# Patient Record
Sex: Female | Born: 1970 | Race: White | Hispanic: No | Marital: Married | Smoking: Current some day smoker
Health system: Southern US, Community
[De-identification: ages and names within clinical notes are randomized; demographics above are authoritative.]

## PROBLEM LIST (undated history)

## (undated) DIAGNOSIS — R1012 Left upper quadrant pain: Secondary | ICD-10-CM

## (undated) DIAGNOSIS — G43909 Migraine, unspecified, not intractable, without status migrainosus: Secondary | ICD-10-CM

## (undated) DIAGNOSIS — K921 Melena: Secondary | ICD-10-CM

## (undated) DIAGNOSIS — K602 Anal fissure, unspecified: Secondary | ICD-10-CM

## (undated) DIAGNOSIS — E119 Type 2 diabetes mellitus without complications: Secondary | ICD-10-CM

## (undated) DIAGNOSIS — K859 Acute pancreatitis without necrosis or infection, unspecified: Secondary | ICD-10-CM

## (undated) DIAGNOSIS — R002 Palpitations: Secondary | ICD-10-CM

## (undated) DIAGNOSIS — N83209 Unspecified ovarian cyst, unspecified side: Secondary | ICD-10-CM

## (undated) DIAGNOSIS — I499 Cardiac arrhythmia, unspecified: Secondary | ICD-10-CM

## (undated) DIAGNOSIS — I10 Essential (primary) hypertension: Secondary | ICD-10-CM

## (undated) DIAGNOSIS — R51 Headache: Secondary | ICD-10-CM

## (undated) DIAGNOSIS — J45909 Unspecified asthma, uncomplicated: Secondary | ICD-10-CM

## (undated) DIAGNOSIS — K589 Irritable bowel syndrome without diarrhea: Secondary | ICD-10-CM

## (undated) DIAGNOSIS — F32A Depression, unspecified: Secondary | ICD-10-CM

## (undated) DIAGNOSIS — R0600 Dyspnea, unspecified: Secondary | ICD-10-CM

## (undated) DIAGNOSIS — R109 Unspecified abdominal pain: Secondary | ICD-10-CM

## (undated) DIAGNOSIS — I209 Angina pectoris, unspecified: Secondary | ICD-10-CM

## (undated) DIAGNOSIS — E785 Hyperlipidemia, unspecified: Secondary | ICD-10-CM

## (undated) HISTORY — DX: Type 2 diabetes mellitus without complications: E11.9

## (undated) HISTORY — DX: Essential (primary) hypertension: I10

## (undated) HISTORY — DX: Left upper quadrant pain: R10.12

## (undated) HISTORY — PX: ESOPHAGOGASTRODUODENOSCOPY: SHX5428

## (undated) HISTORY — DX: Irritable bowel syndrome, unspecified: K58.9

## (undated) HISTORY — DX: Palpitations: R00.2

## (undated) HISTORY — PX: BREAST REDUCTION SURGERY: SHX8

## (undated) HISTORY — DX: Melena: K92.1

## (undated) HISTORY — PX: COLONOSCOPY: SHX174

## (undated) HISTORY — DX: Unspecified asthma, uncomplicated: J45.909

## (undated) HISTORY — DX: Unspecified ovarian cyst, unspecified side: N83.209

## (undated) HISTORY — PX: CHOLECYSTECTOMY: SHX55

## (undated) HISTORY — PX: VAGINAL HYSTERECTOMY: SUR661

## (undated) HISTORY — DX: Migraine, unspecified, not intractable, without status migrainosus: G43.909

## (undated) HISTORY — DX: Anal fissure, unspecified: K60.2

## (undated) HISTORY — DX: Unspecified abdominal pain: R10.9

## (undated) HISTORY — DX: Headache: R51

---
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
## 1995-02-03 HISTORY — PX: REDUCTION MAMMAPLASTY: SUR839

## 1995-02-03 HISTORY — PX: BREAST EXCISIONAL BIOPSY: SUR124

## 2001-09-05 ENCOUNTER — Inpatient Hospital Stay (HOSPITAL_COMMUNITY): Admission: AD | Admit: 2001-09-05 | Discharge: 2001-09-07 | Payer: Self-pay | Admitting: *Deleted

## 2002-02-15 ENCOUNTER — Other Ambulatory Visit: Admission: RE | Admit: 2002-02-15 | Discharge: 2002-02-15 | Payer: Self-pay | Admitting: Obstetrics and Gynecology

## 2002-03-22 ENCOUNTER — Encounter (INDEPENDENT_AMBULATORY_CARE_PROVIDER_SITE_OTHER): Payer: Self-pay

## 2002-03-22 ENCOUNTER — Inpatient Hospital Stay (HOSPITAL_COMMUNITY): Admission: RE | Admit: 2002-03-22 | Discharge: 2002-03-25 | Payer: Self-pay | Admitting: Obstetrics and Gynecology

## 2007-04-04 ENCOUNTER — Emergency Department: Payer: Self-pay | Admitting: Emergency Medicine

## 2008-11-06 ENCOUNTER — Ambulatory Visit: Payer: Self-pay | Admitting: Family Medicine

## 2008-11-06 DIAGNOSIS — F17200 Nicotine dependence, unspecified, uncomplicated: Secondary | ICD-10-CM

## 2008-11-06 DIAGNOSIS — G43909 Migraine, unspecified, not intractable, without status migrainosus: Secondary | ICD-10-CM

## 2008-11-06 HISTORY — DX: Migraine, unspecified, not intractable, without status migrainosus: G43.909

## 2008-11-16 ENCOUNTER — Telehealth: Payer: Self-pay | Admitting: Family Medicine

## 2008-12-04 ENCOUNTER — Telehealth: Payer: Self-pay | Admitting: Family Medicine

## 2008-12-13 ENCOUNTER — Encounter: Payer: Self-pay | Admitting: Family Medicine

## 2008-12-13 ENCOUNTER — Ambulatory Visit: Payer: Self-pay | Admitting: Family Medicine

## 2008-12-13 ENCOUNTER — Other Ambulatory Visit: Admission: RE | Admit: 2008-12-13 | Discharge: 2008-12-13 | Payer: Self-pay | Admitting: Family Medicine

## 2008-12-13 DIAGNOSIS — J309 Allergic rhinitis, unspecified: Secondary | ICD-10-CM | POA: Insufficient documentation

## 2008-12-14 LAB — CONVERTED CEMR LAB
Bilirubin, Direct: 0 mg/dL (ref 0.0–0.3)
Calcium: 9.3 mg/dL (ref 8.4–10.5)
Chloride: 105 meq/L (ref 96–112)
Cholesterol: 223 mg/dL — ABNORMAL HIGH (ref 0–200)
Creatinine, Ser: 0.8 mg/dL (ref 0.4–1.2)
Direct LDL: 119.1 mg/dL
Total Bilirubin: 0.7 mg/dL (ref 0.3–1.2)
Total CHOL/HDL Ratio: 7
Triglycerides: 382 mg/dL — ABNORMAL HIGH (ref 0.0–149.0)
VLDL: 76.4 mg/dL — ABNORMAL HIGH (ref 0.0–40.0)

## 2008-12-18 ENCOUNTER — Encounter (INDEPENDENT_AMBULATORY_CARE_PROVIDER_SITE_OTHER): Payer: Self-pay | Admitting: *Deleted

## 2009-01-09 ENCOUNTER — Ambulatory Visit: Payer: Self-pay | Admitting: Family Medicine

## 2009-01-09 DIAGNOSIS — J019 Acute sinusitis, unspecified: Secondary | ICD-10-CM

## 2009-01-11 ENCOUNTER — Telehealth: Payer: Self-pay | Admitting: Family Medicine

## 2009-02-19 ENCOUNTER — Telehealth: Payer: Self-pay | Admitting: Family Medicine

## 2009-02-21 ENCOUNTER — Encounter: Payer: Self-pay | Admitting: Family Medicine

## 2009-02-21 ENCOUNTER — Ambulatory Visit: Payer: Self-pay | Admitting: Family Medicine

## 2009-02-21 ENCOUNTER — Ambulatory Visit: Payer: Self-pay | Admitting: Cardiology

## 2009-02-21 DIAGNOSIS — R109 Unspecified abdominal pain: Secondary | ICD-10-CM

## 2009-02-21 DIAGNOSIS — N83209 Unspecified ovarian cyst, unspecified side: Secondary | ICD-10-CM

## 2009-02-21 HISTORY — DX: Unspecified abdominal pain: R10.9

## 2009-02-21 HISTORY — DX: Unspecified ovarian cyst, unspecified side: N83.209

## 2009-02-25 ENCOUNTER — Encounter (INDEPENDENT_AMBULATORY_CARE_PROVIDER_SITE_OTHER): Payer: Self-pay | Admitting: *Deleted

## 2009-02-25 ENCOUNTER — Telehealth: Payer: Self-pay | Admitting: Family Medicine

## 2009-04-17 ENCOUNTER — Ambulatory Visit: Payer: Self-pay | Admitting: Gastroenterology

## 2009-04-17 DIAGNOSIS — R1012 Left upper quadrant pain: Secondary | ICD-10-CM

## 2009-04-17 DIAGNOSIS — K219 Gastro-esophageal reflux disease without esophagitis: Secondary | ICD-10-CM | POA: Insufficient documentation

## 2009-04-17 DIAGNOSIS — K921 Melena: Secondary | ICD-10-CM

## 2009-04-17 DIAGNOSIS — K5900 Constipation, unspecified: Secondary | ICD-10-CM | POA: Insufficient documentation

## 2009-04-17 HISTORY — DX: Melena: K92.1

## 2009-04-17 HISTORY — DX: Left upper quadrant pain: R10.12

## 2009-05-20 ENCOUNTER — Telehealth: Payer: Self-pay | Admitting: Gastroenterology

## 2009-06-27 ENCOUNTER — Ambulatory Visit: Payer: Self-pay | Admitting: Family Medicine

## 2009-06-27 LAB — CONVERTED CEMR LAB
CO2: 26 meq/L (ref 19–32)
Chloride: 103 meq/L (ref 96–112)
Sodium: 140 meq/L (ref 135–145)

## 2009-07-04 ENCOUNTER — Telehealth: Payer: Self-pay | Admitting: Family Medicine

## 2009-07-04 ENCOUNTER — Ambulatory Visit: Payer: Self-pay | Admitting: Internal Medicine

## 2009-07-10 ENCOUNTER — Telehealth: Payer: Self-pay | Admitting: Family Medicine

## 2010-01-15 ENCOUNTER — Telehealth: Payer: Self-pay | Admitting: Family Medicine

## 2010-01-16 ENCOUNTER — Ambulatory Visit: Payer: Self-pay | Admitting: Family Medicine

## 2010-01-21 ENCOUNTER — Encounter: Payer: Self-pay | Admitting: Internal Medicine

## 2010-01-22 ENCOUNTER — Telehealth: Payer: Self-pay | Admitting: Family Medicine

## 2010-01-28 ENCOUNTER — Telehealth: Payer: Self-pay | Admitting: Gastroenterology

## 2010-01-31 ENCOUNTER — Telehealth: Payer: Self-pay | Admitting: Family Medicine

## 2010-02-07 ENCOUNTER — Telehealth: Payer: Self-pay | Admitting: Family Medicine

## 2010-02-13 ENCOUNTER — Telehealth: Payer: Self-pay | Admitting: Family Medicine

## 2010-02-18 ENCOUNTER — Encounter (INDEPENDENT_AMBULATORY_CARE_PROVIDER_SITE_OTHER): Payer: Self-pay | Admitting: *Deleted

## 2010-02-21 ENCOUNTER — Ambulatory Visit
Admission: RE | Admit: 2010-02-21 | Discharge: 2010-02-21 | Payer: Self-pay | Source: Home / Self Care | Attending: Gastroenterology | Admitting: Gastroenterology

## 2010-02-21 ENCOUNTER — Telehealth: Payer: Self-pay | Admitting: Family Medicine

## 2010-02-23 ENCOUNTER — Encounter: Payer: Self-pay | Admitting: Family Medicine

## 2010-02-28 ENCOUNTER — Other Ambulatory Visit: Payer: Self-pay | Admitting: Gastroenterology

## 2010-02-28 ENCOUNTER — Ambulatory Visit
Admission: RE | Admit: 2010-02-28 | Discharge: 2010-02-28 | Payer: Self-pay | Source: Home / Self Care | Attending: Gastroenterology | Admitting: Gastroenterology

## 2010-02-28 ENCOUNTER — Telehealth: Payer: Self-pay | Admitting: Gastroenterology

## 2010-02-28 DIAGNOSIS — K625 Hemorrhage of anus and rectum: Secondary | ICD-10-CM

## 2010-02-28 DIAGNOSIS — R109 Unspecified abdominal pain: Secondary | ICD-10-CM

## 2010-03-06 ENCOUNTER — Encounter: Payer: Self-pay | Admitting: Gastroenterology

## 2010-03-06 NOTE — Miscellaneous (Signed)
Summary: gi medication  Clinical Lists Changes  Medications: Added new medication of GLYCOPYRROLATE 1 MG  TABS (GLYCOPYRROLATE) by mouth two times a day as needed abd pain/bloating - Signed Added new medication of OMEPRAZOLE 40 MG  CPDR (OMEPRAZOLE) 1 each day 30 minutes before meal - Signed Rx of GLYCOPYRROLATE 1 MG  TABS (GLYCOPYRROLATE) by mouth two times a day as needed abd pain/bloating;  #68 x 11;  Signed;  Entered by: Eual Fines RN;  Authorized by: Meryl Dare MD [facility];  Method used: [id number] [address], [place], [place]  [address], Ph: [phone] or [phone], Fax: [phone] Rx of OMEPRAZOLE 40 MG  CPDR (OMEPRAZOLE) 1 each day 30 minutes before meal;  #34 x 11;  Signed;  Entered by: Eual Fines RN;  Authorized by: Meryl Dare MD [facility];  Method used: [id number] [address], [place], [place]  [address], Ph: [phone] or [phone], Fax: [phone] Observations: Added new observation of ALLERGY REV: Done (02/28/2010 14:42)    Prescriptions: OMEPRAZOLE 40 MG  CPDR (OMEPRAZOLE) 1 each day 30 minutes before meal  #34 x 11   Entered by:   Eual Fines RN   Authorized by:   Meryl Dare MD [facility]   Signed by:   Eual Fines RN on 02/28/2010   Method used:   [facility]        [id number] ([facility]ail[facility]       [address]       [place], [place]  [address]       Ph: [phone] or [phone]       Fax: [phone]   RxID:   [id number] GLYCOPYRROLATE 1 MG  TABS (GLYCOPYRROLATE) by mouth two times a day as needed abd pain/bloating  #68 x 11   Entered by:   Eual Fines RN   Authorized by:   Meryl Dare MD [facility]   Signed by:   Eual Fines RN on 02/28/2010   Method used:   [facility]        [id number] ([facility]ail[facility]       [address]       [place],  [place]  [address]       Ph: [phone] or [phone]       Fax: [phone]   RxID:   [id number]

## 2010-03-06 NOTE — Progress Notes (Signed)
Summary: vicodin   Phone Note Refill Request Message from:  Patient on February 07, 2010 3:42 PM  Refills Requested: Medication #1:  VICODIN 5-500 MG TABS 1 tab by mouth every 6 hours as needed for pain. Patient only has one pill left and says that she can't wait until monday when Dr. Dayton Martes returns on Monday. [facility] [address]   Initial call taken by: Melody Comas,  February 07, 2010 3:43 PM  Follow-up for Phone Call        Rx called to pharmacy.  Patient notified via telephone. Follow-up by: Linde Gillis CMA Duncan Dull),  February 07, 2010 4:43 PM    Prescriptions: VICODIN 5-500 MG TABS (HYDROCODONE-ACETAMINOPHEN) 1 tab by mouth every 6 hours as needed for pain  #30 x 0   Entered and Authorized by:   Ruthe Mannan MD   Signed by:   Ruthe Mannan MD on 02/07/2010   Method used:   Telephoned to ...       [id number] ([facility]ail[facility]       [address]       [place], [place]  [address]       Ph: [phone] or [phone]       Fax: [phone]   RxID:   [id number]

## 2010-03-06 NOTE — Letter (Signed)
Summary: [facility] Instructions  [facility]  [address] [place], [place] [address]   Phone: [phone]  Fax: [phone]       Kelly Rollins    14-Dec-1970    MRN: [id number]        Procedure Day /Date:  Friday 02/28/2010      Arrival Time:  1:00 pm     Procedure Time: 2:00 pm     Location of Procedure:                    _x _  [facility] (4th Floor)                        PREPARATION FOR COLONOSCOPY WITH MOVIPREP   Starting 5 days prior to your procedure Sunday 1/22 do not eat nuts, seeds, popcorn, corn, beans, peas,  salads, or any raw vegetables.  Do not take any fiber supplements (e.g. Metamucil, Citrucel, and Benefiber).  THE DAY BEFORE YOUR PROCEDURE         DATE: Thursday 1/26  1.  Drink clear liquids the entire day-NO SOLID FOOD  2.  Do not drink anything colored red or purple.  Avoid juices with pulp.  No orange juice.  3.  Drink at least 64 oz. (8 glasses) of fluid/clear liquids during the day to prevent dehydration and help the prep work efficiently.  CLEAR LIQUIDS INCLUDE: Water Jello Ice Popsicles Tea (sugar ok, no milk/cream) Powdered fruit flavored drinks Coffee (sugar ok, no milk/cream) Gatorade Juice: apple, white grape, white cranberry  Lemonade Clear bullion, consomm, broth Carbonated beverages (any kind) Strained chicken noodle soup Hard Candy                             4.  In the morning, mix first dose of MoviPrep solution:    Empty 1 Pouch A and 1 Pouch B into the disposable container    Add lukewarm drinking water to the top line of the container. Mix to dissolve    Refrigerate (mixed solution should be used within 24 hrs)  5.  Begin drinking the prep at 5:00 p.m. The MoviPrep container is divided by 4 marks.   Every 15 minutes drink the solution down to the next mark (approximately 8 oz) until the full liter is complete.   6.  Follow completed prep with 16 oz of clear liquid of your choice  (Nothing red or purple).  Continue to drink clear liquids until bedtime.  7.  Before going to bed, mix second dose of MoviPrep solution:    Empty 1 Pouch A and 1 Pouch B into the disposable container    Add lukewarm drinking water to the top line of the container. Mix to dissolve    Refrigerate  THE DAY OF YOUR PROCEDURE      DATE: Friday 1/27  Beginning at 9:00 a.m. (5 hours before procedure):         1. Every 15 minutes, drink the solution down to the next mark (approx 8 oz) until the full liter is complete.  2. Follow completed prep with 16 oz. of clear liquid of your choice.    3. You may drink clear liquids until 12:00 pm (2 HOURS BEFORE PROCEDURE).   MEDICATION INSTRUCTIONS  Unless otherwise instructed, you should take regular prescription medications with a small sip of water   as early as possible the morning  of your procedure.           OTHER INSTRUCTIONS  You will need a responsible adult at least 39 years of age to accompany you and drive you home.   This person must remain in the waiting room during your procedure.  Wear loose fitting clothing that is easily removed.  Leave jewelry and other valuables at home.  However, you may wish to bring a book to read or  an iPod/MP3 player to listen to music as you wait for your procedure to start.  Remove all body piercing jewelry and leave at home.  Total time from sign-in until discharge is approximately 2-3 hours.  You should go home directly after your procedure and rest.  You can resume normal activities the  day after your procedure.  The day of your procedure you should not:   Drive   Make legal decisions   Operate machinery   Drink alcohol   Return to work  You will receive specific instructions about eating, activities and medications before you leave.    The above instructions have been reviewed and explained to me by   Ezra Sites RN  February 21, 2010 8:26 AM     I fully understand  and can verbalize these instructions _____________________________ Date _________

## 2010-03-06 NOTE — Letter (Signed)
Summary: New Patient letter  [facility]  [address] [place], [place] [address]   Phone: [phone]  Fax: [phone]       02/25/2009 MRN: [id number]  [facility] [address] [place], [place]  [address]  Dear Ms. Landress,  Welcome to the Gastroenterology Division at [facility].    You are scheduled to see Dr.  Russella Dar on 03-21-09 at 9:30AM on the 3rd floor at [facility], [address]  We ask that you try to arrive at our office 15 minutes prior to your appointment time to allow for check-in.  We would like you to complete the enclosed self-administered evaluation form prior to your visit and bring it with you on the day of your appointment.  We will review it with you.  Also, please bring a complete list of all your medications or, if you prefer, bring the medication bottles and we will list them.  Please bring your insurance card so that we may make a copy of it.  If your insurance requires a referral to see a specialist, please bring your referral form from your primary care physician.  Co-payments are due at the time of your visit and may be paid by cash, check or credit card.     Your office visit will consist of a consult with your physician (includes a physical exam), any laboratory testing he/she may order, scheduling of any necessary diagnostic testing (e.g. x-ray, ultrasound, CT-scan), and scheduling of a procedure (e.g. Endoscopy, Colonoscopy) if required.  Please allow enough time on your schedule to allow for any/all of these possibilities.    If you cannot keep your appointment, please call [phone] to cancel or reschedule prior to your appointment date.  This allows [place] the opportunity to schedule an appointment for another patient in need of care.  If you do not cancel or reschedule by 5 p.m. the business day prior to your appointment date, you will be charged a $50.00 late cancellation/no-show fee.    Thank you for  choosing [facility] for your medical needs.  We appreciate the opportunity to care for you.  Please visit [place] at our website  to learn more about our practice.                     Sincerely,                                                             The Gastroenterology Division

## 2010-03-06 NOTE — Assessment & Plan Note (Signed)
Summary: ABD PAIN/CLE   Vital Signs:  Patient profile:   40 year old female Height:      64 inches Weight:      167.13 pounds BMI:     28.79 Temp:     97.7 degrees F oral Pulse rate:   92 / minute Pulse rhythm:   regular BP sitting:   128 / 92  (left arm) Cuff size:   regular  Vitals Entered By: Delilah Shan CMA (AAMA) (February 21, 2009 8:16 AM) CC: Abdominal pain x 3-4 weeks.   History of Present Illness: 40 yo with 2 weeks of severe constipation and abdominal pain. Has been taking MOM, Miralax, and stool softeners, finally had very large BM two days ago but still having severe 8/10 abdominal pain. Pain is mainly LUQ that radiates to LLQ but sometimes radiates to RLQ. She is s/p cholecystectomy. Also has h/o endometriosis with probable surgical abdominal adhesions. Has had some associated nausea, no vomiting. No fevers, chills, blood or mucous in stool. Stools are now loose.  Never had anything like tihs before.  Standing makes her feel better, while sitting in chair makes her feel worse.  Current Medications (verified): 1)  Excedrin Extra Strength 250-250-65 Mg Tabs (Aspirin-Acetaminophen-Caffeine) .... As Needed  - Usually Daily 2)  Multivitamins   Tabs (Multiple Vitamin) .... Take 1 Tablet By Mouth Once A Day 3)  Vitamin D 400 Unit  Tabs (Cholecalciferol) .... Take 1 Tablet By Mouth Once A Day 4)  Vitamin B Complex-C   Caps (B Complex-C) .... Take 1 Tablet By Mouth Once A Day 5)  Veramyst 27.5 Mcg/spray Susp (Fluticasone Furoate) .... 2 Spray Each Nostril Daily. 6)  Claritin 10 Mg Tabs (Loratadine) .Marland Kitchen.. 1 Tab Daily. 7)  Ventolin Hfa 108 (90 Base) Mcg/act Aers (Albuterol Sulfate) .... 2  Puff Every 4-6 Hours As Needed  For Wheezing  Allergies: 1)  ! Penicillin 2)  ! Morphine  Review of Systems      See HPI General:  Denies chills and fever. GI:  Complains of abdominal pain, constipation, and nausea; denies bloody stools, dark tarry stools, and vomiting.  Physical  Exam  General:  Well-developed,well-nourished,in no acute distress; alert,appropriate and cooperative throughout examination. Mouth:  Oral mucosa and oropharynx without lesions or exudates.  Teeth in good repair. Abdomen:  Bowel sounds positive,abdomen soft. TTP over LUQ, otherwise non tender. Murphy's sign neg Psych:  talkative, normally interactive, good eye contact, not anxious appearing, and not depressed appearing.     Impression & Recommendations:  Problem # 1:  ABDOMINAL PAIN OTHER SPECIFIED SITE (ICD-789.09) Assessment New Differential wide, including partial bowel obstruction, diverticulosis/diverticulitis, referred pain (i.e appendix), IBS.  Marland Kitchen  Do not feel Ultrasound will be beneficial given location and wide area of pain. Will send for CT with contrast of Abd/Pelvis. Her updated medication list for this problem includes:    Excedrin Extra Strength 250-250-65 Mg Tabs (Aspirin-acetaminophen-caffeine) .Marland Kitchen... As needed  - usually daily  Orders: Radiology Referral (Radiology)  Complete Medication List: 1)  Excedrin Extra Strength 250-250-65 Mg Tabs (Aspirin-acetaminophen-caffeine) .... As needed  - usually daily 2)  Multivitamins Tabs (Multiple vitamin) .... Take 1 tablet by mouth once a day 3)  Vitamin D 400 Unit Tabs (Cholecalciferol) .... Take 1 tablet by mouth once a day 4)  Vitamin B Complex-c Caps (B complex-c) .... Take 1 tablet by mouth once a day 5)  Veramyst 27.5 Mcg/spray Susp (Fluticasone furoate) .... 2 spray each nostril daily. 6)  Claritin 10  Mg Tabs (Loratadine) .Marland Kitchen.. 1 tab daily. 7)  Ventolin Hfa 108 (90 Base) Mcg/act Aers (Albuterol sulfate) .... 2  puff every 4-6 hours as needed  for wheezing  Patient Instructions: 1)  Great to see you, Victorino Dike. 2)  Please stop by to see Shirlee Limerick on your way out to set up your CT scan.  Current Allergies (reviewed today): ! PENICILLIN ! MORPHINE

## 2010-03-06 NOTE — Progress Notes (Signed)
Summary: wants order for ultrasound  Phone Note Call from Patient Call back at Home Phone [phone]   Caller: Patient Call For: Ruthe Mannan MD Summary of Call: Patient says that she has still been having incredible abdominal pain, today it is really bad. She says that about a year ago she was supposed to go and have an ultrasound done, but wasnt' able to do so because they wanted money upfront and she didn't have it at the time. She has now found a place that is not going to charge her up front. She is asking if you would be willing to send an order to [facility] for her to have this ultrasound done because she is in a great deal of pain this morning and they told her if they had the order faxed to them that they would see her today. Their fax is [phone]. She is also asking if she could possibly get something for the [facility]. Please advise.  Initial call taken by: Melody Comas,  January 15, 2010 8:49 AM  Follow-up for Phone Call        I will place order for pelvic ultrasound as we discussed in May but I cannot call in anything for pain without seeing her.  For abdominal pain, you cannot just treat with pain medication without knowing the cause as you can actually make things worse. Ruthe Mannan MD  January 15, 2010 8:53 AM  Order faxed to [facility]. Patient has an appt to see Dr. Dayton Martes tomorrow morning. Follow-up by: Melody Comas,  January 15, 2010 8:59 AM

## 2010-03-06 NOTE — Progress Notes (Signed)
Summary: Reschedule colonoscopy  Phone Note Call from Patient   Caller: Patient Call For: Dr. Russella Dar Reason for Call: Talk to Nurse Summary of Call: Pt r/s her colonoscopy from 05-21-09 til 07-03-09 b/c she was thinking her appt. was next week. Would you like this pt. charged the cancelation fee? Initial call taken by: Karna Christmas,  May 20, 2009 8:27 AM  Follow-up for Phone Call        05-22-09 We will waive the fee this time, but she will need to cancel with the proper amount of time or she will be charged next time. Follow-up by: Zackery Barefoot,  May 22, 2009 10:34 AM  Additional Follow-up for Phone Call Additional follow up Details #1::        Patient NOT BILLED. Additional Follow-up by: Leanor Kail [facility],  Jun 05, 2009 9:22 AM

## 2010-03-06 NOTE — Assessment & Plan Note (Signed)
Summary: ABD PAIN/YF   History of Present Illness Visit Type: consult Primary GI MD: Elie Goody MD [facility] Primary Provider: Ruthe Mannan, MD Requesting Provider: Ruthe Mannan, MD Chief Complaint: abdominal pain with alternating diarrhea and constipation History of Present Illness:   This is a 40 year old female who relates a several month history of left upper quadrant abdominal pain that occasionally radiates to her left lower quadrant and left flank. Her symptoms are associated with bloating and constipation. She will have phases of constipation and phases of diarrhea. She notes small amounts of rectal bleeding on occasion. She also relates intermittent problems with nausea and vomiting. She has frequent acid reflux problems.   She had an abd and pelvic CT performed that was unremarkable except for a right adnexal cyst. Recent blood work was unremarkable except for a minimally elevated ALT at 36.   GI Review of Systems    Reports abdominal pain, acid reflux, bloating, nausea, and  vomiting.     Location of  Abdominal pain: LUQ.    Denies belching, chest pain, dysphagia with liquids, dysphagia with solids, heartburn, loss of appetite, vomiting blood, weight loss, and  weight gain.      Reports constipation, diarrhea, irritable bowel syndrome, and  rectal bleeding.     Denies anal fissure, black tarry stools, change in bowel habit, diverticulosis, fecal incontinence, heme positive stool, hemorrhoids, jaundice, light color stool, liver problems, and  rectal pain.   Current Medications (verified): 1)  Excedrin Extra Strength 250-250-65 Mg Tabs (Aspirin-Acetaminophen-Caffeine) .... As Needed  - Usually Daily 2)  Multivitamins   Tabs (Multiple Vitamin) .... Take 1 Tablet By Mouth Once A Day 3)  Vitamin D 400 Unit  Tabs (Cholecalciferol) .... Take 1 Tablet By Mouth Once A Day 4)  Vitamin B Complex-C   Caps (B Complex-C) .... Take 1 Tablet By Mouth Once A Day 5)  Claritin 10 Mg Tabs  (Loratadine) .Marland Kitchen.. 1 Tab Daily. 6)  Stool Softener 100 Mg Caps (Docusate Sodium) .... As Needed 7)  Milk of Magnesia 400 Mg/53ml Susp (Magnesium Hydroxide) .... As Needed  Allergies (verified): 1)  ! Penicillin 2)  ! Morphine  Past History:  Past Medical History: Headache G3P3 Anal Fissure Hypertension Irritable Bowel Syndrome Right adenexal cyst  Past Surgical History: Caesarean section Hysterectomy 2004 Cholecystectomy 2001 Bilateral breast reduction 1997  Family History: Mom -alive,  had throat CA in 40s, HLD, HTN as well Dad- alive,  CAD s/p 2 stents, MI at 64, HLD, HTN  Social History: LIves with husband, 3 daughters (46, 42, and 7) and 3 step sons.   Current Smoker Alcohol use-yes Regular exercise-no Self employed as a Designer, industrial/product. Daily Caffeine Use 1-2   Review of Systems       The patient complains of allergy/sinus and urine leakage.         The pertinent positives and negatives are noted as above and in the HPI. All other ROS were reviewed and were negative.   Vital Signs:  Patient profile:   40 year old female Height:      64 inches Weight:      168 pounds BMI:     28.94 Pulse rate:   64 / minute Pulse rhythm:   regular BP sitting:   126 / 82  (left arm) Cuff size:   regular  Vitals Entered By: Francee Piccolo CMA Duncan Dull) (April 17, 2009 3:50 PM)  Physical Exam  General:  Well developed, well nourished, no acute distress.  Head:  Normocephalic and atraumatic. Eyes:  PERRLA, no icterus. Ears:  Normal auditory acuity. Mouth:  No deformity or lesions, dentition normal. Neck:  Supple; no masses or thyromegaly. Lungs:  Clear throughout to auscultation. Heart:  Regular rate and rhythm; no murmurs, rubs,  or bruits. Abdomen:  Soft, nontender and nondistended. No masses, hepatosplenomegaly or hernias noted. Normal bowel sounds. Rectal:  deferred until time of colonoscopy.   Msk:  Symmetrical with no gross deformities. Normal  posture. Pulses:  Normal pulses noted. Extremities:  No clubbing, cyanosis, edema or deformities noted. Neurologic:  Alert and  oriented x4;  grossly normal neurologically. Cervical Nodes:  No significant cervical adenopathy. Inguinal Nodes:  No significant inguinal adenopathy. Psych:  Alert and cooperative. Normal mood and affect.  Impression & Recommendations:  Problem # 1:  ABDOMINAL PAIN-LUQ (ICD-789.02) Symptom conmplex is consistent with irritable bowel syndrome. Rule out underlying inflammatory bowel disease is less likely colorectal neoplasms. Begin Levsin to sublingually q.i.d. as needed.  Problem # 2:  HEMATOCHEZIA (ICD-578.1) Small-volume hematochezia. Rule out hemorrhoids fissure, IBD and colorectal neoplasms. The risks, benefits and alternatives to colonoscopy with possible biopsy and possible polypectomy were discussed with the patient and they consent to proceed. The procedure will be scheduled electively.  Problem # 3:  GERD (ICD-530.81) Probable GERD. Prilosec 20 mg OTC she a.m. and standard antireflux measures. The risks, benefits and alternatives to endoscopy with possible biopsy and possible dilation were discussed with the patient and they consent to proceed. The procedure will be scheduled electively.  Problem # 4:  CONSTIPATION (ICD-564.00) Begin a high-fiber diet with liberal fluid intake. MiraLax once or twice daily, titrated for adequate bowel movements  Other Orders: Colon/Endo (Colon/Endo)  Patient Instructions: 1)  You have been scheduled for your endoscopy and colonoscopy on 05/21/09 @ 3:30 pm. 2)  Please follow a high fiber diet. A brochure has been given to you. 3)  Avoid foods high in acid content ( tomatoes, citrus juices, spicy foods) . Avoid eating within 3 to 4 hours of lying down or before exercising. Do not over eat; try smaller more frequent meals. Elevate head of bed four inches when sleeping.  4)  Take Prilosec OTC which can be gotten without a  prescription. 5)  Pick up Levsin SL at the pharmacy which has been sent as a prescription in addition to your other prescription. 6)  Take Miralax 1 capful dissolved in at least 8 ounces of water or juice and drink once to twice daily (titrate as needed). This can be gotten over the counter. 7)  Copy sent to : Enos Fling, MD 8)  The medication list was reviewed and reconciled.  All changed / newly prescribed medications were explained.  A complete medication list was provided to the patient / caregiver.  Prescriptions: LEVSIN/SL 0.125 MG SUBL (HYOSCYAMINE SULFATE) Dissolve 1 tablet under the tongue as four time daily as needed for abdominal pain  #60 x 5   Entered by:   Christie Nottingham CMA (AAMA)   Authorized by:   Meryl Dare MD [facility]   Signed by:   Christie Nottingham CMA (AAMA) on 04/17/2009   Method used:   [id number]       [address]       [place], [place]  [address]       Ph: [phone] or [phone]  Fax: [phone]   RxID:   [id number] MOVIPREP 100 GM  SOLR (PEG-KCL-NACL-NASULF-NA ASC-C) As per prep instructions.  #1 x 0   Entered by:   Christie Nottingham CMA (AAMA)   Authorized by:   Meryl Dare MD [facility]   Signed by:   Christie Nottingham CMA (AAMA) on 04/17/2009   Method used:   [id number]       [address]       [place], [place]  [address]       Ph: [phone] or [phone]       Fax: [phone]   RxID:   [id number]

## 2010-03-06 NOTE — Miscellaneous (Signed)
Summary: Orders Update  Clinical Lists Changes  Problems: Added new problem of OVARIAN CYST, RIGHT (ICD-620.2) Orders: Added new Referral order of Radiology Referral (Radiology) - Signed

## 2010-03-06 NOTE — Letter (Signed)
Summary: [facility] Instructions  [facility]  [address] [place], [place] [address]   Phone: [phone]  Fax: [phone]       Kelly Rollins    [birth year]    MRN: [id number]        Procedure Day /Date: 05/21/09 (Tuesday)     Arrival Time: 2:30 pm     Procedure Time: 3:30 pm     Location of Procedure:                    _ x_  [facility] (4th Floor)         PREPARATION FOR COLONOSCOPY WITH MOVIPREP   Starting 5 days prior to your procedure 05/14/09 do not eat nuts, seeds, popcorn, corn, beans, peas,  salads, or any raw vegetables.  Do not take any fiber supplements (e.g. Metamucil, Citrucel, and Benefiber).  THE DAY BEFORE YOUR PROCEDURE         DATE: 05/20/09  DAY: Sunday  1.  Drink clear liquids the entire day-NO SOLID FOOD  2.  Do not drink anything colored red or purple.  Avoid juices with pulp.  No orange juice.  3.  Drink at least 64 oz. (8 glasses) of fluid/clear liquids during the day to prevent dehydration and help the prep work efficiently.  CLEAR LIQUIDS INCLUDE: Water Jello Ice Popsicles Tea (sugar ok, no milk/cream) Powdered fruit flavored drinks Coffee (sugar ok, no milk/cream) Gatorade Juice: apple, white grape, white cranberry  Lemonade Clear bullion, consomm, broth Carbonated beverages (any kind) Strained chicken noodle soup Hard Candy                             4.  In the morning, mix first dose of MoviPrep solution:    Empty 1 Pouch A and 1 Pouch B into the disposable container    Add lukewarm drinking water to the top line of the container. Mix to dissolve    Refrigerate (mixed solution should be used within 24 hrs)  5.  Begin drinking the prep at 5:00 p.m. The MoviPrep container is divided by 4 marks.   Every 15 minutes drink the solution down to the next mark (approximately 8 oz) until the full liter is complete.   6.  Follow completed prep with 16 oz of clear liquid of your choice (Nothing red or  purple).  Continue to drink clear liquids until bedtime.  7.  Before going to bed, mix second dose of MoviPrep solution:    Empty 1 Pouch A and 1 Pouch B into the disposable container    Add lukewarm drinking water to the top line of the container. Mix to dissolve    Refrigerate  THE DAY OF YOUR PROCEDURE      DATE: 05/21/09 DAY: Monday  Beginning at  10:30 a.m. (5 hours before procedure):         1. Every 15 minutes, drink the solution down to the next mark (approx 8 oz) until the full liter is complete.  2. Follow completed prep with 16 oz. of clear liquid of your choice.    3. You may drink clear liquids until 1:30 am (2 HOURS BEFORE PROCEDURE).   MEDICATION INSTRUCTIONS  Unless otherwise instructed, you should take regular prescription medications with a small sip of water   as early as possible the morning of your procedure.        OTHER INSTRUCTIONS  You will  need a responsible adult at least 39 years of age to accompany you and drive you home.   This person must remain in the waiting room during your procedure.  Wear loose fitting clothing that is easily removed.  Leave jewelry and other valuables at home.  However, you may wish to bring a book to read or  an iPod/MP3 player to listen to music as you wait for your procedure to start.  Remove all body piercing jewelry and leave at home.  Total time from sign-in until discharge is approximately 2-3 hours.  You should go home directly after your procedure and rest.  You can resume normal activities the  day after your procedure.  The day of your procedure you should not:   Drive   Make legal decisions   Operate machinery   Drink alcohol   Return to work  You will receive specific instructions about eating, activities and medications before you leave.    The above instructions have been reviewed and explained to me by   Christie Nottingham CMA Duncan Dull)  April 17, 2009 4:33 PM     I fully understand and  can verbalize these instructions _____________________________ Date 3/16/11_

## 2010-03-06 NOTE — Miscellaneous (Signed)
Summary: [facility]  Clinical Lists Changes  Medications: Added new medication of MOVIPREP 100 GM  SOLR (PEG-KCL-NACL-NASULF-NA ASC-C) As per prep instructions. - Signed Rx of MOVIPREP 100 GM  SOLR (PEG-KCL-NACL-NASULF-NA ASC-C) As per prep instructions.;  #1 x 0;  Signed;  Entered by: Ezra Sites RN;  Authorized by: Meryl Dare MD [facility];  Method used: [id number] [address] [place], [place]  [address], Ph: [phone] or [phone], Fax: [phone] Observations: Added new observation of ALLERGY REV: Done (02/21/2010 7:57)    Prescriptions: MOVIPREP 100 GM  SOLR (PEG-KCL-NACL-NASULF-NA ASC-C) As per prep instructions.  #1 x 0   Entered by:   Ezra Sites RN   Authorized by:   Meryl Dare MD [facility]   Signed by:   Ezra Sites RN on 02/21/2010   Method used:   [facility]        [id number] ([facility]ail[facility]       [address]       [place], [place]  [address]       Ph: [phone] or [phone]       Fax: [phone]   RxID:   [id number]

## 2010-03-06 NOTE — Progress Notes (Signed)
Summary: refill request for vicodin  Phone Note Refill Request Message from:  Fax from Pharmacy  Refills Requested: Medication #1:  VICODIN 5-500 MG TABS 1 tab by mouth every 6 hours as needed for pain.   Last Refilled: 06/27/2009 [facility] [address], [phone].  Initial call taken by: Lowella Petties CMA,  July 10, 2009 4:17 PM    Prescriptions: VICODIN 5-500 MG TABS (HYDROCODONE-ACETAMINOPHEN) 1 tab by mouth every 6 hours as needed for pain  #30 x 0   Entered and Authorized by:   Ruthe Mannan MD   Signed by:   Ruthe Mannan MD on 07/10/2009   Method used:   Telephoned to ...       [id number] ([facility]ail[facility]       [address]       [place], [place]  [address]       Ph: [phone] or [phone]       Fax: [phone]   RxID:   [id number]   Appended Document: refill request for vicodin Rx called to pharmacy.

## 2010-03-06 NOTE — Progress Notes (Signed)
Summary: wants refill on vicodin  Phone Note Call from Patient Call back at Home Phone [phone]   Caller: Patient Summary of Call: Advised pt of ultrasound results.  She is going to run out of vicodin by the end of the week and is asking if a refill can be sent to [facility]. Initial call taken by: Lowella Petties CMA, AAMA,  January 22, 2010 3:35 PM  Follow-up for Phone Call        Rx Called In Follow-up by: Linde Gillis CMA Duncan Dull),  January 23, 2010 8:35 AM    Prescriptions: VICODIN 5-500 MG TABS (HYDROCODONE-ACETAMINOPHEN) 1 tab by mouth every 6 hours as needed for pain  #30 x 0   Entered and Authorized by:   Ruthe Mannan MD   Signed by:   Ruthe Mannan MD on 01/23/2010   Method used:   Telephoned to ...       [id number]       [address]       [place], [place]  [address]       Ph: [phone] or [phone]       Fax: [phone]   RxID:   [id number]

## 2010-03-06 NOTE — Assessment & Plan Note (Signed)
Summary: STOMACH/CLE   Vital Signs:  Patient profile:   40 year old female Height:      64 inches Weight:      172.75 pounds BMI:     29.76 Temp:     97.6 degrees F oral Pulse rate:   72 / minute Pulse rhythm:   regular BP sitting:   130 / 90  (left arm) Cuff size:   regular  Vitals Entered By: Linde Gillis CMA Duncan Dull) (January 16, 2010 8:07 AM) CC: stomach issues   History of Present Illness: 40 yo here to discuss abdominal pain.  Was seen in January for severe constipation and abdominal pain. Pain was mainly LUQ that radiates to LLQ but sometimes radiates to RLQ. She is s/p cholecystectomy. Also has h/o endometriosis with probable surgical abdominal adhesions. Has had some associated nausea, no vomiting. No fevers, chills, blood or mucous in stool. Referred to GI, felt symptoms consistent with IBS.  Given Bentyl to use as needed. Colonscopy scheduled for June 3rd but she cancelled it because was no longer having changes in her stool.    CT of adomen showed right ovarian cystic structure, was supposed to have pelvic ultrasound but had to cancel  it due to cost.  Also showed left subpleural nodularity, felt to be lymph node.  Was doing ok, had intermittent postprandial epigastric discomfort for last several months but this week, pain became acute again, exactly they way it was this summer.  Mainly LUQ and LLG. Feels at times she can palpate a hard mass in her LUQ.  No longer having issues with constipation.  Pain is worse sitting.  Pelvic and abdominal ultrasound scheduled for this Tuesday.  Current Medications (verified): 1)  Multivitamins   Tabs (Multiple Vitamin) .... Take 1 Tablet By Mouth Once A Day 2)  Vicodin 5-500 Mg Tabs (Hydrocodone-Acetaminophen) .Marland Kitchen.. 1 Tab By Mouth Every 6 Hours As Needed For Pain  Allergies: 1)  ! Penicillin 2)  ! Morphine  Past History:  Past Medical History: Last updated: 04/17/2009 Headache G3P3 Anal Fissure Hypertension Irritable  Bowel Syndrome Right adenexal cyst  Past Surgical History: Last updated: 04/17/2009 Caesarean section Hysterectomy 2004 Cholecystectomy 2001 Bilateral breast reduction 1997  Family History: Last updated: 04/17/2009 Mom -alive,  had throat CA in 40s, HLD, HTN as well Dad- alive,  CAD s/p 2 stents, MI at 40, HLD, HTN  Social History: Last updated: 04/17/2009 LIves with husband, 3 daughters (67, 71, and 7) and 3 step sons.   Current Smoker Alcohol use-yes Regular exercise-no Self employed as a Designer, industrial/product. Daily Caffeine Use 1-2   Risk Factors: Exercise: no (11/06/2008)  Risk Factors: Smoking Status: quit (12/13/2008)  Review of Systems      See HPI General:  Denies fever. GI:  Complains of abdominal pain, indigestion, loss of appetite, and nausea; denies bloody stools and vomiting.  Physical Exam  General:  Well-developed,well-nourished,in no acute distress; alert,appropriate and cooperative throughout examination. Abdomen:  Bowel sounds positive,abdomen soft. TTP over LUQ, otherwise non tender. Murphy's sign neg Extremities:  No clubbing, cyanosis, edema, or deformity noted with normal full range of motion of all joints.   Psych:  talkative, normally interactive, good eye contact, not anxious appearing, and not depressed appearing.     Impression & Recommendations:  Problem # 1:  ABDOMINAL PAIN-LUQ (ICD-789.02) Assessment Deteriorated Etiology unknown- differential is very wide.  Had a abdominal CT in January unremarkable except for left ovarian cystic structure.  Agree she needs to have  her pelvic and abdominal ultrasound but I am not confident this is what is causing the pain.  If abdominal ultrasound does not help [place] with possible cause, will repeat CT scan and send back to GI for further evaluation.  Pt in agreement with plan.  Complete Medication List: 1)  Multivitamins Tabs (Multiple vitamin) .... Take 1 tablet by mouth once a day 2)  Vicodin 5-500  Mg Tabs (Hydrocodone-acetaminophen) .Marland Kitchen.. 1 tab by mouth every 6 hours as needed for pain Prescriptions: VICODIN 5-500 MG TABS (HYDROCODONE-ACETAMINOPHEN) 1 tab by mouth every 6 hours as needed for pain  #30 x 0   Entered and Authorized by:   Ruthe Mannan MD   Signed by:   Ruthe Mannan MD on 01/16/2010   Method used:   Print then Give to Patient   RxID:   [id number]    Orders Added: 1)  Est. Patient Level IV [86578]    Current Allergies (reviewed today): ! PENICILLIN ! MORPHINE

## 2010-03-06 NOTE — Procedures (Addendum)
Summary: Upper Endoscopy  Patient: Kelly Rollins Note: All result statuses are Final unless otherwise noted.  Tests: (1) Upper Endoscopy (EGD)   EGD Upper Endoscopy       DONE     [facility]     [address]     [place], [place]  [address]           ENDOSCOPY PROCEDURE REPORT           PATIENT:  Kelly, Rollins  MR#:  [id number]     BIRTHDATE:  Feb 13, 1970, 39 yrs. old  GENDER:  female     ENDOSCOPIST:  Judie Petit T. Russella Dar, MD, [facility]           PROCEDURE DATE:  02/28/2010     PROCEDURE:  EGD, diagnostic 06301     ASA CLASS:  Class II     INDICATIONS:  abdominal pain, left upper quadr, GERD     MEDICATIONS:  There was residual sedation effect present from     prior procedure.     TOPICAL ANESTHETIC:  none     DESCRIPTION OF PROCEDURE:   After the risks benefits and     alternatives of the procedure were thoroughly explained, informed     consent was obtained.  The [facility] GIF-H180 [id number] endoscope was     introduced through the mouth and advanced to the second portion of     the duodenum, without limitations.  The instrument was slowly     withdrawn as the mucosa was fully examined.     <<PROCEDUREIMAGES>>     Esophagitis was found in the distal esophagus. It was erosive and     linear arrayed. LA Classification Grade A erosive esophagitis.     Otherwise normal esophagus.  The stomach was entered and closely     examined. The pylorus, antrum, angularis, and lesser curvature     were well visualized, including a retroflexed view of the cardia     and fundus. The stomach wall was normally distensable. The scope     passed easily through the pylorus into the duodenum. The duodenal     bulb was normal in appearance, as was the postbulbar duodenum.     Retroflexed views revealed no abnormalities.  The scope was then     withdrawn from the patient and the procedure completed.           COMPLICATIONS:  None           ENDOSCOPIC IMPRESSION:     1) Erosive esophagitis             RECOMMENDATIONS:     1) Anti-reflux regimen long term     2) PPI qam long term: omeprazole 40mg  po qam, #34, 11 refills     3) follow-up: 2 months           Jesse Nosbisch T. Russella Dar, MD, [facility]           CC:  Ruthe Mannan MD           n.     Kelly Rollins at 02/28/2010 02:45 PM           Tanda Rockers, [phone]  Note: An exclamation mark (!) indicates a result that was not dispersed into the flowsheet. Document Creation Date: 02/28/2010 2:45 PM _______________________________________________________________________  (1) Order result status: Final Collection or observation date-time: 02/28/2010 14:29 Requested date-time:  Receipt date-time:  Reported date-time:  Referring Physician:   Ordering Physician: Judie Petit  Russella Dar [id number]) Specimen Source:  Source: Launa Grill Order Number: [id number] Lab site:

## 2010-03-06 NOTE — Progress Notes (Signed)
Summary: Triage  Phone Note Call from Patient Call back at Home Phone [phone]   Caller: Patient Call For: Dr. Russella Dar Reason for Call: Talk to Nurse Summary of Call: Pt wants to speak with nurse because she is still having trouble swallowing and pain under left breast, has a knot there; she was scheduled for double procedure and canceled but now thinks that is what she needs Initial call taken by: [place] Johnson,  January 28, 2010 8:50 AM  Follow-up for Phone Call         Patient was seen in March for abdominal pain.  She was scheduled for a colon/EGD in April that she canceled, she rescheduled for June and also canceled this.  She is still having the same issues, but now has frequent bloating and early satiety.  Is it ok to reschdule her for a ECL directly or do you want to see her in the office?  Patient is aware that Dr Russella Dar is out of the office until 02/03/10. Follow-up by: Darcey Nora RN, CGRN,  January 28, 2010 10:08 AM  Additional Follow-up for Phone Call Additional follow up Details #1::        OK to reschedule COLON/EGD. Additional Follow-up by: Meryl Dare MD [facility],  January 29, 2010 1:02 PM    Additional Follow-up for Phone Call Additional follow up Details #2::    ECL scheduled for 02/28/10 2:00, pre-visit scheduled for 02/21/10 8:00 Follow-up by: Darcey Nora RN, CGRN,  January 29, 2010 2:15 PM

## 2010-03-06 NOTE — Progress Notes (Signed)
Summary: Abdominal pain and constipation  Phone Note Call from Patient Call back at [phone] cell   Caller: Patient Call For: Dr. Ermalene Searing Summary of Call: Pt called earlier and made first available appt with Dr. Dayton Martes on [facility] 02/21/09. Pt having major abdominal pain which goes from sharp to dull pain and is from left rib cage down to left hip bone. Pt said one spot that is just left of the belly button is extremely painful. For the past two days has primarily eaten granola bars and bananas. Pt ate vegetable soup on Sun and she said she was so swollen in her stomach she looked 8 months pregnant. Pt has been drinking liquids. Pt has had problems with constipation in the past but this time is much worse. Pt took some MOM on 02/18/09 with no relief from pain and had very small constipated BM.  Pt took two OTC Advil 200mg  with no relief from pain. Pt wonders if anymed or any thing could be done for pain until see Dr. Dayton Martes on 02/21/09. Pt would like call back at [phone] and if needed uses [phone] as pharmacy. Please advise.  Initial call taken by: Lewanda Rife LPN,  February 19, 2009 3:51 PM  Follow-up for Phone Call        TAke miralax to stimulate bowel movement.Marland Kitchenif pain from constipation this should help pain.Marland Kitchen if no BM, may add enema.  If not imrpoivng call abck to let Dr. Dayton Martes know to see if she can move appt up.  If severe abdominal pain or unable to tolerate fluids, uncontrollable vomiting...go to ER.  Follow-up by: Kerby Nora MD,  February 19, 2009 4:04 PM  Additional Follow-up for Phone Call Additional follow up Details #1::        Patient advised.Consuello Masse CMA  Additional Follow-up by: Benny Lennert CMA Duncan Dull),  February 19, 2009 4:11 PM

## 2010-03-06 NOTE — Progress Notes (Signed)
Summary: triage  Phone Note Call from Patient Call back at Home Phone [phone]   Caller: Elias Else Call For: Dr Russella Dar Reason for Call: Talk to Nurse Summary of Call: Patient wants to speak to nurse scheduled for proc today but is vomitting her prep. Initial call taken by: Tawni Levy,  February 28, 2010 10:40 AM  Follow-up for Phone Call        Returned phone call at 1042 am, no answer on cell phone number left. Message left for patient to return my call at [phone] Follow-up by: Haynes Bast, RN  Additional Follow-up for Phone Call Additional follow up Details #1::        Called pt back again as she did not call here again. Pt wanted me to speak to husband who relates that pt was able to get down all of last evenings prep without difficulty and some of this mornings. She was down to the last 2 cups and she vomited. Husband states that pts last stool was gatorade looking in color and liquid. Encouraged pt to attempt to get last bit of prep down, going slowly and if she felt sick to stop. Told them to keep appointment this afternoon. Additional Follow-up by: Grier Rocher, RN,  February 28, 2010 11:27 AM

## 2010-03-06 NOTE — Progress Notes (Signed)
Summary: refill request for vicodin  Phone Note Refill Request Call back at Home Phone [phone] Message from:  Patient  Refills Requested: Medication #1:  VICODIN 5-500 MG TABS 1 tab by mouth every 6 hours as needed for pain Pt is asking for refills until her procedures next friday.  She has one pill left.  [facility]  She is worried that she will run out and have massive pain.  Initial call taken by: Lowella Petties CMA, AAMA,  February 21, 2010 12:36 PM  Follow-up for Phone Call        Rx called to pharmacy, patient notified via telephone. Follow-up by: Linde Gillis CMA Duncan Dull),  February 21, 2010 12:42 PM    Prescriptions: VICODIN 5-500 MG TABS (HYDROCODONE-ACETAMINOPHEN) 1 tab by mouth every 6 hours as needed for pain  #30 x 0   Entered and Authorized by:   Ruthe Mannan MD   Signed by:   Ruthe Mannan MD on 02/21/2010   Method used:   Telephoned to ...       [id number]       [address]       [place], [place]  [address]       Ph: [phone] or [phone]       Fax: [phone]   RxID:   [id number]

## 2010-03-06 NOTE — Progress Notes (Signed)
Summary: CT results  Phone Note Call from Patient Call back at Home Phone [phone]   Caller: Patient Call For: Dr. Dayton Martes Summary of Call: Patient is anxious for her CT results.  Please advise. Initial call taken by: Delilah Shan CMA Duncan Dull),  July 04, 2009 4:01 PM  Follow-up for Phone Call        Discussed results with pt.   Ruthe Mannan MD  July 04, 2009 6:35 PM

## 2010-03-06 NOTE — Progress Notes (Signed)
  Phone Note Call from Patient   Caller: Patient Summary of Call: Called the patient to schedule her th Pelvic Ultrasound for 6-12 weeks out. Patient asked me to request Dr Dayton Martes refer her to one of our GI Drs with [facility]. She continues to have abdominal pain after eating and would like to discuss this with a GI specialist. Please refer to [place]. Initial call taken by: Carlton Adam,  February 25, 2009 10:51 AM

## 2010-03-06 NOTE — Progress Notes (Signed)
Summary: Update/pain medication  Phone Note Call from Patient Call back at Work Phone [phone]   Caller: Patient Call For: Ruthe Mannan MD Summary of Call: Patient called to let Dr. Dayton Martes know that she has a colonoscopy and endoscopy scheduled for 02/28/2010 with Dr. Russella Dar.  She wants to know if she can get a refill of her pain medications since that appt is still a ways out.  Please advise.  [facility] Initial call taken by: Linde Gillis CMA Duncan Dull),  January 31, 2010 2:37 PM  Follow-up for Phone Call        Rx called to pharmacy, patient notified via message left on cell phone voicemail. Follow-up by: Linde Gillis CMA Duncan Dull),  January 31, 2010 2:46 PM    Prescriptions: VICODIN 5-500 MG TABS (HYDROCODONE-ACETAMINOPHEN) 1 tab by mouth every 6 hours as needed for pain  #30 x 0   Entered and Authorized by:   Ruthe Mannan MD   Signed by:   Ruthe Mannan MD on 01/31/2010   Method used:   Telephoned to ...       [id number]       [address]       [place], [place]  [address]       Ph: [phone] or [phone]       Fax: [phone]   RxID:   [id number]

## 2010-03-06 NOTE — Assessment & Plan Note (Signed)
Summary: abdominal pain/RBH   Vital Signs:  Patient profile:   40 year old female Height:      64 inches Weight:      163.13 pounds BMI:     28.10 Temp:     98.2 degrees F oral Pulse rate:   72 / minute Pulse rhythm:   regular BP sitting:   122 / 82  (right arm) Cuff size:   regular  Vitals Entered By: Linde Gillis CMA Duncan Dull) (Jun 27, 2009 9:24 AM) CC: wants to talk to doctor   History of Present Illness: 40 yo here to discuss abdominal pain.  Was seen in January for severe constipation and abdominal pain. Pain was mainly LUQ that radiates to LLQ but sometimes radiates to RLQ. She is s/p cholecystectomy. Also has h/o endometriosis with probable surgical abdominal adhesions. Has had some associated nausea, no vomiting. No fevers, chills, blood or mucous in stool. Referred to GI, felt symptoms consistent with IBS.  Given Bentyl to use as needed. Colonscopy scheduled for June 3rd.  CT of adomen showed right ovarian cystic structure, was supposed to have pelvic ultrasound but had to reschedule it due to cost.  Also showed left subpleural nodularity, felt to be lymph node.  Pain has changed significantly since she was last here.  Now mainly LUQ, tender to touch on her left chest wall, she can palpate a hard mass.  No longer having issues with constipation.  Pain is worse with expiration, sitting.  Pt is a smoker.   Current Medications (verified): 1)  Multivitamins   Tabs (Multiple Vitamin) .... Take 1 Tablet By Mouth Once A Day 2)  Vicodin 5-500 Mg Tabs (Hydrocodone-Acetaminophen) .Marland Kitchen.. 1 Tab By Mouth Every 6 Hours As Needed For Pain  Allergies: 1)  ! Penicillin 2)  ! Morphine  Review of Systems      See HPI General:  Denies fatigue and fever. Resp:  Denies cough. GI:  Complains of loss of appetite; denies bloody stools, change in bowel habits, indigestion, nausea, and vomiting.  Physical Exam  General:  Well-developed,well-nourished,in no acute distress;  alert,appropriate and cooperative throughout examination. Lungs:  Left lateral chest wall- 3 cm mass tender to palpation. normal respiratory effort, no intercostal retractions, no accessory muscle use, and normal breath sounds.   Heart:  Normal rate and regular rhythm. S1 and S2 normal without gallop, murmur, click, rub or other extra sounds. Psych:  talkative, normally interactive, good eye contact, not anxious appearing, and not depressed appearing.     Impression & Recommendations:  Problem # 1:  ABDOMINAL PAIN-LUQ (ICD-789.02) Assessment Deteriorated Time spent with patient 39 minutes, more than 50% of this time was spent discussing her symptoms and past work up. Cost is an issue and I agree, we can hold off on the colonoscopy at this point since it is so expensive and she is no longer having GI symptoms.  Will get a CT of her chest first to further evaluate this left sided mass.  Also she needs to reschedule her pelvic ultrasound.  if both studies normal, she does need to keep her appointment for colonoscopy on June 3rd.  Also needs to discuss with Dr. Russella Dar before cancelling to get his opinion.  Complete Medication List: 1)  Multivitamins Tabs (Multiple vitamin) .... Take 1 tablet by mouth once a day 2)  Vicodin 5-500 Mg Tabs (Hydrocodone-acetaminophen) .Marland Kitchen.. 1 tab by mouth every 6 hours as needed for pain  Other Orders: Radiology Referral (Radiology) Venipuncture [phone]) TLB-BMP (Basic  Metabolic Panel-BMET) (80048-METABOL)  Patient Instructions: 1)  Please stop by to see Shirlee Limerick on your way out. Prescriptions: VICODIN 5-500 MG TABS (HYDROCODONE-ACETAMINOPHEN) 1 tab by mouth every 6 hours as needed for pain  #30 x 0   Entered and Authorized by:   Ruthe Mannan MD   Signed by:   Ruthe Mannan MD on 06/27/2009   Method used:   Print then Give to Patient   RxID:   [id number]   Current Allergies (reviewed today): ! PENICILLIN ! MORPHINE

## 2010-03-06 NOTE — Progress Notes (Signed)
Summary: vicodin  Phone Note Refill Request Message from:  Fax from Pharmacy on February 13, 2010 2:22 PM  Refills Requested: Medication #1:  VICODIN 5-500 MG TABS 1 tab by mouth every 6 hours as needed for pain.   Last Refilled: 02/07/2010 [phone]  Initial call taken by: Melody Comas,  February 13, 2010 2:22 PM  Follow-up for Phone Call        [facility], can you please call pt to see why she still needs this? Ruthe Mannan MD  February 13, 2010 2:29 PM  Left message on cell phone voicemail for patient to return call.  Linde Gillis CMA Duncan Dull)  February 13, 2010 2:40 PM   Patient states that she doesn't see Dr. Russella Dar until 02/28/2010.  She says that this pain medication is the only thing that works and allows her to work, be a mom, and function daily.  She says that she thought we had the understanding that we would refill this medication until her appt.  She also says that she runs out over the weekend.  Linde Gillis CMA Duncan Dull)  February 13, 2010 3:01 PM     Additional Follow-up for Phone Call Additional follow up Details #1::        ok to refill until appointment, no further refills. Ruthe Mannan MD  February 13, 2010 3:04 PM  Rx called to pharmacy, patient notified as instructed via telephone.  Additional Follow-up by: Linde Gillis CMA Duncan Dull),  February 13, 2010 3:17 PM    Prescriptions: VICODIN 5-500 MG TABS (HYDROCODONE-ACETAMINOPHEN) 1 tab by mouth every 6 hours as needed for pain  #30 x 0   Entered and Authorized by:   Ruthe Mannan MD   Signed by:   Ruthe Mannan MD on 02/13/2010   Method used:   Telephoned to ...       [id number]       [address]       [place], [place]  [address]       Ph: [phone] or [phone]       Fax: [phone]   RxID:   [id number]

## 2010-03-06 NOTE — Procedures (Addendum)
Summary: Colonoscopy  Patient: Kelly Rollins Note: All result statuses are Final unless otherwise noted.  Tests: (1) Colonoscopy (COL)   COL Colonoscopy           DONE     [facility]     [address]     [place], [place]  [address]           COLONOSCOPY PROCEDURE REPORT           PATIENT:  Kelly, Rollins  MR#:  [id number]     BIRTHDATE:  [birth year], 39 yrs. old  GENDER:  female     ENDOSCOPIST:  Judie Petit T. Russella Dar, MD, [facility]           PROCEDURE DATE:  02/28/2010     PROCEDURE:  Colonoscopy with biopsy and snare polypectomy     ASA CLASS:  Class II     INDICATIONS:  1) hematochezia  2) abdominal pain     MEDICATIONS:   Fentanyl 75 mcg IV, Versed 8 mg IV     DESCRIPTION OF PROCEDURE:   After the risks benefits and     alternatives of the procedure were thoroughly explained, informed     consent was obtained.  Digital rectal exam was performed and     revealed external hemorrhoids., small. The LB [id number]-[id number]     endoscope was introduced through the anus and advanced to the     terminal ileum which was intubated for a short distance, without     limitations.  The quality of the prep was excellent, using     MoviPrep.  The instrument was then slowly withdrawn as the colon     was fully examined.     <<PROCEDUREIMAGES>>     FINDINGS:  A sessile polyp was found in the ascending colon. It     was 7 mm in size. Polyp was snared, then cauterized with monopolar     cautery. Retrieval was successful. A sessile polyp was found in     the rectum. It was 3 mm in size. The polyp was removed using cold     biopsy forceps.  The terminal ileum appeared normal.  A normal     appearing cecum, ileocecal valve, and appendiceal orifice were     identified. The hepatic flexure, transverse, splenic flexure,     descending, sigmoid colon appeared unremarkable. Retroflexed views     in the rectum revealed no abnormalities. The time to cecum =  2.25     minutes. The scope was then  withdrawn (time =  10.5  min) from the     patient and the procedure completed.           COMPLICATIONS:  None           ENDOSCOPIC IMPRESSION:     1) 7 mm sessile polyp in the ascending colon     2) 3 mm sessile polyp in the rectum           RECOMMENDATIONS:     1) Hold aspirin, aspirin products, and anti-inflamatory     medication for 2 weeks.     2) Await pathology results     3) If the polyps are adenomatous (pre-cancerous), colonoscopy in     5 years. Otherwise follow colorectal cancer screening guidelines     for "routine risk" patients with colonoscopy ata ge 50.     4) Suspected IBS: glycopyrrolate 1mg  po bid prn abd pain/bloating,     #68,  11 refills           Daylynn Stumpp T. Russella Dar, MD, [facility]           CC:  Ruthe Mannan MD           n.     Rosalie DoctorVenita Lick. Jowanna Loeffler at 02/28/2010 02:38 PM           Tanda Rockers, [phone]  Note: An exclamation mark (!) indicates a result that was not dispersed into the flowsheet. Document Creation Date: 02/28/2010 2:38 PM _______________________________________________________________________  (1) Order result status: Final Collection or observation date-time: 02/28/2010 14:19 Requested date-time:  Receipt date-time:  Reported date-time:  Referring Physician:   Ordering Physician: Claudette Head [phone]) Specimen Source:  Source: Launa Grill Order Number: [id number] Lab site:   Appended Document: Colonoscopy     Procedures Next Due Date:    Colonoscopy: 11/2021

## 2010-03-07 ENCOUNTER — Other Ambulatory Visit: Payer: Self-pay | Admitting: Family Medicine

## 2010-03-07 ENCOUNTER — Ambulatory Visit (INDEPENDENT_AMBULATORY_CARE_PROVIDER_SITE_OTHER): Payer: 59 | Admitting: Family Medicine

## 2010-03-07 ENCOUNTER — Encounter: Payer: Self-pay | Admitting: Family Medicine

## 2010-03-07 DIAGNOSIS — R1012 Left upper quadrant pain: Secondary | ICD-10-CM

## 2010-03-10 ENCOUNTER — Ambulatory Visit: Payer: Self-pay | Admitting: Family Medicine

## 2010-03-12 NOTE — Letter (Addendum)
Summary: Patient Notice-Hyperplastic Polyps  [facility]  [address] [place], [place] [address]   Phone: [phone]  Fax: [phone]        March 06, 2010 MRN: [id number]    [facility] [address] [place], [place]  [address]    Dear Ms. Morado,  I am pleased to inform you that the colon polyp(s) removed during your recent colonoscopy was (were) found to be hyperplastic. These types of polyps are NOT pre-cancerous.  It is my recommendation that you have a repeat colonoscopy examination at age 35 for routine colorectal cancer screening.  Should you develop new or worsening symptoms of abdominal pain, bowel habit changes or bleeding from the rectum or bowels, please schedule an evaluation with either your primary care physician or with me.  Continue treatment plan as outlined the day of your exam.  Please call us if you are having persistent problems or have questions about your condition that have not been fully answered at this time.  Sincerely,  Meryl Dare MD [facility]  This letter has been electronically signed by your physician.  Appended Document: Patient Notice-Hyperplastic Polyps LETTER MAILED

## 2010-03-12 NOTE — Assessment & Plan Note (Signed)
Summary: Discuss colonoscopy   Vital Signs:  Patient profile:   40 year old female Height:      64 inches Weight:      171.75 pounds BMI:     29.59 Temp:     98.1 degrees F oral Pulse rate:   76 / minute Pulse rhythm:   regular BP sitting:   130 / 90  (left arm) Cuff size:   regular  Vitals Entered By: Linde Gillis CMA Duncan Dull) (March 07, 2010 11:29 AM) CC: discuss colonoscopy   History of Present Illness: 40 yo here to discuss follow up abdominal pain.  Was seen in January for severe constipation and abdominal pain. Pain was mainly LUQ that radiates to LLQ but sometimes radiates to RLQ. She is s/p cholecystectomy. Also has h/o endometriosis with probable surgical abdominal adhesions. Has had some associated nausea, no vomiting. No fevers, chills, blood or mucous in stool. Referred to GI, felt symptoms consistent with IBS.  Given Bentyl to use as needed. Colonscopy scheduled for June 3rd but she cancelled it because was no longer having changes in her stool.    CT of adomen showed right ovarian cystic structure,follow up ultrasound was negative. Also showed left subpleural nodularity, felt to be lymph node.  Referred to GI- EGD showed erosive esphagitis.  Placed on Omprazole 40 mg daily and Glycopyrrolate 1 mg two times a day for abdomina pain/bloating.  Feels symptoms not improved. Colonscopy showed polyps.  She is very tearful today because she is still having daily, sharp pain in LUQ.  Unrelated to food.  Worsened by sitting.  Wakes her up in the middle of the night.  Feels palpable knots in this area.  Current Medications (verified): 1)  Glycopyrrolate 1 Mg  Tabs (Glycopyrrolate) .... By Mouth Two Times A Day As Needed Abd Pain/bloating 2)  Omeprazole 40 Mg  Cpdr (Omeprazole) .Marland Kitchen.. 1 Each Day 30 Minutes Before Meal 3)  Vicodin 5-500 Mg Tabs (Hydrocodone-Acetaminophen) .Marland Kitchen.. 1 Tab By Mouth Every 6 Hours As Needed For Pain.  Allergies: 1)  ! Penicillin 2)  !  Morphine  Past History:  Past Medical History: Last updated: 04/17/2009 Headache G3P3 Anal Fissure Hypertension Irritable Bowel Syndrome Right adenexal cyst  Past Surgical History: Last updated: 04/17/2009 Caesarean section Hysterectomy 2004 Cholecystectomy 2001 Bilateral breast reduction 1997  Family History: Last updated: 04/17/2009 Mom -alive,  had throat CA in 40s, HLD, HTN as well Dad- alive,  CAD s/p 2 stents, MI at 83, HLD, HTN  Social History: Last updated: 04/17/2009 LIves with husband, 3 daughters (49, 23, and 7) and 3 step sons.   Current Smoker Alcohol use-yes Regular exercise-no Self employed as a Designer, industrial/product. Daily Caffeine Use 1-2   Risk Factors: Exercise: no (11/06/2008)  Risk Factors: Smoking Status: quit (12/13/2008)  Review of Systems      See HPI General:  Denies fever. GI:  Denies diarrhea, nausea, and vomiting.  Physical Exam  General:  Well-developed,well-nourished,in no acute distress; alert,appropriate and cooperative throughout examination. Mouth:  Oral mucosa and oropharynx without lesions or exudates.  Teeth in good repair. Abdomen:  Bowel sounds positive,abdomen soft. TTP over LUQ, otherwise non tender. I cannot feel any masses Psych:  talkative, normally interactive, good eye contact, not anxious appearing, and not depressed appearing.     Impression & Recommendations:  Problem # 1:  ABDOMINAL PAIN-LUQ (ICD-789.02) Assessment Deteriorated At this point, I am at a loss for what is causing her pain. I do not feel she  is malignering.  Up to this point, imaging has not truly pointed to a cause.   ?possible pain from the small subpleural node? At this point, will get an MRI to hopefully rule out nerve, musclar, bone abnormalities since she can actually palpate masses at times. Orders: Radiology Referral (Radiology)  Complete Medication List: 1)  Glycopyrrolate 1 Mg Tabs (Glycopyrrolate) .... By mouth two times a  day as needed abd pain/bloating 2)  Omeprazole 40 Mg Cpdr (Omeprazole) .Marland Kitchen.. 1 each day 30 minutes before meal 3)  Vicodin 5-500 Mg Tabs (Hydrocodone-acetaminophen) .Marland Kitchen.. 1 tab by mouth every 6 hours as needed for pain.  Patient Instructions: 1)  Please stop by to see Shirlee Limerick on your way out. Prescriptions: VICODIN 5-500 MG TABS (HYDROCODONE-ACETAMINOPHEN) 1 tab by mouth every 6 hours as needed for pain.  #60 x 0   Entered and Authorized by:   Ruthe Mannan MD   Signed by:   Ruthe Mannan MD on 03/07/2010   Method used:   Print then Give to Patient   RxID:   [id number]    Orders Added: 1)  Radiology Referral [Radiology] 2)  Est. Patient Level IV [[address]]    Current Allergies (reviewed today): ! PENICILLIN ! MORPHINE

## 2010-03-13 ENCOUNTER — Ambulatory Visit
Admission: RE | Admit: 2010-03-13 | Discharge: 2010-03-13 | Disposition: A | Discharge status: Home / Self Care | Payer: 59 | Source: Ambulatory Visit | Attending: Family Medicine | Admitting: Family Medicine

## 2010-03-13 DIAGNOSIS — R1012 Left upper quadrant pain: Secondary | ICD-10-CM

## 2010-03-19 ENCOUNTER — Encounter (INDEPENDENT_AMBULATORY_CARE_PROVIDER_SITE_OTHER): Payer: Self-pay | Admitting: *Deleted

## 2010-03-19 ENCOUNTER — Other Ambulatory Visit: Payer: Self-pay | Admitting: Family Medicine

## 2010-03-19 ENCOUNTER — Other Ambulatory Visit (INDEPENDENT_AMBULATORY_CARE_PROVIDER_SITE_OTHER): Payer: 59

## 2010-03-19 ENCOUNTER — Other Ambulatory Visit: Payer: 59

## 2010-03-19 DIAGNOSIS — E785 Hyperlipidemia, unspecified: Secondary | ICD-10-CM

## 2010-03-19 DIAGNOSIS — E7800 Pure hypercholesterolemia, unspecified: Secondary | ICD-10-CM | POA: Insufficient documentation

## 2010-03-19 LAB — HEPATIC FUNCTION PANEL
ALT: 35 U/L (ref 0–35)
Alkaline Phosphatase: 52 U/L (ref 39–117)
Bilirubin, Direct: 0.1 mg/dL (ref 0.0–0.3)
Total Bilirubin: 0.4 mg/dL (ref 0.3–1.2)
Total Protein: 6.7 g/dL (ref 6.0–8.3)

## 2010-03-19 LAB — BASIC METABOLIC PANEL
BUN: 12 mg/dL (ref 6–23)
Chloride: 103 mEq/L (ref 96–112)
Creatinine, Ser: 0.8 mg/dL (ref 0.4–1.2)
Glucose, Bld: 98 mg/dL (ref 70–99)
Potassium: 4.8 mEq/L (ref 3.5–5.1)

## 2010-03-19 LAB — LDL CHOLESTEROL, DIRECT: Direct LDL: 56.1 mg/dL

## 2010-03-19 LAB — LIPID PANEL
HDL: 25 mg/dL — ABNORMAL LOW (ref >39.00)
VLDL: 284.8 mg/dL — ABNORMAL HIGH (ref 0.0–40.0)

## 2010-03-20 ENCOUNTER — Telehealth: Payer: Self-pay | Admitting: Family Medicine

## 2010-03-26 ENCOUNTER — Telehealth: Payer: Self-pay | Admitting: Family Medicine

## 2010-03-26 NOTE — Progress Notes (Signed)
Summary: cant get meds prescribed this morning  Phone Note Call from Patient Call back at Home Phone [phone]   Caller: Patient Call For: Ruthe Mannan MD Summary of Call: Pt was given niaspan and tricor this morning.  The niaspan is too cosly and the tricor is not covered by her insurance.  She is asking for a generic alternative to tricor.  I called her pharmacy and her insurance prefers fenofibrate. They prefer a statin over niaspan.   [facility]. Initial call taken by: Lowella Petties CMA, AAMA,  March 20, 2010 11:23 AM  Follow-up for Phone Call        statins do not reduce triglycerides the way that niacin dose.  We can try a statin but I am afraid we will not achieve the same lowering goal.  Niacin is also over the counter if she is willing to go that route.   I will send in an rx for fenofibrate and crestor. Pt needs to be aware of increased risk of myopathy or muscle pain/break down when you combine fenofibrate with a statin. Ruthe Mannan MD  March 20, 2010 11:31 AM  Left message on machine for patient to return call.  Linde Gillis CMA Duncan Dull)  March 20, 2010 2:32 PM   Patient advised as instructed via telephone.  She wants to keep the Crestor but get Niacin OTC.  What dose should she take of the Niacin daily?  Please advise.  Linde Gillis CMA Duncan Dull)  March 20, 2010 4:46 PM    Additional Follow-up for Phone Call Additional follow up Details #1::        There are sustained released forms and immediate release over the counter so dosage depends on what she picks up from pharmacy.  She can either ask the pharmacist or call us back once she sees which formulation they carry OTC. Ruthe Mannan MD  March 21, 2010 8:34 AM  Patient advised as instructed via telephone.  She will check at [facility] pharmacy and see what they have and call us back on Monday.  Additional Follow-up by: Linde Gillis CMA Duncan Dull),  March 21, 2010 9:23 AM    New/Updated  Medications: FENOFIBRATE 160 MG TABS (FENOFIBRATE) 1 tab by mouth daily. CRESTOR 10 MG TABS (ROSUVASTATIN CALCIUM) 1 tablet by mouth daily Prescriptions: CRESTOR 10 MG TABS (ROSUVASTATIN CALCIUM) 1 tablet by mouth daily  #90 x 3   Entered and Authorized by:   Ruthe Mannan MD   Signed by:   Ruthe Mannan MD on 03/20/2010   Method used:   [id number]       [address]       [place], [place]  [address]       Ph: [phone] or [phone]       Fax: [phone]   RxID:   [id number] FENOFIBRATE 160 MG TABS (FENOFIBRATE) 1 tab by mouth daily.  #30 x 3   Entered and Authorized by:   Ruthe Mannan MD   Signed by:   Ruthe Mannan MD on 03/20/2010   Method used:   [facility]        [id number]       [address] [facility], [place]  [address]  Ph: [phone] or [phone]       Fax: [phone]   RxID:   [id number]

## 2010-03-27 ENCOUNTER — Encounter: Payer: Self-pay | Admitting: Family Medicine

## 2010-03-28 ENCOUNTER — Telehealth: Payer: Self-pay | Admitting: Family Medicine

## 2010-04-01 ENCOUNTER — Telehealth: Payer: Self-pay | Admitting: Family Medicine

## 2010-04-01 NOTE — Progress Notes (Addendum)
Summary: prior Berkley Harvey is needed for tricor  Phone Note From Pharmacy   [facility] [phone] */ [facility] Summary of Call: Prior Berkley Harvey is needed for tricor, forms are on your desk.              Lowella Petties CMA, AAMA  March 26, 2010 3:28 PM   Follow-up for Phone Call        In my box. Ruthe Mannan MD  March 27, 2010 7:19 AM  Form faxed to [facility].  Follow-up by: Linde Gillis CMA Duncan Dull),  March 27, 2010 10:46 AM     Appended Document: prior Berkley Harvey is needed for tricor Prior auth given for tricor, advised pharmacy.  Co-pay is still $200.00 so pt will probably want to change.  Approval letter placed on doctor's desk for signature and scanning.

## 2010-04-02 ENCOUNTER — Ambulatory Visit (INDEPENDENT_AMBULATORY_CARE_PROVIDER_SITE_OTHER): Payer: 59 | Admitting: Family Medicine

## 2010-04-02 ENCOUNTER — Encounter: Payer: Self-pay | Admitting: Family Medicine

## 2010-04-02 DIAGNOSIS — K7689 Other specified diseases of liver: Secondary | ICD-10-CM | POA: Insufficient documentation

## 2010-04-02 DIAGNOSIS — R1012 Left upper quadrant pain: Secondary | ICD-10-CM

## 2010-04-02 DIAGNOSIS — E7800 Pure hypercholesterolemia, unspecified: Secondary | ICD-10-CM

## 2010-04-03 ENCOUNTER — Encounter (INDEPENDENT_AMBULATORY_CARE_PROVIDER_SITE_OTHER): Payer: Self-pay | Admitting: *Deleted

## 2010-04-03 ENCOUNTER — Other Ambulatory Visit: Payer: 59

## 2010-04-03 ENCOUNTER — Other Ambulatory Visit (INDEPENDENT_AMBULATORY_CARE_PROVIDER_SITE_OTHER): Payer: 59

## 2010-04-03 ENCOUNTER — Other Ambulatory Visit: Payer: Self-pay | Admitting: Family Medicine

## 2010-04-03 DIAGNOSIS — E785 Hyperlipidemia, unspecified: Secondary | ICD-10-CM

## 2010-04-03 LAB — HEPATIC FUNCTION PANEL
Alkaline Phosphatase: 52 U/L (ref 39–117)
Bilirubin, Direct: 0.1 mg/dL (ref 0.0–0.3)
Total Bilirubin: 0.3 mg/dL (ref 0.3–1.2)

## 2010-04-03 LAB — LIPID PANEL
HDL: 29.5 mg/dL — ABNORMAL LOW (ref >39.00)
Total CHOL/HDL Ratio: 6
VLDL: 147.6 mg/dL — ABNORMAL HIGH (ref 0.0–40.0)

## 2010-04-03 LAB — LIPASE: Lipase: 25 U/L (ref 11.0–59.0)

## 2010-04-03 LAB — LDL CHOLESTEROL, DIRECT: Direct LDL: 72.6 mg/dL

## 2010-04-07 ENCOUNTER — Telehealth: Payer: Self-pay | Admitting: Family Medicine

## 2010-04-07 ENCOUNTER — Ambulatory Visit: Payer: 59 | Admitting: Family Medicine

## 2010-04-09 ENCOUNTER — Ambulatory Visit (INDEPENDENT_AMBULATORY_CARE_PROVIDER_SITE_OTHER): Payer: 59 | Admitting: Family Medicine

## 2010-04-09 ENCOUNTER — Encounter: Payer: Self-pay | Admitting: Family Medicine

## 2010-04-09 DIAGNOSIS — E7800 Pure hypercholesterolemia, unspecified: Secondary | ICD-10-CM

## 2010-04-10 NOTE — Progress Notes (Signed)
Summary: vicodin  Phone Note Refill Request Message from:  Fax from Pharmacy on April 01, 2010 10:20 AM  Refills Requested: Medication #1:  VICODIN 5-500 MG TABS 1 tab by mouth every 6 hours as needed for pain.   Last Refilled: 03/20/2010 [facility] [address] [phone]  Initial call taken by: Melody Comas,  April 01, 2010 10:20 AM  Follow-up for Phone Call        denied.  Just received 60 tablets on 03/20/2010.  She should not be taking this many Vicodin at this point. Ruthe Mannan MD  April 01, 2010 10:28 AM  Patient says that she has been in so much pain, she has a really hard time over the weekend and is also waking up during the night to take. She says that there have been times that she has had to take another one within an hour after taking one because the pain is so bad.  Follow-up by: Melody Comas,  April 01, 2010 10:49 AM  Additional Follow-up for Phone Call Additional follow up Details #1::        This is way too much vicodin and i cannot refill her medication.  I'm very sorry.  If she would like for me to refer her back to GI to try to figure out why she is in so much pain, we can do that.  Elevated triglyerides and fatty liver cannot account for this much pain. Ruthe Mannan MD  April 01, 2010 4:39 PM  Patient notified.   Additional Follow-up by: Melody Comas,  April 01, 2010 4:41 PM     Appended Document: vicodin I am concerned that her pain is from pancreatitis from her increased triglycerides.  Please make sure she keeps her appt to come see me on Weds so we can recheck labs.  Use her vicodin very sparingly because it is metabolized in the liver.  Does she have any tablets left?  Appended Document: vicodin actually, since her pain is so severe,  please have her come in tomorrow morning for fasting labs- fasting lipid panel, lipase, hepatic panel (272.2).  Appended Document: vicodin just called pt and left voicemail to return my  call.  please still try to call in again in morning. I also immediately want to add Omega 3 fatty acid fish oil (with EPA)- 4 grams daily.  OTC is much cheaper than Lovaza but I would be happy to send in Lovaza Rx because it is a great fish oil medication. (sorry for all of the appends)  Appended Document: vicodin Patient is coming in this morning to see Dr. Dayton Martes.  She states that her pain is still very bad and she thinks she may be going through withdrawals.  Appended Document: vicodin ok please make sure she is on the schedule.  Appended Document: vicodin Put her on the schedule for 12:30 but advised her to come in right away.

## 2010-04-10 NOTE — Medication Information (Signed)
Summary: PA & Approval for Tricor  PA & Approval for Tricor   Imported By: Maryln Gottron 04/02/2010 11:00:28  _____________________________________________________________________  External Attachment:    Type:   Image     Comment:   External Document

## 2010-04-10 NOTE — Progress Notes (Signed)
Summary: Update on Niacin  Phone Note Call from Patient Call back at Home Phone [phone]   Caller: Patient Call For: Ruthe Mannan MD Summary of Call: Spoke with patient and she stated that her pharmacy told her that there is a Rx at the pharmacy for Tricor.  Patient is not on Tricor but Crestor.  I will call the pharmacy to let them know that she is no longer on Crestor.  She also stated that the Niacin is not extended release.  She states she is not experiencing any problem on the Niacin.  She will see [place] on 04/03/2010 at her f/u appt. Initial call taken by: Linde Gillis CMA Duncan Dull),  March 28, 2010 4:31 PM  Follow-up for Phone Call        so is she taking the Crestor and the Niacin? Ruthe Mannan MD  March 31, 2010 7:41 AM   Yes.  Linde Gillis CMA Duncan Dull)  March 31, 2010 7:59 AM

## 2010-04-10 NOTE — Assessment & Plan Note (Signed)
Summary: still in pain/nt   Vital Signs:  Patient profile:   40 year old female Height:      64 inches Weight:      171.50 pounds BMI:     29.54 Temp:     97.9 degrees F oral Pulse rate:   76 / minute Pulse rhythm:   regular BP sitting:   140 / 100  (left arm) Cuff size:   regular  Vitals Entered By: Linde Gillis CMA Duncan Dull) (April 02, 2010 8:28 AM) CC: still in pain   History of Present Illness: 40 yo here to discuss abdominal pain.  Was seen in January for severe constipation and abdominal pain. Pain was mainly LUQ that radiates to LLQ but sometimes radiates to RLQ. She is s/p cholecystectomy. Also has h/o endometriosis with probable surgical abdominal adhesions. Has had some associated nausea, no vomiting. No fevers, chills, blood or mucous in stool. Referred to GI, felt symptoms consistent with IBS.  Given Bentyl to use as needed.   EGD showed erosive esphagitis.  Placed on Omprazole 40 mg daily and Glycopyrrolate 1 mg two times a day for abdomina pain/bloating.  Feels symptoms not improved. Colonscopy showed polyps.  CT of adomen showed right ovarian cystic structure,follow up ultrasound was negative. Also showed left subpleural nodularity, felt to be lymph node.  MRI showed fatty liver, otherwise unremarkable.  She has been taking several Vicodin per day to alleviate the pain.    TG increased to 1424 this month from 382 in 11/10.  We started Tricor but she could not afford it and Niacin.  Since she could not afford Tricor, we added Crestor as welll.     Current Medications (verified): 1)  Glycopyrrolate 1 Mg  Tabs (Glycopyrrolate) .... By Mouth Two Times A Day As Needed Abd Pain/bloating 2)  Omeprazole 40 Mg  Cpdr (Omeprazole) .Marland Kitchen.. 1 Each Day 30 Minutes Before Meal 3)  Vicodin 5-500 Mg Tabs (Hydrocodone-Acetaminophen) .Marland Kitchen.. 1 Tab By Mouth Every 6 Hours As Needed For Pain. 4)  Niaspan 500 Mg Tbcr (Niacin (Antihyperlipidemic)) .Marland Kitchen.. 1 Tab By Mouth At Bedtime X 4  Weeks, Then 2 Tabs By Mouth At Bedtime 5)  Fenofibrate 160 Mg Tabs (Fenofibrate) .Marland Kitchen.. 1 Tab By Mouth Daily. 6)  Crestor 10 Mg Tabs (Rosuvastatin Calcium) .Marland Kitchen.. 1 Tablet By Mouth Daily 7)  Trilipix 135 Mg Cpdr (Choline Fenofibrate) .Marland Kitchen.. 1 Tab By Mouth Daily.  Allergies: 1)  ! Penicillin 2)  ! Morphine  Past History:  Past Medical History: Last updated: 04/17/2009 Headache G3P3 Anal Fissure Hypertension Irritable Bowel Syndrome Right adenexal cyst  Past Surgical History: Last updated: 04/17/2009 Caesarean section Hysterectomy 2004 Cholecystectomy 2001 Bilateral breast reduction 1997  Family History: Last updated: 04/17/2009 Mom -alive,  had throat CA in 40s, HLD, HTN as well Dad- alive,  CAD s/p 2 stents, MI at 89, HLD, HTN  Social History: Last updated: 04/17/2009 [place] with husband, 3 daughters (99, 54, and 7) and 3 step sons.   Current Smoker Alcohol use-yes Regular exercise-no Self employed as a Designer, industrial/product. Daily Caffeine Use 1-2   Risk Factors: Exercise: no (11/06/2008)  Risk Factors: Smoking Status: quit (12/13/2008)  Review of Systems      See HPI General:  Denies fever. GI:  Complains of abdominal pain and nausea; denies change in bowel habits and vomiting.  Physical Exam  General:  Well-developed,well-nourished,in no acute distress; alert,appropriate and cooperative throughout examination. Abdomen:  Bowel sounds positive,abdomen soft. reamins tender over TTP over LUQ, otherwise  non tender. I cannot feel any masses, no hepatomegaly.   Psych:  talkative, normally interactive, good eye contact, not anxious appearing, and not depressed appearing.     Impression & Recommendations:  Problem # 1:  HYPERCHOLESTEROLEMIA (ICD-272.0) Assessment Deteriorated greatly deterioriated. Time spent with patient 35 minutes, more than 50% of this time was spent counseling patient on the extreme importance of lowering her TG and her abdominal pain.  WE  have samples of Trilipix just brought to our office which I have given to her.  We will add that as well as Fish oil (see pt instructions) to Niaspan and Crestor (I counselled on increased risk of myalgias with this combintation). Recheck FLP, hepatic panel tomorrow morning(fasting).  Her updated medication list for this problem includes:    Niaspan 500 Mg Tbcr (Niacin (antihyperlipidemic)) .Marland Kitchen... 1 tab by mouth at bedtime x 4 weeks, then 2 tabs by mouth at bedtime    Fenofibrate 160 Mg Tabs (Fenofibrate) .Marland Kitchen... 1 tab by mouth daily.    Crestor 10 Mg Tabs (Rosuvastatin calcium) .Marland Kitchen... 1 tablet by mouth daily    Trilipix 135 Mg Cpdr (Choline fenofibrate) .Marland Kitchen... 1 tab by mouth daily.  Problem # 2:  ABDOMINAL PAIN-LUQ (ICD-789.02) Assessment: Unchanged  This is a difficult issue.  I do not think she is drug seeking but she is taking too much vicodin which is metabolized in the liver and she does have fatty liver.  However, all narcotics (especially with Tylenol) have the same issue.  I would rather she take this the too much Tylenol as she was doing in the past.  REcheck liver function. I have told her this is last rx I am giving her for vicodin.  Pain should not be so severe that she is taking more than 1 or 2 per week. 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   By bringing down her TG, I hope to improve her pain.  CHeck lipase as well to rule out pancreatitis given her high TG.  Orders: Venipuncture (78295) TLB-Lipid Panel (80061-LIPID) TLB-Hepatic/Liver Function Pnl (80076-HEPATIC) TLB-Lipase (83690-LIPASE) TLB-Amylase (82150-AMYL)  Complete Medication List: 1)  Glycopyrrolate 1 Mg Tabs (Glycopyrrolate) .... By mouth two times a day as needed abd pain/bloating 2)  Omeprazole 40 Mg Cpdr (Omeprazole) .Marland Kitchen.. 1 each day 30 minutes before meal 3)  Vicodin 5-500 Mg Tabs (Hydrocodone-acetaminophen) .Marland Kitchen.. 1  tab by mouth every 6 hours as needed for pain. 4)  Niaspan 500 Mg Tbcr (Niacin (antihyperlipidemic)) .Marland Kitchen.. 1 tab by mouth at bedtime x 4 weeks, then 2 tabs by mouth at bedtime 5)  Fenofibrate 160 Mg Tabs (Fenofibrate) .Marland Kitchen.. 1 tab by mouth daily. 6)  Crestor 10 Mg Tabs (Rosuvastatin calcium) .Marland Kitchen.. 1 tablet by mouth daily 7)  Trilipix 135 Mg Cpdr (Choline fenofibrate) .Marland Kitchen.. 1 tab by mouth daily.  Patient Instructions: 1)  I  immediately want to add Omega 3 fatty acid fish oil (with EPA)- 4 grams daily. 2)  Please start takin gthe Trilipix as well with your Niacin and Crestor. 3)  I will call you with your lab results today. Prescriptions: VICODIN 5-500 MG TABS (HYDROCODONE-ACETAMINOPHEN) 1 tab by mouth every 6 hours as needed for pain.  #60 x 0   Entered and Authorized by:   Ruthe Mannan MD   Signed by:   Ruthe Mannan MD on 04/02/2010   Method used:   Print then Give to Patient   RxID:  [id number]    Orders Added: 1)  Venipuncture [36415] 2)  TLB-Lipid Panel [80061-LIPID] 3)  TLB-Hepatic/Liver Function Pnl [80076-HEPATIC] 4)  TLB-Lipase [83690-LIPASE] 5)  TLB-Amylase [82150-AMYL] 6)  Est. Patient Level V [30160]    Current Allergies (reviewed today): ! PENICILLIN ! MORPHINE

## 2010-04-11 ENCOUNTER — Telehealth: Payer: Self-pay | Admitting: Family Medicine

## 2010-04-15 NOTE — Progress Notes (Signed)
Summary: vicodin  Phone Note Refill Request Message from:  Fax from Pharmacy on April 11, 2010 9:55 AM  Refills Requested: Medication #1:  VICODIN 5-500 MG TABS 1 tab by mouth every 6 hours as needed for pain.   Last Refilled: 03/20/2010 Refill request from [facility] [address] [phone]  Initial call taken by: Melody Comas,  April 11, 2010 9:56 AM  Follow-up for Phone Call        denied. Ruthe Mannan MD  April 11, 2010 9:58 AM  Patient notified.  Follow-up by: Melody Comas,  April 11, 2010 10:01 AM

## 2010-04-15 NOTE — Progress Notes (Signed)
Summary: vicodin  Phone Note Refill Request Call back at [phone] Message from:  Fax from Pharmacy on April 07, 2010 10:50 AM  Refills Requested: Medication #1:  VICODIN 5-500 MG TABS 1 tab by mouth every 6 hours as needed for pain.   Last Refilled: 03/20/2010 [facility]ill [facility] from [facility] [address]   Initial call taken by: Melody Comas,  April 07, 2010 10:50 AM  Follow-up for Phone Call        declined.  at last office visit, was discussed with pt that we would not refill her vicodin. Ruthe Mannan MD  April 07, 2010 11:15 AM   Spoke with patient and she says that she never requested it from the pharmacy, and that she doesn't need the refill.  Follow-up by: Melody Comas,  April 07, 2010 11:21 AM

## 2010-04-15 NOTE — Assessment & Plan Note (Signed)
Summary: F/U LABWORK/CLE   [employer]   Vital Signs:  Patient profile:   40 year old female Height:      64 inches Weight:      171 pounds BMI:     29.46 Temp:     98.4 degrees F oral Pulse rate:   103 / minute Pulse rhythm:   regular BP sitting:   120 / 90  (left arm) Cuff size:   regular  Vitals Entered By: Linde Gillis CMA Duncan Dull) (April 09, 2010 12:10 PM) CC: follow up after labs   History of Present Illness: 40 yo here to follow up TG and abdominal pain.    Abdominal pain has improved, only took 2 vicodin since she last saw me.    TG increased to 1424 in February from 382 in 11/10.  We started Tricor but she could not afford it and Niacin.  Gave her samples of Trilipix, added fish oil, continued Niacin 1 gram at bedtime and added Crestor 10 mg daily.  On 3/1, TG improved to 738.  Cutting sugar and fat out of her diet as well.    Current Medications (verified): 1)  Glycopyrrolate 1 Mg  Tabs (Glycopyrrolate) .... By Mouth Two Times A Day As Needed Abd Pain/bloating 2)  Omeprazole 40 Mg  Cpdr (Omeprazole) .Marland Kitchen.. 1 Each Day 30 Minutes Before Meal 3)  Vicodin 5-500 Mg Tabs (Hydrocodone-Acetaminophen) .Marland Kitchen.. 1 Tab By Mouth Every 6 Hours As Needed For Pain. 4)  Niaspan 500 Mg Tbcr (Niacin (Antihyperlipidemic)) .Marland Kitchen.. 1 Tab By Mouth At Bedtime X 4 Weeks, Then 2 Tabs By Mouth At Bedtime 5)  Fenofibrate 160 Mg Tabs (Fenofibrate) .Marland Kitchen.. 1 Tab By Mouth Daily. 6)  Crestor 10 Mg Tabs (Rosuvastatin Calcium) .Marland Kitchen.. 1 Tablet By Mouth Daily 7)  Trilipix 135 Mg Cpdr (Choline Fenofibrate) .Marland Kitchen.. 1 Tab By Mouth Daily.  Allergies: 1)  ! Penicillin 2)  ! Morphine  Past History:  Past Medical History: Last updated: 04/17/2009 Headache G3P3 Anal Fissure Hypertension Irritable Bowel Syndrome Right adenexal cyst  Past Surgical History: Last updated: 04/17/2009 Caesarean section Hysterectomy 2004 Cholecystectomy 2001 Bilateral breast reduction 1997  Family History: Last updated: 04/17/2009 Mom  -alive,  had throat CA in 40s, HLD, HTN as well Dad- alive,  CAD s/p 2 stents, MI at 26, HLD, HTN  Social History: Last updated: 04/17/2009 [place] with husband, 3 daughters (35, 22, and 7) and 3 step sons.   Current Smoker Alcohol use-yes Regular exercise-no Self employed as a Designer, industrial/product. Daily Caffeine Use 1-2   Risk Factors: Exercise: no (11/06/2008)  Risk Factors: Smoking Status: quit (12/13/2008)  Review of Systems      See HPI GI:  Denies bloody stools, change in bowel habits, constipation, nausea, and vomiting.  Physical Exam  General:  Well-developed,well-nourished,in no acute distress; alert,appropriate and cooperative throughout examination. Psych:  talkative, normally interactive, good eye contact, not anxious appearing, and not depressed appearing.     Impression & Recommendations:  Problem # 1:  HYPERCHOLESTEROLEMIA (ICD-272.0) Assessment Improved Time spent with patient 15 minutes, more than 50% of this time was spent counseling patient on elevated TG.  Much improved but still remains very elevated.  Continue current medications and we will recheck lipids and hepatic panel in 2 weeks.  Her updated medication list for this problem includes:    Niaspan 500 Mg Tbcr (Niacin (antihyperlipidemic)) .Marland Kitchen... 1 tab by mouth at bedtime x 4 weeks, then 2 tabs by mouth at bedtime    Fenofibrate  160 Mg Tabs (Fenofibrate) .Marland Kitchen... 1 tab by mouth daily.    Crestor 10 Mg Tabs (Rosuvastatin calcium) .Marland Kitchen... 1 tablet by mouth daily    Trilipix 135 Mg Cpdr (Choline fenofibrate) .Marland Kitchen... 1 tab by mouth daily.  Complete Medication List: 1)  Glycopyrrolate 1 Mg Tabs (Glycopyrrolate) .... By mouth two times a day as needed abd pain/bloating 2)  Omeprazole 40 Mg Cpdr (Omeprazole) .Marland Kitchen.. 1 each day 30 minutes before meal 3)  Vicodin 5-500 Mg Tabs (Hydrocodone-acetaminophen) .Marland Kitchen.. 1 tab by mouth every 6 hours as needed for pain. 4)  Niaspan 500 Mg Tbcr (Niacin (antihyperlipidemic)) .Marland Kitchen.. 1  tab by mouth at bedtime x 4 weeks, then 2 tabs by mouth at bedtime 5)  Fenofibrate 160 Mg Tabs (Fenofibrate) .Marland Kitchen.. 1 tab by mouth daily. 6)  Crestor 10 Mg Tabs (Rosuvastatin calcium) .Marland Kitchen.. 1 tablet by mouth daily 7)  Trilipix 135 Mg Cpdr (Choline fenofibrate) .Marland Kitchen.. 1 tab by mouth daily.  Patient Instructions: 1)  please schedule fasting lab appointment in 2 weeks - fasting lipid panel, hepatic panel (272.4).   Orders Added: 1)  Est. Patient Level III [16109]    Current Allergies (reviewed today): ! PENICILLIN ! MORPHINE

## 2010-04-17 ENCOUNTER — Encounter: Payer: Self-pay | Admitting: Family Medicine

## 2010-04-17 ENCOUNTER — Ambulatory Visit (INDEPENDENT_AMBULATORY_CARE_PROVIDER_SITE_OTHER): Payer: 59 | Admitting: Family Medicine

## 2010-04-17 DIAGNOSIS — R002 Palpitations: Secondary | ICD-10-CM

## 2010-04-17 DIAGNOSIS — E785 Hyperlipidemia, unspecified: Secondary | ICD-10-CM

## 2010-04-17 HISTORY — DX: Palpitations: R00.2

## 2010-04-18 ENCOUNTER — Other Ambulatory Visit: Payer: 59

## 2010-04-22 ENCOUNTER — Other Ambulatory Visit (INDEPENDENT_AMBULATORY_CARE_PROVIDER_SITE_OTHER): Payer: 59 | Admitting: Family Medicine

## 2010-04-22 DIAGNOSIS — E785 Hyperlipidemia, unspecified: Secondary | ICD-10-CM

## 2010-04-22 LAB — HEPATIC FUNCTION PANEL
ALT: 36 U/L — ABNORMAL HIGH (ref 0–35)
AST: 28 U/L (ref 0–37)
Albumin: 4.4 g/dL (ref 3.5–5.2)
Alkaline Phosphatase: 42 U/L (ref 39–117)
Bilirubin, Direct: 0.1 mg/dL (ref 0.0–0.3)
Total Protein: 7 g/dL (ref 6.0–8.3)

## 2010-04-22 LAB — LIPID PANEL: Cholesterol: 225 mg/dL — ABNORMAL HIGH (ref 0–200)

## 2010-04-22 NOTE — Assessment & Plan Note (Signed)
Summary: DISCUSS MEDS   Vital Signs:  Patient profile:   40 year old female Height:      64 inches Weight:      168 pounds BMI:     28.94 Temp:     98.7 degrees F oral Pulse rate:   76 / minute Pulse rhythm:   regular BP sitting:   122 / 84  (left arm) Cuff size:   regular  Vitals Entered By: Linde Gillis CMA Duncan Dull) (April 17, 2010 2:54 PM) CC: ? anxiety, panic attacks   History of Present Illness: 40 yo here for ? panic attacks.  For past several months, she has been on edge about abdominal pain although she never had a panic attack.  Abdominal pain has improved,.  TG increased to 1424 in February from 382 in 11/10.  We started Tricor but she could not afford it and Niacin.  Gave her samples of Trilipix, added fish oil, continued Niacin 1 gram at bedtime and added Crestor 10 mg daily.  On 3/1, TG improved to 738.  Cutting sugar and fat out of her diet as well.    Burgess Estelle was a work and having a stressful day and had acute onset of flushing and chest tightness. Had to sit down and catch her breath.  Never had anything like this before.  She could feel herself getting anxious but not sure if it was a panic attacks.  Her mother gets panic attacks so she assumes that is what it was. No diaphoresis or radiation of pain but she has been a little nauseated since we added Trilipix to her medicatons.    Pt does admit that perhaps she has had increased anxiety since adding Trilipix. Also noticed some muscle aches in arms and legs.  Current Medications (verified): 1)  Glycopyrrolate 1 Mg  Tabs (Glycopyrrolate) .... By Mouth Two Times A Day As Needed Abd Pain/bloating 2)  Omeprazole 40 Mg  Cpdr (Omeprazole) .Marland Kitchen.. 1 Each Day 30 Minutes Before Meal 3)  Vicodin 5-500 Mg Tabs (Hydrocodone-Acetaminophen) .Marland Kitchen.. 1 Tab By Mouth Every 6 Hours As Needed For Pain. 4)  Niaspan 500 Mg Tbcr (Niacin (Antihyperlipidemic)) .Marland Kitchen.. 1 Tab By Mouth At Bedtime X 4 Weeks, Then 2 Tabs By Mouth At Bedtime 5)   Fenofibrate 160 Mg Tabs (Fenofibrate) .Marland Kitchen.. 1 Tab By Mouth Daily. 6)  Crestor 10 Mg Tabs (Rosuvastatin Calcium) .Marland Kitchen.. 1 Tablet By Mouth Daily 7)  Trilipix 135 Mg Cpdr (Choline Fenofibrate) .Marland Kitchen.. 1 Tab By Mouth Daily. 8)  Alprazolam 0.25 Mg Tabs (Alprazolam) .Marland Kitchen.. 1 Tab By Mouth Three Times A Day As Needed Anxiety  Allergies: 1)  ! Penicillin 2)  ! Morphine  Past History:  Past Medical History: Last updated: 04/17/2009 Headache G3P3 Anal Fissure Hypertension Irritable Bowel Syndrome Right adenexal cyst  Past Surgical History: Last updated: 04/17/2009 Caesarean section Hysterectomy 2004 Cholecystectomy 2001 Bilateral breast reduction 1997  Family History: Last updated: 04/17/2009 Mom -alive,  had throat CA in 40s, HLD, HTN as well Dad- alive,  CAD s/p 2 stents, MI at 62, HLD, HTN  Social History: Last updated: 04/17/2009 [place] with husband, 3 daughters (57, 30, and 7) and 3 step sons.   Current Smoker Alcohol use-yes Regular exercise-no Self employed as a Designer, industrial/product. Daily Caffeine Use 1-2   Risk Factors: Exercise: no (11/06/2008)  Risk Factors: Smoking Status: quit (12/13/2008)  Review of Systems      See HPI General:  Denies fever. GI:  Complains of nausea; denies vomiting.  Physical Exam  General:  Well-developed,well-nourished,in no acute distress; alert,appropriate and cooperative throughout examination. Mouth:  Oral mucosa and oropharynx without lesions or exudates.  Teeth in good repair. Lungs:  Normal respiratory effort, chest expands symmetrically. Lungs are clear to auscultation, no crackles or wheezes. Heart:  Normal rate and regular rhythm. S1 and S2 normal without gallop, murmur, click, rub or other extra sounds. Abdomen:  Bowel sounds positive,abdomen soft. reamins tender over TTP over LUQ, otherwise non tender. I cannot feel any masses, no hepatomegaly.   Psych:  talkative, normally interactive, good eye contact, not anxious appearing,  and not depressed appearing.     Impression & Recommendations:  Problem # 1:  PALPITATIONS, OCCASIONAL (ICD-785.1) Assessment New EKG within normal limits.  I think this is likely multifactorial- combination of vidcodin withdrawal (I will no longer refill) along with anxiety. Will give xanax for as needed panic attacks.  I also want to stop Crestor given her myalgias. Advised to go to ER immediately if symptoms worsen. Orders: EKG w/ Interpretation (93000)  Problem # 2:  HYPERCHOLESTEROLEMIA (ICD-272.0) Assessment: Unchanged Given her elevated TG, I would lke to recheck a liipid panel, hepatic panel and lipase to make sure we are still not developping pancreatis.  Pt will return tomorrow morning for fasting labs. Her updated medication list for this problem includes:    Niaspan 500 Mg Tbcr (Niacin (antihyperlipidemic)) .Marland Kitchen... 1 tab by mouth at bedtime x 4 weeks, then 2 tabs by mouth at bedtime    Fenofibrate 160 Mg Tabs (Fenofibrate) .Marland Kitchen... 1 tab by mouth daily.    Crestor 10 Mg Tabs (Rosuvastatin calcium) .Marland Kitchen... 1 tablet by mouth daily    Trilipix 135 Mg Cpdr (Choline fenofibrate) .Marland Kitchen... 1 tab by mouth daily.  Complete Medication List: 1)  Glycopyrrolate 1 Mg Tabs (Glycopyrrolate) .... By mouth two times a day as needed abd pain/bloating 2)  Omeprazole 40 Mg Cpdr (Omeprazole) .Marland Kitchen.. 1 each day 30 minutes before meal 3)  Vicodin 5-500 Mg Tabs (Hydrocodone-acetaminophen) .Marland Kitchen.. 1 tab by mouth every 6 hours as needed for pain. 4)  Niaspan 500 Mg Tbcr (Niacin (antihyperlipidemic)) .Marland Kitchen.. 1 tab by mouth at bedtime x 4 weeks, then 2 tabs by mouth at bedtime 5)  Fenofibrate 160 Mg Tabs (Fenofibrate) .Marland Kitchen.. 1 tab by mouth daily. 6)  Crestor 10 Mg Tabs (Rosuvastatin calcium) .Marland Kitchen.. 1 tablet by mouth daily 7)  Trilipix 135 Mg Cpdr (Choline fenofibrate) .Marland Kitchen.. 1 tab by mouth daily. 8)  Alprazolam 0.25 Mg Tabs (Alprazolam) .Marland Kitchen.. 1 tab by mouth three times a day as needed anxiety  Patient Instructions: 1)   please stop taking crestor. 2)  come in for fasting labs tomorrow morning- fasting lipid panel, hepatic panel, lipase (272.0, 571.1). Prescriptions: ALPRAZOLAM 0.25 MG TABS (ALPRAZOLAM) 1 tab by mouth three times a day as needed anxiety  #90 x 0   Entered and Authorized by:   Ruthe Mannan MD   Signed by:   Ruthe Mannan MD on 04/17/2010   Method used:   Print then Give to Patient   RxID:   [id number]    Orders Added: 1)  EKG w/ Interpretation [93000] 2)  Est. Patient Level IV [43329]    Current Allergies (reviewed today): ! PENICILLIN ! MORPHINE

## 2010-04-28 ENCOUNTER — Other Ambulatory Visit: Payer: 59

## 2010-05-14 ENCOUNTER — Telehealth: Payer: Self-pay | Admitting: *Deleted

## 2010-05-14 NOTE — Telephone Encounter (Signed)
Patient called and stated that she hasn't had lab test done in a few weeks and wanted to come in for labs and to see Dr. Dayton Martes.  She stated that she is still in pain and having the same symptoms.  She also mentioned that Dr. Dayton Martes mentioned a diet plan at her last office visit and she is interested in starting that plan.  Please advise.

## 2010-05-14 NOTE — Telephone Encounter (Signed)
Can add her tomorrow at 3:15

## 2010-05-14 NOTE — Telephone Encounter (Signed)
Scheduled appt for patient tomorrow at 3:15 with Dr. Dayton Martes, left message with daughter regarding appt.  Will call patient back to verify she received the message.

## 2010-05-15 ENCOUNTER — Encounter: Payer: Self-pay | Admitting: Family Medicine

## 2010-05-15 ENCOUNTER — Ambulatory Visit (INDEPENDENT_AMBULATORY_CARE_PROVIDER_SITE_OTHER): Payer: 59 | Admitting: Family Medicine

## 2010-05-15 ENCOUNTER — Other Ambulatory Visit: Payer: Self-pay | Admitting: Family Medicine

## 2010-05-15 DIAGNOSIS — E669 Obesity, unspecified: Secondary | ICD-10-CM

## 2010-05-15 DIAGNOSIS — E785 Hyperlipidemia, unspecified: Secondary | ICD-10-CM

## 2010-05-15 DIAGNOSIS — R109 Unspecified abdominal pain: Secondary | ICD-10-CM

## 2010-05-15 DIAGNOSIS — Z79899 Other long term (current) drug therapy: Secondary | ICD-10-CM

## 2010-05-15 DIAGNOSIS — F419 Anxiety disorder, unspecified: Secondary | ICD-10-CM | POA: Insufficient documentation

## 2010-05-15 LAB — HM PAP SMEAR

## 2010-05-15 LAB — HM COLONOSCOPY

## 2010-05-15 MED ORDER — PHENTERMINE HCL 15 MG PO CAPS: 15.0000 mg | ORAL_CAPSULE | ORAL | Status: DC

## 2010-05-15 NOTE — Patient Instructions (Signed)
Please come in for labs tomorrow. Make an appointment to come see me in one month.    What is this medicine? PHENTERMINE (FEN ter meen) decreases your appetite. It is used with a reduced calorie diet and exercise to help you lose weight. This medicine may be used for other purposes; ask your health care provider or pharmacist if you have questions.   What should I tell my health care provider before I take this medicine? They need to know if you have any of these conditions: -agitation -glaucoma -heart disease -high blood pressure -history of substance abuse -lung disease called Primary Pulmonary Hypertension (PPH) -thyroid disease -an unusual or allergic reaction to phentermine, other medicines, foods, dyes, or preservatives -pregnant or trying to get pregnant -breast-feeding   How should I use this medicine? Take this medicine by mouth with a glass of water. Follow the directions on the prescription label. This medicine is usually taken 30 minutes before or 1 to 2 hours after breakfast. Avoid taking this medicine in the evening. It may interfere with sleep. Take your doses at regular intervals. Do not take your medicine more often than directed.   Talk to your pediatrician regarding the use of this medicine in children. Special care may be needed.   Overdosage: If you think you have taken too much of this medicine contact a poison control center or emergency room at once. NOTE: This medicine is only for you. Do not share this medicine with others.   What if I miss a dose? If you miss a dose, take it as soon as you can. If it is almost time for your next dose, take only that dose. Do not take double or extra doses.   What may interact with this medicine? Do not take this medicine with any of the following medications: -duloxetine -MAOIs like Carbex, Eldepryl, Marplan, Nardil, and Parnate -medicines for colds or breathing difficulties like pseudoephedrine or  phenylephrine -procarbazine -sibutramine -SSRIs like citalopram, escitalopram, fluoxetine, fluvoxamine, paroxetine, and sertraline -stimulants like dexmethylphenidate, methylphenidate or modafinil -venlafaxine   This medicine may also interact with the following medications: -medicines for diabetes   This list may not describe all possible interactions. Give your health care provider a list of all the medicines, herbs, non-prescription drugs, or dietary supplements you use. Also tell them if you smoke, drink alcohol, or use illegal drugs. Some items may interact with your medicine.   What should I watch for while using this medicine? Notify your physician immediately if you become short of breath while doing your normal activities.   This medicine was intended to be used in addition to a healthy diet and exercise. The best results are achieved this way. This medicine is only indicated for short-term use. Eventually your weight loss may level out. At that point, the drug will only help you maintain your new weight. Do not increase or in any way change your dose without consulting your doctor.   You may get drowsy or dizzy. Do not drive, use machinery, or do anything that needs mental alertness until you know how this medicine affects you. Do not stand or sit up quickly, especially if you are an older patient. This reduces the risk of dizzy or fainting spells. Alcohol may increase dizziness and drowsiness. Avoid alcoholic drinks.   What side effects may I notice from receiving this medicine? Side effects that you should report to your doctor or health care professional as soon as possible: -chest pain, palpitations -depression or severe  changes in mood -increased blood pressure -irritability -nervousness or restlessness -severe dizziness -shortness of breath -problems urinating -unusual swelling of the legs -vomiting   Side effects that usually do not require medical attention (report  to your doctor or health care professional if they continue or are bothersome): -blurred vision or other eye problems -changes in sexual ability or desire -constipation or diarrhea -difficulty sleeping -dry mouth or unpleasant taste -headache -nausea   This list may not describe all possible side effects. Call your doctor for medical advice about side effects. You may report side effects to FDA at 1-800-FDA-1088.   Where should I keep my medicine? Keep out of the reach of children. This medicine can be abused. Keep your medicine in a safe place to protect it from theft. Do not share this medicine with anyone. Selling or giving away this medicine is dangerous and against the law.   Store at room temperature between 20 and 25 degrees C (68 and 77 degrees F). Keep container tightly closed. Throw away any unused medicine after the expiration date.   NOTE:This sheet is a summary. It may not cover all possible information. If you have questions about this medicine, talk to your doctor, pharmacist, or health care provider.      2011, [employer]vier/[employer] Standard.

## 2010-05-15 NOTE — Assessment & Plan Note (Signed)
Unchanged. >25 min spent with patient, at least half of which was spent on counseling on weight loss. Discussed risks associated with phenteramine, including HTN, stroke, pulmonary HTN(see pt instructions for details). Pt understands and accepts risks. Start with 15 mg daily.  Follow up in one month.

## 2010-05-15 NOTE — Progress Notes (Signed)
40 yo here for to discuss weight loss.  For past several months, she has been on edge about abdominal pain although she never had a panic attack. Had severe hypertriglyceridemia and fatty liver. Trying to loose weight. Wt Readings from Last 3 Encounters:  05/15/10 170 lb 12.8 oz (77.474 kg)  04/17/10 168 lb (76.204 kg)  04/09/10 171 lb (77.565 kg)    Walking more. Cutting sugar and fat out of her diet as well.   TG increased to 1424 in February from 382 in 11/10.  We started Tricor but she could not afford it and Niacin.  Gave her samples of Trilipix, added fish oil, continued Niacin 1 gram at bedtime and added Crestor 10 mg daily.  On 3/1, TG improved to 738 and further improved to 309 in March 2012.  We stopped Crestor last month due to myalgias.    Given xanax for panic attacks last month, which have improved.  I will no longer refill vicodin for her abdominal pain as she has had an extensive work up.   Abdominal pain is improving but she feels it would be even better if she could loose weight.    Referred to GI in January, felt symptoms consistent with IBS.  Given Bentyl to use as needed.   EGD showed erosive esphagitis.  Placed on Omprazole 40 mg daily and Glycopyrrolate 1 mg two times a day for abdomina pain/bloating.  Colonscopy showed polyps.  CT of adomen showed right ovarian cystic structure,follow up ultrasound was negative. Also showed left subpleural nodularity, felt to be lymph node.  MRI showed fatty liver, otherwise unremarkable.  The PMH, PSH, Social History, Family History, Medications, and allergies have been reviewed in [facility], and have been updated if relevant.   Review of Systems       See HPI General:  Denies fever. GI:  Complains of nausea; denies vomiting.  Physical Exam BP 118/84  Pulse 76  Temp(Src) 97.6 F (36.4 C) (Oral)  Ht 5\' 4"  (1.626 m)  Wt 170 lb 12.8 oz (77.474 kg)  BMI 29.32 kg/m2  General:  Well-developed,well-nourished,in no acute  distress; alert,appropriate and cooperative throughout examination. Mouth:  Oral mucosa and oropharynx without lesions or exudates.  Teeth in good repair. Lungs:  Normal respiratory effort, chest expands symmetrically. Lungs are clear to auscultation, no crackles or wheezes. Heart:  Normal rate and regular rhythm. S1 and S2 normal without gallop, murmur, click, rub or other extra sounds. Abdomen:  Bowel sounds positive,abdomen soft. reamins tender over TTP over LUQ, otherwise non tender. I cannot feel any masses, no hepatomegaly.   Psych:  talkative, normally interactive, good eye contact, not anxious appearing, and not depressed appearing.

## 2010-05-15 NOTE — Telephone Encounter (Signed)
Patient confirmed her appt today with Dr. Dayton Martes at 3:15.

## 2010-05-16 ENCOUNTER — Other Ambulatory Visit (INDEPENDENT_AMBULATORY_CARE_PROVIDER_SITE_OTHER): Payer: 59 | Admitting: Family Medicine

## 2010-05-16 DIAGNOSIS — Z79899 Other long term (current) drug therapy: Secondary | ICD-10-CM

## 2010-05-16 DIAGNOSIS — F1910 Other psychoactive substance abuse, uncomplicated: Secondary | ICD-10-CM

## 2010-05-16 DIAGNOSIS — E785 Hyperlipidemia, unspecified: Secondary | ICD-10-CM

## 2010-05-16 LAB — LIPID PANEL
Cholesterol: 242 mg/dL — ABNORMAL HIGH (ref 0–200)
HDL: 33 mg/dL — ABNORMAL LOW (ref >39.00)
VLDL: 86 mg/dL — ABNORMAL HIGH (ref 0.0–40.0)

## 2010-05-16 LAB — HEPATIC FUNCTION PANEL
Alkaline Phosphatase: 46 U/L (ref 39–117)
Bilirubin, Direct: 0.1 mg/dL (ref 0.0–0.3)

## 2010-05-19 ENCOUNTER — Other Ambulatory Visit: Payer: Self-pay | Admitting: Family Medicine

## 2010-05-19 ENCOUNTER — Other Ambulatory Visit: Payer: Self-pay | Admitting: *Deleted

## 2010-05-19 MED ORDER — SIMVASTATIN 10 MG PO TABS: 10.0000 mg | ORAL_TABLET | Freq: Every day | ORAL | Status: DC

## 2010-05-19 MED ORDER — ALPRAZOLAM 0.25 MG PO TABS: 0.2500 mg | ORAL_TABLET | Freq: Three times a day (TID) | ORAL | Status: DC | PRN

## 2010-05-19 NOTE — Telephone Encounter (Signed)
Rx called to [facility] [facility].

## 2010-05-21 ENCOUNTER — Ambulatory Visit: Payer: 59 | Admitting: Family Medicine

## 2010-06-11 ENCOUNTER — Encounter: Payer: Self-pay | Admitting: Family Medicine

## 2010-06-12 ENCOUNTER — Ambulatory Visit (INDEPENDENT_AMBULATORY_CARE_PROVIDER_SITE_OTHER): Payer: 59 | Admitting: Family Medicine

## 2010-06-12 ENCOUNTER — Encounter: Payer: Self-pay | Admitting: Family Medicine

## 2010-06-12 DIAGNOSIS — Z79899 Other long term (current) drug therapy: Secondary | ICD-10-CM

## 2010-06-12 DIAGNOSIS — E785 Hyperlipidemia, unspecified: Secondary | ICD-10-CM

## 2010-06-12 DIAGNOSIS — E669 Obesity, unspecified: Secondary | ICD-10-CM

## 2010-06-12 MED ORDER — PHENTERMINE HCL 15 MG PO CAPS: 15.0000 mg | ORAL_CAPSULE | Freq: Two times a day (BID) | ORAL | Status: AC

## 2010-06-12 NOTE — Progress Notes (Signed)
40 yo here for to follow up weight loss.  Really trying to loose weight Had severe hypertriglyceridemia and fatty liver. Started phenteramine 15 mg daily last month.  Felt it has worked to suppress her appetite somewhat in the morning but by mid day she is extremely hungry and making poor food choices.   Wt Readings from Last 3 Encounters:  06/12/10 167 lb 6.4 oz (75.932 kg)  05/15/10 170 lb 12.8 oz (77.474 kg)  04/17/10 168 lb (76.204 kg)   Just started hypnotherapy to help with smoking, has already cut back to a few cigarettes per day!  Walking more. Cutting sugar and fat out of her diet as well.   TG increased to 1424 in February from 382 in 11/10.  We started Tricor but she could not afford it and Niacin.  Gave her samples of Trilipix, added fish oil, continued Niacin 1 gram at bedtime and added Crestor 10 mg daily.  On 3/1, TG improved to 738 and further improved to 309 in March 2012.  We stopped Crestor last month due to myalgias.    Referred to GI in January, felt symptoms consistent with IBS.  Given Bentyl to use as needed.   EGD showed erosive esphagitis.  Placed on Omprazole 40 mg daily and Glycopyrrolate 1 mg two times a day for abdomina pain/bloating.  Colonscopy showed polyps.  CT of adomen showed right ovarian cystic structure,follow up ultrasound was negative. Also showed left subpleural nodularity, felt to be lymph node.  MRI showed fatty liver, otherwise unremarkable.  The PMH, PSH, Social History, Family History, Medications, and allergies have been reviewed in [facility], and have been updated if relevant.   Review of Systems       See HPI General:  Denies fever. GI:  Complains of nausea; denies vomiting.  Physical Exam BP 120/84  Pulse 72  Temp(Src) 98 F (36.7 C) (Oral)  Ht 5\' 4"  (1.626 m)  Wt 167 lb 6.4 oz (75.932 kg)  BMI 28.73 kg/m2  General:  Well-developed,well-nourished,in no acute distress; alert,appropriate and cooperative throughout  examination. Mouth:  Oral mucosa and oropharynx without lesions or exudates.  Teeth in good repair. Lungs:  Normal respiratory effort, chest expands symmetrically. Lungs are clear to auscultation, no crackles or wheezes. Heart:  Normal rate and regular rhythm. S1 and S2 normal without gallop, murmur, click, rub or other extra sounds. Abdomen:  Bowel sounds positive,abdomen soft. reamins tender over TTP over LUQ, otherwise non tender. I cannot feel any masses, no hepatomegaly.   Psych:  talkative, normally interactive, good eye contact, not anxious appearing, and not depressed appearing.

## 2010-06-12 NOTE — Patient Instructions (Signed)
Please make appt for one month follow up (fasting)

## 2010-06-12 NOTE — Assessment & Plan Note (Signed)
Unchanged. Pt not fasting today, will return next month for fasting labs.

## 2010-06-12 NOTE — Assessment & Plan Note (Signed)
Improved. BP and pulses tolerating phenteramine well.  No side effects. Will increase to 15 mg twice daily. Follow up in one month.

## 2010-06-13 ENCOUNTER — Telehealth: Payer: Self-pay | Admitting: *Deleted

## 2010-06-13 MED ORDER — ALPRAZOLAM 0.25 MG PO TABS: 0.2500 mg | ORAL_TABLET | Freq: Three times a day (TID) | ORAL | Status: DC | PRN

## 2010-06-13 NOTE — Telephone Encounter (Signed)
Patient called and wants a refill on Xanax.  Her grandmother passed away this morning. [facility]ses [facility] [facility]

## 2010-06-13 NOTE — Telephone Encounter (Signed)
[facility], patient notified as instructed via telephone. 

## 2010-06-13 NOTE — Telephone Encounter (Signed)
Yes ok to refill as entered below.

## 2010-06-20 NOTE — Op Note (Signed)
Kelly Rollins, Kelly Rollins                      ACCOUNT NO.:  [id number]   MEDICAL RECORD NO.:  [id number]                   PATIENT TYPE:  INP   LOCATION:  9399                                 FACILITY:  [facility]   PHYSICIAN:  Malva Limes, M.D.                 DATE OF BIRTH:  06/04/1970   DATE OF PROCEDURE:  03/22/2002  DATE OF DISCHARGE:                                 OPERATIVE REPORT   PREOPERATIVE DIAGNOSES:  1. Chronic pelvic pain.  2. Dyspareunia.  3. History of endometriosis.  4. Symptomatic rectocele.   POSTOPERATIVE DIAGNOSES:  1. Chronic pelvic pain.  2. Dyspareunia.  3. History of endometriosis.  4. Symptomatic rectocele.   PROCEDURES:  1. Diagnostic laparoscopy.  2. Total abdominal hysterectomy.  3. Posterior colporrhaphy.   SURGEON:  Malva Limes, M.D.   ASSISTANT:  Randye Lobo, M.D.   ANESTHESIA:  General endotracheal.   ANTIBIOTICS:  Ancef 1 g.   DRAINS:  Foley to bedside drainage.   ESTIMATED BLOOD LOSS:  200 mL.   COMPLICATIONS:  None.   SPECIMENS:  Uterus, cervix sent to pathology.   FINDINGS:  On placement of the laparoscopy the patient was noted to have  extensive adhesions between the uterine fundus and the anterior abdominal  wall involving the bladder.  There were also adhesions between the sigmoid  colon and the posterior aspect of the uterus.  The ovaries appeared to be  normal bilaterally.  The patient had a few omental adhesions to the anterior  abdominal wall.  The liver appeared to be normal.  The appendix was not  visualized.  There was no evidence of endometriosis.   DESCRIPTION OF PROCEDURE:  The patient was taken to the operating room,  where she was placed in a dorsal supine position and a general anesthetic  was administered without complications.  She was then placed in the dorsal  lithotomy position and prepped with Hibiclens.  Her bladder was drained with  a Foley catheter.  She was draped in the usual fashion for this  procedure.  Her umbilicus was injected with 1% lidocaine.  A vertical skin incision was  made, the Veress needle was placed in the peritoneal cavity, and 3 L of  carbon dioxide was then placed.  The 10 mm trocar was then placed into the  abdominal cavity, the scope was then placed, and the patient was placed in  Trendelenburg.  Examination of the abdominal and pelvic contents was  undertaken with the findings as noted above.  It was felt that the  extensive, dense adhesions between the uterus and the anterior abdominal  wall would take a significant amount of time, and therefore it was decided  to proceed on with total abdominal hysterectomy.  At this point the patient  was placed in a dorsal supine position.  The patient had a Pfannenstiel  incision made through the previous scar.  This was carried down  to the  fascia.  The fascia was entered in the midline and extended laterally with  the Mayo scissors.  The rectus muscles were divided from the fascia with the  Bovie.  The rectus muscles were divided in the midline and taken superiorly  and inferiorly.  The parietal peritoneum was entered sharply and taken  superiorly and inferiorly.  The omental adhesions were then taken down  sharply.  The bowel was freed from the posterior aspect of the uterus.  The  O'Connor-O'Sullivan retractor was then placed and the bowel packed away.  At  this point the dense adhesions between the uterus and the anterior abdominal  wall and bladder were transected sharply, and the uterus was freed.  Following this the round ligament on the left was ligated with 0 Monocryl  suture and transected with the Bovie.  The anterior and posterior leaf of  the broad ligament were then opened.  The infundibulopelvic ligament and  ureter were identified.  The ovarian ligament and fallopian tube were then  clamped, cut, and ligated x2 with 0 Monocryl suture.  The uterine vessels  were then skeletonized, clamped, cut, and  ligated with 0 Monocryl suture.  A  similar procedure was performed on the opposite side.  The bladder was then  sharply dissected from the lower uterine segment.  The cardinal ligaments  were then serially clamped, cut, and ligated with 0 Monocryl suture.  Once  the level of the cervical os was reached, the vagina was clamped and entered  and the uterus and cervix removed from the vagina with the scissors.  The  vaginal angles were closed with 0 Monocryl suture in Heaney fashion.  The  remaining vaginal cuff was closed using interrupted 0 Monocryl suture in  figure-of-eight fashion.  The pelvis was then copiously irrigated and felt  to be hemostatic.  The ovaries were then suspended to the round ligaments  with 2-0 Vicryl suture.  At this point the procedure was concluded, the laps  and retractors removed, and the parietal peritoneum and rectus muscles were  then closed using 2-0 Monocryl in a running fashion.  The fascia was closed  using 0 Monocryl suture in a running fashion.  The subcuticular tissue was  made hemostatic with the Bovie.  The subcuticular tissue was then closed  using 2-0 plain gut suture in interrupted fashion.  The skin was closed with  stainless steel clips.  At this point the patient was then placed in the  dorsal lithotomy position and the rectocele identified.  The posterior  vagina was injected with 1% lidocaine with epinephrine.  A wedge of skin was  removed from the posterior perineum.  The posterior vagina was then  undermined with the Metzenbaum scissors and the rectocele identified.  The  rectocele was then repaired using 2-0 Vicryl suture in a running fashion.  Once this was completed, the excess vaginal mucosa was excised and closed  using 2-0 Vicryl suture in a running locking fashion.  The posterior  perineum was built up using interrupted 0 Monocryl suture.  The remaining  posterior perineum was closed in a manner similar to the episiotomy repair. This  concluded the procedure.  Two-inch iodoform gauze was placed in the  vagina.  The patient was then extubated and taken to the recovery room in  stable condition.  Instrument and lap counts were correct x2.  Malva Limes, M.D.    MA/MEDQ  D:  03/22/2002  T:  03/22/2002  Job:  [id number]

## 2010-06-20 NOTE — H&P (Signed)
NAME:  Kelly Rollins, BURKLEY                      ACCOUNT NO.:  [id number]   MEDICAL RECORD NO.:  [id number]                   PATIENT TYPE:  INP   LOCATION:  NA                                   FACILITY:  [facility]   PHYSICIAN:  Malva Limes, M.D.                 DATE OF BIRTH:  11/15/1970   DATE OF ADMISSION:  DATE OF DISCHARGE:                                HISTORY & PHYSICAL   HISTORY OF PRESENT ILLNESS:  The patient is a 40 year old white female, G3,  P3, who presents for laparoscopic-assisted vaginal hysterectomy secondary to  worsening dyspareunia and chronic pelvic pain.  The patient states that she  has a three-year history of worsening pelvic pain and dyspareunia.  The  patient underwent a diagnostic laparoscopy in 2002 which revealed adhesions  and endometriosis.  The patient was offered Lupron and declined.  She also  was offered oral contraceptive pills but states she does not want to take  this because of her history of hypertension.  The patient states the pain is  daily, is causing problems with her relationship with her husband.  She  finds no relief with nonsteroidal anti-inflammatory agents.  The patient has  normal bowel function. She states that she is done with childbirth.  Her  husband has a vasectomy.   ALLERGIES:  CODEINE causes nausea.   CURRENT MEDICATIONS:  None.   PAST SURGICAL HISTORY:  1. Cesarean section followed by two vaginal births after cesarean section.  2. Breast reduction in 1997.  3. Cholecystectomy in 2001.  4. Laparoscopy as mentioned above in 2002.   SOCIAL HISTORY:  The patient smokes one-half pack per day.   FAMILY HISTORY:  Cardiovascular disease, diabetes, and hypertension.   PHYSICAL EXAMINATION:  VITAL SIGNS:  The patient weighs 171 pounds.  Vital  signs are stable; she is afebrile.  GENERAL:  She is in no apparent distress.  HEENT:  Within normal limits.  LUNGS: Clear to auscultation.  BREASTS:  Reveal reduction.  There are no  masses or lymphadenopathy.  There  is no tenderness.  ABDOMEN:  Soft, nontender, nondistended.  There is no organomegaly.  EXTREMITIES:  Within normal limits.  PELVIC:  External genitalia within normal limits.  The vagina is tender in  the posterior cul-de-sac.  She has no masses.  She has no cervical motion  tenderness.  The uterus is normal size and shape.  There are no adnexal  masses.   IMPRESSION:  1. Worsening dyspareunia.  2. Chronic pelvic pain.  3. History of endometriosis.    PLAN:  Proceed with laparoscopic-assisted vaginal hysterectomy.  The patient  understands the possibility of a total abdominal hysterectomy.  Malva Limes, M.D.    MA/MEDQ  D:  03/21/2002  T:  03/21/2002  Job:  [id number]

## 2010-06-20 NOTE — Discharge Summary (Signed)
   Kelly Rollins, Kelly Rollins                      ACCOUNT NO.:  [id number]   MEDICAL RECORD NO.:  [id number]                   PATIENT TYPE:  INP   LOCATION:  9327                                 FACILITY:  [facility]   PHYSICIAN:  Malva Limes, M.D.                 DATE OF BIRTH:  04-04-1970   DATE OF ADMISSION:  03/22/2002  DATE OF DISCHARGE:  03/25/2002                                 DISCHARGE SUMMARY   PRINCIPAL DISCHARGE DIAGNOSES:  1. Chronic pelvic pain.  2. History of endometriosis.  3. Dyspareunia.  4. Symptomatic rectocele.   PRINCIPAL PROCEDURES:  1. Diagnostic laparoscopy.  2. Total abdominal hysterectomy.  3. Posterior colporrhaphy.   HISTORY OF PRESENT ILLNESS:  The patient is a 40 year old white female G3 P3  who presented to [facility] on March 22, 2002 for laparoscopy-  assisted vaginal hysterectomy with a possible total abdominal hysterectomy.  A complete description of the events which led up to this admission can be  found in the dictated History and Physical.   HOSPITAL COURSE:  The patient underwent diagnostic laparoscopy on March 22, 2002.  She was found to have extensive adhesions between the uterine  fundus and the anterior abdominal wall involving the bladder.  There were  also adhesions between the sigmoid colon and the posterior aspect of the  uterus.  There were omental adhesions to the anterior abdominal wall.  There  was no evidence of endometriosis at that time.  Because of the extensive  adhesions, the patient underwent a total abdominal hysterectomy and the  laparoscopy was discontinued.  A complete description of this procedure can  be found in the dictated History and Physical.  The patient's postoperative  course was benign.  She did have some difficulty finding a pain medicine  which did not cause side effects; Demerol finally provided adequate pain  relief without side effects.  The patient remained afebrile through the  postoperative course.  The patient was discharged to home on postoperative  day #3.  At that time she was tolerating a regular diet.  She was ambulating  without difficulty.  The incision appeared to be healing well.  The  patient's pathology report was all benign.   DISPOSITION:  The patient was discharged to home with Tylox.  She was  instructed to follow up in the office in four weeks.                                               Malva Limes, M.D.    MA/MEDQ  D:  04/17/2002  T:  04/17/2002  Job:  [id number]

## 2010-06-20 NOTE — Discharge Summary (Signed)
NAMEDAYRA, RAPLEY                      ACCOUNT NO.:  [id number]   MEDICAL RECORD NO.:  [id number]                   PATIENT TYPE:  INP   LOCATION:  9162                                 FACILITY:  [facility]   PHYSICIAN:  Harlene Salts, M.D.                  DATE OF BIRTH:  08/08/1970   DATE OF ADMISSION:  09/05/2001  DATE OF DISCHARGE:  09/07/2001                                 DISCHARGE SUMMARY   ADMISSION DIAGNOSES:  A 40 year old G3, P3-0-0-3 postpartum day 9 with neck  and shoulder pain for one day.   DISCHARGE DIAGNOSES:  A 40 year old G3, P3-0-0-3 postpartum day 11 with neck  and shoulder pain probably musculoskeletal.   HOSPITAL COURSE:  The patient came into the MAU at [facility] of  [facility]: [facility] with complaints of pain in the back of her neck and shoulders  that began the day prior to admission where she was seen at the ER in  [place] and they sent her home after giving Toradol and she came to the  [facility] still with the same complaint of neck and shoulder pain.  The patient is status post vaginal delivery on August 27, 2001 and reported  that she four days later had a severe headache and was in the ICU at  [facility] for a couple of days and husband reported the history of patient  shaking and thought it was a seizure, but has not had any since.  The  patient had no complaints of headache, blurred vision, nausea, vomiting,  fever, or abdominal pain on admission.  She was admitted secondary to high  blood pressure which was 158-181/92-103 when seen in the MAU and also with  this vague history of severe headache and being postpartum, we admitted her  and started her on magnesium sulfate and she was given Toradol for pain.  The patient did find throughout the first day at night developed a frontal  headache after drinking some milkshake.  Was given ibuprofen and later that  same night said her headache was still bad so she was given Percocet which  improved the  headache.  On day 2 of admission patient's range of motion was  better in her neck and the pain was improving and blood pressures were  stable at 132-150/95.  The patient then started on Inderal and Valium for  the blood pressure and muscle spasm respectively.  On day of discharge  patient was discontinued from the magnesium sulfate.  Urine output was good.  Blood pressures were stable at 120s-130s/80s-90s with occasional systolic  140-150.  The patient discharged home with labetalol 100 mg p.o. b.i.d. 28  and Ativan 0.5 mg t.i.d. p.r.n. muscle spasm 20 and Percocet 5/325 one p.o.  q.4-6h. as needed pain 10.   The patient is to follow up at [facility] in one  week and patient to make that appointment and also bring a  diary of her  blood pressures over this week to assess for chronic hypertension.                                               Harlene Salts, M.D.    AS/MEDQ  D:  09/07/2001  T:  09/10/2001  Job:  [id number]

## 2010-07-14 ENCOUNTER — Other Ambulatory Visit: Payer: 59

## 2010-07-14 ENCOUNTER — Ambulatory Visit: Payer: 59 | Admitting: Family Medicine

## 2010-12-17 ENCOUNTER — Ambulatory Visit: Payer: Self-pay | Admitting: Endocrinology

## 2011-01-03 ENCOUNTER — Ambulatory Visit: Payer: Self-pay | Admitting: Endocrinology

## 2012-10-07 ENCOUNTER — Ambulatory Visit: Payer: Self-pay | Admitting: Unknown Physician Specialty

## 2013-01-05 ENCOUNTER — Ambulatory Visit: Payer: Self-pay | Admitting: Orthopedic Surgery

## 2013-03-18 ENCOUNTER — Emergency Department: Payer: Self-pay | Admitting: Emergency Medicine

## 2013-03-18 LAB — URINALYSIS, COMPLETE
BACTERIA: NONE SEEN
BILIRUBIN, UR: NEGATIVE
GLUCOSE, UR: NEGATIVE mg/dL (ref 0–75)
KETONE: NEGATIVE
Leukocyte Esterase: NEGATIVE
NITRITE: NEGATIVE
Ph: 7 (ref 4.5–8.0)
Protein: NEGATIVE
RBC,UR: 3 /HPF (ref 0–5)
Specific Gravity: 1.008 (ref 1.003–1.030)
Squamous Epithelial: <1

## 2013-03-18 LAB — CBC WITH DIFFERENTIAL/PLATELET
Basophil #: 0.2 10*3/uL — ABNORMAL HIGH (ref 0.0–0.1)
Basophil %: 1.2 %
Eosinophil #: 0.1 10*3/uL (ref 0.0–0.7)
Eosinophil %: 0.6 %
HCT: 41.1 % (ref 35.0–47.0)
HGB: 14.2 g/dL (ref 12.0–16.0)
LYMPHS ABS: 2.1 10*3/uL (ref 1.0–3.6)
Lymphocyte %: 16.4 %
MCH: 30.6 pg (ref 26.0–34.0)
MCHC: 34.4 g/dL (ref 32.0–36.0)
MCV: 89 fL (ref 80–100)
MONOS PCT: 4.8 %
Monocyte #: 0.6 x10 3/mm (ref 0.2–0.9)
NEUTROS ABS: 9.9 10*3/uL — AB (ref 1.4–6.5)
Neutrophil %: 77 %
PLATELETS: 260 10*3/uL (ref 150–440)
RBC: 4.63 10*6/uL (ref 3.80–5.20)
RDW: 12.9 % (ref 11.5–14.5)
WBC: 12.8 10*3/uL — AB (ref 3.6–11.0)

## 2013-03-18 LAB — DRUG SCREEN, URINE
AMPHETAMINES, UR SCREEN: NEGATIVE (ref ?–1000)
BENZODIAZEPINE, UR SCRN: NEGATIVE (ref ?–200)
Barbiturates, Ur Screen: NEGATIVE (ref ?–200)
CANNABINOID 50 NG, UR ~~LOC~~: NEGATIVE (ref ?–50)
COCAINE METABOLITE, UR ~~LOC~~: NEGATIVE (ref ?–300)
MDMA (Ecstasy)Ur Screen: NEGATIVE (ref ?–500)
METHADONE, UR SCREEN: NEGATIVE (ref ?–300)
OPIATE, UR SCREEN: NEGATIVE (ref ?–300)
Phencyclidine (PCP) Ur S: NEGATIVE (ref ?–25)
TRICYCLIC, UR SCREEN: NEGATIVE (ref ?–1000)

## 2013-03-18 LAB — CK TOTAL AND CKMB (NOT AT ARMC)
CK, TOTAL: 109 U/L
CK-MB: 1.7 ng/mL (ref 0.5–3.6)

## 2013-03-18 LAB — COMPREHENSIVE METABOLIC PANEL
ALBUMIN: 3.9 g/dL (ref 3.4–5.0)
Alkaline Phosphatase: 87 U/L
Anion Gap: 7 (ref 7–16)
BUN: 12 mg/dL (ref 7–18)
Bilirubin,Total: 0.2 mg/dL (ref 0.2–1.0)
CALCIUM: 9.4 mg/dL (ref 8.5–10.1)
CHLORIDE: 106 mmol/L (ref 98–107)
CO2: 24 mmol/L (ref 21–32)
CREATININE: 0.96 mg/dL (ref 0.60–1.30)
Glucose: 141 mg/dL — ABNORMAL HIGH (ref 65–99)
OSMOLALITY: 276 (ref 275–301)
Potassium: 3.9 mmol/L (ref 3.5–5.1)
SGOT(AST): 28 U/L (ref 15–37)
SGPT (ALT): 37 U/L (ref 12–78)
Sodium: 137 mmol/L (ref 136–145)
TOTAL PROTEIN: 7.8 g/dL (ref 6.4–8.2)

## 2013-03-18 LAB — TROPONIN I: Troponin-I: <0.02 ng/mL

## 2013-04-27 ENCOUNTER — Ambulatory Visit: Payer: Self-pay | Admitting: Pain Medicine

## 2013-04-27 LAB — MAGNESIUM: Magnesium: 1.8 mg/dL

## 2013-04-27 LAB — SEDIMENTATION RATE: ERYTHROCYTE SED RATE: 23 mm/h — AB (ref 0–20)

## 2013-06-05 ENCOUNTER — Ambulatory Visit: Payer: Self-pay | Admitting: Pain Medicine

## 2013-06-13 ENCOUNTER — Ambulatory Visit: Payer: Self-pay | Admitting: Pain Medicine

## 2013-06-22 ENCOUNTER — Ambulatory Visit: Payer: Self-pay | Admitting: Pain Medicine

## 2013-08-07 ENCOUNTER — Ambulatory Visit: Payer: Self-pay | Admitting: Pain Medicine

## 2013-09-05 ENCOUNTER — Ambulatory Visit: Payer: Self-pay | Admitting: Pain Medicine

## 2013-12-01 ENCOUNTER — Ambulatory Visit: Payer: Self-pay | Admitting: Pain Medicine

## 2014-04-03 ENCOUNTER — Ambulatory Visit: Payer: Self-pay | Admitting: Pain Medicine

## 2014-04-26 DIAGNOSIS — Z794 Long term (current) use of insulin: Secondary | ICD-10-CM | POA: Insufficient documentation

## 2014-04-26 DIAGNOSIS — E1165 Type 2 diabetes mellitus with hyperglycemia: Secondary | ICD-10-CM | POA: Insufficient documentation

## 2014-04-26 DIAGNOSIS — E781 Pure hyperglyceridemia: Secondary | ICD-10-CM | POA: Insufficient documentation

## 2014-05-25 NOTE — Op Note (Signed)
PATIENT NAME:  Kelly Rollins, Kelly Rollins MR#:  [id number] DATE OF BIRTH:  1970/08/14  DATE OF PROCEDURE:  01/05/2013  PREOPERATIVE DIAGNOSIS: Right carpal tunnel syndrome.   POSTOPERATIVE DIAGNOSIS: Right carpal tunnel syndrome.   PROCEDURE: Right carpal tunnel release.   ANESTHESIA: General.   SURGEON: Laurene Footman, M.D.   DESCRIPTION OF PROCEDURE: The patient was brought to the operating room and after adequate general anesthesia was obtained, the right arm was prepped and draped in the usual sterile fashion with a tourniquet applied to the upper arm. After appropriate patient identification and timeout procedures were completed, the tourniquet was raised to 250 mmHg. An approximately 2.5 cm incision was made in line with the ring metacarpal. Skin and subcutaneous tissue were spread and the transverse carpal ligament identified and incised longitudinally. A hemostat was placed underneath it to protect the underlying structures. Release was carried out first distally and then proximally. In the proximal portion of the canal, there was significant constriction at the level of the proximal wrist flexion crease as well as at some hourglass constriction in the mid carpal tunnel. After release proximally, there was good vascular blush to the nerve. There was adequate release. There was mild to moderate flexor tenosynovitis. No masses within the carpal tunnel. The wound was irrigated and then infiltrated with 10 mL of 0.5% Sensorcaine without epinephrine. The wound was closed with simple interrupted 5-0 nylon followed by Xeroform, 4 x 4's, Webril and Ace. The tourniquet was let down. The patient was sent to the recovery room in stable condition.   ESTIMATED BLOOD LOSS: Minimal.   COMPLICATIONS: None.   SPECIMEN: None.   TOURNIQUET TIME: 12 minutes at 250 mmHg.   ____________________________ Laurene Footman, MD mjm:gb D: 01/05/2013 20:45:11 ET T: 01/05/2013 22:20:01 ET JOB#: [id number]  cc: Laurene Footman, MD, <Dictator> Laurene Footman MD ELECTRONICALLY SIGNED 01/06/2013 1:36

## 2014-08-30 ENCOUNTER — Encounter: Payer: Self-pay | Admitting: Gastroenterology

## 2015-03-11 ENCOUNTER — Encounter: Payer: Self-pay | Admitting: Emergency Medicine

## 2015-03-11 ENCOUNTER — Emergency Department
Admission: EM | Admit: 2015-03-11 | Discharge: 2015-03-11 | Disposition: A | Discharge status: Home / Self Care | Payer: BLUE CROSS/BLUE SHIELD | Attending: Emergency Medicine | Admitting: Emergency Medicine

## 2015-03-11 ENCOUNTER — Emergency Department: Payer: BLUE CROSS/BLUE SHIELD

## 2015-03-11 DIAGNOSIS — S3992XA Unspecified injury of lower back, initial encounter: Secondary | ICD-10-CM | POA: Diagnosis present

## 2015-03-11 DIAGNOSIS — Y9289 Other specified places as the place of occurrence of the external cause: Secondary | ICD-10-CM | POA: Insufficient documentation

## 2015-03-11 DIAGNOSIS — W108XXA Fall (on) (from) other stairs and steps, initial encounter: Secondary | ICD-10-CM | POA: Diagnosis not present

## 2015-03-11 DIAGNOSIS — F17200 Nicotine dependence, unspecified, uncomplicated: Secondary | ICD-10-CM | POA: Insufficient documentation

## 2015-03-11 DIAGNOSIS — S32018A Other fracture of first lumbar vertebra, initial encounter for closed fracture: Secondary | ICD-10-CM | POA: Insufficient documentation

## 2015-03-11 DIAGNOSIS — Z880 Allergy status to penicillin: Secondary | ICD-10-CM | POA: Diagnosis not present

## 2015-03-11 DIAGNOSIS — Z79899 Other long term (current) drug therapy: Secondary | ICD-10-CM | POA: Insufficient documentation

## 2015-03-11 DIAGNOSIS — I10 Essential (primary) hypertension: Secondary | ICD-10-CM | POA: Insufficient documentation

## 2015-03-11 DIAGNOSIS — Y9301 Activity, walking, marching and hiking: Secondary | ICD-10-CM | POA: Diagnosis not present

## 2015-03-11 DIAGNOSIS — S199XXA Unspecified injury of neck, initial encounter: Secondary | ICD-10-CM | POA: Insufficient documentation

## 2015-03-11 DIAGNOSIS — Y998 Other external cause status: Secondary | ICD-10-CM | POA: Insufficient documentation

## 2015-03-11 DIAGNOSIS — S32009A Unspecified fracture of unspecified lumbar vertebra, initial encounter for closed fracture: Secondary | ICD-10-CM

## 2015-03-11 MED ORDER — ONDANSETRON HCL 4 MG/2ML IJ SOLN
INTRAMUSCULAR | Status: AC
  Administered 2015-03-11: 4 mg via INTRAVENOUS
  Filled 2015-03-11: qty 2

## 2015-03-11 MED ORDER — ONDANSETRON HCL 4 MG/2ML IJ SOLN
4.0000 mg | Freq: Once | INTRAMUSCULAR | Status: AC
  Administered 2015-03-11: 4 mg via INTRAVENOUS

## 2015-03-11 MED ORDER — FENTANYL CITRATE (PF) 100 MCG/2ML IJ SOLN
INTRAMUSCULAR | Status: AC
  Filled 2015-03-11: qty 2

## 2015-03-11 MED ORDER — ONDANSETRON 4 MG PO TBDP
4.0000 mg | ORAL_TABLET | Freq: Three times a day (TID) | ORAL | Status: DC | PRN
Start: 2015-03-11 — End: 2015-05-28

## 2015-03-11 MED ORDER — FENTANYL CITRATE (PF) 100 MCG/2ML IJ SOLN
100.0000 ug | Freq: Once | INTRAMUSCULAR | Status: AC
  Administered 2015-03-11: 100 ug via INTRAVENOUS

## 2015-03-11 MED ORDER — FENTANYL CITRATE (PF) 100 MCG/2ML IJ SOLN
INTRAMUSCULAR | Status: AC
  Administered 2015-03-11: 100 ug via INTRAVENOUS
  Filled 2015-03-11: qty 2

## 2015-03-11 MED ORDER — OXYCODONE-ACETAMINOPHEN 5-325 MG PO TABS
2.0000 | ORAL_TABLET | Freq: Once | ORAL | Status: AC
  Administered 2015-03-11: 2 via ORAL
  Filled 2015-03-11: qty 2

## 2015-03-11 MED ORDER — OXYCODONE-ACETAMINOPHEN 5-325 MG PO TABS: 1.0000 | ORAL_TABLET | Freq: Four times a day (QID) | ORAL | Status: DC | PRN

## 2015-03-11 NOTE — ED Notes (Signed)
Back from ct/x-ray  Family at bedside

## 2015-03-11 NOTE — ED Provider Notes (Signed)
[facility] Emergency Department Provider Note  Time seen: 8:07 AM  I have reviewed the triage vital signs and the nursing notes.   HISTORY  Chief Complaint Fall    HPI Kelly Rollins is a 45 y.o. female with a past medical history of hypertension presents the emergency department after a fall. According to the patient she was walking down her wooden stairs when her foot slipped out from under her, she fell onto her back and slid down all the steps. States she was probably 6 or 7 steps up. Patient denies head injury or loss of consciousness. She does state considerable back pain especially in the lower back. Patient was unable to get up by herself due to the back pain and EMS was called to bring the patient to the emergency department. Patient describes her back pain as severe. Denies any weakness or numbness.    Past Medical History  Diagnosis Date  . Headache(784.0)   . Anal fissure   . Hypertension   . IBS (irritable bowel syndrome)     Patient Active Problem List   Diagnosis Date Noted  . Anxiety 05/15/2010  . Obesity 05/15/2010  . PALPITATIONS, OCCASIONAL 04/17/2010  . HYPERLIPIDEMIA 04/03/2010  . FATTY LIVER DISEASE 04/02/2010  . HYPERCHOLESTEROLEMIA 03/19/2010  . GERD 04/17/2009  . CONSTIPATION 04/17/2009  . HEMATOCHEZIA 04/17/2009  . ABDOMINAL PAIN-LUQ 04/17/2009  . OVARIAN CYST, RIGHT 02/21/2009  . ABDOMINAL PAIN OTHER SPECIFIED SITE 02/21/2009  . ACUTE SINUSITIS, UNSPECIFIED 01/09/2009  . ALLERGIC RHINITIS 12/13/2008  . TOBACCO ABUSE 11/06/2008  . MIGRAINE HEADACHE 11/06/2008  . HEADACHE 11/06/2008    Past Surgical History  Procedure Laterality Date  . Cesarean section    . Vaginal hysterectomy    . Cholecystectomy    . Breast reduction surgery      bilateral    Current Outpatient Rx  Name  Route  Sig  Dispense  Refill  . ALPRAZolam (XANAX) 0.25 MG tablet   Oral   Take 1 tablet (0.25 mg total) by mouth 3 (three) times  daily as needed.   30 tablet   0   . Choline Fenofibrate (TRILIPIX) 135 MG capsule   Oral   Take 135 mg by mouth daily.           . fenofibrate 160 MG tablet   Oral   Take 160 mg by mouth daily.           Marland Kitchen glycopyrrolate (ROBINUL) 1 MG tablet   Oral   Take 1 mg by mouth 2 (two) times daily.           . niacin (NIASPAN) 500 MG CR tablet   Oral   Take 500 mg by mouth 2 (two) times daily.           Marland Kitchen omeprazole (PRILOSEC) 40 MG capsule   Oral   Take 40 mg by mouth daily.           Marland Kitchen EXPIRED: simvastatin (ZOCOR) 10 MG tablet   Oral   Take 1 tablet (10 mg total) by mouth at bedtime.   30 tablet   11     Allergies Morphine and Penicillins  Family History  Problem Relation Age of Onset  . Cancer Mother     throat in the 70's  . Hyperlipidemia Mother   . Hypertension Mother   . Hyperlipidemia Father   . Hypertension Father   . Heart disease Father     S/P two stents  Social History Social History  Substance Use Topics  . Smoking status: Current Every Day Smoker  . Smokeless tobacco: None  . Alcohol Use: Yes    Review of Systems Constitutional: Negative for fever. Cardiovascular: Negative for chest pain. Respiratory: Negative for shortness of breath. Gastrointestinal: Negative for abdominal pain Neurological: Negative for headache 10-point ROS otherwise negative.  ____________________________________________   PHYSICAL EXAM:  VITAL SIGNS: ED Triage Vitals  Enc Vitals Group     BP 03/11/15 0757 117/73 mmHg     Pulse Rate 03/11/15 0757 80     Resp 03/11/15 0757 16     Temp 03/11/15 0757 97.8 F (36.6 C)     Temp Source 03/11/15 0757 Oral     SpO2 03/11/15 0757 96 %     Weight 03/11/15 0757 183 lb (83.008 kg)     Height 03/11/15 0757 5\' 4"  (1.626 m)     Head Cir --      Peak Flow --      Pain Score 03/11/15 0757 3     Pain Loc --      Pain Edu? --      Excl. in [place]? --     Constitutional: Alert and oriented. Well appearing and in  no distress. Eyes: Normal exam ENT   Head: Normocephalic and atraumatic. Moderate C-spine tenderness palpation, cervical collar in place.   Mouth/Throat: Mucous membranes are moist. Cardiovascular: Normal rate, regular rhythm. No murmur Respiratory: Normal respiratory effort without tachypnea nor retractions. Breath sounds are clear . Chest is nontender to palpation. Gastrointestinal: Soft and nontender. No distention. Musculoskeletal:  Moderate cervical and lumbar tenderness to palpation. No deformity noted. Neurologic:  Normal speech and language. No gross focal neurologic deficits. 5/5 motor in bilateral lower extremities, 5/5 in upper extremities, sensation intact and equal. Skin:  Skin is warm, dry and intact.  Psychiatric: Mood and affect are normal. Speech and behavior are normal.   ____________________________________________   RADIOLOGY  CT scan show a nondisplaced L1 transverse process fracture.  ____________________________________________    INITIAL IMPRESSION / ASSESSMENT AND PLAN / ED COURSE  Pertinent labs & imaging results that were available during my care of the patient were reviewed by me and considered in my medical decision making (see chart for details).  Patient presents after a fall down her stairs. Denies loss of consciousness. Patient has significant back and neck pain. Neurovascularly intact. We'll check CT scans of the patient's neck and back to rule out fracture, and treat her pain, and monitor closely in the emergency department.  CT scan show a nondisplaced transverse process fracture of L1. Otherwise negative. We will treat the patient's pain with Percocet, nausea was Zofran if needed, have her follow-up with orthopedics if pain continues. Patient agreeable to plan.  ____________________________________________   FINAL CLINICAL IMPRESSION(S) / ED DIAGNOSES  Fall Transverse process fracture of L1   Harvest Dark, MD 03/11/15 [id number]

## 2015-03-11 NOTE — Discharge Instructions (Signed)
Transverse Process Fracture Each bone of the spine (vertebra) has portions of bone that extend off to either side of the spine. These portions of bone are called transverse processes. A transverse process fracture, which is also called a rotation spine fracture, is a break in a transverse process. CAUSES This condition may be caused by:  A fall from a height.  A car accident.  A sports injury.  A gunshot wound.  A hard, direct hit to the back. This kind of fracture often results from a sudden and severe bending of the spine to one side. RISK FACTORS This condition is more likely to develop in:  People who have thinning and loss of density in the bones (osteoporosis).  People who play a contact sport. SYMPTOMS The main symptom of this condition is back pain. The pain may be felt on the side of the spine (flank) where the fracture is. It may get worse when you move or take deep a deep breath. DIAGNOSIS This condition may be diagnosed based on symptoms, a medical history, and a physical exam. During the physical exam, your health care provider may tap along the length of your spine to see where you feel pain. Imaging tests may be done to confirm the diagnosis. They may include:  X-rays.  A CT scan.  MRI. TREATMENT Most transverse process fractures heal on their own with time and with rest. Treatment may involve supportive care, such as:  A back brace.  Activity limits.  Pain medicine.  Muscle-relaxing medicine.  Physical therapy. HOME CARE INSTRUCTIONS General Instructions  Take medicines only as directed by your health care provider.  Do not drive or operate heavy machinery while taking pain medicine.  Wear your neck or back brace as directed by your health care provider.  Keep all follow-up visits as directed by your health care provider. This is important. It can help to prevent permanent injury, disability, and long-lasting (chronic) pain. Activity  Stay in bed  (on bed rest) only as directed by your health care provider. Being on bed rest for too long can make your condition worse.  Return to your normal activities when your health care provider says it is okay. Ask if there are any activities that you should not do.  Do your physical therapy as recommended by your health care provider. SEEK MEDICAL CARE IF:  You have a fever.  You develop a cough that makes your pain worse.  Your pain medicine is not helping.  Your pain does not get better over time.  You cannot return to your normal activities as planned or expected. SEEK IMMEDIATE MEDICAL CARE IF:  Your pain is very bad and it suddenly gets worse.  You are unable to move any body part (paralysis) that is below the level of your injury.  You have numbness, tingling, or weakness in any body part that is below the level of your injury.  You cannot control your bladder or bowels.   This information is not intended to replace advice given to you by your health care provider. Make sure you discuss any questions you have with your health care provider.   Document Released: 05/06/2006 Document Revised: 06/05/2014 Document Reviewed: 01/23/2014 Elsevier Interactive Patient Education [employer].

## 2015-03-11 NOTE — ED Notes (Signed)
Brought in via ems from home s/p fall   Having pain to left flank and back of head  Denies any LOC  c-collar in place per EMS

## 2015-04-29 ENCOUNTER — Ambulatory Visit: Payer: BLUE CROSS/BLUE SHIELD | Admitting: Pain Medicine

## 2015-05-01 ENCOUNTER — Encounter: Payer: Self-pay | Admitting: Pain Medicine

## 2015-05-01 ENCOUNTER — Ambulatory Visit: Payer: BLUE CROSS/BLUE SHIELD | Attending: Pain Medicine | Admitting: Pain Medicine

## 2015-05-01 VITALS — BP 143/81 | HR 95 | Temp 98.4°F | Resp 16 | Ht 64.0 in | Wt 188.0 lb

## 2015-05-01 DIAGNOSIS — Z0189 Encounter for other specified special examinations: Secondary | ICD-10-CM

## 2015-05-01 DIAGNOSIS — M25552 Pain in left hip: Secondary | ICD-10-CM | POA: Diagnosis not present

## 2015-05-01 DIAGNOSIS — F17200 Nicotine dependence, unspecified, uncomplicated: Secondary | ICD-10-CM | POA: Insufficient documentation

## 2015-05-01 DIAGNOSIS — M549 Dorsalgia, unspecified: Secondary | ICD-10-CM | POA: Diagnosis present

## 2015-05-01 DIAGNOSIS — Z79899 Other long term (current) drug therapy: Secondary | ICD-10-CM

## 2015-05-01 DIAGNOSIS — Z5181 Encounter for therapeutic drug level monitoring: Secondary | ICD-10-CM

## 2015-05-01 DIAGNOSIS — E785 Hyperlipidemia, unspecified: Secondary | ICD-10-CM | POA: Insufficient documentation

## 2015-05-01 DIAGNOSIS — G8929 Other chronic pain: Secondary | ICD-10-CM

## 2015-05-01 DIAGNOSIS — Z9889 Other specified postprocedural states: Secondary | ICD-10-CM | POA: Insufficient documentation

## 2015-05-01 DIAGNOSIS — M545 Low back pain, unspecified: Secondary | ICD-10-CM | POA: Insufficient documentation

## 2015-05-01 DIAGNOSIS — M25562 Pain in left knee: Secondary | ICD-10-CM | POA: Diagnosis not present

## 2015-05-01 DIAGNOSIS — M791 Myalgia: Secondary | ICD-10-CM | POA: Insufficient documentation

## 2015-05-01 DIAGNOSIS — K219 Gastro-esophageal reflux disease without esophagitis: Secondary | ICD-10-CM

## 2015-05-01 DIAGNOSIS — I10 Essential (primary) hypertension: Secondary | ICD-10-CM | POA: Diagnosis not present

## 2015-05-01 DIAGNOSIS — E781 Pure hyperglyceridemia: Secondary | ICD-10-CM | POA: Insufficient documentation

## 2015-05-01 DIAGNOSIS — E7800 Pure hypercholesterolemia, unspecified: Secondary | ICD-10-CM | POA: Diagnosis not present

## 2015-05-01 DIAGNOSIS — M792 Neuralgia and neuritis, unspecified: Secondary | ICD-10-CM

## 2015-05-01 DIAGNOSIS — M25551 Pain in right hip: Secondary | ICD-10-CM | POA: Insufficient documentation

## 2015-05-01 DIAGNOSIS — Z79891 Long term (current) use of opiate analgesic: Secondary | ICD-10-CM

## 2015-05-01 DIAGNOSIS — F1190 Opioid use, unspecified, uncomplicated: Secondary | ICD-10-CM

## 2015-05-01 DIAGNOSIS — K760 Fatty (change of) liver, not elsewhere classified: Secondary | ICD-10-CM | POA: Insufficient documentation

## 2015-05-01 DIAGNOSIS — M47816 Spondylosis without myelopathy or radiculopathy, lumbar region: Secondary | ICD-10-CM | POA: Diagnosis not present

## 2015-05-01 DIAGNOSIS — M7918 Myalgia, other site: Secondary | ICD-10-CM

## 2015-05-01 DIAGNOSIS — E669 Obesity, unspecified: Secondary | ICD-10-CM | POA: Insufficient documentation

## 2015-05-01 DIAGNOSIS — K589 Irritable bowel syndrome without diarrhea: Secondary | ICD-10-CM | POA: Diagnosis not present

## 2015-05-01 DIAGNOSIS — M25561 Pain in right knee: Secondary | ICD-10-CM | POA: Insufficient documentation

## 2015-05-01 DIAGNOSIS — K5900 Constipation, unspecified: Secondary | ICD-10-CM

## 2015-05-01 DIAGNOSIS — E119 Type 2 diabetes mellitus without complications: Secondary | ICD-10-CM | POA: Insufficient documentation

## 2015-05-01 DIAGNOSIS — M25559 Pain in unspecified hip: Secondary | ICD-10-CM | POA: Diagnosis present

## 2015-05-01 DIAGNOSIS — M25569 Pain in unspecified knee: Secondary | ICD-10-CM | POA: Diagnosis present

## 2015-05-01 MED ORDER — METHOCARBAMOL 500 MG PO TABS: 500.0000 mg | ORAL_TABLET | Freq: Three times a day (TID) | ORAL | Status: DC | PRN

## 2015-05-01 MED ORDER — HYDROCODONE-ACETAMINOPHEN 5-325 MG PO TABS: 1.0000 | ORAL_TABLET | Freq: Every day | ORAL | Status: DC | PRN

## 2015-05-01 MED ORDER — GABAPENTIN 100 MG PO CAPS: 100.0000 mg | ORAL_CAPSULE | Freq: Every day | ORAL | Status: DC

## 2015-05-01 NOTE — Progress Notes (Signed)
Safety precautions to be maintained throughout the outpatient stay will include: orient to surroundings, keep bed in low position, maintain call bell within reach at all times, provide assistance with transfer out of bed and ambulation.  

## 2015-05-01 NOTE — Progress Notes (Deleted)
Patient's Name: Kelly Rollins MRN: [id number] DOB: May 16, 1970 DOS: 05/01/2015  Primary Reason(s) for Visit: Evaluation of a New Problem CC: Hip Pain; Back Pain; and Knee Pain   HPI  Kelly Rollins is a 45 y.o. year old, female patient, who returns today as an established patient. She has TOBACCO ABUSE; GERD; Constipation; Pure hypercholesterolemia; Hyperlipidemia; Fatty liver disease; Anxiety; Obesity; Essential hypertriglyceridemia; Chronic pain; Long term current use of opiate analgesic; Long term prescription opiate use; Opiate use; Encounter for therapeutic drug level monitoring; Encounter for pain management planning; Chronic low back pain (Location of Primary Source of Pain) (Bilateral) (L>R); Lumbar facet syndrome (Location of Primary Source of Pain) (Bilateral) (L>R); Chronic hip pain (Location of Secondary source of pain) (Bilateral) (L>R); Chronic knee pain (Location of Tertiary source of pain) (Bilateral) (L>R); Lumbar spondylosis; Neuropathic pain; Neurogenic pain; and Myofascial pain on her problem list.. Her primarily concern today is the Hip Pain; Back Pain; and Knee Pain   The patient returns to the clinics today after last having been seen on 12/01/2012.  Pain Assessment: Self-Reported Pain Score: 4  Reported level is compatible with observation Pain Type: Chronic pain Pain Location: Back Pain Orientation: Lower Pain Descriptors / Indicators:  (electric, intense) Pain Frequency: Intermittent  Date of Last Visit: 12/01/12 Service Provided on Last Visit: Evaluation  Controlled Substance Pharmacotherapy Assessment  Analgesic: *** Pill Count: *** MME/day: *** Pharmacokinetics: Onset of action (Liberation/Absorption): Within expected pharmacological parameters Time to Peak effect (Distribution): Timing and results are as within normal expected parameters Duration of action (Metabolism/Excretion): Within normal limits for medication Pharmacodynamics: Analgesic Effect:  More than 50% Activity Facilitation: Medication(s) allow patient to sit, stand, walk, and do the basic ADLs Perceived Effectiveness: Described as relatively effective, allowing for increase in activities of daily living (ADL) Side-effects or Adverse reactions: None reported Monitoring: [place] PMP: Online review of the past 59-month period conducted. Compliant with practice rules and regulations UDS Results/interpretation: *** Medication Assessment Form: Reviewed. Patient indicates being compliant with therapy Treatment compliance: Compliant Risk Assessment: Aberrant Behavior: None observed today Substance Use Disorder (SUD) Risk Level: Low Opioid Risk Tool (ORT) Score: Total Score: 1 Low Risk for SUD (Score <3) Depression Scale Score: PHQ-2: PHQ-2 Total Score: 0 No depression (0) PHQ-9: PHQ-9 Total Score: 0 No depression (0-4)  Pharmacologic Plan: No change in therapy, at this time   Laboratory Workup  Last ED UDS: Lab Results  Component Value Date   THCU NEGATIVE 03/18/2013   PCPSCRNUR NEGATIVE 03/18/2013   MDMA NEGATIVE 03/18/2013   AMPHETMU NEGATIVE 03/18/2013   METHADONE NEGATIVE 03/18/2013    Inflammation Markers Lab Results  Component Value Date   ESRSEDRATE 23* 04/27/2013    Renal Function Lab Results  Component Value Date   BUN 12 03/18/2013   CREATININE 0.96 03/18/2013   GFRAA >60 03/18/2013   GFRNONAA >60 03/18/2013    Hepatic Function Lab Results  Component Value Date   AST 28 03/18/2013   ALT 37 03/18/2013   ALBUMIN 3.9 03/18/2013    Electrolytes Lab Results  Component Value Date   NA 137 03/18/2013   K 3.9 03/18/2013   CL 106 03/18/2013   CALCIUM 9.4 03/18/2013   MG 1.8 04/27/2013    Implant Therapy Assessment  Intrathecal Pump Therapy: Side-effects or Adverse reactions: None reported Effectiveness: Described as relatively effective, allowing for increase in activities of daily living (ADL) Plan: No changes in programming  Post-Procedure  Assessment  Procedure done on last visit: *** Side-effects or Adverse reactions:  None reported Sedation: Please see nurses note  Results:    Analgesia during this period is likely to be Local Anesthetic and/or IV Sedative (Analgesic/Anxiolitic) related   Complete relief confirms area to be the source of pain   Long-term benefit would suggest an inflammatory etiology to the pain   Current Relief (Now):         Persistent relief would suggest effective anti-inflammatory effects from steroids Interpretation of Results: ***  Allergies  Kelly Rollins is allergic to morphine; penicillins; and tramadol hcl.  Meds  The patient has a current medication list which includes the following prescription(s): vitamin c, b complex vitamins, hydrocodone-acetaminophen, meloxicam, multivitamin, ondansetron, gabapentin, and methocarbamol.  Current Outpatient Prescriptions on File Prior to Visit  Medication Sig  . ondansetron (ZOFRAN ODT) 4 MG disintegrating tablet Take 1 tablet (4 mg total) by mouth every 8 (eight) hours as needed for nausea or vomiting.   No current facility-administered medications on file prior to visit.    ROS  Constitutional: Afebrile, no chills, well hydrated and well nourished Gastrointestinal: negative Musculoskeletal:negative Neurological: negative Behavioral/Psych: negative  [place]  Medical:  Kelly Rollins  has a past medical history of Headache(784.0); Anal fissure; Hypertension; IBS (irritable bowel syndrome); ABDOMINAL PAIN-LUQ (04/17/2009); ABDOMINAL PAIN OTHER SPECIFIED SITE (02/21/2009); Migraine headache (11/06/2008); HEMATOCHEZIA (04/17/2009); PALPITATIONS, OCCASIONAL (04/17/2010); and OVARIAN CYST, RIGHT (02/21/2009). Family: family history includes Cancer in her mother; Heart disease in her father; Hyperlipidemia in her father and mother; Hypertension in her father and mother. Surgical:  has past surgical history that includes Cesarean section; Vaginal hysterectomy;  Cholecystectomy; and Breast reduction surgery. Tobacco:  reports that she has been smoking.  She does not have any smokeless tobacco history on file. Alcohol:  reports that she drinks alcohol. Drug:  has no drug history on file.  Physical Exam  Vitals:  Today's Vitals   05/01/15 0912 05/01/15 0915  BP: 143/81   Pulse: 95   Temp: 98.4 F (36.9 C)   TempSrc: Oral   Resp: 16   Height: 5\' 4"  (1.626 m)   Weight: 188 lb (85.276 kg)   SpO2: 99%   PainSc:  4     Calculated BMI: Body mass index is 32.25 kg/(m^2).     General appearance: {general exam:16600} Eyes: PERLA Respiratory: No evidence respiratory distress, no audible rales or ronchi and no use of accessory muscles of respiration  Cervical Spine Inspection: Normal anatomy Alignment: Symetrical ROM: Adequate  Upper Extremities Inspection: No gross anomalies detected ROM: Adequate Sensory: Normal Motor: Unremarkable  Thoracic Spine Inspection: No gross anomalies detected Alignment: Symetrical ROM: Adequate  Lumbar Spine Inspection: No gross anomalies detected Alignment: Symetrical ROM: Adequate  Gait: WNL  Lower Extremities Inspection: No gross anomalies detected ROM: Adequate Sensory:  Normal Motor: Unremarkable  Assessment & Plan  Primary Diagnosis & Pertinent Problem List: The primary encounter diagnosis was Chronic pain. Diagnoses of Long term current use of opiate analgesic, Long term prescription opiate use, Opiate use, Encounter for therapeutic drug level monitoring, Encounter for pain management planning, Chronic low back pain (Location of Primary Source of Pain) (Bilateral) (L>R), Lumbar facet syndrome (Location of Primary Source of Pain) (Bilateral) (L>R), Chronic hip pain, unspecified laterality, Chronic knee pain (Location of Tertiary source of pain) (Bilateral) (L>R), Lumbar spondylosis, unspecified spinal osteoarthritis, Neuropathic pain, Neurogenic pain, Myofascial pain, Constipation, unspecified  constipation type, Gastroesophageal reflux disease without esophagitis, and HYPERCHOLESTEROLEMIA were also pertinent to this visit.  Visit Diagnosis: 1. Chronic pain   2. Long term current use  of opiate analgesic   3. Long term prescription opiate use   4. Opiate use   5. Encounter for therapeutic drug level monitoring   6. Encounter for pain management planning   7. Chronic low back pain (Location of Primary Source of Pain) (Bilateral) (L>R)   8. Lumbar facet syndrome (Location of Primary Source of Pain) (Bilateral) (L>R)   9. Chronic hip pain, unspecified laterality   10. Chronic knee pain (Location of Tertiary source of pain) (Bilateral) (L>R)   11. Lumbar spondylosis, unspecified spinal osteoarthritis   12. Neuropathic pain   13. Neurogenic pain   14. Myofascial pain   15. Constipation, unspecified constipation type   16. Gastroesophageal reflux disease without esophagitis   17. HYPERCHOLESTEROLEMIA     Problem-specific Plan(s): No problem-specific assessment & plan notes found for this encounter.   Plan of Care  Pharmacotherapy (Medications Ordered): Meds ordered this encounter  Medications  . HYDROcodone-acetaminophen (NORCO/VICODIN) 5-325 MG tablet    Sig: Take 1 tablet by mouth 5 (five) times daily as needed for moderate pain or severe pain.    Dispense:  150 tablet    Refill:  0    Do not add this medication to the electronic "Automatic Refill" notification system. Patient may have prescription filled one day early if pharmacy is closed on scheduled refill date. Do not fill until: 05/01/15 To last until: 05/31/15  . methocarbamol (ROBAXIN) 500 MG tablet    Sig: Take 1 tablet (500 mg total) by mouth every 8 (eight) hours as needed for muscle spasms.    Dispense:  90 tablet    Refill:  0    Do not place this medication, or any other prescription from our practice, on "Automatic Refill". Patient may have prescription filled one day early if pharmacy is closed on  scheduled refill date.  . gabapentin (NEURONTIN) 100 MG capsule    Sig: Take 1-3 capsules (100-300 mg total) by mouth at bedtime.    Dispense:  90 capsule    Refill:  0    Do not place this medication, or any other prescription from our practice, on "Automatic Refill". Patient may have prescription filled one day early if pharmacy is closed on scheduled refill date.    Lab-work & Procedure Ordered: Orders Placed This Encounter  Procedures  . LUMBAR FACET(MEDIAL BRANCH NERVE BLOCK) MBNB    Standing Status: Future     Number of Occurrences:      Standing Expiration Date: 04/30/2016    Scheduling Instructions:     Side: Bilateral     Level: L2, L3, L4, L5, & S1 Medial Branch Nerve     Sedation: With Sedation.     Timeframe: ASAA    Order Specific Question:  Where will this procedure be performed?    Answer:  [facility]  . DG Knee 1-2 Views Left    Standing Status: Future     Number of Occurrences:      Standing Expiration Date: 04/30/2016    Order Specific Question:  Reason for Exam (SYMPTOM  OR DIAGNOSIS REQUIRED)    Answer:  Left knee pain/arthralgia    Order Specific Question:  Is the patient pregnant?    Answer:  No    Order Specific Question:  Preferred imaging location?    Answer:  [facility]    Order Specific Question:  Call Results- Best Contact Number?    Answer:  [phone] (Pain Clinic facility) (Dr. Dossie Arbour)  . DG Knee 1-2 Views  Right    Standing Status: Future     Number of Occurrences:      Standing Expiration Date: 04/30/2016    Order Specific Question:  Reason for Exam (SYMPTOM  OR DIAGNOSIS REQUIRED)    Answer:  Left knee pain/arthralgia    Order Specific Question:  Is the patient pregnant?    Answer:  No    Order Specific Question:  Preferred imaging location?    Answer:  [facility]    Order Specific Question:  Call Results- Best Contact Number?    Answer:  [phone] (Pain Clinic facility) (Dr. Dossie Arbour)  . ToxASSURE Select 13  (MW), Urine    Volume: 30 ml(s). Minimum 3 ml of urine is needed. Document temperature of fresh sample. Indications: Long term (current) use of opiate analgesic (Z79.891)  . Comprehensive metabolic panel    Standing Status: Future     Number of Occurrences:      Standing Expiration Date: 05/31/2015    Order Specific Question:  Has the patient fasted?    Answer:  No  . C-reactive protein    Standing Status: Future     Number of Occurrences:      Standing Expiration Date: 05/31/2015  . Magnesium    Standing Status: Future     Number of Occurrences:      Standing Expiration Date: 05/31/2015  . Sedimentation rate    Standing Status: Future     Number of Occurrences:      Standing Expiration Date: 05/31/2015  . Vitamin B12    Standing Status: Future     Number of Occurrences:      Standing Expiration Date: 05/31/2015  . Vitamin D 1,25 dihydroxy    Standing Status: Future     Number of Occurrences:      Standing Expiration Date: 05/31/2015    Imaging Ordered: DG KNEE 1-2 VIEWS LEFT DG KNEE 1-2 VIEWS RIGHT  Interventional Therapies: Scheduled: *** PRN Procedures: ***   Referral(s) or Consult(s): ***  Medications administered during this visit: Ms. Polhill had no medications administered during this visit.  Future Appointments Date Time Provider [place]  05/28/2015 1:40 PM Milinda Pointer, MD [facility] None    Primary Care Physician: Leonel Ramsay, MD Location: [facility] Note by: Kathlen Brunswick Dossie Arbour, M.D, DABA, DABAPM, DABPM, DABIPP, FIPP  Pain Score Disclaimer: We use the NRS-11 scale. This is a self-reported, subjective measurement of pain severity with only modest accuracy. It is used primarily to identify changes within a particular patient. It must be understood that outpatient pain scales are significantly less accurate that those used for research, where they can be applied under ideal controlled circumstances with  minimal exposure to variables. In reality, the score is likely to be a combination of pain intensity and pain affect, where pain affect describes the degree of emotional arousal or changes in action readiness caused by the sensory experience of pain. Factors such as social and work situation, setting, emotional state, anxiety levels, expectation, and prior pain experience may influence pain perception and show large inter-individual differences that may also be affected by time variables.

## 2015-05-01 NOTE — Patient Instructions (Signed)
GENERAL RISKS AND COMPLICATIONS  What are the risk, side effects and possible complications? Generally speaking, most procedures are safe.  However, with any procedure there are risks, side effects, and the possibility of complications.  The risks and complications are dependent upon the sites that are lesioned, or the type of nerve block to be performed.  The closer the procedure is to the spine, the more serious the risks are.  Great care is taken when placing the radio frequency needles, block needles or lesioning probes, but sometimes complications can occur. 1. Infection: Any time there is an injection through the skin, there is a risk of infection.  This is why sterile conditions are used for these blocks.  There are four possible types of infection. 1. Localized skin infection. 2. Central Nervous System Infection-This can be in the form of Meningitis, which can be deadly. 3. Epidural Infections-This can be in the form of an epidural abscess, which can cause pressure inside of the spine, causing compression of the spinal cord with subsequent paralysis. This would require an emergency surgery to decompress, and there are no guarantees that the patient would recover from the paralysis. 4. Discitis-This is an infection of the intervertebral discs.  It occurs in about 1% of discography procedures.  It is difficult to treat and it may lead to surgery.        2. Pain: the needles have to go through skin and soft tissues, will cause soreness.       3. Damage to internal structures:  The nerves to be lesioned may be near blood vessels or    other nerves which can be potentially damaged.       4. Bleeding: Bleeding is more common if the patient is taking blood thinners such as  aspirin, Coumadin, Ticiid, Plavix, etc., or if he/she have some genetic predisposition  such as hemophilia. Bleeding into the spinal canal can cause compression of the spinal  cord with subsequent paralysis.  This would require an  emergency surgery to  decompress and there are no guarantees that the patient would recover from the  paralysis.       5. Pneumothorax:  Puncturing of a lung is a possibility, every time a needle is introduced in  the area of the chest or upper back.  Pneumothorax refers to free air around the  collapsed lung(s), inside of the thoracic cavity (chest cavity).  Another two possible  complications related to a similar event would include: Hemothorax and Chylothorax.   These are variations of the Pneumothorax, where instead of air around the collapsed  lung(s), you may have blood or chyle, respectively.       6. Spinal headaches: They may occur with any procedures in the area of the spine.       7. Persistent CSF (Cerebro-Spinal Fluid) leakage: This is a rare problem, but may occur  with prolonged intrathecal or epidural catheters either due to the formation of a fistulous  track or a dural tear.       8. Nerve damage: By working so close to the spinal cord, there is always a possibility of  nerve damage, which could be as serious as a permanent spinal cord injury with  paralysis.       9. Death:  Although rare, severe deadly allergic reactions known as "Anaphylactic  reaction" can occur to any of the medications used.      10. Worsening of the symptoms:  We can always make thing worse.    What are the chances of something like this happening? Chances of any of this occuring are extremely low.  By statistics, you have more of a chance of getting killed in a motor vehicle accident: while driving to the hospital than any of the above occurring .  Nevertheless, you should be aware that they are possibilities.  In general, it is similar to taking a shower.  Everybody knows that you can slip, hit your head and get killed.  Does that mean that you should not shower again?  Nevertheless always keep in mind that statistics do not mean anything if you happen to be on the wrong side of them.  Even if a procedure has a 1  (one) in a 1,000,000 (million) chance of going wrong, it you happen to be that one..Also, keep in mind that by statistics, you have more of a chance of having something go wrong when taking medications.  Who should not have this procedure? If you are on a blood thinning medication (e.g. Coumadin, Plavix, see list of "Blood Thinners"), or if you have an active infection going on, you should not have the procedure.  If you are taking any blood thinners, please inform your physician.  How should I prepare for this procedure?  Do not eat or drink anything at least six hours prior to the procedure.  Bring a driver with you .  It cannot be a taxi.  Come accompanied by an adult that can drive you back, and that is strong enough to help you if your legs get weak or numb from the local anesthetic.  Take all of your medicines the morning of the procedure with just enough water to swallow them.  If you have diabetes, make sure that you are scheduled to have your procedure done first thing in the morning, whenever possible.  If you have diabetes, take only half of your insulin dose and notify our nurse that you have done so as soon as you arrive at the clinic.  If you are diabetic, but only take blood sugar pills (oral hypoglycemic), then do not take them on the morning of your procedure.  You may take them after you have had the procedure.  Do not take aspirin or any aspirin-containing medications, at least eleven (11) days prior to the procedure.  They may prolong bleeding.  Wear loose fitting clothing that may be easy to take off and that you would not mind if it got stained with Betadine or blood.  Do not wear any jewelry or perfume  Remove any nail coloring.  It will interfere with some of our monitoring equipment.  NOTE: Remember that this is not meant to be interpreted as a complete list of all possible complications.  Unforeseen problems may occur.  BLOOD THINNERS The following drugs  contain aspirin or other products, which can cause increased bleeding during surgery and should not be taken for 2 weeks prior to and 1 week after surgery.  If you should need take something for relief of minor pain, you may take acetaminophen which is found in Tylenol,m Datril, Anacin-3 and Panadol. It is not blood thinner. The products listed below are.  Do not take any of the products listed below in addition to any listed on your instruction sheet.  A.P.C or A.P.C with Codeine Codeine Phosphate Capsules #3 Ibuprofen Ridaura  ABC compound Congesprin Imuran rimadil  Advil Cope Indocin Robaxisal  Alka-Seltzer Effervescent Pain Reliever and Antacid Coricidin or Coricidin-D  Indomethacin Rufen    Alka-Seltzer plus Cold Medicine Cosprin Ketoprofen S-A-C Tablets  Anacin Analgesic Tablets or Capsules Coumadin Korlgesic Salflex  Anacin Extra Strength Analgesic tablets or capsules CP-2 Tablets Lanoril Salicylate  Anaprox Cuprimine Capsules Levenox Salocol  Anexsia-D Dalteparin Magan Salsalate  Anodynos Darvon compound Magnesium Salicylate Sine-off  Ansaid Dasin Capsules Magsal Sodium Salicylate  Anturane Depen Capsules Marnal Soma  APF Arthritis pain formula Dewitt's Pills Measurin Stanback  Argesic Dia-Gesic Meclofenamic Sulfinpyrazone  Arthritis Bayer Timed Release Aspirin Diclofenac Meclomen Sulindac  Arthritis pain formula Anacin Dicumarol Medipren Supac  Analgesic (Safety coated) Arthralgen Diffunasal Mefanamic Suprofen  Arthritis Strength Bufferin Dihydrocodeine Mepro Compound Suprol  Arthropan liquid Dopirydamole Methcarbomol with Aspirin Synalgos  ASA tablets/Enseals Disalcid Micrainin Tagament  Ascriptin Doan's Midol Talwin  Ascriptin A/D Dolene Mobidin Tanderil  Ascriptin Extra Strength Dolobid Moblgesic Ticlid  Ascriptin with Codeine Doloprin or Doloprin with Codeine Momentum Tolectin  Asperbuf Duoprin Mono-gesic Trendar  Aspergum Duradyne Motrin or Motrin IB Triminicin  Aspirin  plain, buffered or enteric coated Durasal Myochrisine Trigesic  Aspirin Suppositories Easprin Nalfon Trillsate  Aspirin with Codeine Ecotrin Regular or Extra Strength Naprosyn Uracel  Atromid-S Efficin Naproxen Ursinus  Auranofin Capsules Elmiron Neocylate Vanquish  Axotal Emagrin Norgesic Verin  Azathioprine Empirin or Empirin with Codeine Normiflo Vitamin E  Azolid Emprazil Nuprin Voltaren  Bayer Aspirin plain, buffered or children's or timed BC Tablets or powders Encaprin Orgaran Warfarin Sodium  Buff-a-Comp Enoxaparin Orudis Zorpin  Buff-a-Comp with Codeine Equegesic Os-Cal-Gesic   Buffaprin Excedrin plain, buffered or Extra Strength Oxalid   Bufferin Arthritis Strength Feldene Oxphenbutazone   Bufferin plain or Extra Strength Feldene Capsules Oxycodone with Aspirin   Bufferin with Codeine Fenoprofen Fenoprofen Pabalate or Pabalate-SF   Buffets II Flogesic Panagesic   Buffinol plain or Extra Strength Florinal or Florinal with Codeine Panwarfarin   Buf-Tabs Flurbiprofen Penicillamine   Butalbital Compound Four-way cold tablets Penicillin   Butazolidin Fragmin Pepto-Bismol   Carbenicillin Geminisyn Percodan   Carna Arthritis Reliever Geopen Persantine   Carprofen Gold's salt Persistin   Chloramphenicol Goody's Phenylbutazone   Chloromycetin Haltrain Piroxlcam   Clmetidine heparin Plaquenil   Cllnoril Hyco-pap Ponstel   Clofibrate Hydroxy chloroquine Propoxyphen         Before stopping any of these medications, be sure to consult the physician who ordered them.  Some, such as Coumadin (Warfarin) are ordered to prevent or treat serious conditions such as "deep thrombosis", "pumonary embolisms", and other heart problems.  The amount of time that you may need off of the medication may also vary with the medication and the reason for which you were taking it.  If you are taking any of these medications, please make sure you notify your pain physician before you undergo any  procedures.         Facet Blocks Patient Information  Description: The facets are joints in the spine between the vertebrae.  Like any joints in the body, facets can become irritated and painful.  Arthritis can also effect the facets.  By injecting steroids and local anesthetic in and around these joints, we can temporarily block the nerve supply to them.  Steroids act directly on irritated nerves and tissues to reduce selling and inflammation which often leads to decreased pain.  Facet blocks may be done anywhere along the spine from the neck to the low back depending upon the location of your pain.   After numbing the skin with local anesthetic (like Novocaine), a small needle is passed onto the facet joints under x-ray guidance.    You may experience a sensation of pressure while this is being done.  The entire block usually lasts about 15-25 minutes.   Conditions which may be treated by facet blocks:   Low back/buttock pain  Neck/shoulder pain  Certain types of headaches  Preparation for the injection:  1. Do not eat any solid food or dairy products within 8 hours of your appointment. 2. You may drink clear liquid up to 3 hours before appointment.  Clear liquids include water, black coffee, juice or soda.  No milk or cream please. 3. You may take your regular medication, including pain medications, with a sip of water before your appointment.  Diabetics should hold regular insulin (if taken separately) and take 1/2 normal NPH dose the morning of the procedure.  Carry some sugar containing items with you to your appointment. 4. A driver must accompany you and be prepared to drive you home after your procedure. 5. Bring all your current medications with you. 6. An IV may be inserted and sedation may be given at the discretion of the physician. 7. A blood pressure cuff, EKG and other monitors will often be applied during the procedure.  Some patients may need to have extra oxygen  administered for a short period. 8. You will be asked to provide medical information, including your allergies and medications, prior to the procedure.  We must know immediately if you are taking blood thinners (like Coumadin/Warfarin) or if you are allergic to IV iodine contrast (dye).  We must know if you could possible be pregnant.  Possible side-effects:   Bleeding from needle site  Infection (rare, may require surgery)  Nerve injury (rare)  Numbness & tingling (temporary)  Difficulty urinating (rare, temporary)  Spinal headache (a headache worse with upright posture)  Light-headedness (temporary)  Pain at injection site (serveral days)  Decreased blood pressure (rare, temporary)  Weakness in arm/leg (temporary)  Pressure sensation in back/neck (temporary)   Call if you experience:   Fever/chills associated with headache or increased back/neck pain  Headache worsened by an upright position  New onset, weakness or numbness of an extremity below the injection site  Hives or difficulty breathing (go to the emergency room)  Inflammation or drainage at the injection site(s)  Severe back/neck pain greater than usual  New symptoms which are concerning to you  Please note:  Although the local anesthetic injected can often make your back or neck feel good for several hours after the injection, the pain will likely return. It takes 3-7 days for steroids to work.  You may not notice any pain relief for at least one week.  If effective, we will often do a series of 2-3 injections spaced 3-6 weeks apart to maximally decrease your pain.  After the initial series, you may be a candidate for a more permanent nerve block of the facets.  If you have any questions, please call [phone] [facility] 

## 2015-05-02 ENCOUNTER — Encounter: Payer: Self-pay | Admitting: Pain Medicine

## 2015-05-02 NOTE — Progress Notes (Signed)
Patient's Name: Kelly Rollins MRN: [id number] DOB: 04/27/1970 DOS: 05/01/2015  Primary Reason(s) for Visit: Initial Patient Evaluation CC: Hip Pain; Back Pain; and Knee Pain   HPI  Kelly Rollins is a 45 y.o. year old, female patient, who comes today for an initial evaluation. She has TOBACCO ABUSE; GERD; Constipation; Pure hypercholesterolemia; Hyperlipidemia; Fatty liver disease; Anxiety; Obesity; Essential hypertriglyceridemia; Chronic pain; Long term current use of opiate analgesic; Long term prescription opiate use; Opiate use; Encounter for therapeutic drug level monitoring; Encounter for pain management planning; Chronic low back pain (Location of Primary Source of Pain) (Bilateral) (L>R); Lumbar facet syndrome (Location of Primary Source of Pain) (Bilateral) (L>R); Chronic hip pain (Location of Secondary source of pain) (Bilateral) (L>R); Chronic knee pain (Location of Tertiary source of pain) (Bilateral) (L>R); Lumbar spondylosis; Neuropathic pain; Neurogenic pain; and Myofascial pain on her problem list.. Her primarily concern today is the Hip Pain; Back Pain; and Knee Pain   The patient comes into the clinics today for evaluation and management of her chronic pain. This patient used to be a patient of mine in the past and the last time I saw her was on 12/01/2012 for an evaluation. Today the patient comes in today clinics today indicating that her primary pain is that of the lower back with the left being worse than the right. Following this is her hip pain with both hips hurting, but the left being worst on the right. The patient also has a history significant for an L1 vertebral body fracture with pain in that area which is also bilateral but with the right being worst on the left. This pain is intermittent and electrical-like in nature. She also has another type of pain in the area which is constant and all. Last time that I saw her we did some lumbar facet blocks with good results and this  was followed by physical therapy which did help her until 03/11/2015 when she fell and aggravated her back. In addition to this pain, she also complains of bilateral knee pain with the left being worst on the right. In both cases the pain in the knee is in the anterior aspect of the knee.  Reported Pain Score: 4  Reported level is inconsistent with clinical obrservations. Pain Type: Chronic pain Pain Location: Back Pain Orientation: Lower Pain Descriptors / Indicators:  (electric, intense) Pain Frequency: Intermittent  Onset and Duration: Gradual, Date of onset: 1-1/2 years and Present longer than 3 months Cause of pain: Failed down her stairs on 03/11/2015 Severity: Getting better, NAS-11 at its worse: 7/10, NAS-11 at its best: 3/10, NAS-11 now: 4/10 and NAS-11 on the average: 5/10 Timing: Not influenced by the time of the day and During activity or exercise Aggravating Factors: Bending, Lifiting, Prolonged sitting, Prolonged standing and Twisting Alleviating Factors: Stretching, Lying down, Medications, Resting and Using a brace Associated Problems: Pain that wakes patient up Quality of Pain: Aching, Deep, Exhausting, Pulsating, Sharp and Stabbing Previous Examinations or Tests: CT scan Previous Treatments: Facet blocks, Narcotic medications, Physical Therapy and Stretching exercises  Historic Controlled Substance Pharmacotherapy Review  Previously Prescribed Opioids:  Analgesic: Oxycodone/APAP 5/325 one to 2 tablets every 6 hours (40 mg/day) MME/day: 60 mg/day Pharmacokinetics: Onset of action (Liberation/Absorption): Within expected pharmacological parameters Time to Peak effect (Distribution): Timing and results are as within normal expected parameters Duration of action (Metabolism/Excretion): Within normal limits for medication Pharmacodynamics: Analgesic Effect: More than 50% Activity Facilitation: Medication(s) allow patient to sit, stand, walk, and do the  basic  ADLs Perceived Effectiveness: Described as relatively effective, allowing for increase in activities of daily living (ADL) Side-effects or Adverse reactions: None reported Historical Background Evaluation: [place] PDMP: Five (5) year initial data search conducted. No abnormal patterns identified [place] [employer] Offender Public Information: Non-contributory Historical Hospital-associated UDS Results:   Lab Results  Component Value Date   THCU NEGATIVE 03/18/2013   PCPSCRNUR NEGATIVE 03/18/2013   MDMA NEGATIVE 03/18/2013   AMPHETMU NEGATIVE 03/18/2013   METHADONE NEGATIVE 03/18/2013   UDS Results: No UDS available, at this time UDS Interpretation: No UDS available, at this time Medication Assessment Form: Not applicable. Initial evaluation. The patient has not received any medications from our practice Treatment compliance: Not applicable. Initial evaluation Risk Assessment: Aberrant Behavior: None observed today Opioid Fatal Overdose Risk Factors: None detected today Substance Use Disorder (SUD) Risk Level: Pending results of Medical Psychology Evaluation for SUD Opioid Risk Tool (ORT) Score: Total Score: 1 Low Risk for SUD (Score <3) Depression Scale Score: PHQ-2: PHQ-2 Total Score: 0 No depression (0) PHQ-9: PHQ-9 Total Score: 0 No depression (0-4)  Pharmacologic Plan: Pending ordered tests and/or consults  Neuromodulation Therapy Review  Type: No neuromodulatory devices implanted Side-effects or Adverse reactions: No device reported Effectiveness: No device reported  Allergies  Kelly Rollins is allergic to morphine; penicillins; and tramadol hcl.  Meds  The patient has a current medication list which includes the following prescription(s): vitamin c, b complex vitamins, hydrocodone-acetaminophen, meloxicam, multivitamin, ondansetron, gabapentin, and methocarbamol. Requested Prescriptions   Signed Prescriptions Disp Refills  . HYDROcodone-acetaminophen  (NORCO/VICODIN) 5-325 MG tablet 150 tablet 0    Sig: Take 1 tablet by mouth 5 (five) times daily as needed for moderate pain or severe pain.  . methocarbamol (ROBAXIN) 500 MG tablet 90 tablet 0    Sig: Take 1 tablet (500 mg total) by mouth every 8 (eight) hours as needed for muscle spasms.  Marland Kitchen gabapentin (NEURONTIN) 100 MG capsule 90 capsule 0    Sig: Take 1-3 capsules (100-300 mg total) by mouth at bedtime.    ROS  Cardiovascular History: Negative for hypertension, coronary artery diseas, myocardial infraction, anticoagulant therapy or heart failure Pulmonary or Respiratory History: Smoker and Snoring  Neurological History: Incontinence:  Urinary Psychological-Psychiatric History: Negative for anxiety, depression, schizophrenia, bipolar disorders or suicidal ideations or attempts Gastrointestinal History: Irritable Bowel Syndrome (IBS) and Constipation Genitourinary History: Negative for nephrolithiasis, hematuria, renal failure or chronic kidney disease Hematological History: Negative for anticoagulant therapy, anemia, bruising or bleeding easily, hemophilia, sickle cell disease or trait, thrombocytopenia or coagulupathies Endocrine History: Non-insulin-dependent diabetes mellitus Rheumatologic History: Negative for lupus, osteoarthritis, rheumatoid arthritis, myositis, polymyositis or fibromyagia Musculoskeletal History: Negative for myasthenia gravis, muscular dystrophy, multiple sclerosis or malignant hyperthermia Work History: Out on work excuse  [place]  Medical:  Kelly Rollins  has a past medical history of Headache(784.0); Anal fissure; Hypertension; IBS (irritable bowel syndrome); ABDOMINAL PAIN-LUQ (04/17/2009); ABDOMINAL PAIN OTHER SPECIFIED SITE (02/21/2009); Migraine headache (11/06/2008); HEMATOCHEZIA (04/17/2009); PALPITATIONS, OCCASIONAL (04/17/2010); and OVARIAN CYST, RIGHT (02/21/2009). Family: family history includes Cancer in her mother; Heart disease in her father; Hyperlipidemia in  her father and mother; Hypertension in her father and mother. Surgical:  has past surgical history that includes Cesarean section; Vaginal hysterectomy; Cholecystectomy; and Breast reduction surgery. Tobacco:  reports that she has been smoking.  She does not have any smokeless tobacco history on file. Alcohol:  reports that she drinks alcohol. Drug:  has no drug history on file.  Physical Exam  Vitals:  Today's Vitals   05/01/15 0912 05/01/15 0915  BP: 143/81   Pulse: 95   Temp: 98.4 F (36.9 C)   TempSrc: Oral   Resp: 16   Height: 5\' 4"  (1.626 m)   Weight: 188 lb (85.276 kg)   SpO2: 99%   PainSc:  4     Calculated BMI: Body mass index is 32.25 kg/(m^2). Obese (Class I) (30-34.9 kg/m2) - 68% higher incidence of chronic pain  General appearance: alert, cooperative, appears stated age and no distress Eyes: PERLA Respiratory: No evidence respiratory distress, no audible rales or ronchi and no use of accessory muscles of respiration  Lumbar Spine Inspection: No gross anomalies detected Alignment: Symetrical ROM: Decreased  Gait: WNL  Lower Extremities Inspection: No gross anomalies detected ROM: Adequate Sensory: Normal Motor: Unremarkable  Assessment  Primary Diagnosis & Pertinent Problem List: The primary encounter diagnosis was Chronic pain. Diagnoses of Long term current use of opiate analgesic, Long term prescription opiate use, Opiate use, Encounter for therapeutic drug level monitoring, Encounter for pain management planning, Chronic low back pain (Location of Primary Source of Pain) (Bilateral) (L>R), Lumbar facet syndrome (Location of Primary Source of Pain) (Bilateral) (L>R), Chronic hip pain, unspecified laterality, Chronic knee pain (Location of Tertiary source of pain) (Bilateral) (L>R), Lumbar spondylosis, unspecified spinal osteoarthritis, Neuropathic pain, Neurogenic pain, Myofascial pain, Constipation, unspecified constipation type, Gastroesophageal reflux  disease without esophagitis, and HYPERCHOLESTEROLEMIA were also pertinent to this visit.  Visit Diagnosis: 1. Chronic pain   2. Long term current use of opiate analgesic   3. Long term prescription opiate use   4. Opiate use   5. Encounter for therapeutic drug level monitoring   6. Encounter for pain management planning   7. Chronic low back pain (Location of Primary Source of Pain) (Bilateral) (L>R)   8. Lumbar facet syndrome (Location of Primary Source of Pain) (Bilateral) (L>R)   9. Chronic hip pain, unspecified laterality   10. Chronic knee pain (Location of Tertiary source of pain) (Bilateral) (L>R)   11. Lumbar spondylosis, unspecified spinal osteoarthritis   12. Neuropathic pain   13. Neurogenic pain   14. Myofascial pain   15. Constipation, unspecified constipation type   16. Gastroesophageal reflux disease without esophagitis   17. HYPERCHOLESTEROLEMIA     Assessment: No problem-specific assessment & plan notes found for this encounter.   Plan of Care  Note: As per protocol, today's visit has been an evaluation only. We have not taken over the patient's controlled substance management.  Pharmacotherapy (Medications Ordered): Meds ordered this encounter  Medications  . HYDROcodone-acetaminophen (NORCO/VICODIN) 5-325 MG tablet    Sig: Take 1 tablet by mouth 5 (five) times daily as needed for moderate pain or severe pain.    Dispense:  150 tablet    Refill:  0    Do not add this medication to the electronic "Automatic Refill" notification system. Patient may have prescription filled one day early if pharmacy is closed on scheduled refill date. Do not fill until: 05/01/15 To last until: 05/31/15  . methocarbamol (ROBAXIN) 500 MG tablet    Sig: Take 1 tablet (500 mg total) by mouth every 8 (eight) hours as needed for muscle spasms.    Dispense:  90 tablet    Refill:  0    Do not place this medication, or any other prescription from our practice, on "Automatic Refill".  Patient may have prescription filled one day early if pharmacy is closed on scheduled refill date.  Marland Kitchen  gabapentin (NEURONTIN) 100 MG capsule    Sig: Take 1-3 capsules (100-300 mg total) by mouth at bedtime.    Dispense:  90 capsule    Refill:  0    Do not place this medication, or any other prescription from our practice, on "Automatic Refill". Patient may have prescription filled one day early if pharmacy is closed on scheduled refill date.    Lab-work & Procedure Ordered: Orders Placed This Encounter  Procedures  . LUMBAR FACET(MEDIAL BRANCH NERVE BLOCK) MBNB    Standing Status: Future     Number of Occurrences:      Standing Expiration Date: 04/30/2016    Scheduling Instructions:     Side: Bilateral     Level: L2, L3, L4, L5, & S1 Medial Branch Nerve     Sedation: With Sedation.     Timeframe: ASAA    Order Specific Question:  Where will this procedure be performed?    Answer:  [facility]  . DG Knee 1-2 Views Left    Standing Status: Future     Number of Occurrences:      Standing Expiration Date: 04/30/2016    Order Specific Question:  Reason for Exam (SYMPTOM  OR DIAGNOSIS REQUIRED)    Answer:  Left knee pain/arthralgia    Order Specific Question:  Is the patient pregnant?    Answer:  No    Order Specific Question:  Preferred imaging location?    Answer:  [facility]    Order Specific Question:  Call Results- Best Contact Number?    Answer:  [id number] (Pain Clinic facility) (Dr. Dossie Arbour)  . DG Knee 1-2 Views Right    Standing Status: Future     Number of Occurrences:      Standing Expiration Date: 04/30/2016    Order Specific Question:  Reason for Exam (SYMPTOM  OR DIAGNOSIS REQUIRED)    Answer:  Left knee pain/arthralgia    Order Specific Question:  Is the patient pregnant?    Answer:  No    Order Specific Question:  Preferred imaging location?    Answer:  [facility]    Order Specific Question:  Call Results- Best Contact Number?    Answer:   [id number] (Pain Clinic facility) (Dr. Dossie Arbour)  . ToxASSURE Select 13 (MW), Urine    Volume: 30 ml(s). Minimum 3 ml of urine is needed. Document temperature of fresh sample. Indications: Long term (current) use of opiate analgesic (Z79.891)  . Comprehensive metabolic panel    Standing Status: Future     Number of Occurrences:      Standing Expiration Date: 05/31/2015    Order Specific Question:  Has the patient fasted?    Answer:  No  . C-reactive protein    Standing Status: Future     Number of Occurrences:      Standing Expiration Date: 05/31/2015  . Magnesium    Standing Status: Future     Number of Occurrences:      Standing Expiration Date: 05/31/2015  . Sedimentation rate    Standing Status: Future     Number of Occurrences:      Standing Expiration Date: 05/31/2015  . Vitamin B12    Standing Status: Future     Number of Occurrences:      Standing Expiration Date: 05/31/2015  . Vitamin D 1,25 dihydroxy    Standing Status: Future     Number of Occurrences:      Standing Expiration Date: 05/31/2015  Imaging Ordered: DG KNEE 1-2 VIEWS LEFT DG KNEE 1-2 VIEWS RIGHT  Interventional Therapies: Scheduled: None at this time. PRN Procedures: None at this time.    Referral(s) or Consult(s): None at this time.  Medications administered during this visit: Kelly Rollins had no medications administered during this visit.  Prescriptions ordered during this visit: New Prescriptions   GABAPENTIN (NEURONTIN) 100 MG CAPSULE    Take 1-3 capsules (100-300 mg total) by mouth at bedtime.   METHOCARBAMOL (ROBAXIN) 500 MG TABLET    Take 1 tablet (500 mg total) by mouth every 8 (eight) hours as needed for muscle spasms.    Future Appointments Date Time Provider [place]  05/28/2015 1:40 PM Milinda Pointer, MD [facility] None    Primary Care Physician: Leonel Ramsay, MD Location: [facility] Note by: Kathlen Brunswick Dossie Arbour, M.D, DABA,  DABAPM, DABPM, DABIPP, FIPP

## 2015-05-05 LAB — TOXASSURE SELECT 13 (MW), URINE: PDF: 0

## 2015-05-28 ENCOUNTER — Ambulatory Visit: Payer: BLUE CROSS/BLUE SHIELD | Attending: Pain Medicine | Admitting: Pain Medicine

## 2015-05-28 VITALS — BP 159/94 | HR 120 | Temp 98.1°F | Resp 16 | Ht 64.0 in | Wt 188.0 lb

## 2015-05-28 DIAGNOSIS — K589 Irritable bowel syndrome without diarrhea: Secondary | ICD-10-CM | POA: Diagnosis not present

## 2015-05-28 DIAGNOSIS — Z5181 Encounter for therapeutic drug level monitoring: Secondary | ICD-10-CM | POA: Diagnosis not present

## 2015-05-28 DIAGNOSIS — M791 Myalgia: Secondary | ICD-10-CM | POA: Diagnosis not present

## 2015-05-28 DIAGNOSIS — Z9049 Acquired absence of other specified parts of digestive tract: Secondary | ICD-10-CM | POA: Insufficient documentation

## 2015-05-28 DIAGNOSIS — Z90710 Acquired absence of both cervix and uterus: Secondary | ICD-10-CM | POA: Diagnosis not present

## 2015-05-28 DIAGNOSIS — M545 Low back pain, unspecified: Secondary | ICD-10-CM

## 2015-05-28 DIAGNOSIS — K5900 Constipation, unspecified: Secondary | ICD-10-CM | POA: Insufficient documentation

## 2015-05-28 DIAGNOSIS — Z79891 Long term (current) use of opiate analgesic: Secondary | ICD-10-CM

## 2015-05-28 DIAGNOSIS — K219 Gastro-esophageal reflux disease without esophagitis: Secondary | ICD-10-CM | POA: Insufficient documentation

## 2015-05-28 DIAGNOSIS — E7800 Pure hypercholesterolemia, unspecified: Secondary | ICD-10-CM | POA: Insufficient documentation

## 2015-05-28 DIAGNOSIS — M7918 Myalgia, other site: Secondary | ICD-10-CM

## 2015-05-28 DIAGNOSIS — M25559 Pain in unspecified hip: Secondary | ICD-10-CM | POA: Diagnosis present

## 2015-05-28 DIAGNOSIS — M792 Neuralgia and neuritis, unspecified: Secondary | ICD-10-CM

## 2015-05-28 DIAGNOSIS — F1190 Opioid use, unspecified, uncomplicated: Secondary | ICD-10-CM

## 2015-05-28 DIAGNOSIS — M549 Dorsalgia, unspecified: Secondary | ICD-10-CM | POA: Diagnosis present

## 2015-05-28 DIAGNOSIS — E781 Pure hyperglyceridemia: Secondary | ICD-10-CM | POA: Diagnosis not present

## 2015-05-28 DIAGNOSIS — I10 Essential (primary) hypertension: Secondary | ICD-10-CM | POA: Diagnosis not present

## 2015-05-28 DIAGNOSIS — R51 Headache: Secondary | ICD-10-CM | POA: Insufficient documentation

## 2015-05-28 DIAGNOSIS — S32009S Unspecified fracture of unspecified lumbar vertebra, sequela: Secondary | ICD-10-CM

## 2015-05-28 DIAGNOSIS — E785 Hyperlipidemia, unspecified: Secondary | ICD-10-CM | POA: Insufficient documentation

## 2015-05-28 DIAGNOSIS — K760 Fatty (change of) liver, not elsewhere classified: Secondary | ICD-10-CM | POA: Insufficient documentation

## 2015-05-28 DIAGNOSIS — E669 Obesity, unspecified: Secondary | ICD-10-CM | POA: Insufficient documentation

## 2015-05-28 DIAGNOSIS — G8929 Other chronic pain: Secondary | ICD-10-CM

## 2015-05-28 DIAGNOSIS — F17210 Nicotine dependence, cigarettes, uncomplicated: Secondary | ICD-10-CM | POA: Insufficient documentation

## 2015-05-28 DIAGNOSIS — M47816 Spondylosis without myelopathy or radiculopathy, lumbar region: Secondary | ICD-10-CM

## 2015-05-28 DIAGNOSIS — F419 Anxiety disorder, unspecified: Secondary | ICD-10-CM | POA: Insufficient documentation

## 2015-05-28 DIAGNOSIS — S32008S Other fracture of unspecified lumbar vertebra, sequela: Secondary | ICD-10-CM

## 2015-05-28 MED ORDER — CYCLOBENZAPRINE HCL 5 MG PO TABS: 5.0000 mg | ORAL_TABLET | Freq: Three times a day (TID) | ORAL | Status: DC | PRN

## 2015-05-28 MED ORDER — OXYCODONE HCL 5 MG PO TABS: 5.0000 mg | ORAL_TABLET | Freq: Every day | ORAL | Status: DC | PRN

## 2015-05-28 MED ORDER — MELOXICAM 15 MG PO TABS: 15.0000 mg | ORAL_TABLET | Freq: Every day | ORAL | Status: DC

## 2015-05-28 MED ORDER — GABAPENTIN 100 MG PO CAPS: 100.0000 mg | ORAL_CAPSULE | Freq: Every day | ORAL | Status: DC

## 2015-05-28 MED ORDER — METHYLPREDNISOLONE 4 MG PO TBPK: ORAL_TABLET | ORAL | Status: DC

## 2015-05-28 NOTE — Progress Notes (Signed)
Patient's Name: Kelly Rollins  Patient type: Established  MRN: [id number]  Service setting: Ambulatory outpatient  DOB: November 04, 1970  Location: [facility]  DOS: 05/28/2015  Primary Care Physician: FITZGERALD, DAVID Mamie Nick, MD  Note by: Kathlen Brunswick Dossie Arbour, M.D, DABA, DABAPM, DABPM, DABIPP, FIPP  Referring Physician: Leonel Ramsay, MD  Specialty: Board-Certified Interventional Pain Management     Primary Reason(s) for Visit: Encounter for prescription drug management (Level of risk: moderate) CC: Back Pain and Hip Pain   HPI  Kelly Rollins is a 45 y.o. year old, female patient, who returns today as an established patient. She has TOBACCO ABUSE; GERD; Constipation; Pure hypercholesterolemia; Hyperlipidemia; Fatty liver disease; Anxiety; Obesity; Essential hypertriglyceridemia; Chronic pain; Long term current use of opiate analgesic; Long term prescription opiate use; Opiate use; Encounter for therapeutic drug level monitoring; Encounter for pain management planning; Chronic low back pain (Location of Primary Source of Pain) (Bilateral) (L>R); Lumbar facet syndrome (Location of Primary Source of Pain) (Bilateral) (L>R); Chronic hip pain (Location of Secondary source of pain) (Bilateral) (L>R); Chronic knee pain (Location of Tertiary source of pain) (Bilateral) (L>R); Lumbar spondylosis; Neuropathic pain; Neurogenic pain; Myofascial pain; Acute low back pain; and Lumbar transverse process fracture (HCC) (Left L1) (03/11/15) on her problem list.. Her primarily concern today is the Back Pain and Hip Pain   Pain Assessment: Self-Reported Pain Score: 3  Reported level is compatible with observation Pain Type: Chronic pain Pain Location: Back Pain Orientation: Lower Pain Descriptors / Indicators: Aching, Constant, Radiating ("pain where I fell is like a knife twisting, I have to sit down") Pain Frequency: Constant  The patient comes into the clinics today for  pharmacological management of her chronic pain.On 03/11/2015 the patient suffered a fall and subsequently was found to have a fracture of the left L1 transverse process. Since then, her pain has been out of control. Previously we had done some lumbar facet blocks with excellent results, but all of this has gotten worse since the fracture. The patient indicates that the Norco has not been helping and she has had to take 6-7 per day. She also indicated being unable to take the Robaxin during the day because it makes her sleepy and when she does take it at night she gets the headache. Since she does not seem to get headaches with the Flexeril we will put her back on Flexeril at 5 mg with trial that see if that helps her at night. I have also recommended that she try it during the day, during the weekend to see if it makes her sleepy. She has been increasing the Neurontin and she is up to 200 mg at bedtime. We will continue with this. To try to help her with the acute on chronic pain I will give her a Medrol Dosepak and if this doesn't work, we will have her come back for a diagnostic bilateral lumbar facet block under fluoroscopic guidance, but at that time we will also inject the fracture at the transverse process to see if we can decrease some of the pain in that area. She indicates that because of the pain she has not been as active and she has gained 20 pounds. This has been very counterproductive and she knows this.       Controlled Substance Pharmacotherapy Assessment & REMS (Risk Evaluation and Mitigation Strategy)  Analgesic: Hydrocodone/APAP 5/325 one tablet 5 times a day (25 mg/day) Pill Count: Pill count is 13/150 and patient states " I  have a drawer at work that has more pills in it at least approx 15-20 more tablets at work" hydrocodone/acetaminophen 5/325mg  ,filled on 05/10/2015 MME/day: 25 mg/day   Pharmacokinetics: Onset of action (Liberation/Absorption): Within expected pharmacological  parameters Time to Peak effect (Distribution): Timing and results are as within normal expected parameters Duration of action (Metabolism/Excretion): Within normal limits for medication Pharmacodynamics: Analgesic Effect: Less than 50% Activity Facilitation: Medication(s) allow patient to sit, stand, walk, and do the basic ADLs Perceived Effectiveness: Described as ineffective and would like to make some changes Side-effects or Adverse reactions: None reported Monitoring: [place] PMP: Online review of the past 53-month period conducted. Compliant with practice rules and regulations UDS Results/interpretation: The patient's last UDS was done on 05/01/2015 and it came back within normal limits with no unexpected findings. Medication Assessment Form: Reviewed. Patient indicates being compliant with therapy Treatment compliance: Compliant Risk Assessment: Aberrant Behavior: None observed today Substance Use Disorder (SUD) Risk Level: No change since last visit Risk of opioid abuse or dependence: 0.7-3.0% with doses ? 36 MME/day and 6.1-26% with doses ? 120 MME/day. Opioid Risk Tool (ORT) Score:  1 Low Risk for SUD (Score <3) Depression Scale Score: PHQ-2: PHQ-2 Total Score: 0 No depression (0) PHQ-9: PHQ-9 Total Score: 0 No depression (0-4)  Pharmacologic Plan: Today we will discontinue the hydrocodone and start the patient on oxycodone 5 mg. They UDS to eliminate the acetaminophen and to also increase his strength of the medication to help her with her current acute on chronic pain.  Laboratory Chemistry  Inflammation Markers Lab Results  Component Value Date   ESRSEDRATE 23* 04/27/2013    Renal Function Lab Results  Component Value Date   BUN 12 03/18/2013   CREATININE 0.96 03/18/2013   GFRAA >60 03/18/2013   GFRNONAA >60 03/18/2013    Hepatic Function Lab Results  Component Value Date   AST 28 03/18/2013   ALT 37 03/18/2013   ALBUMIN 3.9 03/18/2013    Electrolytes Lab  Results  Component Value Date   NA 137 03/18/2013   K 3.9 03/18/2013   CL 106 03/18/2013   CALCIUM 9.4 03/18/2013   MG 1.8 04/27/2013    Pain Modulating Vitamins No results found for: [place], [id number], [id number], [id number], VITAMINB12  Coagulation Parameters No results found for: INR, LABPROT  Note: I personally reviewed the above data. Results shared with patient.  Meds  The patient has a current medication list which includes the following prescription(s): vitamin c, b complex vitamins, gabapentin, multivitamin, cyclobenzaprine, meloxicam, methylprednisolone, and oxycodone.  Current Outpatient Prescriptions on File Prior to Visit  Medication Sig  . Ascorbic Acid (VITAMIN C) 100 MG tablet Take 100 mg by mouth daily.  Marland Kitchen b complex vitamins tablet Take 1 tablet by mouth daily.  . Multiple Vitamin (MULTIVITAMIN) tablet Take 1 tablet by mouth daily.   No current facility-administered medications on file prior to visit.    ROS  Constitutional: Afebrile, no chills, well hydrated and well nourished Gastrointestinal: No upper or lower GI bleeding, no nausea, no vomiting and no acute GI distress Musculoskeletal: No acute joint swelling or redness, no acute loss of range of motion and no acute onset weakness Neurological: Denies any acute onset apraxia, no episodes of paralysis, no acute loss of coordination, no acute loss of consciousness and no acute onset aphasia, dysarthria, agnosia, or amnesia  Allergies  Ms. Zuehlke is allergic to morphine; penicillins; and tramadol hcl.  [place]  Medical:  Ms. Logemann  has a past medical  history of Headache(784.0); Anal fissure; Hypertension; IBS (irritable bowel syndrome); ABDOMINAL PAIN-LUQ (04/17/2009); ABDOMINAL PAIN OTHER SPECIFIED SITE (02/21/2009); Migraine headache (11/06/2008); HEMATOCHEZIA (04/17/2009); PALPITATIONS, OCCASIONAL (04/17/2010); and OVARIAN CYST, RIGHT (02/21/2009). Family: family history includes Cancer in her mother; Heart  disease in her father; Hyperlipidemia in her father and mother; Hypertension in her father and mother. Surgical:  has past surgical history that includes Cesarean section; Vaginal hysterectomy; Cholecystectomy; and Breast reduction surgery. Tobacco:  reports that she has been smoking.  She does not have any smokeless tobacco history on file. Alcohol:  reports that she drinks alcohol. Drug:  has no drug history on file.  Physical Examination  Constitutional Vitals: Blood pressure 159/94, pulse 120, temperature 98.1 F (36.7 C), temperature source Oral, resp. rate 16, height 5\' 4"  (1.626 m), weight 188 lb (85.276 kg), SpO2 99 %. Calculated BMI: Body mass index is 32.25 kg/(m^2). (30-34.9 kg/m2) Obese (Class I) - 68% higher incidence of chronic pain General appearance: Alert, cooperative, oriented x 3, in no acute distress, well nourished, well developed, well hydrated Eyes: PERLA Respiratory: No evidence respiratory distress, no audible rales or ronchi and no use of accessory muscles of respiration Psych: Alert, oriented to person, oriented to place and oriented to time  Cervical Spine Exam  Inspection: Normal anatomy, no anomalies observed Cervical Lordosis: Normal Alignment: Symetrical Functional ROM: Within functional limits (WFL) AROM: WFL Sensory: No sensory anomalies reported or detected  Upper Extremity Exam    Right  Left  Inspection: No gross anomalies detected  Inspection: No gross anomalies detected  Functional ROM: Adequate  Functional ROM: Adequate  AROM: Adequate  AROM: Adequate  Sensory: No sensory anomalies reported or detected  Sensory: No sensory anomalies reported or detected  Motor: Unremarkable  Motor: Unremarkable  Vascular: Normal skin color, temperature, and hair growth. No peripheral edema or cyanosis  Vascular: Normal skin color, temperature, and hair growth. No peripheral edema or cyanosis   Thoracic Spine  Inspection: No gross anomalies detected Alignment:  Symetrical Functional ROM: Within functional limits [facility]) AROM: Adequate Palpation: WNL  Lumbar Spine  Inspection: No gross anomalies detected Alignment: Symetrical Functional ROM: Within functional limits [facility]) AROM: Decreased Sensory: No sensory anomalies reported or detected Palpation: Tender Provocative Tests: Lumbar Hyperextension and rotation test: Positive bilaterally for facet joint pain. Patrick's Maneuver: deferred  Gait Assessment  Gait: WNL  Lower Extremities    Right  Left  Inspection: No gross anomalies detected  Inspection: No gross anomalies detected  Functional ROM: Within functional limits [facility])  Functional ROM: Within functional limits (WFL)  AROM: Adequate  AROM: Adequate  Sensory: No sensory anomalies reported or detected  Sensory: No sensory anomalies reported or detected  Motor: Unremarkable  Motor: Unremarkable  Toe walk (S1): WNL  Toe walk (S1): WNL  Heal walk (L5): WNL  Heal walk (L5): WNL   Assessment & Plan  Primary Diagnosis & Pertinent Problem List: The primary encounter diagnosis was Chronic low back pain (Location of Primary Source of Pain) (Bilateral) (L>R). Diagnoses of Neurogenic pain, Chronic pain, Encounter for therapeutic drug level monitoring, Long term current use of opiate analgesic, Lumbar facet syndrome (Location of Primary Source of Pain) (Bilateral) (L>R), Lumbar spondylosis, unspecified spinal osteoarthritis, Myofascial pain, Acute low back pain, and Lumbar transverse process fracture, sequela were also pertinent to this visit.  Visit Diagnosis: 1. Chronic low back pain (Location of Primary Source of Pain) (Bilateral) (L>R)   2. Neurogenic pain   3. Chronic pain   4. Encounter for therapeutic  drug level monitoring   5. Long term current use of opiate analgesic   6. Lumbar facet syndrome (Location of Primary Source of Pain) (Bilateral) (L>R)   7. Lumbar spondylosis, unspecified spinal osteoarthritis   8. Myofascial pain   9. Acute  low back pain   10. Lumbar transverse process fracture, sequela     Problems updated and reviewed during this visit: Problem  Acute Low Back Pain  Lumbar transverse process fracture (HCC) (Left L1) (03/11/15)    Problem-specific Plan(s): No problem-specific assessment & plan notes found for this encounter.  No new assessment & plan notes have been filed under this hospital service since the last note was generated. Service: Pain Management   Plan of Care   Problem List Items Addressed This Visit      High   Acute low back pain   Relevant Medications   oxyCODONE (OXY IR/ROXICODONE) 5 MG immediate release tablet   cyclobenzaprine (FLEXERIL) 5 MG tablet   meloxicam (MOBIC) 15 MG tablet   methylPREDNISolone (MEDROL) 4 MG TBPK tablet   Chronic low back pain (Location of Primary Source of Pain) (Bilateral) (L>R) - Primary (Chronic)   Relevant Medications   oxyCODONE (OXY IR/ROXICODONE) 5 MG immediate release tablet   cyclobenzaprine (FLEXERIL) 5 MG tablet   meloxicam (MOBIC) 15 MG tablet   methylPREDNISolone (MEDROL) 4 MG TBPK tablet   Chronic pain (Chronic)   Relevant Medications   gabapentin (NEURONTIN) 100 MG capsule   oxyCODONE (OXY IR/ROXICODONE) 5 MG immediate release tablet   cyclobenzaprine (FLEXERIL) 5 MG tablet   meloxicam (MOBIC) 15 MG tablet   methylPREDNISolone (MEDROL) 4 MG TBPK tablet   Lumbar facet syndrome (Location of Primary Source of Pain) (Bilateral) (L>R) (Chronic)   Relevant Medications   oxyCODONE (OXY IR/ROXICODONE) 5 MG immediate release tablet   cyclobenzaprine (FLEXERIL) 5 MG tablet   meloxicam (MOBIC) 15 MG tablet   methylPREDNISolone (MEDROL) 4 MG TBPK tablet   Other Relevant Orders   LUMBAR FACET(MEDIAL BRANCH NERVE BLOCK) MBNB   Lumbar spondylosis (Chronic)   Relevant Medications   oxyCODONE (OXY IR/ROXICODONE) 5 MG immediate release tablet   cyclobenzaprine (FLEXERIL) 5 MG tablet   meloxicam (MOBIC) 15 MG tablet   methylPREDNISolone  (MEDROL) 4 MG TBPK tablet   Lumbar transverse process fracture (HCC) (Left L1) (03/11/15) (Chronic)   Relevant Orders   TRIGGER POINT INJECTION   Myofascial pain (Chronic)   Relevant Medications   cyclobenzaprine (FLEXERIL) 5 MG tablet   Neurogenic pain (Chronic)   Relevant Medications   gabapentin (NEURONTIN) 100 MG capsule     Medium   Encounter for therapeutic drug level monitoring   Long term current use of opiate analgesic (Chronic)   Relevant Orders   ToxASSURE Select 13 (MW), Urine       Pharmacotherapy (Medications Ordered): Meds ordered this encounter  Medications  . gabapentin (NEURONTIN) 100 MG capsule    Sig: Take 1-3 capsules (100-300 mg total) by mouth at bedtime.    Dispense:  90 capsule    Refill:  0    Do not place this medication, or any other prescription from our practice, on "Automatic Refill". Patient may have prescription filled one day early if pharmacy is closed on scheduled refill date.  Marland Kitchen oxyCODONE (OXY IR/ROXICODONE) 5 MG immediate release tablet    Sig: Take 1 tablet (5 mg total) by mouth 5 (five) times daily as needed for severe pain.    Dispense:  150 tablet    Refill:  0  Do not place this medication, or any other prescription from our practice, on "Automatic Refill". Patient may have prescription filled one day early if pharmacy is closed on scheduled refill date. Do not fill until: 05/28/15 To last until: 06/27/15  . cyclobenzaprine (FLEXERIL) 5 MG tablet    Sig: Take 1 tablet (5 mg total) by mouth 3 (three) times daily as needed for muscle spasms.    Dispense:  90 tablet    Refill:  0    Do not place this medication, or any other prescription from our practice, on "Automatic Refill". Patient may have prescription filled one day early if pharmacy is closed on scheduled refill date.  . meloxicam (MOBIC) 15 MG tablet    Sig: Take 1 tablet (15 mg total) by mouth daily.    Dispense:  30 tablet    Refill:  0    Do not place this medication, or  any other prescription from our practice, on "Automatic Refill". Patient may have prescription filled one day early if pharmacy is closed on scheduled refill date.  . methylPREDNISolone (MEDROL) 4 MG TBPK tablet    Sig: Follow package instructions.    Dispense:  21 tablet    Refill:  0    Do not place this medication, or any other prescription from our practice, on "Automatic Refill". Patient may have prescription filled one day early if pharmacy is closed on scheduled refill date.    Lab-work & Procedure Ordered: Orders Placed This Encounter  Procedures  . LUMBAR FACET(MEDIAL BRANCH NERVE BLOCK) MBNB  . TRIGGER POINT INJECTION  . ToxASSURE Select 13 (MW), Urine    Imaging Ordered: None  Interventional Therapies: Scheduled:  None at this point.    Considering:  Bilateral lumbar facet radiofrequency ablation.    PRN Procedures:  Bilateral lumbar facet block under fluoroscopic guidance and IV sedation plus left L1 transverse process injection    Referral(s) or Consult(s): None at this time.  New Prescriptions   CYCLOBENZAPRINE (FLEXERIL) 5 MG TABLET    Take 1 tablet (5 mg total) by mouth 3 (three) times daily as needed for muscle spasms.   MELOXICAM (MOBIC) 15 MG TABLET    Take 1 tablet (15 mg total) by mouth daily.   METHYLPREDNISOLONE (MEDROL) 4 MG TBPK TABLET    Follow package instructions.   OXYCODONE (OXY IR/ROXICODONE) 5 MG IMMEDIATE RELEASE TABLET    Take 1 tablet (5 mg total) by mouth 5 (five) times daily as needed for severe pain.    Medications administered during this visit: Ms. Escamilla had no medications administered during this visit.  Future Appointments Date Time Provider [place]  06/24/2015 1:40 PM Milinda Pointer, MD [facility] None    Primary Care Physician: Leonel Ramsay, MD Location: [facility] Note by: Kathlen Brunswick Dossie Arbour, M.D, DABA, DABAPM, DABPM, DABIPP, FIPP  Pain Score Disclaimer: We use the NRS-11  scale. This is a self-reported, subjective measurement of pain severity with only modest accuracy. It is used primarily to identify changes within a particular patient. It must be understood that outpatient pain scales are significantly less accurate that those used for research, where they can be applied under ideal controlled circumstances with minimal exposure to variables. In reality, the score is likely to be a combination of pain intensity and pain affect, where pain affect describes the degree of emotional arousal or changes in action readiness caused by the sensory experience of pain. Factors such as social and work situation, setting, emotional  state, anxiety levels, expectation, and prior pain experience may influence pain perception and show large inter-individual differences that may also be affected by time variables.

## 2015-05-28 NOTE — Progress Notes (Signed)
Safety precautions to be maintained throughout the outpatient stay will include: orient to surroundings, keep bed in low position, maintain call bell within reach at all times, provide assistance with transfer out of bed and ambulation. Pill count is 13/150 and patient states " I have a drawer at work that has more pills in it at least approx 15-20 more tablets at work" hydrocodone/acetaminophen  5/325mg  ,filled on 05/10/2015

## 2015-05-28 NOTE — Patient Instructions (Signed)

## 2015-06-04 LAB — PLEASE NOTE

## 2015-06-04 LAB — TOXASSURE SELECT 13 (MW), URINE: PDF: 0

## 2015-06-20 ENCOUNTER — Encounter: Payer: Self-pay | Admitting: Pain Medicine

## 2015-06-20 ENCOUNTER — Ambulatory Visit: Payer: BLUE CROSS/BLUE SHIELD | Attending: Pain Medicine | Admitting: Pain Medicine

## 2015-06-20 VITALS — BP 168/76 | HR 91 | Temp 98.3°F | Resp 16 | Ht 64.0 in | Wt 190.0 lb

## 2015-06-20 DIAGNOSIS — E785 Hyperlipidemia, unspecified: Secondary | ICD-10-CM | POA: Diagnosis not present

## 2015-06-20 DIAGNOSIS — M25561 Pain in right knee: Secondary | ICD-10-CM | POA: Diagnosis not present

## 2015-06-20 DIAGNOSIS — M25562 Pain in left knee: Secondary | ICD-10-CM | POA: Insufficient documentation

## 2015-06-20 DIAGNOSIS — M791 Myalgia: Secondary | ICD-10-CM | POA: Insufficient documentation

## 2015-06-20 DIAGNOSIS — K760 Fatty (change of) liver, not elsewhere classified: Secondary | ICD-10-CM | POA: Insufficient documentation

## 2015-06-20 DIAGNOSIS — M25552 Pain in left hip: Secondary | ICD-10-CM | POA: Diagnosis not present

## 2015-06-20 DIAGNOSIS — E669 Obesity, unspecified: Secondary | ICD-10-CM | POA: Insufficient documentation

## 2015-06-20 DIAGNOSIS — Z79891 Long term (current) use of opiate analgesic: Secondary | ICD-10-CM | POA: Diagnosis not present

## 2015-06-20 DIAGNOSIS — F17210 Nicotine dependence, cigarettes, uncomplicated: Secondary | ICD-10-CM | POA: Insufficient documentation

## 2015-06-20 DIAGNOSIS — M25551 Pain in right hip: Secondary | ICD-10-CM | POA: Insufficient documentation

## 2015-06-20 DIAGNOSIS — S32009S Unspecified fracture of unspecified lumbar vertebra, sequela: Secondary | ICD-10-CM

## 2015-06-20 DIAGNOSIS — M7918 Myalgia, other site: Secondary | ICD-10-CM

## 2015-06-20 DIAGNOSIS — E7800 Pure hypercholesterolemia, unspecified: Secondary | ICD-10-CM | POA: Insufficient documentation

## 2015-06-20 DIAGNOSIS — G8929 Other chronic pain: Secondary | ICD-10-CM

## 2015-06-20 DIAGNOSIS — K5900 Constipation, unspecified: Secondary | ICD-10-CM | POA: Insufficient documentation

## 2015-06-20 DIAGNOSIS — M47816 Spondylosis without myelopathy or radiculopathy, lumbar region: Secondary | ICD-10-CM

## 2015-06-20 DIAGNOSIS — F419 Anxiety disorder, unspecified: Secondary | ICD-10-CM | POA: Insufficient documentation

## 2015-06-20 DIAGNOSIS — M549 Dorsalgia, unspecified: Secondary | ICD-10-CM | POA: Diagnosis present

## 2015-06-20 DIAGNOSIS — K219 Gastro-esophageal reflux disease without esophagitis: Secondary | ICD-10-CM | POA: Insufficient documentation

## 2015-06-20 DIAGNOSIS — E781 Pure hyperglyceridemia: Secondary | ICD-10-CM | POA: Insufficient documentation

## 2015-06-20 DIAGNOSIS — S32008S Other fracture of unspecified lumbar vertebra, sequela: Secondary | ICD-10-CM

## 2015-06-20 DIAGNOSIS — M545 Low back pain, unspecified: Secondary | ICD-10-CM

## 2015-06-20 DIAGNOSIS — M792 Neuralgia and neuritis, unspecified: Secondary | ICD-10-CM

## 2015-06-20 MED ORDER — ROPIVACAINE HCL 2 MG/ML IJ SOLN: 9.0000 mL | Freq: Once | INTRAMUSCULAR | Status: DC

## 2015-06-20 MED ORDER — TRIAMCINOLONE ACETONIDE 40 MG/ML IJ SUSP
INTRAMUSCULAR | Status: AC
  Administered 2015-06-20: 10:00:00
  Filled 2015-06-20: qty 2

## 2015-06-20 MED ORDER — FENTANYL CITRATE (PF) 100 MCG/2ML IJ SOLN
25.0000 ug | INTRAMUSCULAR | Status: DC | PRN
Start: 2015-06-20 — End: 2015-09-10

## 2015-06-20 MED ORDER — MIDAZOLAM HCL 5 MG/5ML IJ SOLN
INTRAMUSCULAR | Status: AC
  Administered 2015-06-20: 3 mg via INTRAVENOUS
  Filled 2015-06-20: qty 5

## 2015-06-20 MED ORDER — LACTATED RINGERS IV SOLN: 1000.0000 mL | Freq: Once | INTRAVENOUS | Status: DC

## 2015-06-20 MED ORDER — GABAPENTIN 100 MG PO CAPS: 100.0000 mg | ORAL_CAPSULE | Freq: Every day | ORAL | Status: DC

## 2015-06-20 MED ORDER — CYCLOBENZAPRINE HCL 5 MG PO TABS: 5.0000 mg | ORAL_TABLET | Freq: Three times a day (TID) | ORAL | Status: DC | PRN

## 2015-06-20 MED ORDER — OXYCODONE HCL 5 MG PO TABS: 5.0000 mg | ORAL_TABLET | Freq: Every day | ORAL | Status: DC | PRN

## 2015-06-20 MED ORDER — LIDOCAINE HCL (PF) 1 % IJ SOLN: 10.0000 mL | Freq: Once | INTRAMUSCULAR | Status: DC

## 2015-06-20 MED ORDER — MELOXICAM 15 MG PO TABS: 15.0000 mg | ORAL_TABLET | Freq: Every day | ORAL | Status: DC

## 2015-06-20 MED ORDER — ROPIVACAINE HCL 2 MG/ML IJ SOLN
INTRAMUSCULAR | Status: AC
  Administered 2015-06-20: 10:00:00
  Filled 2015-06-20: qty 20

## 2015-06-20 MED ORDER — FENTANYL CITRATE (PF) 100 MCG/2ML IJ SOLN
INTRAMUSCULAR | Status: AC
  Administered 2015-06-20: 100 ug via INTRAVENOUS
  Filled 2015-06-20: qty 2

## 2015-06-20 MED ORDER — TRIAMCINOLONE ACETONIDE 40 MG/ML IJ SUSP: 40.0000 mg | Freq: Once | INTRAMUSCULAR | Status: DC

## 2015-06-20 MED ORDER — MIDAZOLAM HCL 5 MG/5ML IJ SOLN: 1.0000 mg | INTRAMUSCULAR | Status: DC | PRN

## 2015-06-20 NOTE — Progress Notes (Signed)
Patient's Name: Kelly Rollins  Patient type: Established  MRN: [id number]  Service setting: Ambulatory outpatient  DOB: [birth year]  Location: [facility]  DOS: 06/20/2015  Primary Care Physician: FITZGERALD, DAVID Mamie Nick, MD  Note by: Kathlen Brunswick Dossie Arbour, M.D, DABA, DABAPM, DABPM, Milagros Evener, FIPP  Referring Physician: Leonel Ramsay, MD  Specialty: Board-Certified Interventional Pain Management  Last Visit to Pain Management: 05/28/2015   Primary Reason(s) for Visit: Interventional Pain Management Treatment. CC: Back Pain  Primary Diagnosis: Facet syndrome, lumbar [M54.5]   Procedure:  Anesthesia, Analgesia, Anxiolysis:  Type: Diagnostic Medial Branch Facet Block #2 Region: Lumbar Level: L2, L3, L4, L5, & S1 Medial Branch Level(s) Laterality: Bilateral  Indications: 1. Lumbar facet syndrome (Location of Primary Source of Pain) (Bilateral) (L>R)   2. Chronic low back pain (Location of Primary Source of Pain) (Bilateral) (L>R)   3. Lumbar spondylosis, unspecified spinal osteoarthritis   4. Lumbar transverse process fracture, sequela   5. Neurogenic pain   6. Myofascial pain   7. Chronic pain     Pre-procedure Pain Score: 3/10 Reported level of pain is compatible with clinical observations Post-procedure Pain Score: 2   Type: Moderate (Conscious) Sedation & Local Anesthesia Local Anesthetic: Lidocaine 1% Route: Intravenous (IV) IV Access: Secured Sedation: Meaningful verbal contact was maintained at all times during the procedure  Indication(s): Analgesia & Anxiolysis   Pre-Procedure Assessment:  Kelly Rollins is a 45 y.o. year old, female patient, seen today for interventional treatment. She has TOBACCO ABUSE; GERD; Constipation; Pure hypercholesterolemia; Hyperlipidemia; Fatty liver disease; Anxiety; Obesity; Essential hypertriglyceridemia; Chronic pain; Long term current use of opiate analgesic; Long term prescription opiate use; Opiate use (25 MME/Day);  Encounter for therapeutic drug level monitoring; Encounter for pain management planning; Chronic low back pain (Location of Primary Source of Pain) (Bilateral) (L>R); Lumbar facet syndrome (Location of Primary Source of Pain) (Bilateral) (L>R); Chronic hip pain (Location of Secondary source of pain) (Bilateral) (L>R); Chronic knee pain (Location of Tertiary source of pain) (Bilateral) (L>R); Lumbar spondylosis; Neuropathic pain; Neurogenic pain; Myofascial pain; Acute low back pain; and Lumbar transverse process fracture (HCC) (Left L1) (03/11/15) on her problem list.. Her primarily concern today is the Back Pain   Pain Type: Chronic pain Pain Location: Back Pain Orientation: Lower Pain Descriptors / Indicators: Aching, Constant, Radiating Pain Frequency: Constant  Date of Last Visit: 05/28/15 Service Provided on Last Visit: Med Refill  Verification of the correct person, correct site (including marking of site), and correct procedure were performed and confirmed by the patient.  Consent: Secured. Under the influence of no sedatives a written informed consent was obtained, after having provided information on the risks and possible complications. To fulfill our ethical and legal obligations, as recommended by the American Medical Association's Code of Ethics, we have provided information to the patient about our clinical impression; the nature and purpose of the treatment or procedure; the risks, benefits, and possible complications of the intervention; alternatives; the risk(s) and benefit(s) of the alternative treatment(s) or procedure(s); and the risk(s) and benefit(s) of doing nothing. The patient was provided information about the risks and possible complications associated with the procedure. These include, but are not limited to, failure to achieve desired goals, infection, bleeding, organ or nerve damage, allergic reactions, paralysis, and death. In the case of spinal procedures these may  include, but are not limited to, failure to achieve desired goals, infection, bleeding, organ or nerve damage, allergic reactions, paralysis, and death. In addition, the patient  was informed that Medicine is not an exact science; therefore, there is also the possibility of unforeseen risks and possible complications that may result in a catastrophic outcome. The patient indicated having understood very clearly. We have given the patient no guarantees and we have made no promises. Enough time was given to the patient to ask questions, all of which were answered to the patient's satisfaction.  Consent Attestation: I, the ordering provider, attest that I have discussed with the patient the benefits, risks, side-effects, alternatives, likelihood of achieving goals, and potential problems during recovery for the procedure that I have provided informed consent.  Pre-Procedure Preparation: Safety Precautions: Allergies reviewed. Appropriate site, procedure, and patient were confirmed by following the Joint Commission's Universal Protocol (UP.01.01.01), in the form of a "Time Out". The patient was asked to confirm marked site and procedure, before commencing. The patient was asked about blood thinners, or active infections, both of which were denied. Patient was assessed for positional comfort and all pressure points were checked before starting procedure. Allergies: She is allergic to morphine; penicillins; and tramadol hcl.. Infection Control Precautions: Sterile technique used. Standard Universal Precautions were taken as recommended by the Department of [facility] for Disease Control and Prevention (CDC). Standard pre-surgical skin prep was conducted. Respiratory hygiene and cough etiquette was practiced. Hand hygiene observed. Safe injection practices and needle disposal techniques followed. SDV (single dose vial) medications used. Medications properly checked for expiration dates and contaminants.  Personal protective equipment (PPE) used: Sterile Radiation-resistant gloves. Monitoring:  As per clinic protocol. Filed Vitals:   06/20/15 1035 06/20/15 1045 06/20/15 1055 06/20/15 1105  BP: 141/83 171/88 170/78 168/76  Pulse:      Temp:      Resp: _0 Height:      Weight:      SpO2: 99% 98% 98% 99%  Calculated BMI: Body mass index is 32.6 kg/(m^2).  Description of Procedure Process:   Time-out: "Time-out" completed before starting procedure, as per protocol. Position: Prone Target Area: For Lumbar Facet blocks, the target is the groove formed by the junction of the transverse process and superior articular process. For the L5 dorsal ramus, the target is the notch between superior articular process and sacral ala. For the S1 dorsal ramus, the target is the superior and lateral edge of the posterior S1 Sacral foramen. Approach: Paramedial approach. Area Prepped: Entire Posterior Lumbosacral Region Prepping solution: ChloraPrep (2% chlorhexidine gluconate and 70% isopropyl alcohol) Safety Precautions: Aspiration looking for blood return was conducted prior to all injections. At no point did we inject any substances, as a needle was being advanced. No attempts were made at seeking any paresthesias. Safe injection practices and needle disposal techniques used. Medications properly checked for expiration dates. SDV (single dose vial) medications used.   Description of the Procedure: Protocol guidelines were followed. The patient was placed in position over the fluoroscopy table. The target area was identified and the area prepped in the usual manner. Skin desensitized using vapocoolant spray. Skin & deeper tissues infiltrated with local anesthetic. Appropriate amount of time allowed to pass for local anesthetics to take effect. The procedure needle was introduced through the skin, ipsilateral to the reported pain, and advanced to the target area. Employing the "Medial Branch Technique", the  needles were advanced to the angle made by the superior and medial portion of the transverse process, and the lateral and inferior portion of the superior articulating process of the targeted vertebral bodies. This area is  known as "Burton's Eye" or the "Eye of the [place] Dog". A procedure needle was introduced through the skin, and this time advanced to the angle made by the superior and medial border of the sacral ala, and the lateral border of the S1 vertebral body. This last needle was later repositioned at the superior and lateral border of the posterior S1 foramen. Negative aspiration confirmed. Solution injected in intermittent fashion, asking for systemic symptoms every 0.5cc of injectate. The needles were then removed and the area cleansed, making sure to leave some of the prepping solution back to take advantage of its long term bactericidal properties. EBL: None Materials & Medications Used:  Needle(s) Used: 22g - 3.5" Spinal Needle(s)  Imaging Guidance:   Type of Imaging Technique: Fluoroscopy Guidance (Spinal) Indication(s): Assistance in needle guidance and placement for procedures requiring needle placement in or near specific anatomical locations not easily accessible without such assistance. Exposure Time: Please see nurses notes. Contrast: None required. Fluoroscopic Guidance: I was personally present in the fluoroscopy suite, where the patient was placed in position for the procedure, over the fluoroscopy-compatible table. Fluoroscopy was manipulated, using "Tunnel Vision Technique", to obtain the best possible view of the target area, on the affected side. Parallax error was corrected before commencing the procedure. A "direction-depth-direction" technique was used to introduce the needle under continuous pulsed fluoroscopic guidance. Once the target was reached, antero-posterior, oblique, and lateral fluoroscopic projection views were taken to confirm needle placement in all planes.  Permanently recorded images stored by scanning into EMR. Interpretation: Intraoperative imaging interpretation by performing Physician. Adequate needle placement confirmed. Adequate needle placement confirmed in AP, lateral, & Oblique Views. No contrast injected.  Antibiotic Prophylaxis:  Indication(s): No indications identified. Type:  Antibiotics Given (last 72 hours)    None       Post-operative Assessment:   Complications: No immediate post-treatment complications were observed. Disposition: Return to clinic for follow-up evaluation. The patient tolerated the entire procedure well. A repeat set of vitals were taken after the procedure and the patient was kept under observation following institutional policy, for this procedure. Post-procedural neurological assessment was performed, showing return to baseline, prior to discharge. The patient was discharged home, once institutional criteria were met. The patient was provided with post-procedure discharge instructions, including a section on how to identify potential problems. Should any problems arise concerning this procedure, the patient was given instructions to immediately contact us, at any time, without hesitation. In any case, we plan to contact the patient by telephone for a follow-up status report regarding this interventional procedure. Comments:  No additional relevant information.  Medications administered during this visit: We administered ropivacaine (PF) 2 mg/ml (0.2%), fentaNYL, triamcinolone acetonide, and midazolam.  Prescriptions ordered during this visit: New Prescriptions   No medications on file   Meds ordered this encounter  Medications  . fentaNYL (SUBLIMAZE) injection 25-50 mcg    Make sure Narcan is available in the pyxis when using this medication. In the event of respiratory depression (RR< 8/min): Titrate NARCAN (naloxone) in increments of 0.1 to 0.2 mg IV at 2-3 minute intervals, until desired degree of  reversal.  . lactated ringers infusion 1,000 mL  . midazolam (VERSED) 5 MG/5ML injection 1-2 mg    Make sure Flumazenil is available in the pyxis when using this medication. If oversedation occurs, administer 0.2 mg IV over 15 sec. If after 45 sec no response, administer 0.2 mg again over 1 min; may repeat at 1 min intervals; not to exceed  4 doses (1 mg)  . triamcinolone acetonide (KENALOG-40) injection 40 mg  . lidocaine (PF) (XYLOCAINE) 1 % injection 10 mL  . ropivacaine (PF) 2 mg/ml (0.2%) (NAROPIN) epidural 9 mL  . gabapentin (NEURONTIN) 100 MG capsule    Sig: Take 1-3 capsules (100-300 mg total) by mouth at bedtime.    Dispense:  90 capsule    Refill:  2    Do not place this medication, or any other prescription from our practice, on "Automatic Refill". Patient may have prescription filled one day early if pharmacy is closed on scheduled refill date.  . cyclobenzaprine (FLEXERIL) 5 MG tablet    Sig: Take 1 tablet (5 mg total) by mouth 3 (three) times daily as needed for muscle spasms.    Dispense:  90 tablet    Refill:  2    Do not place this medication, or any other prescription from our practice, on "Automatic Refill". Patient may have prescription filled one day early if pharmacy is closed on scheduled refill date.  . meloxicam (MOBIC) 15 MG tablet    Sig: Take 1 tablet (15 mg total) by mouth daily.    Dispense:  30 tablet    Refill:  2    Do not place this medication, or any other prescription from our practice, on "Automatic Refill". Patient may have prescription filled one day early if pharmacy is closed on scheduled refill date.  Marland Kitchen oxyCODONE (OXY IR/ROXICODONE) 5 MG immediate release tablet    Sig: Take 1 tablet (5 mg total) by mouth 5 (five) times daily as needed for severe pain.    Dispense:  150 tablet    Refill:  0    Do not place this medication, or any other prescription from our practice, on "Automatic Refill". Patient may have prescription filled one day early if  pharmacy is closed on scheduled refill date. Do not fill until: 06/27/15 To last until: 07/27/15  . oxyCODONE (OXY IR/ROXICODONE) 5 MG immediate release tablet    Sig: Take 1 tablet (5 mg total) by mouth 5 (five) times daily as needed for severe pain.    Dispense:  150 tablet    Refill:  0    Do not place this medication, or any other prescription from our practice, on "Automatic Refill". Patient may have prescription filled one day early if pharmacy is closed on scheduled refill date. Do not fill until: 07/27/15 To last until: 08/26/15  . oxyCODONE (OXY IR/ROXICODONE) 5 MG immediate release tablet    Sig: Take 1 tablet (5 mg total) by mouth 5 (five) times daily as needed for severe pain.    Dispense:  150 tablet    Refill:  0    Do not place this medication, or any other prescription from our practice, on "Automatic Refill". Patient may have prescription filled one day early if pharmacy is closed on scheduled refill date. Do not fill until: 08/26/15 To last until: 09/25/15     Requested PM Follow-up: Return for Post-Procedure Eval (2 weeks), Medication Management, (3-Mo).  Future Appointments Date Time Provider [place]  07/09/2015 9:20 AM Milinda Pointer, MD [facility] None  09/09/2015 10:40 AM Milinda Pointer, MD [facility] None    Primary Care Physician: Leonel Ramsay, MD Location: [facility] Note by: Kathlen Brunswick Dossie Arbour, M.D, DABA, DABAPM, DABPM, DABIPP, FIPP   Illustration of the posterior view of the lumbar spine and the posterior neural structures. Laminae of L2 through S1 are labeled. DPRL5, dorsal primary  ramus of L5; DPRS1, dorsal primary ramus of S1; DPR3, dorsal primary ramus of L3; FJ, facet (zygapophyseal) joint L3-L4; I, inferior articular process of L4; LB1, lateral branch of dorsal primary ramus of L1; IAB, inferior articular branches from L3 medial branch (supplies L4-L5 facet joint); IBP, intermediate branch plexus; MB3,  medial branch of dorsal primary ramus of L3; NR3, third lumbar nerve root; S, superior articular process of L5; SAB, superior articular branches from L4 (supplies L4-5 facet joint also); TP3, transverse process of L3.  Disclaimer:  Medicine is not an Chief Strategy Officer. The only guarantee in medicine is that nothing is guaranteed. It is important to note that the decision to proceed with this intervention was based on the information collected from the patient. The Data and conclusions were drawn from the patient's questionnaire, the interview, and the physical examination. Because the information was provided in large part by the patient, it cannot be guaranteed that it has not been purposely or unconsciously manipulated. Every effort has been made to obtain as much relevant data as possible for this evaluation. It is important to note that the conclusions that lead to this procedure are derived in large part from the available data. Always take into account that the treatment will also be dependent on availability of resources and existing treatment guidelines, considered by other Pain Management Practitioners as being common knowledge and practice, at the time of the intervention. For Medico-Legal purposes, it is also important to point out that variation in procedural techniques and pharmacological choices are the acceptable norm. The indications, contraindications, technique, and results of the above procedure should only be interpreted and judged by a Board-Certified Interventional Pain Specialist with extensive familiarity and expertise in the same exact procedure and technique. Attempts at providing opinions without similar or greater experience and expertise than that of the treating physician will be considered as inappropriate and unethical, and shall result in a formal complaint to the state medical board and applicable specialty societies.

## 2015-06-20 NOTE — Patient Instructions (Addendum)
Smoking Cessation, Tips for Success If you are ready to quit smoking, congratulations! You have chosen to help yourself be healthier. Cigarettes bring nicotine, tar, carbon monoxide, and other irritants into your body. Your lungs, heart, and blood vessels will be able to work better without these poisons. There are many different ways to quit smoking. Nicotine gum, nicotine patches, a nicotine inhaler, or nicotine nasal spray can help with physical craving. Hypnosis, support groups, and medicines help break the habit of smoking. WHAT THINGS CAN I DO TO MAKE QUITTING EASIER?  Here are some tips to help you quit for good:  Pick a date when you will quit smoking completely. Tell all of your friends and family about your plan to quit on that date.  Do not try to slowly cut down on the number of cigarettes you are smoking. Pick a quit date and quit smoking completely starting on that day.  Throw away all cigarettes.   Clean and remove all ashtrays from your home, work, and car.  On a card, write down your reasons for quitting. Carry the card with you and read it when you get the urge to smoke.  Cleanse your body of nicotine. Drink enough water and fluids to keep your urine clear or pale yellow. Do this after quitting to flush the nicotine from your body.  Learn to predict your moods. Do not let a bad situation be your excuse to have a cigarette. Some situations in your life might tempt you into wanting a cigarette.  Never have "just one" cigarette. It leads to wanting another and another. Remind yourself of your decision to quit.  Change habits associated with smoking. If you smoked while driving or when feeling stressed, try other activities to replace smoking. Stand up when drinking your coffee. Brush your teeth after eating. Sit in a different chair when you read the paper. Avoid alcohol while trying to quit, and try to drink fewer caffeinated beverages. Alcohol and caffeine may urge you to  smoke.  Avoid foods and drinks that can trigger a desire to smoke, such as sugary or spicy foods and alcohol.  Ask people who smoke not to smoke around you.  Have something planned to do right after eating or having a cup of coffee. For example, plan to take a walk or exercise.  Try a relaxation exercise to calm you down and decrease your stress. Remember, you may be tense and nervous for the first 2 weeks after you quit, but this will pass.  Find new activities to keep your hands busy. Play with a pen, coin, or rubber band. Doodle or draw things on paper.  Brush your teeth right after eating. This will help cut down on the craving for the taste of tobacco after meals. You can also try mouthwash.   Use oral substitutes in place of cigarettes. Try using lemon drops, carrots, cinnamon sticks, or chewing gum. Keep them handy so they are available when you have the urge to smoke.  When you have the urge to smoke, try deep breathing.  Designate your home as a nonsmoking area.  If you are a heavy smoker, ask your health care provider about a prescription for nicotine chewing gum. It can ease your withdrawal from nicotine.  Reward yourself. Set aside the cigarette money you save and buy yourself something nice.  Look for support from others. Join a support group or smoking cessation program. Ask someone at home or at work to help you with your plan   to quit smoking.  Always ask yourself, "Do I need this cigarette or is this just a reflex?" Tell yourself, "Today, I choose not to smoke," or "I do not want to smoke." You are reminding yourself of your decision to quit.  Do not replace cigarette smoking with electronic cigarettes (commonly called e-cigarettes). The safety of e-cigarettes is unknown, and some may contain harmful chemicals.  If you relapse, do not give up! Plan ahead and think about what you will do the next time you get the urge to smoke. HOW WILL I FEEL WHEN I QUIT SMOKING? You  may have symptoms of withdrawal because your body is used to nicotine (the addictive substance in cigarettes). You may crave cigarettes, be irritable, feel very hungry, cough often, get headaches, or have difficulty concentrating. The withdrawal symptoms are only temporary. They are strongest when you first quit but will go away within 10-14 days. When withdrawal symptoms occur, stay in control. Think about your reasons for quitting. Remind yourself that these are signs that your body is healing and getting used to being without cigarettes. Remember that withdrawal symptoms are easier to treat than the major diseases that smoking can cause.  Even after the withdrawal is over, expect periodic urges to smoke. However, these cravings are generally short lived and will go away whether you smoke or not. Do not smoke! WHAT RESOURCES ARE AVAILABLE TO HELP ME QUIT SMOKING? Your health care provider can direct you to community resources or hospitals for support, which may include:  Group support.  Education.  Hypnosis.  Therapy.   This information is not intended to replace advice given to you by your health care provider. Make sure you discuss any questions you have with your health care provider.   Document Released: 10/18/2003 Document Revised: 02/09/2014 Document Reviewed: 07/07/2012 Elsevier Interactive Patient Education 2016 [employer] Pain Management Discharge Instructions  General Discharge Instructions :  If you need to reach your doctor call: Monday-Friday 8:00 am - 4:00 pm at [phone] or toll free [phone].  After clinic hours [phone] to have operator reach doctor.  Bring all of your medication bottles to all your appointments in the pain clinic.  To cancel or reschedule your appointment with Pain Management please remember to call 24 hours in advance to avoid a fee.  Refer to the educational materials which you have been given on: General Risks, I had my Procedure.  Discharge Instructions, Post Sedation.  Post Procedure Instructions:  The drugs you were given will stay in your system until tomorrow, so for the next 24 hours you should not drive, make any legal decisions or drink any alcoholic beverages.  You may eat anything you prefer, but it is better to start with liquids then soups and crackers, and gradually work up to solid foods.  Please notify your doctor immediately if you have any unusual bleeding, trouble breathing or pain that is not related to your normal pain.  Depending on the type of procedure that was done, some parts of your body may feel week and/or numb.  This usually clears up by tonight or the next day.  Walk with the use of an assistive device or accompanied by an adult for the 24 hours.  You may use ice on the affected area for the first 24 hours.  Put ice in a Ziploc bag and cover with a towel and place against area 15 minutes on 15 minutes off.  You may switch to heat after 24 hours.Facet Joint Block  The facet joints connect the bones of the spine (vertebrae). They make it possible for you to bend, twist, and make other movements with your spine. They also prevent you from overbending, overtwisting, and making other excessive movements.  A facet joint block is a procedure where a numbing medicine (anesthetic) is injected into a facet joint. Often, a type of anti-inflammatory medicine called a steroid is also injected. A facet joint block may be done for two reasons:   Diagnosis. A facet joint block may be done as a test to see whether neck or back pain is caused by a worn-down or infected facet joint. If the pain gets better after a facet joint block, it means the pain is probably coming from the facet joint. If the pain does not get better, it means the pain is probably not coming from the facet joint.   Therapy. A facet joint block may be done to relieve neck or back pain caused by a facet joint. A facet joint block is only done  as a therapy if the pain does not improve with medicine, exercise programs, physical therapy, and other forms of pain management. LET [facility] CARE PROVIDER KNOW ABOUT:   Any allergies you have.   All medicines you are taking, including vitamins, herbs, eyedrops, and over-the-counter medicines and creams.   Previous problems you or members of your family have had with the use of anesthetics.   Any blood disorders you have had.   Other health problems you have. RISKS AND COMPLICATIONS Generally, having a facet joint block is safe. However, as with any procedure, complications can occur. Possible complications associated with having a facet joint block include:   Bleeding.   Injury to a nerve near the injection site.   Pain at the injection site.   Weakness or numbness in areas controlled by nerves near the injection site.   Infection.   Temporary fluid retention.   Allergic reaction to anesthetics or medicines used during the procedure. BEFORE THE PROCEDURE   Follow your health care provider's instructions if you are taking dietary supplements or medicines. You may need to stop taking them or reduce your dosage.   Do not take any new dietary supplements or medicines without asking your health care provider first.   Follow your health care provider's instructions about eating and drinking before the procedure. You may need to stop eating and drinking several hours before the procedure.   Arrange to have an adult drive you home after the procedure. PROCEDURE  You may need to remove your clothing and dress in an open-back gown so that your health care provider can access your spine.   The procedure will be done while you are lying on an X-ray table. Most of the time you will be asked to lie on your stomach, but you may be asked to lie in a different position if an injection will be made in your neck.   Special machines will be used to monitor your oxygen  levels, heart rate, and blood pressure.   If an injection will be made in your neck, an intravenous (IV) tube will be inserted into one of your veins. Fluids and medicine will flow directly into your body through the IV tube.   The area over the facet joint where the injection will be made will be cleaned with an antiseptic soap. The surrounding skin will be covered with sterile drapes.   An anesthetic will be applied to your skin to make  the injection area numb. You may feel a temporary stinging or burning sensation.   A video X-ray machine will be used to locate the joint. A contrast dye may be injected into the facet joint area to help with locating the joint.   When the joint is located, an anesthetic medicine will be injected into the joint through the needle.   Your health care provider will ask you whether you feel pain relief. If you do feel relief, a steroid may be injected to provide pain relief for a longer period of time. If you do not feel relief or feel only partial relief, additional injections of an anesthetic may be made in other facet joints.   The needle will be removed, the skin will be cleansed, and bandages will be applied.  AFTER THE PROCEDURE   You will be observed for 15-30 minutes before being allowed to go home. Do not drive. Have an adult drive you or take a taxi or public transportation instead.   If you feel pain relief, the pain will return in several hours or days when the anesthetic wears off.   You may feel pain relief 2-14 days after the procedure. The amount of time this relief lasts varies from person to person.   It is normal to feel some tenderness over the injected area(s) for 2 days following the procedure.   If you have diabetes, you may have a temporary increase in blood sugar.   This information is not intended to replace advice given to you by your health care provider. Make sure you discuss any questions you have with your health  care provider.   Document Released: 06/10/2006 Document Revised: 02/09/2014 Document Reviewed: 11/09/2011 Elsevier Interactive Patient Education [employer].

## 2015-06-21 ENCOUNTER — Telehealth: Payer: Self-pay | Admitting: *Deleted

## 2015-06-21 NOTE — Telephone Encounter (Signed)
Left message

## 2015-06-24 ENCOUNTER — Encounter: Payer: BLUE CROSS/BLUE SHIELD | Admitting: Pain Medicine

## 2015-07-09 ENCOUNTER — Ambulatory Visit: Payer: BLUE CROSS/BLUE SHIELD | Admitting: Pain Medicine

## 2015-09-09 ENCOUNTER — Encounter: Payer: BLUE CROSS/BLUE SHIELD | Admitting: Pain Medicine

## 2015-09-10 NOTE — Progress Notes (Signed)
Patient's Name: Kelly Rollins  Patient type: Established  MRN: [id number]  Service setting: Ambulatory outpatient  DOB: [birth year]  Location: [facility]  DOS: 09/11/2015  Primary Care Physician: FITZGERALD, DAVID Mamie Nick, MD  Note by: Kathlen Brunswick Dossie Arbour, M.D, DABA, Sarita Haver, DABPM, Milagros Evener, FIPP  Referring Physician: Leonel Ramsay, MD  Specialty: Board-Certified Interventional Pain Management  Last Visit to Pain Management: 06/21/2015   Primary Reason(s) for Visit: Encounter for prescription drug management & post-procedure evaluation of chronic illness with mild to moderate exacerbation(Level of risk: moderate) CC: Shoulder Pain (right) and Back Pain   HPI  Ms. Council is a 45 y.o. year old, female patient, who returns today as an established patient. She has TOBACCO ABUSE; GERD; Constipation; Pure hypercholesterolemia; Hyperlipidemia; Fatty liver disease; Anxiety; Obesity; Essential hypertriglyceridemia; Chronic pain; Long term current use of opiate analgesic; Long term prescription opiate use; Opiate use (25 MME/Day); Encounter for therapeutic drug level monitoring; Encounter for pain management planning; Chronic low back pain (Location of Primary Source of Pain) (Bilateral) (L>R); Lumbar facet syndrome (Location of Primary Source of Pain) (Bilateral) (L>R); Chronic hip pain (Location of Secondary source of pain) (Bilateral) (L>R); Chronic knee pain (Location of Tertiary source of pain) (Bilateral) (L>R); Lumbar spondylosis; Neuropathic pain; Neurogenic pain; Myofascial pain; Acute low back pain; and Lumbar transverse process fracture (HCC) (Left L1) (03/11/15) on her problem list.. Her primarily concern today is the Shoulder Pain (right) and Back Pain   Pain Assessment: Self-Reported Pain Score: 2              Reported level is compatible with observation       Pain Type: Chronic pain Pain Location: Back Pain Orientation: Right Pain Descriptors / Indicators:  Aching, Sharp Pain Frequency: Constant  The patient comes into the clinics today for post-procedure evaluation on the interventional treatment done on 06/20/2015. In addition, she comes in today for pharmacological management of her chronic pain.  The patient  reports that she does not use drugs.  Date of Last Visit: 06/20/15 Service Provided on Last Visit: Procedure (blf)  Controlled Substance Pharmacotherapy Assessment & REMS (Risk Evaluation and Mitigation Strategy)  Analgesic: Hydrocodone/APAP 5/325 one tablet 5 times a day (25 mg/day) MME/day: 25 mg/day Pill Count: Did not bring pain meds for pill count.  Reminded to bring to all appointments. States oxycodone makes her nauseated, gives her a headache. Final warning given. Pharmacokinetics: Onset of action (Liberation/Absorption): Within expected pharmacological parameters Time to Peak effect (Distribution): Timing and results are as within normal expected parameters Duration of action (Metabolism/Excretion): Within normal limits for medication Pharmacodynamics: Analgesic Effect: More than 50% Activity Facilitation: Medication(s) allow patient to sit, stand, walk, and do the basic ADLs Perceived Effectiveness: Described as relatively effective, allowing for increase in activities of daily living (ADL) Side-effects or Adverse reactions: None reported Monitoring: [place] PMP: Online review of the past 40-month period conducted. Compliant with practice rules and regulations Last UDS on record: ToxAssure Select 13  Date Value Ref Range Status  05/29/2015 FINAL  Final    Comment:    ==================================================================== TOXASSURE SELECT 13 (MW) ==================================================================== Test                             Result       Flag       Units Drug Present and Declared for Prescription Verification   Hydrocodone  3507         EXPECTED   ng/mg creat    Hydromorphone                  1175         EXPECTED   ng/mg creat   Dihydrocodeine                 438          EXPECTED   ng/mg creat   Norhydrocodone                 2664         EXPECTED   ng/mg creat    Sources of hydrocodone include scheduled prescription    medications. Hydromorphone, dihydrocodeine and norhydrocodone are    expected metabolites of hydrocodone. Hydromorphone and    dihydrocodeine are also available as scheduled prescription    medications. ==================================================================== Test                      Result    Flag   Units      Ref Range   Creatinine              55               mg/dL      >=20 ==================================================================== Declared Medications:  The flagging and interpretation on this report are based on the  following declared medications.  Unexpected results may arise from  inaccuracies in the declared medications.  **Note: The testing scope of this panel includes these medications:  Hydrocodone (Hydrocodone-Acetaminophen)  **Note: The testing scope of this panel does not include following  reported medications:  Acetaminophen (Hydrocodone-Acetaminophen)  Gabapentin (Neurontin)  Meloxicam (Mobic)  Methocarbamol (Robaxin)  Multivitamin (MVI)  Vitamin C ==================================================================== For clinical consultation, please call [phone]. ====================================================================    UDS interpretation: Compliant          Medication Assessment Form: Reviewed. Patient indicates being compliant with therapy Treatment compliance: Compliant Risk Assessment: Aberrant Behavior: None observed today Substance Use Disorder (SUD) Risk Level: Low Risk of opioid abuse or dependence: 0.7-3.0% with doses ? 36 MME/day and 6.1-26% with doses ? 120 MME/day. Opioid Risk Tool (ORT) Score: Total Score: 0 Low Risk for SUD (Score  <3) Depression Scale Score: PHQ-2: PHQ-2 Total Score: 0 No depression (0) PHQ-9: PHQ-9 Total Score: 0 No depression (0-4)  Pharmacologic Plan: No change in therapy, at this time  Post-Procedure Assessment  Procedure done on last visit: Diagnostic bilateral Lumbar Facet Block #2 under fluoroscopic guidance and IV sedation + left L1 transverse process fracture injection. Side-effects or Adverse reactions: None reported Sedation: Sedation given  Results: Ultra-Short Term Relief (First 1 hour after procedure): 100 %  Analgesia during this period is likely to be Local Anesthetic and/or IV Sedative (Analgesic/Anxiolitic) related Short Term Relief (Initial 4-6 hrs after procedure): 100 % Complete relief confirms area to be the source of pain Long Term Relief : 95 % (ongoing) Long-term benefit would suggest an inflammatory etiology to the pain   Current Relief (Now): 95%  Persistent relief would suggest effective anti-inflammatory effects from steroids Interpretation of Results: Clearly this mode of therapy is very effective in controlling her pain. We will repeat when necessary as needed. In addition, because the patient has obtained 100% relief of the pain for the duration of local anesthetic in both of the diagnostic injections, she would definitely be a good candidate for radiofrequency ablation of  the lumbar facet medial branches.  Laboratory Chemistry  Inflammation Markers Lab Results  Component Value Date   ESRSEDRATE 23 (H) 04/27/2013    Renal Function Lab Results  Component Value Date   BUN 12 03/18/2013   CREATININE 0.96 03/18/2013   GFRAA >60 03/18/2013   GFRNONAA >60 03/18/2013    Hepatic Function Lab Results  Component Value Date   AST 28 03/18/2013   ALT 37 03/18/2013   ALBUMIN 3.9 03/18/2013    Electrolytes Lab Results  Component Value Date   NA 137 03/18/2013   K 3.9 03/18/2013   CL 106 03/18/2013   CALCIUM 9.4 03/18/2013   MG 1.8 04/27/2013    Pain  Modulating Vitamins No results found for: Maralyn Sago [id number], [id number], [id number], 25OHVITD1, 25OHVITD2, 25OHVITD3, VITAMINB12  Coagulation Parameters Lab Results  Component Value Date   PLT 260 03/18/2013    Cardiovascular Lab Results  Component Value Date   HGB 14.2 03/18/2013   HCT 41.1 03/18/2013    Note: Lab results reviewed.  Recent Diagnostic Imaging  Ct Cervical Spine Wo Contrast  Result Date: 03/11/2015 CLINICAL DATA:  Pain following fall EXAM: CT CERVICAL, THORACIC, AND LUMBAR SPINE WITHOUT CONTRAST TECHNIQUE: Multidetector CT imaging of the cervical, thoracic and lumbar spine was performed without intravenous contrast. Multiplanar CT image reconstructions were also generated. COMPARISON:  None. FINDINGS: CT CERVICAL SPINE FINDINGS There is no fracture or spondylolisthesis. Prevertebral soft tissues and predental space regions are normal. The disc spaces appear normal. No nerve root edema or effacement. No disc extrusion or stenosis. The visualized brain parenchyma appears normal. The visualized mastoid air cells are clear. Visualized lung apices are clear. CT THORACIC SPINE FINDINGS There is no demonstrable fracture or spondylolisthesis. There is slight mid thoracic dextroscoliosis. There are multiple anterior osteophytes. There is slight disc space narrowing at several levels in the lower thoracic region. There is mild facet hypertrophy at several levels without nerve root edema or effacement. No disc extrusion or stenosis is evident. The visualized lung regions appear clear. No paraspinous lesions are appreciable. CT LUMBAR SPINE FINDINGS There is a nondisplaced fracture of the lateral aspect of the left L1 transverse process. No other fracture is evident. There is no spondylolisthesis. The disc spaces appear normal. No paraspinous lesions are identified. No nerve root edema or effacement. No disc extrusions or stenosis. IMPRESSION: CT cervical spine: No fracture or  spondylolisthesis. No appreciable arthropathy. CT thoracic spine: No fracture or spondylolisthesis. Mild osteoarthritic change at multiple levels. No disc extrusion or stenosis. No nerve root edema or effacement. CT lumbar spine: Nondisplaced fracture L1 transverse process on the left. No other evidence of fracture. No spondylolisthesis. No appreciable disc space narrowing. No disc extrusion or stenosis. Electronically Signed   By: Lowella Grip III M.D.   On: 03/11/2015 09:26   Ct Thoracic Spine Wo Contrast  Result Date: 03/11/2015 CLINICAL DATA:  Pain following fall EXAM: CT CERVICAL, THORACIC, AND LUMBAR SPINE WITHOUT CONTRAST TECHNIQUE: Multidetector CT imaging of the cervical, thoracic and lumbar spine was performed without intravenous contrast. Multiplanar CT image reconstructions were also generated. COMPARISON:  None. FINDINGS: CT CERVICAL SPINE FINDINGS There is no fracture or spondylolisthesis. Prevertebral soft tissues and predental space regions are normal. The disc spaces appear normal. No nerve root edema or effacement. No disc extrusion or stenosis. The visualized brain parenchyma appears normal. The visualized mastoid air cells are clear. Visualized lung apices are clear. CT THORACIC SPINE FINDINGS There is no demonstrable fracture or spondylolisthesis. There is slight  mid thoracic dextroscoliosis. There are multiple anterior osteophytes. There is slight disc space narrowing at several levels in the lower thoracic region. There is mild facet hypertrophy at several levels without nerve root edema or effacement. No disc extrusion or stenosis is evident. The visualized lung regions appear clear. No paraspinous lesions are appreciable. CT LUMBAR SPINE FINDINGS There is a nondisplaced fracture of the lateral aspect of the left L1 transverse process. No other fracture is evident. There is no spondylolisthesis. The disc spaces appear normal. No paraspinous lesions are identified. No nerve root edema  or effacement. No disc extrusions or stenosis. IMPRESSION: CT cervical spine: No fracture or spondylolisthesis. No appreciable arthropathy. CT thoracic spine: No fracture or spondylolisthesis. Mild osteoarthritic change at multiple levels. No disc extrusion or stenosis. No nerve root edema or effacement. CT lumbar spine: Nondisplaced fracture L1 transverse process on the left. No other evidence of fracture. No spondylolisthesis. No appreciable disc space narrowing. No disc extrusion or stenosis. Electronically Signed   By: Lowella Grip III M.D.   On: 03/11/2015 09:26   Ct Lumbar Spine Wo Contrast  Result Date: 03/11/2015 CLINICAL DATA:  Pain following fall EXAM: CT CERVICAL, THORACIC, AND LUMBAR SPINE WITHOUT CONTRAST TECHNIQUE: Multidetector CT imaging of the cervical, thoracic and lumbar spine was performed without intravenous contrast. Multiplanar CT image reconstructions were also generated. COMPARISON:  None. FINDINGS: CT CERVICAL SPINE FINDINGS There is no fracture or spondylolisthesis. Prevertebral soft tissues and predental space regions are normal. The disc spaces appear normal. No nerve root edema or effacement. No disc extrusion or stenosis. The visualized brain parenchyma appears normal. The visualized mastoid air cells are clear. Visualized lung apices are clear. CT THORACIC SPINE FINDINGS There is no demonstrable fracture or spondylolisthesis. There is slight mid thoracic dextroscoliosis. There are multiple anterior osteophytes. There is slight disc space narrowing at several levels in the lower thoracic region. There is mild facet hypertrophy at several levels without nerve root edema or effacement. No disc extrusion or stenosis is evident. The visualized lung regions appear clear. No paraspinous lesions are appreciable. CT LUMBAR SPINE FINDINGS There is a nondisplaced fracture of the lateral aspect of the left L1 transverse process. No other fracture is evident. There is no spondylolisthesis.  The disc spaces appear normal. No paraspinous lesions are identified. No nerve root edema or effacement. No disc extrusions or stenosis. IMPRESSION: CT cervical spine: No fracture or spondylolisthesis. No appreciable arthropathy. CT thoracic spine: No fracture or spondylolisthesis. Mild osteoarthritic change at multiple levels. No disc extrusion or stenosis. No nerve root edema or effacement. CT lumbar spine: Nondisplaced fracture L1 transverse process on the left. No other evidence of fracture. No spondylolisthesis. No appreciable disc space narrowing. No disc extrusion or stenosis. Electronically Signed   By: Lowella Grip III M.D.   On: 03/11/2015 09:26    Meds  The patient has a current medication list which includes the following prescription(s): albuterol, vitamin c, cyclobenzaprine, gabapentin, multivitamin, oxycodone, oxycodone, oxycodone, hydrochlorothiazide, and meloxicam.  Current Outpatient Prescriptions on File Prior to Visit  Medication Sig  . albuterol (PROAIR HFA) 108 (90 Base) MCG/ACT inhaler Inhale into the lungs.  . Ascorbic Acid (VITAMIN C) 100 MG tablet Take 100 mg by mouth daily.  . Multiple Vitamin (MULTIVITAMIN) tablet Take 1 tablet by mouth daily.   No current facility-administered medications on file prior to visit.     ROS  Constitutional: Denies any fever or chills Gastrointestinal: No reported hemesis, hematochezia, vomiting, or acute GI distress Musculoskeletal:  Denies any acute onset joint swelling, redness, loss of ROM, or weakness Neurological: No reported episodes of acute onset apraxia, aphasia, dysarthria, agnosia, amnesia, paralysis, loss of coordination, or loss of consciousness  Allergies  Ms. Adolphe is allergic to morphine; penicillins; and tramadol hcl.  [place]  Medical:  Ms. Mann  has a past medical history of ABDOMINAL PAIN [place] (02/21/2009); ABDOMINAL PAIN-LUQ (04/17/2009); Anal fissure; Headache(784.0); HEMATOCHEZIA  (04/17/2009); Hypertension; IBS (irritable bowel syndrome); Migraine headache (11/06/2008); OVARIAN CYST, RIGHT (02/21/2009); and PALPITATIONS, OCCASIONAL (04/17/2010). Family: family history includes Cancer in her mother; Heart disease in her father; Hyperlipidemia in her father and mother; Hypertension in her father and mother. Surgical:  has a past surgical history that includes Cesarean section; Vaginal hysterectomy; Cholecystectomy; and Breast reduction surgery. Tobacco:  reports that she has been smoking.  She has never used smokeless tobacco. Alcohol:  reports that she drinks alcohol. Drug:  reports that she does not use drugs.  Constitutional Exam  Vitals: Blood pressure (!) 164/82, pulse (!) 102, temperature 98.1 F (36.7 C), resp. rate 18, height 5\' 4"  (1.626 m), weight 200 lb (90.7 kg), SpO2 98 %. General appearance: Well nourished, well developed, and well hydrated. In no acute distress Calculated BMI/Body habitus: Body mass index is 34.33 kg/m. (30-34.9 kg/m2) Obese (Class I) - 68% higher incidence of chronic pain Psych/Mental status: Alert and oriented x 3 (person, place, & time) Eyes: PERLA Respiratory: No evidence of acute respiratory distress  Cervical Spine Exam  Inspection: No masses, redness, or swelling Alignment: Symmetrical Functional ROM: ROM appears unrestricted Stability: No instability detected Muscle strength & Tone: Functionally intact Sensory: Unimpaired Palpation: Non-contributory  Upper Extremity (UE) Exam    Side: Right upper extremity  Side: Left upper extremity  Inspection: No masses, redness, swelling, or asymmetry  Inspection: No masses, redness, swelling, or asymmetry  Functional ROM: ROM appears unrestricted  Functional ROM: ROM appears unrestricted  Muscle strength & Tone: Functionally intact  Muscle strength & Tone: Functionally intact  Sensory: Unimpaired  Sensory: Unimpaired  Palpation: Non-contributory  Palpation: Non-contributory   Thoracic  Spine Exam  Inspection: No masses, redness, or swelling Alignment: Symmetrical Functional ROM: ROM appears unrestricted Stability: No instability detected Sensory: Unimpaired Muscle strength & Tone: Functionally intact Palpation: Non-contributory  Lumbar Spine Exam  Inspection: No masses, redness, or swelling Alignment: Symmetrical Functional ROM: ROM appears unrestricted Stability: No instability detected Muscle strength & Tone: Functionally intact Sensory: Unimpaired Palpation: Non-contributory Provocative Tests: Lumbar Hyperextension and rotation test: Positive bilaterally for facet joint pain. Patrick's Maneuver: evaluation deferred today              Gait & Posture Assessment  Ambulation: Unassisted Gait: Relatively normal for age and body habitus Posture: WNL   Lower Extremity Exam    Side: Right lower extremity  Side: Left lower extremity  Inspection: No masses, redness, swelling, or asymmetry  Inspection: No masses, redness, swelling, or asymmetry  Functional ROM: ROM appears unrestricted  Functional ROM: ROM appears unrestricted  Muscle strength & Tone: Functionally intact  Muscle strength & Tone: Functionally intact  Sensory: Unimpaired  Sensory: Unimpaired  Palpation: Non-contributory  Palpation: Non-contributory    Assessment & Plan  Primary Diagnosis & Pertinent Problem List: The primary encounter diagnosis was Chronic pain. Diagnoses of Long term current use of opiate analgesic, Opiate use (25 MME/Day), Chronic low back pain (Location of Primary Source of Pain) (Bilateral) (L>R), Chronic hip pain, unspecified laterality, Chronic knee pain (Location of Tertiary source of pain) (Bilateral) (L>R),  Lumbar facet syndrome (Location of Primary Source of Pain) (Bilateral) (L>R), Lumbar spondylosis, unspecified spinal osteoarthritis, Myofascial pain, and Neurogenic pain were also pertinent to this visit.  Visit Diagnosis: 1. Chronic pain   2. Long term current use of  opiate analgesic   3. Opiate use (25 MME/Day)   4. Chronic low back pain (Location of Primary Source of Pain) (Bilateral) (L>R)   5. Chronic hip pain, unspecified laterality   6. Chronic knee pain (Location of Tertiary source of pain) (Bilateral) (L>R)   7. Lumbar facet syndrome (Location of Primary Source of Pain) (Bilateral) (L>R)   8. Lumbar spondylosis, unspecified spinal osteoarthritis   9. Myofascial pain   10. Neurogenic pain     Problems updated and reviewed during this visit: No problems updated.  Problem-specific Plan(s): No problem-specific Assessment & Plan notes found for this encounter.  No new Assessment & Plan notes have been filed under this hospital service since the last note was generated. Service: Pain Management   Plan of Care   Problem List Items Addressed This Visit      High   Chronic hip pain (Location of Secondary source of pain) (Bilateral) (L>R) (Chronic)   Relevant Medications   oxyCODONE (OXY IR/ROXICODONE) 5 MG immediate release tablet   oxyCODONE (OXY IR/ROXICODONE) 5 MG immediate release tablet   oxyCODONE (OXY IR/ROXICODONE) 5 MG immediate release tablet   cyclobenzaprine (FLEXERIL) 5 MG tablet   gabapentin (NEURONTIN) 100 MG capsule   meloxicam (MOBIC) 15 MG tablet   Other Relevant Orders   HIP INJECTION   Chronic knee pain (Location of Tertiary source of pain) (Bilateral) (L>R) (Chronic)   Relevant Medications   oxyCODONE (OXY IR/ROXICODONE) 5 MG immediate release tablet   oxyCODONE (OXY IR/ROXICODONE) 5 MG immediate release tablet   oxyCODONE (OXY IR/ROXICODONE) 5 MG immediate release tablet   cyclobenzaprine (FLEXERIL) 5 MG tablet   gabapentin (NEURONTIN) 100 MG capsule   meloxicam (MOBIC) 15 MG tablet   Other Relevant Orders   KNEE INJECTION   Chronic low back pain (Location of Primary Source of Pain) (Bilateral) (L>R) (Chronic)   Relevant Medications   oxyCODONE (OXY IR/ROXICODONE) 5 MG immediate release tablet   oxyCODONE (OXY  IR/ROXICODONE) 5 MG immediate release tablet   oxyCODONE (OXY IR/ROXICODONE) 5 MG immediate release tablet   cyclobenzaprine (FLEXERIL) 5 MG tablet   meloxicam (MOBIC) 15 MG tablet   Other Relevant Orders   LUMBAR FACET(MEDIAL BRANCH NERVE BLOCK) MBNB   Chronic pain - Primary (Chronic)   Relevant Medications   oxyCODONE (OXY IR/ROXICODONE) 5 MG immediate release tablet   oxyCODONE (OXY IR/ROXICODONE) 5 MG immediate release tablet   oxyCODONE (OXY IR/ROXICODONE) 5 MG immediate release tablet   cyclobenzaprine (FLEXERIL) 5 MG tablet   gabapentin (NEURONTIN) 100 MG capsule   meloxicam (MOBIC) 15 MG tablet   Other Relevant Orders   Comprehensive metabolic panel   C-reactive protein   Magnesium   Sedimentation rate   Vitamin B12   25-Hydroxyvitamin D Lcms D2+D3   Lumbar facet syndrome (Location of Primary Source of Pain) (Bilateral) (L>R) (Chronic)   Relevant Medications   oxyCODONE (OXY IR/ROXICODONE) 5 MG immediate release tablet   oxyCODONE (OXY IR/ROXICODONE) 5 MG immediate release tablet   oxyCODONE (OXY IR/ROXICODONE) 5 MG immediate release tablet   cyclobenzaprine (FLEXERIL) 5 MG tablet   meloxicam (MOBIC) 15 MG tablet   Other Relevant Orders   LUMBAR FACET(MEDIAL BRANCH NERVE BLOCK) MBNB   Lumbar spondylosis (Chronic)   Relevant Medications  oxyCODONE (OXY IR/ROXICODONE) 5 MG immediate release tablet   oxyCODONE (OXY IR/ROXICODONE) 5 MG immediate release tablet   oxyCODONE (OXY IR/ROXICODONE) 5 MG immediate release tablet   cyclobenzaprine (FLEXERIL) 5 MG tablet   meloxicam (MOBIC) 15 MG tablet   Myofascial pain (Chronic)   Relevant Medications   cyclobenzaprine (FLEXERIL) 5 MG tablet   Neurogenic pain (Chronic)   Relevant Medications   gabapentin (NEURONTIN) 100 MG capsule     Medium   Long term current use of opiate analgesic (Chronic)   Opiate use (25 MME/Day) (Chronic)    Other Visit Diagnoses   None.      Pharmacotherapy (Medications Ordered): Meds  ordered this encounter  Medications  . oxyCODONE (OXY IR/ROXICODONE) 5 MG immediate release tablet    Sig: Take 1 tablet (5 mg total) by mouth 5 (five) times daily as needed for severe pain.    Dispense:  150 tablet    Refill:  0    Do not place this medication, or any other prescription from our practice, on "Automatic Refill". Patient may have prescription filled one day early if pharmacy is closed on scheduled refill date. Do not fill until: 09/25/15 To last until: 10/25/15  . oxyCODONE (OXY IR/ROXICODONE) 5 MG immediate release tablet    Sig: Take 1 tablet (5 mg total) by mouth 5 (five) times daily as needed for severe pain.    Dispense:  150 tablet    Refill:  0    Do not place this medication, or any other prescription from our practice, on "Automatic Refill". Patient may have prescription filled one day early if pharmacy is closed on scheduled refill date. Do not fill until: 10/25/15 To last until: 11/24/15  . oxyCODONE (OXY IR/ROXICODONE) 5 MG immediate release tablet    Sig: Take 1 tablet (5 mg total) by mouth 5 (five) times daily as needed for severe pain.    Dispense:  150 tablet    Refill:  0    Do not place this medication, or any other prescription from our practice, on "Automatic Refill". Patient may have prescription filled one day early if pharmacy is closed on scheduled refill date. Do not fill until: 11/24/15 To last until: 12/24/15  . DISCONTD: meloxicam (MOBIC) 15 MG tablet    Sig: Take 1 tablet (15 mg total) by mouth daily.    Dispense:  30 tablet    Refill:  2    Do not place this medication, or any other prescription from our practice, on "Automatic Refill". Patient may have prescription filled one day early if pharmacy is closed on scheduled refill date.  . cyclobenzaprine (FLEXERIL) 5 MG tablet    Sig: Take 1 tablet (5 mg total) by mouth 3 (three) times daily as needed for muscle spasms.    Dispense:  90 tablet    Refill:  2    Do not place this medication,  or any other prescription from our practice, on "Automatic Refill". Patient may have prescription filled one day early if pharmacy is closed on scheduled refill date.  . gabapentin (NEURONTIN) 100 MG capsule    Sig: Take 1-3 capsules (100-300 mg total) by mouth at bedtime.    Dispense:  90 capsule    Refill:  2    Do not place this medication, or any other prescription from our practice, on "Automatic Refill". Patient may have prescription filled one day early if pharmacy is closed on scheduled refill date.    Lab-work & Procedure Ordered:  Orders Placed This Encounter  Procedures  . LUMBAR FACET(MEDIAL BRANCH NERVE BLOCK) MBNB  . HIP INJECTION  . KNEE INJECTION  . Comprehensive metabolic panel  . C-reactive protein  . Magnesium  . Sedimentation rate  . Vitamin B12  . 25-Hydroxyvitamin D Lcms D2+D3    Imaging Ordered: None  Interventional Therapies: Scheduled:  None at this point.    Considering:   Diagnostic bilateral hip joint injection under fluoro, with or without sedation. Palliative bilateral lumbar facet block under fluoro and sedation. Bilateral lumbar facet radiofrequency ablation.    PRN Procedures:  Diagnostic bilateral hip joint injection under fluoro, with or without sedation. Bilateral lumbar facet block under fluoroscopic guidance and IV sedation plus left L1 transverse process injection    Referral(s) or Consult(s): None at this time.  New Prescriptions   No medications on file    Medications administered during this visit: Ms. Junior had no medications administered during this visit.  Requested PM Follow-up: Return in 3 months (on 12/02/2015) for Med-Mgmt, In addition, (PRN) Procedure.  Future Appointments Date Time Provider [place]  11/27/2015 1:20 PM Milinda Pointer, MD [facility] None    Primary Care Physician: Leonel Ramsay, MD Location: [facility] Note by: Kathlen Brunswick Dossie Arbour, M.D, DABA,  DABAPM, DABPM, DABIPP, FIPP  Pain Score Disclaimer: We use the NRS-11 scale. This is a self-reported, subjective measurement of pain severity with only modest accuracy. It is used primarily to identify changes within a particular patient. It must be understood that outpatient pain scales are significantly less accurate that those used for research, where they can be applied under ideal controlled circumstances with minimal exposure to variables. In reality, the score is likely to be a combination of pain intensity and pain affect, where pain affect describes the degree of emotional arousal or changes in action readiness caused by the sensory experience of pain. Factors such as social and work situation, setting, emotional state, anxiety levels, expectation, and prior pain experience may influence pain perception and show large inter-individual differences that may also be affected by time variables.  Patient instructions provided at this appointment:: Patient Instructions  Smoking Cessation, Tips for Success If you are ready to quit smoking, congratulations! You have chosen to help yourself be healthier. Cigarettes bring nicotine, tar, carbon monoxide, and other irritants into your body. Your lungs, heart, and blood vessels will be able to work better without these poisons. There are many different ways to quit smoking. Nicotine gum, nicotine patches, a nicotine inhaler, or nicotine nasal spray can help with physical craving. Hypnosis, support groups, and medicines help break the habit of smoking. WHAT THINGS CAN I DO TO MAKE QUITTING EASIER?  Here are some tips to help you quit for good:  Pick a date when you will quit smoking completely. Tell all of your friends and family about your plan to quit on that date.  Do not try to slowly cut down on the number of cigarettes you are smoking. Pick a quit date and quit smoking completely starting on that day.  Throw away all cigarettes.   Clean and  remove all ashtrays from your home, work, and car.  On a card, write down your reasons for quitting. Carry the card with you and read it when you get the urge to smoke.  Cleanse your body of nicotine. Drink enough water and fluids to keep your urine clear or pale yellow. Do this after quitting to flush the nicotine from your body.  Learn to predict your moods. Do not let a bad situation be your excuse to have a cigarette. Some situations in your life might tempt you into wanting a cigarette.  Never have "just one" cigarette. It leads to wanting another and another. Remind yourself of your decision to quit.  Change habits associated with smoking. If you smoked while driving or when feeling stressed, try other activities to replace smoking. Stand up when drinking your coffee. Brush your teeth after eating. Sit in a different chair when you read the paper. Avoid alcohol while trying to quit, and try to drink fewer caffeinated beverages. Alcohol and caffeine may urge you to smoke.  Avoid foods and drinks that can trigger a desire to smoke, such as sugary or spicy foods and alcohol.  Ask people who smoke not to smoke around you.  Have something planned to do right after eating or having a cup of coffee. For example, plan to take a walk or exercise.  Try a relaxation exercise to calm you down and decrease your stress. Remember, you may be tense and nervous for the first 2 weeks after you quit, but this will pass.  Find new activities to keep your hands busy. Play with a pen, coin, or rubber band. Doodle or draw things on paper.  Brush your teeth right after eating. This will help cut down on the craving for the taste of tobacco after meals. You can also try mouthwash.   Use oral substitutes in place of cigarettes. Try using lemon drops, carrots, cinnamon sticks, or chewing gum. Keep them handy so they are available when you have the urge to smoke.  When you have the urge to smoke, try deep  breathing.  Designate your home as a nonsmoking area.  If you are a heavy smoker, ask your health care provider about a prescription for nicotine chewing gum. It can ease your withdrawal from nicotine.  Reward yourself. Set aside the cigarette money you save and buy yourself something nice.  Look for support from others. Join a support group or smoking cessation program. Ask someone at home or at work to help you with your plan to quit smoking.  Always ask yourself, "Do I need this cigarette or is this just a reflex?" Tell yourself, "Today, I choose not to smoke," or "I do not want to smoke." You are reminding yourself of your decision to quit.  Do not replace cigarette smoking with electronic cigarettes (commonly called e-cigarettes). The safety of e-cigarettes is unknown, and some may contain harmful chemicals.  If you relapse, do not give up! Plan ahead and think about what you will do the next time you get the urge to smoke. HOW WILL I FEEL WHEN I QUIT SMOKING? You may have symptoms of withdrawal because your body is used to nicotine (the addictive substance in cigarettes). You may crave cigarettes, be irritable, feel very hungry, cough often, get headaches, or have difficulty concentrating. The withdrawal symptoms are only temporary. They are strongest when you first quit but will go away within 10-14 days. When withdrawal symptoms occur, stay in control. Think about your reasons for quitting. Remind yourself that these are signs that your body is healing and getting used to being without cigarettes. Remember that withdrawal symptoms are easier to treat than the major diseases that smoking can cause.  Even after the withdrawal is over, expect periodic urges to smoke. However, these cravings are generally short lived and will go away whether you smoke or not.  Do not smoke! WHAT RESOURCES ARE AVAILABLE TO HELP ME QUIT SMOKING? Your health care provider can direct you to community resources or  hospitals for support, which may include:  Group support.  Education.  Hypnosis.  Therapy.   This information is not intended to replace advice given to you by your health care provider. Make sure you discuss any questions you have with your health care provider.   Document Released: 10/18/2003 Document Revised: 02/09/2014 Document Reviewed: 07/07/2012 Elsevier Interactive Patient Education [employer].

## 2015-09-11 ENCOUNTER — Encounter: Payer: Self-pay | Admitting: Pain Medicine

## 2015-09-11 ENCOUNTER — Ambulatory Visit: Payer: BLUE CROSS/BLUE SHIELD | Attending: Pain Medicine | Admitting: Pain Medicine

## 2015-09-11 VITALS — BP 164/82 | HR 102 | Temp 98.1°F | Resp 18 | Ht 64.0 in | Wt 200.0 lb

## 2015-09-11 DIAGNOSIS — M25562 Pain in left knee: Secondary | ICD-10-CM | POA: Diagnosis not present

## 2015-09-11 DIAGNOSIS — S32018A Other fracture of first lumbar vertebra, initial encounter for closed fracture: Secondary | ICD-10-CM | POA: Insufficient documentation

## 2015-09-11 DIAGNOSIS — F1190 Opioid use, unspecified, uncomplicated: Secondary | ICD-10-CM

## 2015-09-11 DIAGNOSIS — M25561 Pain in right knee: Secondary | ICD-10-CM

## 2015-09-11 DIAGNOSIS — K760 Fatty (change of) liver, not elsewhere classified: Secondary | ICD-10-CM | POA: Diagnosis not present

## 2015-09-11 DIAGNOSIS — E781 Pure hyperglyceridemia: Secondary | ICD-10-CM | POA: Diagnosis not present

## 2015-09-11 DIAGNOSIS — M25552 Pain in left hip: Secondary | ICD-10-CM | POA: Insufficient documentation

## 2015-09-11 DIAGNOSIS — E669 Obesity, unspecified: Secondary | ICD-10-CM | POA: Diagnosis not present

## 2015-09-11 DIAGNOSIS — G8929 Other chronic pain: Secondary | ICD-10-CM | POA: Diagnosis not present

## 2015-09-11 DIAGNOSIS — M25559 Pain in unspecified hip: Secondary | ICD-10-CM

## 2015-09-11 DIAGNOSIS — F17210 Nicotine dependence, cigarettes, uncomplicated: Secondary | ICD-10-CM | POA: Insufficient documentation

## 2015-09-11 DIAGNOSIS — F419 Anxiety disorder, unspecified: Secondary | ICD-10-CM | POA: Diagnosis not present

## 2015-09-11 DIAGNOSIS — E785 Hyperlipidemia, unspecified: Secondary | ICD-10-CM | POA: Insufficient documentation

## 2015-09-11 DIAGNOSIS — M545 Low back pain: Secondary | ICD-10-CM | POA: Diagnosis not present

## 2015-09-11 DIAGNOSIS — X58XXXA Exposure to other specified factors, initial encounter: Secondary | ICD-10-CM | POA: Insufficient documentation

## 2015-09-11 DIAGNOSIS — M792 Neuralgia and neuritis, unspecified: Secondary | ICD-10-CM

## 2015-09-11 DIAGNOSIS — Z79891 Long term (current) use of opiate analgesic: Secondary | ICD-10-CM

## 2015-09-11 DIAGNOSIS — M47816 Spondylosis without myelopathy or radiculopathy, lumbar region: Secondary | ICD-10-CM

## 2015-09-11 DIAGNOSIS — K5900 Constipation, unspecified: Secondary | ICD-10-CM | POA: Diagnosis not present

## 2015-09-11 DIAGNOSIS — M25551 Pain in right hip: Secondary | ICD-10-CM | POA: Insufficient documentation

## 2015-09-11 DIAGNOSIS — M7918 Myalgia, other site: Secondary | ICD-10-CM

## 2015-09-11 DIAGNOSIS — K219 Gastro-esophageal reflux disease without esophagitis: Secondary | ICD-10-CM | POA: Diagnosis not present

## 2015-09-11 DIAGNOSIS — E7800 Pure hypercholesterolemia, unspecified: Secondary | ICD-10-CM | POA: Diagnosis not present

## 2015-09-11 DIAGNOSIS — M25511 Pain in right shoulder: Secondary | ICD-10-CM | POA: Diagnosis present

## 2015-09-11 DIAGNOSIS — M791 Myalgia: Secondary | ICD-10-CM | POA: Diagnosis not present

## 2015-09-11 DIAGNOSIS — M546 Pain in thoracic spine: Secondary | ICD-10-CM | POA: Diagnosis present

## 2015-09-11 MED ORDER — OXYCODONE HCL 5 MG PO TABS: 5.0000 mg | ORAL_TABLET | Freq: Every day | ORAL | 0 refills | Status: DC | PRN

## 2015-09-11 MED ORDER — CYCLOBENZAPRINE HCL 5 MG PO TABS: 5.0000 mg | ORAL_TABLET | Freq: Three times a day (TID) | ORAL | 2 refills | Status: DC | PRN

## 2015-09-11 MED ORDER — GABAPENTIN 100 MG PO CAPS: 100.0000 mg | ORAL_CAPSULE | Freq: Every day | ORAL | 2 refills | Status: DC

## 2015-09-11 MED ORDER — MELOXICAM 15 MG PO TABS: 15.0000 mg | ORAL_TABLET | Freq: Every day | ORAL | 2 refills | Status: DC

## 2015-09-11 NOTE — Patient Instructions (Signed)
Smoking Cessation, Tips for Success If you are ready to quit smoking, congratulations! You have chosen to help yourself be healthier. Cigarettes bring nicotine, tar, carbon monoxide, and other irritants into your body. Your lungs, heart, and blood vessels will be able to work better without these poisons. There are many different ways to quit smoking. Nicotine gum, nicotine patches, a nicotine inhaler, or nicotine nasal spray can help with physical craving. Hypnosis, support groups, and medicines help break the habit of smoking. WHAT THINGS CAN I DO TO MAKE QUITTING EASIER?  Here are some tips to help you quit for good:  Pick a date when you will quit smoking completely. Tell all of your friends and family about your plan to quit on that date.  Do not try to slowly cut down on the number of cigarettes you are smoking. Pick a quit date and quit smoking completely starting on that day.  Throw away all cigarettes.   Clean and remove all ashtrays from your home, work, and car.  On a card, write down your reasons for quitting. Carry the card with you and read it when you get the urge to smoke.  Cleanse your body of nicotine. Drink enough water and fluids to keep your urine clear or pale yellow. Do this after quitting to flush the nicotine from your body.  Learn to predict your moods. Do not let a bad situation be your excuse to have a cigarette. Some situations in your life might tempt you into wanting a cigarette.  Never have "just one" cigarette. It leads to wanting another and another. Remind yourself of your decision to quit.  Change habits associated with smoking. If you smoked while driving or when feeling stressed, try other activities to replace smoking. Stand up when drinking your coffee. Brush your teeth after eating. Sit in a different chair when you read the paper. Avoid alcohol while trying to quit, and try to drink fewer caffeinated beverages. Alcohol and caffeine may urge you to  smoke.  Avoid foods and drinks that can trigger a desire to smoke, such as sugary or spicy foods and alcohol.  Ask people who smoke not to smoke around you.  Have something planned to do right after eating or having a cup of coffee. For example, plan to take a walk or exercise.  Try a relaxation exercise to calm you down and decrease your stress. Remember, you may be tense and nervous for the first 2 weeks after you quit, but this will pass.  Find new activities to keep your hands busy. Play with a pen, coin, or rubber band. Doodle or draw things on paper.  Brush your teeth right after eating. This will help cut down on the craving for the taste of tobacco after meals. You can also try mouthwash.   Use oral substitutes in place of cigarettes. Try using lemon drops, carrots, cinnamon sticks, or chewing gum. Keep them handy so they are available when you have the urge to smoke.  When you have the urge to smoke, try deep breathing.  Designate your home as a nonsmoking area.  If you are a heavy smoker, ask your health care provider about a prescription for nicotine chewing gum. It can ease your withdrawal from nicotine.  Reward yourself. Set aside the cigarette money you save and buy yourself something nice.  Look for support from others. Join a support group or smoking cessation program. Ask someone at home or at work to help you with your plan   to quit smoking.  Always ask yourself, "Do I need this cigarette or is this just a reflex?" Tell yourself, "Today, I choose not to smoke," or "I do not want to smoke." You are reminding yourself of your decision to quit.  Do not replace cigarette smoking with electronic cigarettes (commonly called e-cigarettes). The safety of e-cigarettes is unknown, and some may contain harmful chemicals.  If you relapse, do not give up! Plan ahead and think about what you will do the next time you get the urge to smoke. HOW WILL I FEEL WHEN I QUIT SMOKING? You  may have symptoms of withdrawal because your body is used to nicotine (the addictive substance in cigarettes). You may crave cigarettes, be irritable, feel very hungry, cough often, get headaches, or have difficulty concentrating. The withdrawal symptoms are only temporary. They are strongest when you first quit but will go away within 10-14 days. When withdrawal symptoms occur, stay in control. Think about your reasons for quitting. Remind yourself that these are signs that your body is healing and getting used to being without cigarettes. Remember that withdrawal symptoms are easier to treat than the major diseases that smoking can cause.  Even after the withdrawal is over, expect periodic urges to smoke. However, these cravings are generally short lived and will go away whether you smoke or not. Do not smoke! WHAT RESOURCES ARE AVAILABLE TO HELP ME QUIT SMOKING? Your health care provider can direct you to community resources or hospitals for support, which may include:  Group support.  Education.  Hypnosis.  Therapy.   This information is not intended to replace advice given to you by your health care provider. Make sure you discuss any questions you have with your health care provider.   Document Released: 10/18/2003 Document Revised: 02/09/2014 Document Reviewed: 07/07/2012 Elsevier Interactive Patient Education 2016 [employer]  

## 2015-09-11 NOTE — Progress Notes (Signed)
Safety precautions to be maintained throughout the outpatient stay will include: orient to surroundings, keep bed in low position, maintain call bell within reach at all times, provide assistance with transfer out of bed and ambulation.  Did not bring pain meds for pill count.  Reminded to bring to all appointments States oxycodone makes her nauseated, gives her a headache.

## 2015-09-13 ENCOUNTER — Telehealth: Payer: Self-pay

## 2015-09-13 NOTE — Telephone Encounter (Signed)
Called patient to inform her that she has labwork ordered and that [facility] [place] [facility] [place] as possible.  Left message with above.

## 2015-11-27 ENCOUNTER — Encounter: Payer: BLUE CROSS/BLUE SHIELD | Admitting: Pain Medicine

## 2016-01-09 ENCOUNTER — Telehealth: Payer: Self-pay

## 2016-01-09 NOTE — Telephone Encounter (Signed)
Patient wants to talk to [facility]. Please call her.

## 2016-01-13 ENCOUNTER — Telehealth: Payer: Self-pay | Admitting: *Deleted

## 2016-01-14 NOTE — Telephone Encounter (Signed)
Left message for patient to call.

## 2016-01-14 NOTE — Progress Notes (Signed)
Patient's Name: Kelly Rollins  MRN: [id number]  Referring Provider: Leonel Ramsay, MD  DOB: 22-Jan-1971  PCP: Kelly Ramsay, MD  DOS: 01/15/2016  Note by: Kelly Rollins. Kelly Arbour, MD  Service setting: Ambulatory outpatient  Specialty: Interventional Pain Management  Location: [facility] (AMB) Pain Management Facility    Patient type: Established   Primary Reason(s) for Visit: Encounter for prescription drug management (Level of risk: moderate) CC: Back Pain (lower) and Hip Pain (both mostly in right)  HPI  [name] is a 45 y.o. year old, female patient, who comes today for a medication management evaluation. She has TOBACCO ABUSE; GERD; Constipation; Pure hypercholesterolemia; Hyperlipidemia; Fatty liver disease; Anxiety; Obesity; Essential hypertriglyceridemia; Long term current use of opiate analgesic; Long term prescription opiate use; Opiate use (25 MME/Day); Encounter for therapeutic drug level monitoring; Encounter for pain management planning; Chronic low back pain (Location of Primary Source of Pain) (Bilateral) (L>R); Lumbar facet syndrome (Location of Primary Source of Pain) (Bilateral) (L>R); Chronic hip pain (Location of Secondary source of pain) (Bilateral) (L>R); Chronic knee pain (Location of Tertiary source of pain) (Bilateral) (L>R); Lumbar spondylosis; Neuropathic pain; Neurogenic pain; Myofascial pain; Acute low back pain; Lumbar transverse process fracture (HCC) (Left L1) (03/11/15); and Chronic pain syndrome on her problem list. Her primarily concern today is the Back Pain (lower) and Hip Pain (both mostly in right)  Pain Assessment: Self-Reported Pain Score: 3 /10             Reported level is compatible with observation.       Pain Type: Chronic pain Pain Location: Back Pain Orientation: Lower Pain Descriptors / Indicators: Aching, Constant, Radiating, Sharp, Burning (sharp pain where the fracture is) Pain Frequency: Constant  [name] was last seen on 09/11/2015  for medication management. During today's appointment we reviewed [name]'s chronic pain status, as well as her outpatient medication regimen. Patient had PT at [facility], but it did not help significantly. She has already had 2 successful diagnostic lumbar facet block with more than 50% relief of the pain. The last one was done on 06/20/2015 and it did provide the patient with 100% relief of the pain for the duration of local anesthetic, followed by a 95% relief of the pain that lasted well into her follow-up appointment on 09/11/2015.  The patient  reports that she does not use drugs. Her body mass index is 32.44 kg/m.  Further details on both, my assessment(s), as well as the proposed treatment plan, please see below.  Controlled Substance Pharmacotherapy Assessment REMS (Risk Evaluation and Mitigation Strategy)  Analgesic:Oxycodone IR one tablet 5 times a day (25 mg/day) MME/day:37.5 mg/day Kelly Slack, RN  01/15/2016  2:08 PM  Sign at close encounter Nursing Pain Medication Assessment:  Safety precautions to be maintained throughout the outpatient stay will include: orient to surroundings, keep bed in low position, maintain call bell within reach at all times, provide assistance with transfer out of bed and ambulation.  Medication Inspection Compliance: [name] did not comply with our request to bring her pills to be counted. She was reminded that bringing the medication bottles, even when empty, is a requirement. Pill Count: No pills available to be counted today. Bottle Appearance: No container available. Did not bring bottle(s) to appointment. Medication: None brought in. Filled Date: N/A Pharmacokinetics: Liberation and absorption (onset of action): WNL Distribution (time to peak effect): WNL Metabolism and excretion (duration of action): WNL         Pharmacodynamics:  Desired effects: Analgesia: [name] reports >50% benefit. Functional ability: Patient reports that  medication allows her to accomplish basic ADLs Clinically meaningful improvement in function (CMIF): Sustained CMIF goals met Perceived effectiveness: Described as relatively effective, allowing for increase in activities of daily living (ADL) Undesirable effects: Side-effects or Adverse reactions: None reported Monitoring: [place] PMP: Online review of the past 40-monthperiod conducted. Compliant with practice rules and regulations List of all UDS test(s) done:  Lab Results  Component Value Date   TOXASSSELUR FINAL 05/29/2015   TOXASSSELUR FINAL 05/01/2015   Last UDS on record: ToxAssure Select 13  Date Value Ref Range Status  05/29/2015 FINAL  Final    Comment:    ==================================================================== TOXASSURE SELECT 13 (MW) ==================================================================== Test                             Result       Flag       Units Drug Present and Declared for Prescription Verification   Hydrocodone                    3507         EXPECTED   ng/mg creat   Hydromorphone                  1175         EXPECTED   ng/mg creat   Dihydrocodeine                 438          EXPECTED   ng/mg creat   Norhydrocodone                 2664         EXPECTED   ng/mg creat    Sources of hydrocodone include scheduled prescription    medications. Hydromorphone, dihydrocodeine and norhydrocodone are    expected metabolites of hydrocodone. Hydromorphone and    dihydrocodeine are also available as scheduled prescription    medications. ==================================================================== Test                      Result    Flag   Units      Ref Range   Creatinine              55               mg/dL      >=20 ==================================================================== Declared Medications:  The flagging and interpretation on this report are based on the  following declared medications.  Unexpected results may arise from   inaccuracies in the declared medications.  **Note: The testing scope of this panel includes these medications:  Hydrocodone (Hydrocodone-Acetaminophen)  **Note: The testing scope of this panel does not include following  reported medications:  Acetaminophen (Hydrocodone-Acetaminophen)  Gabapentin (Neurontin)  Meloxicam (Mobic)  Methocarbamol (Robaxin)  Multivitamin (MVI)  Vitamin C ==================================================================== For clinical consultation, please call ([phone] ====================================================================    UDS interpretation: Compliant          Medication Assessment Form: Reviewed. Patient indicates being compliant with therapy Treatment compliance: Compliant Risk Assessment Profile: Aberrant behavior: See prior evaluations. None observed or detected today Comorbid factors increasing risk of overdose: See prior notes. No additional risks detected today Risk of substance use disorder (SUD): Low Opioid Risk Tool (ORT) Total Score: 0  Interpretation Table:  Score <3 = Low Risk for SUD  Score between 4-7 = Moderate Risk for SUD  Score >8 = High Risk for Opioid Abuse   Risk Mitigation Strategies:  Patient Counseling: Covered Patient-Prescriber Agreement (PPA): Present and active  Notification to other healthcare providers: Done  Pharmacologic Plan: No change in therapy, at this time  Laboratory Chemistry  Inflammation Markers Lab Results  Component Value Date   ESRSEDRATE 23 (H) 04/27/2013   Renal Function Lab Results  Component Value Date   BUN 12 03/18/2013   CREATININE 0.96 03/18/2013   GFRAA >60 03/18/2013   GFRNONAA >60 03/18/2013   Hepatic Function Lab Results  Component Value Date   AST 28 03/18/2013   ALT 37 03/18/2013   ALBUMIN 3.9 03/18/2013   Electrolytes Lab Results  Component Value Date   NA 137 03/18/2013   K 3.9 03/18/2013   CL 106 03/18/2013   CALCIUM 9.4 03/18/2013   MG  1.8 04/27/2013   Pain Modulating Vitamins No results found for: Maralyn Sago [id number], [id number], [id number], 25OHVITD1, 25OHVITD2, 25OHVITD3, VITAMINB12 Coagulation Parameters Lab Results  Component Value Date   PLT 260 03/18/2013   Cardiovascular Lab Results  Component Value Date   HGB 14.2 03/18/2013   HCT 41.1 03/18/2013   Note: Lab results reviewed.  Recent Diagnostic Imaging Review  Ct Cervical Spine Wo Contrast  Result Date: 03/11/2015 CLINICAL DATA:  Pain following fall EXAM: CT CERVICAL, THORACIC, AND LUMBAR SPINE WITHOUT CONTRAST TECHNIQUE: Multidetector CT imaging of the cervical, thoracic and lumbar spine was performed without intravenous contrast. Multiplanar CT image reconstructions were also generated. COMPARISON:  None. FINDINGS: CT CERVICAL SPINE FINDINGS There is no fracture or spondylolisthesis. Prevertebral soft tissues and predental space regions are normal. The disc spaces appear normal. No nerve root edema or effacement. No disc extrusion or stenosis. The visualized brain parenchyma appears normal. The visualized mastoid air cells are clear. Visualized lung apices are clear. CT THORACIC SPINE FINDINGS There is no demonstrable fracture or spondylolisthesis. There is slight mid thoracic dextroscoliosis. There are multiple anterior osteophytes. There is slight disc space narrowing at several levels in the lower thoracic region. There is mild facet hypertrophy at several levels without nerve root edema or effacement. No disc extrusion or stenosis is evident. The visualized lung regions appear clear. No paraspinous lesions are appreciable. CT LUMBAR SPINE FINDINGS There is a nondisplaced fracture of the lateral aspect of the left L1 transverse process. No other fracture is evident. There is no spondylolisthesis. The disc spaces appear normal. No paraspinous lesions are identified. No nerve root edema or effacement. No disc extrusions or stenosis. IMPRESSION: CT cervical spine: No  fracture or spondylolisthesis. No appreciable arthropathy. CT thoracic spine: No fracture or spondylolisthesis. Mild osteoarthritic change at multiple levels. No disc extrusion or stenosis. No nerve root edema or effacement. CT lumbar spine: Nondisplaced fracture L1 transverse process on the left. No other evidence of fracture. No spondylolisthesis. No appreciable disc space narrowing. No disc extrusion or stenosis. Electronically Signed   By: Lowella Grip III M.D.   On: 03/11/2015 09:26   Ct Thoracic Spine Wo Contrast  Result Date: 03/11/2015 CLINICAL DATA:  Pain following fall EXAM: CT CERVICAL, THORACIC, AND LUMBAR SPINE WITHOUT CONTRAST TECHNIQUE: Multidetector CT imaging of the cervical, thoracic and lumbar spine was performed without intravenous contrast. Multiplanar CT image reconstructions were also generated. COMPARISON:  None. FINDINGS: CT CERVICAL SPINE FINDINGS There is no fracture or spondylolisthesis. Prevertebral soft tissues and predental space regions are normal. The disc spaces appear normal. No nerve root edema or  effacement. No disc extrusion or stenosis. The visualized brain parenchyma appears normal. The visualized mastoid air cells are clear. Visualized lung apices are clear. CT THORACIC SPINE FINDINGS There is no demonstrable fracture or spondylolisthesis. There is slight mid thoracic dextroscoliosis. There are multiple anterior osteophytes. There is slight disc space narrowing at several levels in the lower thoracic region. There is mild facet hypertrophy at several levels without nerve root edema or effacement. No disc extrusion or stenosis is evident. The visualized lung regions appear clear. No paraspinous lesions are appreciable. CT LUMBAR SPINE FINDINGS There is a nondisplaced fracture of the lateral aspect of the left L1 transverse process. No other fracture is evident. There is no spondylolisthesis. The disc spaces appear normal. No paraspinous lesions are identified. No nerve  root edema or effacement. No disc extrusions or stenosis. IMPRESSION: CT cervical spine: No fracture or spondylolisthesis. No appreciable arthropathy. CT thoracic spine: No fracture or spondylolisthesis. Mild osteoarthritic change at multiple levels. No disc extrusion or stenosis. No nerve root edema or effacement. CT lumbar spine: Nondisplaced fracture L1 transverse process on the left. No other evidence of fracture. No spondylolisthesis. No appreciable disc space narrowing. No disc extrusion or stenosis. Electronically Signed   By: Lowella Grip III M.D.   On: 03/11/2015 09:26   Ct Lumbar Spine Wo Contrast  Result Date: 03/11/2015 CLINICAL DATA:  Pain following fall EXAM: CT CERVICAL, THORACIC, AND LUMBAR SPINE WITHOUT CONTRAST TECHNIQUE: Multidetector CT imaging of the cervical, thoracic and lumbar spine was performed without intravenous contrast. Multiplanar CT image reconstructions were also generated. COMPARISON:  None. FINDINGS: CT CERVICAL SPINE FINDINGS There is no fracture or spondylolisthesis. Prevertebral soft tissues and predental space regions are normal. The disc spaces appear normal. No nerve root edema or effacement. No disc extrusion or stenosis. The visualized brain parenchyma appears normal. The visualized mastoid air cells are clear. Visualized lung apices are clear. CT THORACIC SPINE FINDINGS There is no demonstrable fracture or spondylolisthesis. There is slight mid thoracic dextroscoliosis. There are multiple anterior osteophytes. There is slight disc space narrowing at several levels in the lower thoracic region. There is mild facet hypertrophy at several levels without nerve root edema or effacement. No disc extrusion or stenosis is evident. The visualized lung regions appear clear. No paraspinous lesions are appreciable. CT LUMBAR SPINE FINDINGS There is a nondisplaced fracture of the lateral aspect of the left L1 transverse process. No other fracture is evident. There is no  spondylolisthesis. The disc spaces appear normal. No paraspinous lesions are identified. No nerve root edema or effacement. No disc extrusions or stenosis. IMPRESSION: CT cervical spine: No fracture or spondylolisthesis. No appreciable arthropathy. CT thoracic spine: No fracture or spondylolisthesis. Mild osteoarthritic change at multiple levels. No disc extrusion or stenosis. No nerve root edema or effacement. CT lumbar spine: Nondisplaced fracture L1 transverse process on the left. No other evidence of fracture. No spondylolisthesis. No appreciable disc space narrowing. No disc extrusion or stenosis. Electronically Signed   By: Lowella Grip III M.D.   On: 03/11/2015 09:26   Note: Imaging results reviewed.          Meds  The patient has a current medication list which includes the following prescription(s): albuterol, albuterol, vitamin c, vitamin d3, magnesium, multivitamin, naproxen sodium, and oxycodone.  Current Outpatient Prescriptions on File Prior to Visit  Medication Sig  . albuterol (PROAIR HFA) 108 (90 Base) MCG/ACT inhaler Inhale into the lungs.  . Ascorbic Acid (VITAMIN C) 100 MG tablet Take 100  mg by mouth daily.  . Multiple Vitamin (MULTIVITAMIN) tablet Take 1 tablet by mouth daily.   No current facility-administered medications on file prior to visit.    ROS  Constitutional: Denies any fever or chills Gastrointestinal: No reported hemesis, hematochezia, vomiting, or acute GI distress Musculoskeletal: Denies any acute onset joint swelling, redness, loss of ROM, or weakness Neurological: No reported episodes of acute onset apraxia, aphasia, dysarthria, agnosia, amnesia, paralysis, loss of coordination, or loss of consciousness  Allergies  Ms. Navarrette is allergic to morphine; penicillins; and tramadol hcl.  [place]  Drug: Ms. Cid  reports that she does not use drugs. Alcohol:  reports that she drinks alcohol. Tobacco:  reports that she has been smoking.  She has never  used smokeless tobacco. Medical:  has a past medical history of ABDOMINAL PAIN OTHER SPECIFIED SITE (02/21/2009); ABDOMINAL PAIN-LUQ (04/17/2009); Anal fissure; Headache(784.0); HEMATOCHEZIA (04/17/2009); Hypertension; IBS (irritable bowel syndrome); Migraine headache (11/06/2008); OVARIAN CYST, RIGHT (02/21/2009); and PALPITATIONS, OCCASIONAL (04/17/2010). Family: family history includes Cancer in her mother; Heart disease in her father; Hyperlipidemia in her father and mother; Hypertension in her father and mother.  Past Surgical History:  Procedure Laterality Date  . BREAST REDUCTION SURGERY     bilateral  . CESAREAN SECTION    . CHOLECYSTECTOMY    . VAGINAL HYSTERECTOMY     Constitutional Exam  General appearance: Well nourished, well developed, and well hydrated. In no apparent acute distress Vitals:   01/15/16 1356  BP: (!) 164/92  Pulse: (!) 117  Resp: 16  Temp: 98.2 F (36.8 C)  TempSrc: Oral  SpO2: 100%  Weight: 189 lb (85.7 kg)  Height: '5\' 4"'  (1.626 m)   BMI Assessment: Estimated body mass index is 32.44 kg/m as calculated from the following:   Height as of this encounter: '5\' 4"'  (1.626 m).   Weight as of this encounter: 189 lb (85.7 kg).  BMI interpretation table: BMI level Category Range association with higher incidence of chronic pain  <18 kg/m2 Underweight   18.5-24.9 kg/m2 Ideal body weight   25-29.9 kg/m2 Overweight Increased incidence by 20%  30-34.9 kg/m2 Obese (Class I) Increased incidence by 68%  35-39.9 kg/m2 Severe obesity (Class II) Increased incidence by 136%  >40 kg/m2 Extreme obesity (Class III) Increased incidence by 254%   BMI Readings from Last 4 Encounters:  01/15/16 32.44 kg/m  09/11/15 34.33 kg/m  06/20/15 32.61 kg/m  05/28/15 32.27 kg/m   Wt Readings from Last 4 Encounters:  01/15/16 189 lb (85.7 kg)  09/11/15 200 lb (90.7 kg)  06/20/15 190 lb (86.2 kg)  05/28/15 188 lb (85.3 kg)  Psych/Mental status: Alert, oriented x 3 (person,  place, & time) Eyes: PERLA Respiratory: No evidence of acute respiratory distress  Cervical Spine Exam  Inspection: No masses, redness, or swelling Alignment: Symmetrical Functional ROM: Unrestricted ROM Stability: No instability detected Muscle strength & Tone: Functionally intact Sensory: Unimpaired Palpation: Non-contributory  Upper Extremity (UE) Exam    Side: Right upper extremity  Side: Left upper extremity  Inspection: No masses, redness, swelling, or asymmetry  Inspection: No masses, redness, swelling, or asymmetry  Functional ROM: Unrestricted ROM          Functional ROM: Unrestricted ROM          Muscle strength & Tone: Functionally intact  Muscle strength & Tone: Functionally intact  Sensory: Unimpaired  Sensory: Unimpaired  Palpation: Non-contributory  Palpation: Non-contributory   Thoracic Spine Exam  Inspection: No masses, redness, or swelling Alignment: Symmetrical  Functional ROM: Unrestricted ROM Stability: No instability detected Sensory: Unimpaired Muscle strength & Tone: Functionally intact Palpation: Non-contributory  Lumbar Spine Exam  Inspection: No masses, redness, or swelling Alignment: Symmetrical Functional ROM: Decreased ROM Stability: No instability detected Muscle strength & Tone: Functionally intact Sensory: Movement-associated pain Palpation: Complains of area being tender to palpation Provocative Tests: Lumbar Hyperextension and rotation test: Positive bilaterally for facet joint pain. Patrick's Maneuver: Negative              Gait & Posture Assessment  Ambulation: Unassisted Gait: Relatively normal for age and body habitus Posture: WNL   Lower Extremity Exam    Side: Right lower extremity  Side: Left lower extremity  Inspection: No masses, redness, swelling, or asymmetry  Inspection: No masses, redness, swelling, or asymmetry  Functional ROM: Unrestricted ROM          Functional ROM: Unrestricted ROM          Muscle strength & Tone:  Functionally intact  Muscle strength & Tone: Functionally intact  Sensory: Unimpaired  Sensory: Unimpaired  Palpation: Non-contributory  Palpation: Non-contributory   Assessment  Primary Diagnosis & Pertinent Problem List: The primary encounter diagnosis was Chronic pain syndrome. Diagnoses of Lumbar facet syndrome (Location of Primary Source of Pain) (Bilateral) (L>R) and Lumbar spondylosis were also pertinent to this visit.  Status Diagnosis   Stable  Stable  Stable 1. Chronic pain syndrome   2. Lumbar facet syndrome (Location of Primary Source of Pain) (Bilateral) (L>R)   3. Lumbar spondylosis      Plan of Care  Pharmacotherapy (Medications Ordered): Meds ordered this encounter  Medications  . oxyCODONE (OXY IR/ROXICODONE) 5 MG immediate release tablet    Sig: Take 1 tablet (5 mg total) by mouth every 6 (six) hours as needed for severe pain.    Dispense:  360 tablet    Refill:  0    Do not place this medication, or any other prescription from our practice, on "Automatic Refill". Patient may have prescription filled one day early if pharmacy is closed on scheduled refill date. Do not fill until: 01/15/16 To last until: 04/14/16   New Prescriptions   No medications on file   Medications administered today: Ms. Goya had no medications administered during this visit. Lab-work, procedure(s), and/or referral(s): Orders Placed This Encounter  Procedures  . Radiofrequency,Lumbar  . LUMBAR FACET(MEDIAL BRANCH NERVE BLOCK) MBNB  . Comprehensive metabolic panel  . C-reactive protein  . Magnesium  . Sedimentation rate  . Vitamin B12  . 25-Hydroxyvitamin D Lcms D2+D3   Imaging and/or referral(s): None  Interventional therapies: Planned, scheduled, and/or pending:   Bilateral lumbar facet radiofrequency ablation under fluoroscopic guidance and IV sedation    Considering:   Diagnostic bilateral hip joint injection under fluoro, with or without sedation. Palliative  bilateral lumbar facet block under fluoro and sedation. Bilateral lumbar facet radiofrequency ablation.    Palliative PRN treatment(s):   Diagnostic bilateral hip joint injection under fluoro, with or without sedation. Bilateral lumbar facet block under fluoroscopic guidance and IV sedation plus left L1 transverse process injection    Provider-requested follow-up: Return in about 3 months (around 04/14/2016) for Med-Mgmt, in addition, procedure.  No future appointments. Primary Care Physician: Kelly Ramsay, MD Location: [facility] Note by: Kelly Rollins. Kelly Rollins, M.D, DABA, DABAPM, DABPM, DABIPP, FIPP Date: 01/16/16; Time: 4:53 PM  Pain Score Disclaimer: We use the NRS-11 scale. This is a self-reported, subjective measurement of pain severity with only  modest accuracy. It is used primarily to identify changes within a particular patient. It must be understood that outpatient pain scales are significantly less accurate that those used for research, where they can be applied under ideal controlled circumstances with minimal exposure to variables. In reality, the score is likely to be a combination of pain intensity and pain affect, where pain affect describes the degree of emotional arousal or changes in action readiness caused by the sensory experience of pain. Factors such as social and work situation, setting, emotional state, anxiety levels, expectation, and prior pain experience may influence pain perception and show large inter-individual differences that may also be affected by time variables.  Patient instructions provided during this appointment: Patient Instructions  Radiofrequency Lesioning Introduction Radiofrequency lesioning is a procedure that is performed to relieve pain. The procedure is often used for back, neck, or arm pain. Radiofrequency lesioning involves the use of a machine that creates radio waves to make heat. During the procedure, the  heat is applied to the nerve that carries the pain signal. The heat damages the nerve and interferes with the pain signal. Pain relief usually starts about 2 weeks after the procedure and lasts for 6 months to 1 year. Tell a health care provider about:  Any allergies you have.  All medicines you are taking, including vitamins, herbs, eye drops, creams, and over-the-counter medicines.  Any problems you or family members have had with anesthetic medicines.  Any blood disorders you have.  Any surgeries you have had.  Any medical conditions you have.  Whether you are pregnant or may be pregnant. What are the risks? Generally, this is a safe procedure. However, problems may occur, including:  Pain or soreness at the injection site.  Infection at the injection site.  Damage to nerves or blood vessels. What happens before the procedure?  Ask your health care provider about:  Changing or stopping your regular medicines. This is especially important if you are taking diabetes medicines or blood thinners.  Taking medicines such as aspirin and ibuprofen. These medicines can thin your blood. Do not take these medicines before your procedure if your health care provider instructs you not to.  Follow instructions from your health care provider about eating or drinking restrictions.  Plan to have someone take you home after the procedure.  If you go home right after the procedure, plan to have someone with you for 24 hours. What happens during the procedure?  You will be given one or more of the following:  A medicine to help you relax (sedative).  A medicine to numb the area (local anesthetic).  You will be awake during the procedure. You will need to be able to talk with the health care provider during the procedure.  With the help of a type of X-ray (fluoroscopy), the health care provider will insert a radiofrequency needle into the area to be treated.  Next, a wire that carries  the radio waves (electrode) will be put through the radiofrequency needle. An electrical pulse will be sent through the electrode to verify the correct nerve. You will feel a tingling sensation, and you may have muscle twitching.  Then, the tissue that is around the needle tip will be heated by an electric current that is passed using the radiofrequency machine. This will numb the nerves.  A bandage (dressing) will be put on the insertion area after the procedure is done. The procedure may vary among health care providers and hospitals. What happens after  the procedure?  Your blood pressure, heart rate, breathing rate, and blood oxygen level will be monitored often until the medicines you were given have worn off.  Return to your normal activities as directed by your health care provider. This information is not intended to replace advice given to you by your health care provider. Make sure you discuss any questions you have with your health care provider. Document Released: 09/17/2010 Document Revised: 06/27/2015 Document Reviewed: 02/26/2014  2017 [employer] Facet Blocks Patient Information  Description: The facets are joints in the spine between the vertebrae.  Like any joints in the body, facets can become irritated and painful.  Arthritis can also effect the facets.  By injecting steroids and local anesthetic in and around these joints, we can temporarily block the nerve supply to them.  Steroids act directly on irritated nerves and tissues to reduce selling and inflammation which often leads to decreased pain.  Facet blocks may be done anywhere along the spine from the neck to the low back depending upon the location of your pain.   After numbing the skin with local anesthetic (like Novocaine), a small needle is passed onto the facet joints under x-ray guidance.  You may experience a sensation of pressure while this is being done.  The entire block usually lasts about 15-25 minutes.    Conditions which may be treated by facet blocks:   Low back/buttock pain  Neck/shoulder pain  Certain types of headaches  Preparation for the injection:  1. Do not eat any solid food or dairy products within 8 hours of your appointment. 2. You may drink clear liquid up to 3 hours before appointment.  Clear liquids include water, black coffee, juice or soda.  No milk or cream please. 3. You may take your regular medication, including pain medications, with a sip of water before your appointment.  Diabetics should hold regular insulin (if taken separately) and take 1/2 normal NPH dose the morning of the procedure.  Carry some sugar containing items with you to your appointment. 4. A driver must accompany you and be prepared to drive you home after your procedure. 5. Bring all your current medications with you. 6. An IV may be inserted and sedation may be given at the discretion of the physician. 7. A blood pressure cuff, EKG and other monitors will often be applied during the procedure.  Some patients may need to have extra oxygen administered for a short period. 8. You will be asked to provide medical information, including your allergies and medications, prior to the procedure.  We must know immediately if you are taking blood thinners (like Coumadin/Warfarin) or if you are allergic to IV iodine contrast (dye).  We must know if you could possible be pregnant.  Possible side-effects:   Bleeding from needle site  Infection (rare, may require surgery)  Nerve injury (rare)  Numbness & tingling (temporary)  Difficulty urinating (rare, temporary)  Spinal headache (a headache worse with upright posture)  Light-headedness (temporary)  Pain at injection site (serveral days)  Decreased blood pressure (rare, temporary)  Weakness in arm/leg (temporary)  Pressure sensation in back/neck (temporary)   Call if you experience:   Fever/chills associated with headache or increased  back/neck pain  Headache worsened by an upright position  New onset, weakness or numbness of an extremity below the injection site  Hives or difficulty breathing (go to the emergency room)  Inflammation or drainage at the injection site(s)  Severe back/neck pain greater than usual  New symptoms which are concerning to you  Please note:  Although the local anesthetic injected can often make your back or neck feel good for several hours after the injection, the pain will likely return. It takes 3-7 days for steroids to work.  You may not notice any pain relief for at least one week.  If effective, we will often do a series of 2-3 injections spaced 3-6 weeks apart to maximally decrease your pain.  After the initial series, you may be a candidate for a more permanent nerve block of the facets.  If you have any questions, please call #[phone]) [place] given to patient for oxycodone x 1 (3 month script)

## 2016-01-15 ENCOUNTER — Encounter: Payer: Self-pay | Admitting: Pain Medicine

## 2016-01-15 ENCOUNTER — Ambulatory Visit: Payer: BLUE CROSS/BLUE SHIELD | Attending: Pain Medicine | Admitting: Pain Medicine

## 2016-01-15 VITALS — BP 164/92 | HR 117 | Temp 98.2°F | Resp 16 | Ht 64.0 in | Wt 189.0 lb

## 2016-01-15 DIAGNOSIS — M545 Low back pain: Secondary | ICD-10-CM | POA: Insufficient documentation

## 2016-01-15 DIAGNOSIS — E669 Obesity, unspecified: Secondary | ICD-10-CM | POA: Diagnosis not present

## 2016-01-15 DIAGNOSIS — M2578 Osteophyte, vertebrae: Secondary | ICD-10-CM | POA: Insufficient documentation

## 2016-01-15 DIAGNOSIS — F419 Anxiety disorder, unspecified: Secondary | ICD-10-CM | POA: Diagnosis not present

## 2016-01-15 DIAGNOSIS — M25552 Pain in left hip: Secondary | ICD-10-CM | POA: Diagnosis present

## 2016-01-15 DIAGNOSIS — Z79891 Long term (current) use of opiate analgesic: Secondary | ICD-10-CM | POA: Insufficient documentation

## 2016-01-15 DIAGNOSIS — M25561 Pain in right knee: Secondary | ICD-10-CM | POA: Diagnosis not present

## 2016-01-15 DIAGNOSIS — K219 Gastro-esophageal reflux disease without esophagitis: Secondary | ICD-10-CM | POA: Insufficient documentation

## 2016-01-15 DIAGNOSIS — E785 Hyperlipidemia, unspecified: Secondary | ICD-10-CM | POA: Insufficient documentation

## 2016-01-15 DIAGNOSIS — G894 Chronic pain syndrome: Secondary | ICD-10-CM | POA: Insufficient documentation

## 2016-01-15 DIAGNOSIS — M47816 Spondylosis without myelopathy or radiculopathy, lumbar region: Secondary | ICD-10-CM | POA: Diagnosis not present

## 2016-01-15 DIAGNOSIS — M25551 Pain in right hip: Secondary | ICD-10-CM | POA: Insufficient documentation

## 2016-01-15 DIAGNOSIS — K760 Fatty (change of) liver, not elsewhere classified: Secondary | ICD-10-CM | POA: Diagnosis not present

## 2016-01-15 DIAGNOSIS — M1288 Other specific arthropathies, not elsewhere classified, other specified site: Secondary | ICD-10-CM

## 2016-01-15 DIAGNOSIS — K5900 Constipation, unspecified: Secondary | ICD-10-CM | POA: Diagnosis not present

## 2016-01-15 DIAGNOSIS — E781 Pure hyperglyceridemia: Secondary | ICD-10-CM | POA: Insufficient documentation

## 2016-01-15 DIAGNOSIS — N83201 Unspecified ovarian cyst, right side: Secondary | ICD-10-CM | POA: Diagnosis not present

## 2016-01-15 DIAGNOSIS — K589 Irritable bowel syndrome without diarrhea: Secondary | ICD-10-CM | POA: Insufficient documentation

## 2016-01-15 DIAGNOSIS — M549 Dorsalgia, unspecified: Secondary | ICD-10-CM | POA: Diagnosis present

## 2016-01-15 DIAGNOSIS — F17200 Nicotine dependence, unspecified, uncomplicated: Secondary | ICD-10-CM | POA: Diagnosis not present

## 2016-01-15 DIAGNOSIS — E7800 Pure hypercholesterolemia, unspecified: Secondary | ICD-10-CM | POA: Diagnosis not present

## 2016-01-15 DIAGNOSIS — M25562 Pain in left knee: Secondary | ICD-10-CM | POA: Insufficient documentation

## 2016-01-15 MED ORDER — OXYCODONE HCL 5 MG PO TABS: 5.0000 mg | ORAL_TABLET | Freq: Four times a day (QID) | ORAL | 0 refills | Status: DC | PRN

## 2016-01-15 NOTE — Patient Instructions (Signed)
Radiofrequency Lesioning Introduction Radiofrequency lesioning is a procedure that is performed to relieve pain. The procedure is often used for back, neck, or arm pain. Radiofrequency lesioning involves the use of a machine that creates radio waves to make heat. During the procedure, the heat is applied to the nerve that carries the pain signal. The heat damages the nerve and interferes with the pain signal. Pain relief usually starts about 2 weeks after the procedure and lasts for 6 months to 1 year. Tell a health care provider about:  Any allergies you have.  All medicines you are taking, including vitamins, herbs, eye drops, creams, and over-the-counter medicines.  Any problems you or family members have had with anesthetic medicines.  Any blood disorders you have.  Any surgeries you have had.  Any medical conditions you have.  Whether you are pregnant or may be pregnant. What are the risks? Generally, this is a safe procedure. However, problems may occur, including:  Pain or soreness at the injection site.  Infection at the injection site.  Damage to nerves or blood vessels. What happens before the procedure?  Ask your health care provider about:  Changing or stopping your regular medicines. This is especially important if you are taking diabetes medicines or blood thinners.  Taking medicines such as aspirin and ibuprofen. These medicines can thin your blood. Do not take these medicines before your procedure if your health care provider instructs you not to.  Follow instructions from your health care provider about eating or drinking restrictions.  Plan to have someone take you home after the procedure.  If you go home right after the procedure, plan to have someone with you for 24 hours. What happens during the procedure?  You will be given one or more of the following:  A medicine to help you relax (sedative).  A medicine to numb the area (local anesthetic).  You  will be awake during the procedure. You will need to be able to talk with the health care provider during the procedure.  With the help of a type of X-ray (fluoroscopy), the health care provider will insert a radiofrequency needle into the area to be treated.  Next, a wire that carries the radio waves (electrode) will be put through the radiofrequency needle. An electrical pulse will be sent through the electrode to verify the correct nerve. You will feel a tingling sensation, and you may have muscle twitching.  Then, the tissue that is around the needle tip will be heated by an electric current that is passed using the radiofrequency machine. This will numb the nerves.  A bandage (dressing) will be put on the insertion area after the procedure is done. The procedure may vary among health care providers and hospitals. What happens after the procedure?  Your blood pressure, heart rate, breathing rate, and blood oxygen level will be monitored often until the medicines you were given have worn off.  Return to your normal activities as directed by your health care provider. This information is not intended to replace advice given to you by your health care provider. Make sure you discuss any questions you have with your health care provider. Document Released: 09/17/2010 Document Revised: 06/27/2015 Document Reviewed: 02/26/2014  2017 [employer] Facet Blocks Patient Information  Description: The facets are joints in the spine between the vertebrae.  Like any joints in the body, facets can become irritated and painful.  Arthritis can also effect the facets.  By injecting steroids and local anesthetic in and  around these joints, we can temporarily block the nerve supply to them.  Steroids act directly on irritated nerves and tissues to reduce selling and inflammation which often leads to decreased pain.  Facet blocks may be done anywhere along the spine from the neck to the low back depending upon the  location of your pain.   After numbing the skin with local anesthetic (like Novocaine), a small needle is passed onto the facet joints under x-ray guidance.  You may experience a sensation of pressure while this is being done.  The entire block usually lasts about 15-25 minutes.   Conditions which may be treated by facet blocks:   Low back/buttock pain  Neck/shoulder pain  Certain types of headaches  Preparation for the injection:  1. Do not eat any solid food or dairy products within 8 hours of your appointment. 2. You may drink clear liquid up to 3 hours before appointment.  Clear liquids include water, black coffee, juice or soda.  No milk or cream please. 3. You may take your regular medication, including pain medications, with a sip of water before your appointment.  Diabetics should hold regular insulin (if taken separately) and take 1/2 normal NPH dose the morning of the procedure.  Carry some sugar containing items with you to your appointment. 4. A driver must accompany you and be prepared to drive you home after your procedure. 5. Bring all your current medications with you. 6. An IV may be inserted and sedation may be given at the discretion of the physician. 7. A blood pressure cuff, EKG and other monitors will often be applied during the procedure.  Some patients may need to have extra oxygen administered for a short period. 8. You will be asked to provide medical information, including your allergies and medications, prior to the procedure.  We must know immediately if you are taking blood thinners (like Coumadin/Warfarin) or if you are allergic to IV iodine contrast (dye).  We must know if you could possible be pregnant.  Possible side-effects:   Bleeding from needle site  Infection (rare, may require surgery)  Nerve injury (rare)  Numbness & tingling (temporary)  Difficulty urinating (rare, temporary)  Spinal headache (a headache worse with upright  posture)  Light-headedness (temporary)  Pain at injection site (serveral days)  Decreased blood pressure (rare, temporary)  Weakness in arm/leg (temporary)  Pressure sensation in back/neck (temporary)   Call if you experience:   Fever/chills associated with headache or increased back/neck pain  Headache worsened by an upright position  New onset, weakness or numbness of an extremity below the injection site  Hives or difficulty breathing (go to the emergency room)  Inflammation or drainage at the injection site(s)  Severe back/neck pain greater than usual  New symptoms which are concerning to you  Please note:  Although the local anesthetic injected can often make your back or neck feel good for several hours after the injection, the pain will likely return. It takes 3-7 days for steroids to work.  You may not notice any pain relief for at least one week.  If effective, we will often do a series of 2-3 injections spaced 3-6 weeks apart to maximally decrease your pain.  After the initial series, you may be a candidate for a more permanent nerve block of the facets.  If you have any questions, please call #[phone]) [place] given to patient for oxycodone x 1 (3 month script)

## 2016-01-15 NOTE — Progress Notes (Signed)
Nursing Pain Medication Assessment:  Safety precautions to be maintained throughout the outpatient stay will include: orient to surroundings, keep bed in low position, maintain call bell within reach at all times, provide assistance with transfer out of bed and ambulation.  Medication Inspection Compliance: Ms. Pennycuff did not comply with our request to bring her pills to be counted. She was reminded that bringing the medication bottles, even when empty, is a requirement. Pill Count: No pills available to be counted today. Bottle Appearance: No container available. Did not bring bottle(s) to appointment. Medication: None brought in. Filled Date: N/A

## 2016-02-04 DIAGNOSIS — F32A Depression, unspecified: Secondary | ICD-10-CM | POA: Insufficient documentation

## 2016-02-04 DIAGNOSIS — E1159 Type 2 diabetes mellitus with other circulatory complications: Secondary | ICD-10-CM | POA: Insufficient documentation

## 2016-02-04 DIAGNOSIS — E119 Type 2 diabetes mellitus without complications: Secondary | ICD-10-CM | POA: Insufficient documentation

## 2016-02-04 DIAGNOSIS — F329 Major depressive disorder, single episode, unspecified: Secondary | ICD-10-CM | POA: Insufficient documentation

## 2016-02-04 DIAGNOSIS — I152 Hypertension secondary to endocrine disorders: Secondary | ICD-10-CM | POA: Insufficient documentation

## 2016-02-04 DIAGNOSIS — K760 Fatty (change of) liver, not elsewhere classified: Secondary | ICD-10-CM | POA: Insufficient documentation

## 2016-02-04 DIAGNOSIS — I10 Essential (primary) hypertension: Secondary | ICD-10-CM | POA: Insufficient documentation

## 2016-02-20 ENCOUNTER — Encounter: Payer: BLUE CROSS/BLUE SHIELD | Admitting: Pain Medicine

## 2016-03-23 ENCOUNTER — Encounter: Payer: Self-pay | Admitting: Infectious Diseases

## 2016-03-23 ENCOUNTER — Encounter: Payer: Self-pay | Admitting: Pain Medicine

## 2016-04-02 ENCOUNTER — Encounter: Payer: Self-pay | Admitting: Pain Medicine

## 2016-04-03 ENCOUNTER — Encounter: Payer: Self-pay | Admitting: Pain Medicine

## 2016-04-03 ENCOUNTER — Telehealth: Payer: Self-pay | Admitting: *Deleted

## 2016-04-28 ENCOUNTER — Ambulatory Visit: Payer: BLUE CROSS/BLUE SHIELD | Attending: Pain Medicine | Admitting: Pain Medicine

## 2016-04-28 ENCOUNTER — Encounter: Payer: Self-pay | Admitting: Pain Medicine

## 2016-04-28 ENCOUNTER — Other Ambulatory Visit
Admission: RE | Admit: 2016-04-28 | Discharge: 2016-04-28 | Disposition: A | Discharge status: Home / Self Care | Payer: BLUE CROSS/BLUE SHIELD | Source: Ambulatory Visit | Attending: Pain Medicine | Admitting: Pain Medicine

## 2016-04-28 VITALS — BP 140/85 | HR 91 | Temp 98.1°F | Resp 16 | Ht 64.0 in | Wt 182.0 lb

## 2016-04-28 DIAGNOSIS — M47816 Spondylosis without myelopathy or radiculopathy, lumbar region: Secondary | ICD-10-CM

## 2016-04-28 DIAGNOSIS — F1190 Opioid use, unspecified, uncomplicated: Secondary | ICD-10-CM

## 2016-04-28 DIAGNOSIS — M25559 Pain in unspecified hip: Secondary | ICD-10-CM

## 2016-04-28 DIAGNOSIS — M25551 Pain in right hip: Secondary | ICD-10-CM | POA: Diagnosis present

## 2016-04-28 DIAGNOSIS — M545 Low back pain, unspecified: Secondary | ICD-10-CM

## 2016-04-28 DIAGNOSIS — M25562 Pain in left knee: Secondary | ICD-10-CM | POA: Insufficient documentation

## 2016-04-28 DIAGNOSIS — Z87891 Personal history of nicotine dependence: Secondary | ICD-10-CM | POA: Diagnosis not present

## 2016-04-28 DIAGNOSIS — Z79891 Long term (current) use of opiate analgesic: Secondary | ICD-10-CM | POA: Diagnosis not present

## 2016-04-28 DIAGNOSIS — E669 Obesity, unspecified: Secondary | ICD-10-CM | POA: Diagnosis not present

## 2016-04-28 DIAGNOSIS — E7800 Pure hypercholesterolemia, unspecified: Secondary | ICD-10-CM | POA: Insufficient documentation

## 2016-04-28 DIAGNOSIS — G894 Chronic pain syndrome: Secondary | ICD-10-CM | POA: Insufficient documentation

## 2016-04-28 DIAGNOSIS — M25561 Pain in right knee: Secondary | ICD-10-CM | POA: Diagnosis not present

## 2016-04-28 DIAGNOSIS — Z6831 Body mass index (BMI) 31.0-31.9, adult: Secondary | ICD-10-CM | POA: Insufficient documentation

## 2016-04-28 DIAGNOSIS — M1288 Other specific arthropathies, not elsewhere classified, other specified site: Secondary | ICD-10-CM | POA: Diagnosis not present

## 2016-04-28 DIAGNOSIS — F419 Anxiety disorder, unspecified: Secondary | ICD-10-CM | POA: Insufficient documentation

## 2016-04-28 DIAGNOSIS — F329 Major depressive disorder, single episode, unspecified: Secondary | ICD-10-CM | POA: Insufficient documentation

## 2016-04-28 DIAGNOSIS — K589 Irritable bowel syndrome without diarrhea: Secondary | ICD-10-CM | POA: Insufficient documentation

## 2016-04-28 DIAGNOSIS — E119 Type 2 diabetes mellitus without complications: Secondary | ICD-10-CM | POA: Diagnosis not present

## 2016-04-28 DIAGNOSIS — I10 Essential (primary) hypertension: Secondary | ICD-10-CM | POA: Insufficient documentation

## 2016-04-28 DIAGNOSIS — M25552 Pain in left hip: Secondary | ICD-10-CM | POA: Diagnosis present

## 2016-04-28 DIAGNOSIS — G8929 Other chronic pain: Secondary | ICD-10-CM | POA: Insufficient documentation

## 2016-04-28 LAB — COMPREHENSIVE METABOLIC PANEL
ALBUMIN: 4 g/dL (ref 3.5–5.0)
ALT: 57 U/L — AB (ref 14–54)
AST: 47 U/L — AB (ref 15–41)
Alkaline Phosphatase: 78 U/L (ref 38–126)
Anion gap: 11 (ref 5–15)
BUN: 16 mg/dL (ref 6–20)
CHLORIDE: 99 mmol/L — AB (ref 101–111)
CO2: 25 mmol/L (ref 22–32)
CREATININE: 0.65 mg/dL (ref 0.44–1.00)
Calcium: 9.4 mg/dL (ref 8.9–10.3)
GFR calc Af Amer: >60 mL/min (ref >60)
GFR calc non Af Amer: >60 mL/min (ref >60)
GLUCOSE: 185 mg/dL — AB (ref 65–99)
Potassium: 4.2 mmol/L (ref 3.5–5.1)
Sodium: 135 mmol/L (ref 135–145)
Total Bilirubin: 0.7 mg/dL (ref 0.3–1.2)
Total Protein: 7.6 g/dL (ref 6.5–8.1)

## 2016-04-28 LAB — VITAMIN B12: VITAMIN B 12: 638 pg/mL (ref 180–914)

## 2016-04-28 LAB — C-REACTIVE PROTEIN: CRP: 2.5 mg/dL — AB (ref <1.0)

## 2016-04-28 LAB — SEDIMENTATION RATE: SED RATE: 40 mm/h — AB (ref 0–20)

## 2016-04-28 LAB — MAGNESIUM: MAGNESIUM: 2.1 mg/dL (ref 1.7–2.4)

## 2016-04-28 MED ORDER — OXYCODONE HCL 5 MG PO TABS: 5.0000 mg | ORAL_TABLET | Freq: Four times a day (QID) | ORAL | 0 refills | Status: DC | PRN

## 2016-04-28 NOTE — Progress Notes (Signed)
Nursing Pain Medication Assessment:  Safety precautions to be maintained throughout the outpatient stay will include: orient to surroundings, keep bed in low position, maintain call bell within reach at all times, provide assistance with transfer out of bed and ambulation.  Medication Inspection Compliance: Pill count conducted under aseptic conditions, in front of the patient. Neither the pills nor the bottle was removed from the patient's sight at any time. Once count was completed pills were immediately returned to the patient in their original bottle.  Medication: See above Pill/Patch Count: 0 of 360 pills remain Bottle Appearance: Standard pharmacy container. Clearly labeled. Filled Date: 58 / 13 / 2017 Last Medication intake:  Ran out of medicine more than 48 hours ago

## 2016-04-28 NOTE — Patient Instructions (Addendum)
Instructed to get labwork drawn today.If you do not get the labwork drawn, there will not be any refills given.  You were given 3 scripts for oxycodone today

## 2016-04-28 NOTE — Progress Notes (Signed)
Patient's Name: Kelly Rollins  MRN: [id number]  Referring Provider: Leonel Ramsay, MD  DOB: Jun 18, 1970  PCP: Leonel Ramsay, MD  DOS: 04/28/2016  Note by: Kathlen Brunswick. Dossie Arbour, MD  Service setting: Ambulatory outpatient  Specialty: Interventional Pain Management  Location: [facility] (AMB) Pain Management Facility    Patient type: Established   Primary Reason(s) for Visit: Encounter for prescription drug management (Level of risk: moderate) CC: Back Pain (lower) and Hip Pain (bilateral)  HPI  Kelly Rollins is a 46 y.o. year old, female patient, who comes today for a medication management evaluation. She has TOBACCO ABUSE; GERD; Constipation; Pure hypercholesterolemia; Hyperlipidemia; Fatty liver disease; Anxiety; Obesity; Essential hypertriglyceridemia; Long term current use of opiate analgesic; Long term prescription opiate use; Opiate use (25 MME/Day); Encounter for therapeutic drug level monitoring; Encounter for pain management planning; Chronic low back pain (Location of Primary Source of Pain) (Bilateral) (L>R); Lumbar facet syndrome (Location of Primary Source of Pain) (Bilateral) (L>R); Chronic hip pain (Location of Secondary source of pain) (Bilateral) (L>R); Chronic knee pain (Location of Tertiary source of pain) (Bilateral) (L>R); Lumbar spondylosis; Neuropathic pain; Neurogenic pain; Myofascial pain; Acute low back pain; Lumbar transverse process fracture (HCC) (Left L1) (03/11/15); Chronic pain syndrome; Depression; Diabetes mellitus type 2, uncomplicated ([place]); Fatty liver; and HTN (hypertension), benign on her problem list. Her primarily concern today is the Back Pain (lower) and Hip Pain (bilateral)  Pain Assessment: Self-Reported Pain Score: 3 /10             Reported level is compatible with observation.       Pain Type: Chronic pain Pain Location: Back (hips) Pain Orientation: Lower, Left, Right Pain Descriptors / Indicators: Dull, Aching, Constant (hip pain is sharp and  painfrul ) Pain Frequency: Constant  Kelly Rollins was last scheduled for an appointment on 01/15/2016 for medication management. During today's appointment we reviewed [name]'s chronic pain status, as well as her outpatient medication regimen.  The patient  reports that she does not use drugs. Her body mass index is 31.24 kg/m.  Further details on both, my assessment(s), as well as the proposed treatment plan, please see below.  Controlled Substance Pharmacotherapy Assessment REMS (Risk Evaluation and Mitigation Strategy)  Analgesic:Oxycodone IR one tablet 5 times a day (25 mg/day) MME/day:37.5 mg/day  Kelly Billow, RN  04/28/2016  8:57 AM  Sign at close encounter Nursing Pain Medication Assessment:  Safety precautions to be maintained throughout the outpatient stay will include: orient to surroundings, keep bed in low position, maintain call bell within reach at all times, provide assistance with transfer out of bed and ambulation.  Medication Inspection Compliance: Pill count conducted under aseptic conditions, in front of the patient. Neither the pills nor the bottle was removed from the patient's sight at any time. Once count was completed pills were immediately returned to the patient in their original bottle.  Medication: See above Pill/Patch Count: 0 of 360 pills remain Bottle Appearance: Standard pharmacy container. Clearly labeled. Filled Date: 11 / 13 / 2017 Last Medication intake:  Ran out of medicine more than 48 hours ago   Pharmacokinetics: Liberation and absorption (onset of action): WNL Distribution (time to peak effect): WNL Metabolism and excretion (duration of action): WNL         Pharmacodynamics: Desired effects: Analgesia: [name] reports >50% benefit. Functional ability: Patient reports that medication allows her to accomplish basic ADLs Clinically meaningful improvement in function (CMIF): Sustained CMIF goals met Perceived effectiveness:  Described as  relatively effective, allowing for increase in activities of daily living (ADL) Undesirable effects: Side-effects or Adverse reactions: None reported Monitoring: [place] PMP: Online review of the past 83-monthperiod conducted. Compliant with practice rules and regulations List of all UDS test(s) done:  Lab Results  Component Value Date   TOXASSSELUR FINAL 05/29/2015   TOXASSSELUR FINAL 05/01/2015   Last UDS on record: ToxAssure Select 13  Date Value Ref Range Status  05/29/2015 FINAL  Final    Comment:    ==================================================================== TOXASSURE SELECT 13 (MW) ==================================================================== Test                             Result       Flag       Units Drug Present and Declared for Prescription Verification   Hydrocodone                    3507         EXPECTED   ng/mg creat   Hydromorphone                  1175         EXPECTED   ng/mg creat   Dihydrocodeine                 438          EXPECTED   ng/mg creat   Norhydrocodone                 2664         EXPECTED   ng/mg creat    Sources of hydrocodone include scheduled prescription    medications. Hydromorphone, dihydrocodeine and norhydrocodone are    expected metabolites of hydrocodone. Hydromorphone and    dihydrocodeine are also available as scheduled prescription    medications. ==================================================================== Test                      Result    Flag   Units      Ref Range   Creatinine              55               mg/dL      >=20 ==================================================================== Declared Medications:  The flagging and interpretation on this report are based on the  following declared medications.  Unexpected results may arise from  inaccuracies in the declared medications.  **Note: The testing scope of this panel includes these medications:  Hydrocodone  (Hydrocodone-Acetaminophen)  **Note: The testing scope of this panel does not include following  reported medications:  Acetaminophen (Hydrocodone-Acetaminophen)  Gabapentin (Neurontin)  Meloxicam (Mobic)  Methocarbamol (Robaxin)  Multivitamin (MVI)  Vitamin C ==================================================================== For clinical consultation, please call ([phone] ====================================================================    UDS interpretation: Compliant          Medication Assessment Form: Reviewed. Patient indicates being compliant with therapy Treatment compliance: Compliant Risk Assessment Profile: Aberrant behavior: See prior evaluations. None observed or detected today Comorbid factors increasing risk of overdose: See prior notes. No additional risks detected today Risk of substance use disorder (SUD): Low Opioid Risk Tool (ORT) Total Score: 0  Interpretation Table:  Score <3 = Low Risk for SUD  Score between 4-7 = Moderate Risk for SUD  Score >8 = High Risk for Opioid Abuse   Risk Mitigation Strategies:  Patient Counseling: Covered Patient-Prescriber Agreement (PPA): Present and active  Notification  to other healthcare providers: Done  Pharmacologic Plan: No change in therapy, at this time  Laboratory Chemistry  Inflammation Markers Lab Results  Component Value Date   ESRSEDRATE 40 (H) 04/28/2016   (CRP: Acute Phase) (ESR: Chronic Phase) Renal Function Markers Lab Results  Component Value Date   BUN 16 04/28/2016   CREATININE 0.65 04/28/2016   GFRAA >60 04/28/2016   GFRNONAA >60 04/28/2016   Hepatic Function Markers Lab Results  Component Value Date   AST 47 (H) 04/28/2016   ALT 57 (H) 04/28/2016   ALBUMIN 4.0 04/28/2016   ALKPHOS 78 04/28/2016   Electrolytes Lab Results  Component Value Date   NA 135 04/28/2016   K 4.2 04/28/2016   CL 99 (L) 04/28/2016   CALCIUM 9.4 04/28/2016   MG 2.1 04/28/2016   Neuropathy  Markers Lab Results  Component Value Date   VITAMINB12 638 04/28/2016   Bone Pathology Markers Lab Results  Component Value Date   ALKPHOS 78 04/28/2016   CALCIUM 9.4 04/28/2016   Coagulation Parameters Lab Results  Component Value Date   PLT 260 03/18/2013   Cardiovascular Markers Lab Results  Component Value Date   HGB 14.2 03/18/2013   HCT 41.1 03/18/2013   Note: Lab results reviewed.  Recent Diagnostic Imaging Review  Ct Cervical Spine Wo Contrast  Result Date: 03/11/2015 CLINICAL DATA:  Pain following fall EXAM: CT CERVICAL, THORACIC, AND LUMBAR SPINE WITHOUT CONTRAST TECHNIQUE: Multidetector CT imaging of the cervical, thoracic and lumbar spine was performed without intravenous contrast. Multiplanar CT image reconstructions were also generated. COMPARISON:  None. FINDINGS: CT CERVICAL SPINE FINDINGS There is no fracture or spondylolisthesis. Prevertebral soft tissues and predental space regions are normal. The disc spaces appear normal. No nerve root edema or effacement. No disc extrusion or stenosis. The visualized brain parenchyma appears normal. The visualized mastoid air cells are clear. Visualized lung apices are clear. CT THORACIC SPINE FINDINGS There is no demonstrable fracture or spondylolisthesis. There is slight mid thoracic dextroscoliosis. There are multiple anterior osteophytes. There is slight disc space narrowing at several levels in the lower thoracic region. There is mild facet hypertrophy at several levels without nerve root edema or effacement. No disc extrusion or stenosis is evident. The visualized lung regions appear clear. No paraspinous lesions are appreciable. CT LUMBAR SPINE FINDINGS There is a nondisplaced fracture of the lateral aspect of the left L1 transverse process. No other fracture is evident. There is no spondylolisthesis. The disc spaces appear normal. No paraspinous lesions are identified. No nerve root edema or effacement. No disc extrusions or  stenosis. IMPRESSION: CT cervical spine: No fracture or spondylolisthesis. No appreciable arthropathy. CT thoracic spine: No fracture or spondylolisthesis. Mild osteoarthritic change at multiple levels. No disc extrusion or stenosis. No nerve root edema or effacement. CT lumbar spine: Nondisplaced fracture L1 transverse process on the left. No other evidence of fracture. No spondylolisthesis. No appreciable disc space narrowing. No disc extrusion or stenosis. Electronically Signed   By: Lowella Grip III M.D.   On: 03/11/2015 09:26   Ct Thoracic Spine Wo Contrast  Result Date: 03/11/2015 CLINICAL DATA:  Pain following fall EXAM: CT CERVICAL, THORACIC, AND LUMBAR SPINE WITHOUT CONTRAST TECHNIQUE: Multidetector CT imaging of the cervical, thoracic and lumbar spine was performed without intravenous contrast. Multiplanar CT image reconstructions were also generated. COMPARISON:  None. FINDINGS: CT CERVICAL SPINE FINDINGS There is no fracture or spondylolisthesis. Prevertebral soft tissues and predental space regions are normal. The disc spaces appear normal. No  nerve root edema or effacement. No disc extrusion or stenosis. The visualized brain parenchyma appears normal. The visualized mastoid air cells are clear. Visualized lung apices are clear. CT THORACIC SPINE FINDINGS There is no demonstrable fracture or spondylolisthesis. There is slight mid thoracic dextroscoliosis. There are multiple anterior osteophytes. There is slight disc space narrowing at several levels in the lower thoracic region. There is mild facet hypertrophy at several levels without nerve root edema or effacement. No disc extrusion or stenosis is evident. The visualized lung regions appear clear. No paraspinous lesions are appreciable. CT LUMBAR SPINE FINDINGS There is a nondisplaced fracture of the lateral aspect of the left L1 transverse process. No other fracture is evident. There is no spondylolisthesis. The disc spaces appear normal. No  paraspinous lesions are identified. No nerve root edema or effacement. No disc extrusions or stenosis. IMPRESSION: CT cervical spine: No fracture or spondylolisthesis. No appreciable arthropathy. CT thoracic spine: No fracture or spondylolisthesis. Mild osteoarthritic change at multiple levels. No disc extrusion or stenosis. No nerve root edema or effacement. CT lumbar spine: Nondisplaced fracture L1 transverse process on the left. No other evidence of fracture. No spondylolisthesis. No appreciable disc space narrowing. No disc extrusion or stenosis. Electronically Signed   By: Lowella Grip III M.D.   On: 03/11/2015 09:26   Ct Lumbar Spine Wo Contrast  Result Date: 03/11/2015 CLINICAL DATA:  Pain following fall EXAM: CT CERVICAL, THORACIC, AND LUMBAR SPINE WITHOUT CONTRAST TECHNIQUE: Multidetector CT imaging of the cervical, thoracic and lumbar spine was performed without intravenous contrast. Multiplanar CT image reconstructions were also generated. COMPARISON:  None. FINDINGS: CT CERVICAL SPINE FINDINGS There is no fracture or spondylolisthesis. Prevertebral soft tissues and predental space regions are normal. The disc spaces appear normal. No nerve root edema or effacement. No disc extrusion or stenosis. The visualized brain parenchyma appears normal. The visualized mastoid air cells are clear. Visualized lung apices are clear. CT THORACIC SPINE FINDINGS There is no demonstrable fracture or spondylolisthesis. There is slight mid thoracic dextroscoliosis. There are multiple anterior osteophytes. There is slight disc space narrowing at several levels in the lower thoracic region. There is mild facet hypertrophy at several levels without nerve root edema or effacement. No disc extrusion or stenosis is evident. The visualized lung regions appear clear. No paraspinous lesions are appreciable. CT LUMBAR SPINE FINDINGS There is a nondisplaced fracture of the lateral aspect of the left L1 transverse process. No  other fracture is evident. There is no spondylolisthesis. The disc spaces appear normal. No paraspinous lesions are identified. No nerve root edema or effacement. No disc extrusions or stenosis. IMPRESSION: CT cervical spine: No fracture or spondylolisthesis. No appreciable arthropathy. CT thoracic spine: No fracture or spondylolisthesis. Mild osteoarthritic change at multiple levels. No disc extrusion or stenosis. No nerve root edema or effacement. CT lumbar spine: Nondisplaced fracture L1 transverse process on the left. No other evidence of fracture. No spondylolisthesis. No appreciable disc space narrowing. No disc extrusion or stenosis. Electronically Signed   By: Lowella Grip III M.D.   On: 03/11/2015 09:26   Note: Imaging results reviewed.          Meds  The patient has a current medication list which includes the following prescription(s): albuterol, vitamin c, vitamin d3, magnesium, multivitamin, naproxen sodium, oxycodone, oxycodone, and oxycodone.  Current Outpatient Prescriptions on File Prior to Visit  Medication Sig  . albuterol (PROAIR HFA) 108 (90 Base) MCG/ACT inhaler Inhale 2 puffs into the lungs every 6 (six) hours  as needed.   . Ascorbic Acid (VITAMIN C) 100 MG tablet Take 100 mg by mouth daily.  . Cholecalciferol (VITAMIN D3) 1000 units CAPS Take by mouth daily.  . Magnesium 250 MG TABS Take by mouth at bedtime.  . Multiple Vitamin (MULTIVITAMIN) tablet Take 1 tablet by mouth daily.  . naproxen sodium (ANAPROX) 220 MG tablet Take 220 mg by mouth as needed.   No current facility-administered medications on file prior to visit.    ROS  Constitutional: Denies any fever or chills Gastrointestinal: No reported hemesis, hematochezia, vomiting, or acute GI distress Musculoskeletal: Denies any acute onset joint swelling, redness, loss of ROM, or weakness Neurological: No reported episodes of acute onset apraxia, aphasia, dysarthria, agnosia, amnesia, paralysis, loss of  coordination, or loss of consciousness  Allergies  Ms. Hosman is allergic to morphine; penicillins; and tramadol hcl.  [place]  Drug: Ms. Klausner  reports that she does not use drugs. Alcohol:  reports that she drinks alcohol. Tobacco:  reports that she has quit smoking. She has never used smokeless tobacco. Medical:  has a past medical history of ABDOMINAL PAIN OTHER SPECIFIED SITE (02/21/2009); ABDOMINAL PAIN-LUQ (04/17/2009); Anal fissure; Headache(784.0); HEMATOCHEZIA (04/17/2009); Hypertension; IBS (irritable bowel syndrome); Migraine headache (11/06/2008); OVARIAN CYST, RIGHT (02/21/2009); and PALPITATIONS, OCCASIONAL (04/17/2010). Family: family history includes Cancer in her mother; Heart disease in her father; Hyperlipidemia in her father and mother; Hypertension in her father and mother.  Past Surgical History:  Procedure Laterality Date  . BREAST REDUCTION SURGERY     bilateral  . CESAREAN SECTION    . CHOLECYSTECTOMY    . VAGINAL HYSTERECTOMY     Constitutional Exam  General appearance: Well nourished, well developed, and well hydrated. In no apparent acute distress Vitals:   04/28/16 0854  BP: 140/85  Pulse: 91  Resp: 16  Temp: 98.1 F (36.7 C)  TempSrc: Oral  SpO2: 99%  Weight: 182 lb (82.6 kg)  Height: _0  (1.626 m)   BMI Assessment: Estimated body mass index is 31.24 kg/m as calculated from the following:   Height as of this encounter: _1  (1.626 m).   Weight as of this encounter: 182 lb (82.6 kg).  BMI interpretation table: BMI level Category Range association with higher incidence of chronic pain  <18 kg/m2 Underweight   18.5-24.9 kg/m2 Ideal body weight   25-29.9 kg/m2 Overweight Increased incidence by 20%  30-34.9 kg/m2 Obese (Class I) Increased incidence by 68%  35-39.9 kg/m2 Severe obesity (Class II) Increased incidence by 136%  >40 kg/m2 Extreme obesity (Class III) Increased incidence by 254%   BMI Readings from Last 4 Encounters:  04/28/16 31.24  kg/m  01/15/16 32.44 kg/m  09/11/15 34.33 kg/m  06/20/15 32.61 kg/m   Wt Readings from Last 4 Encounters:  04/28/16 182 lb (82.6 kg)  01/15/16 189 lb (85.7 kg)  09/11/15 200 lb (90.7 kg)  06/20/15 190 lb (86.2 kg)  Psych/Mental status: Alert, oriented x 3 (person, place, & time)       Eyes: PERLA Respiratory: No evidence of acute respiratory distress  Cervical Spine Exam  Inspection: No masses, redness, or swelling Alignment: Symmetrical Functional ROM: Unrestricted ROM Stability: No instability detected Muscle strength & Tone: Functionally intact Sensory: Unimpaired Palpation: No palpable anomalies  Upper Extremity (UE) Exam    Side: Right upper extremity  Side: Left upper extremity  Inspection: No masses, redness, swelling, or asymmetry. No contractures  Inspection: No masses, redness, swelling, or asymmetry. No contractures  Functional ROM: Unrestricted ROM  Functional ROM: Unrestricted ROM          Muscle strength & Tone: Functionally intact  Muscle strength & Tone: Functionally intact  Sensory: Unimpaired  Sensory: Unimpaired  Palpation: No palpable anomalies  Palpation: No palpable anomalies  Specialized Test(s): Deferred         Specialized Test(s): Deferred          Thoracic Spine Exam  Inspection: No masses, redness, or swelling Alignment: Symmetrical Functional ROM: Unrestricted ROM Stability: No instability detected Sensory: Unimpaired Muscle strength & Tone: No palpable anomalies  Lumbar Spine Exam  Inspection: No masses, redness, or swelling Alignment: Symmetrical Functional ROM: Diminished ROM Stability: No instability detected Muscle strength & Tone: Functionally intact Sensory: Movement-associated pain Palpation: Complains of area being tender to palpation Provocative Tests: Lumbar Hyperextension and rotation test: Positive bilaterally for facet joint pain. Patrick's Maneuver: evaluation deferred today              Gait & Posture  Assessment  Ambulation: Unassisted Gait: Relatively normal for age and body habitus Posture: WNL   Lower Extremity Exam    Side: Right lower extremity  Side: Left lower extremity  Inspection: No masses, redness, swelling, or asymmetry. No contractures  Inspection: No masses, redness, swelling, or asymmetry. No contractures  Functional ROM: Unrestricted ROM          Functional ROM: Unrestricted ROM          Muscle strength & Tone: Functionally intact  Muscle strength & Tone: Functionally intact  Sensory: Unimpaired  Sensory: Unimpaired  Palpation: No palpable anomalies  Palpation: No palpable anomalies   Assessment  Primary Diagnosis & Pertinent Problem List: The primary encounter diagnosis was Chronic low back pain (Location of Primary Source of Pain) (Bilateral) (L>R). Diagnoses of Lumbar facet syndrome (Location of Primary Source of Pain) (Bilateral) (L>R), Chronic knee pain (Location of Tertiary source of pain) (Bilateral) (L>R), Chronic hip pain, unspecified laterality, Chronic pain syndrome, Long term current use of opiate analgesic, and Opiate use (25 MME/Day) were also pertinent to this visit.  Status Diagnosis  Controlled Controlled Controlled 1. Chronic low back pain (Location of Primary Source of Pain) (Bilateral) (L>R)   2. Lumbar facet syndrome (Location of Primary Source of Pain) (Bilateral) (L>R)   3. Chronic knee pain (Location of Tertiary source of pain) (Bilateral) (L>R)   4. Chronic hip pain, unspecified laterality   5. Chronic pain syndrome   6. Long term current use of opiate analgesic   7. Opiate use (25 MME/Day)      Plan of Care  Pharmacotherapy (Medications Ordered): Meds ordered this encounter  Medications  . oxyCODONE (OXY IR/ROXICODONE) 5 MG immediate release tablet    Sig: Take 1 tablet (5 mg total) by mouth every 6 (six) hours as needed for severe pain.    Dispense:  120 tablet    Refill:  0    Fill one day early if pharmacy is closed on scheduled  refill date. Do not fill until: 04/28/16 To last until: 05/28/16  . oxyCODONE (OXY IR/ROXICODONE) 5 MG immediate release tablet    Sig: Take 1 tablet (5 mg total) by mouth every 6 (six) hours as needed for severe pain.    Dispense:  120 tablet    Refill:  0    Fill one day early if pharmacy is closed on scheduled refill date. Do not fill until: 05/28/16 To last until: 06/27/16  . oxyCODONE (OXY IR/ROXICODONE) 5 MG immediate release tablet    Sig:  Take 1 tablet (5 mg total) by mouth every 6 (six) hours as needed for severe pain.    Dispense:  120 tablet    Refill:  0    Fill one day early if pharmacy is closed on scheduled refill date. Do not fill until: 06/27/16 To last until: 07/27/16   New Prescriptions   OXYCODONE (OXY IR/ROXICODONE) 5 MG IMMEDIATE RELEASE TABLET    Take 1 tablet (5 mg total) by mouth every 6 (six) hours as needed for severe pain.   OXYCODONE (OXY IR/ROXICODONE) 5 MG IMMEDIATE RELEASE TABLET    Take 1 tablet (5 mg total) by mouth every 6 (six) hours as needed for severe pain.   OXYCODONE (OXY IR/ROXICODONE) 5 MG IMMEDIATE RELEASE TABLET    Take 1 tablet (5 mg total) by mouth every 6 (six) hours as needed for severe pain.   Medications administered today: Ms. Chrismer had no medications administered during this visit. Lab-work, procedure(s), and/or referral(s): Orders Placed This Encounter  Procedures  . Compliance Drug Analysis, Ur  . Comprehensive metabolic panel  . C-reactive protein  . Magnesium  . Sedimentation rate  . Vitamin B12  . 25-Hydroxyvitamin D Lcms D2+D3   Imaging and/or referral(s): None  Interventional therapies: Planned, scheduled, and/or pending:   Bilateral lumbar facet radiofrequency ablation under fluoroscopic guidance and IV sedation    Considering:   Diagnostic bilateral hip joint injection under fluoro, with or without sedation. Palliative bilateral lumbar facet block under fluoro and sedation. Bilateral lumbar facet radiofrequency  ablation.    Palliative PRN treatment(s):   Diagnostic bilateral hip joint injection under fluoro, with or without sedation. Bilateral lumbar facet block under fluoroscopic guidance and IV sedation plus left L1 transverse process injection    Provider-requested follow-up: Return in about 3 months (around 07/29/2016) for (Nurse Practitioner) Med-Mgmt, in addition, (PRN) procedure.  Future Appointments Date Time Provider [place]  06/08/2016 10:45 AM Milinda Pointer, MD [facility] None  07/21/2016 8:00 AM [place], NP [facility] None   Primary Care Physician: Leonel Ramsay, MD Location: [facility] Outpatient Pain Management Facility Note by: Kathlen Brunswick. Dossie Arbour, M.D, DABA, DABAPM, DABPM, DABIPP, FIPP Date: 04/28/2016; Time: 3:26 PM  Pain Score Disclaimer: We use the NRS-11 scale. This is a self-reported, subjective measurement of pain severity with only modest accuracy. It is used primarily to identify changes within a particular patient. It must be understood that outpatient pain scales are significantly less accurate that those used for research, where they can be applied under ideal controlled circumstances with minimal exposure to variables. In reality, the score is likely to be a combination of pain intensity and pain affect, where pain affect describes the degree of emotional arousal or changes in action readiness caused by the sensory experience of pain. Factors such as social and work situation, setting, emotional state, anxiety levels, expectation, and prior pain experience may influence pain perception and show large inter-individual differences that may also be affected by time variables.  Patient instructions provided during this appointment: Patient Instructions  Instructed to get labwork drawn today.If you do not get the labwork drawn, there will not be any refills given.  You were given 3 scripts for oxycodone today

## 2016-05-01 LAB — COMPLIANCE DRUG ANALYSIS, UR

## 2016-05-02 LAB — 25-HYDROXYVITAMIN D LCMS D2+D3
25-HYDROXY, VITAMIN D-2: 3.7 ng/mL
25-HYDROXY, VITAMIN D-3: 23 ng/mL

## 2016-05-02 LAB — 25-HYDROXY VITAMIN D LCMS D2+D3: 25-Hydroxy, Vitamin D: 27 ng/mL — ABNORMAL LOW

## 2016-05-04 ENCOUNTER — Encounter: Payer: Self-pay | Admitting: Pain Medicine

## 2016-05-04 ENCOUNTER — Other Ambulatory Visit: Payer: Self-pay | Admitting: Pain Medicine

## 2016-05-04 DIAGNOSIS — E559 Vitamin D deficiency, unspecified: Secondary | ICD-10-CM

## 2016-05-04 MED ORDER — VITAMIN D (ERGOCALCIFEROL) 1.25 MG (50000 UNIT) PO CAPS: ORAL_CAPSULE | ORAL | 0 refills | Status: DC

## 2016-05-04 MED ORDER — OVER THE COUNTER MEDICATION: 2400.0000 mg | Freq: Every day | 0 refills | Status: AC

## 2016-05-04 NOTE — Progress Notes (Signed)
Reason for ordering the test: To determine the cause of the diffuse arthralgias & myalgias. Finding(s): Low Vitamin D levels Explanation of findings:  Low Vitamin D Results: Normal levels: between 30 and 100 ng/mL. Vitamin D Insufficiency: Levels between 20-30 ng/ml are defined as a "Vitamin D insufficiency". Vitamin D Deficiency: Levels below 20 ng/ml, is diagnosed as a "Vitamin D Deficiency". Common causes include: dietary insufficiency; inadequate sun exposure; inability to absorb vitamin D from the intestines; or inability to process it due to kidney or liver disease. Low 25-hydroxyvitamin D: A low blood level of 25-hydroxyvitamin D may mean that a person is not getting enough exposure to sunlight or enough dietary vitamin D to meet his or her body's demand or that there is a problem with its absorption from the intestines. Occasionally, drugs used to treat seizures, particularly phenytoin (Dilantin), can interfere with the production of 25-hydroxyvitamin D in the liver. There is some evidence that vitamin D deficiency may increase the risk of some cancers, immune diseases, and cardiovascular disease. Low 1,25-dihydroxyvitamin D: A low level of 1,25-dihydroxyvitamin D can be seen in kidney disease and is one of the earliest changes to occur in persons with early kidney failure. Associated complications may include: hypocalcemia, hypophosphatemia, and reduced bone density. Associated symptoms: Vitamin D deficiencies and insufficiencies may be associated with fatigue, weakness, bone pain, joint pain, and muscle pain. Recommendation(s): Patient may benefit from taking over-the-counter Vitamin D3 supplements. I recommend a vitamin D + Calcium supplements. "Natures Bounty", a brand easily found in most pharmacies, has a formulation containing Calcium 1200 mg plus Vitamin D3 1000 IU, in Softgels capsules that are easy to swallow. This should be taken once a day, preferably in the morning as vitamin D will  increase energy levels and make it difficult to fall asleep, if taken at night. Patients with levels lower than 20 ng/ml should contact their primary care physicians to receive replacement therapy. Vitamin D3 can be obtained over-the-counter, without a prescription. Vitamin D2 requires a prescription and it is used for replacement therapy. ___________________________________________________________________________________  Normal CRP (C-reactive protein) Level(s): Less than <1.0 mg/dL. Elevated level(s): Levels above 1.0 mg/dL. Clinical significance: C-reactive protein (CRP) is produced by the liver. The level of CRP rises when there is inflammation throughout the body. CRP goes up in response to inflammation. High levels suggests the presence of chronic inflammation but do not identify its location or cause. Drops of previously elevated levels suggest that the inflammation or infection is subsiding and/or responding to treatment. Possible causes: High levels have been observed in obese patients, individuals with bacterial infections, chronic inflammation, or flare-ups of inflammatory conditions. Recommendations: - Consider the use of anti-inflammatory diet and medications. ___________________________________________________________________________________  - Normal chloride levels are between 95 and 107 mEq/L. Low levels may be due to: Addison disease; Bartter syndrome; burns; congestive heart failure; dehydration; excessive sweating; hyperaldosteronism; metabolic alkalosis; respiratory acidosis (compensated); Syndrome of inappropriate diuretic hormone secretion (SIADH); or vomiting. ___________________________________________________________________________________  - Normal fasting (NPO x 8 hours) glucose levels are between 65-99 mg/dl, with 2 hour fasting, levels are usually less than 140 mg/dl. Any random blood glucose level greater than 200 mg/dl is considered to be  Diabetes. ___________________________________________________________________________________  - The normal range for ALT (SGPT) values is about 7 to 56 units per liter. Muscle injections and/or strenuous exercise, may increase alanine aminotransferase (ALT) levels. Many drugs may raise levels by causing liver damage. Other causes of moderate increases include bile duct obstruction, cirrhosis, heart damage, alcohol abuse, and liver  tumors. In most types of liver diseases, the ALT level is higher than AST, leading to a low AST/ALT ratio( >1). Exceptions include alcoholic or acute hepatitis, cirrhosis, as well as heart and/or muscle injury. Levels  (>10 X normal) may be seen with acute hepatitis while results (sometimes >100 X normal) may indicate liver exposure to toxic substances. ___________________________________________________________________________________  - Normal levels of AST are between 5 and 40 U/L. Pregnancy, a muscle injection, or even strenuous exercise may increase AST levels. Acute burns, surgery, and seizures may raise AST levels as well. Very high levels of AST (> 10 X normal) are usually due to acute hepatitis. Levels > 100 X normal can be seen with liver exposure to hepatotoxic substances. Moderate increases may be seen in other diseases of the liver, especially when the bile ducts are blocked, or with cirrhosis or certain cancers of the liver. AST may also increase after heart attacks and with muscle injury, usually to a much greater degree than ALT. In most types of liver disease, the ALT level is higher than AST and the AST/ALT ratio will be low (less than 1). With heart or muscle injury, AST is often much higher than ALT (often 3-5 times as high) and levels tend to stay higher than ALT for longer than with liver injury. ___________________________________________________________________________________  - A normal sedimentation rate should be below 30 mm/hr. The sed rate is an  acute phase reactant that indirectly measures the degree of inflammation present in the body. It can be acute, developing rapidly after trauma, injury or infection, for example, or can occur over an extended time (chronic) with conditions such as autoimmune diseases or cancer. The ESR is not diagnostic; it is a non-specific, screening test that may be elevated in a number of these different conditions. It provides general information about the presence or absence of an inflammatory condition. ___________________________________________________________________________________  - The combined elevation of the ESR & CRP, may be suggestive of an autoimmune disease. Patients with elevated levels of both should consider an evaluation by a Rheumatologist. ___________________________________________________________________________________

## 2016-06-08 ENCOUNTER — Ambulatory Visit
Admission: RE | Admit: 2016-06-08 | Discharge: 2016-06-08 | Disposition: A | Discharge status: Home / Self Care | Payer: BLUE CROSS/BLUE SHIELD | Source: Ambulatory Visit | Attending: Pain Medicine | Admitting: Pain Medicine

## 2016-06-08 ENCOUNTER — Encounter: Payer: Self-pay | Admitting: Pain Medicine

## 2016-06-08 ENCOUNTER — Ambulatory Visit (HOSPITAL_BASED_OUTPATIENT_CLINIC_OR_DEPARTMENT_OTHER): Payer: BLUE CROSS/BLUE SHIELD | Admitting: Pain Medicine

## 2016-06-08 VITALS — BP 160/92 | HR 76 | Temp 98.1°F | Resp 16 | Ht 64.0 in | Wt 190.0 lb

## 2016-06-08 DIAGNOSIS — M4696 Unspecified inflammatory spondylopathy, lumbar region: Secondary | ICD-10-CM | POA: Diagnosis not present

## 2016-06-08 DIAGNOSIS — Z880 Allergy status to penicillin: Secondary | ICD-10-CM | POA: Diagnosis not present

## 2016-06-08 DIAGNOSIS — M47816 Spondylosis without myelopathy or radiculopathy, lumbar region: Secondary | ICD-10-CM | POA: Diagnosis not present

## 2016-06-08 DIAGNOSIS — M545 Low back pain: Secondary | ICD-10-CM | POA: Diagnosis not present

## 2016-06-08 DIAGNOSIS — Z885 Allergy status to narcotic agent status: Secondary | ICD-10-CM | POA: Insufficient documentation

## 2016-06-08 DIAGNOSIS — G8918 Other acute postprocedural pain: Secondary | ICD-10-CM | POA: Insufficient documentation

## 2016-06-08 DIAGNOSIS — M25551 Pain in right hip: Secondary | ICD-10-CM | POA: Diagnosis not present

## 2016-06-08 DIAGNOSIS — G8929 Other chronic pain: Secondary | ICD-10-CM | POA: Insufficient documentation

## 2016-06-08 MED ORDER — ROPIVACAINE HCL 2 MG/ML IJ SOLN
9.0000 mL | Freq: Once | INTRAMUSCULAR | Status: AC
  Administered 2016-06-08: 10 mL via PERINEURAL
  Filled 2016-06-08: qty 10

## 2016-06-08 MED ORDER — LIDOCAINE HCL (PF) 1 % IJ SOLN
10.0000 mL | Freq: Once | INTRAMUSCULAR | Status: AC
  Administered 2016-06-08: 5 mL
  Filled 2016-06-08: qty 10

## 2016-06-08 MED ORDER — MIDAZOLAM HCL 5 MG/5ML IJ SOLN
1.0000 mg | INTRAMUSCULAR | Status: DC | PRN
  Administered 2016-06-08: 2 mg via INTRAVENOUS
  Filled 2016-06-08: qty 5

## 2016-06-08 MED ORDER — TRIAMCINOLONE ACETONIDE 40 MG/ML IJ SUSP
40.0000 mg | Freq: Once | INTRAMUSCULAR | Status: AC
  Administered 2016-06-08: 40 mg
  Filled 2016-06-08: qty 1

## 2016-06-08 MED ORDER — FENTANYL CITRATE (PF) 100 MCG/2ML IJ SOLN
25.0000 ug | INTRAMUSCULAR | Status: DC | PRN
Start: 2016-06-08 — End: 2016-06-08
  Administered 2016-06-08: 100 ug via INTRAVENOUS
  Filled 2016-06-08: qty 2

## 2016-06-08 MED ORDER — HYDROCODONE-ACETAMINOPHEN 5-325 MG PO TABS: 1.0000 | ORAL_TABLET | Freq: Three times a day (TID) | ORAL | 0 refills | Status: DC | PRN

## 2016-06-08 MED ORDER — LACTATED RINGERS IV SOLN: 1000.0000 mL | Freq: Once | INTRAVENOUS | Status: DC

## 2016-06-08 NOTE — Progress Notes (Signed)
Safety precautions to be maintained throughout the outpatient stay will include: orient to surroundings, keep bed in low position, maintain call bell within reach at all times, provide assistance with transfer out of bed and ambulation.  

## 2016-06-08 NOTE — Progress Notes (Signed)
Patient's Name: Kelly Rollins  MRN: [id number]  Referring Provider: Leonel Ramsay, MD  DOB: 07-21-1970  PCP: Leonel Ramsay, MD  DOS: 06/08/2016  Note by: Kathlen Brunswick. Dossie Arbour, MD  Service setting: Ambulatory outpatient  Location: [facility] (AMB) Pain Management Facility  Visit type: Procedure  Specialty: Interventional Pain Management  Patient type: Established   Primary Reason for Visit: Interventional Pain Management Treatment. CC: Back Pain (bilaterally) and Hip Pain (right)  Procedure:  Anesthesia, Analgesia, Anxiolysis:  Type: Therapeutic Medial Branch Facet Radiofrequency Ablation Region: Lumbar Level: L2, L3, L4, L5, & S1 Medial Branch Level(s) Laterality: Left-Sided  Type: Local Anesthesia with Moderate (Conscious) Sedation Local Anesthetic: Lidocaine 1% Route: Intravenous (IV) IV Access: Secured Sedation: Meaningful verbal contact was maintained at all times during the procedure  Indication(s): Analgesia and Anxiety  Indications: 1. Lumbar facet syndrome (Location of Primary Source of Pain) (Bilateral) (L>R)   2. Lumbar spondylosis   3. Chronic low back pain (Location of Primary Source of Pain) (Bilateral) (L>R)   4. Acute postoperative pain    Kelly Rollins has either failed to respond, was unable to tolerate, or simply did not get enough benefit from other more conservative therapies including, but not limited to: 1. Over-the-counter medications 2. Anti-inflammatory medications 3. Muscle relaxants 4. Membrane stabilizers 5. Opioids 6. Physical therapy 7. Modalities (Heat, ice, etc.) 8. Invasive techniques such as nerve blocks. Kelly Rollins has attained more than 50% relief of the pain from a series of diagnostic injections conducted in separate occasions.  Pain Score: Pre-procedure: 3 /10 Post-procedure: 1 /10  Pre-op Assessment:  Previous date of service: 04/28/16 Service provided: Med Refill Kelly Rollins is a 46 y.o. (year old), female patient, seen today  for interventional treatment. She  has a past surgical history that includes Cesarean section; Vaginal hysterectomy; Cholecystectomy; and Breast reduction surgery. Her primarily concern today is the Back Pain (bilaterally) and Hip Pain (right)  Initial Vital Signs: Blood pressure (!) 163/92, pulse 85, temperature 97.8 F (36.6 C), temperature source Oral, resp. rate 16, height 5\' 4"  (1.626 m), weight 190 lb (86.2 kg), SpO2 99 %. BMI: 32.61 kg/m  Risk Assessment: Allergies: Reviewed. She is allergic to morphine; penicillins; and tramadol hcl.  Allergy Precautions: None required Coagulopathies: Reviewed. None identified.  Blood-thinner therapy: None at this time Active Infection(s): Reviewed. None identified. Kelly Rollins is afebrile  Site Confirmation: Kelly Rollins was asked to confirm the procedure and laterality before marking the site Procedure checklist: Completed Consent: Before the procedure and under the influence of no sedative(s), amnesic(s), or anxiolytics, the patient was informed of the treatment options, risks and possible complications. To fulfill our ethical and legal obligations, as recommended by the American Medical Association's Code of Ethics, I have informed the patient of my clinical impression; the nature and purpose of the treatment or procedure; the risks, benefits, and possible complications of the intervention; the alternatives, including doing nothing; the risk(s) and benefit(s) of the alternative treatment(s) or procedure(s); and the risk(s) and benefit(s) of doing nothing. The patient was provided information about the general risks and possible complications associated with the procedure. These may include, but are not limited to: failure to achieve desired goals, infection, bleeding, organ or nerve damage, allergic reactions, paralysis, and death. In addition, the patient was informed of those risks and complications associated to Spine-related procedures, such as  failure to decrease pain; infection (i.e.: Meningitis, epidural or intraspinal abscess); bleeding (i.e.: epidural hematoma, subarachnoid hemorrhage, or any other type of intraspinal  or peri-dural bleeding); organ or nerve damage (i.e.: Any type of peripheral nerve, nerve root, or spinal cord injury) with subsequent damage to sensory, motor, and/or autonomic systems, resulting in permanent pain, numbness, and/or weakness of one or several areas of the body; allergic reactions; (i.e.: anaphylactic reaction); and/or death. Furthermore, the patient was informed of those risks and complications associated with the medications. These include, but are not limited to: allergic reactions (i.e.: anaphylactic or anaphylactoid reaction(s)); adrenal axis suppression; blood sugar elevation that in diabetics may result in ketoacidosis or comma; water retention that in patients with history of congestive heart failure may result in shortness of breath, pulmonary edema, and decompensation with resultant heart failure; weight gain; swelling or edema; medication-induced neural toxicity; particulate matter embolism and blood vessel occlusion with resultant organ, and/or nervous system infarction; and/or aseptic necrosis of one or more joints. Finally, the patient was informed that Medicine is not an exact science; therefore, there is also the possibility of unforeseen or unpredictable risks and/or possible complications that may result in a catastrophic outcome. The patient indicated having understood very clearly. We have given the patient no guarantees and we have made no promises. Enough time was given to the patient to ask questions, all of which were answered to the patient's satisfaction. Kelly Rollins has indicated that she wanted to continue with the procedure. Attestation: I, the ordering provider, attest that I have discussed with the patient the benefits, risks, side-effects, alternatives, likelihood of achieving goals, and  potential problems during recovery for the procedure that I have provided informed consent. Date: 06/08/2016; Time: 11:09 AM  Pre-Procedure Preparation:  Monitoring: As per clinic protocol. Respiration, ETCO2, SpO2, BP, heart rate and rhythm monitor placed and checked for adequate function Safety Precautions: Patient was assessed for positional comfort and pressure points before starting the procedure. Time-out: I initiated and conducted the "Time-out" before starting the procedure, as per protocol. The patient was asked to participate by confirming the accuracy of the "Time Out" information. Verification of the correct person, site, and procedure were performed and confirmed by me, the nursing staff, and the patient. "Time-out" conducted as per Joint Commission's Universal Protocol (UP.01.01.01). "Time-out" Date & Time: 06/08/2016; 1145 hrs.  Description of Procedure Process:   Position: Prone Target Area: For Lumbar Facet blocks, the target is the groove formed by the junction of the transverse process and superior articular process. For the L5 dorsal ramus, the target is the notch between superior articular process and sacral ala. For the S1 dorsal ramus, the target is the superior and lateral edge of the posterior S1 Sacral foramen. Approach: Paraspinal approach. Area Prepped: Entire Posterior Lumbosacral Region Prepping solution: Hibiclens (4.0% Chlorhexidine gluconate solution) Safety Precautions: Aspiration looking for blood return was conducted prior to all injections. At no point did we inject any substances, as a needle was being advanced. No attempts were made at seeking any paresthesias. Safe injection practices and needle disposal techniques used. Medications properly checked for expiration dates. SDV (single dose vial) medications used. Description of the Procedure: Protocol guidelines were followed. The patient was placed in position over the fluoroscopy table. The target area was  identified and the area prepped in the usual manner. Skin desensitized using vapocoolant spray. Skin & deeper tissues infiltrated with local anesthetic. Appropriate amount of time allowed to pass for local anesthetics to take effect. Radiofrequency needles were introduced to the area of the medial branch at the junction of the superior articular process and transverse process using fluoroscopy. Using  the [employer], fashion/clothing, sensory stimulation using 50 Hz was used to locate & identify the nerve, making sure that the needle was positioned such that there was no sensory stimulation below 0.3 V or above 0.7 V. Stimulation using 2 Hz was used to evaluate the motor component. Care was taken not to lesion any nerves that demonstrated motor stimulation of the lower extremities at an output of less than 2.5 times that of the sensory threshold, or a maximum of 2.0 V. Once satisfactory placement of the needles was achieved, the above solution was slowly injected after negative aspiration. After waiting for at least 2 minutes, the ablation was performed at 80 degrees C for 60 seconds.The needles were then removed and the area cleansed, making sure to leave some of the prepping solution back to take advantage of its long term bactericidal properties. Vitals:   06/08/16 1222 06/08/16 1232 06/08/16 1242 06/08/16 1252  BP: (!) 165/94 (!) 141/88 (!) 144/79 (!) 160/92  Pulse: 83  82 76  Resp: 16 15 16 16   Temp:   98.1 F (36.7 C)   TempSrc:      SpO2: 93% 97% 97% 95%  Weight:      Height:        Start Time: 1145 hrs. End Time: 1218 hrs. Materials & Medications:  Needle(s) Type: Teflon-coated, curved tip, Radiofrequency needle(s) Gauge: 22G Length: 10cm Medication(s): We administered midazolam, fentaNYL, lidocaine (PF), triamcinolone acetonide, and ropivacaine (PF) 2 mg/mL (0.2%). Please see chart orders for dosing details.  Imaging Guidance (Spinal):  Type of Imaging Technique: Fluoroscopy  Guidance (Spinal) Indication(s): Assistance in needle guidance and placement for procedures requiring needle placement in or near specific anatomical locations not easily accessible without such assistance. Exposure Time: Please see nurses notes. Contrast: None used. Fluoroscopic Guidance: I was personally present during the use of fluoroscopy. "Tunnel Vision Technique" used to obtain the best possible view of the target area. Parallax error corrected before commencing the procedure. "Direction-depth-direction" technique used to introduce the needle under continuous pulsed fluoroscopy. Once target was reached, antero-posterior, oblique, and lateral fluoroscopic projection used confirm needle placement in all planes. Images permanently stored in EMR. Interpretation: No contrast injected. I personally interpreted the imaging intraoperatively. Adequate needle placement confirmed in multiple planes. Permanent images saved into the patient's record.  Antibiotic Prophylaxis:  Indication(s): None identified Antibiotic given: None  Post-operative Assessment:  EBL: None Complications: No immediate post-treatment complications observed by team, or reported by patient. Note: The patient tolerated the entire procedure well. A repeat set of vitals were taken after the procedure and the patient was kept under observation following institutional policy, for this type of procedure. Post-procedural neurological assessment was performed, showing return to baseline, prior to discharge. The patient was provided with post-procedure discharge instructions, including a section on how to identify potential problems. Should any problems arise concerning this procedure, the patient was given instructions to immediately contact us, at any time, without hesitation. In any case, we plan to contact the patient by telephone for a follow-up status report regarding this interventional procedure. Comments:  No additional relevant  information.  Plan of Care  Disposition: Discharge home  Discharge Date & Time: 06/08/2016; 1255 hrs.  Physician-requested Follow-up:  Return in about 2 weeks (around 06/22/2016) for contralateral RF (in 2-wks), by MD.  Future Appointments Date Time Provider [place]  06/23/2016 8:00 AM Milinda Pointer, MD [facility] None  07/21/2016 8:00 AM Vevelyn Francois, NP [facility] None   Medications ordered for procedure:  Meds ordered this encounter  Medications  . lactated ringers infusion 1,000 mL  . midazolam (VERSED) 5 MG/5ML injection 1-2 mg    Make sure Flumazenil is available in the pyxis when using this medication. If oversedation occurs, administer 0.2 mg IV over 15 sec. If after 45 sec no response, administer 0.2 mg again over 1 min; may repeat at 1 min intervals; not to exceed 4 doses (1 mg)  . fentaNYL (SUBLIMAZE) injection 25-50 mcg    Make sure Narcan is available in the pyxis when using this medication. In the event of respiratory depression (RR< 8/min): Titrate NARCAN (naloxone) in increments of 0.1 to 0.2 mg IV at 2-3 minute intervals, until desired degree of reversal.  . lidocaine (PF) (XYLOCAINE) 1 % injection 10 mL  . triamcinolone acetonide (KENALOG-40) injection 40 mg  . ropivacaine (PF) 2 mg/mL (0.2%) (NAROPIN) injection 9 mL  . HYDROcodone-acetaminophen (NORCO/VICODIN) 5-325 MG tablet    Sig: Take 1 tablet by mouth every 8 (eight) hours as needed for severe pain.    Dispense:  30 tablet    Refill:  0    For acute post-operative pain. Not to be refilled. To last until: 06/18/16   Medications administered: We administered midazolam, fentaNYL, lidocaine (PF), triamcinolone acetonide, and ropivacaine (PF) 2 mg/mL (0.2%).  See the medical record for exact dosing, route, and time of administration.  Lab-work, Procedure(s), & Referral(s) Ordered: Orders Placed This Encounter  Procedures  . LUMBAR FACET(MEDIAL BRANCH NERVE BLOCK) MBNB  . DG C-Arm 1-60 Min-No Report   . Informed Consent Details: Transcribe to consent form and obtain patient signature  . Provider attestation of informed consent for procedure/surgical case  . Verify informed consent  . Discharge instructions  . Follow-up   Imaging Ordered: No results found for this or any previous visit. New Prescriptions   HYDROCODONE-ACETAMINOPHEN (NORCO/VICODIN) 5-325 MG TABLET    Take 1 tablet by mouth every 8 (eight) hours as needed for severe pain.   Primary Care Physician: Leonel Ramsay, MD Location: [facility] Note by: Kathlen Brunswick. Dossie Arbour, M.D, DABA, DABAPM, DABPM, DABIPP, FIPP Date: 06/08/2016; Time: 1:22 PM  Disclaimer:  Medicine is not an Chief Strategy Officer. The only guarantee in medicine is that nothing is guaranteed. It is important to note that the decision to proceed with this intervention was based on the information collected from the patient. The Data and conclusions were drawn from the patient's questionnaire, the interview, and the physical examination. Because the information was provided in large part by the patient, it cannot be guaranteed that it has not been purposely or unconsciously manipulated. Every effort has been made to obtain as much relevant data as possible for this evaluation. It is important to note that the conclusions that lead to this procedure are derived in large part from the available data. Always take into account that the treatment will also be dependent on availability of resources and existing treatment guidelines, considered by other Pain Management Practitioners as being common knowledge and practice, at the time of the intervention. For Medico-Legal purposes, it is also important to point out that variation in procedural techniques and pharmacological choices are the acceptable norm. The indications, contraindications, technique, and results of the above procedure should only be interpreted and judged by a Board-Certified  Interventional Pain Specialist with extensive familiarity and expertise in the same exact procedure and technique.  Instructions provided at this appointment: Patient Instructions   Post-Procedure instructions Instructions:  Apply ice: Fill a  plastic sandwich bag with crushed ice. Cover it with a small towel and apply to injection site. Apply for 15 minutes then remove x 15 minutes. Repeat sequence on day of procedure, until you go to bed. The purpose is to minimize swelling and discomfort after procedure.  Apply heat: Apply heat to procedure site starting the day following the procedure. The purpose is to treat any soreness and discomfort from the procedure.  Food intake: Start with clear liquids (like water) and advance to regular food, as tolerated.   Physical activities: Keep activities to a minimum for the first 8 hours after the procedure.   Driving: If you have received any sedation, you are not allowed to drive for 24 hours after your procedure.  Blood thinner: Restart your blood thinner 6 hours after your procedure. (Only for those taking blood thinners)  Insulin: As soon as you can eat, you may resume your normal dosing schedule. (Only for those taking insulin)  Infection prevention: Keep procedure site clean and dry.  Post-procedure Pain Diary: Extremely important that this be done correctly and accurately. Recorded information will be used to determine the next step in treatment.  Pain evaluated is that of treated area only. Do not include pain from an untreated area.  Complete every hour, on the hour, for the initial 8 hours. Set an alarm to help you do this part accurately.  Do not go to sleep and have it completed later. It will not be accurate.  Follow-up appointment: Keep your follow-up appointment after the procedure. Usually 2 weeks for most procedures. (6 weeks in the case of radiofrequency.) Bring you pain diary.  Expect:  From numbing medicine (AKA: Local  Anesthetics): Numbness or decrease in pain.  Onset: Full effect within 15 minutes of injected.  Duration: It will depend on the type of local anesthetic used. On the average, 1 to 8 hours.   From steroids: Decrease in swelling or inflammation. Once inflammation is improved, relief of the pain will follow.  Onset of benefits: Depends on the amount of swelling present. The more swelling, the longer it will take for the benefits to be seen.   Duration: Steroids will stay in the system x 2 weeks. Duration of benefits will depend on multiple posibilities including persistent irritating factors.  From procedure: Some discomfort is to be expected once the numbing medicine wears off. This should be minimal if ice and heat are applied as instructed. Call if:  You experience numbness and weakness that gets worse with time, as opposed to wearing off.  New onset bowel or bladder incontinence. (Spinal procedures only)  Emergency Numbers:  Durning business hours (Monday - Thursday, 8:00 AM - 4:00 PM) (Friday, 9:00 AM - 12:00 Noon): [phone]  After hours: [phone] _____________________________________________________________________________________________  Pain Management Discharge Instructions  General Discharge Instructions :  If you need to reach your doctor call: Monday-Friday 8:00 am - 4:00 pm at [phone] or toll free [phone].  After clinic hours [phone] to have operator reach doctor.  Bring all of your medication bottles to all your appointments in the pain clinic.  To cancel or reschedule your appointment with Pain Management please remember to call 24 hours in advance to avoid a fee.  Refer to the educational materials which you have been given on: General Risks, I had my Procedure. Discharge Instructions, Post Sedation.  Post Procedure Instructions:  The drugs you were given will stay in your system until tomorrow, so for the next 24 hours you  should not  drive, make any legal decisions or drink any alcoholic beverages.  You may eat anything you prefer, but it is better to start with liquids then soups and crackers, and gradually work up to solid foods.  Please notify your doctor immediately if you have any unusual bleeding, trouble breathing or pain that is not related to your normal pain.  Depending on the type of procedure that was done, some parts of your body may feel week and/or numb.  This usually clears up by tonight or the next day.  Walk with the use of an assistive device or accompanied by an adult for the 24 hours.  You may use ice on the affected area for the first 24 hours.  Put ice in a Ziploc bag and cover with a towel and place against area 15 minutes on 15 minutes off.  You may switch to heat after 24 hours.Radiofrequency Lesioning, Care After Refer to this sheet in the next few weeks. These instructions provide you with information about caring for yourself after your procedure. Your health care provider may also give you more specific instructions. Your treatment has been planned according to current medical practices, but problems sometimes occur. Call your health care provider if you have any problems or questions after your procedure. What can I expect after the procedure? After the procedure, it is common to have:  Pain from the burned nerve.  Temporary numbness. Follow these instructions at home:  Take over-the-counter and prescription medicines only as told by your health care provider.  Return to your normal activities as told by your health care provider. Ask your health care provider what activities are safe for you.  Pay close attention to how you feel after the procedure. If you start to have pain, write down when it hurts and how it feels. This will help you and your health care provider to know if you need an additional treatment.  Check your needle insertion site every day for signs of infection. Watch  for:  Redness, swelling, or pain.  Fluid, blood, or pus.  Keep all follow-up visits as told by your health care provider. This is important. Contact a health care provider if:  Your pain does not get better.  You have redness, swelling, or pain at the needle insertion site.  You have fluid, blood, or pus coming from the needle insertion site.  You have a fever. Get help right away if:  You develop sudden, severe pain.  You develop numbness or tingling near the procedure site that does not go away. This information is not intended to replace advice given to you by your health care provider. Make sure you discuss any questions you have with your health care provider. Document Released: 09/18/2010 Document Revised: 06/27/2015 Document Reviewed: 02/26/2014 Elsevier Interactive Patient Education  2017 [employer]. Radiofrequency Lesioning Radiofrequency lesioning is a procedure that is performed to relieve pain. The procedure is often used for back, neck, or arm pain. Radiofrequency lesioning involves the use of a machine that creates radio waves to make heat. During the procedure, the heat is applied to the nerve that carries the pain signal. The heat damages the nerve and interferes with the pain signal. Pain relief usually starts about 2 weeks after the procedure and lasts for 6 months to 1 year. Tell a health care provider about:  Any allergies you have.  All medicines you are taking, including vitamins, herbs, eye drops, creams, and over-the-counter medicines.  Any problems you  or family members have had with anesthetic medicines.  Any blood disorders you have.  Any surgeries you have had.  Any medical conditions you have.  Whether you are pregnant or may be pregnant. What are the risks? Generally, this is a safe procedure. However, problems may occur, including:  Pain or soreness at the injection site.  Infection at the injection site.  Damage to nerves or blood  vessels. What happens before the procedure?  Ask your health care provider about:  Changing or stopping your regular medicines. This is especially important if you are taking diabetes medicines or blood thinners.  Taking medicines such as aspirin and ibuprofen. These medicines can thin your blood. Do not take these medicines before your procedure if your health care provider instructs you not to.  Follow instructions from your health care provider about eating or drinking restrictions.  Plan to have someone take you home after the procedure.  If you go home right after the procedure, plan to have someone with you for 24 hours. What happens during the procedure?  You will be given one or more of the following:  A medicine to help you relax (sedative).  A medicine to numb the area (local anesthetic).  You will be awake during the procedure. You will need to be able to talk with the health care provider during the procedure.  With the help of a type of X-ray (fluoroscopy), the health care provider will insert a radiofrequency needle into the area to be treated.  Next, a wire that carries the radio waves (electrode) will be put through the radiofrequency needle. An electrical pulse will be sent through the electrode to verify the correct nerve. You will feel a tingling sensation, and you may have muscle twitching.  Then, the tissue that is around the needle tip will be heated by an electric current that is passed using the radiofrequency machine. This will numb the nerves.  A bandage (dressing) will be put on the insertion area after the procedure is done. The procedure may vary among health care providers and hospitals. What happens after the procedure?  Your blood pressure, heart rate, breathing rate, and blood oxygen level will be monitored often until the medicines you were given have worn off.  Return to your normal activities as directed by your health care provider. This  information is not intended to replace advice given to you by your health care provider. Make sure you discuss any questions you have with your health care provider. Document Released: 09/17/2010 Document Revised: 06/27/2015 Document Reviewed: 02/26/2014 Elsevier Interactive Patient Education  2017 [place]  What are the risk, side effects and possible complications? Generally speaking, most procedures are safe.  However, with any procedure there are risks, side effects, and the possibility of complications.  The risks and complications are dependent upon the sites that are lesioned, or the type of nerve block to be performed.  The closer the procedure is to the spine, the more serious the risks are.  Great care is taken when placing the radio frequency needles, block needles or lesioning probes, but sometimes complications can occur. 1. Infection: Any time there is an injection through the skin, there is a risk of infection.  This is why sterile conditions are used for these blocks.  There are four possible types of infection. 1. Localized skin infection. 2. Central Nervous System Infection-This can be in the form of Meningitis, which can be deadly. 3. Epidural Infections-This can  be in the form of an epidural abscess, which can cause pressure inside of the spine, causing compression of the spinal cord with subsequent paralysis. This would require an emergency surgery to decompress, and there are no guarantees that the patient would recover from the paralysis. 4. Discitis-This is an infection of the intervertebral discs.  It occurs in about 1% of discography procedures.  It is difficult to treat and it may lead to surgery.        2. Pain: the needles have to go through skin and soft tissues, will cause soreness.       3. Damage to internal structures:  The nerves to be lesioned may be near blood vessels or    other nerves which can be potentially damaged.        4. Bleeding: Bleeding is more common if the patient is taking blood thinners such as  aspirin, Coumadin, Ticiid, Plavix, etc., or if he/she have some genetic predisposition  such as hemophilia. Bleeding into the spinal canal can cause compression of the spinal  cord with subsequent paralysis.  This would require an emergency surgery to  decompress and there are no guarantees that the patient would recover from the  paralysis.       5. Pneumothorax:  Puncturing of a lung is a possibility, every time a needle is introduced in  the area of the chest or upper back.  Pneumothorax refers to free air around the  collapsed lung(s), inside of the thoracic cavity (chest cavity).  Another two possible  complications related to a similar event would include: Hemothorax and Chylothorax.   These are variations of the Pneumothorax, where instead of air around the collapsed  lung(s), you may have blood or chyle, respectively.       6. Spinal headaches: They may occur with any procedures in the area of the spine.       7. Persistent CSF (Cerebro-Spinal Fluid) leakage: This is a rare problem, but may occur  with prolonged intrathecal or epidural catheters either due to the formation of a fistulous  track or a dural tear.       8. Nerve damage: By working so close to the spinal cord, there is always a possibility of  nerve damage, which could be as serious as a permanent spinal cord injury with  paralysis.       9. Death:  Although rare, severe deadly allergic reactions known as "Anaphylactic  reaction" can occur to any of the medications used.      10. Worsening of the symptoms:  We can always make thing worse.  What are the chances of something like this happening? Chances of any of this occuring are extremely low.  By statistics, you have more of a chance of getting killed in a motor vehicle accident: while driving to the hospital than any of the above occurring .  Nevertheless, you should be aware that they are  possibilities.  In general, it is similar to taking a shower.  Everybody knows that you can slip, hit your head and get killed.  Does that mean that you should not shower again?  Nevertheless always keep in mind that statistics do not mean anything if you happen to be on the wrong side of them.  Even if a procedure has a 1 (one) in a 1,000,000 (million) chance of going wrong, it you happen to be that one..Also, keep in mind that by statistics, you have more of a chance of having  something go wrong when taking medications.  Who should not have this procedure? If you are on a blood thinning medication (e.g. Coumadin, Plavix, see list of "Blood Thinners"), or if you have an active infection going on, you should not have the procedure.  If you are taking any blood thinners, please inform your physician.  How should I prepare for this procedure?  Do not eat or drink anything at least six hours prior to the procedure.  Bring a driver with you .  It cannot be a taxi.  Come accompanied by an adult that can drive you back, and that is strong enough to help you if your legs get weak or numb from the local anesthetic.  Take all of your medicines the morning of the procedure with just enough water to swallow them.  If you have diabetes, make sure that you are scheduled to have your procedure done first thing in the morning, whenever possible.  If you have diabetes, take only half of your insulin dose and notify our nurse that you have done so as soon as you arrive at the clinic.  If you are diabetic, but only take blood sugar pills (oral hypoglycemic), then do not take them on the morning of your procedure.  You may take them after you have had the procedure.  Do not take aspirin or any aspirin-containing medications, at least eleven (11) days prior to the procedure.  They may prolong bleeding.  Wear loose fitting clothing that may be easy to take off and that you would not mind if it got stained with  Betadine or blood.  Do not wear any jewelry or perfume  Remove any nail coloring.  It will interfere with some of our monitoring equipment.  NOTE: Remember that this is not meant to be interpreted as a complete list of all possible complications.  Unforeseen problems may occur.  BLOOD THINNERS The following drugs contain aspirin or other products, which can cause increased bleeding during surgery and should not be taken for 2 weeks prior to and 1 week after surgery.  If you should need take something for relief of minor pain, you may take acetaminophen which is found in Tylenol,m Datril, Anacin-3 and Panadol. It is not blood thinner. The products listed below are.  Do not take any of the products listed below in addition to any listed on your instruction sheet.  A.P.C or A.P.C with Codeine Codeine Phosphate Capsules #3 Ibuprofen Ridaura  ABC compound Congesprin Imuran rimadil  Advil Cope Indocin Robaxisal  Alka-Seltzer Effervescent Pain Reliever and Antacid Coricidin or Coricidin-D  Indomethacin Rufen  Alka-Seltzer plus Cold Medicine Cosprin Ketoprofen S-A-C Tablets  Anacin Analgesic Tablets or Capsules Coumadin Korlgesic Salflex  Anacin Extra Strength Analgesic tablets or capsules CP-2 Tablets Lanoril Salicylate  Anaprox Cuprimine Capsules Levenox Salocol  Anexsia-D Dalteparin Magan Salsalate  Anodynos Darvon compound Magnesium Salicylate Sine-off  Ansaid Dasin Capsules Magsal Sodium Salicylate  Anturane Depen Capsules Marnal Soma  APF Arthritis pain formula Dewitt's Pills Measurin Stanback  Argesic Dia-Gesic Meclofenamic Sulfinpyrazone  Arthritis Bayer Timed Release Aspirin Diclofenac Meclomen Sulindac  Arthritis pain formula Anacin Dicumarol Medipren Supac  Analgesic (Safety coated) Arthralgen Diffunasal Mefanamic Suprofen  Arthritis Strength Bufferin Dihydrocodeine Mepro Compound Suprol  Arthropan liquid Dopirydamole Methcarbomol with Aspirin Synalgos  ASA tablets/Enseals Disalcid  Micrainin Tagament  Ascriptin Doan's Midol Talwin  Ascriptin A/D Dolene Mobidin Tanderil  Ascriptin Extra Strength Dolobid Moblgesic Ticlid  Ascriptin with Codeine Doloprin or Doloprin with Codeine Momentum Tolectin  Asperbuf Duoprin Mono-gesic Trendar  Aspergum Duradyne Motrin or Motrin IB Triminicin  Aspirin plain, buffered or enteric coated Durasal Myochrisine Trigesic  Aspirin Suppositories Easprin Nalfon Trillsate  Aspirin with Codeine Ecotrin Regular or Extra Strength Naprosyn Uracel  Atromid-S Efficin Naproxen Ursinus  Auranofin Capsules Elmiron Neocylate Vanquish  Axotal Emagrin Norgesic Verin  Azathioprine Empirin or Empirin with Codeine Normiflo Vitamin E  Azolid Emprazil Nuprin Voltaren  Bayer Aspirin plain, buffered or children's or timed BC Tablets or powders Encaprin Orgaran Warfarin Sodium  Buff-a-Comp Enoxaparin Orudis Zorpin  Buff-a-Comp with Codeine Equegesic Os-Cal-Gesic   Buffaprin Excedrin plain, buffered or Extra Strength Oxalid   Bufferin Arthritis Strength Feldene Oxphenbutazone   Bufferin plain or Extra Strength Feldene Capsules Oxycodone with Aspirin   Bufferin with Codeine Fenoprofen Fenoprofen Pabalate or Pabalate-SF   Buffets II Flogesic Panagesic   Buffinol plain or Extra Strength Florinal or Florinal with Codeine Panwarfarin   Buf-Tabs Flurbiprofen Penicillamine   Butalbital Compound Four-way cold tablets Penicillin   Butazolidin Fragmin Pepto-Bismol   Carbenicillin Geminisyn Percodan   Carna Arthritis Reliever Geopen Persantine   Carprofen Gold's salt Persistin   Chloramphenicol Goody's Phenylbutazone   Chloromycetin Haltrain Piroxlcam   Clmetidine heparin Plaquenil   Cllnoril Hyco-pap Ponstel   Clofibrate Hydroxy chloroquine Propoxyphen         Before stopping any of these medications, be sure to consult the physician who ordered them.  Some, such as Coumadin (Warfarin) are ordered to prevent or treat serious conditions such as "deep thrombosis",  "pumonary embolisms", and other heart problems.  The amount of time that you may need off of the medication may also vary with the medication and the reason for which you were taking it.  If you are taking any of these medications, please make sure you notify your pain physician before you undergo any procedures.         Radiofrequency Lesioning Radiofrequency lesioning is a procedure that is performed to relieve pain. The procedure is often used for back, neck, or arm pain. Radiofrequency lesioning involves the use of a machine that creates radio waves to make heat. During the procedure, the heat is applied to the nerve that carries the pain signal. The heat damages the nerve and interferes with the pain signal. Pain relief usually starts about 2 weeks after the procedure and lasts for 6 months to 1 year. Tell a health care provider about:  Any allergies you have.  All medicines you are taking, including vitamins, herbs, eye drops, creams, and over-the-counter medicines.  Any problems you or family members have had with anesthetic medicines.  Any blood disorders you have.  Any surgeries you have had.  Any medical conditions you have.  Whether you are pregnant or may be pregnant. What are the risks? Generally, this is a safe procedure. However, problems may occur, including:  Pain or soreness at the injection site.  Infection at the injection site.  Damage to nerves or blood vessels. What happens before the procedure?  Ask your health care provider about:  Changing or stopping your regular medicines. This is especially important if you are taking diabetes medicines or blood thinners.  Taking medicines such as aspirin and ibuprofen. These medicines can thin your blood. Do not take these medicines before your procedure if your health care provider instructs you not to.  Follow instructions from your health care provider about eating or drinking restrictions.  Plan to have  someone take you home after the procedure.  If you go  home right after the procedure, plan to have someone with you for 24 hours. What happens during the procedure?  You will be given one or more of the following:  A medicine to help you relax (sedative).  A medicine to numb the area (local anesthetic).  You will be awake during the procedure. You will need to be able to talk with the health care provider during the procedure.  With the help of a type of X-ray (fluoroscopy), the health care provider will insert a radiofrequency needle into the area to be treated.  Next, a wire that carries the radio waves (electrode) will be put through the radiofrequency needle. An electrical pulse will be sent through the electrode to verify the correct nerve. You will feel a tingling sensation, and you may have muscle twitching.  Then, the tissue that is around the needle tip will be heated by an electric current that is passed using the radiofrequency machine. This will numb the nerves.  A bandage (dressing) will be put on the insertion area after the procedure is done. The procedure may vary among health care providers and hospitals. What happens after the procedure?  Your blood pressure, heart rate, breathing rate, and blood oxygen level will be monitored often until the medicines you were given have worn off.  Return to your normal activities as directed by your health care provider. This information is not intended to replace advice given to you by your health care provider. Make sure you discuss any questions you have with your health care provider. Document Released: 09/17/2010 Document Revised: 06/27/2015 Document Reviewed: 02/26/2014 Elsevier Interactive Patient Education  2017 [place]. Pain Management Discharge Instructions  General Discharge Instructions :  If you need to reach your doctor call: Monday-Friday 8:00 am - 4:00 pm at [phone] or toll free [phone].  After  clinic hours [phone] to have operator reach doctor.  Bring all of your medication bottles to all your appointments in the pain clinic.  To cancel or reschedule your appointment with Pain Management please remember to call 24 hours in advance to avoid a fee.  Refer to the educational materials which you have been given on: General Risks, I had my Procedure. Discharge Instructions, Post Sedation.  Post Procedure Instructions:  The drugs you were given will stay in your system until tomorrow, so for the next 24 hours you should not drive, make any legal decisions or drink any alcoholic beverages.  You may eat anything you prefer, but it is better to start with liquids then soups and crackers, and gradually work up to solid foods.  Please notify your doctor immediately if you have any unusual bleeding, trouble breathing or pain that is not related to your normal pain.  Depending on the type of procedure that was done, some parts of your body may feel week and/or numb.  This usually clears up by tonight or the next day.  Walk with the use of an assistive device or accompanied by an adult for the 24 hours.  You may use ice on the affected area for the first 24 hours.  Put ice in a Ziploc bag and cover with a towel and place against area 15 minutes on 15 minutes off.  You may switch to heat after 24 hours.____________________________________________________________________________________________  Medication Rules  Applies to: All patients receiving prescriptions (written or electronic).  Pharmacy of record: Pharmacy where electronic prescriptions will be sent. If written prescriptions are taken to a different pharmacy, please inform the nursing  staff. The pharmacy listed in the electronic medical record should be the one where you would like electronic prescriptions to be sent.  Prescription refills: Only during scheduled appointments. Applies to both, written and electronic  prescriptions.  NOTE: The following applies primarily to controlled substances (Opioid Pain Medications)  Patient's responsibilities: 1. Pain Pills: Bring all pain pills to every appointment (except for procedure appointments). 2. Pill Bottles: Bring pills in original pharmacy bottle. Always bring newest bottle. Bring bottle, even if empty. 3. Medication refills: You are responsible for knowing and keeping track of what medications you need refilled. The day before your appointment, write a list of all prescriptions that need to be refilled. Bring that list to your appointment and give it to the admitting nurse. Prescriptions will be written only during appointments. If you forget a medication, it will not be "Called in", "Faxed", or "electronically sent". You will need to get another appointment to get these prescribed. 4. Prescription Accuracy: You are responsible for carefully inspecting your prescriptions before leaving our office. Have the discharge nurse carefully go over each prescription with you, before taking them home. Make sure that your name is accurately spelled, that your address is correct. Check the name and dose of your medication to make sure it is accurate. Check the number of pills, and the written instructions to make sure they are clear and accurate. Make sure that you are given enough medication to last until your next medication refill appointment. 5. Taking Medication: Take medication as prescribed. Never take more pills than instructed. Never take medication more frequently than prescribed. Taking less pills or less frequently is permitted and encouraged, when it comes to controlled substances (written prescriptions).  6. Inform other Doctors: Always inform, all of your healthcare providers, of all the medications you take. 7. Pain Medication from other Providers: You are not allowed to accept any additional pain medication from any other Doctor or Healthcare provider. There are  two exceptions to this rule. (see below) In the event that you require additional pain medication, you are responsible for notifying us, as stated below. 8. Medication Agreement: You are responsible for carefully reading and following our Medication Agreement. This must be signed before receiving any prescriptions from our practice. Safely store a copy of your signed Agreement. Violations to the Agreement will result in no further prescriptions. (Additional copies of our Medication Agreement are available upon request.) 9. Laws, Rules, & Regulations: All patients are expected to follow all Federal and [employer], Rules, [employer] [facility]. Ignorance of the Laws does not constitute a valid excuse.  Exceptions: There are only two exceptions to the rule of not receiving pain medications from other Healthcare Providers. 1. Exception #1 (Emergencies): In the event of an emergency (i.e.: accident requiring emergency care), you are allowed to receive additional pain medication. However, you are responsible for: As soon as you are able, call our office [phone], at any time of the day or night, and leave a message stating your name, the date and nature of the emergency, and the name and dose of the medication prescribed. In the event that your call is answered by a member of our staff, make sure to document and save the date, time, and the name of the person that took your information.  2. Exception #2 (Planned Surgery): In the event that you are scheduled by another doctor or dentist to have any type of surgery or procedure, you are allowed (for a period no longer than  30 days), to receive additional pain medication, for the acute post-op pain. However, in this case, you are responsible for picking up a copy of our "Post-op Pain Management for Surgeons" handout, and giving it to your surgeon or dentist. This document is available at our office, and does not require an appointment to obtain it. Simply go to  our office during business hours (Monday-Thursday from 8:00 AM to 4:00 PM) (Friday 8:00 AM to 12:00 Noon) or if you have a scheduled appointment with [place], prior to your surgery, and ask for it by name. In addition, you will need to provide [place] with your name, name of your surgeon, type of surgery, and date of procedure or surgery.  _____________________________________________________________________________________________Radiofrequency Lesioning, Care After Refer to this sheet in the next few weeks. These instructions provide you with information about caring for yourself after your procedure. Your health care provider may also give you more specific instructions. Your treatment has been planned according to current medical practices, but problems sometimes occur. Call your health care provider if you have any problems or questions after your procedure. What can I expect after the procedure? After the procedure, it is common to have:  Pain from the burned nerve.  Temporary numbness. Follow these instructions at home:  Take over-the-counter and prescription medicines only as told by your health care provider.  Return to your normal activities as told by your health care provider. Ask your health care provider what activities are safe for you.  Pay close attention to how you feel after the procedure. If you start to have pain, write down when it hurts and how it feels. This will help you and your health care provider to know if you need an additional treatment.  Check your needle insertion site every day for signs of infection. Watch for:  Redness, swelling, or pain.  Fluid, blood, or pus.  Keep all follow-up visits as told by your health care provider. This is important. Contact a health care provider if:  Your pain does not get better.  You have redness, swelling, or pain at the needle insertion site.  You have fluid, blood, or pus coming from the needle insertion site.  You have a  fever. Get help right away if:  You develop sudden, severe pain.  You develop numbness or tingling near the procedure site that does not go away. This information is not intended to replace advice given to you by your health care provider. Make sure you discuss any questions you have with your health care provider. Document Released: 09/18/2010 Document Revised: 06/27/2015 Document Reviewed: 02/26/2014 Elsevier Interactive Patient Education  2017 [employer].

## 2016-06-08 NOTE — Patient Instructions (Addendum)
Post-Procedure instructions Instructions:  Apply ice: Fill a plastic sandwich bag with crushed ice. Cover it with a small towel and apply to injection site. Apply for 15 minutes then remove x 15 minutes. Repeat sequence on day of procedure, until you go to bed. The purpose is to minimize swelling and discomfort after procedure.  Apply heat: Apply heat to procedure site starting the day following the procedure. The purpose is to treat any soreness and discomfort from the procedure.  Food intake: Start with clear liquids (like water) and advance to regular food, as tolerated.   Physical activities: Keep activities to a minimum for the first 8 hours after the procedure.   Driving: If you have received any sedation, you are not allowed to drive for 24 hours after your procedure.  Blood thinner: Restart your blood thinner 6 hours after your procedure. (Only for those taking blood thinners)  Insulin: As soon as you can eat, you may resume your normal dosing schedule. (Only for those taking insulin)  Infection prevention: Keep procedure site clean and dry.  Post-procedure Pain Diary: Extremely important that this be done correctly and accurately. Recorded information will be used to determine the next step in treatment.  Pain evaluated is that of treated area only. Do not include pain from an untreated area.  Complete every hour, on the hour, for the initial 8 hours. Set an alarm to help you do this part accurately.  Do not go to sleep and have it completed later. It will not be accurate.  Follow-up appointment: Keep your follow-up appointment after the procedure. Usually 2 weeks for most procedures. (6 weeks in the case of radiofrequency.) Bring you pain diary.  Expect:  From numbing medicine (AKA: Local Anesthetics): Numbness or decrease in pain.  Onset: Full effect within 15 minutes of injected.  Duration: It will depend on the type of local anesthetic used. On the average, 1 to 8  hours.   From steroids: Decrease in swelling or inflammation. Once inflammation is improved, relief of the pain will follow.  Onset of benefits: Depends on the amount of swelling present. The more swelling, the longer it will take for the benefits to be seen.   Duration: Steroids will stay in the system x 2 weeks. Duration of benefits will depend on multiple posibilities including persistent irritating factors.  From procedure: Some discomfort is to be expected once the numbing medicine wears off. This should be minimal if ice and heat are applied as instructed. Call if:  You experience numbness and weakness that gets worse with time, as opposed to wearing off.  New onset bowel or bladder incontinence. (Spinal procedures only)  Emergency Numbers:  Durning business hours (Monday - Thursday, 8:00 AM - 4:00 PM) (Friday, 9:00 AM - 12:00 Noon): [phone]  After hours: [phone] _____________________________________________________________________________________________  Pain Management Discharge Instructions  General Discharge Instructions :  If you need to reach your doctor call: Monday-Friday 8:00 am - 4:00 pm at [phone] or toll free [phone].  After clinic hours [phone] to have operator reach doctor.  Bring all of your medication bottles to all your appointments in the pain clinic.  To cancel or reschedule your appointment with Pain Management please remember to call 24 hours in advance to avoid a fee.  Refer to the educational materials which you have been given on: General Risks, I had my Procedure. Discharge Instructions, Post Sedation.  Post Procedure Instructions:  The drugs you were given will stay in your system until  tomorrow, so for the next 24 hours you should not drive, make any legal decisions or drink any alcoholic beverages.  You may eat anything you prefer, but it is better to start with liquids then soups and crackers, and gradually work  up to solid foods.  Please notify your doctor immediately if you have any unusual bleeding, trouble breathing or pain that is not related to your normal pain.  Depending on the type of procedure that was done, some parts of your body may feel week and/or numb.  This usually clears up by tonight or the next day.  Walk with the use of an assistive device or accompanied by an adult for the 24 hours.  You may use ice on the affected area for the first 24 hours.  Put ice in a Ziploc bag and cover with a towel and place against area 15 minutes on 15 minutes off.  You may switch to heat after 24 hours.Radiofrequency Lesioning, Care After Refer to this sheet in the next few weeks. These instructions provide you with information about caring for yourself after your procedure. Your health care provider may also give you more specific instructions. Your treatment has been planned according to current medical practices, but problems sometimes occur. Call your health care provider if you have any problems or questions after your procedure. What can I expect after the procedure? After the procedure, it is common to have:  Pain from the burned nerve.  Temporary numbness. Follow these instructions at home:  Take over-the-counter and prescription medicines only as told by your health care provider.  Return to your normal activities as told by your health care provider. Ask your health care provider what activities are safe for you.  Pay close attention to how you feel after the procedure. If you start to have pain, write down when it hurts and how it feels. This will help you and your health care provider to know if you need an additional treatment.  Check your needle insertion site every day for signs of infection. Watch for:  Redness, swelling, or pain.  Fluid, blood, or pus.  Keep all follow-up visits as told by your health care provider. This is important. Contact a health care provider if:  Your  pain does not get better.  You have redness, swelling, or pain at the needle insertion site.  You have fluid, blood, or pus coming from the needle insertion site.  You have a fever. Get help right away if:  You develop sudden, severe pain.  You develop numbness or tingling near the procedure site that does not go away. This information is not intended to replace advice given to you by your health care provider. Make sure you discuss any questions you have with your health care provider. Document Released: 09/18/2010 Document Revised: 06/27/2015 Document Reviewed: 02/26/2014 Elsevier Interactive Patient Education  2017 [place]. Radiofrequency Lesioning Radiofrequency lesioning is a procedure that is performed to relieve pain. The procedure is often used for back, neck, or arm pain. Radiofrequency lesioning involves the use of a machine that creates radio waves to make heat. During the procedure, the heat is applied to the nerve that carries the pain signal. The heat damages the nerve and interferes with the pain signal. Pain relief usually starts about 2 weeks after the procedure and lasts for 6 months to 1 year. Tell a health care provider about:  Any allergies you have.  All medicines you are taking, including vitamins, herbs, eye drops,  creams, and over-the-counter medicines.  Any problems you or family members have had with anesthetic medicines.  Any blood disorders you have.  Any surgeries you have had.  Any medical conditions you have.  Whether you are pregnant or may be pregnant. What are the risks? Generally, this is a safe procedure. However, problems may occur, including:  Pain or soreness at the injection site.  Infection at the injection site.  Damage to nerves or blood vessels. What happens before the procedure?  Ask your health care provider about:  Changing or stopping your regular medicines. This is especially important if you are taking diabetes  medicines or blood thinners.  Taking medicines such as aspirin and ibuprofen. These medicines can thin your blood. Do not take these medicines before your procedure if your health care provider instructs you not to.  Follow instructions from your health care provider about eating or drinking restrictions.  Plan to have someone take you home after the procedure.  If you go home right after the procedure, plan to have someone with you for 24 hours. What happens during the procedure?  You will be given one or more of the following:  A medicine to help you relax (sedative).  A medicine to numb the area (local anesthetic).  You will be awake during the procedure. You will need to be able to talk with the health care provider during the procedure.  With the help of a type of X-ray (fluoroscopy), the health care provider will insert a radiofrequency needle into the area to be treated.  Next, a wire that carries the radio waves (electrode) will be put through the radiofrequency needle. An electrical pulse will be sent through the electrode to verify the correct nerve. You will feel a tingling sensation, and you may have muscle twitching.  Then, the tissue that is around the needle tip will be heated by an electric current that is passed using the radiofrequency machine. This will numb the nerves.  A bandage (dressing) will be put on the insertion area after the procedure is done. The procedure may vary among health care providers and hospitals. What happens after the procedure?  Your blood pressure, heart rate, breathing rate, and blood oxygen level will be monitored often until the medicines you were given have worn off.  Return to your normal activities as directed by your health care provider. This information is not intended to replace advice given to you by your health care provider. Make sure you discuss any questions you have with your health care provider. Document Released:  09/17/2010 Document Revised: 06/27/2015 Document Reviewed: 02/26/2014 Elsevier Interactive Patient Education  2017 [place]  What are the risk, side effects and possible complications? Generally speaking, most procedures are safe.  However, with any procedure there are risks, side effects, and the possibility of complications.  The risks and complications are dependent upon the sites that are lesioned, or the type of nerve block to be performed.  The closer the procedure is to the spine, the more serious the risks are.  Great care is taken when placing the radio frequency needles, block needles or lesioning probes, but sometimes complications can occur. 1. Infection: Any time there is an injection through the skin, there is a risk of infection.  This is why sterile conditions are used for these blocks.  There are four possible types of infection. 1. Localized skin infection. 2. Central Nervous System Infection-This can be in the form of Meningitis,  which can be deadly. 3. Epidural Infections-This can be in the form of an epidural abscess, which can cause pressure inside of the spine, causing compression of the spinal cord with subsequent paralysis. This would require an emergency surgery to decompress, and there are no guarantees that the patient would recover from the paralysis. 4. Discitis-This is an infection of the intervertebral discs.  It occurs in about 1% of discography procedures.  It is difficult to treat and it may lead to surgery.        2. Pain: the needles have to go through skin and soft tissues, will cause soreness.       3. Damage to internal structures:  The nerves to be lesioned may be near blood vessels or    other nerves which can be potentially damaged.       4. Bleeding: Bleeding is more common if the patient is taking blood thinners such as  aspirin, Coumadin, Ticiid, Plavix, etc., or if he/she have some genetic predisposition  such as  hemophilia. Bleeding into the spinal canal can cause compression of the spinal  cord with subsequent paralysis.  This would require an emergency surgery to  decompress and there are no guarantees that the patient would recover from the  paralysis.       5. Pneumothorax:  Puncturing of a lung is a possibility, every time a needle is introduced in  the area of the chest or upper back.  Pneumothorax refers to free air around the  collapsed lung(s), inside of the thoracic cavity (chest cavity).  Another two possible  complications related to a similar event would include: Hemothorax and Chylothorax.   These are variations of the Pneumothorax, where instead of air around the collapsed  lung(s), you may have blood or chyle, respectively.       6. Spinal headaches: They may occur with any procedures in the area of the spine.       7. Persistent CSF (Cerebro-Spinal Fluid) leakage: This is a rare problem, but may occur  with prolonged intrathecal or epidural catheters either due to the formation of a fistulous  track or a dural tear.       8. Nerve damage: By working so close to the spinal cord, there is always a possibility of  nerve damage, which could be as serious as a permanent spinal cord injury with  paralysis.       9. Death:  Although rare, severe deadly allergic reactions known as "Anaphylactic  reaction" can occur to any of the medications used.      10. Worsening of the symptoms:  We can always make thing worse.  What are the chances of something like this happening? Chances of any of this occuring are extremely low.  By statistics, you have more of a chance of getting killed in a motor vehicle accident: while driving to the hospital than any of the above occurring .  Nevertheless, you should be aware that they are possibilities.  In general, it is similar to taking a shower.  Everybody knows that you can slip, hit your head and get killed.  Does that mean that you should not shower again?  Nevertheless  always keep in mind that statistics do not mean anything if you happen to be on the wrong side of them.  Even if a procedure has a 1 (one) in a 1,000,000 (million) chance of going wrong, it you happen to be that one..Also, keep in mind that by statistics,  you have more of a chance of having something go wrong when taking medications.  Who should not have this procedure? If you are on a blood thinning medication (e.g. Coumadin, Plavix, see list of "Blood Thinners"), or if you have an active infection going on, you should not have the procedure.  If you are taking any blood thinners, please inform your physician.  How should I prepare for this procedure?  Do not eat or drink anything at least six hours prior to the procedure.  Bring a driver with you .  It cannot be a taxi.  Come accompanied by an adult that can drive you back, and that is strong enough to help you if your legs get weak or numb from the local anesthetic.  Take all of your medicines the morning of the procedure with just enough water to swallow them.  If you have diabetes, make sure that you are scheduled to have your procedure done first thing in the morning, whenever possible.  If you have diabetes, take only half of your insulin dose and notify our nurse that you have done so as soon as you arrive at the clinic.  If you are diabetic, but only take blood sugar pills (oral hypoglycemic), then do not take them on the morning of your procedure.  You may take them after you have had the procedure.  Do not take aspirin or any aspirin-containing medications, at least eleven (11) days prior to the procedure.  They may prolong bleeding.  Wear loose fitting clothing that may be easy to take off and that you would not mind if it got stained with Betadine or blood.  Do not wear any jewelry or perfume  Remove any nail coloring.  It will interfere with some of our monitoring equipment.  NOTE: Remember that this is not meant to be  interpreted as a complete list of all possible complications.  Unforeseen problems may occur.  BLOOD THINNERS The following drugs contain aspirin or other products, which can cause increased bleeding during surgery and should not be taken for 2 weeks prior to and 1 week after surgery.  If you should need take something for relief of minor pain, you may take acetaminophen which is found in Tylenol,m Datril, Anacin-3 and Panadol. It is not blood thinner. The products listed below are.  Do not take any of the products listed below in addition to any listed on your instruction sheet.  A.P.C or A.P.C with Codeine Codeine Phosphate Capsules #3 Ibuprofen Ridaura  ABC compound Congesprin Imuran rimadil  Advil Cope Indocin Robaxisal  Alka-Seltzer Effervescent Pain Reliever and Antacid Coricidin or Coricidin-D  Indomethacin Rufen  Alka-Seltzer plus Cold Medicine Cosprin Ketoprofen S-A-C Tablets  Anacin Analgesic Tablets or Capsules Coumadin Korlgesic Salflex  Anacin Extra Strength Analgesic tablets or capsules CP-2 Tablets Lanoril Salicylate  Anaprox Cuprimine Capsules Levenox Salocol  Anexsia-D Dalteparin Magan Salsalate  Anodynos Darvon compound Magnesium Salicylate Sine-off  Ansaid Dasin Capsules Magsal Sodium Salicylate  Anturane Depen Capsules Marnal Soma  APF Arthritis pain formula Dewitt's Pills Measurin Stanback  Argesic Dia-Gesic Meclofenamic Sulfinpyrazone  Arthritis Bayer Timed Release Aspirin Diclofenac Meclomen Sulindac  Arthritis pain formula Anacin Dicumarol Medipren Supac  Analgesic (Safety coated) Arthralgen Diffunasal Mefanamic Suprofen  Arthritis Strength Bufferin Dihydrocodeine Mepro Compound Suprol  Arthropan liquid Dopirydamole Methcarbomol with Aspirin Synalgos  ASA tablets/Enseals Disalcid Micrainin Tagament  Ascriptin Doan's Midol Talwin  Ascriptin A/D Dolene Mobidin Tanderil  Ascriptin Extra Strength Dolobid Moblgesic Ticlid  Ascriptin with Codeine Doloprin  or Doloprin  with Codeine Momentum Tolectin  Asperbuf Duoprin Mono-gesic Trendar  Aspergum Duradyne Motrin or Motrin IB Triminicin  Aspirin plain, buffered or enteric coated Durasal Myochrisine Trigesic  Aspirin Suppositories Easprin Nalfon Trillsate  Aspirin with Codeine Ecotrin Regular or Extra Strength Naprosyn Uracel  Atromid-S Efficin Naproxen Ursinus  Auranofin Capsules Elmiron Neocylate Vanquish  Axotal Emagrin Norgesic Verin  Azathioprine Empirin or Empirin with Codeine Normiflo Vitamin E  Azolid Emprazil Nuprin Voltaren  Bayer Aspirin plain, buffered or children's or timed BC Tablets or powders Encaprin Orgaran Warfarin Sodium  Buff-a-Comp Enoxaparin Orudis Zorpin  Buff-a-Comp with Codeine Equegesic Os-Cal-Gesic   Buffaprin Excedrin plain, buffered or Extra Strength Oxalid   Bufferin Arthritis Strength Feldene Oxphenbutazone   Bufferin plain or Extra Strength Feldene Capsules Oxycodone with Aspirin   Bufferin with Codeine Fenoprofen Fenoprofen Pabalate or Pabalate-SF   Buffets II Flogesic Panagesic   Buffinol plain or Extra Strength Florinal or Florinal with Codeine Panwarfarin   Buf-Tabs Flurbiprofen Penicillamine   Butalbital Compound Four-way cold tablets Penicillin   Butazolidin Fragmin Pepto-Bismol   Carbenicillin Geminisyn Percodan   Carna Arthritis Reliever Geopen Persantine   Carprofen Gold's salt Persistin   Chloramphenicol Goody's Phenylbutazone   Chloromycetin Haltrain Piroxlcam   Clmetidine heparin Plaquenil   Cllnoril Hyco-pap Ponstel   Clofibrate Hydroxy chloroquine Propoxyphen         Before stopping any of these medications, be sure to consult the physician who ordered them.  Some, such as Coumadin (Warfarin) are ordered to prevent or treat serious conditions such as "deep thrombosis", "pumonary embolisms", and other heart problems.  The amount of time that you may need off of the medication may also vary with the medication and the reason for which you were taking it.   If you are taking any of these medications, please make sure you notify your pain physician before you undergo any procedures.         Radiofrequency Lesioning Radiofrequency lesioning is a procedure that is performed to relieve pain. The procedure is often used for back, neck, or arm pain. Radiofrequency lesioning involves the use of a machine that creates radio waves to make heat. During the procedure, the heat is applied to the nerve that carries the pain signal. The heat damages the nerve and interferes with the pain signal. Pain relief usually starts about 2 weeks after the procedure and lasts for 6 months to 1 year. Tell a health care provider about:  Any allergies you have.  All medicines you are taking, including vitamins, herbs, eye drops, creams, and over-the-counter medicines.  Any problems you or family members have had with anesthetic medicines.  Any blood disorders you have.  Any surgeries you have had.  Any medical conditions you have.  Whether you are pregnant or may be pregnant. What are the risks? Generally, this is a safe procedure. However, problems may occur, including:  Pain or soreness at the injection site.  Infection at the injection site.  Damage to nerves or blood vessels. What happens before the procedure?  Ask your health care provider about:  Changing or stopping your regular medicines. This is especially important if you are taking diabetes medicines or blood thinners.  Taking medicines such as aspirin and ibuprofen. These medicines can thin your blood. Do not take these medicines before your procedure if your health care provider instructs you not to.  Follow instructions from your health care provider about eating or drinking restrictions.  Plan to have someone take you home  after the procedure.  If you go home right after the procedure, plan to have someone with you for 24 hours. What happens during the procedure?  You will be given  one or more of the following:  A medicine to help you relax (sedative).  A medicine to numb the area (local anesthetic).  You will be awake during the procedure. You will need to be able to talk with the health care provider during the procedure.  With the help of a type of X-ray (fluoroscopy), the health care provider will insert a radiofrequency needle into the area to be treated.  Next, a wire that carries the radio waves (electrode) will be put through the radiofrequency needle. An electrical pulse will be sent through the electrode to verify the correct nerve. You will feel a tingling sensation, and you may have muscle twitching.  Then, the tissue that is around the needle tip will be heated by an electric current that is passed using the radiofrequency machine. This will numb the nerves.  A bandage (dressing) will be put on the insertion area after the procedure is done. The procedure may vary among health care providers and hospitals. What happens after the procedure?  Your blood pressure, heart rate, breathing rate, and blood oxygen level will be monitored often until the medicines you were given have worn off.  Return to your normal activities as directed by your health care provider. This information is not intended to replace advice given to you by your health care provider. Make sure you discuss any questions you have with your health care provider. Document Released: 09/17/2010 Document Revised: 06/27/2015 Document Reviewed: 02/26/2014 Elsevier Interactive Patient Education  2017 [place]. Pain Management Discharge Instructions  General Discharge Instructions :  If you need to reach your doctor call: Monday-Friday 8:00 am - 4:00 pm at [phone] or toll free [phone].  After clinic hours [phone] to have operator reach doctor.  Bring all of your medication bottles to all your appointments in the pain clinic.  To cancel or reschedule your appointment with  Pain Management please remember to call 24 hours in advance to avoid a fee.  Refer to the educational materials which you have been given on: General Risks, I had my Procedure. Discharge Instructions, Post Sedation.  Post Procedure Instructions:  The drugs you were given will stay in your system until tomorrow, so for the next 24 hours you should not drive, make any legal decisions or drink any alcoholic beverages.  You may eat anything you prefer, but it is better to start with liquids then soups and crackers, and gradually work up to solid foods.  Please notify your doctor immediately if you have any unusual bleeding, trouble breathing or pain that is not related to your normal pain.  Depending on the type of procedure that was done, some parts of your body may feel week and/or numb.  This usually clears up by tonight or the next day.  Walk with the use of an assistive device or accompanied by an adult for the 24 hours.  You may use ice on the affected area for the first 24 hours.  Put ice in a Ziploc bag and cover with a towel and place against area 15 minutes on 15 minutes off.  You may switch to heat after 24 hours.____________________________________________________________________________________________  Medication Rules  Applies to: All patients receiving prescriptions (written or electronic).  Pharmacy of record: Pharmacy where electronic prescriptions will be sent. If written prescriptions are taken  to a different pharmacy, please inform the nursing staff. The pharmacy listed in the electronic medical record should be the one where you would like electronic prescriptions to be sent.  Prescription refills: Only during scheduled appointments. Applies to both, written and electronic prescriptions.  NOTE: The following applies primarily to controlled substances (Opioid Pain Medications)  Patient's responsibilities: 1. Pain Pills: Bring all pain pills to every appointment (except  for procedure appointments). 2. Pill Bottles: Bring pills in original pharmacy bottle. Always bring newest bottle. Bring bottle, even if empty. 3. Medication refills: You are responsible for knowing and keeping track of what medications you need refilled. The day before your appointment, write a list of all prescriptions that need to be refilled. Bring that list to your appointment and give it to the admitting nurse. Prescriptions will be written only during appointments. If you forget a medication, it will not be "Called in", "Faxed", or "electronically sent". You will need to get another appointment to get these prescribed. 4. Prescription Accuracy: You are responsible for carefully inspecting your prescriptions before leaving our office. Have the discharge nurse carefully go over each prescription with you, before taking them home. Make sure that your name is accurately spelled, that your address is correct. Check the name and dose of your medication to make sure it is accurate. Check the number of pills, and the written instructions to make sure they are clear and accurate. Make sure that you are given enough medication to last until your next medication refill appointment. 5. Taking Medication: Take medication as prescribed. Never take more pills than instructed. Never take medication more frequently than prescribed. Taking less pills or less frequently is permitted and encouraged, when it comes to controlled substances (written prescriptions).  6. Inform other Doctors: Always inform, all of your healthcare providers, of all the medications you take. 7. Pain Medication from other Providers: You are not allowed to accept any additional pain medication from any other Doctor or Healthcare provider. There are two exceptions to this rule. (see below) In the event that you require additional pain medication, you are responsible for notifying us, as stated below. 8. Medication Agreement: You are responsible for  carefully reading and following our Medication Agreement. This must be signed before receiving any prescriptions from our practice. Safely store a copy of your signed Agreement. Violations to the Agreement will result in no further prescriptions. (Additional copies of our Medication Agreement are available upon request.) 9. Laws, Rules, & Regulations: All patients are expected to follow all Federal and [employer], Rules, [employer]. Ignorance of the Laws does not constitute a valid excuse.  Exceptions: There are only two exceptions to the rule of not receiving pain medications from other Healthcare Providers. 1. Exception #1 (Emergencies): In the event of an emergency (i.e.: accident requiring emergency care), you are allowed to receive additional pain medication. However, you are responsible for: As soon as you are able, call our office [phone], at any time of the day or night, and leave a message stating your name, the date and nature of the emergency, and the name and dose of the medication prescribed. In the event that your call is answered by a member of our staff, make sure to document and save the date, time, and the name of the person that took your information.  2. Exception #2 (Planned Surgery): In the event that you are scheduled by another doctor or dentist to have any type of surgery or procedure, you  are allowed (for a period no longer than 30 days), to receive additional pain medication, for the acute post-op pain. However, in this case, you are responsible for picking up a copy of our "Post-op Pain Management for Surgeons" handout, and giving it to your surgeon or dentist. This document is available at our office, and does not require an appointment to obtain it. Simply go to our office during business hours (Monday-Thursday from 8:00 AM to 4:00 PM) (Friday 8:00 AM to 12:00 Noon) or if you have a scheduled appointment with [place], prior to your surgery, and ask for it by name. In  addition, you will need to provide [place] with your name, name of your surgeon, type of surgery, and date of procedure or surgery.  _____________________________________________________________________________________________Radiofrequency Lesioning, Care After Refer to this sheet in the next few weeks. These instructions provide you with information about caring for yourself after your procedure. Your health care provider may also give you more specific instructions. Your treatment has been planned according to current medical practices, but problems sometimes occur. Call your health care provider if you have any problems or questions after your procedure. What can I expect after the procedure? After the procedure, it is common to have:  Pain from the burned nerve.  Temporary numbness. Follow these instructions at home:  Take over-the-counter and prescription medicines only as told by your health care provider.  Return to your normal activities as told by your health care provider. Ask your health care provider what activities are safe for you.  Pay close attention to how you feel after the procedure. If you start to have pain, write down when it hurts and how it feels. This will help you and your health care provider to know if you need an additional treatment.  Check your needle insertion site every day for signs of infection. Watch for:  Redness, swelling, or pain.  Fluid, blood, or pus.  Keep all follow-up visits as told by your health care provider. This is important. Contact a health care provider if:  Your pain does not get better.  You have redness, swelling, or pain at the needle insertion site.  You have fluid, blood, or pus coming from the needle insertion site.  You have a fever. Get help right away if:  You develop sudden, severe pain.  You develop numbness or tingling near the procedure site that does not go away. This information is not intended to replace advice  given to you by your health care provider. Make sure you discuss any questions you have with your health care provider. Document Released: 09/18/2010 Document Revised: 06/27/2015 Document Reviewed: 02/26/2014 Elsevier Interactive Patient Education  2017 [employer].

## 2016-06-09 ENCOUNTER — Telehealth: Payer: Self-pay

## 2016-06-09 NOTE — Telephone Encounter (Signed)
No answer, left message to callif needed. 

## 2016-06-22 ENCOUNTER — Telehealth: Payer: Self-pay | Admitting: Pain Medicine

## 2016-06-22 NOTE — Telephone Encounter (Signed)
Spoke with patient, she has taken additional oxycodone and is out of that and does not have an Rx to fill until Saturday 06/27/16.  She has 2 tablets of hydrocodone left.  States that the hydrocodone causes her a significant headache. Told her that we would discuss medications tomorrow at her appt time.  Patient verbalizes u/o information.

## 2016-06-22 NOTE — Telephone Encounter (Signed)
Has procedure scheduled Tomorrow for RF, does not have enough meds to get her thru, med refill due to fill on Saturday. She had really rough time after last RF. Please call to discuss.

## 2016-06-23 ENCOUNTER — Ambulatory Visit
Admission: RE | Admit: 2016-06-23 | Discharge: 2016-06-23 | Disposition: A | Discharge status: Home / Self Care | Payer: BLUE CROSS/BLUE SHIELD | Source: Ambulatory Visit | Attending: Pain Medicine | Admitting: Pain Medicine

## 2016-06-23 ENCOUNTER — Encounter: Payer: Self-pay | Admitting: Pain Medicine

## 2016-06-23 ENCOUNTER — Ambulatory Visit (HOSPITAL_BASED_OUTPATIENT_CLINIC_OR_DEPARTMENT_OTHER): Payer: BLUE CROSS/BLUE SHIELD | Admitting: Pain Medicine

## 2016-06-23 VITALS — BP 132/96 | HR 110 | Temp 96.9°F | Resp 18 | Ht 64.0 in | Wt 190.0 lb

## 2016-06-23 DIAGNOSIS — M545 Low back pain, unspecified: Secondary | ICD-10-CM

## 2016-06-23 DIAGNOSIS — G894 Chronic pain syndrome: Secondary | ICD-10-CM | POA: Diagnosis not present

## 2016-06-23 DIAGNOSIS — G8929 Other chronic pain: Secondary | ICD-10-CM

## 2016-06-23 DIAGNOSIS — M47816 Spondylosis without myelopathy or radiculopathy, lumbar region: Secondary | ICD-10-CM | POA: Diagnosis not present

## 2016-06-23 DIAGNOSIS — M4696 Unspecified inflammatory spondylopathy, lumbar region: Secondary | ICD-10-CM

## 2016-06-23 DIAGNOSIS — Z885 Allergy status to narcotic agent status: Secondary | ICD-10-CM | POA: Diagnosis not present

## 2016-06-23 DIAGNOSIS — M488X6 Other specified spondylopathies, lumbar region: Secondary | ICD-10-CM | POA: Diagnosis not present

## 2016-06-23 DIAGNOSIS — Z880 Allergy status to penicillin: Secondary | ICD-10-CM | POA: Insufficient documentation

## 2016-06-23 DIAGNOSIS — G8918 Other acute postprocedural pain: Secondary | ICD-10-CM

## 2016-06-23 MED ORDER — ROPIVACAINE HCL 2 MG/ML IJ SOLN
9.0000 mL | Freq: Once | INTRAMUSCULAR | Status: AC
  Administered 2016-06-23: 10 mL via PERINEURAL
  Filled 2016-06-23: qty 10

## 2016-06-23 MED ORDER — TRIAMCINOLONE ACETONIDE 40 MG/ML IJ SUSP
40.0000 mg | Freq: Once | INTRAMUSCULAR | Status: AC
  Administered 2016-06-23: 40 mg
  Filled 2016-06-23: qty 1

## 2016-06-23 MED ORDER — FENTANYL CITRATE (PF) 100 MCG/2ML IJ SOLN
25.0000 ug | INTRAMUSCULAR | Status: DC | PRN
  Administered 2016-06-23: 100 ug via INTRAVENOUS
  Filled 2016-06-23: qty 2

## 2016-06-23 MED ORDER — LIDOCAINE HCL (PF) 1 % IJ SOLN
10.0000 mL | Freq: Once | INTRAMUSCULAR | Status: AC
  Administered 2016-06-23: 5 mL
  Filled 2016-06-23: qty 10

## 2016-06-23 MED ORDER — LACTATED RINGERS IV SOLN
1000.0000 mL | Freq: Once | INTRAVENOUS | Status: AC
  Administered 2016-06-23: 1000 mL via INTRAVENOUS

## 2016-06-23 MED ORDER — OXYCODONE HCL 5 MG PO TABS
5.0000 mg | ORAL_TABLET | ORAL | 0 refills | Status: DC | PRN
Start: 2016-06-23 — End: 2016-07-21

## 2016-06-23 MED ORDER — HYDROCODONE-ACETAMINOPHEN 5-325 MG PO TABS: 1.0000 | ORAL_TABLET | Freq: Three times a day (TID) | ORAL | 0 refills | Status: DC | PRN

## 2016-06-23 MED ORDER — MIDAZOLAM HCL 5 MG/5ML IJ SOLN
1.0000 mg | INTRAMUSCULAR | Status: DC | PRN
  Administered 2016-06-23: 3 mg via INTRAVENOUS
  Filled 2016-06-23: qty 5

## 2016-06-23 MED ORDER — ROPIVACAINE HCL 5 MG/ML IJ SOLN
5.0000 mL | Freq: Once | INTRAMUSCULAR | Status: DC
  Filled 2016-06-23: qty 5

## 2016-06-23 NOTE — Progress Notes (Signed)
Nursing Pain Medication Assessment:  Safety precautions to be maintained throughout the outpatient stay will include: orient to surroundings, keep bed in low position, maintain call bell within reach at all times, provide assistance with transfer out of bed and ambulation.  Medication Inspection Compliance: Pill count conducted under aseptic conditions, in front of the patient. Neither the pills nor the bottle was removed from the patient's sight at any time. Once count was completed pills were immediately returned to the patient in their original bottle.  Medication #1: Hydrocodone/APAP Pill/Patch Count: 0 of 30 pills remain Pill/Patch Appearance: Markings consistent with prescribed medication Bottle Appearance: Standard pharmacy container. Clearly labeled. Filled Date: 05 / 07 / 2018 Last Medication intake:  Yesterday  Medication #2: Oxycodone IR Pill/Patch Count: 0 of 120 pills remain Pill/Patch Appearance: Markings consistent with prescribed medication Bottle Appearance: Standard pharmacy container. Clearly labeled. Filled Date: 04 / 26 / 2018 Last Medication intake:  06/20/2016

## 2016-06-23 NOTE — Patient Instructions (Addendum)
Post-Procedure instructions Instructions:  Apply ice: Fill a plastic sandwich bag with crushed ice. Cover it with a small towel and apply to injection site. Apply for 15 minutes then remove x 15 minutes. Repeat sequence on day of procedure, until you go to bed. The purpose is to minimize swelling and discomfort after procedure.  Apply heat: Apply heat to procedure site starting the day following the procedure. The purpose is to treat any soreness and discomfort from the procedure.  Food intake: Start with clear liquids (like water) and advance to regular food, as tolerated.   Physical activities: Keep activities to a minimum for the first 8 hours after the procedure.   Driving: If you have received any sedation, you are not allowed to drive for 24 hours after your procedure.  Blood thinner: Restart your blood thinner 6 hours after your procedure. (Only for those taking blood thinners)  Insulin: As soon as you can eat, you may resume your normal dosing schedule. (Only for those taking insulin)  Infection prevention: Keep procedure site clean and dry.  Post-procedure Pain Diary: Extremely important that this be done correctly and accurately. Recorded information will be used to determine the next step in treatment.  Pain evaluated is that of treated area only. Do not include pain from an untreated area.  Complete every hour, on the hour, for the initial 8 hours. Set an alarm to help you do this part accurately.  Do not go to sleep and have it completed later. It will not be accurate.  Follow-up appointment: Keep your follow-up appointment after the procedure. Usually 2 weeks for most procedures. (6 weeks in the case of radiofrequency.) Bring you pain diary.  Expect:  From numbing medicine (AKA: Local Anesthetics): Numbness or decrease in pain.  Onset: Full effect within 15 minutes of injected.  Duration: It will depend on the type of local anesthetic used. On the average, 1 to 8  hours.   From steroids: Decrease in swelling or inflammation. Once inflammation is improved, relief of the pain will follow.  Onset of benefits: Depends on the amount of swelling present. The more swelling, the longer it will take for the benefits to be seen.   Duration: Steroids will stay in the system x 2 weeks. Duration of benefits will depend on multiple posibilities including persistent irritating factors.  From procedure: Some discomfort is to be expected once the numbing medicine wears off. This should be minimal if ice and heat are applied as instructed. Call if:  You experience numbness and weakness that gets worse with time, as opposed to wearing off.  New onset bowel or bladder incontinence. (Spinal procedures only)  Emergency Numbers:  Durning business hours (Monday - Thursday, 8:00 AM - 4:00 PM) (Friday, 9:00 AM - 12:00 Noon): [phone]  After hours: [phone] _____________________________________________________________________________________________  ____________________________________________________________________________________________  Medication Rules  Applies to: All patients receiving prescriptions (written or electronic).  Pharmacy of record: Pharmacy where electronic prescriptions will be sent. If written prescriptions are taken to a different pharmacy, please inform the nursing staff. The pharmacy listed in the electronic medical record should be the one where you would like electronic prescriptions to be sent.  Prescription refills: Only during scheduled appointments. Applies to both, written and electronic prescriptions.  NOTE: The following applies primarily to controlled substances (Opioid Pain Medications)  Patient's responsibilities: 1. Pain Pills: Bring all pain pills to every appointment (except for procedure appointments). 2. Pill Bottles: Bring pills in original pharmacy bottle. Always bring newest   bottle. Bring bottle, even if  empty. 3. Medication refills: You are responsible for knowing and keeping track of what medications you need refilled. The day before your appointment, write a list of all prescriptions that need to be refilled. Bring that list to your appointment and give it to the admitting nurse. Prescriptions will be written only during appointments. If you forget a medication, it will not be "Called in", "Faxed", or "electronically sent". You will need to get another appointment to get these prescribed. 4. Prescription Accuracy: You are responsible for carefully inspecting your prescriptions before leaving our office. Have the discharge nurse carefully go over each prescription with you, before taking them home. Make sure that your name is accurately spelled, that your address is correct. Check the name and dose of your medication to make sure it is accurate. Check the number of pills, and the written instructions to make sure they are clear and accurate. Make sure that you are given enough medication to last until your next medication refill appointment. 5. Taking Medication: Take medication as prescribed. Never take more pills than instructed. Never take medication more frequently than prescribed. Taking less pills or less frequently is permitted and encouraged, when it comes to controlled substances (written prescriptions).  6. Inform other Doctors: Always inform, all of your healthcare providers, of all the medications you take. 7. Pain Medication from other Providers: You are not allowed to accept any additional pain medication from any other Doctor or Healthcare provider. There are two exceptions to this rule. (see below) In the event that you require additional pain medication, you are responsible for notifying us, as stated below. 8. Medication Agreement: You are responsible for carefully reading and following our Medication Agreement. This must be signed before receiving any prescriptions from our practice.  Safely store a copy of your signed Agreement. Violations to the Agreement will result in no further prescriptions. (Additional copies of our Medication Agreement are available upon request.) 9. Laws, Rules, & Regulations: All patients are expected to follow all Federal and [employer], Rules, [employer]. Ignorance of the Laws does not constitute a valid excuse.  Exceptions: There are only two exceptions to the rule of not receiving pain medications from other Healthcare Providers. 1. Exception #1 (Emergencies): In the event of an emergency (i.e.: accident requiring emergency care), you are allowed to receive additional pain medication. However, you are responsible for: As soon as you are able, call our office [phone], at any time of the day or night, and leave a message stating your name, the date and nature of the emergency, and the name and dose of the medication prescribed. In the event that your call is answered by a member of our staff, make sure to document and save the date, time, and the name of the person that took your information.  2. Exception #2 (Planned Surgery): In the event that you are scheduled by another doctor or dentist to have any type of surgery or procedure, you are allowed (for a period no longer than 30 days), to receive additional pain medication, for the acute post-op pain. However, in this case, you are responsible for picking up a copy of our "Post-op Pain Management for Surgeons" handout, and giving it to your surgeon or dentist. This document is available at our office, and does not require an appointment to obtain it. Simply go to our office during business hours (Monday-Thursday from 8:00 AM to 4:00 PM) (Friday 8:00 AM to 12:00 Noon)  or if you have a scheduled appointment with [place], prior to your surgery, and ask for it by name. In addition, you will need to provide [place] with your name, name of your surgeon, type of surgery, and date of procedure or  surgery.  _____________________________________________________________________________________________  ____________________________________________________________________________________________  Appointment Policy  It is our goal and responsibility to provide the medical community with assistance in the evaluation and management of patients with chronic pain. Unfortunately our resources are limited. Because we do not have an unlimited amount of time, or available appointments, we are required to closely monitor and manage their use. The following rules exist to maximize their use:  Patient's responsibilities: [place]: You are required to be physically present and registered in our facility at least 30 minutes before your appointment. 11. Tardiness: The cutoff is your appointment time. If you have an appointment scheduled for 10:00 AM and you arrive at 10:01, you will be required to reschedule your appointment.  12. Plan ahead: Always assume that you will encounter traffic on your way in. Plan for it. If you are dependent on a driver, make sure they understand these rules and the need to arrive early. 13. Other appointments and responsibilities: Avoid scheduling any other appointments before or after your pain clinic appointments.  14. Be prepared: Write down everything that you need to discuss with your healthcare provider and give this information to the admitting nurse. Write down the medications that you will need refilled. Bring your pills and bottles (even the empty ones), to all of your appointments, except for those where a procedure is scheduled. 15. No children or pets: Find someone to take care of them. It is not appropriate to bring them in. 16. Scheduling changes: We request "advanced notification" of any changes or cancellations. 17. Advanced notification: Defined as a time period of more than 24 hours prior to the originally scheduled appointment. This allows for the  appointment to be offered to other patients. 18. Rescheduling: When a visit is rescheduled, it will require the cancellation of the original appointment. For this reason they both fall within the category of "Cancellations".  19. Cancellations: They require advanced notification. Any cancellation less than 24 hours before the  appointment will be recorded as a "No Show". 20. No Show: Defined as an unkept appointment where the patient failed to notify or declare to the practice their intention or inability to keep the appointment.  Corrective process for repeat offenders:  3. Tardiness: Three (3) episodes of rescheduling due to late arrivals will be recorded as one (1) "No Show". 4. Cancellation or reschedule: Three (3) cancellations or rescheduling will be recorded as one (1) "No Show". 5. "No Shows": Three (3) "No Shows" within a 12 month period will result in discharge from the practice.  _____________________________________________________________________________________________   Radiofrequency Lesioning Radiofrequency lesioning is a procedure that is performed to relieve pain. The procedure is often used for back, neck, or arm pain. Radiofrequency lesioning involves the use of a machine that creates radio waves to make heat. During the procedure, the heat is applied to the nerve that carries the pain signal. The heat damages the nerve and interferes with the pain signal. Pain relief usually starts about 2 weeks after the procedure and lasts for 6 months to 1 year. Tell a health care provider about:  Any allergies you have.  All medicines you are taking, including vitamins, herbs, eye drops, creams, and over-the-counter medicines.  Any problems you or family members have had with  anesthetic medicines.  Any blood disorders you have.  Any surgeries you have had.  Any medical conditions you have.  Whether you are pregnant or may be pregnant. What are the risks? Generally, this is a  safe procedure. However, problems may occur, including:  Pain or soreness at the injection site.  Infection at the injection site.  Damage to nerves or blood vessels. What happens before the procedure?  Ask your health care provider about:  Changing or stopping your regular medicines. This is especially important if you are taking diabetes medicines or blood thinners.  Taking medicines such as aspirin and ibuprofen. These medicines can thin your blood. Do not take these medicines before your procedure if your health care provider instructs you not to.  Follow instructions from your health care provider about eating or drinking restrictions.  Plan to have someone take you home after the procedure.  If you go home right after the procedure, plan to have someone with you for 24 hours. What happens during the procedure?  You will be given one or more of the following:  A medicine to help you relax (sedative).  A medicine to numb the area (local anesthetic).  You will be awake during the procedure. You will need to be able to talk with the health care provider during the procedure.  With the help of a type of X-ray (fluoroscopy), the health care provider will insert a radiofrequency needle into the area to be treated.  Next, a wire that carries the radio waves (electrode) will be put through the radiofrequency needle. An electrical pulse will be sent through the electrode to verify the correct nerve. You will feel a tingling sensation, and you may have muscle twitching.  Then, the tissue that is around the needle tip will be heated by an electric current that is passed using the radiofrequency machine. This will numb the nerves.  A bandage (dressing) will be put on the insertion area after the procedure is done. The procedure may vary among health care providers and hospitals. What happens after the procedure?  Your blood pressure, heart rate, breathing rate, and blood oxygen level  will be monitored often until the medicines you were given have worn off.  Return to your normal activities as directed by your health care provider. This information is not intended to replace advice given to you by your health care provider. Make sure you discuss any questions you have with your health care provider. Document Released: 09/17/2010 Document Revised: 06/27/2015 Document Reviewed: 02/26/2014 Elsevier Interactive Patient Education  2017 [place]. Pain Management Discharge Instructions  General Discharge Instructions :  If you need to reach your doctor call: Monday-Friday 8:00 am - 4:00 pm at [phone] or toll free [phone].  After clinic hours [phone] to have operator reach doctor.  Bring all of your medication bottles to all your appointments in the pain clinic.  To cancel or reschedule your appointment with Pain Management please remember to call 24 hours in advance to avoid a fee.  Refer to the educational materials which you have been given on: General Risks, I had my Procedure. Discharge Instructions, Post Sedation.  Post Procedure Instructions:  The drugs you were given will stay in your system until tomorrow, so for the next 24 hours you should not drive, make any legal decisions or drink any alcoholic beverages.  You may eat anything you prefer, but it is better to start with liquids then soups and crackers, and gradually work up to  solid foods.  Please notify your doctor immediately if you have any unusual bleeding, trouble breathing or pain that is not related to your normal pain.  Depending on the type of procedure that was done, some parts of your body may feel week and/or numb.  This usually clears up by tonight or the next day.  Walk with the use of an assistive device or accompanied by an adult for the 24 hours.  You may use ice on the affected area for the first 24 hours.  Put ice in a Ziploc bag and cover with a towel and place against  area 15 minutes on 15 minutes off.  You may switch to heat after 24 hours.

## 2016-06-23 NOTE — Progress Notes (Signed)
Patient's Name: Kelly Rollins  MRN: [id number]  Referring Provider: Leonel Ramsay, MD  DOB: Dec 07, 1970  PCP: Leonel Ramsay, MD  DOS: 06/23/2016  Note by: Kathlen Brunswick. Dossie Arbour, MD  Service setting: Ambulatory outpatient  Location: [facility] (AMB) Pain Management Facility  Visit type: Procedure  Specialty: Interventional Pain Management  Patient type: Established   Primary Reason for Visit: Interventional Pain Management Treatment. CC: Back Pain (low) and Hip Pain (left but has improved)  Procedure:  Anesthesia, Analgesia, Anxiolysis:  Type: Therapeutic Medial Branch Facet Radiofrequency Ablation Region: Lumbar Level: L2, L3, L4, L5, & S1 Medial Branch Level(s) Laterality: Right-Sided  Type: Local Anesthesia with Moderate (Conscious) Sedation Local Anesthetic: Lidocaine 1% Route: Intravenous (IV) IV Access: Secured Sedation: Meaningful verbal contact was maintained at all times during the procedure  Indication(s): Analgesia and Anxiety  Indications: 1. Lumbar facet syndrome (Location of Primary Source of Pain) (Bilateral) (L>R)   2. Chronic low back pain (Location of Primary Source of Pain) (Bilateral) (L>R)   3. Lumbar spondylosis   4. Acute postoperative pain   5. Chronic pain syndrome    Kelly Rollins has either failed to respond, was unable to tolerate, or simply did not get enough benefit from other more conservative therapies including, but not limited to: 1. Over-the-counter medications 2. Anti-inflammatory medications 3. Muscle relaxants 4. Membrane stabilizers 5. Opioids 6. Physical therapy 7. Modalities (Heat, ice, etc.) 8. Invasive techniques such as nerve blocks. Kelly Rollins has attained more than 50% relief of the pain from a series of diagnostic injections conducted in separate occasions.  Pain Score: Pre-procedure: 3 /10 Post-procedure: 2 /10  Pre-op Assessment:  Previous date of service: 06/08/16 Service provided: Procedure (left lumbar RF) [name]  is a 46 y.o. (year old), female patient, seen today for interventional treatment. She  has a past surgical history that includes Cesarean section; Vaginal hysterectomy; Cholecystectomy; and Breast reduction surgery. Her primarily concern today is the Back Pain (low) and Hip Pain (left but has improved)  Initial Vital Signs: Blood pressure (!) 153/97, pulse (!) 110, temperature (!) 96.9 F (36.1 C), temperature source Oral, resp. rate 16, height 5\' 4"  (1.626 m), weight 190 lb (86.2 kg), SpO2 100 %. BMI: 32.61 kg/m  Risk Assessment: Allergies: Reviewed. She is allergic to morphine; penicillins; and tramadol hcl.  Allergy Precautions: None required Coagulopathies: Reviewed. None identified.  Blood-thinner therapy: None at this time Active Infection(s): Reviewed. None identified. Kelly Rollins is afebrile  Site Confirmation: Kelly Rollins was asked to confirm the procedure and laterality before marking the site Procedure checklist: Completed Consent: Before the procedure and under the influence of no sedative(s), amnesic(s), or anxiolytics, the patient was informed of the treatment options, risks and possible complications. To fulfill our ethical and legal obligations, as recommended by the American Medical Association's Code of Ethics, I have informed the patient of my clinical impression; the nature and purpose of the treatment or procedure; the risks, benefits, and possible complications of the intervention; the alternatives, including doing nothing; the risk(s) and benefit(s) of the alternative treatment(s) or procedure(s); and the risk(s) and benefit(s) of doing nothing. The patient was provided information about the general risks and possible complications associated with the procedure. These may include, but are not limited to: failure to achieve desired goals, infection, bleeding, organ or nerve damage, allergic reactions, paralysis, and death. In addition, the patient was informed of those risks  and complications associated to Spine-related procedures, such as failure to decrease pain; infection (i.e.: Meningitis,  epidural or intraspinal abscess); bleeding (i.e.: epidural hematoma, subarachnoid hemorrhage, or any other type of intraspinal or peri-dural bleeding); organ or nerve damage (i.e.: Any type of peripheral nerve, nerve root, or spinal cord injury) with subsequent damage to sensory, motor, and/or autonomic systems, resulting in permanent pain, numbness, and/or weakness of one or several areas of the body; allergic reactions; (i.e.: anaphylactic reaction); and/or death. Furthermore, the patient was informed of those risks and complications associated with the medications. These include, but are not limited to: allergic reactions (i.e.: anaphylactic or anaphylactoid reaction(s)); adrenal axis suppression; blood sugar elevation that in diabetics may result in ketoacidosis or comma; water retention that in patients with history of congestive heart failure may result in shortness of breath, pulmonary edema, and decompensation with resultant heart failure; weight gain; swelling or edema; medication-induced neural toxicity; particulate matter embolism and blood vessel occlusion with resultant organ, and/or nervous system infarction; and/or aseptic necrosis of one or more joints. Finally, the patient was informed that Medicine is not an exact science; therefore, there is also the possibility of unforeseen or unpredictable risks and/or possible complications that may result in a catastrophic outcome. The patient indicated having understood very clearly. We have given the patient no guarantees and we have made no promises. Enough time was given to the patient to ask questions, all of which were answered to the patient's satisfaction. Kelly Rollins has indicated that she wanted to continue with the procedure. Attestation: I, the ordering provider, attest that I have discussed with the patient the benefits,  risks, side-effects, alternatives, likelihood of achieving goals, and potential problems during recovery for the procedure that I have provided informed consent. Date: 06/23/2016; Time: 8:36 AM  Pre-Procedure Preparation:  Monitoring: As per clinic protocol. Respiration, ETCO2, SpO2, BP, heart rate and rhythm monitor placed and checked for adequate function Safety Precautions: Patient was assessed for positional comfort and pressure points before starting the procedure. Time-out: I initiated and conducted the "Time-out" before starting the procedure, as per protocol. The patient was asked to participate by confirming the accuracy of the "Time Out" information. Verification of the correct person, site, and procedure were performed and confirmed by me, the nursing staff, and the patient. "Time-out" conducted as per Joint Commission's Universal Protocol (UP.01.01.01). "Time-out" Date & Time: 06/23/2016; 0922 hrs.  Description of Procedure Process:   Position: Prone Target Area: For Lumbar Facet blocks, the target is the groove formed by the junction of the transverse process and superior articular process. For the L5 dorsal ramus, the target is the notch between superior articular process and sacral ala. For the S1 dorsal ramus, the target is the superior and lateral edge of the posterior S1 Sacral foramen. Approach: Paraspinal approach. Area Prepped: Entire Posterior Lumbosacral Region Prepping solution: Hibiclens (4.0% Chlorhexidine gluconate solution) Safety Precautions: Aspiration looking for blood return was conducted prior to all injections. At no point did we inject any substances, as a needle was being advanced. No attempts were made at seeking any paresthesias. Safe injection practices and needle disposal techniques used. Medications properly checked for expiration dates. SDV (single dose vial) medications used. Description of the Procedure: Protocol guidelines were followed. The patient was  placed in position over the fluoroscopy table. The target area was identified and the area prepped in the usual manner. Skin desensitized using vapocoolant spray. Skin & deeper tissues infiltrated with local anesthetic. Appropriate amount of time allowed to pass for local anesthetics to take effect. Radiofrequency needles were introduced to the area of the  medial branch at the junction of the superior articular process and transverse process using fluoroscopy. Using the [employer], sensory stimulation using 50 Hz was used to locate & identify the nerve, making sure that the needle was positioned such that there was no sensory stimulation below 0.3 V or above 0.7 V. Stimulation using 2 Hz was used to evaluate the motor component. Care was taken not to lesion any nerves that demonstrated motor stimulation of the lower extremities at an output of less than 2.5 times that of the sensory threshold, or a maximum of 2.0 V. Once satisfactory placement of the needles was achieved, the above solution was slowly injected after negative aspiration. After waiting for at least 2 minutes, the ablation was performed at 80 degrees C for 60 seconds.The needles were then removed and the area cleansed, making sure to leave some of the prepping solution back to take advantage of its long term bactericidal properties. Vitals:   06/23/16 0947 06/23/16 0957 06/23/16 1007 06/23/16 1019  BP: (!) 141/98 122/85 (!) 138/100 (!) 132/96  Pulse:      Resp: 14 (!) 23 (!) 22 18  Temp:      TempSrc:      SpO2: 98% 99% 99% 99%  Weight:      Height:        Start Time: 0922 hrs. End Time: 0946 hrs. Materials & Medications:  Needle(s) Type: Teflon-coated, curved tip, Radiofrequency needle(s) Gauge: 22G Length: 10cm Medication(s): We administered lactated ringers, midazolam, fentaNYL, triamcinolone acetonide, lidocaine (PF), and ropivacaine (PF) 2 mg/mL (0.2%). Please see chart orders for dosing details.  Imaging  Guidance (Spinal):  Type of Imaging Technique: Fluoroscopy Guidance (Spinal) Indication(s): Assistance in needle guidance and placement for procedures requiring needle placement in or near specific anatomical locations not easily accessible without such assistance. Exposure Time: Please see nurses notes. Contrast: None used. Fluoroscopic Guidance: I was personally present during the use of fluoroscopy. "Tunnel Vision Technique" used to obtain the best possible view of the target area. Parallax error corrected before commencing the procedure. "Direction-depth-direction" technique used to introduce the needle under continuous pulsed fluoroscopy. Once target was reached, antero-posterior, oblique, and lateral fluoroscopic projection used confirm needle placement in all planes. Images permanently stored in EMR. Interpretation: No contrast injected. I personally interpreted the imaging intraoperatively. Adequate needle placement confirmed in multiple planes. Permanent images saved into the patient's record.  Antibiotic Prophylaxis:  Indication(s): None identified Antibiotic given: None  Post-operative Assessment:  EBL: None Complications: No immediate post-treatment complications observed by team, or reported by patient. Note: The patient tolerated the entire procedure well. A repeat set of vitals were taken after the procedure and the patient was kept under observation following institutional policy, for this type of procedure. Post-procedural neurological assessment was performed, showing return to baseline, prior to discharge. The patient was provided with post-procedure discharge instructions, including a section on how to identify potential problems. Should any problems arise concerning this procedure, the patient was given instructions to immediately contact us, at any time, without hesitation. In any case, we plan to contact the patient by telephone for a follow-up status report regarding this  interventional procedure. Comments:  No additional relevant information.  Plan of Care  Disposition: Discharge home  Discharge Date & Time: 06/23/2016; 1020 hrs.  Physician-requested Follow-up:  Return in 6 weeks (on 08/03/2016) for post-RF eval, w/ MD.  Future Appointments Date Time Provider [place]  07/21/2016 8:00 AM Vevelyn Francois, NP [facility] None  08/04/2016  9:00 AM Milinda Pointer, MD [facility] None   Medications ordered for procedure: Meds ordered this encounter  Medications  . lactated ringers infusion 1,000 mL  . midazolam (VERSED) 5 MG/5ML injection 1-2 mg    Make sure Flumazenil is available in the pyxis when using this medication. If oversedation occurs, administer 0.2 mg IV over 15 sec. If after 45 sec no response, administer 0.2 mg again over 1 min; may repeat at 1 min intervals; not to exceed 4 doses (1 mg)  . fentaNYL (SUBLIMAZE) injection 25-50 mcg    Make sure Narcan is available in the pyxis when using this medication. In the event of respiratory depression (RR< 8/min): Titrate NARCAN (naloxone) in increments of 0.1 to 0.2 mg IV at 2-3 minute intervals, until desired degree of reversal.  . triamcinolone acetonide (KENALOG-40) injection 40 mg  . lidocaine (PF) (XYLOCAINE) 1 % injection 10 mL  . ropivacaine (PF) 2 mg/mL (0.2%) (NAROPIN) injection 9 mL    Preservative-free (MPF), single use vial.  . oxyCODONE (OXY IR/ROXICODONE) 5 MG immediate release tablet    Sig: Take 1 tablet (5 mg total) by mouth every 4 (four) hours as needed for severe pain.    Dispense:  180 tablet - This increase should not be continued. This is only for a post-radiofrequency pain.     Refill:  0    Fill one day early if pharmacy is closed on scheduled refill date. Do not fill until: 06/23/16 To last until: 07/23/16   Medications administered: We administered lactated ringers, midazolam, fentaNYL, triamcinolone acetonide, lidocaine (PF), and ropivacaine (PF) 2 mg/mL (0.2%).  See  the medical record for exact dosing, route, and time of administration.  Lab-work, Procedure(s), & Referral(s) Ordered: Orders Placed This Encounter  Procedures  . DG C-Arm 1-60 Min-No Report  . Informed Consent Details: Transcribe to consent form and obtain patient signature  . Provider attestation of informed consent for procedure/surgical case  . Verify informed consent  . Discharge instructions  . Follow-up   Imaging Ordered: Results for orders placed in visit on 06/08/16  DG C-Arm 1-60 Min-No Report   Narrative Fluoroscopy was utilized by the requesting physician.  No radiographic  interpretation.    New Prescriptions   No medications on file   Primary Care Physician: Leonel Ramsay, MD Location: [facility] Note by: Kathlen Brunswick. Dossie Arbour, M.D, DABA, DABAPM, DABPM, DABIPP, FIPP Date: 06/23/2016; Time: 11:37 AM  Disclaimer:  Medicine is not an exact science. The only guarantee in medicine is that nothing is guaranteed. It is important to note that the decision to proceed with this intervention was based on the information collected from the patient. The Data and conclusions were drawn from the patient's questionnaire, the interview, and the physical examination. Because the information was provided in large part by the patient, it cannot be guaranteed that it has not been purposely or unconsciously manipulated. Every effort has been made to obtain as much relevant data as possible for this evaluation. It is important to note that the conclusions that lead to this procedure are derived in large part from the available data. Always take into account that the treatment will also be dependent on availability of resources and existing treatment guidelines, considered by other Pain Management Practitioners as being common knowledge and practice, at the time of the intervention. For Medico-Legal purposes, it is also important to point out that variation in  procedural techniques and pharmacological choices are the acceptable norm. The indications,  contraindications, technique, and results of the above procedure should only be interpreted and judged by a Board-Certified Interventional Pain Specialist with extensive familiarity and expertise in the same exact procedure and technique.  Instructions provided at this appointment: Patient Instructions  Post-Procedure instructions Instructions:  Apply ice: Fill a plastic sandwich bag with crushed ice. Cover it with a small towel and apply to injection site. Apply for 15 minutes then remove x 15 minutes. Repeat sequence on day of procedure, until you go to bed. The purpose is to minimize swelling and discomfort after procedure.  Apply heat: Apply heat to procedure site starting the day following the procedure. The purpose is to treat any soreness and discomfort from the procedure.  Food intake: Start with clear liquids (like water) and advance to regular food, as tolerated.   Physical activities: Keep activities to a minimum for the first 8 hours after the procedure.   Driving: If you have received any sedation, you are not allowed to drive for 24 hours after your procedure.  Blood thinner: Restart your blood thinner 6 hours after your procedure. (Only for those taking blood thinners)  Insulin: As soon as you can eat, you may resume your normal dosing schedule. (Only for those taking insulin)  Infection prevention: Keep procedure site clean and dry.  Post-procedure Pain Diary: Extremely important that this be done correctly and accurately. Recorded information will be used to determine the next step in treatment.  Pain evaluated is that of treated area only. Do not include pain from an untreated area.  Complete every hour, on the hour, for the initial 8 hours. Set an alarm to help you do this part accurately.  Do not go to sleep and have it completed later. It will not be accurate.  Follow-up  appointment: Keep your follow-up appointment after the procedure. Usually 2 weeks for most procedures. (6 weeks in the case of radiofrequency.) Bring you pain diary.  Expect:  From numbing medicine (AKA: Local Anesthetics): Numbness or decrease in pain.  Onset: Full effect within 15 minutes of injected.  Duration: It will depend on the type of local anesthetic used. On the average, 1 to 8 hours.   From steroids: Decrease in swelling or inflammation. Once inflammation is improved, relief of the pain will follow.  Onset of benefits: Depends on the amount of swelling present. The more swelling, the longer it will take for the benefits to be seen.   Duration: Steroids will stay in the system x 2 weeks. Duration of benefits will depend on multiple posibilities including persistent irritating factors.  From procedure: Some discomfort is to be expected once the numbing medicine wears off. This should be minimal if ice and heat are applied as instructed. Call if:  You experience numbness and weakness that gets worse with time, as opposed to wearing off.  New onset bowel or bladder incontinence. (Spinal procedures only)  Emergency Numbers:  [place] business hours (Monday - Thursday, 8:00 AM - 4:00 PM) (Friday, 9:00 AM - 12:00 Noon): [phone]  After hours: [phone] _____________________________________________________________________________________________  ____________________________________________________________________________________________  Medication Rules  Applies to: All patients receiving prescriptions (written or electronic).  Pharmacy of record: Pharmacy where electronic prescriptions will be sent. If written prescriptions are taken to a different pharmacy, please inform the nursing staff. The pharmacy listed in the electronic medical record should be the one where you would like electronic prescriptions to be sent.  Prescription refills: Only during scheduled  appointments. Applies to both, written and electronic  prescriptions.  NOTE: The following applies primarily to controlled substances (Opioid Pain Medications)  Patient's responsibilities: 1. Pain Pills: Bring all pain pills to every appointment (except for procedure appointments). 2. Pill Bottles: Bring pills in original pharmacy bottle. Always bring newest bottle. Bring bottle, even if empty. 3. Medication refills: You are responsible for knowing and keeping track of what medications you need refilled. The day before your appointment, write a list of all prescriptions that need to be refilled. Bring that list to your appointment and give it to the admitting nurse. Prescriptions will be written only during appointments. If you forget a medication, it will not be "Called in", "Faxed", or "electronically sent". You will need to get another appointment to get these prescribed. 4. Prescription Accuracy: You are responsible for carefully inspecting your prescriptions before leaving our office. Have the discharge nurse carefully go over each prescription with you, before taking them home. Make sure that your name is accurately spelled, that your address is correct. Check the name and dose of your medication to make sure it is accurate. Check the number of pills, and the written instructions to make sure they are clear and accurate. Make sure that you are given enough medication to last until your next medication refill appointment. 5. Taking Medication: Take medication as prescribed. Never take more pills than instructed. Never take medication more frequently than prescribed. Taking less pills or less frequently is permitted and encouraged, when it comes to controlled substances (written prescriptions).  6. Inform other Doctors: Always inform, all of your healthcare providers, of all the medications you take. 7. Pain Medication from other Providers: You are not allowed to accept any additional pain medication  from any other Doctor or Healthcare provider. There are two exceptions to this rule. (see below) In the event that you require additional pain medication, you are responsible for notifying us, as stated below. 8. Medication Agreement: You are responsible for carefully reading and following our Medication Agreement. This must be signed before receiving any prescriptions from our practice. Safely store a copy of your signed Agreement. Violations to the Agreement will result in no further prescriptions. (Additional copies of our Medication Agreement are available upon request.) 9. Laws, Rules, & Regulations: All patients are expected to follow all Federal and [employer], Rules, [employer]. Ignorance of the Laws does not constitute a valid excuse.  Exceptions: There are only two exceptions to the rule of not receiving pain medications from other Healthcare Providers. 1. Exception #1 (Emergencies): In the event of an emergency (i.e.: accident requiring emergency care), you are allowed to receive additional pain medication. However, you are responsible for: As soon as you are able, call our office [phone], at any time of the day or night, and leave a message stating your name, the date and nature of the emergency, and the name and dose of the medication prescribed. In the event that your call is answered by a member of our staff, make sure to document and save the date, time, and the name of the person that took your information.  2. Exception #2 (Planned Surgery): In the event that you are scheduled by another doctor or dentist to have any type of surgery or procedure, you are allowed (for a period no longer than 30 days), to receive additional pain medication, for the acute post-op pain. However, in this case, you are responsible for picking up a copy of our "Post-op Pain Management for Surgeons" handout, and giving it to  your surgeon or dentist. This document is available at our office, and  does not require an appointment to obtain it. Simply go to our office during business hours (Monday-Thursday from 8:00 AM to 4:00 PM) (Friday 8:00 AM to 12:00 Noon) or if you have a scheduled appointment with [place], prior to your surgery, and ask for it by name. In addition, you will need to provide [place] with your name, name of your surgeon, type of surgery, and date of procedure or surgery.  _____________________________________________________________________________________________  ____________________________________________________________________________________________  Appointment Policy  It is our goal and responsibility to provide the medical community with assistance in the evaluation and management of patients with chronic pain. Unfortunately our resources are limited. Because we do not have an unlimited amount of time, or available appointments, we are required to closely monitor and manage their use. The following rules exist to maximize their use:  Patient's responsibilities: [place]: You are required to be physically present and registered in our facility at least 30 minutes before your appointment. 11. Tardiness: The cutoff is your appointment time. If you have an appointment scheduled for 10:00 AM and you arrive at 10:01, you will be required to reschedule your appointment.  12. Plan ahead: Always assume that you will encounter traffic on your way in. Plan for it. If you are dependent on a driver, make sure they understand these rules and the need to arrive early. 13. Other appointments and responsibilities: Avoid scheduling any other appointments before or after your pain clinic appointments.  14. Be prepared: Write down everything that you need to discuss with your healthcare provider and give this information to the admitting nurse. Write down the medications that you will need refilled. Bring your pills and bottles (even the empty ones), to all of your appointments, except  for those where a procedure is scheduled. 15. No children or pets: Find someone to take care of them. It is not appropriate to bring them in. 16. Scheduling changes: We request "advanced notification" of any changes or cancellations. 17. Advanced notification: Defined as a time period of more than 24 hours prior to the originally scheduled appointment. This allows for the appointment to be offered to other patients. 18. Rescheduling: When a visit is rescheduled, it will require the cancellation of the original appointment. For this reason they both fall within the category of "Cancellations".  19. Cancellations: They require advanced notification. Any cancellation less than 24 hours before the  appointment will be recorded as a "No Show". 20. No Show: Defined as an unkept appointment where the patient failed to notify or declare to the practice their intention or inability to keep the appointment.  Corrective process for repeat offenders:  3. Tardiness: Three (3) episodes of rescheduling due to late arrivals will be recorded as one (1) "No Show". 4. Cancellation or reschedule: Three (3) cancellations or rescheduling will be recorded as one (1) "No Show". 5. "No Shows": Three (3) "No Shows" within a 12 month period will result in discharge from the practice.  _____________________________________________________________________________________________   Radiofrequency Lesioning Radiofrequency lesioning is a procedure that is performed to relieve pain. The procedure is often used for back, neck, or arm pain. Radiofrequency lesioning involves the use of a machine that creates radio waves to make heat. During the procedure, the heat is applied to the nerve that carries the pain signal. The heat damages the nerve and interferes with the pain signal. Pain relief usually starts about 2 weeks after the procedure and lasts for  6 months to 1 year. Tell a health care provider about:  Any allergies you  have.  All medicines you are taking, including vitamins, herbs, eye drops, creams, and over-the-counter medicines.  Any problems you or family members have had with anesthetic medicines.  Any blood disorders you have.  Any surgeries you have had.  Any medical conditions you have.  Whether you are pregnant or may be pregnant. What are the risks? Generally, this is a safe procedure. However, problems may occur, including:  Pain or soreness at the injection site.  Infection at the injection site.  Damage to nerves or blood vessels. What happens before the procedure?  Ask your health care provider about:  Changing or stopping your regular medicines. This is especially important if you are taking diabetes medicines or blood thinners.  Taking medicines such as aspirin and ibuprofen. These medicines can thin your blood. Do not take these medicines before your procedure if your health care provider instructs you not to.  Follow instructions from your health care provider about eating or drinking restrictions.  Plan to have someone take you home after the procedure.  If you go home right after the procedure, plan to have someone with you for 24 hours. What happens during the procedure?  You will be given one or more of the following:  A medicine to help you relax (sedative).  A medicine to numb the area (local anesthetic).  You will be awake during the procedure. You will need to be able to talk with the health care provider during the procedure.  With the help of a type of X-ray (fluoroscopy), the health care provider will insert a radiofrequency needle into the area to be treated.  Next, a wire that carries the radio waves (electrode) will be put through the radiofrequency needle. An electrical pulse will be sent through the electrode to verify the correct nerve. You will feel a tingling sensation, and you may have muscle twitching.  Then, the tissue that is around the needle  tip will be heated by an electric current that is passed using the radiofrequency machine. This will numb the nerves.  A bandage (dressing) will be put on the insertion area after the procedure is done. The procedure may vary among health care providers and hospitals. What happens after the procedure?  Your blood pressure, heart rate, breathing rate, and blood oxygen level will be monitored often until the medicines you were given have worn off.  Return to your normal activities as directed by your health care provider. This information is not intended to replace advice given to you by your health care provider. Make sure you discuss any questions you have with your health care provider. Document Released: 09/17/2010 Document Revised: 06/27/2015 Document Reviewed: 02/26/2014 Elsevier Interactive Patient Education  2017 [place]. Pain Management Discharge Instructions  General Discharge Instructions :  If you need to reach your doctor call: Monday-Friday 8:00 am - 4:00 pm at [phone] or toll free [phone].  After clinic hours [phone] to have operator reach doctor.  Bring all of your medication bottles to all your appointments in the pain clinic.  To cancel or reschedule your appointment with Pain Management please remember to call 24 hours in advance to avoid a fee.  Refer to the educational materials which you have been given on: General Risks, I had my Procedure. Discharge Instructions, Post Sedation.  Post Procedure Instructions:  The drugs you were given will stay in your system until tomorrow, so for  the next 24 hours you should not drive, make any legal decisions or drink any alcoholic beverages.  You may eat anything you prefer, but it is better to start with liquids then soups and crackers, and gradually work up to solid foods.  Please notify your doctor immediately if you have any unusual bleeding, trouble breathing or pain that is not related to your normal  pain.  Depending on the type of procedure that was done, some parts of your body may feel week and/or numb.  This usually clears up by tonight or the next day.  Walk with the use of an assistive device or accompanied by an adult for the 24 hours.  You may use ice on the affected area for the first 24 hours.  Put ice in a Ziploc bag and cover with a towel and place against area 15 minutes on 15 minutes off.  You may switch to heat after 24 hours.

## 2016-07-21 ENCOUNTER — Ambulatory Visit: Payer: BLUE CROSS/BLUE SHIELD | Attending: Nurse Practitioner | Admitting: Nurse Practitioner

## 2016-07-21 ENCOUNTER — Encounter: Payer: Self-pay | Admitting: Nurse Practitioner

## 2016-07-21 VITALS — BP 136/89 | HR 108 | Temp 98.1°F | Resp 18 | Ht 64.0 in | Wt 197.0 lb

## 2016-07-21 DIAGNOSIS — M791 Myalgia: Secondary | ICD-10-CM | POA: Diagnosis not present

## 2016-07-21 DIAGNOSIS — Z9049 Acquired absence of other specified parts of digestive tract: Secondary | ICD-10-CM | POA: Diagnosis not present

## 2016-07-21 DIAGNOSIS — E781 Pure hyperglyceridemia: Secondary | ICD-10-CM | POA: Diagnosis not present

## 2016-07-21 DIAGNOSIS — E7800 Pure hypercholesterolemia, unspecified: Secondary | ICD-10-CM | POA: Insufficient documentation

## 2016-07-21 DIAGNOSIS — M25569 Pain in unspecified knee: Secondary | ICD-10-CM | POA: Insufficient documentation

## 2016-07-21 DIAGNOSIS — Z9889 Other specified postprocedural states: Secondary | ICD-10-CM | POA: Diagnosis not present

## 2016-07-21 DIAGNOSIS — F419 Anxiety disorder, unspecified: Secondary | ICD-10-CM | POA: Diagnosis not present

## 2016-07-21 DIAGNOSIS — K760 Fatty (change of) liver, not elsewhere classified: Secondary | ICD-10-CM | POA: Diagnosis not present

## 2016-07-21 DIAGNOSIS — Z79899 Other long term (current) drug therapy: Secondary | ICD-10-CM | POA: Diagnosis not present

## 2016-07-21 DIAGNOSIS — G894 Chronic pain syndrome: Secondary | ICD-10-CM | POA: Diagnosis not present

## 2016-07-21 DIAGNOSIS — K219 Gastro-esophageal reflux disease without esophagitis: Secondary | ICD-10-CM | POA: Diagnosis not present

## 2016-07-21 DIAGNOSIS — Z880 Allergy status to penicillin: Secondary | ICD-10-CM | POA: Diagnosis not present

## 2016-07-21 DIAGNOSIS — E119 Type 2 diabetes mellitus without complications: Secondary | ICD-10-CM | POA: Insufficient documentation

## 2016-07-21 DIAGNOSIS — R51 Headache: Secondary | ICD-10-CM | POA: Insufficient documentation

## 2016-07-21 DIAGNOSIS — E559 Vitamin D deficiency, unspecified: Secondary | ICD-10-CM | POA: Diagnosis not present

## 2016-07-21 DIAGNOSIS — Z885 Allergy status to narcotic agent status: Secondary | ICD-10-CM | POA: Insufficient documentation

## 2016-07-21 DIAGNOSIS — F329 Major depressive disorder, single episode, unspecified: Secondary | ICD-10-CM | POA: Diagnosis not present

## 2016-07-21 DIAGNOSIS — E669 Obesity, unspecified: Secondary | ICD-10-CM | POA: Insufficient documentation

## 2016-07-21 DIAGNOSIS — M4696 Unspecified inflammatory spondylopathy, lumbar region: Secondary | ICD-10-CM

## 2016-07-21 DIAGNOSIS — I10 Essential (primary) hypertension: Secondary | ICD-10-CM | POA: Insufficient documentation

## 2016-07-21 DIAGNOSIS — G8918 Other acute postprocedural pain: Secondary | ICD-10-CM | POA: Insufficient documentation

## 2016-07-21 DIAGNOSIS — Z7982 Long term (current) use of aspirin: Secondary | ICD-10-CM | POA: Insufficient documentation

## 2016-07-21 DIAGNOSIS — N83201 Unspecified ovarian cyst, right side: Secondary | ICD-10-CM | POA: Insufficient documentation

## 2016-07-21 DIAGNOSIS — M47816 Spondylosis without myelopathy or radiculopathy, lumbar region: Secondary | ICD-10-CM | POA: Diagnosis not present

## 2016-07-21 DIAGNOSIS — K589 Irritable bowel syndrome without diarrhea: Secondary | ICD-10-CM | POA: Insufficient documentation

## 2016-07-21 DIAGNOSIS — M7918 Myalgia, other site: Secondary | ICD-10-CM

## 2016-07-21 MED ORDER — HYDROCODONE-ACETAMINOPHEN 5-325 MG PO TABS: 1.0000 | ORAL_TABLET | Freq: Four times a day (QID) | ORAL | 0 refills | Status: DC | PRN

## 2016-07-21 MED ORDER — GABAPENTIN 300 MG PO CAPS: 300.0000 mg | ORAL_CAPSULE | Freq: Three times a day (TID) | ORAL | 0 refills | Status: DC

## 2016-07-21 NOTE — Progress Notes (Signed)
Nursing Pain Medication Assessment:  Safety precautions to be maintained throughout the outpatient stay will include: orient to surroundings, keep bed in low position, maintain call bell within reach at all times, provide assistance with transfer out of bed and ambulation.  Medication Inspection Compliance: Pill count conducted under aseptic conditions, in front of the patient. Neither the pills nor the bottle was removed from the patient's sight at any time. Once count was completed pills were immediately returned to the patient in their original bottle.  Medication: Oxycodone IR Pill/Patch Count: 0 of 180 pills remain Pill/Patch Appearance: Markings consistent with prescribed medication Bottle Appearance: Standard pharmacy container. Clearly labeled. Filled Date:5/22/ 2018 Last Medication intake:  Today

## 2016-07-21 NOTE — Patient Instructions (Signed)

## 2016-07-21 NOTE — Progress Notes (Addendum)
Patient's Name: Kelly Rollins  MRN: [id number]  Referring Provider: Leonel Ramsay, MD  DOB: Oct 27, 1970  PCP: Leonel Ramsay, MD  DOS: 07/21/2016  Note by: Vevelyn Francois NP  Service setting: Ambulatory outpatient  Specialty: Interventional Pain Management  Location: [facility] (AMB) Pain Management Facility    Patient type: Established    Primary Reason(s) for Visit: Encounter for prescription drug management (Level of risk: moderate) CC: Back Pain (lower)  HPI  Kelly Rollins is a 46 y.o. year old, female patient, who comes today for a medication management evaluation. She has TOBACCO ABUSE; GERD; Constipation; Pure hypercholesterolemia; Hyperlipidemia; Fatty liver disease; Anxiety; Obesity; Essential hypertriglyceridemia; Long term current use of opiate analgesic; Long term prescription opiate use; Opiate use (25 MME/Day); Encounter for therapeutic drug level monitoring; Encounter for pain management planning; Chronic low back pain (Location of Primary Source of Pain) (Bilateral) (L>R); Lumbar facet syndrome (Location of Primary Source of Pain) (Bilateral) (L>R); Chronic hip pain (Location of Secondary source of pain) (Bilateral) (L>R); Chronic knee pain (Location of Tertiary source of pain) (Bilateral) (L>R); Lumbar spondylosis; Neuropathic pain; Neurogenic pain; Myofascial pain; Acute low back pain; Lumbar transverse process fracture (HCC) (Left L1) (03/11/15); Chronic pain syndrome; Depression; Diabetes mellitus type 2, uncomplicated ([place]); Fatty liver; HTN (hypertension), benign; Vitamin D insufficiency; and Acute postoperative pain on her problem list. Her primarily concern today is the Back Pain (lower)  Pain Assessment: Self-Reported Pain Score: 3 /10             Reported level is compatible with observation.       Pain Type: Chronic pain Pain Location: Back Pain Orientation: Lower Pain Descriptors / Indicators: Dull Pain Frequency: Constant  Kelly Rollins was last scheduled for an  appointment on Visit date not found for medication management. During today's appointment we reviewed Kelly Rollins's chronic pain status, as well as her outpatient medication regimen. She has chronic low back pain. She has radicular symptoms just into hips. She admits that she has constant pain. She has admits that she is doing well after radiofrequency. She does feel like she is having increased bruising. She admits that she is using over the area. She is wondering if she can use a TENS unit also. She states the Norco was more effective for her in the past. She is wanting to wean off of these medications. She admits that she did have headache with Norco in the past but is wanting to retry. She is ready to go back to work.  The patient  reports that she does not use drugs. Her body mass index is 33.81 kg/m.  Further details on both, my assessment(s), as well as the proposed treatment plan, please see below.  Controlled Substance Pharmacotherapy Assessment REMS (Risk Evaluation and Mitigation Strategy)  Analgesic:Oxycodone IRone tablet 5 times a day (25 mg/day) MME/day:37.77m/day   WLandis Martins RN  07/21/2016  8:16 AM  Sign at close encounter Nursing Pain Medication Assessment:  Safety precautions to be maintained throughout the outpatient stay will include: orient to surroundings, keep bed in low position, maintain call bell within reach at all times, provide assistance with transfer out of bed and ambulation.  Medication Inspection Compliance: Pill count conducted under aseptic conditions, in front of the patient. Neither the pills nor the bottle was removed from the patient's sight at any time. Once count was completed pills were immediately returned to the patient in their original bottle.  Medication: Oxycodone IR Pill/Patch Count: 0 of 180 pills  remain Pill/Patch Appearance: Markings consistent with prescribed medication Bottle Appearance: Standard pharmacy container. Clearly  labeled. Filled Date:5/22/ 2018 Last Medication intake:  Today   Pharmacokinetics: Liberation and absorption (onset of action): WNL Distribution (time to peak effect): WNL Metabolism and excretion (duration of action): WNL         Pharmacodynamics: Desired effects: Analgesia: Kelly Rollins reports >50% benefit. Functional ability: Patient reports that medication allows her to accomplish basic ADLs Clinically meaningful improvement in function (CMIF): Sustained CMIF goals met Perceived effectiveness: Described as relatively effective, allowing for increase in activities of daily living (ADL) Undesirable effects: Side-effects or Adverse reactions: None reported Monitoring: [place] PMP: Online review of the past 24-monthperiod conducted. Compliant with practice rules and regulations List of all UDS test(s) done:  Lab Results  Component Value Date   TOXASSSELUR FINAL 05/29/2015   [place] 05/01/2015   SUMMARY FINAL 04/28/2016   Last UDS on record: [facility] Select 13  Date Value Ref Range Status  05/29/2015 FINAL  Final    Comment:    ==================================================================== TOXASSURE SELECT 13 (MW) ==================================================================== Test                             Result       Flag       Units Drug Present and Declared for Prescription Verification   Hydrocodone                    3507         EXPECTED   ng/mg creat   Hydromorphone                  1175         EXPECTED   ng/mg creat   Dihydrocodeine                 438          EXPECTED   ng/mg creat   Norhydrocodone                 2664         EXPECTED   ng/mg creat    Sources of hydrocodone include scheduled prescription    medications. Hydromorphone, dihydrocodeine and norhydrocodone are    expected metabolites of hydrocodone. Hydromorphone and    dihydrocodeine are also available as scheduled prescription     medications. ==================================================================== Test                      Result    Flag   Units      Ref Range   Creatinine              55               mg/dL      >=20 ==================================================================== Declared Medications:  The flagging and interpretation on this report are based on the  following declared medications.  Unexpected results may arise from  inaccuracies in the declared medications.  **Note: The testing scope of this panel includes these medications:  Hydrocodone (Hydrocodone-Acetaminophen)  **Note: The testing scope of this panel does not include following  reported medications:  Acetaminophen (Hydrocodone-Acetaminophen)  Gabapentin (Neurontin)  Meloxicam (Mobic)  Methocarbamol (Robaxin)  Multivitamin (MVI)  Vitamin C ==================================================================== For clinical consultation, please call [phone] ====================================================================    Summary  Date Value Ref Range Status  04/28/2016 FINAL  Final    Comment:    ====================================================================  TOXASSURE COMP DRUG ANALYSIS,UR ==================================================================== Test                             Result       Flag       Units Drug Present not Declared for Prescription Verification   Trazodone                      PRESENT      UNEXPECTED   1,3 chlorophenyl piperazine    PRESENT      UNEXPECTED    1,3-chlorophenyl piperazine is an expected metabolite of    trazodone.   Acetaminophen                  PRESENT      UNEXPECTED   Clonidine                      PRESENT      UNEXPECTED Drug Absent but Declared for Prescription Verification   Oxycodone                      Not Detected UNEXPECTED ng/mg creat   Naproxen                       Not Detected  UNEXPECTED ==================================================================== Test                      Result    Flag   Units      Ref Range   Creatinine              165              mg/dL      >=20 ==================================================================== Declared Medications:  The flagging and interpretation on this report are based on the  following declared medications.  Unexpected results may arise from  inaccuracies in the declared medications.  **Note: The testing scope of this panel includes these medications:  Naproxen  Oxycodone  **Note: The testing scope of this panel does not include following  reported medications:  Albuterol  Cholecalciferol  Magnesium (Mag)  Multivitamin (MVI)  Vitamin C ==================================================================== For clinical consultation, please call [phone]. ====================================================================    UDS interpretation: Compliant          Medication Assessment Form: Reviewed. Patient indicates being compliant with therapy Treatment compliance: Compliant Risk Assessment Profile: Aberrant behavior: See prior evaluations. None observed or detected today Comorbid factors increasing risk of overdose: See prior notes. No additional risks detected today Risk of substance use disorder (SUD): Low Opioid Risk Tool (ORT) Total Score:    Interpretation Table:  Score <3 = Low Risk for SUD  Score between 4-7 = Moderate Risk for SUD  Score >8 = High Risk for Opioid Abuse   Risk Mitigation Strategies:  Patient Counseling: Covered Patient-Prescriber Agreement (PPA): Present and active  Notification to other healthcare providers: Done  Pharmacologic Plan: No change in therapy, at this time  Laboratory Chemistry  Inflammation Markers Lab Results  Component Value Date   CRP 2.5 (H) 04/28/2016   ESRSEDRATE 40 (H) 04/28/2016   (CRP: Acute Phase) (ESR: Chronic Phase) Renal  Function Markers Lab Results  Component Value Date   BUN 16 04/28/2016   CREATININE 0.65 04/28/2016   GFRAA >60 04/28/2016   GFRNONAA >60 04/28/2016   Hepatic Function Markers Lab Results  Component Value  Date   AST 47 (H) 04/28/2016   ALT 57 (H) 04/28/2016   ALBUMIN 4.0 04/28/2016   ALKPHOS 78 04/28/2016   Electrolytes Lab Results  Component Value Date   NA 135 04/28/2016   K 4.2 04/28/2016   CL 99 (L) 04/28/2016   CALCIUM 9.4 04/28/2016   MG 2.1 04/28/2016   Neuropathy Markers Lab Results  Component Value Date   VITAMINB12 638 04/28/2016   Bone Pathology Markers Lab Results  Component Value Date   ALKPHOS 78 04/28/2016   25OHVITD1 27 (L) 04/28/2016   25OHVITD2 3.7 04/28/2016   25OHVITD3 23 04/28/2016   CALCIUM 9.4 04/28/2016   Coagulation Parameters Lab Results  Component Value Date   PLT 260 03/18/2013   Cardiovascular Markers Lab Results  Component Value Date   HGB 14.2 03/18/2013   HCT 41.1 03/18/2013   Note: Lab results reviewed.  Recent Diagnostic Imaging Review  Dg C-arm 1-60 Min-no Report  Result Date: 06/23/2016 Fluoroscopy was utilized by the requesting physician.  No radiographic interpretation.   Note: Imaging results reviewed.          Meds  The patient has a current medication list which includes the following prescription(s): vitamin c, aspirin-acetaminophen-caffeine, vitamin d3, magnesium, multivitamin, albuterol, gabapentin, hydrocodone-acetaminophen, hydrocodone-acetaminophen, and hydrocodone-acetaminophen.  Current Outpatient Prescriptions on File Prior to Visit  Medication Sig  . Ascorbic Acid (VITAMIN C) 100 MG tablet Take 100 mg by mouth daily.  . Aspirin-Acetaminophen-Caffeine (EXCEDRIN EXTRA STRENGTH PO) Take 2 tablets by mouth as needed.  . Cholecalciferol (VITAMIN D3) 1000 units CAPS Take by mouth daily.  . Magnesium 250 MG TABS Take by mouth at bedtime.  . Multiple Vitamin (MULTIVITAMIN) tablet Take 1 tablet by mouth  daily.  Marland Kitchen albuterol (PROAIR HFA) 108 (90 Base) MCG/ACT inhaler Inhale 2 puffs into the lungs every 6 (six) hours as needed.    No current facility-administered medications on file prior to visit.    ROS  Constitutional: Denies any fever or chills Gastrointestinal: No reported hemesis, hematochezia, vomiting, or acute GI distress Musculoskeletal: Denies any acute onset joint swelling, redness, loss of ROM, or weakness Neurological: No reported episodes of acute onset apraxia, aphasia, dysarthria, agnosia, amnesia, paralysis, loss of coordination, or loss of consciousness  Allergies  Kelly Rollins is allergic to morphine; penicillins; and tramadol hcl.  PFSH  Drug: Kelly Rollins  reports that she does not use drugs. Alcohol:  reports that she drinks alcohol. Tobacco:  reports that she has quit smoking. She has never used smokeless tobacco. Medical:  has a past medical history of ABDOMINAL PAIN OTHER SPECIFIED SITE (02/21/2009); ABDOMINAL PAIN-LUQ (04/17/2009); Anal fissure; Headache(784.0); HEMATOCHEZIA (04/17/2009); Hypertension; IBS (irritable bowel syndrome); Migraine headache (11/06/2008); OVARIAN CYST, RIGHT (02/21/2009); and PALPITATIONS, OCCASIONAL (04/17/2010). Family: family history includes Cancer in her mother; Heart disease in her father; Hyperlipidemia in her father and mother; Hypertension in her father and mother.  Past Surgical History:  Procedure Laterality Date  . BREAST REDUCTION SURGERY     bilateral  . CESAREAN SECTION    . CHOLECYSTECTOMY    . VAGINAL HYSTERECTOMY     Constitutional Exam  General appearance: Well nourished, well developed, and well hydrated. In no apparent acute distress Vitals:   07/21/16 0810  BP: 136/89  Pulse: (!) 108  Resp: 18  Temp: 98.1 F (36.7 C)  TempSrc: Oral  SpO2: 98%  Weight: 197 lb (89.4 kg)  Height: 5\' 4"  (1.626 m)   BMI Assessment: Estimated body mass index is 33.81 kg/m as  calculated from the following:   Height as of this  encounter: _0  (1.626 m).   Weight as of this encounter: 197 lb (89.4 kg).  BMI interpretation table: BMI level Category Range association with higher incidence of chronic pain  <18 kg/m2 Underweight   18.5-24.9 kg/m2 Ideal body weight   25-29.9 kg/m2 Overweight Increased incidence by 20%  30-34.9 kg/m2 Obese (Class I) Increased incidence by 68%  35-39.9 kg/m2 Severe obesity (Class II) Increased incidence by 136%  >40 kg/m2 Extreme obesity (Class III) Increased incidence by 254%   BMI Readings from Last 4 Encounters:  07/21/16 33.81 kg/m  06/23/16 32.61 kg/m  06/08/16 32.61 kg/m  04/28/16 31.24 kg/m   Wt Readings from Last 4 Encounters:  07/21/16 197 lb (89.4 kg)  06/23/16 190 lb (86.2 kg)  06/08/16 190 lb (86.2 kg)  04/28/16 182 lb (82.6 kg)  Psych/Mental status: Alert, oriented x 3 (person, place, & time)       Eyes: PERLA Respiratory: No evidence of acute respiratory distress  Cervical Spine Exam  Inspection: No masses, redness, or swelling Alignment: Symmetrical Functional ROM: Unrestricted ROM      Stability: No instability detected Muscle strength & Tone: Functionally intact Sensory: Unimpaired Palpation: No palpable anomalies              Upper Extremity (UE) Exam    Side: Right upper extremity  Side: Left upper extremity  Inspection: No masses, redness, swelling, or asymmetry. No contractures  Inspection: No masses, redness, swelling, or asymmetry. No contractures  Functional ROM: Unrestricted ROM          Functional ROM: Unrestricted ROM          Muscle strength & Tone: Functionally intact  Muscle strength & Tone: Functionally intact  Sensory: Unimpaired  Sensory: Unimpaired  Palpation: No palpable anomalies              Palpation: No palpable anomalies              Specialized Test(s): Deferred         Specialized Test(s): Deferred          Thoracic Spine Exam  Inspection: No masses, redness, or swelling Alignment: Symmetrical Functional ROM:  Unrestricted ROM Stability: No instability detected Sensory: Unimpaired Muscle strength & Tone: No palpable anomalies  Lumbar Spine Exam  Inspection: No masses, redness, or swelling Alignment: Symmetrical Functional ROM: Unrestricted ROM      Stability: No instability detected Muscle strength & Tone: Increased muscle tone over affected area Sensory: Unimpaired Palpation: Complains of area being tender to palpation       Provocative Tests: Lumbar Hyperextension and rotation test: evaluation deferred today       Patrick's Maneuver: evaluation deferred today                    Gait & Posture Assessment  Ambulation: Unassisted Gait: Relatively normal for age and body habitus Posture: WNL   Lower Extremity Exam    Side: Right lower extremity  Side: Left lower extremity  Inspection: No masses, redness, swelling, or asymmetry. No contractures  Inspection: No masses, redness, swelling, or asymmetry. No contractures  Functional ROM: Unrestricted ROM          Functional ROM: Unrestricted ROM          Muscle strength & Tone: Functionally intact  Muscle strength & Tone: Functionally intact  Sensory: Unimpaired  Sensory: Unimpaired  Palpation: No palpable anomalies  Palpation: No palpable anomalies  Assessment  Primary Diagnosis & Pertinent Problem List: The primary encounter diagnosis was Lumbar facet syndrome (Location of Primary Source of Pain) (Bilateral) (L>R). Diagnoses of Lumbar spondylosis, Myofascial pain, and Chronic pain syndrome were also pertinent to this visit.  Status Diagnosis  Controlled Controlled Controlled 1. Lumbar facet syndrome (Location of Primary Source of Pain) (Bilateral) (L>R)   2. Lumbar spondylosis   3. Myofascial pain   4. Chronic pain syndrome     Problems updated and reviewed during this visit: Problem  Chronic Pain Syndrome  Lumbar transverse process fracture (HCC) (Left L1) (03/11/15)  Chronic low back pain (Location of Primary Source of Pain)  (Bilateral) (L>R)  Lumbar facet syndrome (Location of Primary Source of Pain) (Bilateral) (L>R)  Chronic hip pain (Location of Secondary source of pain) (Bilateral) (L>R)  Chronic knee pain (Location of Tertiary source of pain) (Bilateral) (L>R)  Lumbar Spondylosis  Neuropathic Pain  Neurogenic Pain  Myofascial Pain  Long Term Current Use of Opiate Analgesic  Long Term Prescription Opiate Use  Opiate use (25 MME/Day)   Hydrocodone/APAP 5/325 one tablet 5 times a day (25 mg/day)   Acute Postoperative Pain  Vitamin D Insufficiency  Depression  Diabetes Mellitus Type 2, Uncomplicated (Hcc)  Fatty Liver   Overview:  with elevation in alkaline phosphate 39   Htn (Hypertension), Benign  Acute Low Back Pain  Encounter for Therapeutic Drug Level Monitoring  Encounter for Pain Management Planning  Essential Hypertriglyceridemia  Anxiety  Obesity  Hyperlipidemia   By: Joesph Fillers CMA (AAMA), Natasha     Fatty liver disease   By: Deborra Medina MD, Talia     Pure Hypercholesterolemia   By: Joesph Fillers CMA (AAMA), Natasha     GERD   By: Fuller Plan MD Lamont Snowball T    Constipation   By: Fuller Plan MD Lamont Snowball T    TOBACCO ABUSE   Qualifier: Diagnosis of  By: Deborra Medina MD, Talia      Plan of Care  Pharmacotherapy (Medications Ordered): Meds ordered this encounter  Medications  . gabapentin (NEURONTIN) 300 MG capsule    Sig: Take 1 capsule (300 mg total) by mouth every 8 (eight) hours.    Dispense:  90 capsule    Refill:  0    Do not place this medication, or any other prescription from our practice, on "Automatic Refill". Patient may have prescription filled one day early if pharmacy is closed on scheduled refill date.    Order Specific Question:   Supervising Provider    Answer:   Milinda Pointer [phone]  . HYDROcodone-acetaminophen (NORCO/VICODIN) 5-325 MG tablet    Sig: Take 1 tablet by mouth every 6 (six) hours as needed for moderate pain or severe pain.    Dispense:  120 tablet     Refill:  0    Do not add this medication to the electronic "Automatic Refill" notification system. Patient may have prescription filled one day early if pharmacy is closed on scheduled refill date. Do not fill until: 07/23/16 To last until: 08/22/16    Order Specific Question:   Supervising Provider    Answer:   Milinda Pointer [phone]  . HYDROcodone-acetaminophen (NORCO/VICODIN) 5-325 MG tablet    Sig: Take 1 tablet by mouth every 6 (six) hours as needed for moderate pain. Do not add this medication to the electronic "Automatic Refill" notification system. Patient may have prescription filled one day early if pharmacy is closed on scheduled refill date. Do not fill until: 08/22/16 To last until: 09/21/16  Dispense:  120 tablet    Refill:  0    Order Specific Question:   Supervising Provider    Answer:   Milinda Pointer [phone]  . HYDROcodone-acetaminophen (NORCO/VICODIN) 5-325 MG tablet    Sig: Take 1 tablet by mouth every 6 (six) hours as needed for moderate pain. Do not add this medication to the electronic "Automatic Refill" notification system. Patient may have prescription filled one day early if pharmacy is closed on scheduled refill date. Do not fill until: 09/21/16 To last until: 10/21/16    Dispense:  120 tablet    Refill:  0    Order Specific Question:   Supervising Provider    Answer:   Milinda Pointer [phone]   New Prescriptions   GABAPENTIN (NEURONTIN) 300 MG CAPSULE    Take 1 capsule (300 mg total) by mouth every 8 (eight) hours.   Medications administered today: Kelly Rollins had no medications administered during this visit. Lab-work, procedure(s), and/or referral(s): No orders of the defined types were placed in this encounter.  Imaging and/or referral(s): None  Interventional therapies: Planned, scheduled, and/or pending:   Regimen changed from Oxycodone to [place] Will restart Gabapentin at night Follow up with Dr Lowella Dandy in 2 weeks for RFA  evaluation   Considering:   Diagnostic bilateral hip joint injection Palliative bilateral lumbar facet block  Bilateral lumbar facet radiofrequency ablation.    Palliative PRN treatment(s):   Diagnostic bilateral hip joint injection  Palliative Bilateral lumbar facet block plus left L1 transverse process injection   Provider-requested follow-up: Return in about 3 months (around 10/21/2016) for MedMgmt.  Future Appointments Date Time Provider [place]  08/04/2016 9:00 AM Milinda Pointer, MD [facility] None  10/21/2016 8:30 AM Vevelyn Francois, NP [facility] None   Primary Care Physician: Leonel Ramsay, MD Location: [facility] Note by: Vevelyn Francois NP Date: 07/21/2016; Time: 9:00 AM  Pain Score Disclaimer: We use the NRS-11 scale. This is a self-reported, subjective measurement of pain severity with only modest accuracy. It is used primarily to identify changes within a particular patient. It must be understood that outpatient pain scales are significantly less accurate that those used for research, where they can be applied under ideal controlled circumstances with minimal exposure to variables. In reality, the score is likely to be a combination of pain intensity and pain affect, where pain affect describes the degree of emotional arousal or changes in action readiness caused by the sensory experience of pain. Factors such as social and work situation, setting, emotional state, anxiety levels, expectation, and prior pain experience may influence pain perception and show large inter-individual differences that may also be affected by time variables.  Patient instructions provided during this appointment: Patient Instructions   ____________________________________________________________________________________________  Medication Rules  Applies to: All patients receiving prescriptions (written or electronic).  Pharmacy of record: Pharmacy  where electronic prescriptions will be sent. If written prescriptions are taken to a different pharmacy, please inform the nursing staff. The pharmacy listed in the electronic medical record should be the one where you would like electronic prescriptions to be sent.  Prescription refills: Only during scheduled appointments. Applies to both, written and electronic prescriptions.  NOTE: The following applies primarily to controlled substances (Opioid Pain Medications)  Patient's responsibilities: 1. Pain Pills: Bring all pain pills to every appointment (except for procedure appointments). 2. Pill Bottles: Bring pills in original pharmacy bottle. Always bring newest bottle. Bring bottle, even if empty. 3. Medication refills: You are  responsible for knowing and keeping track of what medications you need refilled. The day before your appointment, write a list of all prescriptions that need to be refilled. Bring that list to your appointment and give it to the admitting nurse. Prescriptions will be written only during appointments. If you forget a medication, it will not be "Called in", "Faxed", or "electronically sent". You will need to get another appointment to get these prescribed. 4. Prescription Accuracy: You are responsible for carefully inspecting your prescriptions before leaving our office. Have the discharge nurse carefully go over each prescription with you, before taking them home. Make sure that your name is accurately spelled, that your address is correct. Check the name and dose of your medication to make sure it is accurate. Check the number of pills, and the written instructions to make sure they are clear and accurate. Make sure that you are given enough medication to last until your next medication refill appointment. 5. Taking Medication: Take medication as prescribed. Never take more pills than instructed. Never take medication more frequently than prescribed. Taking less pills or less  frequently is permitted and encouraged, when it comes to controlled substances (written prescriptions).  6. Inform other Doctors: Always inform, all of your healthcare providers, of all the medications you take. 7. Pain Medication from other Providers: You are not allowed to accept any additional pain medication from any other Doctor or Healthcare provider. There are two exceptions to this rule. (see below) In the event that you require additional pain medication, you are responsible for notifying us, as stated below. 8. Medication Agreement: You are responsible for carefully reading and following our Medication Agreement. This must be signed before receiving any prescriptions from our practice. Safely store a copy of your signed Agreement. Violations to the Agreement will result in no further prescriptions. (Additional copies of our Medication Agreement are available upon request.) 9. Laws, Rules, & Regulations: All patients are expected to follow all Federal and [employer], Rules, [employer]. Ignorance of the Laws does not constitute a valid excuse.  Exceptions: There are only two exceptions to the rule of not receiving pain medications from other Healthcare Providers. 1. Exception #1 (Emergencies): In the event of an emergency (i.e.: accident requiring emergency care), you are allowed to receive additional pain medication. However, you are responsible for: As soon as you are able, call our office [phone], at any time of the day or night, and leave a message stating your name, the date and nature of the emergency, and the name and dose of the medication prescribed. In the event that your call is answered by a member of our staff, make sure to document and save the date, time, and the name of the person that took your information.  2. Exception #2 (Planned Surgery): In the event that you are scheduled by another doctor or dentist to have any type of surgery or procedure, you are allowed  (for a period no longer than 30 days), to receive additional pain medication, for the acute post-op pain. However, in this case, you are responsible for picking up a copy of our "Post-op Pain Management for Surgeons" handout, and giving it to your surgeon or dentist. This document is available at our office, and does not require an appointment to obtain it. Simply go to our office during business hours (Monday-Thursday from 8:00 AM to 4:00 PM) (Friday 8:00 AM to 12:00 Noon) or if you have a scheduled appointment with [place], prior to  your surgery, and ask for it by name. In addition, you will need to provide [place] with your name, name of your surgeon, type of surgery, and date of procedure or surgery.  ____________________________________________________________________________________________

## 2016-08-03 NOTE — Progress Notes (Deleted)
Patient's Name: Kelly Rollins  MRN: [id number]  Referring Provider: Leonel Ramsay, MD  DOB: [birth year]  PCP: Leonel Ramsay, MD  DOS: 08/04/2016  Note by: Kathlen Brunswick. Dossie Arbour, MD  Service setting: Ambulatory outpatient  Specialty: Interventional Pain Management  Location: [facility] (AMB) Pain Management Facility    Patient type: Established   Primary Reason(s) for Visit: Encounter for post-procedure evaluation of chronic illness with mild to moderate exacerbation CC: No chief complaint on file.  HPI  Kelly Rollins is a 46 y.o. year old, female patient, who comes today for a post-procedure evaluation. She has TOBACCO ABUSE; GERD; Constipation; Pure hypercholesterolemia; Hyperlipidemia; Fatty liver disease; Anxiety; Obesity; Essential hypertriglyceridemia; Long term current use of opiate analgesic; Long term prescription opiate use; Opiate use (25 MME/Day); Encounter for therapeutic drug level monitoring; Encounter for pain management planning; Chronic low back pain (Location of Primary Source of Pain) (Bilateral) (L>R); Lumbar facet syndrome (Location of Primary Source of Pain) (Bilateral) (L>R); Chronic hip pain (Location of Secondary source of pain) (Bilateral) (L>R); Chronic knee pain (Location of Tertiary source of pain) (Bilateral) (L>R); Lumbar spondylosis; Neuropathic pain; Neurogenic pain; Myofascial pain; Acute low back pain; Lumbar transverse process fracture (HCC) (Left L1) (03/11/15); Chronic pain syndrome; Depression; Diabetes mellitus type 2, uncomplicated ([place]); Fatty liver; HTN (hypertension), benign; Vitamin D insufficiency; and Acute postoperative pain on her problem list. Her primarily concern today is the No chief complaint on file.  Pain Assessment: Location:     Radiating:   Onset:   Duration:   Quality:   Severity:  /10 (self-reported pain score)  Note: Reported level is compatible with observation.                   Effect on ADL:   Timing:   Modifying factors:     [name] comes in today for post-procedure evaluation after the treatment done on 06/23/2016.  Further details on both, my assessment(s), as well as the proposed treatment plan, please see below.  Post-Procedure Assessment  06/23/2016 Procedure: Bilateral lumbar facet radiofrequency ablation under fluoroscopic guidance and IV sedation with the right side done on 06/23/2016 in the left side done on 06/08/2016 Pre-procedure pain score:  3/10 Post-procedure pain score: 2/10         Influential Factors: BMI:   Intra-procedural challenges: None observed Assessment challenges: None detected         Post-procedural adverse reactions or complications: None reported Reported side-effects: None  Sedation: Sedation provided. When no sedatives are used, the analgesic levels obtained are directly associated to the effectiveness of the local anesthetics. However, when sedation is provided, the level of analgesia obtained during the initial 1 hour following the intervention, is believed to be the result of a combination of factors. These factors may include, but are not limited to: 1. The effectiveness of the local anesthetics used. 2. The effects of the analgesic(s) and/or anxiolytic(s) used. 3. The degree of discomfort experienced by the patient at the time of the procedure. 4. The patients ability and reliability in recalling and recording the events. 5. The presence and influence of possible secondary gains and/or psychosocial factors. Reported result: Relief experienced during the 1st hour after the procedure:   (Ultra-Short Term Relief) Interpretative annotation: Analgesia during this period is likely to be Local Anesthetic and/or IV Sedative (Analgesic/Anxiolitic) related.          Effects of local anesthetic: The analgesic effects attained during this period are directly associated to the localized infiltration of  local anesthetics and therefore cary significant diagnostic value as to the  etiological location, or anatomical origin, of the pain. Expected duration of relief is directly dependent on the pharmacodynamics of the local anesthetic used. Long-acting (4-6 hours) anesthetics used.  Reported result: Relief during the next 4 to 6 hour after the procedure:   (Short-Term Relief) Interpretative annotation: Complete relief would suggest area to be the source of the pain.          Long-term benefit: Defined as the period of time past the expected duration of local anesthetics. With the possible exception of prolonged sympathetic blockade from the local anesthetics, benefits during this period are typically attributed to, or associated with, other factors such as analgesic sensory neuropraxia, antiinflammatory effects, or beneficial biochemical changes provided by agents other than the local anesthetics Reported result: Extended relief following procedure:   (Long-Term Relief) Interpretative annotation: Good relief. This could suggest inflammation to be a significant component in the etiology to the pain.          Current benefits: Defined as persistent relief that continues at this point in time.   Reported results: Treated area: *** %       Interpretative annotation: Ongoing benefits would suggest effective therapeutic approach  Interpretation: Results would suggest a successful diagnostic intervention.          Laboratory Chemistry  Inflammation Markers (CRP: Acute Phase) (ESR: Chronic Phase) Lab Results  Component Value Date   CRP 2.5 (H) 04/28/2016   ESRSEDRATE 40 (H) 04/28/2016                 Renal Function Markers Lab Results  Component Value Date   BUN 16 04/28/2016   CREATININE 0.65 04/28/2016   GFRAA >60 04/28/2016   GFRNONAA >60 04/28/2016                 Hepatic Function Markers Lab Results  Component Value Date   AST 47 (H) 04/28/2016   ALT 57 (H) 04/28/2016   ALBUMIN 4.0 04/28/2016   ALKPHOS 78 04/28/2016                 Electrolytes Lab Results   Component Value Date   NA 135 04/28/2016   K 4.2 04/28/2016   CL 99 (L) 04/28/2016   CALCIUM 9.4 04/28/2016   MG 2.1 04/28/2016                 Neuropathy Markers Lab Results  Component Value Date   VITAMINB12 638 04/28/2016                 Bone Pathology Markers Lab Results  Component Value Date   ALKPHOS 78 04/28/2016   25OHVITD1 27 (L) 04/28/2016   25OHVITD2 3.7 04/28/2016   25OHVITD3 23 04/28/2016   CALCIUM 9.4 04/28/2016                 Coagulation Parameters Lab Results  Component Value Date   PLT 260 03/18/2013                 Cardiovascular Markers Lab Results  Component Value Date   HGB 14.2 03/18/2013   HCT 41.1 03/18/2013                 Note: Lab results reviewed.  Recent Diagnostic Imaging Review  Dg C-arm 1-60 Min-no Report  Result Date: 06/23/2016 Fluoroscopy was utilized by the requesting physician.  No radiographic interpretation.   Note: Imaging results reviewed.  Meds   No outpatient prescriptions have been marked as taking for the 08/04/16 encounter (Appointment) with Milinda Pointer, MD.    ROS  Constitutional: Denies any fever or chills Gastrointestinal: No reported hemesis, hematochezia, vomiting, or acute GI distress Musculoskeletal: Denies any acute onset joint swelling, redness, loss of ROM, or weakness Neurological: No reported episodes of acute onset apraxia, aphasia, dysarthria, agnosia, amnesia, paralysis, loss of coordination, or loss of consciousness  Allergies  Ms. Ress is allergic to morphine; penicillins; and tramadol hcl.  [place]  Drug: Ms. Broadfoot  reports that she does not use drugs. Alcohol:  reports that she drinks alcohol. Tobacco:  reports that she has quit smoking. She has never used smokeless tobacco. Medical:  has a past medical history of ABDOMINAL PAIN OTHER SPECIFIED SITE (02/21/2009); ABDOMINAL PAIN-LUQ (04/17/2009); Anal fissure; Headache(784.0); HEMATOCHEZIA (04/17/2009); Hypertension; IBS  (irritable bowel syndrome); Migraine headache (11/06/2008); OVARIAN CYST, RIGHT (02/21/2009); and PALPITATIONS, OCCASIONAL (04/17/2010). Surgical: Ms. Cara  has a past surgical history that includes Cesarean section; Vaginal hysterectomy; Cholecystectomy; and Breast reduction surgery. Family: family history includes Cancer in her mother; Heart disease in her father; Hyperlipidemia in her father and mother; Hypertension in her father and mother.  Constitutional Exam  General appearance: Well nourished, well developed, and well hydrated. In no apparent acute distress There were no vitals filed for this visit. BMI Assessment: Estimated body mass index is 33.81 kg/m as calculated from the following:   Height as of 07/21/16: '5\' 4"'  (1.626 m).   Weight as of 07/21/16: 197 lb (89.4 kg).  BMI interpretation table: BMI level Category Range association with higher incidence of chronic pain  <18 kg/m2 Underweight   18.5-24.9 kg/m2 Ideal body weight   25-29.9 kg/m2 Overweight Increased incidence by 20%  30-34.9 kg/m2 Obese (Class I) Increased incidence by 68%  35-39.9 kg/m2 Severe obesity (Class II) Increased incidence by 136%  >40 kg/m2 Extreme obesity (Class III) Increased incidence by 254%   BMI Readings from Last 4 Encounters:  07/21/16 33.81 kg/m  06/23/16 32.61 kg/m  06/08/16 32.61 kg/m  04/28/16 31.24 kg/m   Wt Readings from Last 4 Encounters:  07/21/16 197 lb (89.4 kg)  06/23/16 190 lb (86.2 kg)  06/08/16 190 lb (86.2 kg)  04/28/16 182 lb (82.6 kg)  Psych/Mental status: Alert, oriented x 3 (person, place, & time)       Eyes: PERLA Respiratory: No evidence of acute respiratory distress  Cervical Spine Exam  Inspection: No masses, redness, or swelling Alignment: Symmetrical Functional ROM: Unrestricted ROM      Stability: No instability detected Muscle strength & Tone: Functionally intact Sensory: Unimpaired Palpation: No palpable anomalies              Upper Extremity (UE)  Exam    Side: Right upper extremity  Side: Left upper extremity  Inspection: No masses, redness, swelling, or asymmetry. No contractures  Inspection: No masses, redness, swelling, or asymmetry. No contractures  Functional ROM: Unrestricted ROM          Functional ROM: Unrestricted ROM          Muscle strength & Tone: Functionally intact  Muscle strength & Tone: Functionally intact  Sensory: Unimpaired  Sensory: Unimpaired  Palpation: No palpable anomalies              Palpation: No palpable anomalies              Specialized Test(s): Deferred         Specialized Test(s): Deferred  Thoracic Spine Exam  Inspection: No masses, redness, or swelling Alignment: Symmetrical Functional ROM: Improved after treatment Stability: No instability detected Sensory: Unimpaired Muscle strength & Tone: No palpable anomalies  Lumbar Spine Exam  Inspection: No masses, redness, or swelling Alignment: Symmetrical Functional ROM: Unrestricted ROM      Stability: No instability detected Muscle strength & Tone: Functionally intact Sensory: Unimpaired Palpation: No palpable anomalies       Provocative Tests: Lumbar Hyperextension and rotation test: evaluation deferred today       Patrick's Maneuver: evaluation deferred today                    Gait & Posture Assessment  Ambulation: Unassisted Gait: Relatively normal for age and body habitus Posture: WNL   Lower Extremity Exam    Side: Right lower extremity  Side: Left lower extremity  Inspection: No masses, redness, swelling, or asymmetry. No contractures  Inspection: No masses, redness, swelling, or asymmetry. No contractures  Functional ROM: Unrestricted ROM          Functional ROM: Unrestricted ROM          Muscle strength & Tone: Functionally intact  Muscle strength & Tone: Functionally intact  Sensory: Unimpaired  Sensory: Unimpaired  Palpation: No palpable anomalies  Palpation: No palpable anomalies   Assessment  Primary Diagnosis &  Pertinent Problem List: There were no encounter diagnoses.  Status Diagnosis  Controlled Controlled Controlled No diagnosis found.  Problems updated and reviewed during this visit: No problems updated. Plan of Care  Pharmacotherapy (Medications Ordered): No orders of the defined types were placed in this encounter.  New Prescriptions   No medications on file   Medications administered today: Ms. Kope had no medications administered during this visit.  Lab-work, procedure(s), and/or referral(s): No orders of the defined types were placed in this encounter.   Interventional management options: Planned, scheduled, and/or pending:   Not at this time.   Considering:   Diagnostic bilateral hip joint injection Palliative bilateral lumbar facet block  Palliative Bilateral lumbar facet radiofrequency ablation. (Last right-sided 06/23/2016 and left thigh 06/08/2016)    Palliative PRN treatment(s):   Diagnostic bilateral hip joint injection  Palliative Bilateral lumbar facet block plus left L1 transverse process injection   Provider-requested follow-up: No Follow-up on file.  Future Appointments Date Time Provider [place]  08/04/2016 9:00 AM Milinda Pointer, MD [facility] None  10/21/2016 8:30 AM Vevelyn Francois, NP [facility] None   Primary Care Physician: Leonel Ramsay, MD Location: [facility] Note by: Kathlen Brunswick Dossie Arbour, M.D, DABA, DABAPM, DABPM, DABIPP, FIPP Date: 08/04/2016; Time: 4:56 PM  There are no Patient Instructions on file for this visit.

## 2016-08-04 ENCOUNTER — Ambulatory Visit: Payer: BLUE CROSS/BLUE SHIELD | Attending: Pain Medicine | Admitting: Pain Medicine

## 2016-08-24 ENCOUNTER — Encounter: Payer: Self-pay | Admitting: Emergency Medicine

## 2016-08-24 ENCOUNTER — Emergency Department
Admission: EM | Admit: 2016-08-24 | Discharge: 2016-08-24 | Disposition: A | Discharge status: Home / Self Care | Payer: BLUE CROSS/BLUE SHIELD | Attending: Emergency Medicine | Admitting: Emergency Medicine

## 2016-08-24 ENCOUNTER — Emergency Department: Payer: BLUE CROSS/BLUE SHIELD

## 2016-08-24 DIAGNOSIS — I10 Essential (primary) hypertension: Secondary | ICD-10-CM | POA: Insufficient documentation

## 2016-08-24 DIAGNOSIS — G8929 Other chronic pain: Secondary | ICD-10-CM | POA: Insufficient documentation

## 2016-08-24 DIAGNOSIS — E1165 Type 2 diabetes mellitus with hyperglycemia: Secondary | ICD-10-CM | POA: Diagnosis not present

## 2016-08-24 DIAGNOSIS — R739 Hyperglycemia, unspecified: Secondary | ICD-10-CM

## 2016-08-24 DIAGNOSIS — Z79899 Other long term (current) drug therapy: Secondary | ICD-10-CM | POA: Insufficient documentation

## 2016-08-24 DIAGNOSIS — R9431 Abnormal electrocardiogram [ECG] [EKG]: Secondary | ICD-10-CM | POA: Diagnosis present

## 2016-08-24 DIAGNOSIS — R519 Headache, unspecified: Secondary | ICD-10-CM

## 2016-08-24 DIAGNOSIS — Z87891 Personal history of nicotine dependence: Secondary | ICD-10-CM | POA: Diagnosis not present

## 2016-08-24 DIAGNOSIS — R0602 Shortness of breath: Secondary | ICD-10-CM | POA: Insufficient documentation

## 2016-08-24 DIAGNOSIS — R51 Headache: Secondary | ICD-10-CM | POA: Insufficient documentation

## 2016-08-24 DIAGNOSIS — M5489 Other dorsalgia: Secondary | ICD-10-CM | POA: Diagnosis not present

## 2016-08-24 DIAGNOSIS — R0789 Other chest pain: Secondary | ICD-10-CM | POA: Insufficient documentation

## 2016-08-24 DIAGNOSIS — R079 Chest pain, unspecified: Secondary | ICD-10-CM

## 2016-08-24 LAB — BASIC METABOLIC PANEL
Anion gap: 11 (ref 5–15)
BUN: 14 mg/dL (ref 6–20)
CHLORIDE: 100 mmol/L — AB (ref 101–111)
CO2: 25 mmol/L (ref 22–32)
CREATININE: 0.73 mg/dL (ref 0.44–1.00)
Calcium: 9.2 mg/dL (ref 8.9–10.3)
GFR calc Af Amer: >60 mL/min (ref >60)
GFR calc non Af Amer: >60 mL/min (ref >60)
GLUCOSE: 236 mg/dL — AB (ref 65–99)
Potassium: 4.1 mmol/L (ref 3.5–5.1)
Sodium: 136 mmol/L (ref 135–145)

## 2016-08-24 LAB — CBC
HCT: 39.1 % (ref 35.0–47.0)
Hemoglobin: 13.9 g/dL (ref 12.0–16.0)
MCH: 31 pg (ref 26.0–34.0)
MCHC: 35.6 g/dL (ref 32.0–36.0)
MCV: 87.1 fL (ref 80.0–100.0)
PLATELETS: 278 10*3/uL (ref 150–440)
RBC: 4.49 MIL/uL (ref 3.80–5.20)
RDW: 14.4 % (ref 11.5–14.5)
WBC: 9.9 10*3/uL (ref 3.6–11.0)

## 2016-08-24 LAB — TROPONIN I: Troponin I: <0.03 ng/mL (ref <0.03)

## 2016-08-24 MED ORDER — METOPROLOL SUCCINATE ER 25 MG PO TB24: 25.0000 mg | ORAL_TABLET | Freq: Every day | ORAL | 0 refills | Status: DC

## 2016-08-24 MED ORDER — LISINOPRIL 5 MG PO TABS: 5.0000 mg | ORAL_TABLET | Freq: Every day | ORAL | 0 refills | Status: DC

## 2016-08-24 NOTE — Discharge Instructions (Signed)
Please seek medical attention for any high fevers, chest pain, shortness of breath, change in behavior, persistent vomiting, bloody stool or any other new or concerning symptoms.  

## 2016-08-24 NOTE — ED Notes (Signed)
Pt verbalizes understanding of d/c teaching and rx. Pt ambulatory, NAD noted

## 2016-08-24 NOTE — ED Triage Notes (Signed)
Pt in via POV with complaints of worsening chest pain x 2 weeks.  Pt reports associated shortness of breath, diaphoresis.  Pt seen by PCP today and sent over due to changes noticed in EKG.  Pt hypertensive, other vitals WDL, NAD noted at this time.

## 2016-08-24 NOTE — ED Notes (Signed)
ED Provider at bedside. 

## 2016-08-24 NOTE — ED Notes (Addendum)
Family at bedside. 

## 2016-08-24 NOTE — ED Provider Notes (Signed)
[facility] Emergency Department Provider Note   ____________________________________________   I have reviewed the triage vital signs and the nursing notes.   HISTORY  Chief Complaint Chest Pain   History limited by: Not Limited   HPI Kelly Rollins is a 46 y.o. female who presents to the emergency department today from primary care office because of concerns for elevated blood pressure and EKG changes. Patient states that she went to primary care because she has been having headaches for the past few weeks. She started checking her blood pressure noted that it was running high. Given this finding the patient was concerned that she needed blood pressure controls went to primary care. Primary care that they were concerned that they had an EKG with possible left atrial enlargement. The patient states she has been having some intermittent chest tightness. Is located in the left upper chest. They going on and off for 6 months. Getting slightly worse for the past few weeks however has not been accompanied by any cough or fever.   Past Medical History:  Diagnosis Date  . ABDOMINAL PAIN OTHER SPECIFIED SITE 02/21/2009   Qualifier: Diagnosis of  By: Deborra Medina MD, Tanja Port    . ABDOMINAL PAIN-LUQ 04/17/2009   Qualifier: Diagnosis of  By: Fuller Plan MD Marijo Conception Anal fissure   . Headache(784.0)   . HEMATOCHEZIA 04/17/2009   Qualifier: Diagnosis of  By: Fuller Plan MD Marijo Conception Hypertension   . IBS (irritable bowel syndrome)   . Migraine headache 11/06/2008   Qualifier: Diagnosis of  By: Deborra Medina MD, Tanja Port    . OVARIAN CYST, RIGHT 02/21/2009   Qualifier: Diagnosis of  By: Deborra Medina MD, Tanja Port    . PALPITATIONS, OCCASIONAL 04/17/2010   Qualifier: Diagnosis of  By: Deborra Medina MD, Talia      Patient Active Problem List   Diagnosis Date Noted  . Acute postoperative pain 06/08/2016  . Vitamin D insufficiency 05/04/2016  . Depression 02/04/2016  . Diabetes mellitus type 2,  uncomplicated ([place]) 91/47/8295  . Fatty liver 02/04/2016  . HTN (hypertension), benign 02/04/2016  . Chronic pain syndrome 01/15/2016  . Acute low back pain 05/28/2015  . Lumbar transverse process fracture ([place]) (Left L1) (03/11/15) 05/28/2015  . Long term current use of opiate analgesic 05/01/2015  . Long term prescription opiate use 05/01/2015  . Opiate use (25 MME/Day) 05/01/2015  . Encounter for therapeutic drug level monitoring 05/01/2015  . Encounter for pain management planning 05/01/2015  . Chronic low back pain (Location of Primary Source of Pain) (Bilateral) (L>R) 05/01/2015  . Lumbar facet syndrome (Location of Primary Source of Pain) (Bilateral) (L>R) 05/01/2015  . Chronic hip pain (Location of Secondary source of pain) (Bilateral) (L>R) 05/01/2015  . Chronic knee pain (Location of Tertiary source of pain) (Bilateral) (L>R) 05/01/2015  . Lumbar spondylosis 05/01/2015  . Neuropathic pain 05/01/2015  . Neurogenic pain 05/01/2015  . Myofascial pain 05/01/2015  . Essential hypertriglyceridemia 04/26/2014  . Anxiety 05/15/2010  . Obesity 05/15/2010  . Hyperlipidemia 04/03/2010  . Fatty liver disease 04/02/2010  . Pure hypercholesterolemia 03/19/2010  . GERD 04/17/2009  . Constipation 04/17/2009  . TOBACCO ABUSE 11/06/2008    Past Surgical History:  Procedure Laterality Date  . BREAST REDUCTION SURGERY     bilateral  . CESAREAN SECTION    . CHOLECYSTECTOMY    . VAGINAL HYSTERECTOMY      Prior to Admission medications   Medication Sig Start Date End Date Taking?  Authorizing Provider  albuterol (PROAIR HFA) 108 (90 Base) MCG/ACT inhaler Inhale 2 puffs into the lungs every 6 (six) hours as needed.  05/03/15 05/02/16  [provider]  Ascorbic Acid (VITAMIN C) 100 MG tablet Take 100 mg by mouth daily.    [provider]  Aspirin-Acetaminophen-Caffeine (EXCEDRIN EXTRA STRENGTH PO) Take 2 tablets by mouth as needed.    [provider]   Cholecalciferol (VITAMIN D3) 1000 units CAPS Take by mouth daily.    [provider]  gabapentin (NEURONTIN) 300 MG capsule Take 1 capsule (300 mg total) by mouth every 8 (eight) hours. 07/21/16   Vevelyn Francois, NP  HYDROcodone-acetaminophen (NORCO/VICODIN) 5-325 MG tablet Take 1 tablet by mouth every 6 (six) hours as needed for moderate pain. Do not add this medication to the electronic "Automatic Refill" notification system. Patient may have prescription filled one day early if pharmacy is closed on scheduled refill date. Do not fill until: 09/21/16 To last until: 10/21/16 09/21/16 10/21/16  Vevelyn Francois, NP  Magnesium 250 MG TABS Take by mouth at bedtime.    [provider]  Multiple Vitamin (MULTIVITAMIN) tablet Take 1 tablet by mouth daily.    [provider]    Allergies Morphine; Penicillins; and Tramadol hcl  Family History  Problem Relation Age of Onset  . Cancer Mother        throat in the 2's  . Hyperlipidemia Mother   . Hypertension Mother   . Hyperlipidemia Father   . Hypertension Father   . Heart disease Father        S/P two stents    Social History Social History  Substance Use Topics  . Smoking status: Former Smoker    Types: Cigarettes    Quit date: 09/03/2014  . Smokeless tobacco: Never Used  . Alcohol use Yes     Comment: occassional    Review of Systems Constitutional: No fever/chills Eyes: No visual changes. ENT: No sore throat. Cardiovascular:Positive for chest tightness Respiratory: Positive for shortness of breath. Gastrointestinal: No abdominal pain.  No nausea, no vomiting.  No diarrhea.   Genitourinary: Negative for dysuria. Musculoskeletal: Positive for chronic back pain Skin: Negative for rash. Neurological: Positive for headaches  ____________________________________________   PHYSICAL EXAM:  VITAL SIGNS: ED Triage Vitals  Enc Vitals Group     BP 08/24/16 1429 (!) 171/101     Pulse Rate 08/24/16 1429  100     Resp 08/24/16 1429 20     Temp 08/24/16 1429 99 F (37.2 C)     Temp Source 08/24/16 1429 Oral     SpO2 08/24/16 1429 96 %     Weight 08/24/16 1430 185 lb (83.9 kg)     Height 08/24/16 1430 5\' 4"  (1.626 m)     Head Circumference --      Peak Flow --      Pain Score 08/24/16 1429 2   Constitutional: Alert and oriented. Well appearing and in no distress. Eyes: Conjunctivae are normal.  ENT   Head: Normocephalic and atraumatic.   Nose: No congestion/rhinnorhea.   Mouth/Throat: Mucous membranes are moist.   Neck: No stridor. Hematological/Lymphatic/Immunilogical: No cervical lymphadenopathy. Cardiovascular: Normal rate, regular rhythm.  No murmurs, rubs, or gallops.  Respiratory: Normal respiratory effort without tachypnea nor retractions. Breath sounds are clear and equal bilaterally. No wheezes/rales/rhonchi. Gastrointestinal: Soft and non tender. No rebound. No guarding.  Genitourinary: Deferred Musculoskeletal: Normal range of motion in all extremities. No lower extremity edema. Neurologic:  Normal speech and language. No gross focal neurologic deficits are appreciated.  Skin:  Skin is warm, dry and intact. No rash noted. Psychiatric: Mood and affect are normal. Speech and behavior are normal. Patient exhibits appropriate insight and judgment.  ____________________________________________    LABS (pertinent positives/negatives)  Labs Reviewed  BASIC METABOLIC PANEL - Abnormal; Notable for the following:       Result Value   Chloride 100 (*)    Glucose, Bld 236 (*)    All other components within normal limits  CBC  TROPONIN I     ____________________________________________   EKG  I, Nance Pear, attending physician, personally viewed and interpreted this EKG  EKG Time: 1436 Rate: 100 Rhythm: normal sinus rhythm Axis: normal Intervals: qtc 417 QRS: narrow ST changes: no st elevation Impression: normal  ekg   ____________________________________________    RADIOLOGY  CXR IMPRESSION:  No active cardiopulmonary disease.     ____________________________________________   PROCEDURES  Procedures  ____________________________________________   INITIAL IMPRESSION / ASSESSMENT AND PLAN / ED COURSE  Pertinent labs & imaging results that were available during my care of the patient were reviewed by me and considered in my medical decision making (see chart for details).  Patient presented to the emergency department today because of concerns for EKG change and high blood pressure sent from primary care. EKG today does not appear to have any significant change from EKG taken at 2015. At the time of exam patient was feeling better. Will start patient on low-dose antihypertensive medications. Also discussed with patient the fact that her blood sugars were high and that she would need follow-up with her primary care for further evaluation.  ____________________________________________   FINAL CLINICAL IMPRESSION(S) / ED DIAGNOSES  Final diagnoses:  Nonspecific chest pain  Hypertension, unspecified type  High blood sugar  Nonintractable headache, unspecified chronicity pattern, unspecified headache type     Note: This dictation was prepared with Dragon dictation. Any transcriptional errors that result from this process are unintentional     Nance Pear, MD 08/24/16 [phone]

## 2016-09-08 ENCOUNTER — Other Ambulatory Visit: Payer: Self-pay | Admitting: Nurse Practitioner

## 2016-09-08 ENCOUNTER — Ambulatory Visit: Payer: BLUE CROSS/BLUE SHIELD | Attending: Nurse Practitioner | Admitting: Nurse Practitioner

## 2016-09-08 ENCOUNTER — Encounter: Payer: Self-pay | Admitting: Nurse Practitioner

## 2016-09-08 VITALS — BP 163/93 | HR 98 | Temp 98.2°F | Resp 16 | Ht 64.0 in | Wt 178.0 lb

## 2016-09-08 DIAGNOSIS — Z79891 Long term (current) use of opiate analgesic: Secondary | ICD-10-CM | POA: Insufficient documentation

## 2016-09-08 DIAGNOSIS — M2578 Osteophyte, vertebrae: Secondary | ICD-10-CM | POA: Insufficient documentation

## 2016-09-08 DIAGNOSIS — M545 Low back pain: Secondary | ICD-10-CM | POA: Diagnosis not present

## 2016-09-08 DIAGNOSIS — M25562 Pain in left knee: Secondary | ICD-10-CM | POA: Diagnosis not present

## 2016-09-08 DIAGNOSIS — Z9049 Acquired absence of other specified parts of digestive tract: Secondary | ICD-10-CM | POA: Insufficient documentation

## 2016-09-08 DIAGNOSIS — M791 Myalgia: Secondary | ICD-10-CM | POA: Insufficient documentation

## 2016-09-08 DIAGNOSIS — E669 Obesity, unspecified: Secondary | ICD-10-CM | POA: Insufficient documentation

## 2016-09-08 DIAGNOSIS — M4726 Other spondylosis with radiculopathy, lumbar region: Secondary | ICD-10-CM | POA: Insufficient documentation

## 2016-09-08 DIAGNOSIS — M25551 Pain in right hip: Secondary | ICD-10-CM | POA: Diagnosis not present

## 2016-09-08 DIAGNOSIS — E785 Hyperlipidemia, unspecified: Secondary | ICD-10-CM | POA: Diagnosis not present

## 2016-09-08 DIAGNOSIS — E7800 Pure hypercholesterolemia, unspecified: Secondary | ICD-10-CM | POA: Diagnosis not present

## 2016-09-08 DIAGNOSIS — K5900 Constipation, unspecified: Secondary | ICD-10-CM | POA: Diagnosis not present

## 2016-09-08 DIAGNOSIS — Z90710 Acquired absence of both cervix and uterus: Secondary | ICD-10-CM | POA: Insufficient documentation

## 2016-09-08 DIAGNOSIS — M5416 Radiculopathy, lumbar region: Secondary | ICD-10-CM | POA: Insufficient documentation

## 2016-09-08 DIAGNOSIS — M8929 Other disorders of bone development and growth, multiple sites: Secondary | ICD-10-CM | POA: Diagnosis not present

## 2016-09-08 DIAGNOSIS — Z87891 Personal history of nicotine dependence: Secondary | ICD-10-CM | POA: Insufficient documentation

## 2016-09-08 DIAGNOSIS — F419 Anxiety disorder, unspecified: Secondary | ICD-10-CM | POA: Insufficient documentation

## 2016-09-08 DIAGNOSIS — M4696 Unspecified inflammatory spondylopathy, lumbar region: Secondary | ICD-10-CM | POA: Insufficient documentation

## 2016-09-08 DIAGNOSIS — K760 Fatty (change of) liver, not elsewhere classified: Secondary | ICD-10-CM | POA: Diagnosis not present

## 2016-09-08 DIAGNOSIS — F329 Major depressive disorder, single episode, unspecified: Secondary | ICD-10-CM | POA: Diagnosis not present

## 2016-09-08 DIAGNOSIS — K219 Gastro-esophageal reflux disease without esophagitis: Secondary | ICD-10-CM | POA: Diagnosis not present

## 2016-09-08 DIAGNOSIS — M792 Neuralgia and neuritis, unspecified: Secondary | ICD-10-CM | POA: Diagnosis not present

## 2016-09-08 DIAGNOSIS — M25561 Pain in right knee: Secondary | ICD-10-CM | POA: Insufficient documentation

## 2016-09-08 DIAGNOSIS — K589 Irritable bowel syndrome without diarrhea: Secondary | ICD-10-CM | POA: Insufficient documentation

## 2016-09-08 DIAGNOSIS — M25552 Pain in left hip: Secondary | ICD-10-CM | POA: Diagnosis not present

## 2016-09-08 DIAGNOSIS — E781 Pure hyperglyceridemia: Secondary | ICD-10-CM | POA: Insufficient documentation

## 2016-09-08 DIAGNOSIS — Z7982 Long term (current) use of aspirin: Secondary | ICD-10-CM | POA: Insufficient documentation

## 2016-09-08 DIAGNOSIS — I10 Essential (primary) hypertension: Secondary | ICD-10-CM | POA: Insufficient documentation

## 2016-09-08 DIAGNOSIS — E559 Vitamin D deficiency, unspecified: Secondary | ICD-10-CM | POA: Insufficient documentation

## 2016-09-08 DIAGNOSIS — G894 Chronic pain syndrome: Secondary | ICD-10-CM | POA: Diagnosis not present

## 2016-09-08 DIAGNOSIS — M47816 Spondylosis without myelopathy or radiculopathy, lumbar region: Secondary | ICD-10-CM | POA: Diagnosis not present

## 2016-09-08 DIAGNOSIS — M7918 Myalgia, other site: Secondary | ICD-10-CM

## 2016-09-08 DIAGNOSIS — E119 Type 2 diabetes mellitus without complications: Secondary | ICD-10-CM | POA: Diagnosis not present

## 2016-09-08 MED ORDER — OXYCODONE HCL 5 MG PO TABS: 5.0000 mg | ORAL_TABLET | Freq: Four times a day (QID) | ORAL | 0 refills | Status: DC | PRN

## 2016-09-08 MED ORDER — ORPHENADRINE CITRATE 30 MG/ML IJ SOLN
60.0000 mg | Freq: Once | INTRAMUSCULAR | Status: AC
  Administered 2016-09-08: 60 mg via INTRAMUSCULAR
  Filled 2016-09-08: qty 2

## 2016-09-08 MED ORDER — KETOROLAC TROMETHAMINE 60 MG/2ML IM SOLN
60.0000 mg | Freq: Once | INTRAMUSCULAR | Status: AC
  Administered 2016-09-08: 60 mg via INTRAMUSCULAR
  Filled 2016-09-08: qty 2

## 2016-09-08 NOTE — Progress Notes (Signed)
Patient's Name: Kelly Rollins  MRN: [id number]  Referring Provider: Leonel Ramsay, MD  DOB: Mar 02, 1970  PCP: Leonel Ramsay, MD  DOS: 09/08/2016  Note by: Vevelyn Francois NP  Service setting: Ambulatory outpatient  Specialty: Interventional Pain Management  Location: [facility] (AMB) Pain Management Facility    Patient type: Established    Primary Reason(s) for Visit: Evaluation of chronic illnesses with exacerbation, or progression (Level of risk: moderate) CC: Back Pain (lower back)  HPI  Kelly Rollins is a 46 y.o. year old, female patient, who comes today for a follow-up evaluation. She has TOBACCO ABUSE; GERD; Constipation; Pure hypercholesterolemia; Hyperlipidemia; Fatty liver disease; Anxiety; Obesity; Essential hypertriglyceridemia; Long term current use of opiate analgesic; Long term prescription opiate use; Opiate use (25 MME/Day); Encounter for therapeutic drug level monitoring; Encounter for pain management planning; Chronic low back pain (Location of Primary Source of Pain) (Bilateral) (L>R); Lumbar facet syndrome (Location of Primary Source of Pain) (Bilateral) (L>R); Chronic hip pain (Location of Secondary source of pain) (Bilateral) (L>R); Chronic knee pain (Location of Tertiary source of pain) (Bilateral) (L>R); Lumbar spondylosis; Neuropathic pain; Neurogenic pain; Myofascial pain; Acute low back pain; Lumbar transverse process fracture (HCC) (Left L1) (03/11/15); Chronic pain syndrome; Depression; Diabetes mellitus type 2, uncomplicated ([place]); Fatty liver; HTN (hypertension), benign; Vitamin D insufficiency; Acute postoperative pain; and Lumbar radiculopathy on her problem list. Kelly Rollins was last seen on 07/21/2016. Her primarily concern today is the Back Pain (lower back)  Pain Assessment: Location: Lower Back Radiating: pain starting to go down the side of right leg to past the knee (on the left side, the pain goes down to the hip and stops there) Onset: More than a month  ago Duration: Chronic pain Quality: Aching, Constant, Sharp, Radiating, Penetrating ("like a bone ache") Severity: 4 /10 (self-reported pain score)  Note: Reported level is compatible with observation.                   Effect on ADL: pace self Timing: Constant Modifying factors: medicine (medicine not relieving the pain)  She states that she is not having effective pain relief with the Norco. She is taking more medication than she is suppose to. She states that the RFA is more intense. She did not follow up a scheduled. She is having pain that is going down her right leg into her knee. She was to start the Gabapentin however she did not do this. She does use the back brace. She is wanting to go back on the Oxycodone.  Laboratory Chemistry  Inflammation Markers (CRP: Acute Phase) (ESR: Chronic Phase) Lab Results  Component Value Date   CRP 2.5 (H) 04/28/2016   ESRSEDRATE 40 (H) 04/28/2016                 Renal Function Markers Lab Results  Component Value Date   BUN 14 08/24/2016   CREATININE 0.73 08/24/2016   GFRAA >60 08/24/2016   GFRNONAA >60 08/24/2016                 Hepatic Function Markers Lab Results  Component Value Date   AST 47 (H) 04/28/2016   ALT 57 (H) 04/28/2016   ALBUMIN 4.0 04/28/2016   ALKPHOS 78 04/28/2016                 Electrolytes Lab Results  Component Value Date   NA 136 08/24/2016   K 4.1 08/24/2016   CL 100 (L) 08/24/2016   CALCIUM  9.2 08/24/2016   MG 2.1 04/28/2016                 Neuropathy Markers Lab Results  Component Value Date   VITAMINB12 638 04/28/2016                 Bone Pathology Markers Lab Results  Component Value Date   ALKPHOS 78 04/28/2016   25OHVITD1 27 (L) 04/28/2016   25OHVITD2 3.7 04/28/2016   25OHVITD3 23 04/28/2016   CALCIUM 9.2 08/24/2016                 Coagulation Parameters Lab Results  Component Value Date   PLT 278 08/24/2016                 Cardiovascular Markers Lab Results  Component  Value Date   HGB 13.9 08/24/2016   HCT 39.1 08/24/2016                 Note: Lab results reviewed.  Recent Diagnostic Imaging Review  Dg Chest 2 View  Result Date: 08/24/2016 CLINICAL DATA:  Chest pain EXAM: CHEST  2 VIEW COMPARISON:  None. FINDINGS: The heart size and mediastinal contours are within normal limits. Both lungs are clear. The visualized skeletal structures are unremarkable. IMPRESSION: No active cardiopulmonary disease. Electronically Signed   By: Kerby Moors M.D.   On: 08/24/2016 15:02   Cervical Imaging:  Cervical CT wo contrast:  Results for orders placed during the hospital encounter of 03/11/15  CT Cervical Spine Wo Contrast   Narrative CLINICAL DATA:  Pain following fall  EXAM: CT CERVICAL, THORACIC, AND LUMBAR SPINE WITHOUT CONTRAST  TECHNIQUE: Multidetector CT imaging of the cervical, thoracic and lumbar spine was performed without intravenous contrast. Multiplanar CT image reconstructions were also generated.  COMPARISON:  None.  FINDINGS: CT CERVICAL SPINE FINDINGS  There is no fracture or spondylolisthesis. Prevertebral soft tissues and predental space regions are normal. The disc spaces appear normal. No nerve root edema or effacement. No disc extrusion or stenosis.  The visualized brain parenchyma appears normal. The visualized mastoid air cells are clear. Visualized lung apices are clear.  CT THORACIC SPINE FINDINGS  There is no demonstrable fracture or spondylolisthesis. There is slight mid thoracic dextroscoliosis. There are multiple anterior osteophytes. There is slight disc space narrowing at several levels in the lower thoracic region. There is mild facet hypertrophy at several levels without nerve root edema or effacement. No disc extrusion or stenosis is evident. The visualized lung regions appear clear. No paraspinous lesions are appreciable.  CT LUMBAR SPINE FINDINGS  There is a nondisplaced fracture of the lateral aspect of  the left L1 transverse process. No other fracture is evident. There is no spondylolisthesis. The disc spaces appear normal. No paraspinous lesions are identified. No nerve root edema or effacement. No disc extrusions or stenosis.  IMPRESSION: CT cervical spine: No fracture or spondylolisthesis. No appreciable arthropathy.  CT thoracic spine: No fracture or spondylolisthesis. Mild osteoarthritic change at multiple levels. No disc extrusion or stenosis. No nerve root edema or effacement.  CT lumbar spine: Nondisplaced fracture L1 transverse process on the left. No other evidence of fracture. No spondylolisthesis. No appreciable disc space narrowing. No disc extrusion or stenosis.   Electronically Signed   By: Lowella Grip III M.D.   On: 03/11/2015 09:26    Thoracic Imaging:  Thoracic CT wo contrast:  Results for orders placed during the hospital encounter of 03/11/15  CT Thoracic Spine Wo Contrast  Narrative CLINICAL DATA:  Pain following fall  EXAM: CT CERVICAL, THORACIC, AND LUMBAR SPINE WITHOUT CONTRAST  TECHNIQUE: Multidetector CT imaging of the cervical, thoracic and lumbar spine was performed without intravenous contrast. Multiplanar CT image reconstructions were also generated.  COMPARISON:  None.  FINDINGS: CT CERVICAL SPINE FINDINGS  There is no fracture or spondylolisthesis. Prevertebral soft tissues and predental space regions are normal. The disc spaces appear normal. No nerve root edema or effacement. No disc extrusion or stenosis.  The visualized brain parenchyma appears normal. The visualized mastoid air cells are clear. Visualized lung apices are clear.  CT THORACIC SPINE FINDINGS  There is no demonstrable fracture or spondylolisthesis. There is slight mid thoracic dextroscoliosis. There are multiple anterior osteophytes. There is slight disc space narrowing at several levels in the lower thoracic region. There is mild facet hypertrophy  at several levels without nerve root edema or effacement. No disc extrusion or stenosis is evident. The visualized lung regions appear clear. No paraspinous lesions are appreciable.  CT LUMBAR SPINE FINDINGS  There is a nondisplaced fracture of the lateral aspect of the left L1 transverse process. No other fracture is evident. There is no spondylolisthesis. The disc spaces appear normal. No paraspinous lesions are identified. No nerve root edema or effacement. No disc extrusions or stenosis.  IMPRESSION: CT cervical spine: No fracture or spondylolisthesis. No appreciable arthropathy.  CT thoracic spine: No fracture or spondylolisthesis. Mild osteoarthritic change at multiple levels. No disc extrusion or stenosis. No nerve root edema or effacement.  CT lumbar spine: Nondisplaced fracture L1 transverse process on the left. No other evidence of fracture. No spondylolisthesis. No appreciable disc space narrowing. No disc extrusion or stenosis.   Electronically Signed   By: Lowella Grip III M.D.   On: 03/11/2015 09:26     Lumbosacral Imaging:  Lumbar CT wo contrast:  Results for orders placed during the hospital encounter of 03/11/15  CT Lumbar Spine Wo Contrast   Narrative CLINICAL DATA:  Pain following fall  EXAM: CT CERVICAL, THORACIC, AND LUMBAR SPINE WITHOUT CONTRAST  TECHNIQUE: Multidetector CT imaging of the cervical, thoracic and lumbar spine was performed without intravenous contrast. Multiplanar CT image reconstructions were also generated.  COMPARISON:  None.  FINDINGS: CT CERVICAL SPINE FINDINGS  There is no fracture or spondylolisthesis. Prevertebral soft tissues and predental space regions are normal. The disc spaces appear normal. No nerve root edema or effacement. No disc extrusion or stenosis.  The visualized brain parenchyma appears normal. The visualized mastoid air cells are clear. Visualized lung apices are clear.  CT THORACIC SPINE  FINDINGS  There is no demonstrable fracture or spondylolisthesis. There is slight mid thoracic dextroscoliosis. There are multiple anterior osteophytes. There is slight disc space narrowing at several levels in the lower thoracic region. There is mild facet hypertrophy at several levels without nerve root edema or effacement. No disc extrusion or stenosis is evident. The visualized lung regions appear clear. No paraspinous lesions are appreciable.  CT LUMBAR SPINE FINDINGS  There is a nondisplaced fracture of the lateral aspect of the left L1 transverse process. No other fracture is evident. There is no spondylolisthesis. The disc spaces appear normal. No paraspinous lesions are identified. No nerve root edema or effacement. No disc extrusions or stenosis.  IMPRESSION: CT cervical spine: No fracture or spondylolisthesis. No appreciable arthropathy.  CT thoracic spine: No fracture or spondylolisthesis. Mild osteoarthritic change at multiple levels. No disc extrusion or stenosis. No nerve root edema or effacement.  CT  lumbar spine: Nondisplaced fracture L1 transverse process on the left. No other evidence of fracture. No spondylolisthesis. No appreciable disc space narrowing. No disc extrusion or stenosis.   Electronically Signed   By: Lowella Grip III M.D.   On: 03/11/2015 09:26     Sacroiliac Joint Imaging: Sacroiliac Joint DG:  Results for orders placed in visit on 04/27/13  DG Si Joints   Narrative * PRIOR REPORT IMPORTED FROM AN EXTERNAL SYSTEM *   CLINICAL DATA:  Bilateral hip pain   EXAM:  BILATERAL SACROILIAC JOINTS - 3+ VIEW   COMPARISON:  None.   FINDINGS:  The sacroiliac joint spaces are maintained and there is no evidence  of arthropathy. No other bone abnormalities are seen.   IMPRESSION:  Negative.    Electronically Signed    By: Margaree Mackintosh M.D.    On: 04/27/2013 15:52       Note: Results of ordered imaging test(s) reviewed and  explained to patient in Layman's terms. Copy of results provided to patient  Meds   Current Meds  Medication Sig  . Ascorbic Acid (VITAMIN C) 100 MG tablet Take 100 mg by mouth daily.  . Aspirin-Acetaminophen-Caffeine (EXCEDRIN EXTRA STRENGTH PO) Take 2 tablets by mouth as needed.  . Cholecalciferol (VITAMIN D3) 1000 units CAPS Take by mouth daily.  Derrill Memo ON 09/21/2016] HYDROcodone-acetaminophen (NORCO/VICODIN) 5-325 MG tablet Take 1 tablet by mouth every 6 (six) hours as needed for moderate pain. Do not add this medication to the electronic "Automatic Refill" notification system. Patient may have prescription filled one day early if pharmacy is closed on scheduled refill date. Do not fill until: 09/21/16 To last until: 10/21/16  . ibuprofen (ADVIL,MOTRIN) 200 MG tablet Take by mouth as needed.   . Magnesium 250 MG TABS Take by mouth at bedtime.  . metoprolol succinate (TOPROL-XL) 50 MG 24 hr tablet 50 mg daily.   . Multiple Vitamin (MULTIVITAMIN) tablet Take 1 tablet by mouth daily.    ROS  Constitutional: Denies any fever or chills Gastrointestinal: No reported hemesis, hematochezia, vomiting, or acute GI distress Musculoskeletal: Denies any acute onset joint swelling, redness, loss of ROM, or weakness Neurological: No reported episodes of acute onset apraxia, aphasia, dysarthria, agnosia, amnesia, paralysis, loss of coordination, or loss of consciousness  Allergies  Kelly Rollins is allergic to morphine; penicillins; and tramadol hcl.  Kelly Rollins  Drug: Ms. Bougher  reports that she does not use drugs. Alcohol:  reports that she drinks alcohol. Tobacco:  reports that she quit smoking about 2 years ago. Her smoking use included Cigarettes. She has never used smokeless tobacco. Medical:  has a past medical history of ABDOMINAL PAIN OTHER SPECIFIED SITE (02/21/2009); ABDOMINAL PAIN-LUQ (04/17/2009); Anal fissure; Headache(784.0); HEMATOCHEZIA (04/17/2009); Hypertension; IBS (irritable bowel  syndrome); Migraine headache (11/06/2008); OVARIAN CYST, RIGHT (02/21/2009); and PALPITATIONS, OCCASIONAL (04/17/2010). Surgical: Ms. Waterfield  has a past surgical history that includes Cesarean section; Vaginal hysterectomy; Cholecystectomy; and Breast reduction surgery. Family: family history includes Cancer in her mother; Heart disease in her father; Hyperlipidemia in her father and mother; Hypertension in her father and mother.  Constitutional Exam  General appearance: Well nourished, well developed, and well hydrated. In no apparent acute distress Vitals:   09/08/16 1011  BP: (!) 163/93  Pulse: 98  Resp: 16  Temp: 98.2 F (36.8 C)  SpO2: 99%  Weight: 178 lb (80.7 kg)  Height: '5\' 4"'  (1.626 m)   BMI Assessment: Estimated body mass index is 30.55 kg/m as calculated from  the following:   Height as of this encounter: '5\' 4"'  (1.626 m).   Weight as of this encounter: 178 lb (80.7 kg).  BMI interpretation table: BMI level Category Range association with higher incidence of chronic pain  <18 kg/m2 Underweight   18.5-24.9 kg/m2 Ideal body weight   25-29.9 kg/m2 Overweight Increased incidence by 20%  30-34.9 kg/m2 Obese (Class I) Increased incidence by 68%  35-39.9 kg/m2 Severe obesity (Class II) Increased incidence by 136%  >40 kg/m2 Extreme obesity (Class III) Increased incidence by 254%   BMI Readings from Last 4 Encounters:  09/08/16 30.55 kg/m  08/24/16 31.76 kg/m  07/21/16 33.81 kg/m  06/23/16 32.61 kg/m   Wt Readings from Last 4 Encounters:  09/08/16 178 lb (80.7 kg)  08/24/16 185 lb (83.9 kg)  07/21/16 197 lb (89.4 kg)  06/23/16 190 lb (86.2 kg)  Psych/Mental status: Alert, oriented x 3 (person, place, & time)       Eyes: PERLA Respiratory: No evidence of acute respiratory distress  Cervical Spine Area Exam  Skin & Axial Inspection: No masses, redness, edema, swelling, or associated skin lesions Alignment: Symmetrical Functional ROM: Unrestricted ROM      Stability:  No instability detected Muscle Tone/Strength: Functionally intact. No obvious neuro-muscular anomalies detected. Sensory (Neurological): Unimpaired Palpation: No palpable anomalies              Upper Extremity (UE) Exam    Side: Right upper extremity  Side: Left upper extremity  Skin & Extremity Inspection: Skin color, temperature, and hair growth are WNL. No peripheral edema or cyanosis. No masses, redness, swelling, asymmetry, or associated skin lesions. No contractures.  Skin & Extremity Inspection: Skin color, temperature, and hair growth are WNL. No peripheral edema or cyanosis. No masses, redness, swelling, asymmetry, or associated skin lesions. No contractures.  Functional ROM: Unrestricted ROM          Functional ROM: Unrestricted ROM          Muscle Tone/Strength: Functionally intact. No obvious neuro-muscular anomalies detected.  Muscle Tone/Strength: Functionally intact. No obvious neuro-muscular anomalies detected.  Sensory (Neurological): Unimpaired  Sensory (Neurological): Unimpaired  Palpation: No palpable anomalies              Palpation: No palpable anomalies              Specialized Test(s): Deferred         Specialized Test(s): Deferred          Thoracic Spine Area Exam  Skin & Axial Inspection: No masses, redness, or swelling Alignment: Symmetrical Functional ROM: Unrestricted ROM Stability: No instability detected Muscle Tone/Strength: Functionally intact. No obvious neuro-muscular anomalies detected. Sensory (Neurological): Unimpaired Muscle strength & Tone: No palpable anomalies  Lumbar Spine Area Exam  Skin & Axial Inspection: No masses, redness, or swelling Alignment: Symmetrical Functional ROM: Unrestricted ROM      Stability: No instability detected Muscle Tone/Strength: Functionally intact. No obvious neuro-muscular anomalies detected. Sensory (Neurological): Unimpaired Palpation: Complains of area being tender to palpation       Provocative Tests: Lumbar  Hyperextension and rotation test: Positive       Lumbar Lateral bending test: evaluation deferred today       Patrick's Maneuver: evaluation deferred today                    Gait & Posture Assessment  Ambulation: Unassisted Gait: Relatively normal for age and body habitus Posture: WNL   Lower Extremity Exam    Side:  Right lower extremity  Side: Left lower extremity  Skin & Extremity Inspection: Skin color, temperature, and hair growth are WNL. No peripheral edema or cyanosis. No masses, redness, swelling, asymmetry, or associated skin lesions. No contractures.  Skin & Extremity Inspection: Skin color, temperature, and hair growth are WNL. No peripheral edema or cyanosis. No masses, redness, swelling, asymmetry, or associated skin lesions. No contractures.  Functional ROM: Unrestricted ROM          Functional ROM: Unrestricted ROM          Muscle Tone/Strength: Able to Toe-walk & Heel-walk without problems  Muscle Tone/Strength: Able to Toe-walk & Heel-walk without problems  Sensory (Neurological): Unimpaired  Sensory (Neurological): Unimpaired  Palpation: No palpable anomalies  Palpation: No palpable anomalies   Assessment  Primary Diagnosis & Pertinent Problem List: The primary encounter diagnosis was Lumbar facet syndrome (Location of Primary Source of Pain) (Bilateral) (L>R). Diagnoses of Chronic pain syndrome, Myofascial pain, Lumbar radiculopathy, Lumbar spondylosis, and Neuropathic pain were also pertinent to this visit.  Status Diagnosis  Controlled Controlled Controlled 1. Lumbar facet syndrome (Location of Primary Source of Pain) (Bilateral) (L>R)   2. Chronic pain syndrome   3. Myofascial pain   4. Lumbar radiculopathy   5. Lumbar spondylosis   6. Neuropathic pain     Problems updated and reviewed during this visit: Problem  Lumbar Radiculopathy   Plan of Care  Pharmacotherapy (Medications Ordered): Meds ordered this encounter  Medications  . oxyCODONE (OXY  IR/ROXICODONE) 5 MG immediate release tablet    Sig: Take 1 tablet (5 mg total) by mouth every 6 (six) hours as needed for severe pain.    Dispense:  120 tablet    Refill:  0    Fill one day early if pharmacy is closed on scheduled refill date. Do not fill until:09/21/2016 To last until:10/21/2016    Order Specific Question:   Supervising Provider    Answer:   Milinda Pointer [phone]  . orphenadrine (NORFLEX) injection 60 mg  . ketorolac (TORADOL) injection 60 mg   New Prescriptions   No medications on file   Medications administered today: We administered orphenadrine and ketorolac. Lab-work, procedure(s), and/or referral(s): Orders Placed This Encounter  Procedures  . DG Lumbar Spine Complete W/Bend  . MR LUMBAR SPINE WO CONTRAST  . Comprehensive metabolic panel   Imaging and/or referral(s): DG LUMBAR SPINE COMPLETE W/BEND 6+V MR LUMBAR SPINE WO CONTRAST  Interventional therapies: Planned, scheduled, and/or pending:   Updating imaging Instructed to start gabapentin as directed Oxycodone restarted to be filled on due date. Instructed on Medication policy Encouraged physical therapy on hold at this time secondary to cost    Considering Diagnostic bilateral hip joint injection Palliative bilateral lumbar facet block  Bilateral lumbar facet radiofrequency ablation.    Palliative PRN treatment(s):   Diagnostic bilateral hip joint injection  Palliative Bilateral lumbar facet block plus left L1 transverse process injection   Provider-requested follow-up: No Follow-up on file.  Future Appointments Date Time Provider [place]  10/21/2016 8:30 AM Vevelyn Francois, NP [facility] None   Primary Care Physician: Leonel Ramsay, MD Location: [facility] Note by: Vevelyn Francois NP Date: 09/08/2016; Time: 3:47 PM  Pain Score Disclaimer: We use the NRS-11 scale. This is a self-reported, subjective measurement of pain severity with only  modest accuracy. It is used primarily to identify changes within a particular patient. It must be understood that outpatient pain scales are significantly less accurate that  those used for research, where they can be applied under ideal controlled circumstances with minimal exposure to variables. In reality, the score is likely to be a combination of pain intensity and pain affect, where pain affect describes the degree of emotional arousal or changes in action readiness caused by the sensory experience of pain. Factors such as social and work situation, setting, emotional state, anxiety levels, expectation, and prior pain experience may influence pain perception and show large inter-individual differences that may also be affected by time variables.  Patient instructions provided during this appointment: Patient Instructions   ____________________________________________________________________________________________  Medication Rules  Applies to: All patients receiving prescriptions (written or electronic).  Pharmacy of record: Pharmacy where electronic prescriptions will be sent. If written prescriptions are taken to a different pharmacy, please inform the nursing staff. The pharmacy listed in the electronic medical record should be the one where you would like electronic prescriptions to be sent.  Prescription refills: Only during scheduled appointments. Applies to both, written and electronic prescriptions.  NOTE: The following applies primarily to controlled substances (Opioid* Pain Medications).   Patient's responsibilities: 1. Pain Pills: Bring all pain pills to every appointment (except for procedure appointments). 2. Pill Bottles: Bring pills in original pharmacy bottle. Always bring newest bottle. Bring bottle, even if empty. 3. Medication refills: You are responsible for knowing and keeping track of what medications you need refilled. The day before your appointment, write a list of  all prescriptions that need to be refilled. Bring that list to your appointment and give it to the admitting nurse. Prescriptions will be written only during appointments. If you forget a medication, it will not be "Called in", "Faxed", or "electronically sent". You will need to get another appointment to get these prescribed. 4. Prescription Accuracy: You are responsible for carefully inspecting your prescriptions before leaving our office. Have the discharge nurse carefully go over each prescription with you, before taking them home. Make sure that your name is accurately spelled, that your address is correct. Check the name and dose of your medication to make sure it is accurate. Check the number of pills, and the written instructions to make sure they are clear and accurate. Make sure that you are given enough medication to last until your next medication refill appointment. 5. Taking Medication: Take medication as prescribed. Never take more pills than instructed. Never take medication more frequently than prescribed. Taking less pills or less frequently is permitted and encouraged, when it comes to controlled substances (written prescriptions).  6. Inform other Doctors: Always inform, all of your healthcare providers, of all the medications you take. 7. Pain Medication from other Providers: You are not allowed to accept any additional pain medication from any other Doctor or Healthcare provider. There are two exceptions to this rule. (see below) In the event that you require additional pain medication, you are responsible for notifying us, as stated below. 8. Medication Agreement: You are responsible for carefully reading and following our Medication Agreement. This must be signed before receiving any prescriptions from our practice. Safely store a copy of your signed Agreement. Violations to the Agreement will result in no further prescriptions. (Additional copies of our Medication Agreement are  available upon request.) 9. Laws, Rules, & Regulations: All patients are expected to follow all Federal and [employer], [employer], Rules, [employer]. Ignorance of the Laws does not constitute a valid excuse. The use of any illegal substances is prohibited. 10. Adopted CDC guidelines & recommendations: Target dosing levels will  be at or below 60 MME/day. Use of benzodiazepines** is not recommended.  Exceptions: There are only two exceptions to the rule of not receiving pain medications from other Healthcare Providers. 1. Exception #1 (Emergencies): In the event of an emergency (i.e.: accident requiring emergency care), you are allowed to receive additional pain medication. However, you are responsible for: As soon as you are able, call our office [phone], at any time of the day or night, and leave a message stating your name, the date and nature of the emergency, and the name and dose of the medication prescribed. In the event that your call is answered by a member of our staff, make sure to document and save the date, time, and the name of the person that took your information.  2. Exception #2 (Planned Surgery): In the event that you are scheduled by another doctor or dentist to have any type of surgery or procedure, you are allowed (for a period no longer than 30 days), to receive additional pain medication, for the acute post-op pain. However, in this case, you are responsible for picking up a copy of our "Post-op Pain Management for Surgeons" handout, and giving it to your surgeon or dentist. This document is available at our office, and does not require an appointment to obtain it. Simply go to our office during business hours (Monday-Thursday from 8:00 AM to 4:00 PM) (Friday 8:00 AM to 12:00 Noon) or if you have a scheduled appointment with [place], prior to your surgery, and ask for it by name. In addition, you will need to provide [place] with your name, name of your surgeon, type of surgery, and date  of procedure or surgery.  *Opioid medications include: morphine, codeine, oxycodone, oxymorphone, hydrocodone, hydromorphone, meperidine, tramadol, tapentadol, buprenorphine, fentanyl, methadone. **Benzodiazepine medications include: diazepam (Valium), alprazolam (Xanax), clonazepam (Klonopine), lorazepam (Ativan), clorazepate (Tranxene), chlordiazepoxide (Librium), estazolam (Prosom), oxazepam (Serax), temazepam (Restoril), triazolam (Halcion)  ____________________________________________________________________________________________ Pain Management Discharge Instructions  General Discharge Instructions :  If you need to reach your doctor call: Monday-Friday 8:00 am - 4:00 pm at [phone] or toll free [phone].  After clinic hours [phone] to have operator reach doctor.  Bring all of your medication bottles to all your appointments in the pain clinic.  To cancel or reschedule your appointment with Pain Management please remember to call 24 hours in advance to avoid a fee.  Refer to the educational materials which you have been given on: General Risks, I had my Procedure. Discharge Instructions, Post Sedation.  Post Procedure Instructions:  The drugs you were given will stay in your system until tomorrow, so for the next 24 hours you should not drive, make any legal decisions or drink any alcoholic beverages.  You may eat anything you prefer, but it is better to start with liquids then soups and crackers, and gradually work up to solid foods.  Please notify your doctor immediately if you have any unusual bleeding, trouble breathing or pain that is not related to your normal pain.  Depending on the type of procedure that was done, some parts of your body may feel week and/or numb.  This usually clears up by tonight or the next day.  Walk with the use of an assistive device or accompanied by an adult for the 24 hours.  You may use ice on the affected area for the first 24  hours.  Put ice in a Ziploc bag and cover with a towel and place against area 15 minutes on 15  minutes off.  You may switch to heat after 24 hours.

## 2016-09-08 NOTE — Patient Instructions (Addendum)
____________________________________________________________________________________________  Medication Rules  Applies to: All patients receiving prescriptions (written or electronic).  Pharmacy of record: Pharmacy where electronic prescriptions will be sent. If written prescriptions are taken to a different pharmacy, please inform the nursing staff. The pharmacy listed in the electronic medical record should be the one where you would like electronic prescriptions to be sent.  Prescription refills: Only during scheduled appointments. Applies to both, written and electronic prescriptions.  NOTE: The following applies primarily to controlled substances (Opioid* Pain Medications).   Patient's responsibilities: 1. Pain Pills: Bring all pain pills to every appointment (except for procedure appointments). 2. Pill Bottles: Bring pills in original pharmacy bottle. Always bring newest bottle. Bring bottle, even if empty. 3. Medication refills: You are responsible for knowing and keeping track of what medications you need refilled. The day before your appointment, write a list of all prescriptions that need to be refilled. Bring that list to your appointment and give it to the admitting nurse. Prescriptions will be written only during appointments. If you forget a medication, it will not be "Called in", "Faxed", or "electronically sent". You will need to get another appointment to get these prescribed. 4. Prescription Accuracy: You are responsible for carefully inspecting your prescriptions before leaving our office. Have the discharge nurse carefully go over each prescription with you, before taking them home. Make sure that your name is accurately spelled, that your address is correct. Check the name and dose of your medication to make sure it is accurate. Check the number of pills, and the written instructions to make sure they are clear and accurate. Make sure that you are given enough medication to  last until your next medication refill appointment. 5. Taking Medication: Take medication as prescribed. Never take more pills than instructed. Never take medication more frequently than prescribed. Taking less pills or less frequently is permitted and encouraged, when it comes to controlled substances (written prescriptions).  6. Inform other Doctors: Always inform, all of your healthcare providers, of all the medications you take. 7. Pain Medication from other Providers: You are not allowed to accept any additional pain medication from any other Doctor or Healthcare provider. There are two exceptions to this rule. (see below) In the event that you require additional pain medication, you are responsible for notifying us, as stated below. 8. Medication Agreement: You are responsible for carefully reading and following our Medication Agreement. This must be signed before receiving any prescriptions from our practice. Safely store a copy of your signed Agreement. Violations to the Agreement will result in no further prescriptions. (Additional copies of our Medication Agreement are available upon request.) 9. Laws, Rules, & Regulations: All patients are expected to follow all Federal and State Laws, Statutes, Rules, & Regulations. Ignorance of the Laws does not constitute a valid excuse. The use of any illegal substances is prohibited. 10. Adopted CDC guidelines & recommendations: Target dosing levels will be at or below 60 MME/day. Use of benzodiazepines** is not recommended.  Exceptions: There are only two exceptions to the rule of not receiving pain medications from other Healthcare Providers. 1. Exception #1 (Emergencies): In the event of an emergency (i.e.: accident requiring emergency care), you are allowed to receive additional pain medication. However, you are responsible for: As soon as you are able, call our office [phone], at any time of the day or night, and leave a message stating your  name, the date and nature of the emergency, and the name and dose of the medication   prescribed. In the event that your call is answered by a member of our staff, make sure to document and save the date, time, and the name of the person that took your information.  2. Exception #2 (Planned Surgery): In the event that you are scheduled by another doctor or dentist to have any type of surgery or procedure, you are allowed (for a period no longer than 30 days), to receive additional pain medication, for the acute post-op pain. However, in this case, you are responsible for picking up a copy of our "Post-op Pain Management for Surgeons" handout, and giving it to your surgeon or dentist. This document is available at our office, and does not require an appointment to obtain it. Simply go to our office during business hours (Monday-Thursday from 8:00 AM to 4:00 PM) (Friday 8:00 AM to 12:00 Noon) or if you have a scheduled appointment with us, prior to your surgery, and ask for it by name. In addition, you will need to provide us with your name, name of your surgeon, type of surgery, and date of procedure or surgery.  *Opioid medications include: morphine, codeine, oxycodone, oxymorphone, hydrocodone, hydromorphone, meperidine, tramadol, tapentadol, buprenorphine, fentanyl, methadone. **Benzodiazepine medications include: diazepam (Valium), alprazolam (Xanax), clonazepam (Klonopine), lorazepam (Ativan), clorazepate (Tranxene), chlordiazepoxide (Librium), estazolam (Prosom), oxazepam (Serax), temazepam (Restoril), triazolam (Halcion)  ____________________________________________________________________________________________ Pain Management Discharge Instructions  General Discharge Instructions :  If you need to reach your doctor call: Monday-Friday 8:00 am - 4:00 pm at [phone] or toll free [phone].  After clinic hours [phone] to have operator reach doctor.  Bring all of your medication bottles  to all your appointments in the pain clinic.  To cancel or reschedule your appointment with Pain Management please remember to call 24 hours in advance to avoid a fee.  Refer to the educational materials which you have been given on: General Risks, I had my Procedure. Discharge Instructions, Post Sedation.  Post Procedure Instructions:  The drugs you were given will stay in your system until tomorrow, so for the next 24 hours you should not drive, make any legal decisions or drink any alcoholic beverages.  You may eat anything you prefer, but it is better to start with liquids then soups and crackers, and gradually work up to solid foods.  Please notify your doctor immediately if you have any unusual bleeding, trouble breathing or pain that is not related to your normal pain.  Depending on the type of procedure that was done, some parts of your body may feel week and/or numb.  This usually clears up by tonight or the next day.  Walk with the use of an assistive device or accompanied by an adult for the 24 hours.  You may use ice on the affected area for the first 24 hours.  Put ice in a Ziploc bag and cover with a towel and place against area 15 minutes on 15 minutes off.  You may switch to heat after 24 hours. 

## 2016-09-08 NOTE — Progress Notes (Signed)
Nursing Pain Medication Assessment:  Safety precautions to be maintained throughout the outpatient stay will include: orient to surroundings, keep bed in low position, maintain call bell within reach at all times, provide assistance with transfer out of bed and ambulation.  Medication Inspection Compliance: Pill count conducted under aseptic conditions, in front of the patient. Neither the pills nor the bottle was removed from the patient's sight at any time. Once count was completed pills were immediately returned to the patient in their original bottle.  Medication: Hydrocodone/APAP Pill/Patch Count: 1 of 120 pills remain Pill/Patch Appearance: Markings consistent with prescribed medication Bottle Appearance: Standard pharmacy container. Clearly labeled. Filled Date: 07 /21 / 2018 Last Medication intake:  Today

## 2016-09-09 LAB — COMPREHENSIVE METABOLIC PANEL
A/G RATIO: 1.6 (ref 1.2–2.2)
ALBUMIN: 4.2 g/dL (ref 3.5–5.5)
ALK PHOS: 73 IU/L (ref 39–117)
ALT: 34 IU/L — ABNORMAL HIGH (ref 0–32)
AST: 31 IU/L (ref 0–40)
BILIRUBIN TOTAL: 0.2 mg/dL (ref 0.0–1.2)
BUN / CREAT RATIO: 20 (ref 9–23)
BUN: 16 mg/dL (ref 6–24)
CHLORIDE: 97 mmol/L (ref 96–106)
CO2: 21 mmol/L (ref 20–29)
Calcium: 9.7 mg/dL (ref 8.7–10.2)
Creatinine, Ser: 0.8 mg/dL (ref 0.57–1.00)
GFR calc Af Amer: 104 mL/min/{1.73_m2} (ref >59)
GFR calc non Af Amer: 90 mL/min/{1.73_m2} (ref >59)
GLOBULIN, TOTAL: 2.7 g/dL (ref 1.5–4.5)
Glucose: 176 mg/dL — ABNORMAL HIGH (ref 65–99)
POTASSIUM: 4.6 mmol/L (ref 3.5–5.2)
SODIUM: 135 mmol/L (ref 134–144)
Total Protein: 6.9 g/dL (ref 6.0–8.5)

## 2016-09-17 ENCOUNTER — Ambulatory Visit: Payer: BLUE CROSS/BLUE SHIELD

## 2016-09-29 ENCOUNTER — Ambulatory Visit
Admission: RE | Admit: 2016-09-29 | Discharge: 2016-09-29 | Disposition: A | Discharge status: Home / Self Care | Payer: BLUE CROSS/BLUE SHIELD | Source: Ambulatory Visit | Attending: Nurse Practitioner | Admitting: Nurse Practitioner

## 2016-09-29 DIAGNOSIS — M1288 Other specific arthropathies, not elsewhere classified, other specified site: Secondary | ICD-10-CM | POA: Diagnosis not present

## 2016-09-29 DIAGNOSIS — M5127 Other intervertebral disc displacement, lumbosacral region: Secondary | ICD-10-CM | POA: Diagnosis not present

## 2016-09-29 DIAGNOSIS — M5416 Radiculopathy, lumbar region: Secondary | ICD-10-CM | POA: Diagnosis present

## 2016-09-30 NOTE — Progress Notes (Signed)
Results were reviewed and found to be: abnormal  No acute injury or pathology identified  Review would suggest interventional pain management techniques may be of benefit

## 2016-10-21 ENCOUNTER — Encounter: Payer: Self-pay | Admitting: Nurse Practitioner

## 2016-10-21 ENCOUNTER — Ambulatory Visit: Payer: BLUE CROSS/BLUE SHIELD | Attending: Nurse Practitioner | Admitting: Nurse Practitioner

## 2016-10-21 VITALS — BP 159/91 | HR 89 | Temp 98.0°F | Resp 16 | Ht 64.0 in | Wt 179.0 lb

## 2016-10-21 DIAGNOSIS — M25559 Pain in unspecified hip: Secondary | ICD-10-CM | POA: Diagnosis not present

## 2016-10-21 DIAGNOSIS — E7800 Pure hypercholesterolemia, unspecified: Secondary | ICD-10-CM | POA: Diagnosis not present

## 2016-10-21 DIAGNOSIS — F419 Anxiety disorder, unspecified: Secondary | ICD-10-CM | POA: Diagnosis not present

## 2016-10-21 DIAGNOSIS — E559 Vitamin D deficiency, unspecified: Secondary | ICD-10-CM | POA: Diagnosis not present

## 2016-10-21 DIAGNOSIS — M792 Neuralgia and neuritis, unspecified: Secondary | ICD-10-CM

## 2016-10-21 DIAGNOSIS — F329 Major depressive disorder, single episode, unspecified: Secondary | ICD-10-CM | POA: Diagnosis not present

## 2016-10-21 DIAGNOSIS — M4696 Unspecified inflammatory spondylopathy, lumbar region: Secondary | ICD-10-CM | POA: Diagnosis not present

## 2016-10-21 DIAGNOSIS — Z79891 Long term (current) use of opiate analgesic: Secondary | ICD-10-CM | POA: Diagnosis not present

## 2016-10-21 DIAGNOSIS — I10 Essential (primary) hypertension: Secondary | ICD-10-CM | POA: Insufficient documentation

## 2016-10-21 DIAGNOSIS — E785 Hyperlipidemia, unspecified: Secondary | ICD-10-CM | POA: Insufficient documentation

## 2016-10-21 DIAGNOSIS — E119 Type 2 diabetes mellitus without complications: Secondary | ICD-10-CM | POA: Diagnosis not present

## 2016-10-21 DIAGNOSIS — M545 Low back pain, unspecified: Secondary | ICD-10-CM

## 2016-10-21 DIAGNOSIS — M25552 Pain in left hip: Secondary | ICD-10-CM | POA: Insufficient documentation

## 2016-10-21 DIAGNOSIS — M47816 Spondylosis without myelopathy or radiculopathy, lumbar region: Secondary | ICD-10-CM | POA: Diagnosis not present

## 2016-10-21 DIAGNOSIS — M47814 Spondylosis without myelopathy or radiculopathy, thoracic region: Secondary | ICD-10-CM | POA: Insufficient documentation

## 2016-10-21 DIAGNOSIS — Z87891 Personal history of nicotine dependence: Secondary | ICD-10-CM | POA: Diagnosis not present

## 2016-10-21 DIAGNOSIS — G8929 Other chronic pain: Secondary | ICD-10-CM

## 2016-10-21 DIAGNOSIS — K760 Fatty (change of) liver, not elsewhere classified: Secondary | ICD-10-CM | POA: Insufficient documentation

## 2016-10-21 DIAGNOSIS — Z7982 Long term (current) use of aspirin: Secondary | ICD-10-CM | POA: Insufficient documentation

## 2016-10-21 DIAGNOSIS — M5117 Intervertebral disc disorders with radiculopathy, lumbosacral region: Secondary | ICD-10-CM | POA: Diagnosis not present

## 2016-10-21 DIAGNOSIS — K219 Gastro-esophageal reflux disease without esophagitis: Secondary | ICD-10-CM | POA: Diagnosis not present

## 2016-10-21 DIAGNOSIS — E669 Obesity, unspecified: Secondary | ICD-10-CM | POA: Diagnosis not present

## 2016-10-21 DIAGNOSIS — G43909 Migraine, unspecified, not intractable, without status migrainosus: Secondary | ICD-10-CM | POA: Insufficient documentation

## 2016-10-21 DIAGNOSIS — Z7984 Long term (current) use of oral hypoglycemic drugs: Secondary | ICD-10-CM | POA: Insufficient documentation

## 2016-10-21 DIAGNOSIS — K589 Irritable bowel syndrome without diarrhea: Secondary | ICD-10-CM | POA: Insufficient documentation

## 2016-10-21 DIAGNOSIS — M25551 Pain in right hip: Secondary | ICD-10-CM | POA: Diagnosis not present

## 2016-10-21 DIAGNOSIS — Z6830 Body mass index (BMI) 30.0-30.9, adult: Secondary | ICD-10-CM | POA: Diagnosis not present

## 2016-10-21 DIAGNOSIS — G894 Chronic pain syndrome: Secondary | ICD-10-CM | POA: Diagnosis not present

## 2016-10-21 MED ORDER — OXYCODONE HCL 5 MG PO TABS: 5.0000 mg | ORAL_TABLET | Freq: Four times a day (QID) | ORAL | 0 refills | Status: AC | PRN

## 2016-10-21 NOTE — Progress Notes (Signed)
Nursing Pain Medication Assessment:  Safety precautions to be maintained throughout the outpatient stay will include: orient to surroundings, keep bed in low position, maintain call bell within reach at all times, provide assistance with transfer out of bed and ambulation.  Medication Inspection Compliance: Pill count conducted under aseptic conditions, in front of the patient. Neither the pills nor the bottle was removed from the patient's sight at any time. Once count was completed pills were immediately returned to the patient in their original bottle.  Medication: See above Pill/Patch Count: 0 of 120 pills remain Pill/Patch Appearance: Markings consistent with prescribed medication Bottle Appearance: Standard pharmacy container. Clearly labeled. Filled Date: 08/20 / 2018 Last Medication intake:  Today

## 2016-10-21 NOTE — Progress Notes (Signed)
Patient's Name: Kelly Rollins  MRN: [id number]  Referring Provider: Leonel Ramsay, MD  DOB: August 24, 1970  PCP: Leonel Ramsay, MD  DOS: 10/21/2016  Note by: Vevelyn Francois NP  Service setting: Ambulatory outpatient  Specialty: Interventional Pain Management  Location: [facility] (AMB) Pain Management Facility    Patient type: Established    Primary Reason(s) for Visit: Encounter for prescription drug management. (Level of risk: moderate)  CC: Back Pain (lower)  HPI  Kelly Rollins is a 46 y.o. year old, female patient, who comes today for a medication management evaluation. She has TOBACCO ABUSE; GERD; Constipation; Pure hypercholesterolemia; Hyperlipidemia; Fatty liver disease; Anxiety; Obesity; Essential hypertriglyceridemia; Long term current use of opiate analgesic; Long term prescription opiate use; Opiate use (25 MME/Day); Encounter for therapeutic drug level monitoring; Encounter for pain management planning; Chronic low back pain (Location of Primary Source of Pain) (Bilateral) (L>R); Lumbar facet syndrome (Location of Primary Source of Pain) (Bilateral) (L>R); Chronic hip pain (Location of Secondary source of pain) (Bilateral) (L>R); Chronic knee pain (Location of Tertiary source of pain) (Bilateral) (L>R); Lumbar spondylosis; Neuropathic pain; Neurogenic pain; Myofascial pain; Acute low back pain; Lumbar transverse process fracture (HCC) (Left L1) (03/11/15); Chronic pain syndrome; Depression; Diabetes mellitus type 2, uncomplicated ([place]); Fatty liver; HTN (hypertension), benign; Vitamin D insufficiency; Acute postoperative pain; and Lumbar radiculopathy on her problem list. Her primarily concern today is the Back Pain (lower)  Pain Assessment: Location: Lower Back Radiating: buttocks bilateral down back of legs to thighs Onset: More than a month ago Duration: Chronic pain Quality: Aching, Constant, Sharp, Radiating, Penetrating, Discomfort Severity: 4 /10 (self-reported pain score)   Note: Reported level is compatible with observation.                   Effect on ADL: Pace self, bending, housework, up and down stairs Timing: Constant Modifying factors: medications, standing, streches  Kelly Rollins was last scheduled for an appointment on 09/08/2016 for medication management. During today's appointment we reviewed Kelly Rollins's chronic pain status, as well as her outpatient medication regimen. She states that she has firworks going off in her back. She states that she is not sure if all this is stemmed from the procedure; RFA. She has had some changes in her health; Diabetes and HTN diagnoses. She is tolerating her medications. She was switched to the [place]. She states that she was using 5 per day. She did not realize that the Rx was for QID. She does use Aleve. She is requesting something for sleep.    The patient  reports that she does not use drugs. Her body mass index is 30.73 kg/m.  Further details on both, my assessment(s), as well as the proposed treatment plan, please see below.  Controlled Substance Pharmacotherapy Assessment REMS (Risk Evaluation and Mitigation Strategy)  Analgesic:Oxycodone IRone tablet 5 times a day (25 mg/day) MME/day:37.37m/day  .  GIgnatius Specking RN  10/21/2016  8:37 AM  Sign at close encounter Nursing Pain Medication Assessment:  Safety precautions to be maintained throughout the outpatient stay will include: orient to surroundings, keep bed in low position, maintain call bell within reach at all times, provide assistance with transfer out of bed and ambulation.  Medication Inspection Compliance: Pill count conducted under aseptic conditions, in front of the patient. Neither the pills nor the bottle was removed from the patient's sight at any time. Once count was completed pills were immediately returned to the patient in their original bottle.  Medication:  See above Pill/Patch Count: 0 of 120 pills remain Pill/Patch Appearance:  Markings consistent with prescribed medication Bottle Appearance: Standard pharmacy container. Clearly labeled. Filled Date: 08/20 / 2018 Last Medication intake:  Today   Pharmacokinetics: Liberation and absorption (onset of action): WNL Distribution (time to peak effect): WNL Metabolism and excretion (duration of action): WNL         Pharmacodynamics: Desired effects: Analgesia: Kelly Rollins reports >50% benefit. Functional ability: Patient reports that medication allows her to accomplish basic ADLs Clinically meaningful improvement in function (CMIF): Sustained CMIF goals met Perceived effectiveness: Described as relatively effective, allowing for increase in activities of daily living (ADL) Undesirable effects: Side-effects or Adverse reactions: None reported Monitoring: [place] PMP: Online review of the past 61-monthperiod conducted. Compliant with practice rules and regulations List of all UDS test(s) done:  Lab Results  Component Value Date   TOXASSSELUR FINAL 05/29/2015   TBellaireFINAL 05/01/2015   SUMMARY FINAL 04/28/2016   Last UDS on record: ToxAssure Select 13  Date Value Ref Range Status  05/29/2015 FINAL  Final    Comment:    ==================================================================== TOXASSURE SELECT 13 (MW) ==================================================================== Test                             Result       Flag       Units Drug Present and Declared for Prescription Verification   Hydrocodone                    3507         EXPECTED   ng/mg creat   Hydromorphone                  1175         EXPECTED   ng/mg creat   Dihydrocodeine                 438          EXPECTED   ng/mg creat   Norhydrocodone                 2664         EXPECTED   ng/mg creat    Sources of hydrocodone include scheduled prescription    medications. Hydromorphone, dihydrocodeine and norhydrocodone are    expected metabolites of hydrocodone. Hydromorphone and     dihydrocodeine are also available as scheduled prescription    medications. ==================================================================== Test                      Result    Flag   Units      Ref Range   Creatinine              55               mg/dL      >=20 ==================================================================== Declared Medications:  The flagging and interpretation on this report are based on the  following declared medications.  Unexpected results may arise from  inaccuracies in the declared medications.  **Note: The testing scope of this panel includes these medications:  Hydrocodone (Hydrocodone-Acetaminophen)  **Note: The testing scope of this panel does not include following  reported medications:  Acetaminophen (Hydrocodone-Acetaminophen)  Gabapentin (Neurontin)  Meloxicam (Mobic)  Methocarbamol (Robaxin)  Multivitamin (MVI)  Vitamin C ==================================================================== For clinical consultation, please call [phone] ====================================================================    Summary  Date Value Ref Range Status  04/28/2016 FINAL  Final    Comment:    ==================================================================== TOXASSURE COMP DRUG ANALYSIS,UR ==================================================================== Test                             Result       Flag       Units Drug Present not Declared for Prescription Verification   Trazodone                      PRESENT      UNEXPECTED   1,3 chlorophenyl piperazine    PRESENT      UNEXPECTED    1,3-chlorophenyl piperazine is an expected metabolite of    trazodone.   Acetaminophen                  PRESENT      UNEXPECTED   Clonidine                      PRESENT      UNEXPECTED Drug Absent but Declared for Prescription Verification   Oxycodone                      Not Detected UNEXPECTED ng/mg creat   Naproxen                       Not  Detected UNEXPECTED ==================================================================== Test                      Result    Flag   Units      Ref Range   Creatinine              165              mg/dL      >=20 ==================================================================== Declared Medications:  The flagging and interpretation on this report are based on the  following declared medications.  Unexpected results may arise from  inaccuracies in the declared medications.  **Note: The testing scope of this panel includes these medications:  Naproxen  Oxycodone  **Note: The testing scope of this panel does not include following  reported medications:  Albuterol  Cholecalciferol  Magnesium (Mag)  Multivitamin (MVI)  Vitamin C ==================================================================== For clinical consultation, please call [phone]. ====================================================================    UDS interpretation: Compliant          Medication Assessment Form: Reviewed. Patient indicates being compliant with therapy Treatment compliance: Compliant Risk Assessment Profile: Aberrant behavior: See prior evaluations. None observed or detected today Comorbid factors increasing risk of overdose: See prior notes. No additional risks detected today Risk of substance use disorder (SUD): Low     Opioid Risk Tool - 10/21/16 0833      Family History of Substance Abuse   Alcohol Negative   Illegal Drugs Negative   Rx Drugs Negative     Personal History of Substance Abuse   Alcohol Negative   Illegal Drugs Negative   Rx Drugs Negative     Age   Age between 74-45 years  Yes     History of Preadolescent Sexual Abuse   History of Preadolescent Sexual Abuse Negative or Female     Psychological Disease   Psychological Disease Negative   Depression Negative     Total Score   Opioid Risk Tool Scoring 1   Opioid Risk Interpretation Low Risk     ORT  Scoring  interpretation table:  Score <3 = Low Risk for SUD  Score between 4-7 = Moderate Risk for SUD  Score >8 = High Risk for Opioid Abuse   Risk Mitigation Strategies:  Patient Counseling: Covered Patient-Prescriber Agreement (PPA): Present and active  Notification to other healthcare providers: Done  Pharmacologic Plan: No change in therapy, at this time  Laboratory Chemistry  Inflammation Markers (CRP: Acute Phase) (ESR: Chronic Phase) Lab Results  Component Value Date   CRP 2.5 (H) 04/28/2016   ESRSEDRATE 40 (H) 04/28/2016                 Renal Function Markers Lab Results  Component Value Date   BUN 16 09/08/2016   CREATININE 0.80 09/08/2016   GFRAA 104 09/08/2016   GFRNONAA 90 09/08/2016                 Hepatic Function Markers Lab Results  Component Value Date   AST 31 09/08/2016   ALT 34 (H) 09/08/2016   ALBUMIN 4.2 09/08/2016   ALKPHOS 73 09/08/2016                 Electrolytes Lab Results  Component Value Date   NA 135 09/08/2016   K 4.6 09/08/2016   CL 97 09/08/2016   CALCIUM 9.7 09/08/2016   MG 2.1 04/28/2016                 Neuropathy Markers Lab Results  Component Value Date   VITAMINB12 638 04/28/2016                 Bone Pathology Markers Lab Results  Component Value Date   ALKPHOS 73 09/08/2016   25OHVITD1 27 (L) 04/28/2016   25OHVITD2 3.7 04/28/2016   25OHVITD3 23 04/28/2016   CALCIUM 9.7 09/08/2016                 Coagulation Parameters Lab Results  Component Value Date   PLT 278 08/24/2016                 Cardiovascular Markers Lab Results  Component Value Date   HGB 13.9 08/24/2016   HCT 39.1 08/24/2016                 Note: Lab results reviewed.  Recent Diagnostic Imaging Review  Mr Lumbar Spine Wo Contrast  Result Date: 09/29/2016 CLINICAL DATA:  [address] down stairs February 2017. LBP with bilateral leg pain. Right leg worse. Pain upper leg mainly. Leg weakness worse after sitting or standing. Problems worse since fall  2017. EXAM: MRI LUMBAR SPINE WITHOUT CONTRAST TECHNIQUE: Multiplanar, multisequence MR imaging of the lumbar spine was performed. No intravenous contrast was administered. COMPARISON:  None. FINDINGS: Segmentation:  Standard. Alignment:  Physiologic. Vertebrae:  No fracture, evidence of discitis, or bone lesion. Conus medullaris: Extends to the T12 level and appears normal. Paraspinal and other soft tissues: Negative. Disc levels: Disc spaces: Disc desiccation at L5-S1. T12-L1: No significant disc bulge. No evidence of neural foraminal stenosis. No central canal stenosis. L1-L2: No significant disc bulge. No evidence of neural foraminal stenosis. No central canal stenosis. L2-L3: No significant disc bulge. No evidence of neural foraminal stenosis. No central canal stenosis. L3-L4: No significant disc bulge. No evidence of neural foraminal stenosis. No central canal stenosis. L4-L5: No significant disc bulge. No evidence of neural foraminal stenosis. No central canal stenosis. Mild bilateral facet arthropathy. L5-S1: Broad-based disc bulge with a small central disc protrusion. Mild bilateral facet  arthropathy. No evidence of neural foraminal stenosis. No central canal stenosis. IMPRESSION: 1. At L5-S1 there is a broad-based disc bulge with a small central disc protrusion. Mild bilateral facet arthropathy. 2. At L4-5 there is mild bilateral facet arthropathy. Electronically Signed   By: Kathreen Devoid   On: 09/29/2016 13:54   Note: Imaging results reviewed.          Meds   Current Outpatient Prescriptions:  .  Ascorbic Acid (VITAMIN C) 100 MG tablet, Take 100 mg by mouth daily., Disp: , Rfl:  .  Aspirin-Acetaminophen-Caffeine (EXCEDRIN EXTRA STRENGTH PO), Take 2 tablets by mouth as needed., Disp: , Rfl:  .  Cholecalciferol (VITAMIN D3) 1000 units CAPS, Take by mouth daily., Disp: , Rfl:  .  gabapentin (NEURONTIN) 300 MG capsule, Take 1 capsule (300 mg total) by mouth every 8 (eight) hours., Disp: 90 capsule,  Rfl: 0 .  ibuprofen (ADVIL,MOTRIN) 200 MG tablet, Take by mouth as needed. , Disp: , Rfl:  .  lisinopril (PRINIVIL,ZESTRIL) 5 MG tablet, Take by mouth., Disp: , Rfl:  .  Magnesium 250 MG TABS, Take by mouth at bedtime., Disp: , Rfl:  .  metFORMIN (GLUCOPHAGE) 500 MG tablet, Take by mouth., Disp: , Rfl:  .  metoprolol succinate (TOPROL-XL) 50 MG 24 hr tablet, 50 mg daily. , Disp: , Rfl: 2 .  Multiple Vitamin (MULTIVITAMIN) tablet, Take 1 tablet by mouth daily., Disp: , Rfl:  .  oxyCODONE (OXY IR/ROXICODONE) 5 MG immediate release tablet, , Disp: , Rfl: 0 .  oxyCODONE (OXY IR/ROXICODONE) 5 MG immediate release tablet, Take 1 tablet (5 mg total) by mouth every 6 (six) hours as needed for severe pain., Disp: 120 tablet, Rfl: 0 .  PROAIR HFA 108 (90 Base) MCG/ACT inhaler, , Disp: , Rfl: 4 .  albuterol (PROAIR HFA) 108 (90 Base) MCG/ACT inhaler, Inhale 2 puffs into the lungs every 6 (six) hours as needed. , Disp: , Rfl:  .  [START ON 11/20/2016] oxyCODONE (OXY IR/ROXICODONE) 5 MG immediate release tablet, Take 1 tablet (5 mg total) by mouth every 6 (six) hours as needed for severe pain., Disp: 120 tablet, Rfl: 0 .  [START ON 12/20/2016] oxyCODONE (OXY IR/ROXICODONE) 5 MG immediate release tablet, Take 1 tablet (5 mg total) by mouth every 6 (six) hours as needed for severe pain., Disp: 120 tablet, Rfl: 0  ROS  Constitutional: Denies any fever or chills Gastrointestinal: No reported hemesis, hematochezia, vomiting, or acute GI distress Musculoskeletal: Denies any acute onset joint swelling, redness, loss of ROM, or weakness Neurological: No reported episodes of acute onset apraxia, aphasia, dysarthria, agnosia, amnesia, paralysis, loss of coordination, or loss of consciousness  Allergies  Ms. Glandon is allergic to morphine; penicillins; and tramadol hcl.  [place]  Drug: Ms. Tomer  reports that she does not use drugs. Alcohol:  reports that she drinks alcohol. Tobacco:  reports that she quit smoking  about 2 years ago. Her smoking use included Cigarettes. She has never used smokeless tobacco. Medical:  has a past medical history of ABDOMINAL PAIN OTHER SPECIFIED SITE (02/21/2009); ABDOMINAL PAIN-LUQ (04/17/2009); Anal fissure; Diabetes mellitus without complication ([place]); Headache(784.0); HEMATOCHEZIA (04/17/2009); Hypertension; IBS (irritable bowel syndrome); Migraine headache (11/06/2008); OVARIAN CYST, RIGHT (02/21/2009); and PALPITATIONS, OCCASIONAL (04/17/2010). Surgical: Ms. Ricklefs  has a past surgical history that includes Cesarean section; Vaginal hysterectomy; Cholecystectomy; and Breast reduction surgery. Family: family history includes Cancer in her mother; Heart disease in her father; Hyperlipidemia in her father and mother; Hypertension in her father  and mother.  Constitutional Exam  General appearance: Well nourished, well developed, and well hydrated. In no apparent acute distress Vitals:   10/21/16 0825  BP: (!) 159/91  Pulse: 89  Resp: 16  Temp: 98 F (36.7 C)  SpO2: 100%  Weight: 179 lb (81.2 kg)  Height: _0  (1.626 m)   BMI Assessment: Estimated body mass index is 30.73 kg/m as calculated from the following:   Height as of this encounter: _1  (1.626 m).   Weight as of this encounter: 179 lb (81.2 kg). Psych/Mental status: Alert, oriented x 3 (person, place, & time)       Eyes: PERLA Respiratory: No evidence of acute respiratory distress  Lumbar Spine Area Exam  Skin & Axial Inspection: No masses, redness, or swelling Alignment: Symmetrical Functional ROM: Unrestricted ROM      Stability: No instability detected Muscle Tone/Strength: Functionally intact. No obvious neuro-muscular anomalies detected. Sensory (Neurological): Unimpaired Palpation: Complains of area being tender to palpation       Provocative Tests: Lumbar Hyperextension and rotation test: evaluation deferred today       Lumbar Lateral bending test: evaluation deferred today       Patrick's  Maneuver: evaluation deferred today                    Gait & Posture Assessment  Ambulation: Unassisted Gait: Relatively normal for age and body habitus Posture: WNL   Lower Extremity Exam    Side: Right lower extremity  Side: Left lower extremity  Skin & Extremity Inspection: Skin color, temperature, and hair growth are WNL. No peripheral edema or cyanosis. No masses, redness, swelling, asymmetry, or associated skin lesions. No contractures.  Skin & Extremity Inspection: Skin color, temperature, and hair growth are WNL. No peripheral edema or cyanosis. No masses, redness, swelling, asymmetry, or associated skin lesions. No contractures.  Functional ROM: Unrestricted ROM          Functional ROM: Unrestricted ROM          Muscle Tone/Strength: Functionally intact. No obvious neuro-muscular anomalies detected.  Muscle Tone/Strength: Functionally intact. No obvious neuro-muscular anomalies detected.  Sensory (Neurological): Unimpaired  Sensory (Neurological): Unimpaired  Palpation: No palpable anomalies  Palpation: No palpable anomalies   Assessment  Primary Diagnosis & Pertinent Problem List: The primary encounter diagnosis was Lumbar spondylosis. Diagnoses of Lumbar facet syndrome (Location of Primary Source of Pain) (Bilateral) (L>R), Chronic low back pain (Location of Primary Source of Pain) (Bilateral) (L>R), Chronic hip pain, unspecified laterality, Neurogenic pain, Chronic pain syndrome, and Long term current use of opiate analgesic were also pertinent to this visit.  Status Diagnosis  Controlled Controlled Controlled 1. Lumbar spondylosis   2. Lumbar facet syndrome (Location of Primary Source of Pain) (Bilateral) (L>R)   3. Chronic low back pain (Location of Primary Source of Pain) (Bilateral) (L>R)   4. Chronic hip pain, unspecified laterality   5. Neurogenic pain   6. Chronic pain syndrome   7. Long term current use of opiate analgesic     Problems updated and reviewed during  this visit: No problems updated. Plan of Care  Pharmacotherapy (Medications Ordered): Meds ordered this encounter  Medications  . oxyCODONE (OXY IR/ROXICODONE) 5 MG immediate release tablet    Sig: Take 1 tablet (5 mg total) by mouth every 6 (six) hours as needed for severe pain.    Dispense:  120 tablet    Refill:  0    Do not place this medication, or  any other prescription from our practice, on "Automatic Refill". Patient may have prescription filled one day early if pharmacy is closed on scheduled refill date. Do not fill until: 11/20/2016 To last until:12/20/2016    Order Specific Question:   Supervising Provider    Answer:   Milinda Pointer [phone]  . oxyCODONE (OXY IR/ROXICODONE) 5 MG immediate release tablet    Sig: Take 1 tablet (5 mg total) by mouth every 6 (six) hours as needed for severe pain.    Dispense:  120 tablet    Refill:  0    Do not place this medication, or any other prescription from our practice, on "Automatic Refill". Patient may have prescription filled one day early if pharmacy is closed on scheduled refill date. Do not fill until: 12/20/2016 To last until:01/19/2017    Order Specific Question:   Supervising Provider    Answer:   Milinda Pointer [phone]  . oxyCODONE (OXY IR/ROXICODONE) 5 MG immediate release tablet    Sig: Take 1 tablet (5 mg total) by mouth every 6 (six) hours as needed for severe pain.    Dispense:  120 tablet    Refill:  0    Fill one day early if pharmacy is closed on scheduled refill date. Do not fill until:10/21/2016 To last until:11/20/2016    Order Specific Question:   Supervising Provider    Answer:   Milinda Pointer [phone]   New Prescriptions   OXYCODONE (OXY IR/ROXICODONE) 5 MG IMMEDIATE RELEASE TABLET    Take 1 tablet (5 mg total) by mouth every 6 (six) hours as needed for severe pain.   OXYCODONE (OXY IR/ROXICODONE) 5 MG IMMEDIATE RELEASE TABLET    Take 1 tablet (5 mg total) by mouth every 6 (six) hours as needed  for severe pain.   Medications administered today: Ms. Forsberg had no medications administered during this visit. Lab-work, procedure(s), and/or referral(s): Orders Placed This Encounter  Procedures  . ToxASSURE Select 13 (MW), Urine   Imaging and/or referral(s): None  Interventional therapies: Planned, scheduled, and/or pending:   Not at this time   Considering:   Diagnostic bilateral hip joint injection Palliative bilateral lumbar facet block  Bilateral lumbar facet radiofrequency ablation.    Palliative PRN treatment(s):   Diagnostic bilateral hip joint injection  Palliative Bilateral lumbar facet block plus left L1 transverse process injection   Provider-requested follow-up: Return in about 3 months (around 01/20/2017) for MedMgmt.  Future Appointments Date Time Provider [place]  01/11/2017 8:30 AM Vevelyn Francois, NP [facility] None   Primary Care Physician: Leonel Ramsay, MD Location: [facility] Note by: Vevelyn Francois NP Date: 10/21/2016; Time: 1:16 PM  Pain Score Disclaimer: We use the NRS-11 scale. This is a self-reported, subjective measurement of pain severity with only modest accuracy. It is used primarily to identify changes within a particular patient. It must be understood that outpatient pain scales are significantly less accurate that those used for research, where they can be applied under ideal controlled circumstances with minimal exposure to variables. In reality, the score is likely to be a combination of pain intensity and pain affect, where pain affect describes the degree of emotional arousal or changes in action readiness caused by the sensory experience of pain. Factors such as social and work situation, setting, emotional state, anxiety levels, expectation, and prior pain experience may influence pain perception and show large inter-individual differences that may also be affected by time  variables.  Patient instructions provided  during this appointment: Patient Instructions    ____________________________________________________________________________________________  Medication Rules  Applies to: All patients receiving prescriptions (written or electronic).  Pharmacy of record: Pharmacy where electronic prescriptions will be sent. If written prescriptions are taken to a different pharmacy, please inform the nursing staff. The pharmacy listed in the electronic medical record should be the one where you would like electronic prescriptions to be sent.  Prescription refills: Only during scheduled appointments. Applies to both, written and electronic prescriptions.  NOTE: The following applies primarily to controlled substances (Opioid* Pain Medications).   Patient's responsibilities: 1. Pain Pills: Bring all pain pills to every appointment (except for procedure appointments). 2. Pill Bottles: Bring pills in original pharmacy bottle. Always bring newest bottle. Bring bottle, even if empty. 3. Medication refills: You are responsible for knowing and keeping track of what medications you need refilled. The day before your appointment, write a list of all prescriptions that need to be refilled. Bring that list to your appointment and give it to the admitting nurse. Prescriptions will be written only during appointments. If you forget a medication, it will not be "Called in", "Faxed", or "electronically sent". You will need to get another appointment to get these prescribed. 4. Prescription Accuracy: You are responsible for carefully inspecting your prescriptions before leaving our office. Have the discharge nurse carefully go over each prescription with you, before taking them home. Make sure that your name is accurately spelled, that your address is correct. Check the name and dose of your medication to make sure it is accurate. Check the number of pills, and the written instructions  to make sure they are clear and accurate. Make sure that you are given enough medication to last until your next medication refill appointment. 5. Taking Medication: Take medication as prescribed. Never take more pills than instructed. Never take medication more frequently than prescribed. Taking less pills or less frequently is permitted and encouraged, when it comes to controlled substances (written prescriptions).  6. Inform other Doctors: Always inform, all of your healthcare providers, of all the medications you take. 7. Pain Medication from other Providers: You are not allowed to accept any additional pain medication from any other Doctor or Healthcare provider. There are two exceptions to this rule. (see below) In the event that you require additional pain medication, you are responsible for notifying us, as stated below. 8. Medication Agreement: You are responsible for carefully reading and following our Medication Agreement. This must be signed before receiving any prescriptions from our practice. Safely store a copy of your signed Agreement. Violations to the Agreement will result in no further prescriptions. (Additional copies of our Medication Agreement are available upon request.) 9. Laws, Rules, & Regulations: All patients are expected to follow all Federal and [employer], [employer], Rules, [employer]. Ignorance of the Laws does not constitute a valid excuse. The use of any illegal substances is prohibited. 10. Adopted CDC guidelines & recommendations: Target dosing levels will be at or below 60 MME/day. Use of benzodiazepines** is not recommended.  Exceptions: There are only two exceptions to the rule of not receiving pain medications from other Healthcare Providers. 1. Exception #1 (Emergencies): In the event of an emergency (i.e.: accident requiring emergency care), you are allowed to receive additional pain medication. However, you are responsible for: As soon as you are able, call our  office [phone], at any time of the day or night, and leave a message stating your name, the date and nature of the emergency,  and the name and dose of the medication prescribed. In the event that your call is answered by a member of our staff, make sure to document and save the date, time, and the name of the person that took your information.  2. Exception #2 (Planned Surgery): In the event that you are scheduled by another doctor or dentist to have any type of surgery or procedure, you are allowed (for a period no longer than 30 days), to receive additional pain medication, for the acute post-op pain. However, in this case, you are responsible for picking up a copy of our "Post-op Pain Management for Surgeons" handout, and giving it to your surgeon or dentist. This document is available at our office, and does not require an appointment to obtain it. Simply go to our office during business hours (Monday-Thursday from 8:00 AM to 4:00 PM) (Friday 8:00 AM to 12:00 Noon) or if you have a scheduled appointment with [place], prior to your surgery, and ask for it by name. In addition, you will need to provide [place] with your name, name of your surgeon, type of surgery, and date of procedure or surgery.  *Opioid medications include: morphine, codeine, oxycodone, oxymorphone, hydrocodone, hydromorphone, meperidine, tramadol, tapentadol, buprenorphine, fentanyl, methadone. **Benzodiazepine medications include: diazepam (Valium), alprazolam (Xanax), clonazepam (Klonopine), lorazepam (Ativan), clorazepate (Tranxene), chlordiazepoxide (Librium), estazolam (Prosom), oxazepam (Serax), temazepam (Restoril), triazolam (Halcion)  ____________________________________________________________________________________________  BMI Assessment: Estimated body mass index is 30.73 kg/m as calculated from the following:   Height as of this encounter: _0  (1.626 m).   Weight as of this encounter: 179 lb (81.2 kg).  BMI  interpretation table: BMI level Category Range association with higher incidence of chronic pain  <18 kg/m2 Underweight   18.5-24.9 kg/m2 Ideal body weight   25-29.9 kg/m2 Overweight Increased incidence by 20%  30-34.9 kg/m2 Obese (Class I) Increased incidence by 68%  35-39.9 kg/m2 Severe obesity (Class II) Increased incidence by 136%  >40 kg/m2 Extreme obesity (Class III) Increased incidence by 254%   BMI Readings from Last 4 Encounters:  10/21/16 30.73 kg/m  09/08/16 30.55 kg/m  08/24/16 31.76 kg/m  07/21/16 33.81 kg/m   Wt Readings from Last 4 Encounters:  10/21/16 179 lb (81.2 kg)  09/08/16 178 lb (80.7 kg)  08/24/16 185 lb (83.9 kg)  07/21/16 197 lb (89.4 kg)

## 2016-10-21 NOTE — Patient Instructions (Addendum)
____________________________________________________________________________________________  Medication Rules  Applies to: All patients receiving prescriptions (written or electronic).  Pharmacy of record: Pharmacy where electronic prescriptions will be sent. If written prescriptions are taken to a different pharmacy, please inform the nursing staff. The pharmacy listed in the electronic medical record should be the one where you would like electronic prescriptions to be sent.  Prescription refills: Only during scheduled appointments. Applies to both, written and electronic prescriptions.  NOTE: The following applies primarily to controlled substances (Opioid* Pain Medications).   Patient's responsibilities: 1. Pain Pills: Bring all pain pills to every appointment (except for procedure appointments). 2. Pill Bottles: Bring pills in original pharmacy bottle. Always bring newest bottle. Bring bottle, even if empty. 3. Medication refills: You are responsible for knowing and keeping track of what medications you need refilled. The day before your appointment, write a list of all prescriptions that need to be refilled. Bring that list to your appointment and give it to the admitting nurse. Prescriptions will be written only during appointments. If you forget a medication, it will not be "Called in", "Faxed", or "electronically sent". You will need to get another appointment to get these prescribed. 4. Prescription Accuracy: You are responsible for carefully inspecting your prescriptions before leaving our office. Have the discharge nurse carefully go over each prescription with you, before taking them home. Make sure that your name is accurately spelled, that your address is correct. Check the name and dose of your medication to make sure it is accurate. Check the number of pills, and the written instructions to make sure they are clear and accurate. Make sure that you are given enough medication to  last until your next medication refill appointment. 5. Taking Medication: Take medication as prescribed. Never take more pills than instructed. Never take medication more frequently than prescribed. Taking less pills or less frequently is permitted and encouraged, when it comes to controlled substances (written prescriptions).  6. Inform other Doctors: Always inform, all of your healthcare providers, of all the medications you take. 7. Pain Medication from other Providers: You are not allowed to accept any additional pain medication from any other Doctor or Healthcare provider. There are two exceptions to this rule. (see below) In the event that you require additional pain medication, you are responsible for notifying us, as stated below. 8. Medication Agreement: You are responsible for carefully reading and following our Medication Agreement. This must be signed before receiving any prescriptions from our practice. Safely store a copy of your signed Agreement. Violations to the Agreement will result in no further prescriptions. (Additional copies of our Medication Agreement are available upon request.) 9. Laws, Rules, & Regulations: All patients are expected to follow all Federal and State Laws, Statutes, Rules, & Regulations. Ignorance of the Laws does not constitute a valid excuse. The use of any illegal substances is prohibited. 10. Adopted CDC guidelines & recommendations: Target dosing levels will be at or below 60 MME/day. Use of benzodiazepines** is not recommended.  Exceptions: There are only two exceptions to the rule of not receiving pain medications from other Healthcare Providers. 1. Exception #1 (Emergencies): In the event of an emergency (i.e.: accident requiring emergency care), you are allowed to receive additional pain medication. However, you are responsible for: As soon as you are able, call our office [phone], at any time of the day or night, and leave a message stating your  name, the date and nature of the emergency, and the name and dose of the medication   prescribed. In the event that your call is answered by a member of our staff, make sure to document and save the date, time, and the name of the person that took your information.  2. Exception #2 (Planned Surgery): In the event that you are scheduled by another doctor or dentist to have any type of surgery or procedure, you are allowed (for a period no longer than 30 days), to receive additional pain medication, for the acute post-op pain. However, in this case, you are responsible for picking up a copy of our "Post-op Pain Management for Surgeons" handout, and giving it to your surgeon or dentist. This document is available at our office, and does not require an appointment to obtain it. Simply go to our office during business hours (Monday-Thursday from 8:00 AM to 4:00 PM) (Friday 8:00 AM to 12:00 Noon) or if you have a scheduled appointment with [place], prior to your surgery, and ask for it by name. In addition, you will need to provide [place] with your name, name of your surgeon, type of surgery, and date of procedure or surgery.  *Opioid medications include: morphine, codeine, oxycodone, oxymorphone, hydrocodone, hydromorphone, meperidine, tramadol, tapentadol, buprenorphine, fentanyl, methadone. **Benzodiazepine medications include: diazepam (Valium), alprazolam (Xanax), clonazepam (Klonopine), lorazepam (Ativan), clorazepate (Tranxene), chlordiazepoxide (Librium), estazolam (Prosom), oxazepam (Serax), temazepam (Restoril), triazolam (Halcion)  ____________________________________________________________________________________________  BMI Assessment: Estimated body mass index is 30.73 kg/m as calculated from the following:   Height as of this encounter: 5\' 4"  (1.626 m).   Weight as of this encounter: 179 lb (81.2 kg).  BMI interpretation table: BMI level Category Range association with higher incidence of chronic pain   <18 kg/m2 Underweight   18.5-24.9 kg/m2 Ideal body weight   25-29.9 kg/m2 Overweight Increased incidence by 20%  30-34.9 kg/m2 Obese (Class I) Increased incidence by 68%  35-39.9 kg/m2 Severe obesity (Class II) Increased incidence by 136%  >40 kg/m2 Extreme obesity (Class III) Increased incidence by 254%   BMI Readings from Last 4 Encounters:  10/21/16 30.73 kg/m  09/08/16 30.55 kg/m  08/24/16 31.76 kg/m  07/21/16 33.81 kg/m   Wt Readings from Last 4 Encounters:  10/21/16 179 lb (81.2 kg)  09/08/16 178 lb (80.7 kg)  08/24/16 185 lb (83.9 kg)  07/21/16 197 lb (89.4 kg)

## 2016-10-26 LAB — TOXASSURE SELECT 13 (MW), URINE

## 2017-01-11 ENCOUNTER — Ambulatory Visit: Payer: BLUE CROSS/BLUE SHIELD | Admitting: Nurse Practitioner

## 2017-01-18 ENCOUNTER — Encounter: Payer: Self-pay | Admitting: Nurse Practitioner

## 2017-01-18 ENCOUNTER — Other Ambulatory Visit: Payer: Self-pay

## 2017-01-18 ENCOUNTER — Ambulatory Visit: Payer: BLUE CROSS/BLUE SHIELD | Attending: Nurse Practitioner | Admitting: Nurse Practitioner

## 2017-01-18 VITALS — BP 168/95 | HR 94 | Temp 98.2°F | Resp 16 | Ht 64.0 in | Wt 186.0 lb

## 2017-01-18 DIAGNOSIS — Z880 Allergy status to penicillin: Secondary | ICD-10-CM | POA: Diagnosis not present

## 2017-01-18 DIAGNOSIS — M25551 Pain in right hip: Secondary | ICD-10-CM | POA: Diagnosis present

## 2017-01-18 DIAGNOSIS — G8929 Other chronic pain: Secondary | ICD-10-CM

## 2017-01-18 DIAGNOSIS — Z79891 Long term (current) use of opiate analgesic: Secondary | ICD-10-CM | POA: Insufficient documentation

## 2017-01-18 DIAGNOSIS — G8918 Other acute postprocedural pain: Secondary | ICD-10-CM | POA: Diagnosis not present

## 2017-01-18 DIAGNOSIS — E669 Obesity, unspecified: Secondary | ICD-10-CM | POA: Diagnosis not present

## 2017-01-18 DIAGNOSIS — K589 Irritable bowel syndrome without diarrhea: Secondary | ICD-10-CM | POA: Diagnosis not present

## 2017-01-18 DIAGNOSIS — F329 Major depressive disorder, single episode, unspecified: Secondary | ICD-10-CM | POA: Diagnosis not present

## 2017-01-18 DIAGNOSIS — K219 Gastro-esophageal reflux disease without esophagitis: Secondary | ICD-10-CM | POA: Insufficient documentation

## 2017-01-18 DIAGNOSIS — M25552 Pain in left hip: Secondary | ICD-10-CM | POA: Insufficient documentation

## 2017-01-18 DIAGNOSIS — M25562 Pain in left knee: Secondary | ICD-10-CM | POA: Diagnosis not present

## 2017-01-18 DIAGNOSIS — E7800 Pure hypercholesterolemia, unspecified: Secondary | ICD-10-CM | POA: Diagnosis not present

## 2017-01-18 DIAGNOSIS — E119 Type 2 diabetes mellitus without complications: Secondary | ICD-10-CM | POA: Diagnosis not present

## 2017-01-18 DIAGNOSIS — M25569 Pain in unspecified knee: Secondary | ICD-10-CM | POA: Insufficient documentation

## 2017-01-18 DIAGNOSIS — E781 Pure hyperglyceridemia: Secondary | ICD-10-CM | POA: Diagnosis not present

## 2017-01-18 DIAGNOSIS — E559 Vitamin D deficiency, unspecified: Secondary | ICD-10-CM | POA: Diagnosis not present

## 2017-01-18 DIAGNOSIS — Z79899 Other long term (current) drug therapy: Secondary | ICD-10-CM | POA: Diagnosis not present

## 2017-01-18 DIAGNOSIS — I10 Essential (primary) hypertension: Secondary | ICD-10-CM | POA: Diagnosis not present

## 2017-01-18 DIAGNOSIS — G629 Polyneuropathy, unspecified: Secondary | ICD-10-CM | POA: Diagnosis not present

## 2017-01-18 DIAGNOSIS — G894 Chronic pain syndrome: Secondary | ICD-10-CM | POA: Diagnosis not present

## 2017-01-18 DIAGNOSIS — M47816 Spondylosis without myelopathy or radiculopathy, lumbar region: Secondary | ICD-10-CM | POA: Diagnosis not present

## 2017-01-18 DIAGNOSIS — M5116 Intervertebral disc disorders with radiculopathy, lumbar region: Secondary | ICD-10-CM | POA: Diagnosis not present

## 2017-01-18 DIAGNOSIS — Z885 Allergy status to narcotic agent status: Secondary | ICD-10-CM | POA: Diagnosis not present

## 2017-01-18 DIAGNOSIS — M7918 Myalgia, other site: Secondary | ICD-10-CM | POA: Diagnosis not present

## 2017-01-18 DIAGNOSIS — M545 Low back pain: Secondary | ICD-10-CM | POA: Insufficient documentation

## 2017-01-18 DIAGNOSIS — F419 Anxiety disorder, unspecified: Secondary | ICD-10-CM | POA: Diagnosis not present

## 2017-01-18 DIAGNOSIS — M25561 Pain in right knee: Secondary | ICD-10-CM | POA: Diagnosis not present

## 2017-01-18 MED ORDER — OXYCODONE HCL 5 MG PO TABS: 5.0000 mg | ORAL_TABLET | Freq: Every day | ORAL | 0 refills | Status: DC | PRN

## 2017-01-18 MED ORDER — OXYCODONE HCL 5 MG PO TABS: 5.0000 mg | ORAL_TABLET | Freq: Every day | ORAL | 0 refills | Status: AC | PRN

## 2017-01-18 MED ORDER — GABAPENTIN 300 MG PO CAPS: 300.0000 mg | ORAL_CAPSULE | Freq: Three times a day (TID) | ORAL | 0 refills | Status: DC

## 2017-01-18 NOTE — Progress Notes (Signed)
Nursing Pain Medication Assessment:  Safety precautions to be maintained throughout the outpatient stay will include: orient to surroundings, keep bed in low position, maintain call bell within reach at all times, provide assistance with transfer out of bed and ambulation.  Medication Inspection Compliance: Pill count conducted under aseptic conditions, in front of the patient. Neither the pills nor the bottle was removed from the patient's sight at any time. Once count was completed pills were immediately returned to the patient in their original bottle.  Medication: Oxycodone IR Pill/Patch Count: 0 of 120 pills remain Pill/Patch Appearance: Markings consistent with prescribed medication Bottle Appearance: Standard pharmacy container. Clearly labeled. Filled Date: 18 / 73 / 2018 Last Medication intake:  Ran out of medicine more than 48 hours ago

## 2017-01-18 NOTE — Progress Notes (Signed)
Patient's Name: Kelly Rollins  MRN: [id number]  Referring Provider: Leonel Ramsay, MD  DOB: 1970/09/13  PCP: Kelly Ramsay, MD  DOS: 01/18/2017  Note by: Kelly Francois NP  Service setting: Ambulatory outpatient  Specialty: Interventional Pain Management  Location: [facility] (AMB) Pain Management Facility    Patient type: Established    Primary Reason(s) for Visit: Encounter for prescription drug management. (Level of risk: moderate)  CC: Back Pain (lower, right is worse) and Hip Pain (bilateral, rigth is worse)  HPI  Kelly Rollins is a 46 y.o. year old, female patient, who comes today for a medication management evaluation. She has TOBACCO ABUSE; GERD; Constipation; Pure hypercholesterolemia; Hyperlipidemia; Fatty liver disease; Anxiety; Obesity; Essential hypertriglyceridemia; Long term current use of opiate analgesic; Long term prescription opiate use; Opiate use (25 MME/Day); Encounter for therapeutic drug level monitoring; Encounter for pain management planning; Chronic low back pain (Location of Primary Source of Pain) (Bilateral) (L>R); Lumbar facet syndrome (Location of Primary Source of Pain) (Bilateral) (L>R); Chronic hip pain (Location of Secondary source of pain) (Bilateral) (L>R); Chronic knee pain (Location of Tertiary source of pain) (Bilateral) (L>R); Lumbar spondylosis; Neuropathic pain; Neurogenic pain; Myofascial pain; Acute low back pain; Lumbar transverse process fracture (HCC) (Left L1) (03/11/15); Chronic pain syndrome; Depression; Diabetes mellitus type 2, uncomplicated ([place]); Fatty liver; HTN (hypertension), benign; Vitamin D insufficiency; Acute postoperative pain; and Lumbar radiculopathy on their problem list. Her primarily concern today is the Back Pain (lower, right is worse) and Hip Pain (bilateral, rigth is worse)  Pain Assessment: Location: Lower, Left, Right Back Radiating: into the hips and sometimes leg Onset: More than a month ago Duration: Chronic  pain Quality: Constant, Pressure, Sharp(back pain feels like back labor and the pain in hips and legs feels sharp) Severity: 3 /10 (self-reported pain score)  Note: Reported level is compatible with observation.                          Effect on ADL: has to pace her self Timing: Constant Modifying factors: rest, heat and cold pack, medications  Kelly Rollins was last scheduled for an appointment on 45/19/2018 for medication management. During today's appointment we reviewed Kelly Rollins's chronic pain status, as well as her outpatient medication regimen. She states that the pain goes into the knee with increased activity. She admits that the RFA was hard in the past and seemed to "set her back" in the beginning but she is ready to repeat the injection. She is will to do the series if needed.  She states that she fell in October while going up stairs. She admits that she also fell one week ago going up the same steps. She admits that she gets dizzy sometimes. She admits that she is being treated for the DM and HTN. She states that she did not use her BP medication today and used Mucinex. She states that she has been taking the Oxycodone q 4 hours. She admits that she has not been using the Gabapentin. She states that this is secondary to the use of Effexor.   The patient  reports that she does not use drugs. Her body mass index is 31.93 kg/m.  Further details on both, my assessment(s), as well as the proposed treatment plan, please see below.  Controlled Substance Pharmacotherapy Assessment REMS (Risk Evaluation and Mitigation Strategy)  Analgesic:Oxycodone IRone tablet 5 times a day (25 mg/day) MME/day:37.71m/day .  [name] RN  01/18/2017  9:32 AM  Sign at close encounter Nursing Pain Medication Assessment:  Safety precautions to be maintained throughout the outpatient stay will include: orient to surroundings, keep bed in low position, maintain call bell within reach at all  times, provide assistance with transfer out of bed and ambulation.  Medication Inspection Compliance: Pill count conducted under aseptic conditions, in front of the patient. Neither the pills nor the bottle was removed from the patient's sight at any time. Once count was completed pills were immediately returned to the patient in their original bottle.  Medication: Oxycodone IR Pill/Patch Count: 0 of 120 pills remain Pill/Patch Appearance: Markings consistent with prescribed medication Bottle Appearance: Standard pharmacy container. Clearly labeled. Filled Date: 91 / 21 / 2018 Last Medication intake:  Ran out of medicine more than 48 hours ago   Pharmacokinetics: Liberation and absorption (onset of action): WNL Distribution (time to peak effect): WNL Metabolism and excretion (duration of action): WNL         Pharmacodynamics: Desired effects: Analgesia: Kelly Rollins reports >50% benefit. Functional ability: Patient reports that medication allows her to accomplish basic ADLs Clinically meaningful improvement in function (CMIF): Sustained CMIF goals met Perceived effectiveness: Described as relatively effective, allowing for increase in activities of daily living (ADL) Undesirable effects: Side-effects or Adverse reactions: None reported Monitoring: [place] PMP: Online review of the past 19-monthperiod conducted. Compliant with practice rules and regulations Last UDS on record: Summary  Date Value Ref Range Status  10/21/2016 FINAL  Final    Comment:    ==================================================================== TOXASSURE SELECT 13 (MW) ==================================================================== Test                             Result       Flag       Units Drug Absent but Declared for Prescription Verification   Oxycodone                      Not Detected UNEXPECTED ng/mg creat ==================================================================== Test                       Result    Flag   Units      Ref Range   Creatinine              94               mg/dL      >=20 ==================================================================== Declared Medications:  The flagging and interpretation on this report are based on the  following declared medications.  Unexpected results may arise from  inaccuracies in the declared medications.  **Note: The testing scope of this panel includes these medications:  Oxycodone  **Note: The testing scope of this panel does not include following  reported medications:  Acetaminophen  Albuterol  Aspirin  Caffeine  Cholecalciferol  Gabapentin  Ibuprofen  Lisinopril  Magnesium  Metformin  Metoprolol  Multivitamin  Vitamin C ==================================================================== For clinical consultation, please call ([phone] ====================================================================    UDS interpretation: Compliant          Medication Assessment Form: Reviewed. Patient indicates being compliant with therapy Treatment compliance: Compliant Risk Assessment Profile: Aberrant behavior: See prior evaluations. None observed or detected today Comorbid factors increasing risk of overdose: See prior notes. No additional risks detected today Risk of substance use disorder (SUD): Low Opioid Risk Tool - 01/18/17 0930      Family History of  Substance Abuse   Alcohol  Negative    Illegal Drugs  Negative    Rx Drugs  Negative      Personal History of Substance Abuse   Alcohol  Negative    Illegal Drugs  Negative    Rx Drugs  Negative      Psychological Disease   Psychological Disease  Negative    Depression  Negative      Total Score   Opioid Risk Tool Scoring  0    Opioid Risk Interpretation  Low Risk      ORT Scoring interpretation table:  Score <3 = Low Risk for SUD  Score between 4-7 = Moderate Risk for SUD  Score >8 = High Risk for Opioid Abuse   Risk Mitigation Strategies:   Patient Counseling: Covered Patient-Prescriber Agreement (PPA): Present and active  Notification to other healthcare providers: Done  Pharmacologic Plan: No change in therapy, at this time  Laboratory Chemistry  Inflammation Markers (CRP: Acute Phase) (ESR: Chronic Phase) Lab Results  Component Value Date   CRP 2.5 (H) 04/28/2016   ESRSEDRATE 40 (H) 04/28/2016                 Rheumatology Markers No results found for: Benay Spice, LYMEIGGIGMAB, [facility]              Renal Function Markers Lab Results  Component Value Date   BUN 16 09/08/2016   CREATININE 0.80 09/08/2016   GFRAA 104 09/08/2016   GFRNONAA 90 09/08/2016                 Hepatic Function Markers Lab Results  Component Value Date   AST 31 09/08/2016   ALT 34 (H) 09/08/2016   ALBUMIN 4.2 09/08/2016   ALKPHOS 73 09/08/2016   AMYLASE 40 04/03/2010   LIPASE 25.0 04/03/2010                 Electrolytes Lab Results  Component Value Date   NA 135 09/08/2016   K 4.6 09/08/2016   CL 97 09/08/2016   CALCIUM 9.7 09/08/2016   MG 2.1 04/28/2016                 Neuropathy Markers Lab Results  Component Value Date   VITAMINB12 638 04/28/2016                 Bone Pathology Markers Lab Results  Component Value Date   25OHVITD1 27 (L) 04/28/2016   25OHVITD2 3.7 04/28/2016   25OHVITD3 23 04/28/2016                 Coagulation Parameters Lab Results  Component Value Date   PLT 278 08/24/2016                 Cardiovascular Markers Lab Results  Component Value Date   CKTOTAL 109 03/18/2013   CKMB 1.7 03/18/2013   TROPONINI <0.03 08/24/2016   HGB 13.9 08/24/2016   HCT 39.1 08/24/2016                 CA Markers No results found for: CEA, CA125, LABCA2               Note: Lab results reviewed.  Recent Diagnostic Imaging Results  MR LUMBAR SPINE WO CONTRAST CLINICAL DATA:  [place] [address] down stairs February 2017. LBP with bilateral leg pain. Right leg worse. Pain upper leg mainly. Leg  weakness worse after sitting or standing. Problems worse since fall 2017.  EXAM: MRI LUMBAR SPINE WITHOUT CONTRAST  TECHNIQUE: Multiplanar, multisequence MR imaging of the lumbar spine was performed. No intravenous contrast was administered.  COMPARISON:  None.  FINDINGS: Segmentation:  Standard.  Alignment:  Physiologic.  Vertebrae:  No fracture, evidence of discitis, or bone lesion.  Conus medullaris: Extends to the T12 level and appears normal.  Paraspinal and other soft tissues: Negative.  Disc levels:  Disc spaces: Disc desiccation at L5-S1.  T12-L1: No significant disc bulge. No evidence of neural foraminal stenosis. No central canal stenosis.  L1-L2: No significant disc bulge. No evidence of neural foraminal stenosis. No central canal stenosis.  L2-L3: No significant disc bulge. No evidence of neural foraminal stenosis. No central canal stenosis.  L3-L4: No significant disc bulge. No evidence of neural foraminal stenosis. No central canal stenosis.  L4-L5: No significant disc bulge. No evidence of neural foraminal stenosis. No central canal stenosis. Mild bilateral facet arthropathy.  L5-S1: Broad-based disc bulge with a small central disc protrusion. Mild bilateral facet arthropathy. No evidence of neural foraminal stenosis. No central canal stenosis.  IMPRESSION: 1. At L5-S1 there is a broad-based disc bulge with a small central disc protrusion. Mild bilateral facet arthropathy. 2. At L4-5 there is mild bilateral facet arthropathy.  Electronically Signed   By: Kathreen Devoid   On: 09/29/2016 13:54  Complexity Note: Imaging results reviewed. Results shared with Kelly Rollins, using Layman's terms.                         Meds   Current Outpatient Medications:  .  albuterol (PROVENTIL HFA) 108 (90 Base) MCG/ACT inhaler, Inhale 2 puffs into the lungs 3 (three) times daily., Disp: , Rfl:  .  Ascorbic Acid (VITAMIN C) 100 MG tablet, Take 100 mg by mouth  daily., Disp: , Rfl:  .  Aspirin-Acetaminophen-Caffeine (EXCEDRIN EXTRA STRENGTH PO), Take 2 tablets by mouth as needed., Disp: , Rfl:  .  Cholecalciferol (VITAMIN D3) 1000 units CAPS, Take by mouth daily., Disp: , Rfl:  .  gabapentin (NEURONTIN) 300 MG capsule, Take 1 capsule (300 mg total) by mouth every 8 (eight) hours., Disp: 270 capsule, Rfl: 0 .  ibuprofen (ADVIL,MOTRIN) 200 MG tablet, Take by mouth as needed. , Disp: , Rfl:  .  Magnesium 250 MG TABS, Take by mouth at bedtime., Disp: , Rfl:  .  metoprolol succinate (TOPROL-XL) 100 MG 24 hr tablet, Take 100 mg by mouth 2 (two) times daily. 50 mg in the a.m. And 50 mg qhs, Disp: , Rfl:  .  Multiple Vitamin (MULTIVITAMIN) tablet, Take 1 tablet by mouth daily., Disp: , Rfl:  .  ondansetron (ZOFRAN-ODT) 4 MG disintegrating tablet, Take 1 tablet by mouth 4 (four) times daily., Disp: , Rfl:  .  oxyCODONE (OXY IR/ROXICODONE) 5 MG immediate release tablet, Take 1 tablet (5 mg total) by mouth every 6 (six) hours as needed for severe pain., Disp: 120 tablet, Rfl: 0 .  PROAIR HFA 108 (90 Base) MCG/ACT inhaler, , Disp: , Rfl: 4 .  venlafaxine (EFFEXOR) 25 MG tablet, Take 37.5 mg by mouth daily. , Disp: , Rfl:  .  albuterol (PROAIR HFA) 108 (90 Base) MCG/ACT inhaler, Inhale 2 puffs into the lungs every 6 (six) hours as needed. , Disp: , Rfl:  .  metFORMIN (GLUCOPHAGE) 500 MG tablet, Take by mouth., Disp: , Rfl:  .  [START ON 03/19/2017] oxyCODONE (OXY IR/ROXICODONE) 5 MG immediate release tablet, Take 1 tablet (5 mg total)  by mouth 5 (five) times daily as needed for severe pain., Disp: 150 tablet, Rfl: 0 .  [START ON 02/17/2017] oxyCODONE (OXY IR/ROXICODONE) 5 MG immediate release tablet, Take 1 tablet (5 mg total) by mouth 5 (five) times daily as needed for severe pain., Disp: 150 tablet, Rfl: 0 .  oxyCODONE (OXY IR/ROXICODONE) 5 MG immediate release tablet, Take 1 tablet (5 mg total) by mouth 5 (five) times daily as needed for severe pain., Disp: 150 tablet,  Rfl: 0  ROS  Constitutional: Denies any fever or chills Gastrointestinal: No reported hemesis, hematochezia, vomiting, or acute GI distress Musculoskeletal: Denies any acute onset joint swelling, redness, loss of ROM, or weakness Neurological: No reported episodes of acute onset apraxia, aphasia, dysarthria, agnosia, amnesia, paralysis, loss of coordination, or loss of consciousness  Allergies  Kelly Rollins is allergic to morphine; penicillins; and tramadol hcl.  Bodfish  Drug: Kelly Rollins  reports that she does not use drugs. Alcohol:  reports that she drinks alcohol. Tobacco:  reports that she quit smoking about 2 years ago. Her smoking use included cigarettes. she has never used smokeless tobacco. Medical:  has a past medical history of ABDOMINAL PAIN OTHER SPECIFIED SITE (02/21/2009), ABDOMINAL PAIN-LUQ (04/17/2009), Anal fissure, Diabetes mellitus without complication ([place]), DPOEUMPN(361.4), HEMATOCHEZIA (04/17/2009), Hypertension, IBS (irritable bowel syndrome), Migraine headache (11/06/2008), OVARIAN CYST, RIGHT (02/21/2009), and PALPITATIONS, OCCASIONAL (04/17/2010). Surgical: Kelly Rollins  has a past surgical history that includes Cesarean section; Vaginal hysterectomy; Cholecystectomy; and Breast reduction surgery. Family: family history includes Cancer in her mother; Heart disease in her father; Hyperlipidemia in her father and mother; Hypertension in her father and mother.  Constitutional Exam  General appearance: Well nourished, well developed, and well hydrated. In no apparent acute distress Vitals:   01/18/17 0917  BP: (!) 168/95  Pulse: 94  Resp: 16  Temp: 98.2 F (36.8 C)  TempSrc: Oral  SpO2: 99%  Weight: 186 lb (84.4 kg)  Height: '5\' 4"'  (1.626 m)   BMI Assessment: Estimated body mass index is 31.93 kg/m as calculated from the following:   Height as of this encounter: '5\' 4"'  (1.626 m).   Weight as of this encounter: 186 lb (84.4 kg). Psych/Mental status: Alert, oriented x 3  (person, place, & time)       Eyes: PERLA Respiratory: No evidence of acute respiratory distress  Lumbar Spine Area Exam  Skin & Axial Inspection: No masses, redness, or swelling Alignment: Symmetrical Functional ROM: Unrestricted ROM      Stability: No instability detected Muscle Tone/Strength: Functionally intact. No obvious neuro-muscular anomalies detected. Sensory (Neurological): Unimpaired Palpation: Complains of area being tender to palpation       Provocative Tests: Lumbar Hyperextension and rotation test: Positive bilaterally for facet joint pain. Lumbar Lateral bending test: evaluation deferred today       Patrick's Maneuver: evaluation deferred today                    Gait & Posture Assessment  Ambulation: Unassisted Gait: Relatively normal for age and body habitus Posture: WNL   Lower Extremity Exam    Side: Right lower extremity  Side: Left lower extremity  Skin & Extremity Inspection: Skin color, temperature, and hair growth are WNL. No peripheral edema or cyanosis. No masses, redness, swelling, asymmetry, or associated skin lesions. No contractures.  Skin & Extremity Inspection: Skin color, temperature, and hair growth are WNL. No peripheral edema or cyanosis. No masses, redness, swelling, asymmetry, or associated skin lesions. No contractures.  Functional ROM: Unrestricted ROM          Functional ROM: Unrestricted ROM          Muscle Tone/Strength: Functionally intact. No obvious neuro-muscular anomalies detected.  Muscle Tone/Strength: Functionally intact. No obvious neuro-muscular anomalies detected.  Sensory (Neurological): Unimpaired  Sensory (Neurological): Unimpaired  Palpation: No palpable anomalies  Palpation: No palpable anomalies   Assessment  Primary Diagnosis & Pertinent Problem List: The primary encounter diagnosis was Chronic low back pain (Location of Primary Source of Pain) (Bilateral) (L>R). Diagnoses of Lumbar facet syndrome (Location of Primary  Source of Pain) (Bilateral) (L>R), Chronic knee pain (Location of Tertiary source of pain) (Bilateral) (L>R), Chronic pain syndrome, Chronic pain, and Long term current use of opiate analgesic were also pertinent to this visit.  Status Diagnosis  Controlled Controlled Controlled 1. Chronic low back pain (Location of Primary Source of Pain) (Bilateral) (L>R)   2. Lumbar facet syndrome (Location of Primary Source of Pain) (Bilateral) (L>R)   3. Chronic knee pain (Location of Tertiary source of pain) (Bilateral) (L>R)   4. Chronic pain syndrome   5. Chronic pain   6. Long term current use of opiate analgesic     Problems updated and reviewed during this visit: No problems updated. Plan of Care  Pharmacotherapy (Medications Ordered): Meds ordered this encounter  Medications  . gabapentin (NEURONTIN) 300 MG capsule    Sig: Take 1 capsule (300 mg total) by mouth every 8 (eight) hours.    Dispense:  270 capsule    Refill:  0    Do not place this medication, or any other prescription from our practice, on "Automatic Refill". Patient may have prescription filled one day early if pharmacy is closed on scheduled refill date.    Order Specific Question:   Supervising Provider    Answer:   Milinda Pointer [phone]  . oxyCODONE (OXY IR/ROXICODONE) 5 MG immediate release tablet    Sig: Take 1 tablet (5 mg total) by mouth 5 (five) times daily as needed for severe pain.    Dispense:  150 tablet    Refill:  0    Do not place this medication, or any other prescription from our practice, on "Automatic Refill". Patient may have prescription filled one day early if pharmacy is closed on scheduled refill date. Do not fill until:03/19/2017 To last until: 04/18/2017    Order Specific Question:   Supervising Provider    Answer:   Milinda Pointer [phone]  . oxyCODONE (OXY IR/ROXICODONE) 5 MG immediate release tablet    Sig: Take 1 tablet (5 mg total) by mouth 5 (five) times daily as needed for severe  pain.    Dispense:  150 tablet    Refill:  0    Do not place this medication, or any other prescription from our practice, on "Automatic Refill". Patient may have prescription filled one day early if pharmacy is closed on scheduled refill date. Do not fill until: 02/17/2017 To last until: 03/19/2017    Order Specific Question:   Supervising Provider    Answer:   Milinda Pointer [phone]  . oxyCODONE (OXY IR/ROXICODONE) 5 MG immediate release tablet    Sig: Take 1 tablet (5 mg total) by mouth 5 (five) times daily as needed for severe pain.    Dispense:  150 tablet    Refill:  0    Do not place this medication, or any other prescription from our practice, on "Automatic Refill". Patient may have prescription filled one day  early if pharmacy is closed on scheduled refill date. Do not fill until:01/18/2017 To last until:02/17/2017    Order Specific Question:   Supervising Provider    Answer:   Milinda Pointer [phone]  This SmartLink is deprecated. Use AVSMEDLIST instead to display the medication list for a patient. Medications administered today: Courtney Heys had no medications administered during this visit. Lab-work, procedure(s), and/or referral(s): Orders Placed This Encounter  Procedures  . Radiofrequency,Lumbar  . ToxASSURE Select 13 (MW), Urine   Imaging and/or referral(s): None  Interventional therapies: Planned, scheduled, and/or pending:    Right sided L-RFA with sedation   Considering:   Diagnostic bilateral hip joint injection Palliative bilateral lumbar facet block  Bilateral lumbar facet radiofrequency ablation.    Palliative PRN treatment(s):   Diagnostic bilateral hip joint injection  Palliative Bilateral lumbar facet block plus left L1 transverse process injection   Provider-requested follow-up: Return in about 3 months (around 04/18/2017) for MedMgmt with Me Dionisio David), Lumbar RFA, w/ Dr. Dossie Arbour, Procedure(w/Sedation), (ASAA).  No future  appointments. Primary Care Physician: Kelly Ramsay, MD Location: [facility] Outpatient Pain Management Facility Note by: Kelly Francois NP Date: 01/18/2017; Time: 10:14 AM  Pain Score Disclaimer: We use the NRS-11 scale. This is a self-reported, subjective measurement of pain severity with only modest accuracy. It is used primarily to identify changes within a particular patient. It must be understood that outpatient pain scales are significantly less accurate that those used for research, where they can be applied under ideal controlled circumstances with minimal exposure to variables. In reality, the score is likely to be a combination of pain intensity and pain affect, where pain affect describes the degree of emotional arousal or changes in action readiness caused by the sensory experience of pain. Factors such as social and work situation, setting, emotional state, anxiety levels, expectation, and prior pain experience may influence pain perception and show large inter-individual differences that may also be affected by time variables.  Patient instructions provided during this appointment: Patient Instructions    ____________________________________________________________________________________________  Medication Rules  Applies to: All patients receiving prescriptions (written or electronic).  Pharmacy of record: Pharmacy where electronic prescriptions will be sent. If written prescriptions are taken to a different pharmacy, please inform the nursing staff. The pharmacy listed in the electronic medical record should be the one where you would like electronic prescriptions to be sent.  Prescription refills: Only during scheduled appointments. Applies to both, written and electronic prescriptions.  NOTE: The following applies primarily to controlled substances (Opioid* Pain Medications).   Patient's responsibilities: 1. Pain Pills: Bring all pain pills to every appointment (except  for procedure appointments). 2. Pill Bottles: Bring pills in original pharmacy bottle. Always bring newest bottle. Bring bottle, even if empty. 3. Medication refills: You are responsible for knowing and keeping track of what medications you need refilled. The day before your appointment, write a list of all prescriptions that need to be refilled. Bring that list to your appointment and give it to the admitting nurse. Prescriptions will be written only during appointments. If you forget a medication, it will not be "Called in", "Faxed", or "electronically sent". You will need to get another appointment to get these prescribed. 4. Prescription Accuracy: You are responsible for carefully inspecting your prescriptions before leaving our office. Have the discharge nurse carefully go over each prescription with you, before taking them home. Make sure that your name is accurately spelled, that your address is correct. Check the name and  dose of your medication to make sure it is accurate. Check the number of pills, and the written instructions to make sure they are clear and accurate. Make sure that you are given enough medication to last until your next medication refill appointment. 5. Taking Medication: Take medication as prescribed. Never take more pills than instructed. Never take medication more frequently than prescribed. Taking less pills or less frequently is permitted and encouraged, when it comes to controlled substances (written prescriptions).  6. Inform other Doctors: Always inform, all of your healthcare providers, of all the medications you take. 7. Pain Medication from other Providers: You are not allowed to accept any additional pain medication from any other Doctor or Healthcare provider. There are two exceptions to this rule. (see below) In the event that you require additional pain medication, you are responsible for notifying us, as stated below. 8. Medication Agreement: You are responsible for  carefully reading and following our Medication Agreement. This must be signed before receiving any prescriptions from our practice. Safely store a copy of your signed Agreement. Violations to the Agreement will result in no further prescriptions. (Additional copies of our Medication Agreement are available upon request.) 9. Laws, Rules, & Regulations: All patients are expected to follow all Federal and [employer], [employer], Rules, [employer]. Ignorance of the Laws does not constitute a valid excuse. The use of any illegal substances is prohibited. 10. Adopted CDC guidelines & recommendations: Target dosing levels will be at or below 60 MME/day. Use of benzodiazepines** is not recommended.  Exceptions: There are only two exceptions to the rule of not receiving pain medications from other Healthcare Providers. 1. Exception #1 (Emergencies): In the event of an emergency (i.e.: accident requiring emergency care), you are allowed to receive additional pain medication. However, you are responsible for: As soon as you are able, call our office [phone], at any time of the day or night, and leave a message stating your name, the date and nature of the emergency, and the name and dose of the medication prescribed. In the event that your call is answered by a member of our staff, make sure to document and save the date, time, and the name of the person that took your information.  2. Exception #2 (Planned Surgery): In the event that you are scheduled by another doctor or dentist to have any type of surgery or procedure, you are allowed (for a period no longer than 30 days), to receive additional pain medication, for the acute post-op pain. However, in this case, you are responsible for picking up a copy of our "Post-op Pain Management for Surgeons" handout, and giving it to your surgeon or dentist. This document is available at our office, and does not require an appointment to obtain it. Simply go to our  office during business hours (Monday-Thursday from 8:00 AM to 4:00 PM) (Friday 8:00 AM to 12:00 Noon) or if you have a scheduled appointment with [place], prior to your surgery, and ask for it by name. In addition, you will need to provide [place] with your name, name of your surgeon, type of surgery, and date of procedure or surgery.  *Opioid medications include: morphine, codeine, oxycodone, oxymorphone, hydrocodone, hydromorphone, meperidine, tramadol, tapentadol, buprenorphine, fentanyl, methadone. **Benzodiazepine medications include: diazepam (Valium), alprazolam (Xanax), clonazepam (Klonopine), lorazepam (Ativan), clorazepate (Tranxene), chlordiazepoxide (Librium), estazolam (Prosom), oxazepam (Serax), temazepam (Restoril), triazolam (Halcion)  ____________________________________________________________________________________________   BMI interpretation table: BMI level Category Range association with higher incidence of chronic pain  <18  kg/m2 Underweight   18.5-24.9 kg/m2 Ideal body weight   25-29.9 kg/m2 Overweight Increased incidence by 20%  30-34.9 kg/m2 Obese (Class I) Increased incidence by 68%  35-39.9 kg/m2 Severe obesity (Class II) Increased incidence by 136%  >40 kg/m2 Extreme obesity (Class III) Increased incidence by 254%   BMI Readings from Last 4 Encounters:  01/18/17 31.93 kg/m  10/21/16 30.73 kg/m  09/08/16 30.55 kg/m  08/24/16 31.76 kg/m   Wt Readings from Last 4 Encounters:  01/18/17 186 lb (84.4 kg)  10/21/16 179 lb (81.2 kg)  09/08/16 178 lb (80.7 kg)  08/24/16 185 lb (83.9 kg)

## 2017-01-18 NOTE — Patient Instructions (Addendum)
____________________________________________________________________________________________  Medication Rules  Applies to: All patients receiving prescriptions (written or electronic).  Pharmacy of record: Pharmacy where electronic prescriptions will be sent. If written prescriptions are taken to a different pharmacy, please inform the nursing staff. The pharmacy listed in the electronic medical record should be the one where you would like electronic prescriptions to be sent.  Prescription refills: Only during scheduled appointments. Applies to both, written and electronic prescriptions.  NOTE: The following applies primarily to controlled substances (Opioid* Pain Medications).   Patient's responsibilities: 1. Pain Pills: Bring all pain pills to every appointment (except for procedure appointments). 2. Pill Bottles: Bring pills in original pharmacy bottle. Always bring newest bottle. Bring bottle, even if empty. 3. Medication refills: You are responsible for knowing and keeping track of what medications you need refilled. The day before your appointment, write a list of all prescriptions that need to be refilled. Bring that list to your appointment and give it to the admitting nurse. Prescriptions will be written only during appointments. If you forget a medication, it will not be "Called in", "Faxed", or "electronically sent". You will need to get another appointment to get these prescribed. 4. Prescription Accuracy: You are responsible for carefully inspecting your prescriptions before leaving our office. Have the discharge nurse carefully go over each prescription with you, before taking them home. Make sure that your name is accurately spelled, that your address is correct. Check the name and dose of your medication to make sure it is accurate. Check the number of pills, and the written instructions to make sure they are clear and accurate. Make sure that you are given enough medication to  last until your next medication refill appointment. 5. Taking Medication: Take medication as prescribed. Never take more pills than instructed. Never take medication more frequently than prescribed. Taking less pills or less frequently is permitted and encouraged, when it comes to controlled substances (written prescriptions).  6. Inform other Doctors: Always inform, all of your healthcare providers, of all the medications you take. 7. Pain Medication from other Providers: You are not allowed to accept any additional pain medication from any other Doctor or Healthcare provider. There are two exceptions to this rule. (see below) In the event that you require additional pain medication, you are responsible for notifying us, as stated below. 8. Medication Agreement: You are responsible for carefully reading and following our Medication Agreement. This must be signed before receiving any prescriptions from our practice. Safely store a copy of your signed Agreement. Violations to the Agreement will result in no further prescriptions. (Additional copies of our Medication Agreement are available upon request.) 9. Laws, Rules, & Regulations: All patients are expected to follow all Federal and State Laws, Statutes, Rules, & Regulations. Ignorance of the Laws does not constitute a valid excuse. The use of any illegal substances is prohibited. 10. Adopted CDC guidelines & recommendations: Target dosing levels will be at or below 60 MME/day. Use of benzodiazepines** is not recommended.  Exceptions: There are only two exceptions to the rule of not receiving pain medications from other Healthcare Providers. 1. Exception #1 (Emergencies): In the event of an emergency (i.e.: accident requiring emergency care), you are allowed to receive additional pain medication. However, you are responsible for: As soon as you are able, call our office [phone], at any time of the day or night, and leave a message stating your  name, the date and nature of the emergency, and the name and dose of the medication   prescribed. In the event that your call is answered by a member of our staff, make sure to document and save the date, time, and the name of the person that took your information.  2. Exception #2 (Planned Surgery): In the event that you are scheduled by another doctor or dentist to have any type of surgery or procedure, you are allowed (for a period no longer than 30 days), to receive additional pain medication, for the acute post-op pain. However, in this case, you are responsible for picking up a copy of our "Post-op Pain Management for Surgeons" handout, and giving it to your surgeon or dentist. This document is available at our office, and does not require an appointment to obtain it. Simply go to our office during business hours (Monday-Thursday from 8:00 AM to 4:00 PM) (Friday 8:00 AM to 12:00 Noon) or if you have a scheduled appointment with [place], prior to your surgery, and ask for it by name. In addition, you will need to provide [place] with your name, name of your surgeon, type of surgery, and date of procedure or surgery.  *Opioid medications include: morphine, codeine, oxycodone, oxymorphone, hydrocodone, hydromorphone, meperidine, tramadol, tapentadol, buprenorphine, fentanyl, methadone. **Benzodiazepine medications include: diazepam (Valium), alprazolam (Xanax), clonazepam (Klonopine), lorazepam (Ativan), clorazepate (Tranxene), chlordiazepoxide (Librium), estazolam (Prosom), oxazepam (Serax), temazepam (Restoril), triazolam (Halcion)  ____________________________________________________________________________________________   BMI interpretation table: BMI level Category Range association with higher incidence of chronic pain  <18 kg/m2 Underweight   18.5-24.9 kg/m2 Ideal body weight   25-29.9 kg/m2 Overweight Increased incidence by 20%  30-34.9 kg/m2 Obese (Class I) Increased incidence by 68%  35-39.9 kg/m2  Severe obesity (Class II) Increased incidence by 136%  >40 kg/m2 Extreme obesity (Class III) Increased incidence by 254%   BMI Readings from Last 4 Encounters:  01/18/17 31.93 kg/m  10/21/16 30.73 kg/m  09/08/16 30.55 kg/m  08/24/16 31.76 kg/m   Wt Readings from Last 4 Encounters:  01/18/17 186 lb (84.4 kg)  10/21/16 179 lb (81.2 kg)  09/08/16 178 lb (80.7 kg)  08/24/16 185 lb (83.9 kg)    Prescription for oxyodone given x3.

## 2017-01-22 LAB — TOXASSURE SELECT 13 (MW), URINE

## 2017-02-03 ENCOUNTER — Emergency Department
Admission: EM | Admit: 2017-02-03 | Discharge: 2017-02-03 | Disposition: A | Discharge status: Home / Self Care | Payer: BLUE CROSS/BLUE SHIELD | Attending: Emergency Medicine | Admitting: Emergency Medicine

## 2017-02-03 ENCOUNTER — Encounter: Payer: Self-pay | Admitting: Emergency Medicine

## 2017-02-03 ENCOUNTER — Emergency Department: Payer: BLUE CROSS/BLUE SHIELD

## 2017-02-03 ENCOUNTER — Other Ambulatory Visit: Payer: Self-pay

## 2017-02-03 DIAGNOSIS — Z87891 Personal history of nicotine dependence: Secondary | ICD-10-CM | POA: Diagnosis not present

## 2017-02-03 DIAGNOSIS — L509 Urticaria, unspecified: Secondary | ICD-10-CM | POA: Diagnosis not present

## 2017-02-03 DIAGNOSIS — Z79899 Other long term (current) drug therapy: Secondary | ICD-10-CM | POA: Insufficient documentation

## 2017-02-03 DIAGNOSIS — J040 Acute laryngitis: Secondary | ICD-10-CM | POA: Diagnosis not present

## 2017-02-03 DIAGNOSIS — I10 Essential (primary) hypertension: Secondary | ICD-10-CM | POA: Diagnosis not present

## 2017-02-03 DIAGNOSIS — E119 Type 2 diabetes mellitus without complications: Secondary | ICD-10-CM | POA: Diagnosis not present

## 2017-02-03 DIAGNOSIS — Z7984 Long term (current) use of oral hypoglycemic drugs: Secondary | ICD-10-CM | POA: Insufficient documentation

## 2017-02-03 DIAGNOSIS — J069 Acute upper respiratory infection, unspecified: Secondary | ICD-10-CM | POA: Diagnosis present

## 2017-02-03 DIAGNOSIS — J40 Bronchitis, not specified as acute or chronic: Secondary | ICD-10-CM | POA: Diagnosis not present

## 2017-02-03 DIAGNOSIS — T7840XA Allergy, unspecified, initial encounter: Secondary | ICD-10-CM

## 2017-02-03 LAB — COMPREHENSIVE METABOLIC PANEL
ALBUMIN: 3.3 g/dL — AB (ref 3.5–5.0)
ALK PHOS: 95 U/L (ref 38–126)
ALT: 16 U/L (ref 14–54)
AST: 31 U/L (ref 15–41)
Anion gap: 10 (ref 5–15)
BILIRUBIN TOTAL: 0.2 mg/dL — AB (ref 0.3–1.2)
BUN: 14 mg/dL (ref 6–20)
CO2: 25 mmol/L (ref 22–32)
CREATININE: 0.76 mg/dL (ref 0.44–1.00)
Calcium: 8.9 mg/dL (ref 8.9–10.3)
Chloride: 102 mmol/L (ref 101–111)
GFR calc Af Amer: >60 mL/min (ref >60)
GLUCOSE: 174 mg/dL — AB (ref 65–99)
Potassium: 4.1 mmol/L (ref 3.5–5.1)
Sodium: 137 mmol/L (ref 135–145)
TOTAL PROTEIN: 7.2 g/dL (ref 6.5–8.1)

## 2017-02-03 LAB — CBC WITH DIFFERENTIAL/PLATELET
Basophils Absolute: 0.1 10*3/uL (ref 0–0.1)
Basophils Relative: 1 %
EOS ABS: 0 10*3/uL (ref 0–0.7)
Eosinophils Relative: 0 %
HCT: 36.1 % (ref 35.0–47.0)
HEMOGLOBIN: 12.1 g/dL (ref 12.0–16.0)
LYMPHS ABS: 1.7 10*3/uL (ref 1.0–3.6)
LYMPHS PCT: 12 %
MCH: 29.3 pg (ref 26.0–34.0)
MCHC: 33.5 g/dL (ref 32.0–36.0)
MCV: 87.5 fL (ref 80.0–100.0)
MONOS PCT: 7 %
Monocytes Absolute: 0.9 10*3/uL (ref 0.2–0.9)
Neutro Abs: 11.8 10*3/uL — ABNORMAL HIGH (ref 1.4–6.5)
Neutrophils Relative %: 80 %
Platelets: 336 10*3/uL (ref 150–440)
RBC: 4.12 MIL/uL (ref 3.80–5.20)
RDW: 14.8 % — ABNORMAL HIGH (ref 11.5–14.5)
WBC: 14.5 10*3/uL — ABNORMAL HIGH (ref 3.6–11.0)

## 2017-02-03 LAB — INFLUENZA PANEL BY PCR (TYPE A & B)
INFLAPCR: NEGATIVE
Influenza B By PCR: NEGATIVE

## 2017-02-03 LAB — MONONUCLEOSIS SCREEN: Mono Screen: NEGATIVE

## 2017-02-03 LAB — TROPONIN I: Troponin I: <0.03 ng/mL (ref <0.03)

## 2017-02-03 LAB — LACTIC ACID, PLASMA
Lactic Acid, Venous: 2 mmol/L (ref 0.5–1.9)
Lactic Acid, Venous: 2 mmol/L (ref 0.5–1.9)

## 2017-02-03 MED ORDER — ALBUTEROL SULFATE HFA 108 (90 BASE) MCG/ACT IN AERS: 2.0000 | INHALATION_SPRAY | Freq: Four times a day (QID) | RESPIRATORY_TRACT | 0 refills | Status: DC | PRN

## 2017-02-03 MED ORDER — BENZONATATE 100 MG PO CAPS
ORAL_CAPSULE | ORAL | Status: DC
Start: 2017-02-03 — End: 2017-02-03
  Filled 2017-02-03: qty 2

## 2017-02-03 MED ORDER — IPRATROPIUM-ALBUTEROL 0.5-2.5 (3) MG/3ML IN SOLN
3.0000 mL | Freq: Once | RESPIRATORY_TRACT | Status: AC
  Administered 2017-02-03: 3 mL via RESPIRATORY_TRACT
  Filled 2017-02-03: qty 3

## 2017-02-03 MED ORDER — BENZONATATE 100 MG PO CAPS
200.0000 mg | ORAL_CAPSULE | Freq: Once | ORAL | Status: AC
  Administered 2017-02-03: 200 mg via ORAL

## 2017-02-03 MED ORDER — DIPHENHYDRAMINE HCL 50 MG/ML IJ SOLN
25.0000 mg | Freq: Once | INTRAMUSCULAR | Status: AC
  Administered 2017-02-03: 25 mg via INTRAVENOUS
  Filled 2017-02-03: qty 1

## 2017-02-03 MED ORDER — BENZONATATE 100 MG PO CAPS: 100.0000 mg | ORAL_CAPSULE | Freq: Three times a day (TID) | ORAL | 0 refills | Status: DC | PRN

## 2017-02-03 MED ORDER — SODIUM CHLORIDE 0.9 % IV BOLUS (SEPSIS)
1000.0000 mL | Freq: Once | INTRAVENOUS | Status: AC
  Administered 2017-02-03: 1000 mL via INTRAVENOUS

## 2017-02-03 NOTE — ED Notes (Signed)
Pt given meal tray and water to drink. Ok per Dr. Reita Cliche

## 2017-02-03 NOTE — ED Triage Notes (Addendum)
Pt to ed with c/o vision changes. States she has been sick recently.  Pt states acute onset of right eye vision blurriness about 1 hour pta.  Pt states vision is better at this time.  Some noted swelling to right eyelid.  No facial drooping noted.  No difficulty walking, no aphasia noted.  Also noted hives to body all over.  Pt states this is completely new in the last few hours.  Pt reports recent use of abx. Pt also c/o sob, tightness in throat and tightness in chest.  Pt taken straight to room 12.

## 2017-02-03 NOTE — ED Provider Notes (Signed)
[facility] Emergency Department Provider Note ____________________________________________   I have reviewed the triage vital signs and the triage nursing note.  HISTORY  Chief Complaint Weakness; Shortness of Breath; and Urticaria   Historian Patient and spouse  HPI Kelly Rollins is a 47 y.o. female with a history of former smoker, using an inhaler at times, presents with upper respiratory illness and sore throat since several weeks.  She states that she was diagnosed with possible strep throat back around Thanksgiving.  She feels like she never really fully got better from the sore throat.  Around Christmas she developed body aches and some coughing and her doctor called in an antibiotic which she was on for several days.  She developed a rash underneath her right breast and persistent symptoms and so antibiotic was changed from the initial antibiotic which she does not recall, to Levaquin.  She has also been on prednisone here for the last several days as well.  She is taking her inhaler.  Patient states that she does not tolerate prednisone very well and today she was feeling kind of "bad."  She describes this as feeling like her heart was beating strongly in the center of her body.  She reported feeling a little bit woozy or nauseated.  She states her daughter noticed that her right side of the face was swollen and she felt like she might be having some vision issues on that side.  She denies any aphasia, slurred speech, facial droop, weakness or numbness.  She is not really had a fever.  Since coming here she is also notes that she has an itchy hives rash on her trunk and arms.   Past Medical History:  Diagnosis Date  . ABDOMINAL PAIN OTHER SPECIFIED SITE 02/21/2009   Qualifier: Diagnosis of  By: Deborra Medina MD, Tanja Port    . ABDOMINAL PAIN-LUQ 04/17/2009   Qualifier: Diagnosis of  By: Fuller Plan MD Marijo Conception Anal fissure   . Diabetes mellitus without  complication ([place])   . Headache(784.0)   . HEMATOCHEZIA 04/17/2009   Qualifier: Diagnosis of  By: Fuller Plan MD Marijo Conception Hypertension   . IBS (irritable bowel syndrome)   . Migraine headache 11/06/2008   Qualifier: Diagnosis of  By: Deborra Medina MD, Tanja Port    . OVARIAN CYST, RIGHT 02/21/2009   Qualifier: Diagnosis of  By: Deborra Medina MD, Tanja Port    . PALPITATIONS, OCCASIONAL 04/17/2010   Qualifier: Diagnosis of  By: Deborra Medina MD, Talia      Patient Active Problem List   Diagnosis Date Noted  . Lumbar radiculopathy 09/08/2016  . Acute postoperative pain 06/08/2016  . Vitamin D insufficiency 05/04/2016  . Depression 02/04/2016  . Diabetes mellitus type 2, uncomplicated ([place]) 66/07/3014  . Fatty liver 02/04/2016  . HTN (hypertension), benign 02/04/2016  . Chronic pain syndrome 01/15/2016  . Acute low back pain 05/28/2015  . Lumbar transverse process fracture ([place]) (Left L1) (03/11/15) 05/28/2015  . Long term current use of opiate analgesic 05/01/2015  . Long term prescription opiate use 05/01/2015  . Opiate use (25 MME/Day) 05/01/2015  . Encounter for therapeutic drug level monitoring 05/01/2015  . Encounter for pain management planning 05/01/2015  . Chronic low back pain (Location of Primary Source of Pain) (Bilateral) (L>R) 05/01/2015  . Lumbar facet syndrome (Location of Primary Source of Pain) (Bilateral) (L>R) 05/01/2015  . Chronic hip pain (Location of Secondary source of pain) (Bilateral) (L>R) 05/01/2015  . Chronic knee pain (  Location of Tertiary source of pain) (Bilateral) (L>R) 05/01/2015  . Lumbar spondylosis 05/01/2015  . Neuropathic pain 05/01/2015  . Neurogenic pain 05/01/2015  . Myofascial pain 05/01/2015  . Essential hypertriglyceridemia 04/26/2014  . Anxiety 05/15/2010  . Obesity 05/15/2010  . Hyperlipidemia 04/03/2010  . Fatty liver disease 04/02/2010  . Pure hypercholesterolemia 03/19/2010  . GERD 04/17/2009  . Constipation 04/17/2009  . TOBACCO ABUSE 11/06/2008    Past  Surgical History:  Procedure Laterality Date  . BREAST REDUCTION SURGERY     bilateral  . CESAREAN SECTION    . CHOLECYSTECTOMY    . VAGINAL HYSTERECTOMY      Prior to Admission medications   Medication Sig Start Date End Date Taking? Authorizing Provider  albuterol (PROAIR HFA) 108 (90 Base) MCG/ACT inhaler Inhale 2 puffs into the lungs every 6 (six) hours as needed.  05/03/15 02/03/17 Yes [provider]  Ascorbic Acid (VITAMIN C) 100 MG tablet Take 100 mg by mouth daily.   Yes [provider]  levofloxacin (LEVAQUIN) 500 MG tablet Take 500 mg by mouth daily.   Yes [provider]  metFORMIN (GLUCOPHAGE) 500 MG tablet Take 500 mg by mouth 2 (two) times daily with a meal.  10/06/16 02/03/17 Yes [provider]  metoprolol succinate (TOPROL-XL) 100 MG 24 hr tablet Take 100 mg by mouth daily.  11/09/16  Yes [provider]  Multiple Vitamin (MULTIVITAMIN) tablet Take 1 tablet by mouth daily.   Yes [provider]  predniSONE (DELTASONE) 5 MG tablet Take 5 mg by mouth daily with breakfast.   Yes [provider]  venlafaxine (EFFEXOR) 25 MG tablet Take 37.5 mg by mouth daily.    Yes [provider]  albuterol (PROVENTIL HFA;VENTOLIN HFA) 108 (90 Base) MCG/ACT inhaler Inhale 2 puffs into the lungs every 6 (six) hours as needed for wheezing or shortness of breath. 02/03/17   Lisa Roca, MD  Aspirin-Acetaminophen-Caffeine (EXCEDRIN EXTRA STRENGTH PO) Take 2 tablets by mouth as needed.    [provider]  benzonatate (TESSALON PERLES) 100 MG capsule Take 1 capsule (100 mg total) by mouth 3 (three) times daily as needed for cough. 02/03/17   Lisa Roca, MD  gabapentin (NEURONTIN) 300 MG capsule Take 1 capsule (300 mg total) by mouth every 8 (eight) hours. Patient not taking: Reported on 02/03/2017 01/18/17 04/18/17  Vevelyn Francois, NP  ibuprofen (ADVIL,MOTRIN) 200 MG tablet Take by mouth as needed.     [provider]   ondansetron (ZOFRAN-ODT) 4 MG disintegrating tablet Take 1 tablet by mouth 4 (four) times daily. 11/06/16   [provider]  oxyCODONE (OXY IR/ROXICODONE) 5 MG immediate release tablet Take 1 tablet (5 mg total) by mouth 5 (five) times daily as needed for severe pain. 03/19/17 04/18/17  Vevelyn Francois, NP  oxyCODONE (OXY IR/ROXICODONE) 5 MG immediate release tablet Take 1 tablet (5 mg total) by mouth 5 (five) times daily as needed for severe pain. Patient not taking: Reported on 02/03/2017 02/17/17 03/19/17  Vevelyn Francois, NP  oxyCODONE (OXY IR/ROXICODONE) 5 MG immediate release tablet Take 1 tablet (5 mg total) by mouth 5 (five) times daily as needed for severe pain. Patient not taking: Reported on 02/03/2017 01/18/17 02/17/17  Vevelyn Francois, NP    Allergies  Allergen Reactions  . Morphine Nausea And Vomiting    Other reaction(s): Vomiting REACTION: Headache REACTION: Headache  . Penicillins Rash    Has patient had a PCN reaction causing immediate rash, facial/tongue/throat  swelling, SOB or lightheadedness with hypotension: Yes Has patient had a PCN reaction causing severe rash involving mucus membranes or skin necrosis: No Has patient had a PCN reaction that required hospitalization: No Has patient had a PCN reaction occurring within the last 10 years: No. If all of the above answers are "NO", then may proceed with Cephalosporin use.  . Tramadol Hcl Nausea And Vomiting    Other reaction(s): Vomiting "feels weird"    Family History  Problem Relation Age of Onset  . Cancer Mother        throat in the 58's  . Hyperlipidemia Mother   . Hypertension Mother   . Hyperlipidemia Father   . Hypertension Father   . Heart disease Father        S/P two stents    Social History Social History   Tobacco Use  . Smoking status: Former Smoker    Types: Cigarettes    Last attempt to quit: 09/03/2014    Years since quitting: 2.4  . Smokeless tobacco: Never Used  Substance Use Topics   . Alcohol use: Yes    Comment: occassional  . Drug use: No    Review of Systems  Constitutional: Negative for fever. Eyes: Negative for visual changes. ENT: Negative for sore throat. Cardiovascular: Negative for chest pain. Respiratory: Mild wheezing and shortness of breath with a bronchospastic cough. Gastrointestinal: Negative for abdominal pain, vomiting and diarrhea. Genitourinary: Negative for dysuria. Musculoskeletal: Negative for back pain. Skin: Negative for rash. Neurological: Negative for headache.  ____________________________________________   PHYSICAL EXAM:  VITAL SIGNS: ED Triage Vitals  Enc Vitals Group     BP 02/03/17 1457 (!) 175/104     Pulse Rate 02/03/17 1457 98     Resp 02/03/17 1457 16     Temp 02/03/17 1457 98.6 F (37 C)     Temp Source 02/03/17 1457 Oral     SpO2 02/03/17 1457 99 %     Weight 02/03/17 1458 188 lb (85.3 kg)     Height 02/03/17 1458 5\' 1"  (1.549 m)     Head Circumference --      Peak Flow --      Pain Score 02/03/17 1457 0     Pain Loc --      Pain Edu? --      Excl. in [place]? --      Constitutional: Alert and oriented. Well appearing and in no distress. HEENT   Head: Normocephalic and atraumatic.      Eyes: Conjunctivae are normal. Pupils equal and round.  Right eyelid edema, no erythema.  No conjunctivitis.  Vision intact.  Extraocular movements intact.  No eye pain.      Ears:         Nose: No congestion/rhinnorhea.   Mouth/Throat: Mucous membranes are moist.  Mild erythema to the posterior oropharynx, without any swelling, no enlarged tonsils.   Neck: No stridor. Cardiovascular/Chest: Normal rate, regular rhythm.  No murmurs, rubs, or gallops. Respiratory: Normal respiratory effort without tachypnea nor retractions.  Breath sounds are clear and equal, however mild decreased air movement with significant bronchospastic cough. Gastrointestinal: Soft. No distention, no guarding, no rebound. Nontender.     Genitourinary/rectal:Deferred Musculoskeletal: Nontender with normal range of motion in all extremities. No joint effusions.  No lower extremity tenderness.  No edema. Neurologic: No slurred speech.  Cranial nerves II through X intact.  She does have the right eyelid edema which initially looked a little like drooping, but she has good  motion.  Normal speech and language. No gross or focal neurologic deficits are appreciated. Skin: She has multiple areas of small hives on her left and right upper extremities as well as her chest. Psychiatric: Mood and affect are normal. Speech and behavior are normal. Patient exhibits appropriate insight and judgment.   ____________________________________________  LABS (pertinent positives/negatives) I, Lisa Roca, MD the attending physician have reviewed the labs noted below.  Labs Reviewed  COMPREHENSIVE METABOLIC PANEL - Abnormal; Notable for the following components:      Result Value   Glucose, Bld 174 (*)    Albumin 3.3 (*)    Total Bilirubin 0.2 (*)    All other components within normal limits  LACTIC ACID, PLASMA - Abnormal; Notable for the following components:   Lactic Acid, Venous 2.0 (*)    All other components within normal limits  CBC WITH DIFFERENTIAL/PLATELET - Abnormal; Notable for the following components:   WBC 14.5 (*)    RDW 14.8 (*)    Neutro Abs 11.8 (*)    All other components within normal limits  MONONUCLEOSIS SCREEN  TROPONIN I  INFLUENZA PANEL BY PCR (TYPE A & B)  LACTIC ACID, PLASMA    ____________________________________________    EKG I, Lisa Roca, MD, the attending physician have personally viewed and interpreted all ECGs.  89 beats per minute.  Normal sinus rhythm.  Narrow QS.  Normal axis.  Normal ST and T wave ____________________________________________  RADIOLOGY All Xrays were viewed by me.  Imaging interpreted by Radiologist, and I, Lisa Roca, MD the attending physician have reviewed the  radiologist interpretation noted below.  Chest x-ray two-view:  IMPRESSION: No acute cardiopulmonary findings. __________________________________________  PROCEDURES  Procedure(s) performed: None  Critical Care performed: None   ____________________________________________  ED COURSE / ASSESSMENT AND PLAN  Pertinent labs & imaging results that were available during my care of the patient were reviewed by me and considered in my medical decision making (see chart for details).   Patient here because she has been sick for over a week now, changed antibiotics at least once, currently on Levaquin and prednisone for bronchitis and upper respiratory/pharyngeal/URI infection.  She is here because she does not like she is getting better, and also she noted today right eyelid swelling as well as hives across her body.  She is on prednisone right now which makes it slightly unusual that she is got hives.  I am going to treat her with Benadryl.  The right eyelid swelling is unilateral but does not appear to be cellulitic in nature.  She is not having actually any true vision changes.  She is not having any true neurologic deficits.  Her symptoms appear to be coming from the edema which I think is probably related to the systemic allergic type reaction that she is having.  Most likely initiating factor may be the new antibiotic Levaquin.  Reevaluation around 8 PM, patient feels better, the hives are gone, patient's right eyelid edema looks much decreased.  The eye swelling does not appear to be a cellulitis that looks more edematous.  I do not have a suspicion for orbital or periorbital cellulitis.  From the standpoint of her upper respiratory infection, it seems like it still bronchospastic and obviously laryngitis with a slight hoarse voice.  She is not having significant trouble swallowing or any hypoxia.  Her chest x-ray is clear for no pneumonia.  Right now I do not think that there is a  lot of benefit to  adding additional antibiotic and I think there is concerned that if I add a new antibiotic that this could confuse the situation in case she has hives again.  She is comfortable with this plan of discontinuing antibiotic.  With respect to the allergic reaction, the hoarseness and bronchitis were there before the allergic reaction, I do not think she is having any additional issues I am not concerned about anaphylaxis.  Given several weeks now with breathing issues, I think that potentially she would benefit from next step evaluation follow-up with her primary care doctor very closely, but also potentially pulmonology referral.  And I will give her the number for that.  Initial lactate 2, awaiting repeat lactate after IV fluids for discharge home tonight.  DIFFERENTIAL DIAGNOSIS: Including but not limited to pneumonia, bronchitis, laryngitis, sepsis, drug reaction, cellulitis to the face, conjunctivitis, unspecified allergic reaction, etc.  CONSULTATIONS: None  Patient / Family / Caregiver informed of clinical course, medical decision-making process, and agree with plan.   I discussed return precautions, follow-up instructions, and discharge instructions with patient and/or family.  Discharge Instructions : You are evaluated for continued hoarse voice and wheezing as well as new hives and swelling around the right eye, and your exam and evaluation are overall reassuring in the emergency room today.  Return to emergency department immediately for any worsening condition including trouble breathing, chest pain, fever, confusion or altered mental status, skin rash or redness, any vision problems or eye pain especially with moving the eye.  As we discussed, I think that the skin rash and swelling are due to allergic reaction and I am most suspicious about the most recent antibiotic, Levaquin.  Discontinue the Levaquin.  List rash to Levaquin as 1 of your drug allergies.  As we  discussed, I still think that you are probably fighting a virus causing laryngitis and bronchitis.  You may increase your use of the inhaler to 2 puffs every 4 hours as needed.  Please continue your prednisone.  You may take Benadryl over-the-counter 25 mg every 4 hours as needed for itching or skin rash.      ___________________________________________   FINAL CLINICAL IMPRESSION(S) / ED DIAGNOSES   Final diagnoses:  Hives  Allergic reaction, initial encounter  Laryngitis  Bronchitis      ___________________________________________        Note: This dictation was prepared with Dragon dictation. Any transcriptional errors that result from this process are unintentional    Lisa Roca, MD 02/03/17 2035

## 2017-02-03 NOTE — ED Notes (Signed)
Pt taken to xray 

## 2017-02-03 NOTE — ED Notes (Signed)
Date and time results received: 02/03/17 1734   Test:lactic acid  Critical Value: 2.0  Name of Provider Notified: Dr. Reita Cliche

## 2017-02-03 NOTE — Discharge Instructions (Signed)
You are evaluated for continued hoarse voice and wheezing as well as new hives and swelling around the right eye, and your exam and evaluation are overall reassuring in the emergency room today.  Return to emergency department immediately for any worsening condition including trouble breathing, chest pain, fever, confusion or altered mental status, skin rash or redness, any vision problems or eye pain especially with moving the eye.  As we discussed, I think that the skin rash and swelling are due to allergic reaction and I am most suspicious about the most recent antibiotic, Levaquin.  Discontinue the Levaquin.  List rash to Levaquin as 1 of your drug allergies.  As we discussed, I still think that you are probably fighting a virus causing laryngitis and bronchitis.  You may increase your use of the inhaler to 2 puffs every 4 hours as needed.  Please continue your prednisone.  You may take Benadryl over-the-counter 25 mg every 4 hours as needed for itching or skin rash.

## 2017-02-22 ENCOUNTER — Encounter: Payer: Self-pay | Admitting: Internal Medicine

## 2017-02-22 ENCOUNTER — Ambulatory Visit: Payer: BLUE CROSS/BLUE SHIELD | Admitting: Internal Medicine

## 2017-02-22 VITALS — BP 158/90 | HR 94 | Resp 16 | Ht 64.0 in | Wt 186.8 lb

## 2017-02-22 DIAGNOSIS — Z87891 Personal history of nicotine dependence: Secondary | ICD-10-CM

## 2017-02-22 DIAGNOSIS — J4520 Mild intermittent asthma, uncomplicated: Secondary | ICD-10-CM | POA: Diagnosis not present

## 2017-02-22 DIAGNOSIS — G894 Chronic pain syndrome: Secondary | ICD-10-CM

## 2017-02-22 DIAGNOSIS — R059 Cough, unspecified: Secondary | ICD-10-CM

## 2017-02-22 DIAGNOSIS — R0602 Shortness of breath: Secondary | ICD-10-CM | POA: Diagnosis not present

## 2017-02-22 DIAGNOSIS — R05 Cough: Secondary | ICD-10-CM

## 2017-02-22 DIAGNOSIS — G4710 Hypersomnia, unspecified: Secondary | ICD-10-CM | POA: Diagnosis not present

## 2017-02-22 NOTE — Patient Instructions (Signed)

## 2017-02-22 NOTE — Progress Notes (Signed)
[facility] [place], [place] [address]  Pulmonary Sleep Medicine  Office Visit Note NEW CONSULT  Patient Name: Kelly Rollins DOB: 07/13/1970 MRN [id number]  Date of Service: 02/22/2017  Complaints/HPI:  Patient has multiple medical problems including asthma obesity hypersomnia.  Patient has been having increasing shortness breath some cough and congestion.  She diagnosis previously asthmatic bronchitis.  The patient has been on albuterol as needed but she is not on any kind maintenance inhalers.  She also admits to having hypersomnia she snores wakes up tired she gets headaches during the daytime.  She was seen by her chronic pain specialist and was told that she needed to have a sleep evaluation done.  On questioning she admits to several feature is such as the hypersomnia the daytime headaches the excessive daytime somnolence and also the waking up unrefreshed.  She also wakes up in the middle of the night gas being for breath.  She is a former smoker.  She has not smoked for several years now.  ROS  General: (-) fever, (-) chills, (-) night sweats, (-) weakness +hypersomnia Skin: (-) rashes, (-) itching,. Eyes: (-) visual changes, (-) redness, (-) itching. Nose and Sinuses: (-) nasal stuffiness or itchiness, (-) postnasal drip, (-) nosebleeds, (-) sinus trouble. Mouth and Throat: (-) sore throat, (-) hoarseness. Neck: (-) swollen glands, (-) enlarged thyroid, (-) neck pain. Respiratory: + cough, (-) bloody sputum, + shortness of breath, + wheezing. Cardiovascular: - ankle swelling, (-) chest pain. Lymphatic: (-) lymph node enlargement. Neurologic: (-) numbness, (-) tingling. Psychiatric: (-) anxiety, (-) depression   Current Medication: Outpatient Encounter Medications as of 02/22/2017  Medication Sig Note  . albuterol (PROVENTIL HFA;VENTOLIN HFA) 108 (90 Base) MCG/ACT inhaler Inhale 2 puffs into the lungs every 6 (six) hours as needed for wheezing or  shortness of breath.   . Ascorbic Acid (VITAMIN C) 100 MG tablet Take 100 mg by mouth daily.   Marland Kitchen ibuprofen (ADVIL,MOTRIN) 200 MG tablet Take by mouth as needed.    . metoprolol succinate (TOPROL-XL) 100 MG 24 hr tablet Take 100 mg by mouth daily.  02/03/2017: Pt states she has been taking 39m at bedtime  . Multiple Vitamin (MULTIVITAMIN) tablet Take 1 tablet by mouth daily.   .Derrill MemoON 03/19/2017] oxyCODONE (OXY IR/ROXICODONE) 5 MG immediate release tablet Take 1 tablet (5 mg total) by mouth 5 (five) times daily as needed for severe pain.   .Marland Kitchenvenlafaxine (EFFEXOR) 25 MG tablet Take 37.5 mg by mouth daily.    .Marland Kitchenalbuterol (PROAIR HFA) 108 (90 Base) MCG/ACT inhaler Inhale 2 puffs into the lungs every 6 (six) hours as needed.    . Aspirin-Acetaminophen-Caffeine (EXCEDRIN EXTRA STRENGTH PO) Take 2 tablets by mouth as needed.   . benzonatate (TESSALON PERLES) 100 MG capsule Take 1 capsule (100 mg total) by mouth 3 (three) times daily as needed for cough. (Patient not taking: Reported on 02/22/2017)   . gabapentin (NEURONTIN) 300 MG capsule Take 1 capsule (300 mg total) by mouth every 8 (eight) hours. (Patient not taking: Reported on 02/03/2017)   . levofloxacin (LEVAQUIN) 500 MG tablet Take 500 mg by mouth daily.   . metFORMIN (GLUCOPHAGE) 500 MG tablet Take 500 mg by mouth 2 (two) times daily with a meal.    . ondansetron (ZOFRAN-ODT) 4 MG disintegrating tablet Take 1 tablet by mouth 4 (four) times daily.   .Marland KitchenoxyCODONE (OXY IR/ROXICODONE) 5 MG immediate release tablet Take 1 tablet (5 mg total) by  mouth 5 (five) times daily as needed for severe pain. (Patient not taking: Reported on 02/03/2017)   . predniSONE (DELTASONE) 5 MG tablet Take 5 mg by mouth daily with breakfast. 02/03/2017: Took 70m out of 271mtoday   No facility-administered encounter medications on file as of 02/22/2017.     Surgical History: Past Surgical History:  Procedure Laterality Date  . BREAST REDUCTION SURGERY     bilateral  .  CESAREAN SECTION    . CHOLECYSTECTOMY    . VAGINAL HYSTERECTOMY      Medical History: Past Medical History:  Diagnosis Date  . ABDOMINAL PAIN OTHER SPECIFIED SITE 02/21/2009   Qualifier: Diagnosis of  By: ArDeborra MedinaD, TaTanja Port  . ABDOMINAL PAIN-LUQ 04/17/2009   Qualifier: Diagnosis of  By: StFuller PlanD FAMarijo Conceptionnal fissure   . Diabetes mellitus without complication ([place]  . Headache(784.0)   . HEMATOCHEZIA 04/17/2009   Qualifier: Diagnosis of  By: StFuller PlanD FAMarijo Conceptionypertension   . IBS (irritable bowel syndrome)   . Migraine headache 11/06/2008   Qualifier: Diagnosis of  By: ArDeborra MedinaD, TaTanja Port  . OVARIAN CYST, RIGHT 02/21/2009   Qualifier: Diagnosis of  By: ArDeborra MedinaD, TaTanja Port  . PALPITATIONS, OCCASIONAL 04/17/2010   Qualifier: Diagnosis of  By: ArDeborra MedinaD, Talia      Family History: Family History  Problem Relation Age of Onset  . Cancer Mother        throat in the 4078's. Hyperlipidemia Mother   . Hypertension Mother   . Hyperlipidemia Father   . Hypertension Father   . Heart disease Father        S/P two stents    Social History: Social History   Socioeconomic History  . Marital status: Married    Spouse name: Not on file  . Number of children: 3  . Years of education: Not on file  . Highest education level: Not on file  Social Needs  . Financial resource strain: Not on file  . Food insecurity - worry: Not on file  . Food insecurity - inability: Not on file  . Transportation needs - medical: Not on file  . Transportation needs - non-medical: Not on file  Occupational History  . 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 Smoking status: Former Smoker    Types: Cigarettes    Last attempt to quit: 09/03/2014    Years since quitting: 2.4  . Smokeless tobacco: Never Used  Substance and Sexual Activity  . Alcohol use: Yes    Comment: occassional  . Drug use: No  . Sexual activity: Yes  Other Topics  Concern  . Not on file  Social History Narrative   Lives with husband and 3 daughters, [id number]    Vital Signs: Blood pressure (!) 158/90, pulse 94, resp. rate 16, height '5\' 4"'  (1.626 m), weight 186 lb 12.8 oz (84.7 kg), SpO2 98 %.  Examination: General Appearance: The patient is well-developed, well-nourished, and in no distress. Skin: Gross inspection of skin unremarkable. Head: normocephalic, no gross deformities. Eyes: no gross deformities noted. ENT: ears appear grossly normal no exudates. Neck: Supple. No thyromegaly. No LAD. Respiratory:   Good aeration noted bilaterally she has no rhonchi noted at this time. Cardiovascular: Normal S1 and S2 without murmur or rub. Extremities: No cyanosis. pulses are equal. Neurologic: Alert and oriented.  No involuntary movements.  LABS: Recent Results (from the past 2160 hour(s))  ToxASSURE Select 13 (MW), Urine     Status: None   Collection Time: 01/18/17 10:23 AM  Result Value Ref Range   Summary FINAL     Comment: ==================================================================== TOXASSURE SELECT 13 (MW) ==================================================================== Test                             Result       Flag       Units Drug Absent but Declared for Prescription Verification   Oxycodone                      Not Detected UNEXPECTED ng/mg creat ==================================================================== Test                      Result    Flag   Units      Ref Range   Creatinine              209              mg/dL      >=20 ==================================================================== Declared Medications:  The flagging and interpretation on this report are based on the  following declared medications.  Unexpected results may arise from  inaccuracies in the declared medications.  **Note: The testing scope of this panel includes these medications:  Oxycodone  **Note: The testing scope of this  panel does not include following  reported medications:  A cetaminophen  Albuterol  Aspirin  Caffeine  Gabapentin  Ibuprofen  Magnesium  Metformin  Metoprolol (Toprol)  Multivitamin (MVI)  Ondansetron (Zofran)  Venlafaxine (Effexor)  Vitamin C  Vitamin D3 ==================================================================== For clinical consultation, please call [phone]. ====================================================================   Mononucleosis screen     Status: None   Collection Time: 02/03/17  4:34 PM  Result Value Ref Range   Mono Screen NEGATIVE NEGATIVE    Comment: Performed at [facility], [address] [place], [place] [address]  Comprehensive metabolic panel     Status: Abnormal   Collection Time: 02/03/17  4:34 PM  Result Value Ref Range   Sodium 137 135 - 145 mmol/L   Potassium 4.1 3.5 - 5.1 mmol/L   Chloride 102 101 - 111 mmol/L   CO2 25 22 - 32 mmol/L   Glucose, Bld 174 (H) 65 - 99 mg/dL   BUN 14 6 - 20 mg/dL   Creatinine, Ser 0.76 0.44 - 1.00 mg/dL   Calcium 8.9 8.9 - 10.3 mg/dL   Total Protein 7.2 6.5 - 8.1 g/dL   Albumin 3.3 (L) 3.5 - 5.0 g/dL   AST 31 15 - 41 U/L   ALT 16 14 - 54 U/L   Alkaline Phosphatase 95 38 - 126 U/L   Total Bilirubin 0.2 (L) 0.3 - 1.2 mg/dL   GFR calc non Af Amer >60 >60 mL/min   GFR calc Af Amer >60 >60 mL/min    Comment: (NOTE) The eGFR has been calculated using the CKD EPI equation. This calculation has not been validated in all clinical situations. eGFR's persistently <60 mL/min signify possible Chronic Kidney Disease.    Anion gap 10 5 - 15    Comment: Performed at [facility], [address] [place], [place] [address]  Troponin I     Status: None   Collection Time: 02/03/17  4:34 PM  Result Value Ref Range  Troponin I <0.03 <0.03 ng/mL    Comment: Performed at [facility], [address] [place], [place] [address]  Lactic acid, plasma     Status: Abnormal    Collection Time: 02/03/17  4:34 PM  Result Value Ref Range   Lactic Acid, Venous 2.0 (HH) 0.5 - 1.9 mmol/L    Comment: CRITICAL RESULT CALLED TO, READ BACK BY AND VERIFIED WITH KATE BUMGARNER AT 1732 ON 02/03/2017 JJB Performed at [facility], [address] [place], [place] [address]   CBC with Differential     Status: Abnormal   Collection Time: 02/03/17  4:34 PM  Result Value Ref Range   WBC 14.5 (H) 3.6 - 11.0 K/uL   RBC 4.12 3.80 - 5.20 MIL/uL   Hemoglobin 12.1 12.0 - 16.0 g/dL   HCT 36.1 35.0 - 47.0 %   MCV 87.5 80.0 - 100.0 fL   MCH 29.3 26.0 - 34.0 pg   MCHC 33.5 32.0 - 36.0 g/dL   RDW 14.8 (H) 11.5 - 14.5 %   Platelets 336 150 - 440 K/uL   Neutrophils Relative % 80 %   Neutro Abs 11.8 (H) 1.4 - 6.5 K/uL   Lymphocytes Relative 12 %   Lymphs Abs 1.7 1.0 - 3.6 K/uL   Monocytes Relative 7 %   Monocytes Absolute 0.9 0.2 - 0.9 K/uL   Eosinophils Relative 0 %   Eosinophils Absolute 0.0 0 - 0.7 K/uL   Basophils Relative 1 %   Basophils Absolute 0.1 0 - 0.1 K/uL    Comment: Performed at [facility], [address] [place], [place] [address]  Influenza panel by PCR (type A & B)     Status: None   Collection Time: 02/03/17  4:34 PM  Result Value Ref Range   Influenza A By PCR NEGATIVE NEGATIVE   Influenza B By PCR NEGATIVE NEGATIVE    Comment: (NOTE) The Xpert Xpress Flu assay is intended as an aid in the diagnosis of  influenza and should not be used as a sole basis for treatment.  This  assay is FDA approved for nasopharyngeal swab specimens only. Nasal  washings and aspirates are unacceptable for Xpert Xpress Flu testing. Performed at [facility], [address] [place], [place] [address]   Lactic acid, plasma     Status: Abnormal   Collection Time: 02/03/17  5:21 PM  Result Value Ref Range   Lactic Acid, Venous 2.0 (HH) 0.5 - 1.9 mmol/L    Comment: CRITICAL RESULT CALLED TO, READ BACK BY AND VERIFIED WITH KATE BUMGARNER AT 2036 ON  02/03/2017 JJB Performed at [facility], [address], [place], [place] [address]     Radiology: Dg Chest 2 View  Result Date: 02/03/2017 CLINICAL DATA:  Acute onset of right eye blurriness. EXAM: CHEST  2 VIEW COMPARISON:  08/24/2016 FINDINGS: The cardiac silhouette, mediastinal and hilar contours are within normal limits and stable. Mild eventration of the right hemidiaphragm with overlying atelectasis but no infiltrates or effusions. No pulmonary lesions. The bony thorax is intact. IMPRESSION: No acute cardiopulmonary findings. Electronically Signed   By: Marijo Sanes M.D.   On: 02/03/2017 15:46    No results found.  Dg Chest 2 View  Result Date: 02/03/2017 CLINICAL DATA:  Acute onset of right eye blurriness. EXAM: CHEST  2 VIEW COMPARISON:  08/24/2016 FINDINGS: The cardiac silhouette, mediastinal and hilar contours are within normal limits and stable. Mild eventration of the right hemidiaphragm with overlying  atelectasis but no infiltrates or effusions. No pulmonary lesions. The bony thorax is intact. IMPRESSION: No acute cardiopulmonary findings. Electronically Signed   By: Marijo Sanes M.D.   On: 02/03/2017 15:46      Assessment and Plan: Patient Active Problem List   Diagnosis Date Noted  . Lumbar radiculopathy 09/08/2016  . Acute postoperative pain 06/08/2016  . Vitamin D insufficiency 05/04/2016  . Depression 02/04/2016  . Diabetes mellitus type 2, uncomplicated ([place]) 41/58/3094  . Fatty liver 02/04/2016  . HTN (hypertension), benign 02/04/2016  . Chronic pain syndrome 01/15/2016  . Acute low back pain 05/28/2015  . Lumbar transverse process fracture ([place]) (Left L1) (03/11/15) 05/28/2015  . Long term current use of opiate analgesic 05/01/2015  . Long term prescription opiate use 05/01/2015  . Opiate use (25 MME/Day) 05/01/2015  . Encounter for therapeutic drug level monitoring 05/01/2015  . Encounter for pain management planning 05/01/2015  . Chronic low back  pain (Location of Primary Source of Pain) (Bilateral) (L>R) 05/01/2015  . Lumbar facet syndrome (Location of Primary Source of Pain) (Bilateral) (L>R) 05/01/2015  . Chronic hip pain (Location of Secondary source of pain) (Bilateral) (L>R) 05/01/2015  . Chronic knee pain (Location of Tertiary source of pain) (Bilateral) (L>R) 05/01/2015  . Lumbar spondylosis 05/01/2015  . Neuropathic pain 05/01/2015  . Neurogenic pain 05/01/2015  . Myofascial pain 05/01/2015  . Essential hypertriglyceridemia 04/26/2014  . Anxiety 05/15/2010  . Obesity 05/15/2010  . Hyperlipidemia 04/03/2010  . Fatty liver disease 04/02/2010  . Pure hypercholesterolemia 03/19/2010  . GERD 04/17/2009  . Constipation 04/17/2009  . TOBACCO ABUSE 11/06/2008    1. Asthma Has had a recent flare She was placed on levaquin did not tolerate Also is on steroids Will need PFT evalaution done She is on albuterol MDi and also a neb No LABA/Steroid combo Would add breo to her regimen sample given  2. Former Smoker No longer smoking has been a heavy smoker  3. Cough Overall clinically improving Will monitor for recurrence  4. Shortness of breath As noted due to Asthma and also former smoker  5. Chronic Pain Seen by pain clinic  6. Hypersmonia She has EDS headaches and wakes up choking and gasping at night She is overweight will need a PSG done   General Counseling: I have discussed the findings of the evaluation and examination with Anderson Malta.  I have also discussed any further diagnostic evaluation thatmay be needed or ordered today. Sergio verbalizes understanding of the findings of todays visit. We also reviewed her medications today and discussed drug interactions and side effects including but not limited excessive drowsiness and altered mental states. We also discussed that there is always a risk not just to her but also people around her. she has been encouraged to call the office with any questions or concerns  that should arise related to todays visit.    Time spent: 15  I have personally obtained a history, examined the patient, evaluated laboratory and imaging results, formulated the assessment and plan and placed orders.    Allyne Gee, MD [facility] Pulmonary and Critical Care Sleep medicine

## 2017-03-01 ENCOUNTER — Other Ambulatory Visit: Payer: Self-pay | Admitting: Nurse Practitioner

## 2017-03-11 ENCOUNTER — Telehealth: Payer: Self-pay

## 2017-03-11 NOTE — Telephone Encounter (Signed)
LEFT MESSAGE AND ASKED PT TO CALL AND SCHD PSG SLEEP STUDY DONE IN OFFICE/ BR

## 2017-03-23 ENCOUNTER — Encounter (INDEPENDENT_AMBULATORY_CARE_PROVIDER_SITE_OTHER): Payer: Self-pay

## 2017-03-24 ENCOUNTER — Ambulatory Visit: Payer: Self-pay | Admitting: Internal Medicine

## 2017-03-30 ENCOUNTER — Other Ambulatory Visit (INDEPENDENT_AMBULATORY_CARE_PROVIDER_SITE_OTHER): Payer: BLUE CROSS/BLUE SHIELD | Admitting: Internal Medicine

## 2017-03-30 DIAGNOSIS — G4733 Obstructive sleep apnea (adult) (pediatric): Secondary | ICD-10-CM

## 2017-03-31 ENCOUNTER — Other Ambulatory Visit: Payer: Self-pay | Admitting: Internal Medicine

## 2017-03-31 ENCOUNTER — Ambulatory Visit (INDEPENDENT_AMBULATORY_CARE_PROVIDER_SITE_OTHER): Payer: BLUE CROSS/BLUE SHIELD | Admitting: Internal Medicine

## 2017-03-31 DIAGNOSIS — G479 Sleep disorder, unspecified: Secondary | ICD-10-CM

## 2017-03-31 DIAGNOSIS — J4520 Mild intermittent asthma, uncomplicated: Secondary | ICD-10-CM | POA: Diagnosis not present

## 2017-04-05 ENCOUNTER — Ambulatory Visit: Payer: Self-pay | Admitting: Internal Medicine

## 2017-04-08 ENCOUNTER — Encounter: Payer: Self-pay | Admitting: Internal Medicine

## 2017-04-08 NOTE — Patient Instructions (Signed)

## 2017-04-08 NOTE — Progress Notes (Signed)
[place], [address]  DATE OF SERVICE:  March 31, 2017  Complete Pulmonary Function Testing Interpretation:  FINDINGS:   forced vital capacity is normal FEV1 is normal FEV1 FVC ratio is normal.  Post bronchodilator there is no significant change in the FEV1 however clinical improvement may occur in the absence of spirometric improvement and should not preclude the use of bronchodilators.  Total lung capacity is normal by by the box residual volume is normal residual volume total lung capacity ratio is increased FRC is decreased.  DLCO is mildly decreased and is normal when corrected for alveolar volume  IMPRESSION:   this pulmonary function studies within normal limits There is no change noted post bronchodilator however clinically improvement may still car in the absence of spirometric improvement  Allyne Gee, MD [facility] Pulmonary Critical Care Medicine Sleep Medicine

## 2017-04-14 ENCOUNTER — Ambulatory Visit: Payer: BLUE CROSS/BLUE SHIELD | Attending: Nurse Practitioner | Admitting: Nurse Practitioner

## 2017-04-14 ENCOUNTER — Encounter: Payer: Self-pay | Admitting: Nurse Practitioner

## 2017-04-14 VITALS — BP 160/94 | HR 88 | Temp 97.6°F | Resp 16 | Ht 64.0 in | Wt 185.0 lb

## 2017-04-14 DIAGNOSIS — Z87891 Personal history of nicotine dependence: Secondary | ICD-10-CM | POA: Insufficient documentation

## 2017-04-14 DIAGNOSIS — K589 Irritable bowel syndrome without diarrhea: Secondary | ICD-10-CM | POA: Insufficient documentation

## 2017-04-14 DIAGNOSIS — Z888 Allergy status to other drugs, medicaments and biological substances status: Secondary | ICD-10-CM | POA: Insufficient documentation

## 2017-04-14 DIAGNOSIS — G894 Chronic pain syndrome: Secondary | ICD-10-CM | POA: Diagnosis not present

## 2017-04-14 DIAGNOSIS — M792 Neuralgia and neuritis, unspecified: Secondary | ICD-10-CM

## 2017-04-14 DIAGNOSIS — N83201 Unspecified ovarian cyst, right side: Secondary | ICD-10-CM | POA: Insufficient documentation

## 2017-04-14 DIAGNOSIS — I10 Essential (primary) hypertension: Secondary | ICD-10-CM | POA: Insufficient documentation

## 2017-04-14 DIAGNOSIS — F419 Anxiety disorder, unspecified: Secondary | ICD-10-CM | POA: Insufficient documentation

## 2017-04-14 DIAGNOSIS — M25552 Pain in left hip: Secondary | ICD-10-CM | POA: Insufficient documentation

## 2017-04-14 DIAGNOSIS — E7800 Pure hypercholesterolemia, unspecified: Secondary | ICD-10-CM | POA: Insufficient documentation

## 2017-04-14 DIAGNOSIS — M4726 Other spondylosis with radiculopathy, lumbar region: Secondary | ICD-10-CM | POA: Diagnosis not present

## 2017-04-14 DIAGNOSIS — Z9889 Other specified postprocedural states: Secondary | ICD-10-CM | POA: Insufficient documentation

## 2017-04-14 DIAGNOSIS — M47816 Spondylosis without myelopathy or radiculopathy, lumbar region: Secondary | ICD-10-CM | POA: Diagnosis not present

## 2017-04-14 DIAGNOSIS — E559 Vitamin D deficiency, unspecified: Secondary | ICD-10-CM | POA: Insufficient documentation

## 2017-04-14 DIAGNOSIS — G8929 Other chronic pain: Secondary | ICD-10-CM

## 2017-04-14 DIAGNOSIS — M7918 Myalgia, other site: Secondary | ICD-10-CM | POA: Diagnosis not present

## 2017-04-14 DIAGNOSIS — Z7984 Long term (current) use of oral hypoglycemic drugs: Secondary | ICD-10-CM | POA: Diagnosis not present

## 2017-04-14 DIAGNOSIS — F329 Major depressive disorder, single episode, unspecified: Secondary | ICD-10-CM | POA: Insufficient documentation

## 2017-04-14 DIAGNOSIS — Z79891 Long term (current) use of opiate analgesic: Secondary | ICD-10-CM | POA: Diagnosis not present

## 2017-04-14 DIAGNOSIS — M25551 Pain in right hip: Secondary | ICD-10-CM

## 2017-04-14 DIAGNOSIS — E119 Type 2 diabetes mellitus without complications: Secondary | ICD-10-CM | POA: Diagnosis not present

## 2017-04-14 DIAGNOSIS — Z8249 Family history of ischemic heart disease and other diseases of the circulatory system: Secondary | ICD-10-CM | POA: Insufficient documentation

## 2017-04-14 DIAGNOSIS — Z885 Allergy status to narcotic agent status: Secondary | ICD-10-CM | POA: Insufficient documentation

## 2017-04-14 DIAGNOSIS — G8918 Other acute postprocedural pain: Secondary | ICD-10-CM | POA: Diagnosis not present

## 2017-04-14 DIAGNOSIS — E669 Obesity, unspecified: Secondary | ICD-10-CM | POA: Insufficient documentation

## 2017-04-14 DIAGNOSIS — Z880 Allergy status to penicillin: Secondary | ICD-10-CM | POA: Diagnosis not present

## 2017-04-14 DIAGNOSIS — Z7982 Long term (current) use of aspirin: Secondary | ICD-10-CM | POA: Insufficient documentation

## 2017-04-14 DIAGNOSIS — R531 Weakness: Secondary | ICD-10-CM | POA: Diagnosis not present

## 2017-04-14 DIAGNOSIS — Z79899 Other long term (current) drug therapy: Secondary | ICD-10-CM | POA: Insufficient documentation

## 2017-04-14 DIAGNOSIS — K219 Gastro-esophageal reflux disease without esophagitis: Secondary | ICD-10-CM | POA: Diagnosis not present

## 2017-04-14 DIAGNOSIS — Z90710 Acquired absence of both cervix and uterus: Secondary | ICD-10-CM | POA: Insufficient documentation

## 2017-04-14 DIAGNOSIS — Z9049 Acquired absence of other specified parts of digestive tract: Secondary | ICD-10-CM | POA: Insufficient documentation

## 2017-04-14 MED ORDER — METAXALONE 800 MG PO TABS: 800.0000 mg | ORAL_TABLET | Freq: Three times a day (TID) | ORAL | 0 refills | Status: DC

## 2017-04-14 MED ORDER — GABAPENTIN 300 MG PO CAPS: 300.0000 mg | ORAL_CAPSULE | Freq: Three times a day (TID) | ORAL | 0 refills | Status: DC

## 2017-04-14 MED ORDER — OXYCODONE HCL 5 MG PO TABS: 5.0000 mg | ORAL_TABLET | Freq: Every day | ORAL | 0 refills | Status: AC | PRN

## 2017-04-14 NOTE — Progress Notes (Signed)
Nursing Pain Medication Assessment:  Safety precautions to be maintained throughout the outpatient stay will include: orient to surroundings, keep bed in low position, maintain call bell within reach at all times, provide assistance with transfer out of bed and ambulation.  Medication Inspection Compliance: Pill count conducted under aseptic conditions, in front of the patient. Neither the pills nor the bottle was removed from the patient's sight at any time. Once count was completed pills were immediately returned to the patient in their original bottle.  Medication: Oxycodone IR Pill/Patch Count: 0 of 150 pills remain Pill/Patch Appearance: Markings consistent with prescribed medication Bottle Appearance: Standard pharmacy container. Clearly labeled. Filled Date: 02 / 15 / 2019 Last Medication intake:  last dose on Thursday last week.

## 2017-04-14 NOTE — Progress Notes (Signed)
Patient's Name: Kelly Rollins  MRN: [id number]  Referring Provider: Leonel Ramsay, MD  DOB: 1970/03/26  PCP: Leonel Ramsay, MD  DOS: 04/14/2017  Note by: Vevelyn Francois NP  Service setting: Ambulatory outpatient  Specialty: Interventional Pain Management  Location: [facility] (AMB) Pain Management Facility    Patient type: Established    Primary Reason(s) for Visit: Encounter for prescription drug management. (Level of risk: moderate)  CC: Back Pain (lower bilateral) and Hip Pain (bilateral, right is worse)  HPI  Kelly Rollins is a 47 y.o. year old, female patient, who comes today for a medication management evaluation. She has TOBACCO ABUSE; GERD; Constipation; Pure hypercholesterolemia; Hyperlipidemia; Fatty liver disease; Anxiety; Obesity; Essential hypertriglyceridemia; Long term current use of opiate analgesic; Long term prescription opiate use; Opiate use (25 MME/Day); Encounter for therapeutic drug level monitoring; Encounter for pain management planning; Chronic low back pain (Location of Primary Source of Pain) (Bilateral) (L>R); Lumbar facet syndrome (Location of Primary Source of Pain) (Bilateral) (L>R); Chronic hip pain (Location of Secondary source of pain) (Bilateral) (L>R); Chronic knee pain (Location of Tertiary source of pain) (Bilateral) (L>R); Lumbar spondylosis; Neuropathic pain; Neurogenic pain; Myofascial pain; Acute low back pain; Lumbar transverse process fracture (HCC) (Left L1) (03/11/15); Chronic pain syndrome; Depression; Diabetes mellitus type 2, uncomplicated ([place]); Fatty liver; HTN (hypertension), benign; Vitamin D insufficiency; Acute postoperative pain; and Lumbar radiculopathy on their problem list. Her primarily concern today is the Back Pain (lower bilateral) and Hip Pain (bilateral, right is worse)  Pain Assessment: Location: Medial, Left, Right(right hip) Back Radiating: into right hip and down right leg causing weakness  Onset: More than a month  ago Duration: Chronic pain Quality: Discomfort, Constant, Numbness Severity: 3 /10 (self-reported pain score)  Note: Reported level is compatible with observation.                          Effect on ADL: limits what she does.  gait affected by pain and numbness in leg.  girls assist her with household duties and errands.  Timing: Constant Modifying factors: pain medication,  feels that she needs more at times.  rest  Ms. Caffey was last scheduled for an appointment on 01/18/2017 for medication management. During today's appointment we reviewed Kelly Rollins's chronic pain status, as well as her outpatient medication regimen. She admits that she has continued to use overuse her medication. She feels like she is running our since her last procedure. She admits that she fell down the stairs. She states that her right leg went out from under her. She admits it is using the gabapentin 375m at night only. She admits that she can not tolerate it during the day. She admits that the benadryl was effective with helping her rest at night. She admits that she is also using OTC salonpas. She is frighten to have another procedure however at a previous visit she was ready to try it at again. She states that she is concern about her medication use. She admits that she was on a muscle relaxer in the past and she felt like this may help her some. .  The patient  reports that she does not use drugs. Her body mass index is 31.76 kg/m.  Further details on both, my assessment(s), as well as the proposed treatment plan, please see below.  Controlled Substance Pharmacotherapy Assessment REMS (Risk Evaluation and Mitigation Strategy)  Analgesic:Oxycodone IRone tablet 5 times a day (25 mg/day) MME/day:37.568mday .  Kelly Billow, RN  04/14/2017  9:19 AM  Sign at close encounter Nursing Pain Medication Assessment:  Safety precautions to be maintained throughout the outpatient stay will include: orient to  surroundings, keep bed in low position, maintain call bell within reach at all times, provide assistance with transfer out of bed and ambulation.  Medication Inspection Compliance: Pill count conducted under aseptic conditions, in front of the patient. Neither the pills nor the bottle was removed from the patient's sight at any time. Once count was completed pills were immediately returned to the patient in their original bottle.  Medication: Oxycodone IR Pill/Patch Count: 0 of 150 pills remain Pill/Patch Appearance: Markings consistent with prescribed medication Bottle Appearance: Standard pharmacy container. Clearly labeled. Filled Date: 02 / 15 / 2019 Last Medication intake:  last dose on Thursday last week.    Pharmacokinetics: Liberation and absorption (onset of action): WNL Distribution (time to peak effect): WNL Metabolism and excretion (duration of action): WNL         Pharmacodynamics: Desired effects: Analgesia: Kelly Rollins reports >50% benefit. Functional ability: Patient reports that medication allows her to accomplish basic ADLs Clinically meaningful improvement in function (CMIF): Sustained CMIF goals met Perceived effectiveness: Described as relatively effective, allowing for increase in activities of daily living (ADL) Undesirable effects: Side-effects or Adverse reactions: None reported Monitoring: [place] PMP: Online review of the past 72-monthperiod conducted. Compliant with practice rules and regulations Last UDS on record: Summary  Date Value Ref Range Status  01/18/2017 FINAL  Final    Comment:    ==================================================================== TOXASSURE SELECT 13 (MW) ==================================================================== Test                             Result       Flag       Units Drug Absent but Declared for Prescription Verification   Oxycodone                      Not Detected UNEXPECTED ng/mg  creat ==================================================================== Test                      Result    Flag   Units      Ref Range   Creatinine              209              mg/dL      >=20 ==================================================================== Declared Medications:  The flagging and interpretation on this report are based on the  following declared medications.  Unexpected results may arise from  inaccuracies in the declared medications.  **Note: The testing scope of this panel includes these medications:  Oxycodone  **Note: The testing scope of this panel does not include following  reported medications:  Acetaminophen  Albuterol  Aspirin  Caffeine  Gabapentin  Ibuprofen  Magnesium  Metformin  Metoprolol (Toprol)  Multivitamin (MVI)  Ondansetron (Zofran)  Venlafaxine (Effexor)  Vitamin C  Vitamin D3 ==================================================================== For clinical consultation, please call [phone] ====================================================================    UDS interpretation: Non-Compliant The patient was given a final warning about the accuracy of reporting medications. Medication Assessment Form: Reviewed. Patient indicates being compliant with therapy Treatment compliance: Deficiencies noted and steps taken to remind the patient of the seriousness of adequate therapy compliance Risk Assessment Profile: Aberrant behavior: non-adherence to medication regimen, taking more medication than prescribed and unsafe use of medication Comorbid  factors increasing risk of overdose: age 4-66 years old and caucasian Risk of substance use disorder (SUD): Moderate-to-High Opioid Risk Tool - 04/14/17 0916      Family History of Substance Abuse   Alcohol  Negative    Illegal Drugs  Negative    Rx Drugs  Negative      Personal History of Substance Abuse   Alcohol  Negative    Illegal Drugs  Negative    Rx Drugs  Negative       Psychological Disease   Psychological Disease  Negative    Depression  Negative      Total Score   Opioid Risk Tool Scoring  0    Opioid Risk Interpretation  Low Risk      ORT Scoring interpretation table:  Score <3 = Low Risk for SUD  Score between 4-7 = Moderate Risk for SUD  Score >8 = High Risk for Opioid Abuse   Risk Mitigation Strategies:  Patient Counseling: Covered Patient-Prescriber Agreement (PPA): Present and active  Notification to other healthcare providers: Done  Pharmacologic Plan: Therapy adjustment:           Mrs Donaghey was informed that she will return to a monthly follow ups for now.   Laboratory Chemistry  Inflammation Markers (CRP: Acute Phase) (ESR: Chronic Phase) Lab Results  Component Value Date   CRP 2.5 (H) 04/28/2016   ESRSEDRATE 40 (H) 04/28/2016   LATICACIDVEN 2.0 (HH) 02/03/2017                         Rheumatology Markers No results found for: Elayne Guerin, [facility]              Renal Function Markers Lab Results  Component Value Date   BUN 14 02/03/2017   CREATININE 0.76 02/03/2017   GFRAA >60 02/03/2017   GFRNONAA >60 02/03/2017                 Hepatic Function Markers Lab Results  Component Value Date   AST 31 02/03/2017   ALT 16 02/03/2017   ALBUMIN 3.3 (L) 02/03/2017   ALKPHOS 95 02/03/2017   AMYLASE 40 04/03/2010   LIPASE 25.0 04/03/2010                 Electrolytes Lab Results  Component Value Date   NA 137 02/03/2017   K 4.1 02/03/2017   CL 102 02/03/2017   CALCIUM 8.9 02/03/2017   MG 2.1 04/28/2016                        Neuropathy Markers Lab Results  Component Value Date   VITAMINB12 638 04/28/2016                 Bone Pathology Markers Lab Results  Component Value Date   25OHVITD1 27 (L) 04/28/2016   25OHVITD2 3.7 04/28/2016   25OHVITD3 23 04/28/2016                         Coagulation Parameters Lab Results  Component Value Date   PLT 336 02/03/2017                  Cardiovascular Markers Lab Results  Component Value Date   CKTOTAL 109 03/18/2013   CKMB 1.7 03/18/2013   TROPONINI <0.03 02/03/2017   HGB 12.1 02/03/2017   HCT 36.1 02/03/2017  CA Markers No results found for: CEA, CA125, LABCA2               Note: Lab results reviewed.  Recent Diagnostic Imaging Results  DG Chest 2 View CLINICAL DATA:  Acute onset of right eye blurriness.  EXAM: CHEST  2 VIEW  COMPARISON:  08/24/2016  FINDINGS: The cardiac silhouette, mediastinal and hilar contours are within normal limits and stable. Mild eventration of the right hemidiaphragm with overlying atelectasis but no infiltrates or effusions. No pulmonary lesions. The bony thorax is intact.  IMPRESSION: No acute cardiopulmonary findings.  Electronically Signed   By: Marijo Sanes M.D.   On: 02/03/2017 15:46  Complexity Note: Imaging results reviewed. Results shared with Ms. Peloso, using Layman's terms.                         Meds   Current Outpatient Medications:  .  albuterol (PROVENTIL HFA;VENTOLIN HFA) 108 (90 Base) MCG/ACT inhaler, Inhale 2 puffs into the lungs every 6 (six) hours as needed for wheezing or shortness of breath., Disp: 1 Inhaler, Rfl: 0 .  Ascorbic Acid (VITAMIN C) 100 MG tablet, Take 100 mg by mouth daily., Disp: , Rfl:  .  Aspirin-Acetaminophen-Caffeine (EXCEDRIN EXTRA STRENGTH PO), Take 2 tablets by mouth as needed., Disp: , Rfl:  .  budesonide-formoterol (SYMBICORT) 80-4.5 MCG/ACT inhaler, Inhale into the lungs., Disp: , Rfl:  .  gabapentin (NEURONTIN) 300 MG capsule, Take 1 capsule (300 mg total) by mouth every 8 (eight) hours., Disp: 90 capsule, Rfl: 0 .  metFORMIN (GLUCOPHAGE) 500 MG tablet, Take 500 mg by mouth 2 (two) times daily., Disp: , Rfl: 0 .  metoprolol succinate (TOPROL-XL) 100 MG 24 hr tablet, Take 100 mg by mouth daily. , Disp: , Rfl:  .  Multiple Vitamin (MULTIVITAMIN) tablet, Take 1 tablet by mouth daily., Disp: , Rfl:  .   naproxen sodium (ALEVE) 220 MG tablet, Take 220 mg by mouth 2 (two) times daily as needed., Disp: , Rfl:  .  [START ON 04/18/2017] oxyCODONE (OXY IR/ROXICODONE) 5 MG immediate release tablet, Take 1 tablet (5 mg total) by mouth 5 (five) times daily as needed for severe pain., Disp: 150 tablet, Rfl: 0 .  venlafaxine (EFFEXOR) 25 MG tablet, Take 37.5 mg by mouth daily. , Disp: , Rfl:  .  albuterol (PROAIR HFA) 108 (90 Base) MCG/ACT inhaler, Inhale 2 puffs into the lungs every 6 (six) hours as needed. , Disp: , Rfl:  .  metaxalone (SKELAXIN) 800 MG tablet, Take 1 tablet (800 mg total) by mouth 3 (three) times daily., Disp: 90 tablet, Rfl: 0 .  metFORMIN (GLUCOPHAGE) 500 MG tablet, Take 500 mg by mouth 2 (two) times daily with a meal. , Disp: , Rfl:   ROS  Constitutional: Denies any fever or chills Gastrointestinal: No reported hemesis, hematochezia, vomiting, or acute GI distress Musculoskeletal: Denies any acute onset joint swelling, redness, loss of ROM, or weakness Neurological: No reported episodes of acute onset apraxia, aphasia, dysarthria, agnosia, amnesia, paralysis, loss of coordination, or loss of consciousness  Allergies  Ms. Duley is allergic to levofloxacin; morphine; penicillins; and tramadol hcl.  [place]  Drug: Ms. Mcgovern  reports that she does not use drugs. Alcohol:  reports that she drinks alcohol. Tobacco:  reports that she quit smoking about 2 years ago. Her smoking use included cigarettes. she has never used smokeless tobacco. Medical:  has a past medical history of ABDOMINAL PAIN OTHER SPECIFIED SITE (  02/21/2009), ABDOMINAL PAIN-LUQ (04/17/2009), Anal fissure, Diabetes mellitus without complication ([place]), ALPFXTKW(409.7), HEMATOCHEZIA (04/17/2009), Hypertension, IBS (irritable bowel syndrome), Migraine headache (11/06/2008), OVARIAN CYST, RIGHT (02/21/2009), and PALPITATIONS, OCCASIONAL (04/17/2010). Surgical: Ms. Trauger  has a past surgical history that includes Cesarean section;  Vaginal hysterectomy; Cholecystectomy; and Breast reduction surgery. Family: family history includes Cancer in her mother; Heart disease in her father; Hyperlipidemia in her father and mother; Hypertension in her father and mother.  Constitutional Exam  General appearance: Well nourished, well developed, and well hydrated. In no apparent acute distress Vitals:   04/14/17 0904  BP: (!) 160/94  Pulse: 88  Resp: 16  Temp: 97.6 F (36.4 C)  TempSrc: Oral  SpO2: 99%  Weight: 185 lb (83.9 kg)  Height: 5' 4" (1.626 m)   BMI Assessment: Estimated body mass index is 31.76 kg/m as calculated from the following:   Height as of this encounter: 5' 4" (1.626 m).   Weight as of this encounter: 185 lb (83.9 kg). Psych/Mental status: Alert, oriented x 3 (person, place, & time)       Eyes: PERLA Respiratory: No evidence of acute respiratory distress   Lumbar Spine Area Exam  Skin & Axial Inspection: No masses, redness, or swelling Alignment: Symmetrical Functional ROM: Unrestricted ROM      Stability: No instability detected Muscle Tone/Strength: Functionally intact. No obvious neuro-muscular anomalies detected. Sensory (Neurological): Unimpaired Palpation: Complains of area being tender to palpation       Provocative Tests: Lumbar Hyperextension and rotation test: Positive bilaterally for facet joint pain. Lumbar Lateral bending test: evaluation deferred today       Patrick's Maneuver: evaluation deferred today                    Gait & Posture Assessment  Ambulation: Unassisted Gait: Relatively normal for age and body habitus Posture: WNL   Lower Extremity Exam    Side: Right lower extremity  Side: Left lower extremity  Skin & Extremity Inspection: Skin color, temperature, and hair growth are WNL. No peripheral edema or cyanosis. No masses, redness, swelling, asymmetry, or associated skin lesions. No contractures.  Skin & Extremity Inspection: Skin color, temperature, and hair growth  are WNL. No peripheral edema or cyanosis. No masses, redness, swelling, asymmetry, or associated skin lesions. No contractures.  Functional ROM: Unrestricted ROM          Functional ROM: Unrestricted ROM          Muscle Tone/Strength: Functionally intact. No obvious neuro-muscular anomalies detected.  Muscle Tone/Strength: Functionally intact. No obvious neuro-muscular anomalies detected.  Sensory (Neurological): Unimpaired  Sensory (Neurological): Unimpaired  Palpation: No palpable anomalies  Palpation: No palpable anomalies   Assessment  Primary Diagnosis & Pertinent Problem List: The primary encounter diagnosis was Lumbar facet syndrome (Location of Primary Source of Pain) (Bilateral) (L>R). Diagnoses of Lumbar spondylosis, Chronic pain of both hips, Chronic pain, and Neurogenic pain were also pertinent to this visit.  Status Diagnosis  Controlled Controlled Controlled 1. Lumbar facet syndrome (Location of Primary Source of Pain) (Bilateral) (L>R)   2. Lumbar spondylosis   3. Chronic pain of both hips   4. Chronic pain   5. Neurogenic pain     Problems updated and reviewed during this visit: No problems updated. Plan of Care  Pharmacotherapy (Medications Ordered): Meds ordered this encounter  Medications  . metaxalone (SKELAXIN) 800 MG tablet    Sig: Take 1 tablet (800 mg total) by mouth 3 (three) times daily.  Dispense:  90 tablet    Refill:  0    Do not place this medication, or any other prescription from our practice, on "Automatic Refill". Patient may have prescription filled one day early if pharmacy is closed on scheduled refill date.    Order Specific Question:   Supervising Provider    Answer:   Milinda Pointer [phone]  . oxyCODONE (OXY IR/ROXICODONE) 5 MG immediate release tablet    Sig: Take 1 tablet (5 mg total) by mouth 5 (five) times daily as needed for severe pain.    Dispense:  150 tablet    Refill:  0    Do not place this medication, or any other  prescription from our practice, on "Automatic Refill". Patient may have prescription filled one day early if pharmacy is closed on scheduled refill date. Do not fill until:04/18/2017 To last until: 05/18/2017    Order Specific Question:   Supervising Provider    Answer:   Milinda Pointer [phone]  . gabapentin (NEURONTIN) 300 MG capsule    Sig: Take 1 capsule (300 mg total) by mouth every 8 (eight) hours.    Dispense:  90 capsule    Refill:  0    Do not place this medication, or any other prescription from our practice, on "Automatic Refill". Patient may have prescription filled one day early if pharmacy is closed on scheduled refill date.    Order Specific Question:   Supervising Provider    Answer:   Milinda Pointer [phone]   New Prescriptions   METAXALONE (SKELAXIN) 800 MG TABLET    Take 1 tablet (800 mg total) by mouth 3 (three) times daily.   Medications administered today: Courtney Heys had no medications administered during this visit. Lab-work, procedure(s), and/or referral(s): No orders of the defined types were placed in this encounter.  Imaging and/or referral(s): None  Interventional therapies: Planned, scheduled, and/or pending:  Not at this time   Considering: Diagnostic bilateral hip joint injection Palliative bilateral lumbar facet block  Bilateral lumbar facet radiofrequency ablation.   Palliative PRN treatment(s): Diagnostic bilateral hip joint injection  Palliative Bilateral lumbar facet block plus left L1 transverse process injection      Provider-requested follow-up: Return in about 4 weeks (around 05/12/2017) for MedMgmt with Me Donella Stade [facility]ace).  Future Appointments  Date Time Provider [place]  05/11/2017  8:30 AM Vevelyn Francois, NP [facility] None   Primary Care Physician: Leonel Ramsay, MD Location: [facility] Outpatient [facility] Management Facility Note by: Vevelyn Francois NP Date: 04/14/2017; Time: 10:34 AM  Pain Score  Disclaimer: We use the NRS-11 scale. This is a self-reported, subjective measurement of pain severity with only modest accuracy. It is used primarily to identify changes within a particular patient. It must be understood that outpatient pain scales are significantly less accurate that those used for research, where they can be applied under ideal controlled circumstances with minimal exposure to variables. In reality, the score is likely to be a combination of pain intensity and pain affect, where pain affect describes the degree of emotional arousal or changes in action readiness caused by the sensory experience of pain. Factors such as social and work situation, setting, emotional state, anxiety levels, expectation, and prior pain experience may influence pain perception and show large inter-individual differences that may also be affected by time variables.  Patient instructions provided during this appointment: Patient Instructions   You have received Rx for Oxycodone to last until 05/18/2017 Rx for gabapentin and skelaxin  have been escribed to pharmacy  ___________________________________________________________________________________________  Medication Rules  Applies to: All patients receiving prescriptions (written or electronic).  Pharmacy of record: Pharmacy where electronic prescriptions will be sent. If written prescriptions are taken to a different pharmacy, please inform the nursing staff. The pharmacy listed in the electronic medical record should be the one where you would like electronic prescriptions to be sent.  Prescription refills: Only during scheduled appointments. Applies to both, written and electronic prescriptions.  NOTE: The following applies primarily to controlled substances (Opioid* Pain Medications).   Patient's responsibilities: 1. Pain Pills: Bring all pain pills to every appointment (except for procedure appointments). 2. Pill Bottles: Bring pills in original  pharmacy bottle. Always bring newest bottle. Bring bottle, even if empty. 3. Medication refills: You are responsible for knowing and keeping track of what medications you need refilled. The day before your appointment, write a list of all prescriptions that need to be refilled. Bring that list to your appointment and give it to the admitting nurse. Prescriptions will be written only during appointments. If you forget a medication, it will not be "Called in", "Faxed", or "electronically sent". You will need to get another appointment to get these prescribed. 4. Prescription Accuracy: You are responsible for carefully inspecting your prescriptions before leaving our office. Have the discharge nurse carefully go over each prescription with you, before taking them home. Make sure that your name is accurately spelled, that your address is correct. Check the name and dose of your medication to make sure it is accurate. Check the number of pills, and the written instructions to make sure they are clear and accurate. Make sure that you are given enough medication to last until your next medication refill appointment. 5. Taking Medication: Take medication as prescribed. Never take more pills than instructed. Never take medication more frequently than prescribed. Taking less pills or less frequently is permitted and encouraged, when it comes to controlled substances (written prescriptions).  6. Inform other Doctors: Always inform, all of your healthcare providers, of all the medications you take. 7. Pain Medication from other Providers: You are not allowed to accept any additional pain medication from any other Doctor or Healthcare provider. There are two exceptions to this rule. (see below) In the event that you require additional pain medication, you are responsible for notifying us, as stated below. 8. Medication Agreement: You are responsible for carefully reading and following our Medication Agreement. This must be  signed before receiving any prescriptions from our practice. Safely store a copy of your signed Agreement. Violations to the Agreement will result in no further prescriptions. (Additional copies of our Medication Agreement are available upon request.) 9. Laws, Rules, & Regulations: All patients are expected to follow all Federal and [employer], Rules, [employer]. Ignorance of the Laws does not constitute a valid excuse. The use of any illegal substances is prohibited. 10. Adopted CDC guidelines & recommendations: Target dosing levels will be at or below 60 MME/day. Use of benzodiazepines** is not recommended.  Exceptions: There are only two exceptions to the rule of not receiving pain medications from other Healthcare Providers. 1. Exception #1 (Emergencies): In the event of an emergency (i.e.: accident requiring emergency care), you are allowed to receive additional pain medication. However, you are responsible for: As soon as you are able, call our office [phone], at any time of the day or night, and leave a message stating your name, the date and nature of the emergency, and the  name and dose of the medication prescribed. In the event that your call is answered by a member of our staff, make sure to document and save the date, time, and the name of the person that took your information.  2. Exception #2 (Planned Surgery): In the event that you are scheduled by another doctor or dentist to have any type of surgery or procedure, you are allowed (for a period no longer than 30 days), to receive additional pain medication, for the acute post-op pain. However, in this case, you are responsible for picking up a copy of our "Post-op Pain Management for Surgeons" handout, and giving it to your surgeon or dentist. This document is available at our office, and does not require an appointment to obtain it. Simply go to our office during business hours (Monday-Thursday from 8:00 AM to 4:00 PM)  (Friday 8:00 AM to 12:00 Noon) or if you have a scheduled appointment with [place], prior to your surgery, and ask for it by name. In addition, you will need to provide [place] with your name, name of your surgeon, type of surgery, and date of procedure or surgery.  *Opioid medications include: morphine, codeine, oxycodone, oxymorphone, hydrocodone, hydromorphone, meperidine, tramadol, tapentadol, buprenorphine, fentanyl, methadone. **Benzodiazepine medications include: diazepam (Valium), alprazolam (Xanax), clonazepam (Klonopine), lorazepam (Ativan), clorazepate (Tranxene), chlordiazepoxide (Librium), estazolam (Prosom), oxazepam (Serax), temazepam (Restoril), triazolam (Halcion) (Last updated: 04/01/2017) ____________________________________________________________________________________________   BMI interpretation table: BMI level Category Range association with higher incidence of chronic pain  <18 kg/m2 Underweight   18.5-24.9 kg/m2 Ideal body weight   25-29.9 kg/m2 Overweight Increased incidence by 20%  30-34.9 kg/m2 Obese (Class I) Increased incidence by 68%  35-39.9 kg/m2 Severe obesity (Class II) Increased incidence by 136%  >40 kg/m2 Extreme obesity (Class III) Increased incidence by 254%   BMI Readings from Last 4 Encounters:  04/14/17 31.76 kg/m  02/22/17 32.06 kg/m  02/03/17 35.52 kg/m  01/18/17 31.93 kg/m   Wt Readings from Last 4 Encounters:  04/14/17 185 lb (83.9 kg)  02/22/17 186 lb 12.8 oz (84.7 kg)  02/03/17 188 lb (85.3 kg)  01/18/17 186 lb (84.4 kg)

## 2017-04-14 NOTE — Patient Instructions (Addendum)
You have received Rx for Oxycodone to last until 05/18/2017 Rx for gabapentin and skelaxin have been escribed to pharmacy  ___________________________________________________________________________________________  Medication Rules  Applies to: All patients receiving prescriptions (written or electronic).  Pharmacy of record: Pharmacy where electronic prescriptions will be sent. If written prescriptions are taken to a different pharmacy, please inform the nursing staff. The pharmacy listed in the electronic medical record should be the one where you would like electronic prescriptions to be sent.  Prescription refills: Only during scheduled appointments. Applies to both, written and electronic prescriptions.  NOTE: The following applies primarily to controlled substances (Opioid* Pain Medications).   Patient's responsibilities: 1. Pain Pills: Bring all pain pills to every appointment (except for procedure appointments). 2. Pill Bottles: Bring pills in original pharmacy bottle. Always bring newest bottle. Bring bottle, even if empty. 3. Medication refills: You are responsible for knowing and keeping track of what medications you need refilled. The day before your appointment, write a list of all prescriptions that need to be refilled. Bring that list to your appointment and give it to the admitting nurse. Prescriptions will be written only during appointments. If you forget a medication, it will not be "Called in", "Faxed", or "electronically sent". You will need to get another appointment to get these prescribed. 4. Prescription Accuracy: You are responsible for carefully inspecting your prescriptions before leaving our office. Have the discharge nurse carefully go over each prescription with you, before taking them home. Make sure that your name is accurately spelled, that your address is correct. Check the name and dose of your medication to make sure it is accurate. Check the number of pills,  and the written instructions to make sure they are clear and accurate. Make sure that you are given enough medication to last until your next medication refill appointment. 5. Taking Medication: Take medication as prescribed. Never take more pills than instructed. Never take medication more frequently than prescribed. Taking less pills or less frequently is permitted and encouraged, when it comes to controlled substances (written prescriptions).  6. Inform other Doctors: Always inform, all of your healthcare providers, of all the medications you take. 7. Pain Medication from other Providers: You are not allowed to accept any additional pain medication from any other Doctor or Healthcare provider. There are two exceptions to this rule. (see below) In the event that you require additional pain medication, you are responsible for notifying us, as stated below. 8. Medication Agreement: You are responsible for carefully reading and following our Medication Agreement. This must be signed before receiving any prescriptions from our practice. Safely store a copy of your signed Agreement. Violations to the Agreement will result in no further prescriptions. (Additional copies of our Medication Agreement are available upon request.) 9. Laws, Rules, & Regulations: All patients are expected to follow all Federal and [employer], [employer], Rules, [employer]. Ignorance of the Laws does not constitute a valid excuse. The use of any illegal substances is prohibited. 10. Adopted CDC guidelines & recommendations: Target dosing levels will be at or below 60 MME/day. Use of benzodiazepines** is not recommended.  Exceptions: There are only two exceptions to the rule of not receiving pain medications from other Healthcare Providers. 1. Exception #1 (Emergencies): In the event of an emergency (i.e.: accident requiring emergency care), you are allowed to receive additional pain medication. However, you are responsible for: As  soon as you are able, call our office [phone], at any time of the day or night, and  leave a message stating your name, the date and nature of the emergency, and the name and dose of the medication prescribed. In the event that your call is answered by a member of our staff, make sure to document and save the date, time, and the name of the person that took your information.  2. Exception #2 (Planned Surgery): In the event that you are scheduled by another doctor or dentist to have any type of surgery or procedure, you are allowed (for a period no longer than 30 days), to receive additional pain medication, for the acute post-op pain. However, in this case, you are responsible for picking up a copy of our "Post-op Pain Management for Surgeons" handout, and giving it to your surgeon or dentist. This document is available at our office, and does not require an appointment to obtain it. Simply go to our office during business hours (Monday-Thursday from 8:00 AM to 4:00 PM) (Friday 8:00 AM to 12:00 Noon) or if you have a scheduled appointment with [place], prior to your surgery, and ask for it by name. In addition, you will need to provide [place] with your name, name of your surgeon, type of surgery, and date of procedure or surgery.  *Opioid medications include: morphine, codeine, oxycodone, oxymorphone, hydrocodone, hydromorphone, meperidine, tramadol, tapentadol, buprenorphine, fentanyl, methadone. **Benzodiazepine medications include: diazepam (Valium), alprazolam (Xanax), clonazepam (Klonopine), lorazepam (Ativan), clorazepate (Tranxene), chlordiazepoxide (Librium), estazolam (Prosom), oxazepam (Serax), temazepam (Restoril), triazolam (Halcion) (Last updated: 04/01/2017) ____________________________________________________________________________________________   BMI interpretation table: BMI level Category Range association with higher incidence of chronic pain  <18 kg/m2 Underweight   18.5-24.9 kg/m2  Ideal body weight   25-29.9 kg/m2 Overweight Increased incidence by 20%  30-34.9 kg/m2 Obese (Class I) Increased incidence by 68%  35-39.9 kg/m2 Severe obesity (Class II) Increased incidence by 136%  >40 kg/m2 Extreme obesity (Class III) Increased incidence by 254%   BMI Readings from Last 4 Encounters:  04/14/17 31.76 kg/m  02/22/17 32.06 kg/m  02/03/17 35.52 kg/m  01/18/17 31.93 kg/m   Wt Readings from Last 4 Encounters:  04/14/17 185 lb (83.9 kg)  02/22/17 186 lb 12.8 oz (84.7 kg)  02/03/17 188 lb (85.3 kg)  01/18/17 186 lb (84.4 kg)

## 2017-04-21 DIAGNOSIS — R002 Palpitations: Secondary | ICD-10-CM | POA: Insufficient documentation

## 2017-04-21 DIAGNOSIS — R079 Chest pain, unspecified: Secondary | ICD-10-CM | POA: Insufficient documentation

## 2017-04-21 DIAGNOSIS — R0609 Other forms of dyspnea: Secondary | ICD-10-CM

## 2017-04-22 NOTE — Procedures (Signed)
[facility] [address] [place], [place] [address]  Patient Name: Kelly Rollins DOB: Jul 16, 1970   SLEEP STUDY INTERPRETATION  DATE OF SERVICE:  March 30, 2017   SLEEP STUDY HISTORY: This patient is referred to the sleep lab for a baseline Polysomnography. Pertinent history includes a history of diagnosis of excessive daytime somnolence and snoring.  PROCEDURE: This overnight polysomnogram was performed using the Alice 5 acquisition system using the standard diagnostic protocol as outlined by the AASM. This includes 6 channels of EEG, 2 channelscannels of EOG, chin EMG, bilateral anterior tibialis EMG, nasal/oral thermister, PTAF, chest and abdominal wall movements, ECG and pulse oximetry. Apneas and Hypopneas were scored per AASM definition.  SLEEP ARCHITECHTURE: This is a baseline polysomnograph  study. The total recording time was  373 minutes and the patients total sleep time is noted to be  193 minutes. Sleep onset latency was  75.5 minutes and is  prolonged.  Stage R sleep onset latency was  54.5 minutes. Sleep maintenance efficiency was  75.5% and is  reduced.  Sleep staging expressed as a percentage of total sleep time demonstrated  26.2% N1,  58.5% N2 and  0% N3  sleep. Stage R represents  15.3% of total sleep time. This is  reduced.  There were a total of  36 arousals  for an overall arousal index of  11.2 per hour of sleep. PLMS arousal  Are not noted. Arousals without respiratory events are  noted. This can contribute to sleep architechture disruption.  RESPIRATORY MONITORING:   Patient exhibits no significant obstructive apneas or central apneas. There were  32 obstructive hypopneas and  25 RERAs. The total apnea hypopnea index (apneas and hypopneas per hour of sleep) is  9.9 respiratory events per hour and is  mild.  Respiratory monitoring demonstrated  severe snoring through the night. There are a total of  473 snoring episodes representing  57.5% of  sleep.   Baseline oxygen saturation during wakefulness was  95% and during NREM sleep averaged  95% through the night. Arterial saturation during REM sleep was  94% through the night. There  Was no significant  oxygen desaturation with the respiratory events. Arterial oxygen desaturation occurred of at least 4%  Were not noted with a low saturation of  92%. The study was performed  off oxygen.  CARDIAC MONITORING:   Average heart rate is  58 during sleep with a high of  75 beats per minute. Malignant arrhythmias  Are not noted.    IMPRESSIONS:  --This overnight polysomnogram demonstrates  Presence of obstructive sleep apnea with an overall AHI  9.9 per hour. --The overall AHI was  significantly worse  during Stage R. --There were associated  No significant arterial oxygen desaturations noted  During this study --There  Is no significant PLMS noted in this study. --There is  severe snoring noted throughout the study.    RECOMMENDATIONS:  --CPAP titration study is  Recommended in this case. --Nasal decongestants and antihistamines may be of help for increased upper airways resistance when present. --Weight loss through dietary and lifestyle modification is recommended in the presence of obesity. --A search for and treatment of any underlying cardiopulmonary disease is      recommended in the presence of oxygen desaturations. --Alternative treatment options if the patient is not willing to use CPAP include oral   appliances as well as surgical intervention which may help in the appropriate patient. --Clinical correlation is recommended. Please feel free to  call the office for any further  questions or assistance in the care of this patient.     Allyne Gee, MD [facility] Pulmonary Critical Care Medicine Sleep medicine

## 2017-05-06 ENCOUNTER — Ambulatory Visit: Payer: Self-pay | Admitting: Internal Medicine

## 2017-05-06 ENCOUNTER — Other Ambulatory Visit: Payer: Self-pay

## 2017-05-06 ENCOUNTER — Ambulatory Visit: Payer: BLUE CROSS/BLUE SHIELD | Attending: Nurse Practitioner | Admitting: Nurse Practitioner

## 2017-05-06 ENCOUNTER — Encounter: Payer: Self-pay | Admitting: Nurse Practitioner

## 2017-05-06 VITALS — BP 185/104 | HR 88 | Temp 98.2°F | Resp 16 | Ht 64.0 in | Wt 182.0 lb

## 2017-05-06 DIAGNOSIS — Z789 Other specified health status: Secondary | ICD-10-CM

## 2017-05-06 DIAGNOSIS — F329 Major depressive disorder, single episode, unspecified: Secondary | ICD-10-CM | POA: Insufficient documentation

## 2017-05-06 DIAGNOSIS — M4726 Other spondylosis with radiculopathy, lumbar region: Secondary | ICD-10-CM | POA: Insufficient documentation

## 2017-05-06 DIAGNOSIS — R002 Palpitations: Secondary | ICD-10-CM | POA: Diagnosis not present

## 2017-05-06 DIAGNOSIS — M25552 Pain in left hip: Secondary | ICD-10-CM | POA: Insufficient documentation

## 2017-05-06 DIAGNOSIS — M47816 Spondylosis without myelopathy or radiculopathy, lumbar region: Secondary | ICD-10-CM | POA: Diagnosis not present

## 2017-05-06 DIAGNOSIS — J45909 Unspecified asthma, uncomplicated: Secondary | ICD-10-CM | POA: Diagnosis not present

## 2017-05-06 DIAGNOSIS — G894 Chronic pain syndrome: Secondary | ICD-10-CM | POA: Diagnosis not present

## 2017-05-06 DIAGNOSIS — Z79891 Long term (current) use of opiate analgesic: Secondary | ICD-10-CM | POA: Diagnosis not present

## 2017-05-06 DIAGNOSIS — M7918 Myalgia, other site: Secondary | ICD-10-CM | POA: Insufficient documentation

## 2017-05-06 DIAGNOSIS — F419 Anxiety disorder, unspecified: Secondary | ICD-10-CM | POA: Insufficient documentation

## 2017-05-06 DIAGNOSIS — Z5181 Encounter for therapeutic drug level monitoring: Secondary | ICD-10-CM | POA: Diagnosis not present

## 2017-05-06 DIAGNOSIS — M25562 Pain in left knee: Secondary | ICD-10-CM | POA: Insufficient documentation

## 2017-05-06 DIAGNOSIS — K219 Gastro-esophageal reflux disease without esophagitis: Secondary | ICD-10-CM | POA: Insufficient documentation

## 2017-05-06 DIAGNOSIS — E669 Obesity, unspecified: Secondary | ICD-10-CM | POA: Diagnosis not present

## 2017-05-06 DIAGNOSIS — Z87891 Personal history of nicotine dependence: Secondary | ICD-10-CM | POA: Insufficient documentation

## 2017-05-06 DIAGNOSIS — G8929 Other chronic pain: Secondary | ICD-10-CM

## 2017-05-06 DIAGNOSIS — Z79899 Other long term (current) drug therapy: Secondary | ICD-10-CM

## 2017-05-06 DIAGNOSIS — E119 Type 2 diabetes mellitus without complications: Secondary | ICD-10-CM | POA: Diagnosis not present

## 2017-05-06 DIAGNOSIS — M533 Sacrococcygeal disorders, not elsewhere classified: Secondary | ICD-10-CM | POA: Diagnosis not present

## 2017-05-06 DIAGNOSIS — K589 Irritable bowel syndrome without diarrhea: Secondary | ICD-10-CM | POA: Diagnosis not present

## 2017-05-06 DIAGNOSIS — Z7984 Long term (current) use of oral hypoglycemic drugs: Secondary | ICD-10-CM | POA: Diagnosis not present

## 2017-05-06 DIAGNOSIS — M25551 Pain in right hip: Secondary | ICD-10-CM | POA: Insufficient documentation

## 2017-05-06 DIAGNOSIS — K760 Fatty (change of) liver, not elsewhere classified: Secondary | ICD-10-CM | POA: Insufficient documentation

## 2017-05-06 DIAGNOSIS — I10 Essential (primary) hypertension: Secondary | ICD-10-CM | POA: Insufficient documentation

## 2017-05-06 DIAGNOSIS — Z6831 Body mass index (BMI) 31.0-31.9, adult: Secondary | ICD-10-CM | POA: Insufficient documentation

## 2017-05-06 DIAGNOSIS — Z8249 Family history of ischemic heart disease and other diseases of the circulatory system: Secondary | ICD-10-CM | POA: Insufficient documentation

## 2017-05-06 DIAGNOSIS — M25561 Pain in right knee: Secondary | ICD-10-CM

## 2017-05-06 DIAGNOSIS — Z881 Allergy status to other antibiotic agents status: Secondary | ICD-10-CM | POA: Insufficient documentation

## 2017-05-06 DIAGNOSIS — Z9049 Acquired absence of other specified parts of digestive tract: Secondary | ICD-10-CM | POA: Insufficient documentation

## 2017-05-06 DIAGNOSIS — R079 Chest pain, unspecified: Secondary | ICD-10-CM | POA: Diagnosis not present

## 2017-05-06 DIAGNOSIS — M899 Disorder of bone, unspecified: Secondary | ICD-10-CM

## 2017-05-06 DIAGNOSIS — Z888 Allergy status to other drugs, medicaments and biological substances status: Secondary | ICD-10-CM | POA: Insufficient documentation

## 2017-05-06 DIAGNOSIS — E559 Vitamin D deficiency, unspecified: Secondary | ICD-10-CM | POA: Insufficient documentation

## 2017-05-06 DIAGNOSIS — R609 Edema, unspecified: Secondary | ICD-10-CM | POA: Diagnosis not present

## 2017-05-06 DIAGNOSIS — Z880 Allergy status to penicillin: Secondary | ICD-10-CM | POA: Insufficient documentation

## 2017-05-06 DIAGNOSIS — Z885 Allergy status to narcotic agent status: Secondary | ICD-10-CM | POA: Insufficient documentation

## 2017-05-06 MED ORDER — CYCLOBENZAPRINE HCL 5 MG PO TABS: 5.0000 mg | ORAL_TABLET | Freq: Three times a day (TID) | ORAL | 0 refills | Status: DC | PRN

## 2017-05-06 MED ORDER — KETOROLAC TROMETHAMINE 60 MG/2ML IM SOLN
60.0000 mg | Freq: Once | INTRAMUSCULAR | Status: AC
  Administered 2017-05-06: 60 mg via INTRAMUSCULAR
  Filled 2017-05-06: qty 2

## 2017-05-06 MED ORDER — ORPHENADRINE CITRATE 30 MG/ML IJ SOLN
60.0000 mg | Freq: Once | INTRAMUSCULAR | Status: AC
  Administered 2017-05-06: 60 mg via INTRAMUSCULAR
  Filled 2017-05-06: qty 2

## 2017-05-06 MED ORDER — NAPROXEN 500 MG PO TBEC: 500.0000 mg | DELAYED_RELEASE_TABLET | Freq: Two times a day (BID) | ORAL | 0 refills | Status: DC

## 2017-05-06 NOTE — Patient Instructions (Addendum)
BMI Assessment: Estimated body mass index is 31.24 kg/m as calculated from the following:   Height as of this encounter: 5\' 4"  (1.626 m).   Weight as of this encounter: 182 lb (82.6 kg).  BMI interpretation table: BMI level Category Range association with higher incidence of chronic pain  <18 kg/m2 Underweight   18.5-24.9 kg/m2 Ideal body weight   25-29.9 kg/m2 Overweight Increased incidence by 20%  30-34.9 kg/m2 Obese (Class I) Increased incidence by 68%  35-39.9 kg/m2 Severe obesity (Class II) Increased incidence by 136%  >40 kg/m2 Extreme obesity (Class III) Increased incidence by 254%   BMI Readings from Last 4 Encounters:  05/06/17 31.24 kg/m  04/14/17 31.76 kg/m  02/22/17 32.06 kg/m  02/03/17 35.52 kg/m   Wt Readings from Last 4 Encounters:  05/06/17 182 lb (82.6 kg)  04/14/17 185 lb (83.9 kg)  02/22/17 186 lb 12.8 oz (84.7 kg)  02/03/17 188 lb (85.3 kg)

## 2017-05-06 NOTE — Progress Notes (Signed)
Nursing Pain Medication Assessment:  Safety precautions to be maintained throughout the outpatient stay will include: orient to surroundings, keep bed in low position, maintain call bell within reach at all times, provide assistance with transfer out of bed and ambulation.  Medication Inspection Compliance: Pill count conducted under aseptic conditions, in front of the patient. Neither the pills nor the bottle was removed from the patient's sight at any time. Once count was completed pills were immediately returned to the patient in their original bottle.  Medication: Oxycodone IR Pill/Patch Count: 0 of 150 pills remain Pill/Patch Appearance: Markings consistent with prescribed medication Bottle Appearance: Standard pharmacy container. Clearly labeled. Filled Date: 03 / 17/ 2019 Last Medication intake:  yesterday

## 2017-05-06 NOTE — Progress Notes (Signed)
Patient's Name: Kelly Rollins  MRN: [id number]  Referring Provider: Leonel Ramsay, MD  DOB: 1970/10/18  PCP: Leonel Ramsay, MD  DOS: 05/06/2017  Note by: Vevelyn Francois NP  Service setting: Ambulatory outpatient  Specialty: Interventional Pain Management  Location: [facility] (AMB) Pain Management Facility    Patient type: Established    Primary Reason(s) for Visit: Encounter for prescription drug management. (Level of risk: moderate)  CC: Hip Pain (bilateral); Back Pain; and Knee Pain  HPI  [name] is a 47 y.o. year old, female patient, who comes today for a medication management evaluation. She has TOBACCO ABUSE; GERD; Constipation; Pure hypercholesterolemia; Hyperlipidemia; Fatty liver disease; Anxiety; Obesity; Essential hypertriglyceridemia; Long term current use of opiate analgesic; Long term prescription opiate use; Opiate use (25 MME/Day); Encounter for therapeutic drug level monitoring; Encounter for pain management planning; Chronic low back pain (Location of Primary Source of Pain) (Bilateral) (L>R); Lumbar facet syndrome (Location of Primary Source of Pain) (Bilateral) (L>R); Chronic hip pain (Location of Secondary source of pain) (Bilateral) (L>R); Chronic knee pain (Location of Tertiary source of pain) (Bilateral) (L>R); Lumbar spondylosis; Neuropathic pain; Neurogenic pain; Myofascial pain; Acute low back pain; Lumbar transverse process fracture (HCC) (Left L1) (03/11/15); Chronic pain syndrome; Depression; Diabetes mellitus type 2, uncomplicated ([place]); Fatty liver; HTN (hypertension), benign; Vitamin D insufficiency; Acute postoperative pain; Lumbar radiculopathy; Chest pain with moderate risk of acute coronary syndrome; DOE (dyspnea on exertion); Intermittent palpitations; Pharmacologic therapy; Disorder of skeletal system; and Problems influencing health status on their problem list. Her primarily concern today is the Hip Pain (bilateral); Back Pain; and Knee Pain  Pain  Assessment: Location: Right, Left Hip Radiating: radiates from hips to knees in the entire leg.  Feels like a bone ache.  Onset: More than a month ago Duration: Chronic pain Quality: Aching, Throbbing Severity: 5 /10 (self-reported pain score)  Note: Reported level is compatible with observation.                          Timing: Constant Modifying factors: nothing, pain meds dont work very well anymore.   [name] was last scheduled for an appointment on 04/14/2017 for medication management. During today's appointment we reviewed [name]'s chronic pain status, as well as her outpatient medication regimen. She is in today early for a follow up. She states that she has had a rough 3 weeks. She admits that her knee pain is getting worse. The hip pain is her main pain. She denies any falls or injuries. She admits that she has overtaken her medication by 2 weeks. We had discussed this at length on her last visit. She is aware that this is not appropriate. She admits that she has not taken the Gabapentin. She states that she did not realize that it was sent. She also admits that she did not take the Skelaxin. She admits that this was too expensive.  She admits that she wants an alternative treatment. She has had Lumbar facet injection however this seems to make her pain worse. Her last images of her hip and sacroiliac joint were 2015 . They were negative. She admits that she does try to use the inflammatory factor to monitor her foods. She uses [employer] and [employer] during the day.  The patient  reports that she does not use drugs. Her body mass index is 31.24 kg/m.  Further details on both, my assessment(s), as well as the proposed treatment plan, please  see below.  Controlled Substance Pharmacotherapy Assessment REMS (Risk Evaluation and Mitigation Strategy)  Analgesic:Oxycodone IRone tablet 5 times a day (25 mg/day) MME/day:37.'5mg'$ /day .   [name], RN  05/06/2017  8:51 AM  Signed Nursing  Pain Medication Assessment:  Safety precautions to be maintained throughout the outpatient stay will include: orient to surroundings, keep bed in low position, maintain call bell within reach at all times, provide assistance with transfer out of bed and ambulation.  Medication Inspection Compliance: Pill count conducted under aseptic conditions, in front of the patient. Neither the pills nor the bottle was removed from the patient's sight at any time. Once count was completed pills were immediately returned to the patient in their original bottle.  Medication: Oxycodone IR Pill/Patch Count: 0 of 150 pills remain Pill/Patch Appearance: Markings consistent with prescribed medication Bottle Appearance: Standard pharmacy container. Clearly labeled. Filled Date: 03 / 17/ 2019 Last Medication intake:  yesterday   Pharmacokinetics: Liberation and absorption (onset of action): WNL Distribution (time to peak effect): WNL Metabolism and excretion (duration of action): WNL         Pharmacodynamics: Desired effects: Analgesia: Ms. Splinter reports >50% benefit. Functional ability: Patient reports that medication allows her to accomplish basic ADLs Clinically meaningful improvement in function (CMIF): Sustained CMIF goals met Perceived effectiveness: Described as relatively effective, allowing for increase in activities of daily living (ADL) Undesirable effects: Side-effects or Adverse reactions: None reported Monitoring: [place] PMP: Online review of the past 40-monthperiod conducted. Compliant with practice rules and regulations Last UDS on record: Summary  Date Value Ref Range Status  01/18/2017 FINAL  Final    Comment:    ==================================================================== TOXASSURE SELECT 13 (MW) ==================================================================== Test                             Result       Flag       Units Drug Absent but Declared for Prescription  Verification   Oxycodone                      Not Detected UNEXPECTED ng/mg creat ==================================================================== Test                      Result    Flag   Units      Ref Range   Creatinine              209              mg/dL      >=20 ==================================================================== Declared Medications:  The flagging and interpretation on this report are based on the  following declared medications.  Unexpected results may arise from  inaccuracies in the declared medications.  **Note: The testing scope of this panel includes these medications:  Oxycodone  **Note: The testing scope of this panel does not include following  reported medications:  Acetaminophen  Albuterol  Aspirin  Caffeine  Gabapentin  Ibuprofen  Magnesium  Metformin  Metoprolol (Toprol)  Multivitamin (MVI)  Ondansetron (Zofran)  Venlafaxine (Effexor)  Vitamin C  Vitamin D3 ==================================================================== For clinical consultation, please call [phone] ====================================================================    UDS interpretation: Non-Compliant          Medication Assessment Form: Reviewed. Patient indicates being compliant with therapy Treatment compliance: Non-compliant. Steps taken to remind the patient of the seriousness of adequate therapy compliance Risk Assessment Profile: Aberrant behavior: drug binging, impaired control  over use of medications, prescription misuse and unsanctioned dose escalation Comorbid factors increasing risk of overdose: See prior notes. No additional risks detected today Risk of substance use disorder (SUD): High Opioid Risk Tool - 04/14/17 0916      Family History of Substance Abuse   Alcohol  Negative    Illegal Drugs  Negative    Rx Drugs  Negative      Personal History of Substance Abuse   Alcohol  Negative    Illegal Drugs  Negative    Rx Drugs   Negative      Psychological Disease   Psychological Disease  Negative    Depression  Negative      Total Score   Opioid Risk Tool Scoring  0    Opioid Risk Interpretation  Low Risk      ORT Scoring interpretation table:  Score <3 = Low Risk for SUD  Score between 4-7 = Moderate Risk for SUD  Score >8 = High Risk for Opioid Abuse   Risk Mitigation Strategies:  Patient Counseling: Covered Patient-Prescriber Agreement (PPA): Present and active  Notification to other healthcare providers: Done  Pharmacologic Plan: For safety reasons, the treatment plan will be modified to exclude opioids.             Laboratory Chemistry  Inflammation Markers (CRP: Acute Phase) (ESR: Chronic Phase) Lab Results  Component Value Date   CRP 2.5 (H) 04/28/2016   ESRSEDRATE 40 (H) 04/28/2016   LATICACIDVEN 2.0 (HH) 02/03/2017                         Rheumatology Markers No results found for: Elayne Guerin, [facility]                      Renal Function Markers Lab Results  Component Value Date   BUN 14 02/03/2017   CREATININE 0.76 02/03/2017   GFRAA >60 02/03/2017   GFRNONAA >60 02/03/2017                              Hepatic Function Markers Lab Results  Component Value Date   AST 31 02/03/2017   ALT 16 02/03/2017   ALBUMIN 3.3 (L) 02/03/2017   ALKPHOS 95 02/03/2017   AMYLASE 40 04/03/2010   LIPASE 25.0 04/03/2010                        Electrolytes Lab Results  Component Value Date   NA 137 02/03/2017   K 4.1 02/03/2017   CL 102 02/03/2017   CALCIUM 8.9 02/03/2017   MG 2.1 04/28/2016                        Neuropathy Markers Lab Results  Component Value Date   VITAMINB12 638 04/28/2016                        Bone Pathology Markers Lab Results  Component Value Date   25OHVITD1 27 (L) 04/28/2016   25OHVITD2 3.7 04/28/2016   25OHVITD3 23 04/28/2016                         Coagulation Parameters Lab Results  Component Value Date   PLT  336 02/03/2017  Cardiovascular Markers Lab Results  Component Value Date   CKTOTAL 109 03/18/2013   CKMB 1.7 03/18/2013   TROPONINI <0.03 02/03/2017   HGB 12.1 02/03/2017   HCT 36.1 02/03/2017                         CA Markers No results found for: CEA, CA125, LABCA2                      Note: Lab results reviewed.  Recent Diagnostic Imaging Results  DG Chest 2 View CLINICAL DATA:  Acute onset of right eye blurriness.  EXAM: CHEST  2 VIEW  COMPARISON:  08/24/2016  FINDINGS: The cardiac silhouette, mediastinal and hilar contours are within normal limits and stable. Mild eventration of the right hemidiaphragm with overlying atelectasis but no infiltrates or effusions. No pulmonary lesions. The bony thorax is intact.  IMPRESSION: No acute cardiopulmonary findings.  Electronically Signed   By: Marijo Sanes M.D.   On: 02/03/2017 15:46  Complexity Note: Imaging results reviewed. Results shared with Ms. Maugeri, using Layman's terms.                         Meds   Current Outpatient Medications:  .  albuterol (PROAIR HFA) 108 (90 Base) MCG/ACT inhaler, Inhale 2 puffs into the lungs every 6 (six) hours as needed. , Disp: , Rfl:  .  Ascorbic Acid (VITAMIN C) 100 MG tablet, Take 100 mg by mouth daily., Disp: , Rfl:  .  Aspirin-Acetaminophen-Caffeine (EXCEDRIN EXTRA STRENGTH PO), Take 2 tablets by mouth as needed., Disp: , Rfl:  .  budesonide-formoterol (SYMBICORT) 80-4.5 MCG/ACT inhaler, Inhale into the lungs., Disp: , Rfl:  .  gabapentin (NEURONTIN) 300 MG capsule, Take 1 capsule (300 mg total) by mouth every 8 (eight) hours., Disp: 90 capsule, Rfl: 0 .  metFORMIN (GLUCOPHAGE) 500 MG tablet, Take 500 mg by mouth 2 (two) times daily with a meal. , Disp: , Rfl:  .  metoprolol succinate (TOPROL-XL) 100 MG 24 hr tablet, Take 100 mg by mouth daily. , Disp: , Rfl:  .  Multiple Vitamin (MULTIVITAMIN) tablet, Take 1 tablet by mouth daily., Disp: , Rfl:  .   naproxen sodium (ALEVE) 220 MG tablet, Take 220 mg by mouth 2 (two) times daily as needed., Disp: , Rfl:  .  oxyCODONE (OXY IR/ROXICODONE) 5 MG immediate release tablet, Take 1 tablet (5 mg total) by mouth 5 (five) times daily as needed for severe pain., Disp: 150 tablet, Rfl: 0 .  venlafaxine (EFFEXOR) 25 MG tablet, Take 37.5 mg by mouth daily. , Disp: , Rfl:  .  cyclobenzaprine (FLEXERIL) 5 MG tablet, Take 1 tablet (5 mg total) by mouth 3 (three) times daily as needed for muscle spasms., Disp: 90 tablet, Rfl: 0 .  naproxen (EC NAPROSYN) 500 MG EC tablet, Take 1 tablet (500 mg total) by mouth 2 (two) times daily with a meal., Disp: 60 tablet, Rfl: 0  ROS  Constitutional: Denies any fever or chills Gastrointestinal: No reported hemesis, hematochezia, vomiting, or acute GI distress Musculoskeletal: Denies any acute onset joint swelling, redness, loss of ROM, or weakness Neurological: No reported episodes of acute onset apraxia, aphasia, dysarthria, agnosia, amnesia, paralysis, loss of coordination, or loss of consciousness  Allergies  Ms. Giuliano is allergic to atorvastatin; levofloxacin; morphine; penicillins; and tramadol hcl.  [place]  Drug: Ms. Normoyle  reports that she does not use drugs.  Alcohol:  reports that she drinks alcohol. Tobacco:  reports that she quit smoking about 2 years ago. Her smoking use included cigarettes. She has never used smokeless tobacco. Medical:  has a past medical history of ABDOMINAL PAIN OTHER SPECIFIED SITE (02/21/2009), ABDOMINAL PAIN-LUQ (04/17/2009), Anal fissure, Asthma, Diabetes mellitus without complication ([place]), YSAYTKZS(010.9), HEMATOCHEZIA (04/17/2009), Hypertension, IBS (irritable bowel syndrome), Migraine headache (11/06/2008), OVARIAN CYST, RIGHT (02/21/2009), and PALPITATIONS, OCCASIONAL (04/17/2010). Surgical: Ms. Kozel  has a past surgical history that includes Cesarean section; Vaginal hysterectomy; Cholecystectomy; and Breast reduction  surgery. Family: family history includes Cancer in her mother; Heart disease in her father; Hyperlipidemia in her father and mother; Hypertension in her father and mother.  Constitutional Exam  General appearance: Well nourished, well developed, and well hydrated. In no apparent acute distress Vitals:   05/06/17 0841 05/06/17 0843  BP: (!) 183/106 (!) 185/104  Pulse: 88   Resp: 16   Temp: 98.2 F (36.8 C)   SpO2: 100%   Weight: 182 lb (82.6 kg)   Height: '5\' 4"'$  (1.626 m)   Psych/Mental status: Alert, oriented x 3 (person, place, & time)       Eyes: PERLA Respiratory: No evidence of acute respiratory distress   Lumbar Spine Area Exam  Skin & Axial Inspection: No masses, redness, or swelling Alignment: Symmetrical Functional ROM: Unrestricted ROM      Stability: No instability detected Muscle Tone/Strength: Functionally intact. No obvious neuro-muscular anomalies detected. Sensory (Neurological): Unimpaired Palpation: No palpable anomalies       Provocative Tests: Lumbar Hyperextension and rotation test: evaluation deferred today       Lumbar Lateral bending test: evaluation deferred today       Patrick's Maneuver: Negative                    Gait & Posture Assessment  Ambulation: Unassisted Gait: Relatively normal for age and body habitus Posture: WNL   Lower Extremity Exam    Side: Right lower extremity  Side: Left lower extremity  Skin & Extremity Inspection: Skin color, temperature, and hair growth are WNL. No peripheral edema or cyanosis. No masses, redness, swelling, asymmetry, or associated skin lesions. No contractures.  Skin & Extremity Inspection: Skin color, temperature, and hair growth are WNL. No peripheral edema or cyanosis. No masses, redness, swelling, asymmetry, or associated skin lesions. No contractures.  Functional ROM: Unrestricted ROM          Functional ROM: Unrestricted ROM          Muscle Tone/Strength: Functionally intact. No obvious neuro-muscular  anomalies detected.  Muscle Tone/Strength: Functionally intact. No obvious neuro-muscular anomalies detected.  Sensory (Neurological): Unimpaired  Sensory (Neurological): Unimpaired  Palpation: Complains of area being tender   Palpation: Complains of area being tender    Assessment  Primary Diagnosis & Pertinent Problem List: The primary encounter diagnosis was Chronic pain of both hips. Diagnoses of Chronic knee pain (Location of Tertiary source of pain) (Bilateral) (L>R), Lumbar spondylosis, Pharmacologic therapy, Disorder of skeletal system, Problems influencing health status, and Chronic sacroiliac joint pain were also pertinent to this visit.  Status Diagnosis  Worsening Worsening Stable 1. Chronic pain of both hips   2. Chronic knee pain (Location of Tertiary source of pain) (Bilateral) (L>R)   3. Lumbar spondylosis   4. Pharmacologic therapy   5. Disorder of skeletal system   6. Problems influencing health status   7. Chronic sacroiliac joint pain     Problems updated and reviewed during this  visit: Problem  Pharmacologic Therapy  Disorder of Skeletal System  Problems Influencing Health Status  Chest Pain With Moderate Risk of Acute Coronary Syndrome  Doe (Dyspnea On Exertion)  Intermittent Palpitations   Plan of Care  Pharmacotherapy (Medications Ordered): Meds ordered this encounter  Medications  . naproxen (EC NAPROSYN) 500 MG EC tablet    Sig: Take 1 tablet (500 mg total) by mouth 2 (two) times daily with a meal.    Dispense:  60 tablet    Refill:  0    Order Specific Question:   Supervising Provider    AnswerMilinda Pointer [phone]  . cyclobenzaprine (FLEXERIL) 5 MG tablet    Sig: Take 1 tablet (5 mg total) by mouth 3 (three) times daily as needed for muscle spasms.    Dispense:  90 tablet    Refill:  0    Do not place this medication, or any other prescription from our practice, on "Automatic Refill". Patient may have prescription filled one day early if  pharmacy is closed on scheduled refill date.    Order Specific Question:   Supervising Provider    Answer:   Milinda Pointer [phone]  . orphenadrine (NORFLEX) injection 60 mg  . ketorolac (TORADOL) injection 60 mg   New Prescriptions   CYCLOBENZAPRINE (FLEXERIL) 5 MG TABLET    Take 1 tablet (5 mg total) by mouth 3 (three) times daily as needed for muscle spasms.   NAPROXEN (EC NAPROSYN) 500 MG EC TABLET    Take 1 tablet (500 mg total) by mouth 2 (two) times daily with a meal.   Medications administered today: We administered orphenadrine and ketorolac. Lab-work, procedure(s), and/or referral(s): Orders Placed This Encounter  Procedures  . DG HIP UNILAT W OR W/O PELVIS 2-3 VIEWS LEFT  . DG HIP UNILAT W OR W/O PELVIS 2-3 VIEWS RIGHT  . DG Si Joints  . Comp. Metabolic Panel (12)  . Vitamin B12  . Magnesium  . Sedimentation rate  . 25-Hydroxyvitamin D Lcms D2+D3  . C-reactive protein   Imaging and/or referral(s): DG HIP UNILAT W OR W/O PELVIS 2-3 VIEWS LEFT DG HIP UNILAT W OR W/O PELVIS 2-3 VIEWS RIGHT DG SI JOINTS  Interventional therapies: Planned, scheduled, and/or pending: Not at this time. Complete above and follow up with Dr Delane Ginger for evaluation.    Considering: Diagnostic bilateral hip joint injection Palliative bilateral lumbar facet block  Bilateral lumbar facet radiofrequency ablation.   Palliative PRN treatment(s): Diagnostic bilateral hip joint injection  Palliative Bilateral lumbar facet block plus left L1 transverse process injection       Provider-requested follow-up: Return in about 2 weeks (around 05/20/2017).  Future Appointments  Date Time Provider [place]  05/19/2017  8:30 AM Milinda Pointer, MD [facility] None  05/24/2017 10:00 AM Allyne Gee, MD [place] None   Primary Care Physician: Leonel Ramsay, MD Location: [facility] Outpatient [facility] Management [facility] Note by: Vevelyn Francois NP Date: 05/06/2017; Time: 10:52  AM  Pain Score Disclaimer: We use the NRS-11 scale. This is a self-reported, subjective measurement of pain severity with only modest accuracy. It is used primarily to identify changes within a particular patient. It must be understood that outpatient pain scales are significantly less accurate that those used for research, where they can be applied under ideal controlled circumstances with minimal exposure to variables. In reality, the score is likely to be a combination of pain intensity and pain affect, where pain affect describes the degree of  emotional arousal or changes in action readiness caused by the sensory experience of pain. Factors such as social and work situation, setting, emotional state, anxiety levels, expectation, and prior pain experience may influence pain perception and show large inter-individual differences that may also be affected by time variables.  Patient instructions provided during this appointment: Patient Instructions   BMI Assessment: Estimated body mass index is 31.24 kg/m as calculated from the following:   Height as of this encounter: _0  (1.626 m).   Weight as of this encounter: 182 lb (82.6 kg).  BMI interpretation table: BMI level Category Range association with higher incidence of chronic pain  <18 kg/m2 Underweight   18.5-24.9 kg/m2 Ideal body weight   25-29.9 kg/m2 Overweight Increased incidence by 20%  30-34.9 kg/m2 Obese (Class I) Increased incidence by 68%  35-39.9 kg/m2 Severe obesity (Class II) Increased incidence by 136%  >40 kg/m2 Extreme obesity (Class III) Increased incidence by 254%   BMI Readings from Last 4 Encounters:  05/06/17 31.24 kg/m  04/14/17 31.76 kg/m  02/22/17 32.06 kg/m  02/03/17 35.52 kg/m   Wt Readings from Last 4 Encounters:  05/06/17 182 lb (82.6 kg)  04/14/17 185 lb (83.9 kg)  02/22/17 186 lb 12.8 oz (84.7 kg)  02/03/17 188 lb (85.3 kg)

## 2017-05-11 ENCOUNTER — Encounter: Payer: BLUE CROSS/BLUE SHIELD | Admitting: Nurse Practitioner

## 2017-05-13 ENCOUNTER — Ambulatory Visit
Admission: RE | Admit: 2017-05-13 | Discharge: 2017-05-13 | Disposition: A | Discharge status: Home / Self Care | Payer: BLUE CROSS/BLUE SHIELD | Source: Ambulatory Visit | Attending: Nurse Practitioner | Admitting: Nurse Practitioner

## 2017-05-13 DIAGNOSIS — M533 Sacrococcygeal disorders, not elsewhere classified: Secondary | ICD-10-CM

## 2017-05-13 DIAGNOSIS — G8929 Other chronic pain: Secondary | ICD-10-CM

## 2017-05-13 DIAGNOSIS — M25551 Pain in right hip: Secondary | ICD-10-CM | POA: Diagnosis present

## 2017-05-13 DIAGNOSIS — M25552 Pain in left hip: Secondary | ICD-10-CM | POA: Diagnosis present

## 2017-05-15 LAB — MAGNESIUM: Magnesium: 2 mg/dL (ref 1.6–2.3)

## 2017-05-15 LAB — COMP. METABOLIC PANEL (12)
ALK PHOS: 87 IU/L (ref 39–117)
AST: 30 IU/L (ref 0–40)
Albumin/Globulin Ratio: 1.4 (ref 1.2–2.2)
Albumin: 4.1 g/dL (ref 3.5–5.5)
BUN/Creatinine Ratio: 14 (ref 9–23)
BUN: 10 mg/dL (ref 6–24)
CREATININE: 0.72 mg/dL (ref 0.57–1.00)
Calcium: 9.6 mg/dL (ref 8.7–10.2)
Chloride: 95 mmol/L — ABNORMAL LOW (ref 96–106)
GFR calc Af Amer: 117 mL/min/{1.73_m2} (ref >59)
GFR calc non Af Amer: 101 mL/min/{1.73_m2} (ref >59)
Globulin, Total: 3 g/dL (ref 1.5–4.5)
Glucose: 237 mg/dL — ABNORMAL HIGH (ref 65–99)
Potassium: 4.6 mmol/L (ref 3.5–5.2)
Sodium: 135 mmol/L (ref 134–144)
TOTAL PROTEIN: 7.1 g/dL (ref 6.0–8.5)

## 2017-05-15 LAB — SEDIMENTATION RATE: Sed Rate: 33 mm/hr — ABNORMAL HIGH (ref 0–32)

## 2017-05-15 LAB — VITAMIN B12: VITAMIN B 12: 828 pg/mL (ref 232–1245)

## 2017-05-15 LAB — 25-HYDROXYVITAMIN D LCMS D2+D3
25-HYDROXY, VITAMIN D-2: 2.9 ng/mL
25-HYDROXY, VITAMIN D-3: 19 ng/mL
25-HYDROXY, VITAMIN D: 22 ng/mL — AB

## 2017-05-15 LAB — C-REACTIVE PROTEIN: CRP: 5.6 mg/L — AB (ref 0.0–4.9)

## 2017-05-19 ENCOUNTER — Encounter: Payer: Self-pay | Admitting: Pain Medicine

## 2017-05-19 ENCOUNTER — Other Ambulatory Visit: Payer: Self-pay

## 2017-05-19 ENCOUNTER — Ambulatory Visit: Payer: BLUE CROSS/BLUE SHIELD | Attending: Pain Medicine | Admitting: Pain Medicine

## 2017-05-19 VITALS — BP 177/100 | HR 82 | Temp 97.7°F | Resp 18 | Ht 64.0 in | Wt 189.0 lb

## 2017-05-19 DIAGNOSIS — I10 Essential (primary) hypertension: Secondary | ICD-10-CM | POA: Insufficient documentation

## 2017-05-19 DIAGNOSIS — E7800 Pure hypercholesterolemia, unspecified: Secondary | ICD-10-CM | POA: Insufficient documentation

## 2017-05-19 DIAGNOSIS — K5900 Constipation, unspecified: Secondary | ICD-10-CM | POA: Diagnosis not present

## 2017-05-19 DIAGNOSIS — M25562 Pain in left knee: Secondary | ICD-10-CM | POA: Insufficient documentation

## 2017-05-19 DIAGNOSIS — M792 Neuralgia and neuritis, unspecified: Secondary | ICD-10-CM

## 2017-05-19 DIAGNOSIS — E669 Obesity, unspecified: Secondary | ICD-10-CM | POA: Diagnosis not present

## 2017-05-19 DIAGNOSIS — M545 Low back pain, unspecified: Secondary | ICD-10-CM

## 2017-05-19 DIAGNOSIS — E785 Hyperlipidemia, unspecified: Secondary | ICD-10-CM | POA: Diagnosis not present

## 2017-05-19 DIAGNOSIS — Z79891 Long term (current) use of opiate analgesic: Secondary | ICD-10-CM | POA: Diagnosis not present

## 2017-05-19 DIAGNOSIS — Z6832 Body mass index (BMI) 32.0-32.9, adult: Secondary | ICD-10-CM | POA: Diagnosis not present

## 2017-05-19 DIAGNOSIS — E119 Type 2 diabetes mellitus without complications: Secondary | ICD-10-CM | POA: Insufficient documentation

## 2017-05-19 DIAGNOSIS — K2100 Gastro-esophageal reflux disease with esophagitis, without bleeding: Secondary | ICD-10-CM

## 2017-05-19 DIAGNOSIS — F329 Major depressive disorder, single episode, unspecified: Secondary | ICD-10-CM | POA: Insufficient documentation

## 2017-05-19 DIAGNOSIS — S32009S Unspecified fracture of unspecified lumbar vertebra, sequela: Secondary | ICD-10-CM | POA: Diagnosis not present

## 2017-05-19 DIAGNOSIS — M47817 Spondylosis without myelopathy or radiculopathy, lumbosacral region: Secondary | ICD-10-CM | POA: Diagnosis not present

## 2017-05-19 DIAGNOSIS — R7982 Elevated C-reactive protein (CRP): Secondary | ICD-10-CM | POA: Diagnosis not present

## 2017-05-19 DIAGNOSIS — E559 Vitamin D deficiency, unspecified: Secondary | ICD-10-CM

## 2017-05-19 DIAGNOSIS — G8929 Other chronic pain: Secondary | ICD-10-CM | POA: Insufficient documentation

## 2017-05-19 DIAGNOSIS — M25561 Pain in right knee: Secondary | ICD-10-CM | POA: Insufficient documentation

## 2017-05-19 DIAGNOSIS — M25551 Pain in right hip: Secondary | ICD-10-CM | POA: Diagnosis not present

## 2017-05-19 DIAGNOSIS — M47816 Spondylosis without myelopathy or radiculopathy, lumbar region: Secondary | ICD-10-CM

## 2017-05-19 DIAGNOSIS — G894 Chronic pain syndrome: Secondary | ICD-10-CM | POA: Insufficient documentation

## 2017-05-19 DIAGNOSIS — R079 Chest pain, unspecified: Secondary | ICD-10-CM | POA: Diagnosis not present

## 2017-05-19 DIAGNOSIS — R700 Elevated erythrocyte sedimentation rate: Secondary | ICD-10-CM

## 2017-05-19 DIAGNOSIS — Z5181 Encounter for therapeutic drug level monitoring: Secondary | ICD-10-CM | POA: Insufficient documentation

## 2017-05-19 DIAGNOSIS — M25552 Pain in left hip: Secondary | ICD-10-CM | POA: Diagnosis not present

## 2017-05-19 DIAGNOSIS — K219 Gastro-esophageal reflux disease without esophagitis: Secondary | ICD-10-CM | POA: Insufficient documentation

## 2017-05-19 MED ORDER — VITAMIN D3 125 MCG (5000 UT) PO CAPS: 1.0000 | ORAL_CAPSULE | Freq: Every day | ORAL | 5 refills | Status: DC

## 2017-05-19 MED ORDER — GABAPENTIN 300 MG PO CAPS
300.0000 mg | ORAL_CAPSULE | Freq: Every day | ORAL | 5 refills | Status: DC
Start: 2017-05-19 — End: 2018-11-28

## 2017-05-19 MED ORDER — MAGNESIUM 500 MG PO CAPS: 500.0000 mg | ORAL_CAPSULE | Freq: Two times a day (BID) | ORAL | 5 refills | Status: DC

## 2017-05-19 MED ORDER — OMEPRAZOLE 20 MG PO CPDR
20.0000 mg | DELAYED_RELEASE_CAPSULE | Freq: Every day | ORAL | 3 refills | Status: DC
Start: 2017-05-19 — End: 2018-11-28

## 2017-05-19 MED ORDER — CYCLOBENZAPRINE HCL 5 MG PO TABS: 5.0000 mg | ORAL_TABLET | Freq: Three times a day (TID) | ORAL | 5 refills | Status: DC | PRN

## 2017-05-19 MED ORDER — ERGOCALCIFEROL 1.25 MG (50000 UT) PO CAPS: 50000.0000 [IU] | ORAL_CAPSULE | ORAL | 0 refills | Status: AC

## 2017-05-19 MED ORDER — GNP CALCIUM 1200 1200-1000 MG-UNIT PO CHEW: 1200.0000 mg | CHEWABLE_TABLET | Freq: Every day | ORAL | 5 refills | Status: DC

## 2017-05-19 MED ORDER — HYDROCODONE-ACETAMINOPHEN 5-325 MG PO TABS: 1.0000 | ORAL_TABLET | Freq: Every day | ORAL | 0 refills | Status: DC | PRN

## 2017-05-19 MED ORDER — ESOMEPRAZOLE MAGNESIUM 40 MG PO CPDR: 40.0000 mg | DELAYED_RELEASE_CAPSULE | Freq: Two times a day (BID) | ORAL | 0 refills | Status: DC

## 2017-05-19 MED ORDER — MELOXICAM 15 MG PO TABS: 15.0000 mg | ORAL_TABLET | Freq: Every day | ORAL | 0 refills | Status: DC

## 2017-05-19 NOTE — Patient Instructions (Addendum)
____You have one prescription of Norco given today. Other prescriptions sent to your pharmacy. ________________________________________________________________________________________  Drug Holidays (Slow)  What is a "Drug Holiday"? Drug Holiday: is the name given to the period of time during which a patient stops taking a medication(s) for the purpose of eliminating tolerance to the drug.  Benefits . Improved effectiveness of opioids. . Decreased opioid dose needed to achieve benefits. . Improved pain with lesser dose.  What is tolerance? Tolerance: is the progressive decreased in effectiveness of a drug due to its repetitive use. With repetitive use, the body gets use to the medication and as a consequence, it loses its effectiveness. This is a common problem seen with opioid pain medications. As a result, a larger dose of the drug is needed to achieve the same effect that used to be obtained with a smaller dose.  How long should a "Drug Holiday" last? At least 14 consecutive days. (2 weeks)  What are withdrawals? Withdrawals: refers to the wide range of symptoms that occur after stopping or dramatically reducing opiate drugs after heavy and prolonged use. Withdrawal symptoms do not occur to patients that use low dose opioids, or those who take the medication sporadically. Contrary to benzodiazepine (example: Valium, Xanax, etc.) or alcohol withdrawals ("Delirium Tremens"), opioid withdrawals are not lethal. Withdrawals are the physical manifestation of the body getting rid of the excess receptors.  Expected Symptoms Early symptoms of withdrawal may include: . Agitation . Anxiety . Muscle aches . Increased tearing . Insomnia . Runny nose . Sweating . Yawning  Late symptoms of withdrawal may include: . Abdominal cramping . Diarrhea . Dilated pupils . Goose bumps . Nausea . Vomiting  Will I experience withdrawals? Due to the slow nature of the taper, it is very unlikely that  you will experience any.  What is a slow taper? Taper: refers to the gradual decrease in dose. ___________________________________________________________________________________________ ____________________________________________________________________________________________  Medication Rules  Applies to: All patients receiving prescriptions (written or electronic).  Pharmacy of record: Pharmacy where electronic prescriptions will be sent. If written prescriptions are taken to a different pharmacy, please inform the nursing staff. The pharmacy listed in the electronic medical record should be the one where you would like electronic prescriptions to be sent.  Prescription refills: Only during scheduled appointments. Applies to both, written and electronic prescriptions.  NOTE: The following applies primarily to controlled substances (Opioid* Pain Medications).   Patient's responsibilities: 1. Pain Pills: Bring all pain pills to every appointment (except for procedure appointments). 2. Pill Bottles: Bring pills in original pharmacy bottle. Always bring newest bottle. Bring bottle, even if empty. 3. Medication refills: You are responsible for knowing and keeping track of what medications you need refilled. The day before your appointment, write a list of all prescriptions that need to be refilled. Bring that list to your appointment and give it to the admitting nurse. Prescriptions will be written only during appointments. If you forget a medication, it will not be "Called in", "Faxed", or "electronically sent". You will need to get another appointment to get these prescribed. 4. Prescription Accuracy: You are responsible for carefully inspecting your prescriptions before leaving our office. Have the discharge nurse carefully go over each prescription with you, before taking them home. Make sure that your name is accurately spelled, that your address is correct. Check the name and dose of your  medication to make sure it is accurate. Check the number of pills, and the written instructions to make sure they are clear and  accurate. Make sure that you are given enough medication to last until your next medication refill appointment. 5. Taking Medication: Take medication as prescribed. Never take more pills than instructed. Never take medication more frequently than prescribed. Taking less pills or less frequently is permitted and encouraged, when it comes to controlled substances (written prescriptions).  6. Inform other Doctors: Always inform, all of your healthcare providers, of all the medications you take. 7. Pain Medication from other Providers: You are not allowed to accept any additional pain medication from any other Doctor or Healthcare provider. There are two exceptions to this rule. (see below) In the event that you require additional pain medication, you are responsible for notifying us, as stated below. 8. Medication Agreement: You are responsible for carefully reading and following our Medication Agreement. This must be signed before receiving any prescriptions from our practice. Safely store a copy of your signed Agreement. Violations to the Agreement will result in no further prescriptions. (Additional copies of our Medication Agreement are available upon request.) 9. Laws, Rules, & Regulations: All patients are expected to follow all Federal and [employer], [employer], Rules, [employer]. Ignorance of the Laws does not constitute a valid excuse. The use of any illegal substances is prohibited. 10. Adopted CDC guidelines & recommendations: Target dosing levels will be at or below 60 MME/day. Use of benzodiazepines** is not recommended.  Exceptions: There are only two exceptions to the rule of not receiving pain medications from other Healthcare Providers. 1. Exception #1 (Emergencies): In the event of an emergency (i.e.: accident requiring emergency care), you are allowed to receive  additional pain medication. However, you are responsible for: As soon as you are able, call our office [phone], at any time of the day or night, and leave a message stating your name, the date and nature of the emergency, and the name and dose of the medication prescribed. In the event that your call is answered by a member of our staff, make sure to document and save the date, time, and the name of the person that took your information.  2. Exception #2 (Planned Surgery): In the event that you are scheduled by another doctor or dentist to have any type of surgery or procedure, you are allowed (for a period no longer than 30 days), to receive additional pain medication, for the acute post-op pain. However, in this case, you are responsible for picking up a copy of our "Post-op Pain Management for Surgeons" handout, and giving it to your surgeon or dentist. This document is available at our office, and does not require an appointment to obtain it. Simply go to our office during business hours (Monday-Thursday from 8:00 AM to 4:00 PM) (Friday 8:00 AM to 12:00 Noon) or if you have a scheduled appointment with [place], prior to your surgery, and ask for it by name. In addition, you will need to provide [place] with your name, name of your surgeon, type of surgery, and date of procedure or surgery.  *Opioid medications include: morphine, codeine, oxycodone, oxymorphone, hydrocodone, hydromorphone, meperidine, tramadol, tapentadol, buprenorphine, fentanyl, methadone. **Benzodiazepine medications include: diazepam (Valium), alprazolam (Xanax), clonazepam (Klonopine), lorazepam (Ativan), clorazepate (Tranxene), chlordiazepoxide (Librium), estazolam (Prosom), oxazepam (Serax), temazepam (Restoril), triazolam (Halcion) (Last updated:  04/01/2017) ____________________________________________________________________________________________  ____________________________________________________________________________________________  Medication Recommendations and Reminders  Applies to: All patients receiving prescriptions (written and/or electronic).  Medication Rules & Regulations: These rules and regulations exist for your safety and that of others. They are not flexible and neither are we.  Dismissing or ignoring them will be considered "non-compliance" with medication therapy, resulting in complete and irreversible termination of such therapy. (See document titled "Medication Rules" for more details.) In all conscience, because of safety reasons, we cannot continue providing a therapy where the patient does not follow instructions.  Pharmacy of record:   Definition: This is the pharmacy where your electronic prescriptions will be sent.   We do not endorse any particular pharmacy.  You are not restricted in your choice of pharmacy.  The pharmacy listed in the electronic medical record should be the one where you want electronic prescriptions to be sent.  If you choose to change pharmacy, simply notify our nursing staff of your choice of new pharmacy.  Recommendations:  Keep all of your pain medications in a safe place, under lock and key, even if you live alone.   After you fill your prescription, take 1 week's worth of pills and put them away in a safe place. You should keep a separate, properly labeled bottle for this purpose. The remainder should be kept in the original bottle. Use this as your primary supply, until it runs out. Once it's gone, then you know that you have 1 week's worth of medicine, and it is time to come in for a prescription refill. If you do this correctly, it is unlikely that you will ever run out of medicine.  To make sure that the above recommendation works, it is very important that you make  sure your medication refill appointments are scheduled at least 1 week before you run out of medicine. To do this in an effective manner, make sure that you do not leave the office without scheduling your next medication management appointment. Always ask the nursing staff to show you in your prescription , when your medication will be running out. Then arrange for the receptionist to get you a return appointment, at least 7 days before you run out of medicine. Do not wait until you have 1 or 2 pills left, to come in. This is very poor planning and does not take into consideration that we may need to cancel appointments due to bad weather, sickness, or emergencies affecting our staff.  Prescription refills and/or changes in medication(s):   Prescription refills, and/or changes in dose or medication, will be conducted only during scheduled medication management appointments. (Applies to both, written and electronic prescriptions.)  No refills on procedure days. No medication will be changed or started on procedure days. No changes, adjustments, and/or refills will be conducted on a procedure day. Doing so will interfere with the diagnostic portion of the procedure.  No phone refills. No medications will be "called into the pharmacy".  No Fax refills.  No weekend refills.  No Holliday refills.  No after hours refills.  Remember:  Business hours are:  Monday to Thursday 8:00 AM to 4:00 PM Provider's Schedule: Dionisio David, NP - Appointments are:  Medication management: Monday to Thursday 8:00 AM to 4:00 PM Milinda Pointer, MD - Appointments are:  Medication management: Monday and Wednesday 8:00 AM to 4:00 PM Procedure day: Tuesday and Thursday 7:30 AM to 4:00 PM Gillis Santa, MD - Appointments are:  Medication management: Tuesday and Thursday 8:00 AM to 4:00 PM Procedure day: Monday and Wednesday 7:30 AM to 4:00 PM (Last update:  04/01/2017) ____________________________________________________________________________________________  ____________________________________________________________________________________________  CANNABIDIOL (AKA: CBD Oil or Pills)  Applies to: All patients receiving prescriptions of controlled substances (written and/or electronic).  General Information: Cannabidiol (CBD) was discovered  in 1940. It is one of some 113 identified cannabinoids in cannabis (Marijuana) plants, accounting for up to 40% of the plant's extract. As of 2018, preliminary clinical research on cannabidiol included studies of anxiety, cognition, movement disorders, and pain.  Cannabidiol is consummed in multiple ways, including inhalation of cannabis smoke or vapor, as an aerosol spray into the cheek, and by mouth. It may be supplied as CBD oil containing CBD as the active ingredient (no added tetrahydrocannabinol (THC) or terpenes), a full-plant CBD-dominant hemp extract oil, capsules, dried cannabis, or as a liquid solution. CBD is thought not have the same psychoactivity as THC, and may affect the actions of THC. Studies suggest that CBD may interact with different biological targets, including cannabinoid receptors and other neurotransmitter receptors. As of 2018 the mechanism of action for its biological effects has not been determined.  In the [place], cannabidiol has a limited approval by the Food and Drug Administration (FDA) for treatment of only two types of epilepsy disorders. The side effects of long-term use of the drug include somnolence, decreased appetite, diarrhea, fatigue, malaise, weakness, sleeping problems, and others.  CBD remains a Schedule I drug prohibited for any use.  Legality: Some manufacturers ship CBD products nationally, an illegal action which the FDA has not enforced in 2018, with CBD remaining the subject of an FDA investigational new drug evaluation, and is not considered legal as a  dietary supplement or food ingredient as of December 2018. Federal illegality has made it difficult historically to conduct research on CBD. CBD is openly sold in head shops and health food stores in some states where such sales have not been explicitly legalized.  Warning: Because it is not FDA approved for general use or treatment of pain, it is not required to undergo the same manufacturing controls as prescription drugs.  This means that the available cannabidiol (CBD) may be contaminated with THC.  If this is the case, it will trigger a positive urine drug screen (UDS) test for cannabinoids (Marijuana).  Because a positive UDS for illicit substances is a violation of our medication agreement, your opioid analgesics (pain medicine) may be permanently discontinued. (Last update: 04/22/2017) ____________________________________________________________________________________________    _ ____________________________________________________________________________________________  Preparing for Procedure with Sedation  Instructions: . Oral Intake: Do not eat or drink anything for at least 8 hours prior to your procedure. . Transportation: Public transportation is not allowed. Bring an adult driver. The driver must be physically present in our waiting room before any procedure can be started. [place]land Kitchen Physical Assistance: Bring an adult physically capable of assisting you, in the event you need help. This adult should keep you company at home for at least 6 hours after the procedure. . Blood Pressure Medicine: Take your blood pressure medicine with a sip of water the morning of the procedure. . Blood thinners:  . Diabetics on insulin: Notify the staff so that you can be scheduled 1st case in the morning. If your diabetes requires high dose insulin, take only  of your normal insulin dose the morning of the procedure and notify the staff that you have done so. . Preventing infections: Shower with an  antibacterial soap the morning of your procedure. . Build-up your immune system: Take 1000 mg of Vitamin C with every meal (3 times a day) the day prior to your procedure. [place]land Kitchen Antibiotics: Inform the staff if you have a condition or reason that requires you to take antibiotics before dental procedures. . Pregnancy: If you are pregnant,  call and cancel the procedure. . Sickness: If you have a cold, fever, or any active infections, call and cancel the procedure. . Arrival: You must be in the facility at least 30 minutes prior to your scheduled procedure. . Children: Do not bring children with you. . Dress appropriately: Bring dark clothing that you would not mind if they get stained. . Valuables: Do not bring any jewelry or valuables.  Procedure appointments are reserved for interventional treatments only. [place]land Kitchen No Prescription Refills. . No medication changes will be discussed during procedure appointments. . No disability issues will be discussed.  Remember:  Regular Business hours are:  Monday to Thursday 8:00 AM to 4:00 PM  Provider's Schedule: Milinda Pointer, MD:  Procedure days: Tuesday and Thursday 7:30 AM to 4:00 PM  Gillis Santa, MD:  Procedure days: Monday and Wednesday 7:30 AM to 4:00 PM ____________________________________________________________________________________________

## 2017-05-19 NOTE — Progress Notes (Signed)
Patient's Name: Kelly Rollins  MRN: [id number]  Referring Provider: Leonel Ramsay, MD  DOB: Jul 19, 1970  PCP: Leonel Ramsay, MD  DOS: 05/19/2017  Note by: Gaspar Cola, MD  Service setting: Ambulatory outpatient  Specialty: Interventional Pain Management  Location: [facility] (AMB) Pain Management Facility    Patient type: Established   Primary Reason(s) for Visit: Encounter for prescription drug management. (Level of risk: moderate)  CC: Back Pain (lower mid back); Hip Pain (bilateral down to front of knees ); and Knee Pain  HPI  Kelly Rollins is a 47 y.o. year old, female patient, who comes today for a medication management evaluation. She has TOBACCO ABUSE; GERD; Constipation; Pure hypercholesterolemia; Hyperlipidemia; Fatty liver disease; Anxiety; Obesity; Essential hypertriglyceridemia; Rollins term current use of opiate analgesic; Rollins term prescription opiate use; Opiate use (25 MME/Day); Encounter for therapeutic drug level monitoring; Encounter for pain management planning; Chronic low back pain (Primary Source of Pain) (Bilateral) (L>R); Lumbar facet syndrome (Bilateral) (L>R); Chronic knee pain [facility] source of pain) (Bilateral) (L>R); Lumbar spondylosis; Neuropathic pain; Neurogenic pain; Myofascial pain; Acute low back pain; Lumbar transverse process fracture (HCC) (Left L1) (03/11/15); Chronic pain syndrome; Depression; Diabetes mellitus type 2, uncomplicated ([place]); Fatty liver; HTN (hypertension), benign; Vitamin D insufficiency; Acute postoperative pain; Lumbar radiculopathy; Chest pain with moderate risk of acute coronary syndrome; DOE (dyspnea on exertion); Intermittent palpitations; Pharmacologic therapy; Disorder of skeletal system; Problems influencing health status; Chronic hip pain (Secondary source of pain) (Bilateral) (L>R); Elevated C-reactive protein (CRP); Elevated sed rate; and Spondylosis without myelopathy or radiculopathy, lumbosacral region on their problem  list. Her primarily concern today is the Back Pain (lower mid back); Hip Pain (bilateral down to front of knees ); and Knee Pain  Pain Assessment: Location: Lower, Mid Back Radiating: radiates from hips to knees bilaterally.  Onset: More than a month ago Duration: Chronic pain Quality: Aching, Throbbing Severity: 5 /10 (self-reported pain score)  Note: Reported level is compatible with observation.                         When using our objective Pain Scale, levels between 6 and 10/10 are said to belong in an emergency room, as it progressively worsens from a 6/10, described as severely limiting, requiring emergency care not usually available at an outpatient pain management facility. At a 6/10 level, communication becomes difficult and requires great effort. Assistance to reach the emergency department may be required. Facial flushing and profuse sweating along with potentially dangerous increases in heart rate and blood pressure will be evident. Effect on ADL: Limiations with gait by pain. Daughters help patient with household duties Timing: Constant Modifying factors: Medications are no longer helping   Kelly Rollins was last scheduled for an appointment on Visit date not found for medication management. During today's appointment we reviewed Kelly Rollins's chronic pain status, as well as her outpatient medication regimen.  The patient  reports that she does not use drugs. Her body mass index is 32.44 kg/m.  Further details on both, my assessment(s), as well as the proposed treatment plan, please see below.  Controlled Substance Pharmacotherapy Assessment REMS (Risk Evaluation and Mitigation Strategy)  Analgesic: Oxycodone IRone tablet 5 times a day (25 mg/day) MME/day:37.74m/day  RJanne Napoleon RN  05/19/2017  8:39 AM  Sign at close encounter Safety precautions to be maintained throughout the outpatient stay will include: orient to surroundings, keep bed in low position, maintain call  bell within  reach at all times, provide assistance with transfer out of bed and ambulation.    Pharmacokinetics: Liberation and absorption (onset of action): WNL Distribution (time to peak effect): WNL Metabolism and excretion (duration of action): WNL         Pharmacodynamics: Desired effects: Analgesia: Kelly Rollins reports >50% benefit. Functional ability: Patient reports that medication allows her to accomplish basic ADLs Clinically meaningful improvement in function (CMIF): Sustained CMIF goals met Perceived effectiveness: Described as relatively effective, allowing for increase in activities of daily living (ADL) Undesirable effects: Side-effects or Adverse reactions: None reported Monitoring: [place] PMP: Online review of the past 66-monthperiod conducted. Compliant with practice rules and regulations Last UDS on record: Summary  Date Value Ref Range Status  01/18/2017 FINAL  Final    Comment:    ==================================================================== TOXASSURE SELECT 13 (MW) ==================================================================== Test                             Result       Flag       Units Drug Absent but Declared for Prescription Verification   Oxycodone                      Not Detected UNEXPECTED ng/mg creat ==================================================================== Test                      Result    Flag   Units      Ref Range   Creatinine              209              mg/dL      >=20 ==================================================================== Declared Medications:  The flagging and interpretation on this report are based on the  following declared medications.  Unexpected results may arise from  inaccuracies in the declared medications.  **Note: The testing scope of this panel includes these medications:  Oxycodone  **Note: The testing scope of this panel does not include following  reported medications:  Acetaminophen   Albuterol  Aspirin  Caffeine  Gabapentin  Ibuprofen  Magnesium  Metformin  Metoprolol (Toprol)  Multivitamin (MVI)  Ondansetron (Zofran)  Venlafaxine (Effexor)  Vitamin C  Vitamin D3 ==================================================================== For clinical consultation, please call ([phone] ====================================================================    UDS interpretation: Compliant          Medication Assessment Form: Reviewed. Patient indicates being compliant with therapy Treatment compliance: Compliant Risk Assessment Profile: Aberrant behavior: See prior evaluations. None observed or detected today Comorbid factors increasing risk of overdose: See prior notes. No additional risks detected today Risk of substance use disorder (SUD): Low Opioid Risk Tool - 05/19/17 0838      Family History of Substance Abuse   Alcohol  Negative    Illegal Drugs  Negative    Rx Drugs  Negative      Personal History of Substance Abuse   Alcohol  Negative    Illegal Drugs  Negative    Rx Drugs  Negative      Age   Age between 142-45years   Yes      History of Preadolescent Sexual Abuse   History of Preadolescent Sexual Abuse  Negative or Female      Psychological Disease   Psychological Disease  Negative    Depression  Negative      Total Score   Opioid Risk Tool Scoring  1  Opioid Risk Interpretation  Low Risk      ORT Scoring interpretation table:  Score <3 = Low Risk for SUD  Score between 4-7 = Moderate Risk for SUD  Score >8 = High Risk for Opioid Abuse   Risk Mitigation Strategies:  Patient Counseling: Covered Patient-Prescriber Agreement (PPA): Present and active  Notification to other healthcare providers: Done  Pharmacologic Plan: No change in therapy, at this time.             Laboratory Chemistry  Inflammation Markers (CRP: Acute Phase) (ESR: Chronic Phase) Lab Results  Component Value Date   CRP 5.6 (H) 05/06/2017   ESRSEDRATE 33  (H) 05/06/2017   LATICACIDVEN 2.0 (HH) 02/03/2017                         Rheumatology Markers No results found.  Renal Function Markers Lab Results  Component Value Date   BUN 10 05/06/2017   CREATININE 0.72 05/06/2017   GFRAA 117 05/06/2017   GFRNONAA 101 05/06/2017                              Hepatic Function Markers Lab Results  Component Value Date   AST 30 05/06/2017   ALT 16 02/03/2017   ALBUMIN 4.1 05/06/2017   ALKPHOS 87 05/06/2017   AMYLASE 40 04/03/2010   LIPASE 25.0 04/03/2010                        Electrolytes Lab Results  Component Value Date   NA 135 05/06/2017   K 4.6 05/06/2017   CL 95 (L) 05/06/2017   CALCIUM 9.6 05/06/2017   MG 2.0 05/06/2017                        Neuropathy Markers Lab Results  Component Value Date   NWGNFAOZ30 865 05/06/2017                        Bone Pathology Markers Lab Results  Component Value Date   25OHVITD1 22 (L) 05/06/2017   25OHVITD2 2.9 05/06/2017   25OHVITD3 19 05/06/2017                         Coagulation Parameters Lab Results  Component Value Date   PLT 336 02/03/2017                        Cardiovascular Markers Lab Results  Component Value Date   CKTOTAL 109 03/18/2013   CKMB 1.7 03/18/2013   TROPONINI <0.03 02/03/2017   HGB 12.1 02/03/2017   HCT 36.1 02/03/2017                         Note: Lab results reviewed.  Recent Diagnostic Imaging Review  Cervical Imaging: Cervical CT wo contrast:  Results for orders placed during the hospital encounter of 03/11/15  CT Cervical Spine Wo Contrast   Narrative CLINICAL DATA:  Pain following fall  EXAM: CT CERVICAL, THORACIC, AND LUMBAR SPINE WITHOUT CONTRAST  TECHNIQUE: Multidetector CT imaging of the cervical, thoracic and lumbar spine was performed without intravenous contrast. Multiplanar CT image reconstructions were also generated.  COMPARISON:  None.  FINDINGS: CT CERVICAL SPINE FINDINGS  There is no fracture or  spondylolisthesis. Prevertebral  soft tissues and predental space regions are normal. The disc spaces appear normal. No nerve root edema or effacement. No disc extrusion or stenosis.  The visualized brain parenchyma appears normal. The visualized mastoid air cells are clear. Visualized lung apices are clear.  CT THORACIC SPINE FINDINGS  There is no demonstrable fracture or spondylolisthesis. There is slight mid thoracic dextroscoliosis. There are multiple anterior osteophytes. There is slight disc space narrowing at several levels in the lower thoracic region. There is mild facet hypertrophy at several levels without nerve root edema or effacement. No disc extrusion or stenosis is evident. The visualized lung regions appear clear. No paraspinous lesions are appreciable.  CT LUMBAR SPINE FINDINGS  There is a nondisplaced fracture of the lateral aspect of the left L1 transverse process. No other fracture is evident. There is no spondylolisthesis. The disc spaces appear normal. No paraspinous lesions are identified. No nerve root edema or effacement. No disc extrusions or stenosis.  IMPRESSION: CT cervical spine: No fracture or spondylolisthesis. No appreciable arthropathy.  CT thoracic spine: No fracture or spondylolisthesis. Mild osteoarthritic change at multiple levels. No disc extrusion or stenosis. No nerve root edema or effacement.  CT lumbar spine: Nondisplaced fracture L1 transverse process on the left. No other evidence of fracture. No spondylolisthesis. No appreciable disc space narrowing. No disc extrusion or stenosis.   Electronically Signed   By: Lowella Grip III M.D.   On: 03/11/2015 09:26    Thoracic Imaging: Thoracic CT wo contrast:  Results for orders placed during the hospital encounter of 03/11/15  CT Thoracic Spine Wo Contrast   Narrative CLINICAL DATA:  Pain following fall  EXAM: CT CERVICAL, THORACIC, AND LUMBAR SPINE WITHOUT  CONTRAST  TECHNIQUE: Multidetector CT imaging of the cervical, thoracic and lumbar spine was performed without intravenous contrast. Multiplanar CT image reconstructions were also generated.  COMPARISON:  None.  FINDINGS: CT CERVICAL SPINE FINDINGS  There is no fracture or spondylolisthesis. Prevertebral soft tissues and predental space regions are normal. The disc spaces appear normal. No nerve root edema or effacement. No disc extrusion or stenosis.  The visualized brain parenchyma appears normal. The visualized mastoid air cells are clear. Visualized lung apices are clear.  CT THORACIC SPINE FINDINGS  There is no demonstrable fracture or spondylolisthesis. There is slight mid thoracic dextroscoliosis. There are multiple anterior osteophytes. There is slight disc space narrowing at several levels in the lower thoracic region. There is mild facet hypertrophy at several levels without nerve root edema or effacement. No disc extrusion or stenosis is evident. The visualized lung regions appear clear. No paraspinous lesions are appreciable.  CT LUMBAR SPINE FINDINGS  There is a nondisplaced fracture of the lateral aspect of the left L1 transverse process. No other fracture is evident. There is no spondylolisthesis. The disc spaces appear normal. No paraspinous lesions are identified. No nerve root edema or effacement. No disc extrusions or stenosis.  IMPRESSION: CT cervical spine: No fracture or spondylolisthesis. No appreciable arthropathy.  CT thoracic spine: No fracture or spondylolisthesis. Mild osteoarthritic change at multiple levels. No disc extrusion or stenosis. No nerve root edema or effacement.  CT lumbar spine: Nondisplaced fracture L1 transverse process on the left. No other evidence of fracture. No spondylolisthesis. No appreciable disc space narrowing. No disc extrusion or stenosis.   Electronically Signed   By: Lowella Grip III M.D.   On:  03/11/2015 09:26    Lumbosacral Imaging: Lumbar MR wo contrast:  Results for orders placed during the hospital  encounter of 09/29/16  MR LUMBAR SPINE WO CONTRAST   Narrative CLINICAL DATA:  [place] down stairs February 2017. LBP with bilateral leg pain. Right leg worse. Pain upper leg mainly. Leg weakness worse after sitting or standing. Problems worse since fall 2017.  EXAM: MRI LUMBAR SPINE WITHOUT CONTRAST  TECHNIQUE: Multiplanar, multisequence MR imaging of the lumbar spine was performed. No intravenous contrast was administered.  COMPARISON:  None.  FINDINGS: Segmentation:  Standard.  Alignment:  Physiologic.  Vertebrae:  No fracture, evidence of discitis, or bone lesion.  Conus medullaris: Extends to the T12 level and appears normal.  Paraspinal and other soft tissues: Negative.  Disc levels:  Disc spaces: Disc desiccation at L5-S1.  T12-L1: No significant disc bulge. No evidence of neural foraminal stenosis. No central canal stenosis.  L1-L2: No significant disc bulge. No evidence of neural foraminal stenosis. No central canal stenosis.  L2-L3: No significant disc bulge. No evidence of neural foraminal stenosis. No central canal stenosis.  L3-L4: No significant disc bulge. No evidence of neural foraminal stenosis. No central canal stenosis.  L4-L5: No significant disc bulge. No evidence of neural foraminal stenosis. No central canal stenosis. Mild bilateral facet arthropathy.  L5-S1: Broad-based disc bulge with a small central disc protrusion. Mild bilateral facet arthropathy. No evidence of neural foraminal stenosis. No central canal stenosis.  IMPRESSION: 1. At L5-S1 there is a broad-based disc bulge with a small central disc protrusion. Mild bilateral facet arthropathy. 2. At L4-5 there is mild bilateral facet arthropathy.   Electronically Signed   By: Kathreen Devoid   On: 09/29/2016 13:54    Lumbar CT wo contrast:  Results for orders placed  during the hospital encounter of 03/11/15  CT Lumbar Spine Wo Contrast   Narrative CLINICAL DATA:  Pain following fall  EXAM: CT CERVICAL, THORACIC, AND LUMBAR SPINE WITHOUT CONTRAST  TECHNIQUE: Multidetector CT imaging of the cervical, thoracic and lumbar spine was performed without intravenous contrast. Multiplanar CT image reconstructions were also generated.  COMPARISON:  None.  FINDINGS: CT CERVICAL SPINE FINDINGS  There is no fracture or spondylolisthesis. Prevertebral soft tissues and predental space regions are normal. The disc spaces appear normal. No nerve root edema or effacement. No disc extrusion or stenosis.  The visualized brain parenchyma appears normal. The visualized mastoid air cells are clear. Visualized lung apices are clear.  CT THORACIC SPINE FINDINGS  There is no demonstrable fracture or spondylolisthesis. There is slight mid thoracic dextroscoliosis. There are multiple anterior osteophytes. There is slight disc space narrowing at several levels in the lower thoracic region. There is mild facet hypertrophy at several levels without nerve root edema or effacement. No disc extrusion or stenosis is evident. The visualized lung regions appear clear. No paraspinous lesions are appreciable.  CT LUMBAR SPINE FINDINGS  There is a nondisplaced fracture of the lateral aspect of the left L1 transverse process. No other fracture is evident. There is no spondylolisthesis. The disc spaces appear normal. No paraspinous lesions are identified. No nerve root edema or effacement. No disc extrusions or stenosis.  IMPRESSION: CT cervical spine: No fracture or spondylolisthesis. No appreciable arthropathy.  CT thoracic spine: No fracture or spondylolisthesis. Mild osteoarthritic change at multiple levels. No disc extrusion or stenosis. No nerve root edema or effacement.  CT lumbar spine: Nondisplaced fracture L1 transverse process on the left. No other evidence  of fracture. No spondylolisthesis. No appreciable disc space narrowing. No disc extrusion or stenosis.   Electronically Signed   By: Gwyndolyn Saxon  Jasmine December III M.D.   On: 03/11/2015 09:26    Sacroiliac Joint Imaging: Sacroiliac Joint DG:  Results for orders placed during the hospital encounter of 05/13/17  DG Si Joints   Narrative CLINICAL DATA:  Bilateral hip pain and pain that radiates into SI joints x 6 months.Pt stated she fell and had a compression fx in 2017  EXAM: [place] - 3+ VIEW  COMPARISON:  None.  FINDINGS: The sacroiliac joint spaces are maintained and there is no evidence of arthropathy. No other bone abnormalities are seen.  IMPRESSION: Negative.   Electronically Signed   By: Lajean Manes M.D.   On: 05/14/2017 08:10    Hip Imaging: Hip-R DG 2-3 views:  Results for orders placed during the hospital encounter of 05/13/17  DG HIP UNILAT W OR W/O PELVIS 2-3 VIEWS RIGHT   Narrative CLINICAL DATA:  Bilateral hip pain and pain that radiates into SI joints x 6 months.Pt stated she fell and had a compression fx in 2017  EXAM: DG HIP (WITH OR WITHOUT PELVIS) 2-3V RIGHT  COMPARISON:  None.  FINDINGS: There is no evidence of hip fracture or dislocation. There is no evidence of arthropathy or other focal bone abnormality.  IMPRESSION: Negative.   Electronically Signed   By: Lajean Manes M.D.   On: 05/14/2017 08:09    Hip-L DG 2-3 views:  Results for orders placed during the hospital encounter of 05/13/17  DG HIP UNILAT W OR W/O PELVIS 2-3 VIEWS LEFT   Narrative CLINICAL DATA:  Bilateral hip pain and pain that radiates into SI joints x 6 months.Pt stated she fell and had a compression fx in 2017  EXAM: DG HIP (WITH OR WITHOUT PELVIS) 2-3V LEFT  COMPARISON:  None.  FINDINGS: There is no evidence of hip fracture or dislocation. There is no evidence of arthropathy or other focal bone  abnormality.  IMPRESSION: Negative.   Electronically Signed   By: Lajean Manes M.D.   On: 05/14/2017 08:09    Complexity Note: Imaging results reviewed. Results shared with Kelly Rollins, using Layman's terms.                         Meds   Current Outpatient Medications:  .  Aspirin-Acetaminophen-Caffeine (EXCEDRIN EXTRA STRENGTH PO), Take 2 tablets by mouth as needed., Disp: , Rfl:  .  budesonide-formoterol (SYMBICORT) 80-4.5 MCG/ACT inhaler, Inhale into the lungs., Disp: , Rfl:  .  cyclobenzaprine (FLEXERIL) 5 MG tablet, Take 1 tablet (5 mg total) by mouth 3 (three) times daily as needed for muscle spasms., Disp: 90 tablet, Rfl: 5 .  metoprolol succinate (TOPROL-XL) 100 MG 24 hr tablet, Take 100 mg by mouth daily. , Disp: , Rfl:  .  Multiple Vitamin (MULTIVITAMIN) tablet, Take 1 tablet by mouth daily., Disp: , Rfl:  .  naproxen (EC NAPROSYN) 500 MG EC tablet, Take 1 tablet (500 mg total) by mouth 2 (two) times daily with a meal., Disp: 60 tablet, Rfl: 0 .  naproxen sodium (ALEVE) 220 MG tablet, Take 220 mg by mouth 2 (two) times daily as needed., Disp: , Rfl:  .  venlafaxine (EFFEXOR) 25 MG tablet, Take 37.5 mg by mouth daily. , Disp: , Rfl:  .  albuterol (PROAIR HFA) 108 (90 Base) MCG/ACT inhaler, Inhale 2 puffs into the lungs every 6 (six) hours as needed. , Disp: , Rfl:  .  Calcium Carbonate-Vit D-Min (GNP CALCIUM 1200) 1200-1000 MG-UNIT CHEW, Chew 1,200 mg by  mouth daily with breakfast. Take in combination with vitamin D and magnesium., Disp: 30 tablet, Rfl: 5 .  Cholecalciferol (VITAMIN D3) 5000 units CAPS, Take 1 capsule (5,000 Units total) by mouth daily with breakfast. Take along with calcium and magnesium., Disp: 30 capsule, Rfl: 5 .  [START ON 05/20/2017] ergocalciferol (VITAMIN D2) 50000 units capsule, Take 1 capsule (50,000 Units total) by mouth 2 (two) times a week. X 6 weeks., Disp: 12 capsule, Rfl: 0 .  esomeprazole (NEXIUM) 40 MG capsule, Take 1 capsule (40 mg total) by  mouth 2 (two) times daily before a meal for 15 days. Take while on ibuprofen., Disp: 30 capsule, Rfl: 0 .  gabapentin (NEURONTIN) 300 MG capsule, Take 1 capsule (300 mg total) by mouth at bedtime., Disp: 30 capsule, Rfl: 5 .  HYDROcodone-acetaminophen (NORCO/VICODIN) 5-325 MG tablet, Take 1 tablet by mouth daily as needed for severe pain., Disp: 30 tablet, Rfl: 0 .  Magnesium 500 MG CAPS, Take 1 capsule (500 mg total) by mouth 2 (two) times daily at 8 am and 10 pm., Disp: 60 capsule, Rfl: 5 .  meloxicam (MOBIC) 15 MG tablet, Take 1 tablet (15 mg total) by mouth daily., Disp: 30 tablet, Rfl: 0 .  metFORMIN (GLUCOPHAGE) 500 MG tablet, Take 500 mg by mouth 2 (two) times daily with a meal. , Disp: , Rfl:  .  omeprazole (PRILOSEC) 20 MG capsule, Take 1 capsule (20 mg total) by mouth daily., Disp: 90 capsule, Rfl: 3  ROS  Constitutional: Denies any fever or chills Gastrointestinal: No reported hemesis, hematochezia, vomiting, or acute GI distress Musculoskeletal: Denies any acute onset joint swelling, redness, loss of ROM, or weakness Neurological: No reported episodes of acute onset apraxia, aphasia, dysarthria, agnosia, amnesia, paralysis, loss of coordination, or loss of consciousness  Allergies  Kelly Rollins is allergic to atorvastatin; levofloxacin; morphine; penicillins; and tramadol hcl.  [place]  Drug: Kelly Rollins  reports that she does not use drugs. Alcohol:  reports that she drinks alcohol. Tobacco:  reports that she quit smoking about 2 years ago. Her smoking use included cigarettes. She has never used smokeless tobacco. Medical:  has a past medical history of ABDOMINAL PAIN OTHER SPECIFIED SITE (02/21/2009), ABDOMINAL PAIN-LUQ (04/17/2009), Anal fissure, Asthma, Diabetes mellitus without complication ([place]), [id number](829.5), HEMATOCHEZIA (04/17/2009), Hypertension, IBS (irritable bowel syndrome), Migraine headache (11/06/2008), OVARIAN CYST, RIGHT (02/21/2009), and PALPITATIONS, OCCASIONAL  (04/17/2010). Surgical: Kelly Rollins  has a past surgical history that includes Cesarean section; Vaginal hysterectomy; Cholecystectomy; and Breast reduction surgery. Family: family history includes Cancer in her mother; Heart disease in her father; Hyperlipidemia in her father and mother; Hypertension in her father and mother.  Constitutional Exam  General appearance: Well nourished, well developed, and well hydrated. In no apparent acute distress Vitals:   05/19/17 0826  BP: (!) 177/100  Pulse: 82  Resp: 18  Temp: 97.7 F (36.5 C)  TempSrc: Oral  SpO2: 99%  Weight: 189 lb (85.7 kg)  Height: '5\' 4"'  (1.626 m)   BMI Assessment: Estimated body mass index is 32.44 kg/m as calculated from the following:   Height as of this encounter: '5\' 4"'  (1.626 m).   Weight as of this encounter: 189 lb (85.7 kg).  BMI interpretation table: BMI level Category Range association with higher incidence of chronic pain  <18 kg/m2 Underweight   18.5-24.9 kg/m2 Ideal body weight   25-29.9 kg/m2 Overweight Increased incidence by 20%  30-34.9 kg/m2 Obese (Class I) Increased incidence by 68%  35-39.9 kg/m2 Severe obesity (Class  II) Increased incidence by 136%  >40 kg/m2 Extreme obesity (Class III) Increased incidence by 254%   BMI Readings from Last 4 Encounters:  05/19/17 32.44 kg/m  05/06/17 31.24 kg/m  04/14/17 31.76 kg/m  02/22/17 32.06 kg/m   Wt Readings from Last 4 Encounters:  05/19/17 189 lb (85.7 kg)  05/06/17 182 lb (82.6 kg)  04/14/17 185 lb (83.9 kg)  02/22/17 186 lb 12.8 oz (84.7 kg)  Psych/Mental status: Alert, oriented x 3 (person, place, & time)       Eyes: PERLA Respiratory: No evidence of acute respiratory distress  Cervical Spine Area Exam  Skin & Axial Inspection: No masses, redness, edema, swelling, or associated skin lesions Alignment: Symmetrical Functional ROM: Unrestricted ROM      Stability: No instability detected Muscle Tone/Strength: Functionally intact. No obvious  neuro-muscular anomalies detected. Sensory (Neurological): Unimpaired Palpation: No palpable anomalies              Upper Extremity (UE) Exam    Side: Right upper extremity  Side: Left upper extremity  Skin & Extremity Inspection: Skin color, temperature, and hair growth are WNL. No peripheral edema or cyanosis. No masses, redness, swelling, asymmetry, or associated skin lesions. No contractures.  Skin & Extremity Inspection: Skin color, temperature, and hair growth are WNL. No peripheral edema or cyanosis. No masses, redness, swelling, asymmetry, or associated skin lesions. No contractures.  Functional ROM: Unrestricted ROM          Functional ROM: Unrestricted ROM          Muscle Tone/Strength: Functionally intact. No obvious neuro-muscular anomalies detected.  Muscle Tone/Strength: Functionally intact. No obvious neuro-muscular anomalies detected.  Sensory (Neurological): Unimpaired          Sensory (Neurological): Unimpaired          Palpation: No palpable anomalies              Palpation: No palpable anomalies              Specialized Test(s): Deferred         Specialized Test(s): Deferred          Thoracic Spine Area Exam  Skin & Axial Inspection: No masses, redness, or swelling Alignment: Symmetrical Functional ROM: Unrestricted ROM Stability: No instability detected Muscle Tone/Strength: Functionally intact. No obvious neuro-muscular anomalies detected. Sensory (Neurological): Unimpaired Muscle strength & Tone: No palpable anomalies  Lumbar Spine Area Exam  Skin & Axial Inspection: No masses, redness, or swelling Alignment: Symmetrical Functional ROM: Decreased ROM       Stability: No instability detected Muscle Tone/Strength: Functionally intact. No obvious neuro-muscular anomalies detected. Sensory (Neurological): Movement-associated pain Palpation: Complains of area being tender to palpation       Provocative Tests: Lumbar Hyperextension and rotation test: evaluation  deferred today       Lumbar Lateral bending test: evaluation deferred today       Patrick's Maneuver: evaluation deferred today                    Gait & Posture Assessment  Ambulation: Unassisted Gait: Relatively normal for age and body habitus Posture: WNL   Lower Extremity Exam    Side: Right lower extremity  Side: Left lower extremity  Skin & Extremity Inspection: Skin color, temperature, and hair growth are WNL. No peripheral edema or cyanosis. No masses, redness, swelling, asymmetry, or associated skin lesions. No contractures.  Skin & Extremity Inspection: Skin color, temperature, and hair growth are WNL. No peripheral edema  or cyanosis. No masses, redness, swelling, asymmetry, or associated skin lesions. No contractures.  Functional ROM: Unrestricted ROM          Functional ROM: Unrestricted ROM          Muscle Tone/Strength: Functionally intact. No obvious neuro-muscular anomalies detected.  Muscle Tone/Strength: Functionally intact. No obvious neuro-muscular anomalies detected.  Sensory (Neurological): Unimpaired  Sensory (Neurological): Unimpaired  Palpation: No palpable anomalies  Palpation: No palpable anomalies   Assessment  Primary Diagnosis & Pertinent Problem List: The primary encounter diagnosis was Chronic low back pain (Primary Source of Pain) (Bilateral) (L>R). Diagnoses of Spondylosis without myelopathy or radiculopathy, lumbosacral region, Chronic hip pain (Secondary source of pain) (Bilateral) (L>R), Chronic knee pain (Tertiary source of pain) (Bilateral) (L>R), Lumbar facet syndrome (Bilateral) (L>R), Lumbar transverse process fracture (HCC) (Left L1) (03/11/15), Neurogenic pain, Chronic pain syndrome, Elevated C-reactive protein (CRP), Elevated sed rate, Vitamin D insufficiency, and Gastroesophageal reflux disease with esophagitis were also pertinent to this visit.  Status Diagnosis  Worsening Worsening Worsening 1. Chronic low back pain (Primary Source of Pain)  (Bilateral) (L>R)   2. Spondylosis without myelopathy or radiculopathy, lumbosacral region   3. Chronic hip pain (Secondary source of pain) (Bilateral) (L>R)   4. Chronic knee pain [facility] source of pain) (Bilateral) (L>R)   5. Lumbar facet syndrome (Bilateral) (L>R)   6. Lumbar transverse process fracture (HCC) (Left L1) (03/11/15)   7. Neurogenic pain   8. Chronic pain syndrome   9. Elevated C-reactive protein (CRP)   10. Elevated sed rate   11. Vitamin D insufficiency   12. Gastroesophageal reflux disease with esophagitis     Problems updated and reviewed during this visit: Problem  Chronic hip pain (Secondary source of pain) (Bilateral) (L>R)  Spondylosis Without Myelopathy Or Radiculopathy, Lumbosacral Region  Chest Pain With Moderate Risk of Acute Coronary Syndrome  Lumbar Radiculopathy  Lumbar transverse process fracture (HCC) (Left L1) (03/11/15)  Chronic low back pain (Primary Source of Pain) (Bilateral) (L>R)  Lumbar facet syndrome (Bilateral) (L>R)  Chronic knee pain (Tertiary source of pain) (Bilateral) (L>R)  Elevated C-Reactive Protein (Crp)  Elevated Sed Rate  Pharmacologic Therapy  Disorder of Skeletal System  Problems Influencing Health Status  Vitamin D Insufficiency  Doe (Dyspnea On Exertion)  Intermittent Palpitations  Depression  Diabetes Mellitus Type 2, Uncomplicated (Hcc)  Fatty Liver   Overview:  with elevation in alkaline phosphate 39   Htn (Hypertension), Benign  Essential Hypertriglyceridemia  Hyperlipidemia   By: Joesph Fillers CMA (AAMA), Natasha     Fatty liver disease   By: Deborra Medina MD, Talia     Pure Hypercholesterolemia   By: Joesph Fillers CMA (AAMA), Natasha     GERD   By: Fuller Plan MD Lamont Snowball T    Constipation   By: Fuller Plan MD Marijo Conception     Plan of Care  Pharmacotherapy (Medications Ordered): Meds ordered this encounter  Medications  . ergocalciferol (VITAMIN D2) 50000 units capsule    Sig: Take 1 capsule (50,000 Units total)  by mouth 2 (two) times a week. X 6 weeks.    Dispense:  12 capsule    Refill:  0    Do not add this medication to the electronic "Automatic Refill" notification system. Patient may have prescription filled one day early if pharmacy is closed on scheduled refill date.  . Cholecalciferol (VITAMIN D3) 5000 units CAPS    Sig: Take 1 capsule (5,000 Units total) by mouth daily with breakfast. Take along with calcium and  magnesium.    Dispense:  30 capsule    Refill:  5    Do not place medication on "Automatic Refill".  May substitute with similar over-the-counter product.  . Magnesium 500 MG CAPS    Sig: Take 1 capsule (500 mg total) by mouth 2 (two) times daily at 8 am and 10 pm.    Dispense:  60 capsule    Refill:  5    Do not place medication on "Automatic Refill".  The patient may use similar over-the-counter product.  . Calcium Carbonate-Vit D-Min (GNP CALCIUM 1200) 1200-1000 MG-UNIT CHEW    Sig: Chew 1,200 mg by mouth daily with breakfast. Take in combination with vitamin D and magnesium.    Dispense:  30 tablet    Refill:  5    Do not place medication on "Automatic Refill".  May substitute with similar over-the-counter product.  . meloxicam (MOBIC) 15 MG tablet    Sig: Take 1 tablet (15 mg total) by mouth daily.    Dispense:  30 tablet    Refill:  0    Do not add this medication to the electronic "Automatic Refill" notification system. Patient may have prescription filled one day early if pharmacy is closed on scheduled refill date.  . esomeprazole (NEXIUM) 40 MG capsule    Sig: Take 1 capsule (40 mg total) by mouth 2 (two) times daily before a meal for 15 days. Take while on ibuprofen.    Dispense:  30 capsule    Refill:  0  . cyclobenzaprine (FLEXERIL) 5 MG tablet    Sig: Take 1 tablet (5 mg total) by mouth 3 (three) times daily as needed for muscle spasms.    Dispense:  90 tablet    Refill:  5    Do not place this medication, or any other prescription from our practice, on  "Automatic Refill". Patient may have prescription filled one day early if pharmacy is closed on scheduled refill date.  . gabapentin (NEURONTIN) 300 MG capsule    Sig: Take 1 capsule (300 mg total) by mouth at bedtime.    Dispense:  30 capsule    Refill:  5    Do not place this medication, or any other prescription from our practice, on "Automatic Refill". Patient may have prescription filled one day early if pharmacy is closed on scheduled refill date.  Marland Kitchen omeprazole (PRILOSEC) 20 MG capsule    Sig: Take 1 capsule (20 mg total) by mouth daily.    Dispense:  90 capsule    Refill:  3  . HYDROcodone-acetaminophen (NORCO/VICODIN) 5-325 MG tablet    Sig: Take 1 tablet by mouth daily as needed for severe pain.    Dispense:  30 tablet    Refill:  0    Do not place this medication, or any other prescription from our practice, on "Automatic Refill". Patient may have prescription filled one day early if pharmacy is closed on scheduled refill date. Do not fill until: 05/19/17 To last until: 08/17/17   Medications administered today: Courtney Heys had no medications administered during this visit.   Procedure Orders     LUMBAR FACET(MEDIAL BRANCH NERVE BLOCK) MBNB  Lab Orders     Rheumatoid factor     ANA w/Reflex if Positive     Uric acid, random urine Imaging Orders  No imaging studies ordered today   Referral Orders  No referral(s) requested today    Interventional management options: Planned, scheduled, and/or pending:   Diagnostic  bilateral lumbar facet block #1     Considering:   Diagnostic bilateral IA hip joint injection  Palliative bilateral lumbar facet block   Bilateral lumbar facet radiofrequency ablation. (Last done: Right [06/23/2016]; Left [06/08/2016])   Palliative PRN treatment(s):   Diagnostic bilateral hip joint injection  Palliative bilateral lumbar facet block plus left L1 transverse process injection    Provider-requested follow-up: Return for Procedure  (w/ sedation): (B) L-FCT BLK.  Future Appointments  Date Time Provider [place]  05/24/2017 10:00 AM Allyne Gee, MD [place] None   Primary Care Physician: Leonel Ramsay, MD Location: [facility] Note by: Gaspar Cola, MD Date: 05/19/2017; Time: 10:18 AM

## 2017-05-19 NOTE — Progress Notes (Signed)
Safety precautions to be maintained throughout the outpatient stay will include: orient to surroundings, keep bed in low position, maintain call bell within reach at all times, provide assistance with transfer out of bed and ambulation.  

## 2017-05-24 ENCOUNTER — Encounter: Payer: Self-pay | Admitting: Internal Medicine

## 2017-05-24 ENCOUNTER — Ambulatory Visit: Payer: BLUE CROSS/BLUE SHIELD | Admitting: Internal Medicine

## 2017-05-24 VITALS — BP 140/100 | HR 77 | Resp 16 | Ht 64.0 in | Wt 190.8 lb

## 2017-05-24 DIAGNOSIS — G4733 Obstructive sleep apnea (adult) (pediatric): Secondary | ICD-10-CM | POA: Diagnosis not present

## 2017-05-24 DIAGNOSIS — Z87891 Personal history of nicotine dependence: Secondary | ICD-10-CM

## 2017-05-24 DIAGNOSIS — I493 Ventricular premature depolarization: Secondary | ICD-10-CM | POA: Diagnosis not present

## 2017-05-24 DIAGNOSIS — R0602 Shortness of breath: Secondary | ICD-10-CM

## 2017-05-24 DIAGNOSIS — K219 Gastro-esophageal reflux disease without esophagitis: Secondary | ICD-10-CM

## 2017-05-24 NOTE — Patient Instructions (Signed)

## 2017-05-24 NOTE — Progress Notes (Signed)
[facility] [place], [place] [address]  Pulmonary Sleep Medicine   Office Visit Note  Patient Name: Kelly Rollins DOB: October 17, 1970 MRN [id number]  Date of Service: 05/24/2017  Complaints/HPI:  She is here for follow up of sleep apnea.  She had a sleep study done back in February and this showed that she did have mild obstructive sleep apnea-hypopnea syndrome.  Patient did not have significant desaturations noted during this study.  She does feel extremely tired during the daytime and she feels that this is interfering with her normal life.  She also admits to having some headaches on occasion  ROS  General: (-) fever, (-) chills, (-) night sweats, (-) weakness Skin: (-) rashes, (-) itching,. Eyes: (-) visual changes, (-) redness, (-) itching. Nose and Sinuses: (-) nasal stuffiness or itchiness, (-) postnasal drip, (-) nosebleeds, (-) sinus trouble. Mouth and Throat: (-) sore throat, (-) hoarseness. Neck: (-) swollen glands, (-) enlarged thyroid, (-) neck pain. Respiratory: - cough, (-) bloody sputum, - shortness of breath, - wheezing. Cardiovascular: - ankle swelling, (-) chest pain. Lymphatic: (-) lymph node enlargement. Neurologic: (-) numbness, (-) tingling. Psychiatric: (-) anxiety, (-) depression   Current Medication: Outpatient Encounter Medications as of 05/24/2017  Medication Sig  . albuterol (PROAIR HFA) 108 (90 Base) MCG/ACT inhaler Inhale 2 puffs into the lungs every 6 (six) hours as needed.   . Aspirin-Acetaminophen-Caffeine (EXCEDRIN EXTRA STRENGTH PO) Take 2 tablets by mouth as needed.  . budesonide-formoterol (SYMBICORT) 80-4.5 MCG/ACT inhaler Inhale into the lungs.  . Calcium Carbonate-Vit D-Min (GNP CALCIUM 1200) 1200-1000 MG-UNIT CHEW Chew 1,200 mg by mouth daily with breakfast. Take in combination with vitamin D and magnesium.  . Cholecalciferol (VITAMIN D3) 5000 units CAPS Take 1 capsule (5,000 Units total) by mouth daily with  breakfast. Take along with calcium and magnesium.  . cyclobenzaprine (FLEXERIL) 5 MG tablet Take 1 tablet (5 mg total) by mouth 3 (three) times daily as needed for muscle spasms.  . ergocalciferol (VITAMIN D2) 50000 units capsule Take 1 capsule (50,000 Units total) by mouth 2 (two) times a week. X 6 weeks.  Marland Kitchen esomeprazole (NEXIUM) 40 MG capsule Take 1 capsule (40 mg total) by mouth 2 (two) times daily before a meal for 15 days. Take while on ibuprofen.  . gabapentin (NEURONTIN) 300 MG capsule Take 1 capsule (300 mg total) by mouth at bedtime.  Marland Kitchen HYDROcodone-acetaminophen (NORCO/VICODIN) 5-325 MG tablet Take 1 tablet by mouth daily as needed for severe pain.  . Magnesium 500 MG CAPS Take 1 capsule (500 mg total) by mouth 2 (two) times daily at 8 am and 10 pm.  . meloxicam (MOBIC) 15 MG tablet Take 1 tablet (15 mg total) by mouth daily.  . metFORMIN (GLUCOPHAGE) 500 MG tablet Take 500 mg by mouth 2 (two) times daily with a meal.   . metoprolol succinate (TOPROL-XL) 100 MG 24 hr tablet Take 100 mg by mouth daily.   . Multiple Vitamin (MULTIVITAMIN) tablet Take 1 tablet by mouth daily.  . naproxen (EC NAPROSYN) 500 MG EC tablet Take 1 tablet (500 mg total) by mouth 2 (two) times daily with a meal.  . naproxen sodium (ALEVE) 220 MG tablet Take 220 mg by mouth 2 (two) times daily as needed.  Marland Kitchen omeprazole (PRILOSEC) 20 MG capsule Take 1 capsule (20 mg total) by mouth daily.  Marland Kitchen venlafaxine (EFFEXOR) 25 MG tablet Take 37.5 mg by mouth daily.    No facility-administered encounter medications on file as  of 05/24/2017.     Surgical History: Past Surgical History:  Procedure Laterality Date  . BREAST REDUCTION SURGERY     bilateral  . CESAREAN SECTION    . CHOLECYSTECTOMY    . VAGINAL HYSTERECTOMY      Medical History: Past Medical History:  Diagnosis Date  . ABDOMINAL PAIN OTHER SPECIFIED SITE 02/21/2009   Qualifier: Diagnosis of  By: Deborra Medina MD, Tanja Port    . ABDOMINAL PAIN-LUQ 04/17/2009   Qualifier:  Diagnosis of  By: Fuller Plan MD Marijo Conception Anal fissure   . Asthma   . Diabetes mellitus without complication ([place])   . Headache(784.0)   . HEMATOCHEZIA 04/17/2009   Qualifier: Diagnosis of  By: Fuller Plan MD Marijo Conception Hypertension   . IBS (irritable bowel syndrome)   . Migraine headache 11/06/2008   Qualifier: Diagnosis of  By: Deborra Medina MD, Tanja Port    . OVARIAN CYST, RIGHT 02/21/2009   Qualifier: Diagnosis of  By: Deborra Medina MD, Tanja Port    . PALPITATIONS, OCCASIONAL 04/17/2010   Qualifier: Diagnosis of  By: Deborra Medina MD, Talia      Family History: Family History  Problem Relation Age of Onset  . Cancer Mother        throat in the 13's  . Hyperlipidemia Mother   . Hypertension Mother   . Hyperlipidemia Father   . Hypertension Father   . Heart disease Father        S/P two stents    Social History: Social History   Socioeconomic History  . Marital status: Married    Spouse name: Not on file  . Number of children: 3  . Years of education: Not on file  . Highest education level: Not on file  Occupational History  . Occupation: Self Employed, Sports coach: SELF-EMPLOYED  Social Needs  . Financial resource strain: Not on file  . Food insecurity:    Worry: Not on file    Inability: Not on file  . Transportation needs:    Medical: Not on file    Non-medical: Not on file  Tobacco Use  . Smoking status: Former Smoker    Types: Cigarettes    Last attempt to quit: 09/03/2014    Years since quitting: 2.7  . Smokeless tobacco: Never Used  Substance and Sexual Activity  . Alcohol use: Yes    Comment: occassional  . Drug use: No  . Sexual activity: Yes  Lifestyle  . Physical activity:    Days per week: Not on file    Minutes per session: Not on file  . Stress: Not on file  Relationships  . Social connections:    Talks on phone: Not on file    Gets together: Not on file    Attends religious service: Not on file    Active member of club or organization:  Not on file    Attends meetings of clubs or organizations: Not on file    Relationship status: Not on file  . Intimate partner violence:    Fear of current or ex partner: Not on file    Emotionally abused: Not on file    Physically abused: Not on file    Forced sexual activity: Not on file  Other Topics Concern  . Not on file  Social History Narrative   Lives with husband and 3 daughters, 29, 23, and 7    Vital Signs: Blood pressure (!) 140/100, pulse 77, resp. rate  16, height 5\' 4"  (1.626 m), weight 190 lb 12.8 oz (86.5 kg), SpO2 98 %.  Examination: General Appearance: The patient is well-developed, well-nourished, and in no distress. Skin: Gross inspection of skin unremarkable. Head: normocephalic, no gross deformities. Eyes: no gross deformities noted. ENT: ears appear grossly normal no exudates. Neck: Supple. No thyromegaly. No LAD. Respiratory: no rhonchi. Cardiovascular: Normal S1 and S2 without murmur or rub. Extremities: No cyanosis. pulses are equal. Neurologic: Alert and oriented. No involuntary movements.  LABS: Recent Results (from the past 2160 hour(s))  Comp. Metabolic Panel (12)     Status: Abnormal   Collection Time: 05/06/17  9:50 AM  Result Value Ref Range   Glucose 237 (H) 65 - 99 mg/dL   BUN 10 6 - 24 mg/dL   Creatinine, Ser 0.72 0.57 - 1.00 mg/dL   GFR calc non Af Amer 101 >59 mL/min/1.73   GFR calc Af Amer 117 >59 mL/min/1.73   BUN/Creatinine Ratio 14 9 - 23   Sodium 135 134 - 144 mmol/L   Potassium 4.6 3.5 - 5.2 mmol/L   Chloride 95 (L) 96 - 106 mmol/L   Calcium 9.6 8.7 - 10.2 mg/dL   Total Protein 7.1 6.0 - 8.5 g/dL   Albumin 4.1 3.5 - 5.5 g/dL   Globulin, Total 3.0 1.5 - 4.5 g/dL   Albumin/Globulin Ratio 1.4 1.2 - 2.2   Bilirubin Total <0.2 0.0 - 1.2 mg/dL   Alkaline Phosphatase 87 39 - 117 IU/L   AST 30 0 - 40 IU/L  Vitamin B12     Status: None   Collection Time: 05/06/17  9:50 AM  Result Value Ref Range   Vitamin B-12 828 232 - 1,245  pg/mL  Magnesium     Status: None   Collection Time: 05/06/17  9:50 AM  Result Value Ref Range   Magnesium 2.0 1.6 - 2.3 mg/dL  Sedimentation rate     Status: Abnormal   Collection Time: 05/06/17  9:50 AM  Result Value Ref Range   Sed Rate 33 (H) 0 - 32 mm/hr  25-Hydroxyvitamin D Lcms D2+D3     Status: Abnormal   Collection Time: 05/06/17  9:50 AM  Result Value Ref Range   25-Hydroxy, Vitamin D 22 (L) ng/mL    Comment: Reference Range: All Ages: Target levels 30 - 100    25-Hydroxy, Vitamin D-2 2.9 ng/mL   25-Hydroxy, Vitamin D-3 19 ng/mL  C-reactive protein     Status: Abnormal   Collection Time: 05/06/17  9:50 AM  Result Value Ref Range   CRP 5.6 (H) 0.0 - 4.9 mg/L    Radiology: Dg Si Joints  Result Date: 05/14/2017 CLINICAL DATA:  Bilateral hip pain and pain that radiates into SI joints x 6 months.Pt stated she fell and had a compression fx in 2017 EXAM: [place] - 3+ VIEW COMPARISON:  None. FINDINGS: The sacroiliac joint spaces are maintained and there is no evidence of arthropathy. No other bone abnormalities are seen. IMPRESSION: Negative. Electronically Signed   By: Lajean Manes M.D.   On: 05/14/2017 08:10   Dg Hip Unilat W Or W/o Pelvis 2-3 Views Left  Result Date: 05/14/2017 CLINICAL DATA:  Bilateral hip pain and pain that radiates into SI joints x 6 months.Pt stated she fell and had a compression fx in 2017 EXAM: DG HIP (WITH OR WITHOUT PELVIS) 2-3V LEFT COMPARISON:  None. FINDINGS: There is no evidence of hip fracture or dislocation. There is no evidence of arthropathy or  other focal bone abnormality. IMPRESSION: Negative. Electronically Signed   By: Lajean Manes M.D.   On: 05/14/2017 08:09   Dg Hip Unilat W Or W/o Pelvis 2-3 Views Right  Result Date: 05/14/2017 CLINICAL DATA:  Bilateral hip pain and pain that radiates into SI joints x 6 months.Pt stated she fell and had a compression fx in 2017 EXAM: DG HIP (WITH OR WITHOUT PELVIS) 2-3V RIGHT  COMPARISON:  None. FINDINGS: There is no evidence of hip fracture or dislocation. There is no evidence of arthropathy or other focal bone abnormality. IMPRESSION: Negative. Electronically Signed   By: Lajean Manes M.D.   On: 05/14/2017 08:09    No results found.  Dg Si Joints  Result Date: 05/14/2017 CLINICAL DATA:  Bilateral hip pain and pain that radiates into SI joints x 6 months.Pt stated she fell and had a compression fx in 2017 EXAM: [place] - 3+ VIEW COMPARISON:  None. FINDINGS: The sacroiliac joint spaces are maintained and there is no evidence of arthropathy. No other bone abnormalities are seen. IMPRESSION: Negative. Electronically Signed   By: Lajean Manes M.D.   On: 05/14/2017 08:10   Dg Hip Unilat W Or W/o Pelvis 2-3 Views Left  Result Date: 05/14/2017 CLINICAL DATA:  Bilateral hip pain and pain that radiates into SI joints x 6 months.Pt stated she fell and had a compression fx in 2017 EXAM: DG HIP (WITH OR WITHOUT PELVIS) 2-3V LEFT COMPARISON:  None. FINDINGS: There is no evidence of hip fracture or dislocation. There is no evidence of arthropathy or other focal bone abnormality. IMPRESSION: Negative. Electronically Signed   By: Lajean Manes M.D.   On: 05/14/2017 08:09   Dg Hip Unilat W Or W/o Pelvis 2-3 Views Right  Result Date: 05/14/2017 CLINICAL DATA:  Bilateral hip pain and pain that radiates into SI joints x 6 months.Pt stated she fell and had a compression fx in 2017 EXAM: DG HIP (WITH OR WITHOUT PELVIS) 2-3V RIGHT COMPARISON:  None. FINDINGS: There is no evidence of hip fracture or dislocation. There is no evidence of arthropathy or other focal bone abnormality. IMPRESSION: Negative. Electronically Signed   By: Lajean Manes M.D.   On: 05/14/2017 08:09      Assessment and Plan: Patient Active Problem List   Diagnosis Date Noted  . Chronic hip pain (Secondary source of pain) (Bilateral) (L>R) 05/19/2017  . Elevated C-reactive protein (CRP)  05/19/2017  . Elevated sed rate 05/19/2017  . Spondylosis without myelopathy or radiculopathy, lumbosacral region 05/19/2017  . Pharmacologic therapy 05/06/2017  . Disorder of skeletal system 05/06/2017  . Problems influencing health status 05/06/2017  . Chest pain with moderate risk of acute coronary syndrome 04/21/2017  . DOE (dyspnea on exertion) 04/21/2017  . Intermittent palpitations 04/21/2017  . Lumbar radiculopathy 09/08/2016  . Acute postoperative pain 06/08/2016  . Vitamin D insufficiency 05/04/2016  . Depression 02/04/2016  . Diabetes mellitus type 2, uncomplicated ([place]) 71/07/2692  . Fatty liver 02/04/2016  . HTN (hypertension), benign 02/04/2016  . Chronic pain syndrome 01/15/2016  . Acute low back pain 05/28/2015  . Lumbar transverse process fracture ([place]) (Left L1) (03/11/15) 05/28/2015  . Long term current use of opiate analgesic 05/01/2015  . Long term prescription opiate use 05/01/2015  . Opiate use (25 MME/Day) 05/01/2015  . Encounter for therapeutic drug level monitoring 05/01/2015  . Encounter for pain management planning 05/01/2015  . Chronic low back pain (Primary Source of Pain) (Bilateral) (L>R) 05/01/2015  . Lumbar facet  syndrome (Bilateral) (L>R) 05/01/2015  . Chronic knee pain [facility] source of pain) (Bilateral) (L>R) 05/01/2015  . Lumbar spondylosis 05/01/2015  . Neuropathic pain 05/01/2015  . Neurogenic pain 05/01/2015  . Myofascial pain 05/01/2015  . Essential hypertriglyceridemia 04/26/2014  . Anxiety 05/15/2010  . Obesity 05/15/2010  . Hyperlipidemia 04/03/2010  . Fatty liver disease 04/02/2010  . Pure hypercholesterolemia 03/19/2010  . GERD 04/17/2009  . Constipation 04/17/2009  . TOBACCO ABUSE 11/06/2008    1. OSA mild sleep apnea noted with an index of 9.9 per hour however she is significantly symptomatic.  I am going to get her back in for a proper CPAP titration study to be done.  We will get this scheduled. 2. Nicotine use no longer  smoking  She states that she quit smoking a while back and now she on occasion will have a cigarette with her family members 3. GERD under controlled at this time 4. Morbid Obesity discussed weight loss 5. Palpitations PVCs she saw per cardiologist for follow-up she does not require any further cardiac intervention  General Counseling: I have discussed the findings of the evaluation and examination with [facility].  I have also discussed any further diagnostic evaluation thatmay be needed or ordered today. Jadon verbalizes understanding of the findings of todays visit. We also reviewed her medications today and discussed drug interactions and side effects including but not limited excessive drowsiness and altered mental states. We also discussed that there is always a risk not just to her but also people around her. she has been encouraged to call the office with any questions or concerns that should arise related to todays visit.    Time spent: 2min  I have personally obtained a history, examined the patient, evaluated laboratory and imaging results, formulated the assessment and plan and placed orders.    Allyne Gee, MD [facility] Pulmonary and Critical Care Sleep medicine

## 2017-06-23 ENCOUNTER — Telehealth: Payer: Self-pay | Admitting: Internal Medicine

## 2017-06-23 NOTE — Telephone Encounter (Signed)
Left message and asked pt to call and schedule her cpap titration in lab bc it has been approved. Beth

## 2017-06-24 ENCOUNTER — Ambulatory Visit: Payer: Self-pay | Admitting: Internal Medicine

## 2017-07-26 ENCOUNTER — Encounter: Payer: Self-pay | Admitting: Internal Medicine

## 2017-07-27 ENCOUNTER — Encounter: Payer: Self-pay | Admitting: Internal Medicine

## 2017-08-10 ENCOUNTER — Encounter: Payer: Self-pay | Admitting: Internal Medicine

## 2017-08-12 ENCOUNTER — Ambulatory Visit: Payer: Self-pay | Admitting: Internal Medicine

## 2017-08-17 ENCOUNTER — Ambulatory Visit: Payer: Self-pay | Admitting: Internal Medicine

## 2017-10-14 ENCOUNTER — Inpatient Hospital Stay
Admission: EM | Admit: 2017-10-14 | Discharge: 2017-10-27 | DRG: 439 | Disposition: A | Discharge status: Home / Self Care | Payer: Medicaid Other | Attending: Internal Medicine | Admitting: Internal Medicine

## 2017-10-14 ENCOUNTER — Emergency Department: Payer: Medicaid Other

## 2017-10-14 ENCOUNTER — Encounter: Payer: Self-pay | Admitting: *Deleted

## 2017-10-14 ENCOUNTER — Other Ambulatory Visit: Payer: Self-pay

## 2017-10-14 DIAGNOSIS — E860 Dehydration: Secondary | ICD-10-CM | POA: Diagnosis present

## 2017-10-14 DIAGNOSIS — E781 Pure hyperglyceridemia: Secondary | ICD-10-CM | POA: Diagnosis present

## 2017-10-14 DIAGNOSIS — Z7984 Long term (current) use of oral hypoglycemic drugs: Secondary | ICD-10-CM | POA: Diagnosis not present

## 2017-10-14 DIAGNOSIS — E8881 Metabolic syndrome: Secondary | ICD-10-CM | POA: Diagnosis present

## 2017-10-14 DIAGNOSIS — Z90710 Acquired absence of both cervix and uterus: Secondary | ICD-10-CM | POA: Diagnosis not present

## 2017-10-14 DIAGNOSIS — E1165 Type 2 diabetes mellitus with hyperglycemia: Secondary | ICD-10-CM | POA: Diagnosis present

## 2017-10-14 DIAGNOSIS — E1140 Type 2 diabetes mellitus with diabetic neuropathy, unspecified: Secondary | ICD-10-CM | POA: Diagnosis present

## 2017-10-14 DIAGNOSIS — E785 Hyperlipidemia, unspecified: Secondary | ICD-10-CM | POA: Diagnosis present

## 2017-10-14 DIAGNOSIS — Z87891 Personal history of nicotine dependence: Secondary | ICD-10-CM | POA: Diagnosis not present

## 2017-10-14 DIAGNOSIS — R1012 Left upper quadrant pain: Secondary | ICD-10-CM | POA: Diagnosis not present

## 2017-10-14 DIAGNOSIS — Z79899 Other long term (current) drug therapy: Secondary | ICD-10-CM

## 2017-10-14 DIAGNOSIS — Z7951 Long term (current) use of inhaled steroids: Secondary | ICD-10-CM

## 2017-10-14 DIAGNOSIS — M549 Dorsalgia, unspecified: Secondary | ICD-10-CM | POA: Diagnosis present

## 2017-10-14 DIAGNOSIS — J45909 Unspecified asthma, uncomplicated: Secondary | ICD-10-CM | POA: Diagnosis present

## 2017-10-14 DIAGNOSIS — E871 Hypo-osmolality and hyponatremia: Secondary | ICD-10-CM | POA: Diagnosis present

## 2017-10-14 DIAGNOSIS — R109 Unspecified abdominal pain: Secondary | ICD-10-CM

## 2017-10-14 DIAGNOSIS — G8929 Other chronic pain: Secondary | ICD-10-CM | POA: Diagnosis present

## 2017-10-14 DIAGNOSIS — F329 Major depressive disorder, single episode, unspecified: Secondary | ICD-10-CM | POA: Diagnosis present

## 2017-10-14 DIAGNOSIS — I10 Essential (primary) hypertension: Secondary | ICD-10-CM | POA: Diagnosis present

## 2017-10-14 DIAGNOSIS — Z9049 Acquired absence of other specified parts of digestive tract: Secondary | ICD-10-CM

## 2017-10-14 DIAGNOSIS — K589 Irritable bowel syndrome without diarrhea: Secondary | ICD-10-CM | POA: Diagnosis present

## 2017-10-14 DIAGNOSIS — K858 Other acute pancreatitis without necrosis or infection: Secondary | ICD-10-CM | POA: Diagnosis present

## 2017-10-14 DIAGNOSIS — R140 Abdominal distension (gaseous): Secondary | ICD-10-CM

## 2017-10-14 DIAGNOSIS — K859 Acute pancreatitis without necrosis or infection, unspecified: Secondary | ICD-10-CM

## 2017-10-14 HISTORY — DX: Acute pancreatitis without necrosis or infection, unspecified: K85.90

## 2017-10-14 LAB — CBC
HCT: 36.5 % (ref 35.0–47.0)
HEMOGLOBIN: 11.9 g/dL — AB (ref 12.0–16.0)
MCH: 38.5 pg — AB (ref 26.0–34.0)
MCHC: 32.6 g/dL (ref 32.0–36.0)
MCV: 85.8 fL (ref 80.0–100.0)
Platelets: 363 10*3/uL (ref 150–440)
RBC: 4.26 MIL/uL (ref 3.80–5.20)
RDW: 15 % — AB (ref 11.5–14.5)
WBC: 12.4 10*3/uL — ABNORMAL HIGH (ref 3.6–11.0)

## 2017-10-14 LAB — COMPREHENSIVE METABOLIC PANEL
ALBUMIN: 3.8 g/dL (ref 3.5–5.0)
ALK PHOS: 92 U/L (ref 38–126)
ALT: 36 U/L (ref 0–44)
AST: 53 U/L — AB (ref 15–41)
Anion gap: 12 (ref 5–15)
BILIRUBIN TOTAL: 0.2 mg/dL — AB (ref 0.3–1.2)
BUN: 9 mg/dL (ref 6–20)
CALCIUM: 9.2 mg/dL (ref 8.9–10.3)
CO2: 21 mmol/L — AB (ref 22–32)
Chloride: 98 mmol/L (ref 98–111)
Creatinine, Ser: 0.67 mg/dL (ref 0.44–1.00)
GFR calc Af Amer: >60 mL/min (ref >60)
GFR calc non Af Amer: >60 mL/min (ref >60)
GLUCOSE: 356 mg/dL — AB (ref 70–99)
Potassium: 4.2 mmol/L (ref 3.5–5.1)
SODIUM: 131 mmol/L — AB (ref 135–145)
TOTAL PROTEIN: 7.3 g/dL (ref 6.5–8.1)

## 2017-10-14 LAB — TRIGLYCERIDES

## 2017-10-14 LAB — LIPID PANEL
Cholesterol: 1291 mg/dL — ABNORMAL HIGH (ref 0–200)
LDL Cholesterol: UNDETERMINED mg/dL (ref 0–99)
Triglycerides: >5000 mg/dL — ABNORMAL HIGH (ref <150)
VLDL: UNDETERMINED mg/dL (ref 0–40)

## 2017-10-14 LAB — TROPONIN I

## 2017-10-14 LAB — LIPASE, BLOOD: Lipase: 381 U/L — ABNORMAL HIGH (ref 11–51)

## 2017-10-14 LAB — GLUCOSE, CAPILLARY: Glucose-Capillary: 299 mg/dL — ABNORMAL HIGH (ref 70–99)

## 2017-10-14 MED ORDER — ONDANSETRON HCL 4 MG PO TABS: 4.0000 mg | ORAL_TABLET | Freq: Four times a day (QID) | ORAL | Status: DC | PRN

## 2017-10-14 MED ORDER — ONDANSETRON HCL 4 MG/2ML IJ SOLN
4.0000 mg | Freq: Once | INTRAMUSCULAR | Status: AC | PRN
  Administered 2017-10-14: 4 mg via INTRAVENOUS
  Filled 2017-10-14: qty 2

## 2017-10-14 MED ORDER — IOPAMIDOL (ISOVUE-300) INJECTION 61%
30.0000 mL | Freq: Once | INTRAVENOUS | Status: AC | PRN
  Administered 2017-10-14: 30 mL via ORAL
  Filled 2017-10-14: qty 30

## 2017-10-14 MED ORDER — HYDROMORPHONE HCL 1 MG/ML IJ SOLN
0.5000 mg | INTRAMUSCULAR | Status: AC
  Administered 2017-10-14: 0.5 mg via INTRAVENOUS
  Filled 2017-10-14: qty 1

## 2017-10-14 MED ORDER — ACETAMINOPHEN 650 MG RE SUPP: 650.0000 mg | Freq: Four times a day (QID) | RECTAL | Status: DC | PRN

## 2017-10-14 MED ORDER — GABAPENTIN 300 MG PO CAPS
300.0000 mg | ORAL_CAPSULE | Freq: Every day | ORAL | Status: DC
  Administered 2017-10-14 – 2017-10-26 (×12): 300 mg via ORAL
  Filled 2017-10-14 (×12): qty 1

## 2017-10-14 MED ORDER — HYDROCODONE-ACETAMINOPHEN 5-325 MG PO TABS
1.0000 | ORAL_TABLET | ORAL | Status: DC | PRN
  Administered 2017-10-14 – 2017-10-21 (×23): 2 via ORAL
  Administered 2017-10-21: 04:00:00 1 via ORAL
  Administered 2017-10-21 – 2017-10-27 (×23): 2 via ORAL
  Filled 2017-10-14 (×9): qty 2
  Filled 2017-10-14: qty 1
  Filled 2017-10-14 (×37): qty 2

## 2017-10-14 MED ORDER — ADULT MULTIVITAMIN W/MINERALS CH
1.0000 | ORAL_TABLET | Freq: Every day | ORAL | Status: DC
  Administered 2017-10-15 – 2017-10-27 (×11): 1 via ORAL
  Filled 2017-10-14 (×11): qty 1

## 2017-10-14 MED ORDER — FENTANYL CITRATE (PF) 100 MCG/2ML IJ SOLN
50.0000 ug | Freq: Once | INTRAMUSCULAR | Status: AC
  Administered 2017-10-14: 50 ug via INTRAVENOUS
  Filled 2017-10-14: qty 2

## 2017-10-14 MED ORDER — SODIUM CHLORIDE 0.9 % IV SOLN
INTRAVENOUS | Status: DC
  Administered 2017-10-14 – 2017-10-15 (×3): via INTRAVENOUS

## 2017-10-14 MED ORDER — VITAMIN D3 25 MCG (1000 UNIT) PO TABS
5000.0000 [IU] | ORAL_TABLET | Freq: Every day | ORAL | Status: DC
  Administered 2017-10-15 – 2017-10-27 (×13): 5000 [IU] via ORAL
  Filled 2017-10-14 (×25): qty 5

## 2017-10-14 MED ORDER — GNP CALCIUM 1200 1200-1000 MG-UNIT PO CHEW: 1200.0000 mg | CHEWABLE_TABLET | Freq: Every day | ORAL | Status: DC

## 2017-10-14 MED ORDER — ENOXAPARIN SODIUM 40 MG/0.4ML ~~LOC~~ SOLN
40.0000 mg | SUBCUTANEOUS | Status: DC
  Administered 2017-10-14: 40 mg via SUBCUTANEOUS
  Filled 2017-10-14: qty 0.4

## 2017-10-14 MED ORDER — HYDROCODONE-ACETAMINOPHEN 5-325 MG PO TABS
1.0000 | ORAL_TABLET | Freq: Every day | ORAL | Status: DC | PRN
  Administered 2017-10-20: 1 via ORAL
  Filled 2017-10-14 (×3): qty 1

## 2017-10-14 MED ORDER — ONDANSETRON HCL 4 MG/2ML IJ SOLN
4.0000 mg | Freq: Four times a day (QID) | INTRAMUSCULAR | Status: DC | PRN
  Administered 2017-10-14 – 2017-10-21 (×7): 4 mg via INTRAVENOUS
  Filled 2017-10-14 (×7): qty 2

## 2017-10-14 MED ORDER — INSULIN ASPART 100 UNIT/ML ~~LOC~~ SOLN
0.0000 [IU] | Freq: Three times a day (TID) | SUBCUTANEOUS | Status: DC
  Administered 2017-10-15 (×2): 7 [IU] via SUBCUTANEOUS
  Filled 2017-10-14 (×2): qty 1

## 2017-10-14 MED ORDER — CALCIUM CITRATE-VITAMIN D 500-500 MG-UNIT PO CHEW
2.0000 | CHEWABLE_TABLET | Freq: Every day | ORAL | Status: DC
  Administered 2017-10-15 – 2017-10-27 (×13): 2 via ORAL
  Filled 2017-10-14 (×13): qty 2

## 2017-10-14 MED ORDER — ACETAMINOPHEN 325 MG PO TABS
650.0000 mg | ORAL_TABLET | Freq: Four times a day (QID) | ORAL | Status: DC | PRN
  Administered 2017-10-24: 650 mg via ORAL
  Filled 2017-10-14: qty 2

## 2017-10-14 MED ORDER — FENTANYL CITRATE (PF) 100 MCG/2ML IJ SOLN
50.0000 ug | Freq: Once | INTRAMUSCULAR | Status: AC
  Administered 2017-10-14: 50 ug via INTRAVENOUS

## 2017-10-14 MED ORDER — IOPAMIDOL (ISOVUE-300) INJECTION 61%
100.0000 mL | Freq: Once | INTRAVENOUS | Status: AC | PRN
  Administered 2017-10-14: 100 mL via INTRAVENOUS
  Filled 2017-10-14: qty 100

## 2017-10-14 MED ORDER — BISACODYL 10 MG RE SUPP
10.0000 mg | Freq: Every day | RECTAL | Status: DC | PRN
  Filled 2017-10-14: qty 1

## 2017-10-14 MED ORDER — VENLAFAXINE HCL 37.5 MG PO TABS
37.5000 mg | ORAL_TABLET | Freq: Every day | ORAL | Status: DC
  Administered 2017-10-15 – 2017-10-26 (×12): 37.5 mg via ORAL
  Filled 2017-10-14 (×12): qty 1

## 2017-10-14 MED ORDER — METOPROLOL SUCCINATE ER 50 MG PO TB24: 100.0000 mg | ORAL_TABLET | Freq: Every day | ORAL | Status: DC

## 2017-10-14 MED ORDER — MAGNESIUM OXIDE 400 (241.3 MG) MG PO TABS
400.0000 mg | ORAL_TABLET | Freq: Two times a day (BID) | ORAL | Status: DC
  Administered 2017-10-14 – 2017-10-27 (×24): 400 mg via ORAL
  Filled 2017-10-14 (×23): qty 1

## 2017-10-14 MED ORDER — FENTANYL CITRATE (PF) 100 MCG/2ML IJ SOLN
50.0000 ug | INTRAMUSCULAR | Status: DC | PRN
  Administered 2017-10-14: 50 ug via INTRAVENOUS
  Filled 2017-10-14 (×2): qty 2

## 2017-10-14 MED ORDER — POLYETHYLENE GLYCOL 3350 17 G PO PACK
17.0000 g | PACK | Freq: Every day | ORAL | Status: DC | PRN
  Administered 2017-10-22: 17 g via ORAL
  Filled 2017-10-14: qty 1

## 2017-10-14 MED ORDER — HYDROMORPHONE HCL 1 MG/ML IJ SOLN
1.0000 mg | INTRAMUSCULAR | Status: DC | PRN
  Administered 2017-10-14: 23:00:00 1 mg via INTRAVENOUS
  Filled 2017-10-14: qty 1

## 2017-10-14 MED ORDER — CYCLOBENZAPRINE HCL 10 MG PO TABS
5.0000 mg | ORAL_TABLET | Freq: Three times a day (TID) | ORAL | Status: DC | PRN
  Administered 2017-10-16 – 2017-10-25 (×2): 5 mg via ORAL
  Filled 2017-10-14 (×2): qty 0.5
  Filled 2017-10-14: qty 1

## 2017-10-14 NOTE — ED Triage Notes (Signed)
PT to ED reporting sudden onset of upper centralized abd pain that radiates into her left ribcage. Pressure is reported to help decrease the pain. Nausea intermittently since onset, no vomiting or diarrhea reported.   Pt has had gallbladder removed but states the pain started after eating fast food and onion rings. Pt appears very uncomfortable in triage bu NAD noted.

## 2017-10-14 NOTE — H&P (Signed)
[facility] NAME: Tomie Spizzirri    MR#:  [id number]  DATE OF BIRTH:  1970-03-14  DATE OF ADMISSION:  10/14/2017  PRIMARY CARE PHYSICIAN: Leonel Ramsay, MD   REQUESTING/REFERRING PHYSICIAN: dr Jacqualine Code  CHIEF COMPLAINT:   Abdominal pain HISTORY OF PRESENT ILLNESS:  Nevaeha Finerty  is a 47 y.o. female with a known history of pancreatitis due to hyper lipidemia and diabetes who presents emergency room due to abdominal pain. Patient's midepigastric abdominal pain began this evening after eating a sandwich.  She has had nausea and some vomiting while in the emergency room.  She denies fever chills.  Pain is 10 out of 10.  She does have relief with fentanyl which was given in the ER. Patient denies fever, chills, diarrhea.  CT scan shows acute pancreatitis. Patient does not drink alcohol No gallstones are seen  PAST MEDICAL HISTORY:   Past Medical History:  Diagnosis Date  . ABDOMINAL PAIN OTHER SPECIFIED SITE 02/21/2009   Qualifier: Diagnosis of  By: Deborra Medina MD, Tanja Port    . ABDOMINAL PAIN-LUQ 04/17/2009   Qualifier: Diagnosis of  By: Fuller Plan MD Marijo Conception Anal fissure   . Asthma   . Diabetes mellitus without complication ([place])   . Headache(784.0)   . HEMATOCHEZIA 04/17/2009   Qualifier: Diagnosis of  By: Fuller Plan MD Marijo Conception Hypertension   . IBS (irritable bowel syndrome)   . Migraine headache 11/06/2008   Qualifier: Diagnosis of  By: Deborra Medina MD, Tanja Port    . OVARIAN CYST, RIGHT 02/21/2009   Qualifier: Diagnosis of  By: Deborra Medina MD, Tanja Port    . PALPITATIONS, OCCASIONAL 04/17/2010   Qualifier: Diagnosis of  By: Deborra Medina MD, Talia      PAST SURGICAL HISTORY:   Past Surgical History:  Procedure Laterality Date  . BREAST REDUCTION SURGERY     bilateral  . CESAREAN SECTION    . CHOLECYSTECTOMY    . VAGINAL HYSTERECTOMY      SOCIAL HISTORY:   Social History   Tobacco Use  . Smoking status: Former Smoker    Types:  Cigarettes    Last attempt to quit: 09/03/2014    Years since quitting: 3.1  . Smokeless tobacco: Never Used  Substance Use Topics  . Alcohol use: Yes    Comment: occassional    FAMILY HISTORY:   Family History  Problem Relation Age of Onset  . Cancer Mother        throat in the 53's  . Hyperlipidemia Mother   . Hypertension Mother   . Hyperlipidemia Father   . Hypertension Father   . Heart disease Father        S/P two stents    DRUG ALLERGIES:   Allergies  Allergen Reactions  . Atorvastatin     Other reaction(s): Muscle Pain  . Levofloxacin Hives  . Morphine Nausea And Vomiting    Other reaction(s): Vomiting REACTION: Headache REACTION: Headache  . Penicillins Rash    Has patient had a PCN reaction causing immediate rash, facial/tongue/throat swelling, SOB or lightheadedness with hypotension: Yes Has patient had a PCN reaction causing severe rash involving mucus membranes or skin necrosis: No Has patient had a PCN reaction that required hospitalization: No Has patient had a PCN reaction occurring within the last 10 years: No. If all of the above answers are "NO", then may proceed with Cephalosporin use.  . Tramadol Hcl Nausea And Vomiting  Other reaction(s): Vomiting "feels weird"    REVIEW OF SYSTEMS:   Review of Systems  Constitutional: Negative.  Negative for chills, fever and malaise/fatigue.  HENT: Negative.  Negative for ear discharge, ear pain, hearing loss, nosebleeds and sore throat.   Eyes: Negative.  Negative for blurred vision and pain.  Respiratory: Negative.  Negative for cough, hemoptysis, shortness of breath and wheezing.   Cardiovascular: Negative.  Negative for chest pain, palpitations and leg swelling.  Gastrointestinal: Positive for abdominal pain, nausea and vomiting. Negative for blood in stool and diarrhea.  Genitourinary: Negative.  Negative for dysuria.  Musculoskeletal: Negative.  Negative for back pain.  Skin: Negative.    Neurological: Negative for dizziness, tremors, speech change, focal weakness, seizures and headaches.  Endo/Heme/Allergies: Negative.  Does not bruise/bleed easily.  Psychiatric/Behavioral: Negative.  Negative for depression, hallucinations and suicidal ideas.    MEDICATIONS AT HOME:   Prior to Admission medications   Medication Sig Start Date End Date Taking? Authorizing Provider  albuterol (PROAIR HFA) 108 (90 Base) MCG/ACT inhaler Inhale 2 puffs into the lungs every 6 (six) hours as needed.  05/03/15 05/06/17  [provider]  Aspirin-Acetaminophen-Caffeine (EXCEDRIN EXTRA STRENGTH PO) Take 2 tablets by mouth as needed.    [provider]  budesonide-formoterol (SYMBICORT) 80-4.5 MCG/ACT inhaler Inhale into the lungs. 02/05/17 02/05/18  [provider]  Calcium Carbonate-Vit D-Min (GNP CALCIUM 1200) 1200-1000 MG-UNIT CHEW Chew 1,200 mg by mouth daily with breakfast. Take in combination with vitamin D and magnesium. 05/19/17 11/15/17  Milinda Pointer, MD  Cholecalciferol (VITAMIN D3) 5000 units CAPS Take 1 capsule (5,000 Units total) by mouth daily with breakfast. Take along with calcium and magnesium. 05/19/17 11/15/17  Milinda Pointer, MD  cyclobenzaprine (FLEXERIL) 5 MG tablet Take 1 tablet (5 mg total) by mouth 3 (three) times daily as needed for muscle spasms. 05/19/17 11/15/17  Milinda Pointer, MD  esomeprazole (NEXIUM) 40 MG capsule Take 1 capsule (40 mg total) by mouth 2 (two) times daily before a meal for 15 days. Take while on ibuprofen. 05/19/17 06/03/17  Milinda Pointer, MD  gabapentin (NEURONTIN) 300 MG capsule Take 1 capsule (300 mg total) by mouth at bedtime. 05/19/17 11/15/17  Milinda Pointer, MD  HYDROcodone-acetaminophen (NORCO/VICODIN) 5-325 MG tablet Take 1 tablet by mouth daily as needed for severe pain. 05/19/17 06/18/17  Milinda Pointer, MD  Magnesium 500 MG CAPS Take 1 capsule (500 mg total) by mouth 2 (two) times daily at 8 am and 10 pm.  05/19/17 11/15/17  Milinda Pointer, MD  metFORMIN (GLUCOPHAGE) 500 MG tablet Take 500 mg by mouth 2 (two) times daily with a meal.  10/06/16 05/06/17  [provider]  metoprolol succinate (TOPROL-XL) 100 MG 24 hr tablet Take 100 mg by mouth daily.  11/09/16   [provider]  Multiple Vitamin (MULTIVITAMIN) tablet Take 1 tablet by mouth daily.    [provider]  naproxen (EC NAPROSYN) 500 MG EC tablet Take 1 tablet (500 mg total) by mouth 2 (two) times daily with a meal. 05/06/17 05/06/18  Vevelyn Francois, NP  naproxen sodium (ALEVE) 220 MG tablet Take 220 mg by mouth 2 (two) times daily as needed.    [provider]  omeprazole (PRILOSEC) 20 MG capsule Take 1 capsule (20 mg total) by mouth daily. 05/19/17 05/19/18  Milinda Pointer, MD  venlafaxine (EFFEXOR) 25 MG tablet Take 37.5 mg by mouth daily.     [provider]      VITAL SIGNS:  Blood  pressure (!) 182/107, pulse 73, temperature 98 F (36.7 C), temperature source Oral, resp. rate (!) 22, weight 86.5 kg, SpO2 98 %.  PHYSICAL EXAMINATION:   Physical Exam  Constitutional: She is oriented to person, place, and time. No distress.  HENT:  Head: Normocephalic.  Eyes: No scleral icterus.  Neck: Normal range of motion. Neck supple. No JVD present. No tracheal deviation present.  Cardiovascular: Normal rate, regular rhythm and normal heart sounds. Exam reveals no gallop and no friction rub.  No murmur heard. Pulmonary/Chest: Effort normal and breath sounds normal. No respiratory distress. She has no wheezes. She has no rales. She exhibits no tenderness.  Abdominal: Bowel sounds are normal. She exhibits no distension and no mass. There is tenderness. There is no rebound and no guarding.  Musculoskeletal: Normal range of motion. She exhibits no edema.  Neurological: She is alert and oriented to person, place, and time.  Skin: Skin is warm. No rash noted. No erythema.  Psychiatric: Judgment normal.       LABORATORY PANEL:   CBC Recent Labs  Lab 10/14/17 1748  WBC 12.4*  HGB 11.9*  HCT 36.5  PLT 363   ------------------------------------------------------------------------------------------------------------------  Chemistries  Recent Labs  Lab 10/14/17 1748  NA 131*  K 4.2  CL 98  CO2 21*  GLUCOSE 356*  BUN 9  CREATININE 0.67  CALCIUM 9.2  AST 53*  ALT 36  ALKPHOS 92  BILITOT 0.2*   ------------------------------------------------------------------------------------------------------------------  Cardiac Enzymes Recent Labs  Lab 10/14/17 1847  TROPONINI <0.03   ------------------------------------------------------------------------------------------------------------------  RADIOLOGY:  Ct Head Wo Contrast  Result Date: 10/14/2017 CLINICAL DATA:  Acute headache. EXAM: CT HEAD WITHOUT CONTRAST TECHNIQUE: Contiguous axial images were obtained from the base of the skull through the vertex without intravenous contrast. COMPARISON:  No comparison studies available. FINDINGS: Brain: There is no evidence for acute hemorrhage, hydrocephalus, mass lesion, or abnormal extra-axial fluid collection. No definite CT evidence for acute infarction. Vascular: No hyperdense vessel or unexpected calcification. Skull: No evidence for fracture. No worrisome lytic or sclerotic lesion. Sinuses/Orbits: The visualized paranasal sinuses and mastoid air cells are clear. Visualized portions of the globes and intraorbital fat are unremarkable. Other: None. IMPRESSION: Normal exam. Electronically Signed   By: Misty Stanley M.D.   On: 10/14/2017 20:15   Ct Abdomen Pelvis W Contrast  Result Date: 10/14/2017 CLINICAL DATA:  Acute onset central abdominal pain. Intermittent nausea. EXAM: CT ABDOMEN AND PELVIS WITH CONTRAST TECHNIQUE: Multidetector CT imaging of the abdomen and pelvis was performed using the standard protocol following bolus administration of intravenous contrast. CONTRAST:   133mL ISOVUE-300 IOPAMIDOL (ISOVUE-300) INJECTION 61% COMPARISON:  10/07/2012 FINDINGS: Lower chest: Unremarkable Hepatobiliary: No focal abnormality within the liver parenchyma. Gallbladder appears surgically absent. No intrahepatic or extrahepatic biliary dilation. Pancreas: No dilatation of the main pancreatic duct. Pancreatic parenchyma enhances throughout. There is edema in and around the head of the pancreas that tracks into the pancreatico duodenal groove and around the descending and transverse duodenum. Spleen: No splenomegaly. No focal mass lesion. Adrenals/Urinary Tract: No adrenal nodule or mass. Kidneys unremarkable. No evidence for hydroureter. The urinary bladder appears normal for the degree of distention. Stomach/Bowel: Stomach is nondistended. No gastric wall thickening. No evidence of outlet obstruction. Peripancreatic edema noted. No small bowel wall thickening. No small bowel dilatation. The terminal ileum is normal. The appendix is normal. No gross colonic mass. No colonic wall thickening. No substantial diverticular change. Vascular/Lymphatic: No abdominal aortic aneurysm. No abdominal aortic atherosclerotic calcification. Portal vein,  superior mesenteric vein, and splenic vein are patent there is no gastrohepatic or hepatoduodenal ligament lymphadenopathy. No intraperitoneal or retroperitoneal lymphadenopathy. No pelvic sidewall lymphadenopathy. Reproductive: Uterus surgically absent.  There is no adnexal mass. Other: No intraperitoneal free fluid. Musculoskeletal: No worrisome lytic or sclerotic osseous abnormality. IMPRESSION: 1. Edema in and around the head of the pancreas, mainly in the uncinate process, compatible with pancreatitis. Edema also surrounds the descending and transverse duodenum. As such, duodenitis would also be a consideration. Pancreatitis is favored as there does not appear to be significant duodenal wall thickening. 2. Exam otherwise unremarkable. Electronically Signed    By: Misty Stanley M.D.   On: 10/14/2017 20:23    EKG:  Normal sinus rhythm no ST elevation or depression  IMPRESSION AND PLAN:   47 y/o female with history of pancreatitis due to hyperlipidemia and diabetes who presents emergency room due to abdominal pain and found to have pancreatitis.  1.  Acute pancreatitis likely due to elevated triglyceride level as patient does not drink alcohol and no gallstones seen on CT scan. Follow-up on lipid panel Supportive management for acute pancreatitis with IV fluids, antiemetics and pain control N.p.o. until abdominal pain subsides  2.  Diabetes: Check A1c Diabetes nurse consultation requested Sliding scale initiated Hold oral medications  3.  Depression: Continue Effexor    All the records are reviewed and case discussed with ED provider. Management plans discussed with the patient and she is agreement  CODE STATUS: full  TOTAL TIME TAKING CARE OF THIS PATIENT: 48 minutes.    Jazzman Loughmiller M.D on 10/14/2017 at 8:35 PM  Between 7am to 6pm - Pager - [phone]  After 6pm go to [email or web address] - password [email or web address] [facility]  Office  [phone]  CC: Primary care physician; Leonel Ramsay, MD

## 2017-10-14 NOTE — ED Provider Notes (Signed)
[facility] Emergency Department Provider Note   ____________________________________________   First MD Initiated Contact with Patient 10/14/17 1806     (approximate)  I have reviewed the triage vital signs and the nursing notes.   HISTORY  Chief Complaint Left upper abdominal pain   HPI Kelly Rollins is a 46 y.o. female history of previous cholecystectomy  Patient reports that she ate a sandwich this evening, just after while eating the last of which she began experiencing a sudden very severe sharp pain in her left upper abdomen and left mid back.  Associated with nausea but no vomiting.  No fevers or chills.  Been in her normal state of health otherwise.  Is unremitting constant severe pain in the left upper abdomen.  No relief with fentanyl given at triage.  No nausea at present.  No chest pain or trouble breathing.  No pain in the lower abdomen.  No diarrhea or vomiting.  Not noticed in her blood in her urine or pain or burning with urination.  Previous hysterectomy.  Past Medical History:  Diagnosis Date  . ABDOMINAL PAIN OTHER SPECIFIED SITE 02/21/2009   Qualifier: Diagnosis of  By: Deborra Medina MD, Tanja Port    . ABDOMINAL PAIN-LUQ 04/17/2009   Qualifier: Diagnosis of  By: Fuller Plan MD Marijo Conception Anal fissure   . Asthma   . Diabetes mellitus without complication ([place])   . Headache(784.0)   . HEMATOCHEZIA 04/17/2009   Qualifier: Diagnosis of  By: Fuller Plan MD Marijo Conception Hypertension   . IBS (irritable bowel syndrome)   . Migraine headache 11/06/2008   Qualifier: Diagnosis of  By: Deborra Medina MD, Tanja Port    . OVARIAN CYST, RIGHT 02/21/2009   Qualifier: Diagnosis of  By: Deborra Medina MD, Tanja Port    . PALPITATIONS, OCCASIONAL 04/17/2010   Qualifier: Diagnosis of  By: Deborra Medina MD, Talia      Patient Active Problem List   Diagnosis Date Noted  . Pancreatitis 10/14/2017  . Chronic hip pain (Secondary source of pain) (Bilateral) (L>R) 05/19/2017  . Elevated  C-reactive protein (CRP) 05/19/2017  . Elevated sed rate 05/19/2017  . Spondylosis without myelopathy or radiculopathy, lumbosacral region 05/19/2017  . Pharmacologic therapy 05/06/2017  . Disorder of skeletal system 05/06/2017  . Problems influencing health status 05/06/2017  . Chest pain with moderate risk of acute coronary syndrome 04/21/2017  . DOE (dyspnea on exertion) 04/21/2017  . Intermittent palpitations 04/21/2017  . Lumbar radiculopathy 09/08/2016  . Acute postoperative pain 06/08/2016  . Vitamin D insufficiency 05/04/2016  . Depression 02/04/2016  . Diabetes mellitus type 2, uncomplicated ([place]) 93/71/6967  . Fatty liver 02/04/2016  . HTN (hypertension), benign 02/04/2016  . Chronic pain syndrome 01/15/2016  . Acute low back pain 05/28/2015  . Lumbar transverse process fracture ([place]) (Left L1) (03/11/15) 05/28/2015  . Long term current use of opiate analgesic 05/01/2015  . Long term prescription opiate use 05/01/2015  . Opiate use (25 MME/Day) 05/01/2015  . Encounter for therapeutic drug level monitoring 05/01/2015  . Encounter for pain management planning 05/01/2015  . Chronic low back pain (Primary Source of Pain) (Bilateral) (L>R) 05/01/2015  . Lumbar facet syndrome (Bilateral) (L>R) 05/01/2015  . Chronic knee pain [facility] source of pain) (Bilateral) (L>R) 05/01/2015  . Lumbar spondylosis 05/01/2015  . Neuropathic pain 05/01/2015  . Neurogenic pain 05/01/2015  . Myofascial pain 05/01/2015  . Essential hypertriglyceridemia 04/26/2014  . Anxiety 05/15/2010  . Obesity 05/15/2010  . Hyperlipidemia 04/03/2010  .  Fatty liver disease 04/02/2010  . Pure hypercholesterolemia 03/19/2010  . GERD 04/17/2009  . Constipation 04/17/2009  . TOBACCO ABUSE 11/06/2008    Past Surgical History:  Procedure Laterality Date  . BREAST REDUCTION SURGERY     bilateral  . CESAREAN SECTION    . CHOLECYSTECTOMY    . VAGINAL HYSTERECTOMY      Prior to Admission medications     Medication Sig Start Date End Date Taking? Authorizing Provider  albuterol (PROAIR HFA) 108 (90 Base) MCG/ACT inhaler Inhale 2 puffs into the lungs every 6 (six) hours as needed.  05/03/15 05/06/17  [provider]  Aspirin-Acetaminophen-Caffeine (EXCEDRIN EXTRA STRENGTH PO) Take 2 tablets by mouth as needed.    [provider]  budesonide-formoterol (SYMBICORT) 80-4.5 MCG/ACT inhaler Inhale into the lungs. 02/05/17 02/05/18  [provider]  Calcium Carbonate-Vit D-Min (GNP CALCIUM 1200) 1200-1000 MG-UNIT CHEW Chew 1,200 mg by mouth daily with breakfast. Take in combination with vitamin D and magnesium. 05/19/17 11/15/17  Milinda Pointer, MD  Cholecalciferol (VITAMIN D3) 5000 units CAPS Take 1 capsule (5,000 Units total) by mouth daily with breakfast. Take along with calcium and magnesium. 05/19/17 11/15/17  Milinda Pointer, MD  cyclobenzaprine (FLEXERIL) 5 MG tablet Take 1 tablet (5 mg total) by mouth 3 (three) times daily as needed for muscle spasms. 05/19/17 11/15/17  Milinda Pointer, MD  esomeprazole (NEXIUM) 40 MG capsule Take 1 capsule (40 mg total) by mouth 2 (two) times daily before a meal for 15 days. Take while on ibuprofen. 05/19/17 06/03/17  Milinda Pointer, MD  gabapentin (NEURONTIN) 300 MG capsule Take 1 capsule (300 mg total) by mouth at bedtime. 05/19/17 11/15/17  Milinda Pointer, MD  HYDROcodone-acetaminophen (NORCO/VICODIN) 5-325 MG tablet Take 1 tablet by mouth daily as needed for severe pain. 05/19/17 06/18/17  Milinda Pointer, MD  Magnesium 500 MG CAPS Take 1 capsule (500 mg total) by mouth 2 (two) times daily at 8 am and 10 pm. 05/19/17 11/15/17  Milinda Pointer, MD  metFORMIN (GLUCOPHAGE) 500 MG tablet Take 500 mg by mouth 2 (two) times daily with a meal.  10/06/16 05/06/17  [provider]  metoprolol succinate (TOPROL-XL) 100 MG 24 hr tablet Take 100 mg by mouth daily.  11/09/16   [provider]  Multiple Vitamin (MULTIVITAMIN)  tablet Take 1 tablet by mouth daily.    [provider]  naproxen (EC NAPROSYN) 500 MG EC tablet Take 1 tablet (500 mg total) by mouth 2 (two) times daily with a meal. 05/06/17 05/06/18  Vevelyn Francois, NP  naproxen sodium (ALEVE) 220 MG tablet Take 220 mg by mouth 2 (two) times daily as needed.    [provider]  omeprazole (PRILOSEC) 20 MG capsule Take 1 capsule (20 mg total) by mouth daily. 05/19/17 05/19/18  Milinda Pointer, MD  venlafaxine (EFFEXOR) 25 MG tablet Take 37.5 mg by mouth daily.     [provider]    Allergies Atorvastatin; Levofloxacin; Morphine; Penicillins; and Tramadol hcl  Family History  Problem Relation Age of Onset  . Cancer Mother        throat in the 85's  . Hyperlipidemia Mother   . Hypertension Mother   . Hyperlipidemia Father   . Hypertension Father   . Heart disease Father        S/P two stents    Social History Social History   Tobacco Use  . Smoking status: Former Smoker    Types: Cigarettes    Last attempt to quit: 09/03/2014  Years since quitting: 3.1  . Smokeless tobacco: Never Used  Substance Use Topics  . Alcohol use: Yes    Comment: occassional  . Drug use: No    Review of Systems Constitutional: No fever/chills Eyes: No visual changes. ENT: No sore throat. Cardiovascular: Denies chest pain. Respiratory: Denies shortness of breath. Gastrointestinal: No vomiting.  No diarrhea.  No constipation. Genitourinary: Negative for dysuria. Musculoskeletal: Negative for back pain. Skin: Negative for rash. Neurological: Negative for headaches, focal weakness or numbness.    ____________________________________________   PHYSICAL EXAM:  VITAL SIGNS: ED Triage Vitals  Enc Vitals Group     BP 10/14/17 1742 (!) 146/84     Pulse Rate 10/14/17 1742 82     Resp 10/14/17 1742 16     Temp 10/14/17 1742 98 F (36.7 C)     Temp Source 10/14/17 1742 Oral     SpO2 10/14/17 1742 100 %     Weight 10/14/17 1753  190 lb 11.2 oz (86.5 kg)     Height --      Head Circumference --      Peak Flow --      Pain Score 10/14/17 1743 8     Pain Loc --      Pain Edu? --      Excl. in [place]? --     Constitutional: Alert and oriented.  Left flank.  Appears in pain, wincing holding a pillow.  Appears to be in severe pain. Eyes: Conjunctivae are normal. Head: Atraumatic. Nose: No congestion/rhinnorhea. Mouth/Throat: Mucous membranes are moist. Neck: No stridor.   Cardiovascular: Normal rate, regular rhythm. Grossly normal heart sounds.  Good peripheral circulation. Respiratory: Normal respiratory effort.  No retractions. Lungs CTAB. Gastrointestinal: Soft and nontender on the right side, but mild tenderness in the left flank, notable left CVA tenderness. No distention.  No peritonitis in any quadrant. Musculoskeletal: No lower extremity tenderness nor edema. Neurologic:  Normal speech and language. No gross focal neurologic deficits are appreciated.  Skin:  Skin is warm, dry and intact. No rash noted. Psychiatric: Mood and affect are normal. Speech and behavior are normal.  ____________________________________________   LABS (all labs ordered are listed, but only abnormal results are displayed)  Labs Reviewed  LIPASE, BLOOD - Abnormal; Notable for the following components:      Result Value   Lipase 381 (*)    All other components within normal limits  COMPREHENSIVE METABOLIC PANEL - Abnormal; Notable for the following components:   Sodium 131 (*)    CO2 21 (*)    Glucose, Bld 356 (*)    AST 53 (*)    Total Bilirubin 0.2 (*)    All other components within normal limits  CBC - Abnormal; Notable for the following components:   WBC 12.4 (*)    Hemoglobin 11.9 (*)    MCH 38.5 (*)    RDW 15.0 (*)    All other components within normal limits  TROPONIN I  URINALYSIS, COMPLETE (UACMP) WITH MICROSCOPIC  LIPID PANEL  TRIGLYCERIDES   ____________________________________________  EKG  ED ECG  REPORT I, Delman Kitten, the attending physician, personally viewed and interpreted this ECG.  Date: 10/14/2017 EKG Time: 1745 Rate: 80 Rhythm: normal sinus rhythm QRS Axis: normal Intervals: normal ST/T Wave abnormalities: normal Narrative Interpretation: no evidence of acute ischemia  ____________________________________________  RADIOLOGY  CT abdomen pelvis results reviewed by me  Edema in and around the head of the pancreas, mainly in the uncinate process, compatible with  pancreatitis. Edema also surrounds the descending and transverse duodenum. As such, duodenitis would also be a consideration. Pancreatitis is favored as there does not appear to be significant duodenal wall thickening.   ____________________________________________   PROCEDURES  Procedure(s) performed: None  Procedures  Critical Care performed: No  ____________________________________________   INITIAL IMPRESSION / ASSESSMENT AND PLAN / ED COURSE  Pertinent labs & imaging results that were available during my care of the patient were reviewed by me and considered in my medical decision making (see chart for details).  Differential diagnosis includes but is not limited to, abdominal perforation, aortic dissection, cholecystitis, appendicitis, diverticulitis, colitis, esophagitis/gastritis, kidney stone, pyelonephritis, urinary tract infection, aortic aneurysm. All are considered in decision and treatment plan. Based upon the patient's presentation and risk factors, and the presentation with acuity and severe left flank pain will give additional pain medications, and obtain labs and CT scan abdomen pelvis.  Denies cardiac pulmonary neurologic symptoms.  Normal and reassuring EKG.  ----------------------------------------- 6:44 PM on 10/14/2017 -----------------------------------------  After second dose of hydromorphone pain is improving and she appears to be resting much more comfortably.      ----------------------------------------- 8:43 PM on 10/14/2017 -----------------------------------------  Admit for pancreatitis, intractable abdominal pain.  Patient in understanding and agreement.  Discussed with Dr. Benjie Karvonen   ____________________________________________   FINAL CLINICAL IMPRESSION(S) / ED DIAGNOSES  Final diagnoses:  Acute pancreatitis, unspecified complication status, unspecified pancreatitis type      NEW MEDICATIONS STARTED DURING THIS VISIT:  New Prescriptions   No medications on file     Note:  This document was prepared using Dragon voice recognition software and may include unintentional dictation errors.     Delman Kitten, MD 10/14/17 2043

## 2017-10-15 DIAGNOSIS — K859 Acute pancreatitis without necrosis or infection, unspecified: Secondary | ICD-10-CM

## 2017-10-15 LAB — BASIC METABOLIC PANEL
ANION GAP: 9 (ref 5–15)
Anion gap: 15 (ref 5–15)
BUN: 7 mg/dL (ref 6–20)
BUN: 6 mg/dL (ref 6–20)
CALCIUM: 7.7 mg/dL — AB (ref 8.9–10.3)
CHLORIDE: 89 mmol/L — AB (ref 98–111)
CO2: 17 mmol/L — ABNORMAL LOW (ref 22–32)
CO2: 18 mmol/L — ABNORMAL LOW (ref 22–32)
Calcium: 7.5 mg/dL — ABNORMAL LOW (ref 8.9–10.3)
Chloride: 106 mmol/L (ref 98–111)
Creatinine, Ser: 0.42 mg/dL — ABNORMAL LOW (ref 0.44–1.00)
Creatinine, Ser: 0.7 mg/dL (ref 0.44–1.00)
GFR calc Af Amer: >60 mL/min (ref >60)
GFR calc non Af Amer: >60 mL/min (ref >60)
GFR calc non Af Amer: >60 mL/min (ref >60)
Glucose, Bld: 323 mg/dL — ABNORMAL HIGH (ref 70–99)
Glucose, Bld: 364 mg/dL — ABNORMAL HIGH (ref 70–99)
POTASSIUM: 3.9 mmol/L (ref 3.5–5.1)
Potassium: 3.7 mmol/L (ref 3.5–5.1)
SODIUM: 121 mmol/L — AB (ref 135–145)
SODIUM: 133 mmol/L — AB (ref 135–145)

## 2017-10-15 LAB — CBC
HEMATOCRIT: 38.7 % (ref 35.0–47.0)
Hemoglobin: 13.3 g/dL (ref 12.0–16.0)
MCH: 29.8 pg (ref 26.0–34.0)
MCHC: 34.3 g/dL (ref 32.0–36.0)
MCV: 86.8 fL (ref 80.0–100.0)
Platelets: 321 10*3/uL (ref 150–440)
RBC: 4.46 MIL/uL (ref 3.80–5.20)
RDW: 14.9 % — ABNORMAL HIGH (ref 11.5–14.5)
WBC: 11.2 10*3/uL — AB (ref 3.6–11.0)

## 2017-10-15 LAB — URINALYSIS, COMPLETE (UACMP) WITH MICROSCOPIC
BILIRUBIN URINE: NEGATIVE
Bacteria, UA: NONE SEEN
KETONES UR: 80 mg/dL — AB
LEUKOCYTES UA: NEGATIVE
Nitrite: NEGATIVE
PH: 5 (ref 5.0–8.0)
Protein, ur: NEGATIVE mg/dL
Specific Gravity, Urine: 1.036 — ABNORMAL HIGH (ref 1.005–1.030)

## 2017-10-15 LAB — TRIGLYCERIDES

## 2017-10-15 LAB — GLUCOSE, CAPILLARY
GLUCOSE-CAPILLARY: 346 mg/dL — AB (ref 70–99)
GLUCOSE-CAPILLARY: 297 mg/dL — AB (ref 70–99)
GLUCOSE-CAPILLARY: 332 mg/dL — AB (ref 70–99)
GLUCOSE-CAPILLARY: 307 mg/dL — AB (ref 70–99)
GLUCOSE-CAPILLARY: 240 mg/dL — AB (ref 70–99)
GLUCOSE-CAPILLARY: 267 mg/dL — AB (ref 70–99)
Glucose-Capillary: 315 mg/dL — ABNORMAL HIGH (ref 70–99)
Glucose-Capillary: 317 mg/dL — ABNORMAL HIGH (ref 70–99)
Glucose-Capillary: 240 mg/dL — ABNORMAL HIGH (ref 70–99)
Glucose-Capillary: 197 mg/dL — ABNORMAL HIGH (ref 70–99)

## 2017-10-15 LAB — MRSA PCR SCREENING: MRSA by PCR: NEGATIVE

## 2017-10-15 LAB — HEMOGLOBIN A1C
Hgb A1c MFr Bld: 14.5 % — ABNORMAL HIGH (ref 4.8–5.6)
Mean Plasma Glucose: 369.45 mg/dL

## 2017-10-15 MED ORDER — INSULIN STARTER KIT- SYRINGES (ENGLISH)
1.0000 | Freq: Once | Status: AC
  Administered 2017-10-15: 1
  Filled 2017-10-15: qty 1

## 2017-10-15 MED ORDER — FENTANYL CITRATE (PF) 100 MCG/2ML IJ SOLN
50.0000 ug | INTRAMUSCULAR | Status: DC | PRN
  Administered 2017-10-15 – 2017-10-18 (×24): 50 ug via INTRAVENOUS
  Filled 2017-10-15 (×24): qty 2

## 2017-10-15 MED ORDER — DEXTROSE-NACL 5-0.9 % IV SOLN
INTRAVENOUS | Status: DC
  Administered 2017-10-15 – 2017-10-16 (×2): via INTRAVENOUS

## 2017-10-15 MED ORDER — LIVING WELL WITH DIABETES BOOK
Freq: Once | Status: AC
  Administered 2017-10-15: 15:00:00
  Filled 2017-10-15: qty 1

## 2017-10-15 MED ORDER — POTASSIUM CHLORIDE CRYS ER 20 MEQ PO TBCR
40.0000 meq | EXTENDED_RELEASE_TABLET | Freq: Once | ORAL | Status: AC
  Administered 2017-10-15: 40 meq via ORAL
  Filled 2017-10-15: qty 2

## 2017-10-15 MED ORDER — METOCLOPRAMIDE HCL 5 MG/ML IJ SOLN
10.0000 mg | Freq: Once | INTRAMUSCULAR | Status: AC
  Administered 2017-10-15: 10 mg via INTRAVENOUS
  Filled 2017-10-15: qty 2

## 2017-10-15 MED ORDER — FAMOTIDINE IN NACL 20-0.9 MG/50ML-% IV SOLN
20.0000 mg | Freq: Two times a day (BID) | INTRAVENOUS | Status: DC
  Administered 2017-10-15 – 2017-10-21 (×12): 20 mg via INTRAVENOUS
  Filled 2017-10-15 (×12): qty 50

## 2017-10-15 MED ORDER — ENOXAPARIN SODIUM 40 MG/0.4ML ~~LOC~~ SOLN
40.0000 mg | SUBCUTANEOUS | Status: DC
  Administered 2017-10-15 – 2017-10-26 (×12): 40 mg via SUBCUTANEOUS
  Filled 2017-10-15 (×12): qty 0.4

## 2017-10-15 MED ORDER — SODIUM CHLORIDE 0.9 % IV SOLN
INTRAVENOUS | Status: DC
  Administered 2017-10-15 – 2017-10-16 (×2): 6 [IU]/h via INTRAVENOUS
  Administered 2017-10-16: 7 [IU]/h via INTRAVENOUS
  Administered 2017-10-17: 8 [IU]/h via INTRAVENOUS
  Administered 2017-10-18: 7 [IU]/h via INTRAVENOUS
  Administered 2017-10-19: 1 [IU]/h via INTRAVENOUS
  Filled 2017-10-15 (×7): qty 1

## 2017-10-15 MED ORDER — DEXTROSE 50 % IV SOLN: 1.0000 | INTRAVENOUS | Status: DC | PRN

## 2017-10-15 MED ORDER — KETOROLAC TROMETHAMINE 30 MG/ML IJ SOLN
30.0000 mg | Freq: Four times a day (QID) | INTRAMUSCULAR | Status: AC | PRN
  Administered 2017-10-15 – 2017-10-19 (×13): 30 mg via INTRAVENOUS
  Filled 2017-10-15 (×13): qty 1

## 2017-10-15 MED ORDER — HYDRALAZINE HCL 20 MG/ML IJ SOLN: 10.0000 mg | Freq: Four times a day (QID) | INTRAMUSCULAR | Status: DC | PRN

## 2017-10-15 MED ORDER — INFLUENZA VAC SPLIT QUAD 0.5 ML IM SUSY
0.5000 mL | PREFILLED_SYRINGE | INTRAMUSCULAR | Status: DC
  Filled 2017-10-15: qty 0.5

## 2017-10-15 MED ORDER — PROMETHAZINE HCL 25 MG/ML IJ SOLN
12.5000 mg | Freq: Four times a day (QID) | INTRAMUSCULAR | Status: DC | PRN
  Administered 2017-10-15 – 2017-10-17 (×4): 12.5 mg via INTRAVENOUS
  Filled 2017-10-15 (×4): qty 1

## 2017-10-15 NOTE — Consult Note (Signed)
Reason for Consult: Insulin infusion for pancreatitis Referring Physician: Dr. Theone Stanley Kelly Rollins is an 47 y.o. female.  HPI: Kelly Rollins is a 47 year old female with a past medical history remarkable for pancreatitis secondary to hypertriglyceridemia and diabetes, was brought into the emergency room secondary to severe mid epigastric abdominal pain which began after food intake. She has had some nausea, vomiting in the emergency department. She had some relief with fentanyl. CT scan of the abdomen shows acute pancreatitis patient denies alcohol intake and no history of gallstones. Er initial triglyceride level was greater than 5000She is being admitted to the intensive care unit for insulin infusion and hydration close blood sugar monitoring. Other pertinent labs reveal an initial blood sugar 356, sodium 131, white count 12.4, lipase of 381 and cholesterol 1291.  Past Medical History:  Diagnosis Date  . ABDOMINAL PAIN OTHER SPECIFIED SITE 02/21/2009   Qualifier: Diagnosis of  By: Deborra Medina MD, Tanja Port    . ABDOMINAL PAIN-LUQ 04/17/2009   Qualifier: Diagnosis of  By: Fuller Plan MD Marijo Conception Anal fissure   . Asthma   . Diabetes mellitus without complication ([place])   . Headache(784.0)   . HEMATOCHEZIA 04/17/2009   Qualifier: Diagnosis of  By: Fuller Plan MD Marijo Conception Hypertension   . IBS (irritable bowel syndrome)   . Migraine headache 11/06/2008   Qualifier: Diagnosis of  By: Deborra Medina MD, Tanja Port    . OVARIAN CYST, RIGHT 02/21/2009   Qualifier: Diagnosis of  By: Deborra Medina MD, Tanja Port    . PALPITATIONS, OCCASIONAL 04/17/2010   Qualifier: Diagnosis of  By: Deborra Medina MD, Talia      Past Surgical History:  Procedure Laterality Date  . BREAST REDUCTION SURGERY     bilateral  . CESAREAN SECTION    . CHOLECYSTECTOMY    . VAGINAL HYSTERECTOMY      Family History  Problem Relation Age of Onset  . Cancer Mother        throat in the 50's  . Hyperlipidemia Mother   . Hypertension Mother   .  Hyperlipidemia Father   . Hypertension Father   . Heart disease Father        S/P two stents    Social History:  reports that she quit smoking about 3 years ago. Her smoking use included cigarettes. She has never used smokeless tobacco. She reports that she drinks alcohol. She reports that she does not use drugs.  Allergies:  Allergies  Allergen Reactions  . Atorvastatin     Other reaction(s): Muscle Pain  . Levofloxacin Hives  . Morphine Nausea And Vomiting    Other reaction(s): Vomiting REACTION: Headache REACTION: Headache  . Penicillins Rash    Has patient had a PCN reaction causing immediate rash, facial/tongue/throat swelling, SOB or lightheadedness with hypotension: Yes Has patient had a PCN reaction causing severe rash involving mucus membranes or skin necrosis: No Has patient had a PCN reaction that required hospitalization: No Has patient had a PCN reaction occurring within the last 10 years: No. If all of the above answers are "NO", then may proceed with Cephalosporin use.  . Tramadol Hcl Nausea And Vomiting    Other reaction(s): Vomiting "feels weird"    Medications: I have reviewed the patient's current medications.  Results for orders placed or performed during the hospital encounter of 10/14/17 (from the past 48 hour(s))  Lipase, blood     Status: Abnormal   Collection Time: 10/14/17  5:48 PM  Result  Value Ref Range   Lipase 381 (H) 11 - 51 U/L    Comment: Performed at [facility], [address] [place], [place] [address]  Comprehensive metabolic panel     Status: Abnormal   Collection Time: 10/14/17  5:48 PM  Result Value Ref Range   Sodium 131 (L) 135 - 145 mmol/L    Comment: ULTRAFUGED KLW   Potassium 4.2 3.5 - 5.1 mmol/L   Chloride 98 98 - 111 mmol/L   CO2 21 (L) 22 - 32 mmol/L   Glucose, Bld 356 (H) 70 - 99 mg/dL   BUN 9 6 - 20 mg/dL   Creatinine, Ser 0.67 0.44 - 1.00 mg/dL   Calcium 9.2 8.9 - 10.3 mg/dL   Total Protein 7.3 6.5 - 8.1  g/dL   Albumin 3.8 3.5 - 5.0 g/dL   AST 53 (H) 15 - 41 U/L   ALT 36 0 - 44 U/L   Alkaline Phosphatase 92 38 - 126 U/L   Total Bilirubin 0.2 (L) 0.3 - 1.2 mg/dL   GFR calc non Af Amer >60 >60 mL/min   GFR calc Af Amer >60 >60 mL/min    Comment: (NOTE) The eGFR has been calculated using the CKD EPI equation. This calculation has not been validated in all clinical situations. eGFR's persistently <60 mL/min signify possible Chronic Kidney Disease.    Anion gap 12 5 - 15    Comment: Performed at [facility], [address] [place], [place] [address]  CBC     Status: Abnormal   Collection Time: 10/14/17  5:48 PM  Result Value Ref Range   WBC 12.4 (H) 3.6 - 11.0 K/uL   RBC 4.26 3.80 - 5.20 MIL/uL   Hemoglobin 11.9 (L) 12.0 - 16.0 g/dL    Comment: CORRECTED FOR LIPEMIA   HCT 36.5 35.0 - 47.0 %   MCV 85.8 80.0 - 100.0 fL   MCH 38.5 (H) 26.0 - 34.0 pg   MCHC 32.6 32.0 - 36.0 g/dL    Comment: CORRECTED FOR LIPEMIA   RDW 15.0 (H) 11.5 - 14.5 %   Platelets 363 150 - 440 K/uL    Comment: Performed at [facility], [address] [place], [place] [address]  Hemoglobin A1c     Status: Abnormal   Collection Time: 10/14/17  5:48 PM  Result Value Ref Range   Hgb A1c MFr Bld 14.5 (H) 4.8 - 5.6 %    Comment: (NOTE) Pre diabetes:          5.7%-6.4% Diabetes:              >6.4% Glycemic control for   <7.0% adults with diabetes    Mean Plasma Glucose 369.45 mg/dL    Comment: Performed at [facility], [address], [place], [place] [address]  Troponin I     Status: None   Collection Time: 10/14/17  6:47 PM  Result Value Ref Range   Troponin I <0.03 <0.03 ng/mL    Comment: Performed at [facility], [address] [place], [place] [address]  Lipid panel     Status: Abnormal   Collection Time: 10/14/17  7:47 PM  Result Value Ref Range   Cholesterol 1,291 (H) 0 - 200 mg/dL    Comment: RESULT CONFIRMED BY MANUAL DILUTION   Triglycerides >5,000 (H)  <150 mg/dL    Comment: RESULT CONFIRMED BY MANUAL DILUTION   HDL NOT REPORTED DUE TO HIGH TRIGLYCERIDES >40 mg/dL  Total CHOL/HDL Ratio NOT REPORTED DUE TO HIGH TRIGLYCERIDES RATIO   VLDL UNABLE TO CALCULATE IF TRIGLYCERIDE OVER 400 mg/dL 0 - 40 mg/dL   LDL Cholesterol UNABLE TO CALCULATE IF TRIGLYCERIDE OVER 400 mg/dL 0 - 99 mg/dL    Comment: Performed at [facility], [address] [place], [place] [address]  Triglycerides     Status: Abnormal   Collection Time: 10/14/17  7:47 PM  Result Value Ref Range   Triglycerides >5,000 (H) <150 mg/dL    Comment: RESULT CONFIRMED BY MANUAL DILUTION Performed at [facility], [address] [place], [place] [address]   Glucose, capillary     Status: Abnormal   Collection Time: 10/14/17 10:07 PM  Result Value Ref Range   Glucose-Capillary 299 (H) 70 - 99 mg/dL  Urinalysis, Complete w Microscopic     Status: Abnormal   Collection Time: 10/15/17  2:19 AM  Result Value Ref Range   Color, Urine STRAW (A) YELLOW   APPearance CLEAR (A) CLEAR   Specific Gravity, Urine 1.036 (H) 1.005 - 1.030   pH 5.0 5.0 - 8.0   Glucose, UA >=500 (A) NEGATIVE mg/dL   Hgb urine dipstick SMALL (A) NEGATIVE   Bilirubin Urine NEGATIVE NEGATIVE   Ketones, ur 80 (A) NEGATIVE mg/dL   Protein, ur NEGATIVE NEGATIVE mg/dL   Nitrite NEGATIVE NEGATIVE   Leukocytes, UA NEGATIVE NEGATIVE   RBC / HPF 0-5 0 - 5 RBC/hpf   WBC, UA 0-5 0 - 5 WBC/hpf   Bacteria, UA NONE SEEN NONE SEEN   Squamous Epithelial / LPF 0-5 0 - 5    Comment: Performed at [facility], [address] [place], [place] [address]  Basic metabolic panel     Status: Abnormal   Collection Time: 10/15/17  6:16 AM  Result Value Ref Range   Sodium 121 (L) 135 - 145 mmol/L   Potassium 3.9 3.5 - 5.1 mmol/L   Chloride 89 (L) 98 - 111 mmol/L   CO2 17 (L) 22 - 32 mmol/L   Glucose, Bld 323 (H) 70 - 99 mg/dL   BUN 7 6 - 20 mg/dL   Creatinine, Ser 0.42 (L) 0.44 - 1.00 mg/dL    Calcium 7.5 (L) 8.9 - 10.3 mg/dL   GFR calc non Af Amer >60 >60 mL/min   GFR calc Af Amer >60 >60 mL/min    Comment: (NOTE) The eGFR has been calculated using the CKD EPI equation. This calculation has not been validated in all clinical situations. eGFR's persistently <60 mL/min signify possible Chronic Kidney Disease.    Anion gap 15 5 - 15    Comment: Performed at [facility], [address] [place], [place] [address]  CBC     Status: Abnormal   Collection Time: 10/15/17  6:16 AM  Result Value Ref Range   WBC 11.2 (H) 3.6 - 11.0 K/uL   RBC 4.46 3.80 - 5.20 MIL/uL   Hemoglobin 13.3 12.0 - 16.0 g/dL    Comment: CORRECTED FOR LIPEMIA   HCT 38.7 35.0 - 47.0 %   MCV 86.8 80.0 - 100.0 fL   MCH 29.8 26.0 - 34.0 pg    Comment: CORRECTED FOR LIPEMIA   MCHC 34.3 32.0 - 36.0 g/dL    Comment: CORRECTED FOR LIPEMIA   RDW 14.9 (H) 11.5 - 14.5 %   Platelets 321 150 - 440 K/uL    Comment: Performed at [facility], [address] [place], [place] [address]  Glucose,  capillary     Status: Abnormal   Collection Time: 10/15/17  7:50 AM  Result Value Ref Range   Glucose-Capillary 315 (H) 70 - 99 mg/dL   Comment 1 Notify RN   Glucose, capillary     Status: Abnormal   Collection Time: 10/15/17 12:34 PM  Result Value Ref Range   Glucose-Capillary 346 (H) 70 - 99 mg/dL   Comment 1 Notify RN     Ct Head Wo Contrast  Result Date: 10/14/2017 CLINICAL DATA:  Acute headache. EXAM: CT HEAD WITHOUT CONTRAST TECHNIQUE: Contiguous axial images were obtained from the base of the skull through the vertex without intravenous contrast. COMPARISON:  No comparison studies available. FINDINGS: Brain: There is no evidence for acute hemorrhage, hydrocephalus, mass lesion, or abnormal extra-axial fluid collection. No definite CT evidence for acute infarction. Vascular: No hyperdense vessel or unexpected calcification. Skull: No evidence for fracture. No worrisome lytic or sclerotic lesion.  Sinuses/Orbits: The visualized paranasal sinuses and mastoid air cells are clear. Visualized portions of the globes and intraorbital fat are unremarkable. Other: None. IMPRESSION: Normal exam. Electronically Signed   By: Misty Stanley M.D.   On: 10/14/2017 20:15   Ct Abdomen Pelvis W Contrast  Result Date: 10/14/2017 CLINICAL DATA:  Acute onset central abdominal pain. Intermittent nausea. EXAM: CT ABDOMEN AND PELVIS WITH CONTRAST TECHNIQUE: Multidetector CT imaging of the abdomen and pelvis was performed using the standard protocol following bolus administration of intravenous contrast. CONTRAST:  112m ISOVUE-300 IOPAMIDOL (ISOVUE-300) INJECTION 61% COMPARISON:  10/07/2012 FINDINGS: Lower chest: Unremarkable Hepatobiliary: No focal abnormality within the liver parenchyma. Gallbladder appears surgically absent. No intrahepatic or extrahepatic biliary dilation. Pancreas: No dilatation of the main pancreatic duct. Pancreatic parenchyma enhances throughout. There is edema in and around the head of the pancreas that tracks into the pancreatico duodenal groove and around the descending and transverse duodenum. Spleen: No splenomegaly. No focal mass lesion. Adrenals/Urinary Tract: No adrenal nodule or mass. Kidneys unremarkable. No evidence for hydroureter. The urinary bladder appears normal for the degree of distention. Stomach/Bowel: Stomach is nondistended. No gastric wall thickening. No evidence of outlet obstruction. Peripancreatic edema noted. No small bowel wall thickening. No small bowel dilatation. The terminal ileum is normal. The appendix is normal. No gross colonic mass. No colonic wall thickening. No substantial diverticular change. Vascular/Lymphatic: No abdominal aortic aneurysm. No abdominal aortic atherosclerotic calcification. Portal vein, superior mesenteric vein, and splenic vein are patent there is no gastrohepatic or hepatoduodenal ligament lymphadenopathy. No intraperitoneal or retroperitoneal  lymphadenopathy. No pelvic sidewall lymphadenopathy. Reproductive: Uterus surgically absent.  There is no adnexal mass. Other: No intraperitoneal free fluid. Musculoskeletal: No worrisome lytic or sclerotic osseous abnormality. IMPRESSION: 1. Edema in and around the head of the pancreas, mainly in the uncinate process, compatible with pancreatitis. Edema also surrounds the descending and transverse duodenum. As such, duodenitis would also be a consideration. Pancreatitis is favored as there does not appear to be significant duodenal wall thickening. 2. Exam otherwise unremarkable. Electronically Signed   By: EMisty StanleyM.D.   On: 10/14/2017 20:23    ROS  Patient just received pain medication and is somnolent See history of present illness for review of systems Blood pressure 127/81, pulse (!) 133, temperature 98.3 F (36.8 C), temperature source Oral, resp. rate 18, height '5\' 3"'  (1.6 m), weight 79.7 kg, SpO2 94 %. Physical Exam  Constitutional: patient is somnolent, just received pain medication, is arousable, no acute distress.  HEENT: trachea midline, no thyromegaly appreciated, no oral lesions  and no carotid bruits noted Cardiovascular: Normal rate, regular rhythm and normal heart sounds. No murmur heard. Pulmonary/Chest: Effort normal and breath sounds normal. No respiratory distress. She has no wheezes. She has no rales. She exhibits no tenderness.  Abdominal: Bowel sounds are normal. She exhibits no distension and no mass. Epigastric tenderness noted to palpation Musculoskeletal: Normal range of motion. She exhibits no edema.  Neurological: She is alert and oriented to person, place, and time.  Skin: Skin is warm. No rash noted. No erythema.   Assessment/Plan:  47 year old female with acute pancreatitis with known history of hypertriglyceridemia, hyperlipidemia and diabetes with recurrent episode. Patient does not have any other risk factors for pancreatitis Initial triglyceride level was  greater than 5000 also elevated total cholesterol noted at 1291. Agree with admitting to the intensive care unit for insulin infusion, hydration, pain control, along with close blood sugar check and will most likely need to begin D5 at some pointto keep insulin infusion running We'll follow along with you  Leukocytosis. Most likely reactive no clear evidence of infection  Diabetes mellitus. Will need to follow blood sugars closely as she will have to remain on insulin infusion until triglyceride levels reduce  Hermelinda Dellen, D.O.  Hermelinda Dellen 10/15/2017, 1:57 PM

## 2017-10-15 NOTE — Progress Notes (Signed)
Pharmacy Electrolyte Monitoring Consult:  Pharmacy consulted to assist in monitoring and replacing electrolytes in this 47 y.o. female admitted on 10/14/2017 with pancreatitis. Patient transferred to the ICU for triglycerides over 5000 for management with an insulin drip.  Labs:  Sodium (mmol/L)  Date Value  10/15/2017 133 (L)  05/06/2017 135  03/18/2013 137   Potassium (mmol/L)  Date Value  10/15/2017 3.7  03/18/2013 3.9   Magnesium (mg/dL)  Date Value  05/06/2017 2.0  04/27/2013 1.8   Calcium (mg/dL)  Date Value  10/15/2017 7.7 (L)   Calcium, Total (mg/dL)  Date Value  03/18/2013 9.4   Albumin (g/dL)  Date Value  10/14/2017 3.8  05/06/2017 4.1  03/18/2013 3.9    Assessment/Plan: Patient on insulin drip currently at 6 units/hr. Will order potassium 40 mEq PO x 1.  Goal potassium ~4 and magnesium ~ 2.  BMP ordered twice daily while patient continues on insulin drip.  Pharmacy to continue to follow patient and replace electrolytes per consult.  Tawnya Crook, PharmD Pharmacy Resident  10/15/2017 5:08 PM

## 2017-10-15 NOTE — Plan of Care (Signed)
  Problem: Education: Goal: Knowledge of General Education information will improve Description Including pain rating scale, medication(s)/side effects and non-pharmacologic comfort measures Outcome: Progressing   Problem: Health Behavior/Discharge Planning: Goal: Ability to manage health-related needs will improve Outcome: Progressing   Problem: Clinical Measurements: Goal: Diagnostic test results will improve Outcome: Progressing Goal: Respiratory complications will improve Outcome: Progressing   Problem: Activity: Goal: Risk for activity intolerance will decrease Outcome: Progressing   Problem: Elimination: Goal: Will not experience complications related to bowel motility Outcome: Progressing   Problem: Pain Managment: Goal: General experience of comfort will improve Outcome: Progressing   Problem: Skin Integrity: Goal: Risk for impaired skin integrity will decrease Outcome: Progressing

## 2017-10-15 NOTE — Care Management Note (Signed)
Case Management Note  Patient Details  Name: Kelly Rollins MRN: [id number] Date of Birth: 12/24/1970  Subjective/Objective:  Admitted to [facility] with the diagnosis of pancreatitis. Lives with husband, Kelly Rollins [phone]). Last seen Kelly Rollins in April 2019. Now has no insurance and doesn't work. Husband works at [employer]. Lives in [place]. Uses rolling walker and cane in the home.  Telephone number [phone]                  Action/Plan: [facility] Medication Management application given  Will sent referral to [facility]   Expected Discharge Date:                  Expected Discharge Plan:     In-House Referral:   yes  Discharge planning Services   yes  Post Acute Care Choice:    Choice offered to:     DME Arranged:    DME Agency:     HH Arranged:    HH Agency:     Status of Service:     If discussed at [place] of Stay Meetings, dates discussed:    Additional Comments:  Shelbie Ammons, RN MSN CCM Care Management [phone] 10/15/2017, 12:23 PM

## 2017-10-15 NOTE — Progress Notes (Signed)
Pharmacy Electrolyte Monitoring Consult:  Pharmacy consulted to assist in monitoring and replacing electrolytes in this 47 y.o. female admitted on 10/14/2017 with pancreatitis. Patient transferred to the ICU for triglycerides over 5000 for management with an insulin drip.  Labs:  Sodium (mmol/L)  Date Value  10/15/2017 133 (L)  05/06/2017 135  03/18/2013 137   Potassium (mmol/L)  Date Value  10/15/2017 3.7  03/18/2013 3.9   Magnesium (mg/dL)  Date Value  05/06/2017 2.0  04/27/2013 1.8   Calcium (mg/dL)  Date Value  10/15/2017 7.7 (L)   Calcium, Total (mg/dL)  Date Value  03/18/2013 9.4   Albumin (g/dL)  Date Value  10/14/2017 3.8  05/06/2017 4.1  03/18/2013 3.9    Assessment/Plan: Patient received potassium 40 mEq of potassium x 1.   Insulin drip currently infusing at 9 units/hr and D5NS at w64mL/hr. Will continue to titrate insulin hourly for goal rate of 12 units/hr. After insulin therapy maximized will titrate D5NS for goal BG of 180-200. Will monitor sodium closely and adjust sodium in D5 accordingly.   Goal potassium ~4 and magnesium ~ 2.  Will increase BMP frequency to Q6hr.   Pharmacy to continue to follow patient and replace electrolytes per consult.  Carra Brindley L 10/15/2017 7:41 PM

## 2017-10-15 NOTE — Progress Notes (Signed)
[facility] [place] NAME: Kelly Rollins    MR#:  [id number]  DATE OF BIRTH:  04-24-1970  SUBJECTIVE:   Patient presents to the hospital due to significant abdominal pain nausea and vomiting and noted to have acute pancreatitis.  Patient also noted to be severely hyperlipidemic with severe hypertriglyceridemia.  Patient still having significant abdominal pain nausea this morning.  Also Hyponatremic.    REVIEW OF SYSTEMS:    Review of Systems  Constitutional: Negative for chills and fever.  HENT: Negative for congestion and tinnitus.   Eyes: Negative for blurred vision and double vision.  Respiratory: Negative for cough, shortness of breath and wheezing.   Cardiovascular: Negative for chest pain, orthopnea and PND.  Gastrointestinal: Positive for abdominal pain, nausea and vomiting. Negative for diarrhea.  Genitourinary: Negative for dysuria and hematuria.  Neurological: Negative for dizziness, sensory change and focal weakness.  All other systems reviewed and are negative.   Nutrition: NPO Tolerating Diet: Donne Hazel PT: Ambulatory   DRUG ALLERGIES:   Allergies  Allergen Reactions  . Atorvastatin     Other reaction(s): Muscle Pain  . Levofloxacin Hives  . Morphine Nausea And Vomiting    Other reaction(s): Vomiting REACTION: Headache REACTION: Headache  . Penicillins Rash    Has patient had a PCN reaction causing immediate rash, facial/tongue/throat swelling, SOB or lightheadedness with hypotension: Yes Has patient had a PCN reaction causing severe rash involving mucus membranes or skin necrosis: No Has patient had a PCN reaction that required hospitalization: No Has patient had a PCN reaction occurring within the last 10 years: No. If all of the above answers are "NO", then may proceed with Cephalosporin use.  . Tramadol Hcl Nausea And Vomiting    Other reaction(s): Vomiting "feels weird"    VITALS:  Blood pressure  127/81, pulse (!) 133, temperature 98.3 F (36.8 C), temperature source Oral, resp. rate 18, height 5\' 3"  (1.6 m), weight 79.7 kg, SpO2 94 %.  PHYSICAL EXAMINATION:   Physical Exam  GENERAL:  47 y.o.-year-old patient lying in bed in no acute distress.  EYES: Pupils equal, round, reactive to light and accommodation. No scleral icterus. Extraocular muscles intact.  HEENT: Head atraumatic, normocephalic. Oropharynx and nasopharynx clear.  NECK:  Supple, no jugular venous distention. No thyroid enlargement, no tenderness.  LUNGS: Normal breath sounds bilaterally, no wheezing, rales, rhonchi. No use of accessory muscles of respiration.  CARDIOVASCULAR: S1, S2 normal. No murmurs, rubs, or gallops.  ABDOMEN: Soft, Tender in Epigastric area, no rebound, rigidity, nondistended. Bowel sounds present. No organomegaly or mass.  EXTREMITIES: No cyanosis, clubbing or edema b/l.    NEUROLOGIC: Cranial nerves II through XII are intact. No focal Motor or sensory deficits b/l.   PSYCHIATRIC: The patient is alert and oriented x 3.  SKIN: No obvious rash, lesion, or ulcer.    LABORATORY PANEL:   CBC Recent Labs  Lab 10/15/17 0616  WBC 11.2*  HGB 13.3  HCT 38.7  PLT 321   ------------------------------------------------------------------------------------------------------------------  Chemistries  Recent Labs  Lab 10/14/17 1748 10/15/17 0616  NA 131* 121*  K 4.2 3.9  CL 98 89*  CO2 21* 17*  GLUCOSE 356* 323*  BUN 9 7  CREATININE 0.67 0.42*  CALCIUM 9.2 7.5*  AST 53*  --   ALT 36  --   ALKPHOS 92  --   BILITOT 0.2*  --    ------------------------------------------------------------------------------------------------------------------  Cardiac Enzymes Recent Labs  Lab 10/14/17 1847  TROPONINI <0.03   ------------------------------------------------------------------------------------------------------------------  RADIOLOGY:  Ct Head Wo Contrast  Result Date:  10/14/2017 CLINICAL DATA:  Acute headache. EXAM: CT HEAD WITHOUT CONTRAST TECHNIQUE: Contiguous axial images were obtained from the base of the skull through the vertex without intravenous contrast. COMPARISON:  No comparison studies available. FINDINGS: Brain: There is no evidence for acute hemorrhage, hydrocephalus, mass lesion, or abnormal extra-axial fluid collection. No definite CT evidence for acute infarction. Vascular: No hyperdense vessel or unexpected calcification. Skull: No evidence for fracture. No worrisome lytic or sclerotic lesion. Sinuses/Orbits: The visualized paranasal sinuses and mastoid air cells are clear. Visualized portions of the globes and intraorbital fat are unremarkable. Other: None. IMPRESSION: Normal exam. Electronically Signed   By: Misty Stanley M.D.   On: 10/14/2017 20:15   Ct Abdomen Pelvis W Contrast  Result Date: 10/14/2017 CLINICAL DATA:  Acute onset central abdominal pain. Intermittent nausea. EXAM: CT ABDOMEN AND PELVIS WITH CONTRAST TECHNIQUE: Multidetector CT imaging of the abdomen and pelvis was performed using the standard protocol following bolus administration of intravenous contrast. CONTRAST:  148mL ISOVUE-300 IOPAMIDOL (ISOVUE-300) INJECTION 61% COMPARISON:  10/07/2012 FINDINGS: Lower chest: Unremarkable Hepatobiliary: No focal abnormality within the liver parenchyma. Gallbladder appears surgically absent. No intrahepatic or extrahepatic biliary dilation. Pancreas: No dilatation of the main pancreatic duct. Pancreatic parenchyma enhances throughout. There is edema in and around the head of the pancreas that tracks into the pancreatico duodenal groove and around the descending and transverse duodenum. Spleen: No splenomegaly. No focal mass lesion. Adrenals/Urinary Tract: No adrenal nodule or mass. Kidneys unremarkable. No evidence for hydroureter. The urinary bladder appears normal for the degree of distention. Stomach/Bowel: Stomach is nondistended. No gastric  wall thickening. No evidence of outlet obstruction. Peripancreatic edema noted. No small bowel wall thickening. No small bowel dilatation. The terminal ileum is normal. The appendix is normal. No gross colonic mass. No colonic wall thickening. No substantial diverticular change. Vascular/Lymphatic: No abdominal aortic aneurysm. No abdominal aortic atherosclerotic calcification. Portal vein, superior mesenteric vein, and splenic vein are patent there is no gastrohepatic or hepatoduodenal ligament lymphadenopathy. No intraperitoneal or retroperitoneal lymphadenopathy. No pelvic sidewall lymphadenopathy. Reproductive: Uterus surgically absent.  There is no adnexal mass. Other: No intraperitoneal free fluid. Musculoskeletal: No worrisome lytic or sclerotic osseous abnormality. IMPRESSION: 1. Edema in and around the head of the pancreas, mainly in the uncinate process, compatible with pancreatitis. Edema also surrounds the descending and transverse duodenum. As such, duodenitis would also be a consideration. Pancreatitis is favored as there does not appear to be significant duodenal wall thickening. 2. Exam otherwise unremarkable. Electronically Signed   By: Misty Stanley M.D.   On: 10/14/2017 20:23     ASSESSMENT AND PLAN:   47 year old female past medical history of hypertension, diabetes, irritable bowel syndrome, hypertriglyceridemia, who presents to the hospital due to abdominal pain nausea and vomiting and noted to have acute pancreatitis.  1.  Acute pancreatitis-secondary to severe hyperlipidemia/hypertriglyceridemia. - CT scan abdomen pelvis is not suggestive of any pancreatic necrosis.  Continue supportive care with aggressive IV fluid hydration, antiemetics, pain control.  2.  Severe hypertriglyceridemia-patient's triglycerides are over 5000 with total cholesterol being over 1200.  Patient is supposed to be on TriCor but unclear if she is compliant with it. - We will start her on IV insulin drip and  transfer her to stepdown level of care.  Once her abdominal pain nausea and vomiting improves we can restart her TriCor.  She is intolerant to statins.  3.  Hyponatremia-  likely pseudohyponatremia secondary to some mild hyperglycemia along with dehydration. - We will continue IV fluids, follow sodium.  4.  Diabetes type 2 without complication-patient's hemoglobin A1c is as high as 15.  She is only on oral meds, she will likely need to be on insulin prior to discharge.  Transfer her to stepdown level care to be started on the insulin drip given her severe hyper triglyceridemia. -Appreciate diabetes coronary input.  5.  Diabetic neuropathy-continue gabapentin.  6.  Essential hypertension- patient cannot tolerate oral meds, continue PRN IV hydralazine.  7. Depression - cont. Effexor.    Transfer to ICU to be started on Insulin gtt.  Signed out pt. To Dr. Jefferson Fuel.   All the records are reviewed and case discussed with Care Management/Social Worker. Management plans discussed with the patient, family and they are in agreement.  CODE STATUS: Full code  DVT Prophylaxis: Lovenox  TOTAL TIME TAKING CARE OF THIS PATIENT: 30 minutes.   POSSIBLE D/C IN 2-3 DAYS, DEPENDING ON CLINICAL CONDITION.   Henreitta Leber M.D on 10/15/2017 at 2:05 PM  Between 7am to 6pm - Pager - [phone]  After 6pm go to [email or web address] - Proofreader  [facility]  Office  [phone]  CC: Primary care physician; Leonel Ramsay, MD

## 2017-10-15 NOTE — Progress Notes (Signed)
Pnt admitted at approximately  9/12 for N/V ABD pain. Complaints overnight of nausea, 1 occurrence of emesis of approx 3L of fluid. MD Duane Boston notified. Zofran, phenergan, and reglan ordered given as ordered. Nausea continues this am but pnt reports to be "getting better".  Blood sugar from HS finger stick was 299. Notified Dr. Duane Boston of blood sugar and reported BS has come down; was higher from blood draw in ED however pnt is also NPO. No new orders.   Pain 5-10/10 pain to LUQ of abdomen overnight. Closer to morning pnt states pain is now posterior and bilateral. Pnt c/o headache associated with IV pain meds given to her. MD notified and toradol given. Pnt then reported that fentanyl was initially given upon arrival in ED and that seemed to work the best for her. Notified MD Duane Boston who then put in order for fentanyl. Fentanyl given x's 3 overnight with moderate relief(5/10). Pnt continues to be noticeably very uncomfortable. Will report change in pain description to day nurse in hopes of further assess and evaluations today.

## 2017-10-15 NOTE — Progress Notes (Signed)
Patient transferred to ICU room 6. Report given to [place], Therapist, sports.

## 2017-10-15 NOTE — Progress Notes (Signed)
Husband has been getting patient out of bed without notifying nursing staff. Informed husband that patient is taking narcotics and should not be out of the bed. Patient has been medicated with Fentanyl and Norco by this writer since change of shift.

## 2017-10-15 NOTE — Progress Notes (Addendum)
Inpatient Diabetes Program Recommendations  AACE/ADA: New Consensus Statement on Inpatient Glycemic Control (2019)  Target Ranges:  Prepandial:   less than 140 mg/dL      Peak postprandial:   less than 180 mg/dL (1-2 hours)      Critically ill patients:  140 - 180 mg/dL   Results for Kelly Rollins, Kelly Rollins (MRN [id number]) as of 10/15/2017 07:39  Ref. Range 10/14/2017 22:07  Glucose-Capillary Latest Ref Range: 70 - 99 mg/dL 299 (H)  Results for Kelly Rollins, Kelly Rollins (MRN [id number]) as of 10/15/2017 07:39  Ref. Range 10/14/2017 17:48  Glucose Latest Ref Range: 70 - 99 mg/dL 356 (H)  Results for Kelly Rollins, Kelly Rollins (MRN [id number]) as of 10/15/2017 07:39  Ref. Range 10/14/2017 19:47  Triglycerides Latest Ref Range: <150 mg/dL >5,000 (H)    Review of Glycemic Control  Diabetes history: DM2 Outpatient Diabetes medications: Metformin 500 mg BID Current orders for Inpatient glycemic control: Novolog 0-9 units TID with meals  Inpatient Diabetes Program Recommendations:  Insulin - IV drip/GlucoStabilizer: Noted triglycerides >5000 on labs on 10/14/17. May need to transfer patient to ICU and use IV insulin to get triglycerides down and pancreatitis improved. HgbA1C: A1C in process.  Addendum 10/15/17'@12'$ :03-Spoke with patient regarding DM control. Patient lying in bed with eyes closed, facial grimacing, guarding abdomen, and looks very uncomfortable. Inquired about pain and patient states that she is experiencing pain and is getting pain medication. Talked with RN and she reports that patient last received pain medication at 10:30 and she can get it every 2 hours. Reminded patient to call RN about getting pain medication when appropriate.   Patient states that she has been dx with DM for many years and she takes Metformin 500 mg BID as an outpatient for DM control. Patient states that she has no insurance at this time (lost insurance in April) and that seen her PCP in April prior to losing insurance. Patient  does not monitor glucose at home. Patient states that she has had symptoms of hyperglycemia for 2 weeks (polydipsia, polyphagia, increased weakness, blurry vision).  Inquired about prior A1C and patient states she can not remember what her last A1C was.  Discussed A1C results (14.5% on 10/14/17) and explained that her current A1C indicates an average glucose of 369 mg/dl over the past 2-3 months. Discussed glucose and A1C goals. Discussed importance of checking CBGs and maintaining good CBG control to prevent long-term and short-term complications. Discussed impact of nutrition, exercise, stress, sickness, and medications on diabetes control. Discussed how pancreatitis and hypertriglyceridemia can impact glycemic control. Explained that at times hypertriglyceridemia may require IV insulin to come down and get glucose controlled but noted it would be up to the attending doctor on proper treatment.  Discussed current insulin regimen and glucose values. Explained that patient may need insulin for DM control as an outpatient especially with A1C being 14.5%.  Patient is agreeable to take insulin as an outpatient if needed. Informed patient CM consult would be requested for follow up and medication assistance. Patient verbalized understanding information discussed and states that she has no further questions at this time. Ordered Living Well with DM book, insulin syringe starter kit, patient education by bedside RNs, CM consult.    If patient will discharge on insulin, will need to prescribe affordable insulin and/or insulins she can get from the [facility] has Levemir and Humalog vials). If MD plans to discharge on insulin, she will need to be educated on vial/syringe by bedside RNs.  Thanks, Marie Latorsha Curling, RN, MSN, CDE Diabetes Coordinator Inpatient Diabetes Program [phone] (Team Pager from 8am to 5pm)   

## 2017-10-16 LAB — GLUCOSE, CAPILLARY
GLUCOSE-CAPILLARY: 167 mg/dL — AB (ref 70–99)
GLUCOSE-CAPILLARY: 200 mg/dL — AB (ref 70–99)
GLUCOSE-CAPILLARY: 208 mg/dL — AB (ref 70–99)
GLUCOSE-CAPILLARY: 211 mg/dL — AB (ref 70–99)
GLUCOSE-CAPILLARY: 215 mg/dL — AB (ref 70–99)
GLUCOSE-CAPILLARY: 218 mg/dL — AB (ref 70–99)
GLUCOSE-CAPILLARY: 223 mg/dL — AB (ref 70–99)
GLUCOSE-CAPILLARY: 241 mg/dL — AB (ref 70–99)
GLUCOSE-CAPILLARY: 251 mg/dL — AB (ref 70–99)
GLUCOSE-CAPILLARY: 276 mg/dL — AB (ref 70–99)
Glucose-Capillary: 177 mg/dL — ABNORMAL HIGH (ref 70–99)
Glucose-Capillary: 177 mg/dL — ABNORMAL HIGH (ref 70–99)
Glucose-Capillary: 160 mg/dL — ABNORMAL HIGH (ref 70–99)
Glucose-Capillary: 251 mg/dL — ABNORMAL HIGH (ref 70–99)
Glucose-Capillary: 243 mg/dL — ABNORMAL HIGH (ref 70–99)
Glucose-Capillary: 237 mg/dL — ABNORMAL HIGH (ref 70–99)
Glucose-Capillary: 223 mg/dL — ABNORMAL HIGH (ref 70–99)

## 2017-10-16 LAB — BASIC METABOLIC PANEL
ANION GAP: 10 (ref 5–15)
Anion gap: 10 (ref 5–15)
Anion gap: 9 (ref 5–15)
BUN: 10 mg/dL (ref 6–20)
BUN: 11 mg/dL (ref 6–20)
BUN: 11 mg/dL (ref 6–20)
BUN: 8 mg/dL (ref 6–20)
CALCIUM: 7.2 mg/dL — AB (ref 8.9–10.3)
CALCIUM: 7.3 mg/dL — AB (ref 8.9–10.3)
CALCIUM: 7.3 mg/dL — AB (ref 8.9–10.3)
CHLORIDE: 108 mmol/L (ref 98–111)
CO2: 18 mmol/L — ABNORMAL LOW (ref 22–32)
CO2: 21 mmol/L — AB (ref 22–32)
CO2: 18 mmol/L — ABNORMAL LOW (ref 22–32)
CO2: 18 mmol/L — ABNORMAL LOW (ref 22–32)
CREATININE: 0.57 mg/dL (ref 0.44–1.00)
Calcium: 6.8 mg/dL — ABNORMAL LOW (ref 8.9–10.3)
Chloride: 102 mmol/L (ref 98–111)
Chloride: 107 mmol/L (ref 98–111)
Chloride: 103 mmol/L (ref 98–111)
Creatinine, Ser: 0.52 mg/dL (ref 0.44–1.00)
Creatinine, Ser: 0.84 mg/dL (ref 0.44–1.00)
Creatinine, Ser: 0.67 mg/dL (ref 0.44–1.00)
GFR calc Af Amer: >60 mL/min (ref >60)
GFR calc non Af Amer: >60 mL/min (ref >60)
GFR calc non Af Amer: >60 mL/min (ref >60)
GFR calc non Af Amer: >60 mL/min (ref >60)
GLUCOSE: 215 mg/dL — AB (ref 70–99)
GLUCOSE: 202 mg/dL — AB (ref 70–99)
Glucose, Bld: 250 mg/dL — ABNORMAL HIGH (ref 70–99)
Glucose, Bld: 253 mg/dL — ABNORMAL HIGH (ref 70–99)
POTASSIUM: UNDETERMINED mmol/L (ref 3.5–5.1)
Potassium: 4.4 mmol/L (ref 3.5–5.1)
Potassium: UNDETERMINED mmol/L (ref 3.5–5.1)
Potassium: 3.8 mmol/L (ref 3.5–5.1)
SODIUM: 135 mmol/L (ref 135–145)
SODIUM: 130 mmol/L — AB (ref 135–145)
Sodium: 136 mmol/L (ref 135–145)
Sodium: UNDETERMINED mmol/L (ref 135–145)

## 2017-10-16 LAB — PHOSPHORUS
PHOSPHORUS: 1.1 mg/dL — AB (ref 2.5–4.6)
Phosphorus: UNDETERMINED mg/dL (ref 2.5–4.6)
Phosphorus: 1.6 mg/dL — ABNORMAL LOW (ref 2.5–4.6)

## 2017-10-16 LAB — HIV ANTIBODY (ROUTINE TESTING W REFLEX): HIV Screen 4th Generation wRfx: NONREACTIVE

## 2017-10-16 LAB — MAGNESIUM: Magnesium: 2.2 mg/dL (ref 1.7–2.4)

## 2017-10-16 LAB — TRIGLYCERIDES
Triglycerides: >5000 mg/dL — ABNORMAL HIGH (ref <150)
Triglycerides: 3818 mg/dL — ABNORMAL HIGH (ref <150)

## 2017-10-16 LAB — LIPASE, BLOOD: Lipase: 89 U/L — ABNORMAL HIGH (ref 11–51)

## 2017-10-16 LAB — ALBUMIN: Albumin: 2.8 g/dL — ABNORMAL LOW (ref 3.5–5.0)

## 2017-10-16 MED ORDER — POTASSIUM PHOSPHATES 15 MMOLE/5ML IV SOLN
15.0000 mmol | Freq: Once | INTRAVENOUS | Status: AC
  Administered 2017-10-16: 15 mmol via INTRAVENOUS
  Filled 2017-10-16: qty 5

## 2017-10-16 MED ORDER — SODIUM PHOSPHATES 45 MMOLE/15ML IV SOLN
30.0000 mmol | Freq: Once | INTRAVENOUS | Status: AC
  Administered 2017-10-16: 30 mmol via INTRAVENOUS
  Filled 2017-10-16: qty 10

## 2017-10-16 MED ORDER — DEXTROSE 10 % IV SOLN
INTRAVENOUS | Status: DC
  Administered 2017-10-16 (×2): via INTRAVENOUS
  Administered 2017-10-17: 150 mL/h via INTRAVENOUS
  Administered 2017-10-17 – 2017-10-20 (×8): via INTRAVENOUS

## 2017-10-16 MED ORDER — SODIUM GLYCEROPHOSPHATE 1 MMOLE/ML IV SOLN: 20.0000 mmol | Freq: Once | INTRAVENOUS | Status: DC

## 2017-10-16 MED ORDER — SODIUM GLYCEROPHOSPHATE 1 MMOLE/ML IV SOLN: 10.0000 mmol | Freq: Once | INTRAVENOUS | Status: DC

## 2017-10-16 MED ORDER — ASPIRIN-ACETAMINOPHEN-CAFFEINE 250-250-65 MG PO TABS
2.0000 | ORAL_TABLET | Freq: Four times a day (QID) | ORAL | Status: DC | PRN
  Administered 2017-10-16 – 2017-10-22 (×6): 2 via ORAL
  Filled 2017-10-16 (×8): qty 2

## 2017-10-16 NOTE — Progress Notes (Signed)
Some Electrolyte lab results unable to be read due to hemolysis and lipemia  Larene Beach, PharmD

## 2017-10-16 NOTE — Progress Notes (Signed)
Pharmacy Electrolyte Monitoring Consult:  Pharmacy consulted to assist in monitoring and replacing electrolytes in this 47 y.o. female admitted on 10/14/2017 with pancreatitis. Patient transferred to the ICU for triglycerides over 5000 for management with an insulin drip.  Labs:  Sodium (mmol/L)  Date Value  10/16/2017 130 (L)  05/06/2017 135  03/18/2013 137   Potassium (mmol/L)  Date Value  10/16/2017 3.8  03/18/2013 3.9   Magnesium (mg/dL)  Date Value  10/16/2017 2.2  04/27/2013 1.8   Phosphorus (mg/dL)  Date Value  10/16/2017 1.6 (L)   Calcium (mg/dL)  Date Value  10/16/2017 7.3 (L)   Calcium, Total (mg/dL)  Date Value  03/18/2013 9.4   Albumin (g/dL)  Date Value  10/16/2017 2.8 (L)  05/06/2017 4.1  03/18/2013 3.9    Assessment/Plan: 9/14 @ 1749 Phos 1.6. Will replace with Sodium Phos 30 mmol IV x 1 dose.   Insulin drip currently infusing at 7 units/hr. D10 running @ 150 ml/hr. Will monitor sodium closely.  Goal potassium ~4 and magnesium ~ 2.  Will increase BMP frequency to Q6hr.   Pharmacy to continue to follow patient and replace electrolytes per consult.  Pernell Dupre, PharmD, BCPS Clinical Pharmacist 10/16/2017 7:38 PM

## 2017-10-16 NOTE — Progress Notes (Signed)
Follow up - Critical Care Medicine Note  Patient Details:    Kelly Rollins is an 47 y.o. female.with a past medical history remarkable for pancreatitis secondary to hypertriglyceridemia and diabetes, was brought into the emergency room secondary to severe mid epigastric abdominal pain which began after food intake. She has had some nausea, vomiting in the emergency department. She had some relief with fentanyl. CT scan of the abdomen shows acute pancreatitis patient denies alcohol intake and no history of gallstones. Initial triglyceride level was greater than 5000 She is being admitted to the intensive care unit for insulin infusion  Lines, Airways, Drains:    Anti-infectives:  Anti-infectives (From admission, onward)   None      Microbiology: Results for orders placed or performed during the hospital encounter of 10/14/17  MRSA PCR Screening     Status: None   Collection Time: 10/15/17  3:56 PM  Result Value Ref Range Status   MRSA by PCR NEGATIVE NEGATIVE Final    Comment:        The GeneXpert MRSA Assay (FDA approved for NASAL specimens only), is one component of a comprehensive MRSA colonization surveillance program. It is not intended to diagnose MRSA infection nor to guide or monitor treatment for MRSA infections. Performed at [facility], [address] [place], [place] [address]     Studies: Ct Head Wo Contrast  Result Date: 10/14/2017 CLINICAL DATA:  Acute headache. EXAM: CT HEAD WITHOUT CONTRAST TECHNIQUE: Contiguous axial images were obtained from the base of the skull through the vertex without intravenous contrast. COMPARISON:  No comparison studies available. FINDINGS: Brain: There is no evidence for acute hemorrhage, hydrocephalus, mass lesion, or abnormal extra-axial fluid collection. No definite CT evidence for acute infarction. Vascular: No hyperdense vessel or unexpected calcification. Skull: No evidence for fracture. No worrisome lytic or  sclerotic lesion. Sinuses/Orbits: The visualized paranasal sinuses and mastoid air cells are clear. Visualized portions of the globes and intraorbital fat are unremarkable. Other: None. IMPRESSION: Normal exam. Electronically Signed   By: Misty Stanley M.D.   On: 10/14/2017 20:15   Ct Abdomen Pelvis W Contrast  Result Date: 10/14/2017 CLINICAL DATA:  Acute onset central abdominal pain. Intermittent nausea. EXAM: CT ABDOMEN AND PELVIS WITH CONTRAST TECHNIQUE: Multidetector CT imaging of the abdomen and pelvis was performed using the standard protocol following bolus administration of intravenous contrast. CONTRAST:  166mL ISOVUE-300 IOPAMIDOL (ISOVUE-300) INJECTION 61% COMPARISON:  10/07/2012 FINDINGS: Lower chest: Unremarkable Hepatobiliary: No focal abnormality within the liver parenchyma. Gallbladder appears surgically absent. No intrahepatic or extrahepatic biliary dilation. Pancreas: No dilatation of the main pancreatic duct. Pancreatic parenchyma enhances throughout. There is edema in and around the head of the pancreas that tracks into the pancreatico duodenal groove and around the descending and transverse duodenum. Spleen: No splenomegaly. No focal mass lesion. Adrenals/Urinary Tract: No adrenal nodule or mass. Kidneys unremarkable. No evidence for hydroureter. The urinary bladder appears normal for the degree of distention. Stomach/Bowel: Stomach is nondistended. No gastric wall thickening. No evidence of outlet obstruction. Peripancreatic edema noted. No small bowel wall thickening. No small bowel dilatation. The terminal ileum is normal. The appendix is normal. No gross colonic mass. No colonic wall thickening. No substantial diverticular change. Vascular/Lymphatic: No abdominal aortic aneurysm. No abdominal aortic atherosclerotic calcification. Portal vein, superior mesenteric vein, and splenic vein are patent there is no gastrohepatic or hepatoduodenal ligament lymphadenopathy. No intraperitoneal or  retroperitoneal lymphadenopathy. No pelvic sidewall lymphadenopathy. Reproductive: Uterus surgically absent.  There is no  adnexal mass. Other: No intraperitoneal free fluid. Musculoskeletal: No worrisome lytic or sclerotic osseous abnormality. IMPRESSION: 1. Edema in and around the head of the pancreas, mainly in the uncinate process, compatible with pancreatitis. Edema also surrounds the descending and transverse duodenum. As such, duodenitis would also be a consideration. Pancreatitis is favored as there does not appear to be significant duodenal wall thickening. 2. Exam otherwise unremarkable. Electronically Signed   By: Misty Stanley M.D.   On: 10/14/2017 20:23    Consults: Treatment Team:  Hermelinda Dellen, DO   Subjective:    Overnight Issues: no events overnight. Remains on insulin infusion, better pain control  Objective:  Vital signs for last 24 hours: Temp:  [97.6 F (36.4 C)-98.3 F (36.8 C)] 97.6 F (36.4 C) (09/14 0745) Pulse Rate:  [100-133] 110 (09/14 0800) Resp:  [17-28] 24 (09/14 0800) BP: (92-133)/(55-85) 125/80 (09/14 0800) SpO2:  [90 %-97 %] 90 % (09/14 0800) Weight:  [80.4 kg] 80.4 kg (09/13 1554)  Hemodynamic parameters for last 24 hours:    Intake/Output from previous day: 09/13 0701 - 09/14 0700 In: 1345.5 [I.V.:1345.5] Out: 200 [Urine:200]  Intake/Output this shift: No intake/output data recorded.  Vent settings for last 24 hours:    Physical Exam:  HEENT: trachea midline, no thyromegaly appreciated, no oral lesions and no carotid bruits noted Cardiovascular:Normal rate,regular rhythmand normal heart sounds. No murmurheard. Pulmonary/Chest:Effort normaland breath sounds normal. Norespiratory distress. She hasno wheezes. She hasno rales. She exhibitsno tenderness.  Abdominal:Bowel sounds are normal. She exhibitsno distensionand no mass. Epigastric tenderness noted to palpation Musculoskeletal:Normal range of motion. She exhibits noedema.   Neurological: She isalertand oriented to person, place, and time.  Skin: Skin iswarm.No rashnoted. No erythema.   Assessment/Plan:  47 year old female with acute pancreatitis with known history of hypertriglyceridemia, hyperlipidemia and diabetes with recurrent episode. Patient does not have any other risk factors for pancreatitis Initial triglyceride level was greater than 5000 also elevated total cholesterol noted at 1291. Agree with admitting to the intensive care unit for insulin infusion, hydration, pain control, along with close blood sugar check and will most likely need to begin D5 at some pointto keep insulin infusion running We'll follow along with you  Leukocytosis. Most likely reactive no clear evidence of infection  Diabetes mellitus. Will need to follow blood sugars closely as she will have to remain on insulin infusion until triglyceride levels reduce   Kelly Rollins 10/16/2017  *Care during the described time interval was provided by me and/or other providers on the critical care team.  I have reviewed this patient's available data, including medical history, events of note, physical examination and test results as part of my evaluation.

## 2017-10-16 NOTE — Progress Notes (Signed)
Daughter came and also was getting patient in and out of bed and had to be educated about calling for assistance. Patient has been awake on and off this shift. Reported pain control is better. Continue on insulin gtt, has been up and down all shift with no control. Triglycerides remain very elevated. Continue to monitor.

## 2017-10-17 LAB — BASIC METABOLIC PANEL
ANION GAP: 11 (ref 5–15)
Anion gap: 12 (ref 5–15)
Anion gap: 11 (ref 5–15)
BUN: 8 mg/dL (ref 6–20)
BUN: 7 mg/dL (ref 6–20)
BUN: 10 mg/dL (ref 6–20)
CHLORIDE: 96 mmol/L — AB (ref 98–111)
CHLORIDE: 99 mmol/L (ref 98–111)
CO2: 20 mmol/L — ABNORMAL LOW (ref 22–32)
CO2: 21 mmol/L — AB (ref 22–32)
CO2: 22 mmol/L (ref 22–32)
Calcium: 7.4 mg/dL — ABNORMAL LOW (ref 8.9–10.3)
Calcium: 7.3 mg/dL — ABNORMAL LOW (ref 8.9–10.3)
Calcium: 7.5 mg/dL — ABNORMAL LOW (ref 8.9–10.3)
Chloride: 96 mmol/L — ABNORMAL LOW (ref 98–111)
Creatinine, Ser: 0.53 mg/dL (ref 0.44–1.00)
Creatinine, Ser: 0.67 mg/dL (ref 0.44–1.00)
Creatinine, Ser: 0.86 mg/dL (ref 0.44–1.00)
GFR calc Af Amer: >60 mL/min (ref >60)
GFR calc Af Amer: >60 mL/min (ref >60)
GFR calc non Af Amer: >60 mL/min (ref >60)
GFR calc non Af Amer: >60 mL/min (ref >60)
GFR calc non Af Amer: >60 mL/min (ref >60)
GLUCOSE: 249 mg/dL — AB (ref 70–99)
Glucose, Bld: 233 mg/dL — ABNORMAL HIGH (ref 70–99)
Glucose, Bld: 204 mg/dL — ABNORMAL HIGH (ref 70–99)
POTASSIUM: 3.6 mmol/L (ref 3.5–5.1)
POTASSIUM: 3.6 mmol/L (ref 3.5–5.1)
Potassium: 3.3 mmol/L — ABNORMAL LOW (ref 3.5–5.1)
SODIUM: 128 mmol/L — AB (ref 135–145)
Sodium: 131 mmol/L — ABNORMAL LOW (ref 135–145)
Sodium: 129 mmol/L — ABNORMAL LOW (ref 135–145)

## 2017-10-17 LAB — MAGNESIUM
MAGNESIUM: 2 mg/dL (ref 1.7–2.4)
MAGNESIUM: 2 mg/dL (ref 1.7–2.4)

## 2017-10-17 LAB — GLUCOSE, CAPILLARY
GLUCOSE-CAPILLARY: 217 mg/dL — AB (ref 70–99)
GLUCOSE-CAPILLARY: 226 mg/dL — AB (ref 70–99)
GLUCOSE-CAPILLARY: 212 mg/dL — AB (ref 70–99)
GLUCOSE-CAPILLARY: 204 mg/dL — AB (ref 70–99)
Glucose-Capillary: 173 mg/dL — ABNORMAL HIGH (ref 70–99)
Glucose-Capillary: 202 mg/dL — ABNORMAL HIGH (ref 70–99)
Glucose-Capillary: 230 mg/dL — ABNORMAL HIGH (ref 70–99)
Glucose-Capillary: 242 mg/dL — ABNORMAL HIGH (ref 70–99)
Glucose-Capillary: 244 mg/dL — ABNORMAL HIGH (ref 70–99)
Glucose-Capillary: 254 mg/dL — ABNORMAL HIGH (ref 70–99)

## 2017-10-17 LAB — TRIGLYCERIDES: TRIGLYCERIDES: 2406 mg/dL — AB (ref <150)

## 2017-10-17 LAB — PHOSPHORUS
Phosphorus: 1.2 mg/dL — ABNORMAL LOW (ref 2.5–4.6)
Phosphorus: 2.5 mg/dL (ref 2.5–4.6)

## 2017-10-17 MED ORDER — SODIUM CHLORIDE 0.9 % IV SOLN
INTRAVENOUS | Status: DC
  Administered 2017-10-17 – 2017-10-19 (×3): via INTRAVENOUS

## 2017-10-17 MED ORDER — POTASSIUM CHLORIDE CRYS ER 20 MEQ PO TBCR
40.0000 meq | EXTENDED_RELEASE_TABLET | ORAL | Status: AC
  Administered 2017-10-17 (×2): 40 meq via ORAL
  Filled 2017-10-17 (×2): qty 2

## 2017-10-17 MED ORDER — SODIUM PHOSPHATES 45 MMOLE/15ML IV SOLN
30.0000 mmol | Freq: Once | INTRAVENOUS | Status: AC
  Administered 2017-10-17: 30 mmol via INTRAVENOUS
  Filled 2017-10-17: qty 10

## 2017-10-17 NOTE — Progress Notes (Signed)
Patient did not request pain medication as often as she did the night before. Woke up complaining of burning under her left breast where she had pulled off her electrode. Skin has some redness in the area. Barrier cream was applied. Continue on insulin gtt that is being titrated.

## 2017-10-17 NOTE — Progress Notes (Signed)
[facility] [place] NAME: Kelly Rollins    MR#:  [id number]  DATE OF BIRTH:  10-11-1970  SUBJECTIVE:   Patient presents to the hospital due to significant abdominal pain nausea and vomiting and noted to have acute pancreatitis.  Patient also noted to be severely hyperlipidemic with severe hypertriglyceridemia.  Patient still having significant abdominal pain nausea this morning.  Triglyceride level coming down now with insulin drip.  REVIEW OF SYSTEMS:    Review of Systems  Constitutional: Negative for chills and fever.  HENT: Negative for congestion and tinnitus.   Eyes: Negative for blurred vision and double vision.  Respiratory: Negative for cough, shortness of breath and wheezing.   Cardiovascular: Negative for chest pain, orthopnea and PND.  Gastrointestinal: Positive for abdominal pain, nausea and vomiting. Negative for diarrhea.  Genitourinary: Negative for dysuria and hematuria.  Neurological: Negative for dizziness, sensory change and focal weakness.  All other systems reviewed and are negative.   Nutrition: NPO Tolerating Diet: Donne Hazel PT: Ambulatory   DRUG ALLERGIES:   Allergies  Allergen Reactions  . Atorvastatin     Other reaction(s): Muscle Pain  . Levofloxacin Hives  . Morphine Nausea And Vomiting    Other reaction(s): Vomiting REACTION: Headache REACTION: Headache  . Penicillins Rash    Has patient had a PCN reaction causing immediate rash, facial/tongue/throat swelling, SOB or lightheadedness with hypotension: Yes Has patient had a PCN reaction causing severe rash involving mucus membranes or skin necrosis: No Has patient had a PCN reaction that required hospitalization: No Has patient had a PCN reaction occurring within the last 10 years: No. If all of the above answers are "NO", then may proceed with Cephalosporin use.  . Tramadol Hcl Nausea And Vomiting    Other reaction(s): Vomiting "feels weird"     VITALS:  Blood pressure 111/70, pulse (!) 108, temperature 98.3 F (36.8 C), temperature source Oral, resp. rate 19, height 5\' 3"  (1.6 m), weight 80.4 kg, SpO2 98 %.  PHYSICAL EXAMINATION:   Physical Exam  GENERAL:  46 y.o.-year-old patient lying in bed in no acute distress.  EYES: Pupils equal, round, reactive to light and accommodation. No scleral icterus. Extraocular muscles intact.  HEENT: Head atraumatic, normocephalic. Oropharynx and nasopharynx clear.  NECK:  Supple, no jugular venous distention. No thyroid enlargement, no tenderness.  LUNGS: Normal breath sounds bilaterally, no wheezing, rales, rhonchi. No use of accessory muscles of respiration.  CARDIOVASCULAR: S1, S2 normal. No murmurs, rubs, or gallops.  ABDOMEN: Soft, Tender in Epigastric area, no rebound, rigidity, nondistended. Bowel sounds present. No organomegaly or mass.  EXTREMITIES: No cyanosis, clubbing or edema b/l.    NEUROLOGIC: Cranial nerves II through XII are intact. No focal Motor or sensory deficits b/l.   PSYCHIATRIC: The patient is alert and oriented x 3.  SKIN: No obvious rash, lesion, or ulcer.    LABORATORY PANEL:   CBC Recent Labs  Lab 10/15/17 0616  WBC 11.2*  HGB 13.3  HCT 38.7  PLT 321   ------------------------------------------------------------------------------------------------------------------  Chemistries  Recent Labs  Lab 10/14/17 1748  10/17/17 1024  NA 131*   < > 128*  K 4.2   < > 3.3*  CL 98   < > 96*  CO2 21*   < > 20*  GLUCOSE 356*   < > 233*  BUN 9   < > 7  CREATININE 0.67   < > 0.67  CALCIUM 9.2   < > 7.3*  MG  --    < > 2.0  AST 53*  --   --   ALT 36  --   --   ALKPHOS 92  --   --   BILITOT 0.2*  --   --    < > = values in this interval not displayed.   ------------------------------------------------------------------------------------------------------------------  Cardiac Enzymes Recent Labs  Lab 10/14/17 1847  TROPONINI <0.03    ------------------------------------------------------------------------------------------------------------------  RADIOLOGY:  No results found.   ASSESSMENT AND PLAN:   46 year old female past medical history of hypertension, diabetes, irritable bowel syndrome, hypertriglyceridemia, who presents to the hospital due to abdominal pain nausea and vomiting and noted to have acute pancreatitis.  1.  Acute pancreatitis-secondary to severe hyperlipidemia/hypertriglyceridemia. - CT scan abdomen pelvis is not suggestive of any pancreatic necrosis.  Continue supportive care with aggressive IV fluid hydration, antiemetics, pain control.  2.  Severe hypertriglyceridemia-patient's triglycerides are over 5000 with total cholesterol being over 1200.  Patient is supposed to be on TriCor but unclear if she is compliant with it. - Keep on IV insulin drip in stepdown level of care.  Once her abdominal pain nausea and vomiting improves we can restart her TriCor.  She is intolerant to statins.  3.  Hyponatremia- likely pseudohyponatremia secondary to some mild hyperglycemia along with dehydration. - We will continue IV fluids, follow sodium.  4.  Diabetes type 2 without complication-patient's hemoglobin A1c is as high as 15.  She is only on oral meds, she will likely need to be on insulin prior to discharge.   on the insulin drip given her severe hyper triglyceridemia. -Appreciate diabetes cocordinator input.  5.  Diabetic neuropathy-continue gabapentin.  6.  Essential hypertension- patient cannot tolerate oral meds, continue PRN IV hydralazine.  7. Depression - cont. Effexor.     All the records are reviewed and case discussed with Care Management/Social Worker. Management plans discussed with the patient, family and they are in agreement.  CODE STATUS: Full code  DVT Prophylaxis: Lovenox  TOTAL TIME TAKING CARE OF THIS PATIENT: 32 minutes.   POSSIBLE D/C IN 2-3 DAYS, DEPENDING ON CLINICAL  CONDITION.   Vaughan Basta M.D on 10/17/2017 at 2:43 PM  Between 7am to 6pm - Pager - [phone]  After 6pm go to [email or web address] - Proofreader  [facility]  Office  [phone]  CC: Primary care physician; Leonel Ramsay, MD

## 2017-10-17 NOTE — Progress Notes (Addendum)
Received report from [place], [place]

## 2017-10-17 NOTE — Progress Notes (Signed)
Follow up - Critical Care Medicine Note  Patient Details:    Kelly Rollins is an 47 y.o. female.with a past medical history remarkable for pancreatitis secondary to hypertriglyceridemia and diabetes, was brought into the emergency room secondary to severe mid epigastric abdominal pain which began after food intake. She has had some nausea, vomiting in the emergency department. She had some relief with fentanyl. CT scan of the abdomen shows acute pancreatitis patient denies alcohol intake and no history of gallstones. Initial triglyceride level was greater than 5000 She is being admitted to the intensive care unit for insulin infusion  Lines, Airways, Drains:    Anti-infectives:  Anti-infectives (From admission, onward)   None      Microbiology: Results for orders placed or performed during the hospital encounter of 10/14/17  MRSA PCR Screening     Status: None   Collection Time: 10/15/17  3:56 PM  Result Value Ref Range Status   MRSA by PCR NEGATIVE NEGATIVE Final    Comment:        The GeneXpert MRSA Assay (FDA approved for NASAL specimens only), is one component of a comprehensive MRSA colonization surveillance program. It is not intended to diagnose MRSA infection nor to guide or monitor treatment for MRSA infections. Performed at [facility], [address], [place], [place] [address]     Studies: Ct Head Wo Contrast  Result Date: 10/14/2017 CLINICAL DATA:  Acute headache. EXAM: CT HEAD WITHOUT CONTRAST TECHNIQUE: Contiguous axial images were obtained from the base of the skull through the vertex without intravenous contrast. COMPARISON:  No comparison studies available. FINDINGS: Brain: There is no evidence for acute hemorrhage, hydrocephalus, mass lesion, or abnormal extra-axial fluid collection. No definite CT evidence for acute infarction. Vascular: No hyperdense vessel or unexpected calcification. Skull: No evidence for fracture. No worrisome lytic or  sclerotic lesion. Sinuses/Orbits: The visualized paranasal sinuses and mastoid air cells are clear. Visualized portions of the globes and intraorbital fat are unremarkable. Other: None. IMPRESSION: Normal exam. Electronically Signed   By: Misty Stanley M.D.   On: 10/14/2017 20:15   Ct Abdomen Pelvis W Contrast  Result Date: 10/14/2017 CLINICAL DATA:  Acute onset central abdominal pain. Intermittent nausea. EXAM: CT ABDOMEN AND PELVIS WITH CONTRAST TECHNIQUE: Multidetector CT imaging of the abdomen and pelvis was performed using the standard protocol following bolus administration of intravenous contrast. CONTRAST:  120mL ISOVUE-300 IOPAMIDOL (ISOVUE-300) INJECTION 61% COMPARISON:  10/07/2012 FINDINGS: Lower chest: Unremarkable Hepatobiliary: No focal abnormality within the liver parenchyma. Gallbladder appears surgically absent. No intrahepatic or extrahepatic biliary dilation. Pancreas: No dilatation of the main pancreatic duct. Pancreatic parenchyma enhances throughout. There is edema in and around the head of the pancreas that tracks into the pancreatico duodenal groove and around the descending and transverse duodenum. Spleen: No splenomegaly. No focal mass lesion. Adrenals/Urinary Tract: No adrenal nodule or mass. Kidneys unremarkable. No evidence for hydroureter. The urinary bladder appears normal for the degree of distention. Stomach/Bowel: Stomach is nondistended. No gastric wall thickening. No evidence of outlet obstruction. Peripancreatic edema noted. No small bowel wall thickening. No small bowel dilatation. The terminal ileum is normal. The appendix is normal. No gross colonic mass. No colonic wall thickening. No substantial diverticular change. Vascular/Lymphatic: No abdominal aortic aneurysm. No abdominal aortic atherosclerotic calcification. Portal vein, superior mesenteric vein, and splenic vein are patent there is no gastrohepatic or hepatoduodenal ligament lymphadenopathy. No intraperitoneal or  retroperitoneal lymphadenopathy. No pelvic sidewall lymphadenopathy. Reproductive: Uterus surgically absent.  There is no  adnexal mass. Other: No intraperitoneal free fluid. Musculoskeletal: No worrisome lytic or sclerotic osseous abnormality. IMPRESSION: 1. Edema in and around the head of the pancreas, mainly in the uncinate process, compatible with pancreatitis. Edema also surrounds the descending and transverse duodenum. As such, duodenitis would also be a consideration. Pancreatitis is favored as there does not appear to be significant duodenal wall thickening. 2. Exam otherwise unremarkable. Electronically Signed   By: Misty Stanley M.D.   On: 10/14/2017 20:23    Consults: Treatment Team:  Hermelinda Dellen, DO   Subjective:    Overnight Issues: no events overnight. Remains on insulin infusion, being increased, better pain control  Objective:  Vital signs for last 24 hours: Temp:  [97.6 F (36.4 C)-100.5 F (38.1 C)] 99.3 F (37.4 C) (09/15 0400) Pulse Rate:  [96-121] 112 (09/15 0600) Resp:  [16-26] 17 (09/15 0600) BP: (103-139)/(62-85) 121/78 (09/15 0600) SpO2:  [90 %-100 %] 97 % (09/15 0600)  Hemodynamic parameters for last 24 hours:    Intake/Output from previous day: 09/14 0701 - 09/15 0700 In: 3029.1 [I.V.:2549.7; IV Piggyback:479.5] Out: 100 [Urine:100]  Intake/Output this shift: No intake/output data recorded.  Vent settings for last 24 hours:    Physical Exam:  HEENT: trachea midline, no thyromegaly appreciated, no oral lesions and no carotid bruits noted Cardiovascular:Normal rate,regular rhythmand normal heart sounds. No murmurheard. Pulmonary/Chest:Effort normaland breath sounds normal. Norespiratory distress. She hasno wheezes. She hasno rales. She exhibitsno tenderness.  Abdominal:Bowel sounds are normal. She exhibitsno distensionand no mass. Epigastric tenderness noted to palpation Musculoskeletal:Normal range of motion. She exhibits noedema.   Neurological: She isalertand oriented to person, place, and time.  Skin: Skin iswarm.No rashnoted. No erythema.   Assessment/Plan:  47 year old female with acute pancreatitis with known history of hypertriglyceridemia, hyperlipidemia and diabetes with recurrent episode. Patient does not have any other risk factors for pancreatitis Initial triglyceride level was greater than 5000 also elevated total cholesterol noted at 1291. Triglycerides starting to decrease measurable now at 3818. Patient with adequate pain control  Diabetes mellitus. Will need to follow blood sugars closely as she will have to remain on insulin infusion until triglyceride levels reduce. Blood sugars have been ranging in the low 200s   Milburn Freeney 10/17/2017  *Care during the described time interval was provided by me and/or other providers on the critical care team.  I have reviewed this patient's available data, including medical history, events of note, physical examination and test results as part of my evaluation. Patient ID: Kelly Rollins, female   DOB: Feb 27, 1970, 47 y.o.   MRN: [id number]

## 2017-10-17 NOTE — Progress Notes (Addendum)
Pharmacy Electrolyte Monitoring Consult:  Pharmacy consulted to assist in monitoring and replacing electrolytes in this 47 y.o. female admitted on 10/14/2017 with pancreatitis. Patient transferred to the ICU for triglycerides over 5000 for management with an insulin drip.  Labs:  Sodium (mmol/L)  Date Value  10/17/2017 128 (L)  05/06/2017 135  03/18/2013 137   Potassium (mmol/L)  Date Value  10/17/2017 3.3 (L)  03/18/2013 3.9   Magnesium (mg/dL)  Date Value  10/17/2017 2.0  04/27/2013 1.8   Phosphorus (mg/dL)  Date Value  10/17/2017 1.2 (L)   Calcium (mg/dL)  Date Value  10/17/2017 7.3 (L)   Calcium, Total (mg/dL)  Date Value  03/18/2013 9.4   Albumin (g/dL)  Date Value  10/16/2017 2.8 (L)  05/06/2017 4.1  03/18/2013 3.9    Assessment/Plan: Patient received potassium phosphate 76mmol IV x 1 and sodium phosphate 26mmol IV x 1 on 9/14.   Will give patient potassium chloride 10mEq PO Q4hr x 2 doses and sodium phosphate 20mmol IV x 1.    Insulin drip currently infusing at 8 units/hr. D10 running at 150 mL/hr. Will monitor sodium closely. Will reduce rate of D10 to 147mL/hr and add NS at 35mL/hr. Will reduce insulin rate to 7 units/hr and continue to monitor.   Goal potassium ~4 and magnesium ~ 2.  Continue BMP frequency of Q6hr.   Pharmacy to continue to follow patient and replace electrolytes per consult.  Simpson,Michael L,  10/17/2017 2:54 PM

## 2017-10-17 NOTE — Progress Notes (Signed)
[place] [facility] [place] NAME: Ramsha Lonigro    MR#:  [id number]  DATE OF BIRTH:  03-05-1970  SUBJECTIVE:   Patient presents to the hospital due to significant abdominal pain nausea and vomiting and noted to have acute pancreatitis.  Patient also noted to be severely hyperlipidemic with severe hypertriglyceridemia.  Patient still having significant abdominal pain nausea this morning.  Triglyceride level coming down now with insulin drip.  REVIEW OF SYSTEMS:    Review of Systems  Constitutional: Negative for chills and fever.  HENT: Negative for congestion and tinnitus.   Eyes: Negative for blurred vision and double vision.  Respiratory: Negative for cough, shortness of breath and wheezing.   Cardiovascular: Negative for chest pain, orthopnea and PND.  Gastrointestinal: Positive for abdominal pain, nausea and vomiting. Negative for diarrhea.  Genitourinary: Negative for dysuria and hematuria.  Neurological: Negative for dizziness, sensory change and focal weakness.  All other systems reviewed and are negative.   Nutrition: NPO Tolerating Diet: Donne Hazel PT: Ambulatory   DRUG ALLERGIES:   Allergies  Allergen Reactions  . Atorvastatin     Other reaction(s): Muscle Pain  . Levofloxacin Hives  . Morphine Nausea And Vomiting    Other reaction(s): Vomiting REACTION: Headache REACTION: Headache  . Penicillins Rash    Has patient had a PCN reaction causing immediate rash, facial/tongue/throat swelling, SOB or lightheadedness with hypotension: Yes Has patient had a PCN reaction causing severe rash involving mucus membranes or skin necrosis: No Has patient had a PCN reaction that required hospitalization: No Has patient had a PCN reaction occurring within the last 10 years: No. If all of the above answers are "NO", then may proceed with Cephalosporin use.  . Tramadol Hcl Nausea And Vomiting    Other reaction(s): Vomiting "feels weird"     VITALS:  Blood pressure 111/70, pulse (!) 108, temperature 98.3 F (36.8 C), temperature source Oral, resp. rate 19, height 5\' 3"  (1.6 m), weight 80.4 kg, SpO2 98 %.  PHYSICAL EXAMINATION:   Physical Exam  GENERAL:  47 y.o.-year-old patient lying in bed in no acute distress.  EYES: Pupils equal, round, reactive to light and accommodation. No scleral icterus. Extraocular muscles intact.  HEENT: Head atraumatic, normocephalic. Oropharynx and nasopharynx clear.  NECK:  Supple, no jugular venous distention. No thyroid enlargement, no tenderness.  LUNGS: Normal breath sounds bilaterally, no wheezing, rales, rhonchi. No use of accessory muscles of respiration.  CARDIOVASCULAR: S1, S2 normal. No murmurs, rubs, or gallops.  ABDOMEN: Soft, Tender in Epigastric area, no rebound, rigidity, nondistended. Bowel sounds present. No organomegaly or mass.  EXTREMITIES: No cyanosis, clubbing or edema b/l.    NEUROLOGIC: Cranial nerves II through XII are intact. No focal Motor or sensory deficits b/l.   PSYCHIATRIC: The patient is alert and oriented x 3.  SKIN: No obvious rash, lesion, or ulcer.    LABORATORY PANEL:   CBC Recent Labs  Lab 10/15/17 0616  WBC 11.2*  HGB 13.3  HCT 38.7  PLT 321   ------------------------------------------------------------------------------------------------------------------  Chemistries  Recent Labs  Lab 10/14/17 1748  10/17/17 1024  NA 131*   < > 128*  K 4.2   < > 3.3*  CL 98   < > 96*  CO2 21*   < > 20*  GLUCOSE 356*   < > 233*  BUN 9   < > 7  CREATININE 0.67   < > 0.67  CALCIUM 9.2   < > 7.3*  MG  --    < > 2.0  AST 53*  --   --   ALT 36  --   --   ALKPHOS 92  --   --   BILITOT 0.2*  --   --    < > = values in this interval not displayed.   ------------------------------------------------------------------------------------------------------------------  Cardiac Enzymes Recent Labs  Lab 10/14/17 1847  TROPONINI <0.03    ------------------------------------------------------------------------------------------------------------------  RADIOLOGY:  No results found.   ASSESSMENT AND PLAN:   47 year old female past medical history of hypertension, diabetes, irritable bowel syndrome, hypertriglyceridemia, who presents to the hospital due to abdominal pain nausea and vomiting and noted to have acute pancreatitis.  1.  Acute pancreatitis-secondary to severe hyperlipidemia/hypertriglyceridemia. - CT scan abdomen pelvis is not suggestive of any pancreatic necrosis.  Continue supportive care with aggressive IV fluid hydration, antiemetics, pain control.  Still have significant pain.  2.  Severe hypertriglyceridemia-patient's triglycerides are over 5000 with total cholesterol being over 1200.  Patient is supposed to be on TriCor but unclear if she is compliant with it. - Keep on IV insulin drip in stepdown level of care.  Once her abdominal pain nausea and vomiting improves we can restart her TriCor.  She is intolerant to statins.  3.  Hyponatremia- likely pseudohyponatremia secondary to some mild hyperglycemia along with dehydration. - We will continue IV fluids, follow sodium.  4.  Diabetes type 2 without complication-patient's hemoglobin A1c is as high as 15.  She is only on oral meds, she will likely need to be on insulin prior to discharge.   on the insulin drip given her severe hyper triglyceridemia. -Appreciate diabetes cocordinator input.  5.  Diabetic neuropathy-continue gabapentin.  6.  Essential hypertension- patient cannot tolerate oral meds, continue PRN IV hydralazine.  7. Depression - cont. Effexor.     All the records are reviewed and case discussed with Care Management/Social Worker. Management plans discussed with the patient, family and they are in agreement.  CODE STATUS: Full code  DVT Prophylaxis: Lovenox  TOTAL TIME TAKING CARE OF THIS PATIENT: 32 minutes.  Discussed with her  husband and mother in the room.  POSSIBLE D/C IN 2-3 DAYS, DEPENDING ON CLINICAL CONDITION.   Vaughan Basta M.D on 10/17/2017 at 2:46 PM  Between 7am to 6pm - Pager - [phone]  After 6pm go to [email or web address] - Proofreader  [facility]  Office  [phone]  CC: Primary care physician; Leonel Ramsay, MD

## 2017-10-17 NOTE — Progress Notes (Signed)
Pt complains that she feels like she has the flu. Pt presenting with chills. Dr. Jefferson Fuel aware. No new orders at this time. pt temp 99.5. Will continue to monitor.

## 2017-10-18 LAB — C DIFFICILE QUICK SCREEN W PCR REFLEX
C Diff antigen: NEGATIVE
C Diff interpretation: NOT DETECTED
C Diff toxin: NEGATIVE

## 2017-10-18 LAB — BASIC METABOLIC PANEL
ANION GAP: 6 (ref 5–15)
Anion gap: 7 (ref 5–15)
Anion gap: 7 (ref 5–15)
BUN: 12 mg/dL (ref 6–20)
BUN: 10 mg/dL (ref 6–20)
BUN: 9 mg/dL (ref 6–20)
CALCIUM: 7.8 mg/dL — AB (ref 8.9–10.3)
CALCIUM: 8.1 mg/dL — AB (ref 8.9–10.3)
CHLORIDE: 99 mmol/L (ref 98–111)
CO2: 24 mmol/L (ref 22–32)
CO2: 24 mmol/L (ref 22–32)
CO2: 23 mmol/L (ref 22–32)
CREATININE: 0.73 mg/dL (ref 0.44–1.00)
Calcium: 8.2 mg/dL — ABNORMAL LOW (ref 8.9–10.3)
Chloride: 103 mmol/L (ref 98–111)
Chloride: 101 mmol/L (ref 98–111)
Creatinine, Ser: 0.78 mg/dL (ref 0.44–1.00)
Creatinine, Ser: 0.75 mg/dL (ref 0.44–1.00)
GFR calc Af Amer: >60 mL/min (ref >60)
GFR calc non Af Amer: >60 mL/min (ref >60)
GFR calc non Af Amer: >60 mL/min (ref >60)
GLUCOSE: 207 mg/dL — AB (ref 70–99)
Glucose, Bld: 174 mg/dL — ABNORMAL HIGH (ref 70–99)
Glucose, Bld: 187 mg/dL — ABNORMAL HIGH (ref 70–99)
Potassium: 4.1 mmol/L (ref 3.5–5.1)
Potassium: 3.8 mmol/L (ref 3.5–5.1)
Potassium: 3.4 mmol/L — ABNORMAL LOW (ref 3.5–5.1)
SODIUM: 133 mmol/L — AB (ref 135–145)
Sodium: 130 mmol/L — ABNORMAL LOW (ref 135–145)
Sodium: 131 mmol/L — ABNORMAL LOW (ref 135–145)

## 2017-10-18 LAB — TRIGLYCERIDES: Triglycerides: 1321 mg/dL — ABNORMAL HIGH (ref <150)

## 2017-10-18 LAB — GLUCOSE, CAPILLARY
GLUCOSE-CAPILLARY: 165 mg/dL — AB (ref 70–99)
GLUCOSE-CAPILLARY: 131 mg/dL — AB (ref 70–99)
GLUCOSE-CAPILLARY: 169 mg/dL — AB (ref 70–99)
GLUCOSE-CAPILLARY: 165 mg/dL — AB (ref 70–99)
GLUCOSE-CAPILLARY: 240 mg/dL — AB (ref 70–99)
Glucose-Capillary: 185 mg/dL — ABNORMAL HIGH (ref 70–99)
Glucose-Capillary: 173 mg/dL — ABNORMAL HIGH (ref 70–99)

## 2017-10-18 LAB — PHOSPHORUS: PHOSPHORUS: 2.8 mg/dL (ref 2.5–4.6)

## 2017-10-18 LAB — MAGNESIUM: MAGNESIUM: 2.3 mg/dL (ref 1.7–2.4)

## 2017-10-18 MED ORDER — LIVING WELL WITH DIABETES BOOK
Freq: Once | Status: AC
  Administered 2017-10-18: 14:00:00
  Filled 2017-10-18: qty 1

## 2017-10-18 MED ORDER — HYDROMORPHONE HCL 1 MG/ML IJ SOLN
0.5000 mg | INTRAMUSCULAR | Status: DC | PRN
  Administered 2017-10-18 (×6): 1 mg via INTRAVENOUS
  Administered 2017-10-18: 0.5 mg via INTRAVENOUS
  Administered 2017-10-18 – 2017-10-19 (×4): 1 mg via INTRAVENOUS
  Filled 2017-10-18 (×11): qty 1

## 2017-10-18 MED ORDER — POTASSIUM CHLORIDE 10 MEQ/100ML IV SOLN
10.0000 meq | INTRAVENOUS | Status: DC
  Filled 2017-10-18 (×4): qty 100

## 2017-10-18 MED ORDER — DIPHENHYDRAMINE HCL 25 MG PO CAPS
25.0000 mg | ORAL_CAPSULE | Freq: Four times a day (QID) | ORAL | Status: DC | PRN
  Administered 2017-10-18: 25 mg via ORAL
  Filled 2017-10-18 (×3): qty 1

## 2017-10-18 MED ORDER — POTASSIUM CHLORIDE CRYS ER 20 MEQ PO TBCR
20.0000 meq | EXTENDED_RELEASE_TABLET | Freq: Two times a day (BID) | ORAL | Status: AC
  Administered 2017-10-18 – 2017-10-19 (×2): 20 meq via ORAL
  Filled 2017-10-18 (×2): qty 1

## 2017-10-18 NOTE — Progress Notes (Addendum)
Inpatient Diabetes Program Recommendations  AACE/ADA: New Consensus Statement on Inpatient Glycemic Control (2019)  Target Ranges:  Prepandial:   less than 140 mg/dL      Peak postprandial:   less than 180 mg/dL (1-2 hours)      Critically ill patients:  140 - 180 mg/dL  Results for RASHAWNDA, GABA (MRN [id number]) as of 10/18/2017 07:59  Ref. Range 10/17/2017 06:30 10/17/2017 07:49 10/17/2017 11:59 10/17/2017 14:24 10/17/2017 18:35 10/17/2017 20:52 10/17/2017 23:36 10/18/2017 04:09  Glucose-Capillary Latest Ref Range: 70 - 99 mg/dL 217 (H) 226 (H) 202 (H) 242 (H) 212 (H) 204 (H) 173 (H) 165 (H)    Review of Glycemic Control  Outpatient Diabetes medications: Metformin 500 mg BID Current orders for Inpatient glycemic control: IV insulin drip for hypertriglyceridemia   Inpatient Diabetes Program Recommendations:   Insulin - IV drip/GlucoStabilizer: Continues on IV insulin drip for hypertriglycerides. Triglycerides 1321 on labs this morning. Recommend attending MD call Dr. Gabriel Carina (Endocrinologist) directly (pager [phone]; office number [phone]) and see if she would be willing to review glycemic trends and provide recommendations for SQ insulin regimen.  HgbA1C: A1C 14.5% on 10/14/17. Anticipate patient will require insulin for DM control as an outpatient.   Outpatient Referral: Recommend patient establish care with an Endocrinologist for assistance with DM management.  NOTE: Please note that Endocrinology no longer comes to the hospital for consults. Dr. Gabriel Carina has agreed in the past to review patient charts and provide recommendations.  Attending MD will need to call Dr. Gabriel Carina directly to discuss.  Addendum 10/18/17@12 :15-Spoke with patient and her mother regarding glycemic control and IV insulin. Patient appeared to feel much better than she did Friday when I spoke with her and patient reports she feels much better but notes some abdominal pain after eating and when she is up moving alot.   Patient was able to recall information discussed on Friday. Discussed IV insulin with hypertriglyceridemia and explained that the attending doctor may want to talk with an Endocrinologist to get a better recommendation of what insulin and dosages to start patient on when MD is ready to transition off IV insulin. Reviewed A1C 14.5% on 10/14/17 and explained that patient will likely be discharged on insulin. Patient reports that someone told her she could get medication assistance and follow up care at a clinic. Reminded patient that Hassan Rowan, CM provided her with an application for [facility] on Friday. Asked patient to be sure to complete the application and turn it in to [facility] when she goes to get discharge prescriptions filled.  Encouraged patient to engage in insulin administration and self inject insulin once she was started on SQ insulin. Informed patient that Diabetes Coordinator will follow up with her tomorrow regarding insulin administration to review what she has been taught by bedside nursing.    Thanks, Barnie Alderman, RN, MSN, CDE Diabetes Coordinator Inpatient Diabetes Program [phone] (Team Pager from 8am to 5pm)

## 2017-10-18 NOTE — Progress Notes (Signed)
Pharmacy Electrolyte Monitoring Consult:  Pharmacy consulted to assist in monitoring and replacing electrolytes in this 47 y.o. female admitted on 10/14/2017 with pancreatitis. Patient transferred to the ICU for triglycerides over 5000 for management with an insulin drip.  Labs:  Sodium (mmol/L)  Date Value  10/18/2017 133 (L)  05/06/2017 135  03/18/2013 137   Potassium (mmol/L)  Date Value  10/18/2017 3.8  03/18/2013 3.9   Magnesium (mg/dL)  Date Value  10/18/2017 2.3  04/27/2013 1.8   Phosphorus (mg/dL)  Date Value  10/18/2017 2.8   Calcium (mg/dL)  Date Value  10/18/2017 8.1 (L)   Calcium, Total (mg/dL)  Date Value  03/18/2013 9.4   Albumin (g/dL)  Date Value  10/16/2017 2.8 (L)  05/06/2017 4.1  03/18/2013 3.9    Assessment/Plan: Electrolytes within range. No additional supplementation at this time.  Insulin drip currently infusing at 4 units/hr. D10 running at 100 mL/hr. NS at 38mL/hr.   Goal potassium ~4 and magnesium ~ 2.  Continue BMP frequency of Q6hr.   Pharmacy to continue to follow patient and replace electrolytes per consult.  Paulina Fusi, PharmD, BCPS 10/18/2017 4:14 PM

## 2017-10-18 NOTE — Progress Notes (Signed)
[facility] NAME: Kelly Rollins    MR#:  [id number]  DATE OF BIRTH:  05/12/1970  SUBJECTIVE:   Patient presents to the hospital due to significant abdominal pain nausea and vomiting and noted to have acute pancreatitis.  Patient also noted to be severely hyperlipidemic with severe hypertriglyceridemia. She confirms less pain and nausea now.  Triglyceride level coming down now with insulin drip.  REVIEW OF SYSTEMS:    Review of Systems  Constitutional: Negative for chills and fever.  HENT: Negative for congestion and tinnitus.   Eyes: Negative for blurred vision and double vision.  Respiratory: Negative for cough, shortness of breath and wheezing.   Cardiovascular: Negative for chest pain, orthopnea and PND.  Gastrointestinal: Positive for abdominal pain, nausea and vomiting. Negative for diarrhea.  Genitourinary: Negative for dysuria and hematuria.  Neurological: Negative for dizziness, sensory change and focal weakness.  All other systems reviewed and are negative.   Nutrition: NPO Tolerating Diet: Donne Hazel PT: Ambulatory   DRUG ALLERGIES:   Allergies  Allergen Reactions  . Atorvastatin     Other reaction(s): Muscle Pain  . Levofloxacin Hives  . Morphine Nausea And Vomiting    Other reaction(s): Vomiting REACTION: Headache REACTION: Headache  . Penicillins Rash    Has patient had a PCN reaction causing immediate rash, facial/tongue/throat swelling, SOB or lightheadedness with hypotension: Yes Has patient had a PCN reaction causing severe rash involving mucus membranes or skin necrosis: No Has patient had a PCN reaction that required hospitalization: No Has patient had a PCN reaction occurring within the last 10 years: No. If all of the above answers are "NO", then may proceed with Cephalosporin use.  . Tramadol Hcl Nausea And Vomiting    Other reaction(s): Vomiting "feels weird"    VITALS:  Blood pressure  103/73, pulse (!) 103, temperature 97.8 F (36.6 C), temperature source Oral, resp. rate (!) 22, height 5\' 3"  (1.6 m), weight 80.4 kg, SpO2 96 %.  PHYSICAL EXAMINATION:   Physical Exam  GENERAL:  47 y.o.-year-old patient lying in bed in no acute distress.  EYES: Pupils equal, round, reactive to light and accommodation. No scleral icterus. Extraocular muscles intact.  HEENT: Head atraumatic, normocephalic. Oropharynx and nasopharynx clear.  NECK:  Supple, no jugular venous distention. No thyroid enlargement, no tenderness.  LUNGS: Normal breath sounds bilaterally, no wheezing, rales, rhonchi. No use of accessory muscles of respiration.  CARDIOVASCULAR: S1, S2 normal. No murmurs, rubs, or gallops.  ABDOMEN: Soft, Tender in Epigastric area, no rebound, rigidity, nondistended. Bowel sounds present. No organomegaly or mass.  EXTREMITIES: No cyanosis, clubbing or edema b/l.    NEUROLOGIC: Cranial nerves II through XII are intact. No focal Motor or sensory deficits b/l.   PSYCHIATRIC: The patient is alert and oriented x 3.  SKIN: No obvious rash, lesion, or ulcer.    LABORATORY PANEL:   CBC Recent Labs  Lab 10/15/17 0616  WBC 11.2*  HGB 13.3  HCT 38.7  PLT 321   ------------------------------------------------------------------------------------------------------------------  Chemistries  Recent Labs  Lab 10/14/17 1748  10/18/17 1456  NA 131*   < > 133*  K 4.2   < > 3.8  CL 98   < > 103  CO2 21*   < > 24  GLUCOSE 356*   < > 187*  BUN 9   < > 10  CREATININE 0.67   < > 0.75  CALCIUM 9.2   < > 8.1*  MG  --    < >  2.3  AST 53*  --   --   ALT 36  --   --   ALKPHOS 92  --   --   BILITOT 0.2*  --   --    < > = values in this interval not displayed.   ------------------------------------------------------------------------------------------------------------------  Cardiac Enzymes Recent Labs  Lab 10/14/17 1847  TROPONINI <0.03    ------------------------------------------------------------------------------------------------------------------  RADIOLOGY:  No results found.   ASSESSMENT AND PLAN:   47 year old female past medical history of hypertension, diabetes, irritable bowel syndrome, hypertriglyceridemia, who presents to the hospital due to abdominal pain nausea and vomiting and noted to have acute pancreatitis.  1.  Acute pancreatitis-secondary to severe hyperlipidemia/hypertriglyceridemia. - CT scan abdomen pelvis is not suggestive of any pancreatic necrosis.  Continue supportive care with aggressive IV fluid hydration, antiemetics, pain control. Pain is slowly getting better. Started on clear liquid diet now.  2.  Severe hypertriglyceridemia-patient's triglycerides are over 5000 with total cholesterol being over 1200.  Patient is supposed to be on TriCor but unclear if she is compliant with it. - Keep on IV insulin drip in stepdown level of care.  Once her abdominal pain nausea and vomiting improves we can restart her TriCor.  She is intolerant to statins. Triglycerides gradually improving with insulin drip.  3.  Hyponatremia- likely pseudohyponatremia secondary to some mild hyperglycemia along with dehydration. - We will continue IV fluids, follow sodium. 133 now.  4.  Diabetes type 2 without complication-patient's hemoglobin A1c is as high as 15.  She is only on oral meds, she will likely need to be on insulin prior to discharge.   on the insulin drip given her severe hyper triglyceridemia. -Appreciate diabetes cocordinator input.  5.  Diabetic neuropathy-continue gabapentin.  6.  Essential hypertension- patient cannot tolerate oral meds, continue PRN IV hydralazine.  7. Depression - cont. Effexor.     All the records are reviewed and case discussed with Care Management/Social Worker. Management plans discussed with the patient, family and they are in agreement.  CODE STATUS: Full code  DVT  Prophylaxis: Lovenox  TOTAL TIME TAKING CARE OF THIS PATIENT: 32 minutes.  Discussed with her husband and mother in the room.  POSSIBLE D/C IN 2-3 DAYS, DEPENDING ON CLINICAL CONDITION.   Vaughan Basta M.D on 10/18/2017 at 5:56 PM  Between 7am to 6pm - Pager - [phone]  After 6pm go to [email or web address] - Proofreader  [facility]  Office  [phone]  CC: Primary care physician; Leonel Ramsay, MD

## 2017-10-18 NOTE — Progress Notes (Addendum)
Pharmacy Electrolyte Monitoring Consult:  Pharmacy consulted to assist in monitoring and replacing electrolytes in this 47 y.o. female admitted on 10/14/2017 with pancreatitis. Patient transferred to the ICU for triglycerides over 5000 for management with an insulin drip.  Labs:  Sodium (mmol/L)  Date Value  10/18/2017 131 (L)  05/06/2017 135  03/18/2013 137   Potassium (mmol/L)  Date Value  10/18/2017 3.4 (L)  03/18/2013 3.9   Magnesium (mg/dL)  Date Value  10/18/2017 2.3  04/27/2013 1.8   Phosphorus (mg/dL)  Date Value  10/18/2017 2.8   Calcium (mg/dL)  Date Value  10/18/2017 8.2 (L)   Calcium, Total (mg/dL)  Date Value  03/18/2013 9.4   Albumin (g/dL)  Date Value  10/16/2017 2.8 (L)  05/06/2017 4.1  03/18/2013 3.9    Assessment/Plan: Electrolytes within range. No additional supplementation at this time.  Insulin drip currently infusing at 4 units/hr. D10 running at 100 mL/hr. NS at 32mL/hr.   Goal potassium ~4 and magnesium ~ 2.  Continue BMP frequency of Q6hr.   9/16 @ 2100:  K = 3.4 Will order KCl 10 mEq IV X 4 and recheck electrolytes on 9/17 with AM labs.   Pharmacy to continue to follow patient and replace electrolytes per consult.  Robbins,Jason D 10/18/2017 9:31 PM   9/16 add; KCl changed to PO per RN request.  Sim Boast, PharmD, BCPS  10/18/17 9:44 PM

## 2017-10-18 NOTE — Progress Notes (Signed)
RN asked Kelly Dyer, NP to assess patient's left abdomen because patient is complaining of feeling area that is more tender and firm.  NP acknowledged.

## 2017-10-18 NOTE — Progress Notes (Signed)
Pharmacy Electrolyte Monitoring Consult:  Pharmacy consulted to assist in monitoring and replacing electrolytes in this 47 y.o. female admitted on 10/14/2017 with pancreatitis. Patient transferred to the ICU for triglycerides over 5000 for management with an insulin drip.  Labs:  Sodium (mmol/L)  Date Value  10/18/2017 130 (L)  05/06/2017 135  03/18/2013 137   Potassium (mmol/L)  Date Value  10/18/2017 4.1  03/18/2013 3.9   Magnesium (mg/dL)  Date Value  10/17/2017 2.0  04/27/2013 1.8   Phosphorus (mg/dL)  Date Value  10/17/2017 2.5   Calcium (mg/dL)  Date Value  10/18/2017 7.8 (L)   Calcium, Total (mg/dL)  Date Value  03/18/2013 9.4   Albumin (g/dL)  Date Value  10/16/2017 2.8 (L)  05/06/2017 4.1  03/18/2013 3.9    Assessment/Plan: 09/16 @ 0500 K WNL, Na 130. NS still @ 50 ml/hr w/ D10 @ 100 ml/hr (CBG 174). Will continue to monitor.  Tobie Lords, PharmD, BCPS Clinical Pharmacist 10/18/2017

## 2017-10-18 NOTE — Care Management Note (Signed)
Case Management Note  Patient Details  Name: Kelly Rollins MRN: [id number] Date of Birth: [birth year]  Subjective/Objective:                 Patient admitted with acute pancreatitis with triglycerides of greater than 5,000 requiring continuous insulin infusion.  Infusion will be discontinued when triglycerides are 500.  Patient has chronic back pain.  Lost insurance I April 2019.  She has been denied disability twice and currently in appeal with legal assistance.  She is seen at [facility] and [facility] for meds.  Says her husband has application to [facility].  Discussed  completing and CM will fax.  Patient has concern of the cost of insulin if discharge home on insulin   Action/Plan:  CM will assess for discharge medications and affordability  Expected Discharge Date:                  Expected Discharge Plan:     In-House Referral:     Discharge planning Services     Post Acute Care Choice:    Choice offered to:     DME Arranged:    DME Agency:     HH Arranged:    HH Agency:     Status of Service:     If discussed at [employer], dates discussed:    Additional Comments:  Katrina Stack, RN 10/18/2017, 12:40 PM

## 2017-10-18 NOTE — Progress Notes (Signed)
Patient admitted with acute pancreatitis with triglycerides of greater than 5,000 requiring continuous insulin infusion.    Infusion will be discontinued when triglycerides are 500.  Patient has chronic back pain.    TG >5000--->1321 on insulin infusion     Corrin Parker, M.D.  [facility]  Medical Director [place] Director [facility] Cardio-Pulmonary Department

## 2017-10-19 ENCOUNTER — Inpatient Hospital Stay: Payer: Medicaid Other

## 2017-10-19 LAB — BASIC METABOLIC PANEL
ANION GAP: 8 (ref 5–15)
Anion gap: 6 (ref 5–15)
BUN: 9 mg/dL (ref 6–20)
BUN: 7 mg/dL (ref 6–20)
CHLORIDE: 102 mmol/L (ref 98–111)
CHLORIDE: 105 mmol/L (ref 98–111)
CO2: 24 mmol/L (ref 22–32)
CO2: 24 mmol/L (ref 22–32)
CREATININE: 0.78 mg/dL (ref 0.44–1.00)
Calcium: 8.5 mg/dL — ABNORMAL LOW (ref 8.9–10.3)
Calcium: 8.5 mg/dL — ABNORMAL LOW (ref 8.9–10.3)
Creatinine, Ser: 0.73 mg/dL (ref 0.44–1.00)
GFR calc Af Amer: >60 mL/min (ref >60)
GFR calc non Af Amer: >60 mL/min (ref >60)
GFR calc non Af Amer: >60 mL/min (ref >60)
Glucose, Bld: 272 mg/dL — ABNORMAL HIGH (ref 70–99)
Glucose, Bld: 106 mg/dL — ABNORMAL HIGH (ref 70–99)
POTASSIUM: 4.2 mmol/L (ref 3.5–5.1)
Potassium: 3.7 mmol/L (ref 3.5–5.1)
SODIUM: 135 mmol/L (ref 135–145)
Sodium: 134 mmol/L — ABNORMAL LOW (ref 135–145)

## 2017-10-19 LAB — GLUCOSE, CAPILLARY
GLUCOSE-CAPILLARY: 107 mg/dL — AB (ref 70–99)
GLUCOSE-CAPILLARY: 247 mg/dL — AB (ref 70–99)
Glucose-Capillary: 154 mg/dL — ABNORMAL HIGH (ref 70–99)
Glucose-Capillary: 209 mg/dL — ABNORMAL HIGH (ref 70–99)
Glucose-Capillary: 230 mg/dL — ABNORMAL HIGH (ref 70–99)
Glucose-Capillary: 236 mg/dL — ABNORMAL HIGH (ref 70–99)
Glucose-Capillary: 240 mg/dL — ABNORMAL HIGH (ref 70–99)
Glucose-Capillary: 242 mg/dL — ABNORMAL HIGH (ref 70–99)
Glucose-Capillary: 245 mg/dL — ABNORMAL HIGH (ref 70–99)
Glucose-Capillary: 250 mg/dL — ABNORMAL HIGH (ref 70–99)
Glucose-Capillary: 282 mg/dL — ABNORMAL HIGH (ref 70–99)

## 2017-10-19 LAB — MAGNESIUM: Magnesium: 2.2 mg/dL (ref 1.7–2.4)

## 2017-10-19 LAB — TRIGLYCERIDES: Triglycerides: 988 mg/dL — ABNORMAL HIGH (ref <150)

## 2017-10-19 MED ORDER — HYDROMORPHONE HCL 1 MG/ML IJ SOLN
0.5000 mg | INTRAMUSCULAR | Status: DC | PRN
  Administered 2017-10-19 – 2017-10-21 (×17): 0.5 mg via INTRAVENOUS
  Filled 2017-10-19 (×18): qty 1

## 2017-10-19 MED ORDER — POTASSIUM CHLORIDE CRYS ER 20 MEQ PO TBCR
40.0000 meq | EXTENDED_RELEASE_TABLET | Freq: Once | ORAL | Status: AC
  Administered 2017-10-19: 40 meq via ORAL
  Filled 2017-10-19: qty 2

## 2017-10-19 NOTE — Progress Notes (Signed)
During rounds insulin drip and D10 was discussed and Dr. Mortimer Fries gave order to change insulin drip rate to 4 units an hour and to not titrate and discussed with pharmacy to titrate d10 infusion based off of blood glucose.

## 2017-10-19 NOTE — Progress Notes (Signed)
RN called and spoke with Kelly Rollins, [facility] and asked about insulin drip.  Patient's 1800 blood glucose is 154 and per titration instructions for insulin drip, drip should be turned off since it is running at 2 units/H and order is to decrease drip by 2.  Kelly Rollins [facility] stated she would look into what to do about insulin drip since it is ordered for pancreatitis.

## 2017-10-19 NOTE — Progress Notes (Signed)
Pharmacy Electrolyte Monitoring Consult:  Pharmacy consulted to assist in monitoring and replacing electrolytes in this 47 y.o. female admitted on 10/14/2017 with pancreatitis. Patient transferred to the ICU for triglycerides over 5000 for management with an insulin drip.  Labs:  Sodium (mmol/L)  Date Value  10/19/2017 134 (L)  05/06/2017 135  03/18/2013 137   Potassium (mmol/L)  Date Value  10/19/2017 4.2  03/18/2013 3.9   Magnesium (mg/dL)  Date Value  10/19/2017 2.2  04/27/2013 1.8   Phosphorus (mg/dL)  Date Value  10/18/2017 2.8   Calcium (mg/dL)  Date Value  10/19/2017 8.5 (L)   Calcium, Total (mg/dL)  Date Value  03/18/2013 9.4   Albumin (g/dL)  Date Value  10/16/2017 2.8 (L)  05/06/2017 4.1  03/18/2013 3.9    Assessment/Plan: Electrolytes within range. No additional supplementation at this time.  Insulin drip currently infusing at 5 units/hr. D10 running at 100 mL/hr. NS at 28mL/hr.   Goal potassium ~4 and magnesium ~ 2.  Will monitor BMPs Q12hr.   Pharmacy to continue to follow patient and replace electrolytes per consult.  MLS 10/19/2017 4:05 PM

## 2017-10-19 NOTE — Progress Notes (Signed)
Patient admitted with acute pancreatitis with triglycerides of greater than 5,000 requiring continuous insulin infusion.    Infusion will be discontinued when triglycerides are 500.  Patient has chronic back pain.    TG >5000--->132-->900's  on insulin infusion  She needs fixed insulin drip rate, adjust d10 if needed     Corrin Parker, M.D.  [facility]  Medical Director [place] Director [employer] Department

## 2017-10-19 NOTE — Progress Notes (Signed)
Pt remains on D10 and insulin gtt overnight.

## 2017-10-19 NOTE — Progress Notes (Signed)
Pharmacy Electrolyte Monitoring Consult:  Pharmacy consulted to assist in monitoring and replacing electrolytes in this 47 y.o. female admitted on 10/14/2017 with pancreatitis. Patient transferred to the ICU for triglycerides over 5000 for management with an insulin drip.  Labs:  Sodium (mmol/L)  Date Value  10/19/2017 135  05/06/2017 135  03/18/2013 137   Potassium (mmol/L)  Date Value  10/19/2017 3.7  03/18/2013 3.9   Magnesium (mg/dL)  Date Value  10/19/2017 2.2  04/27/2013 1.8   Phosphorus (mg/dL)  Date Value  10/18/2017 2.8   Calcium (mg/dL)  Date Value  10/19/2017 8.5 (L)   Calcium, Total (mg/dL)  Date Value  03/18/2013 9.4   Albumin (g/dL)  Date Value  10/16/2017 2.8 (L)  05/06/2017 4.1  03/18/2013 3.9    Assessment/Plan: Potassium 76mEq Po x 1.   Insulin drip currently infusing at 2 units/hr. Will increase D10 to 150 mL/hr. Will hold NS to maintain fluid rate.   Goal potassium ~4 and magnesium ~ 2.  Will monitor BMPs Q12hr.   Pharmacy to continue to follow patient and replace electrolytes per consult.  MLS 10/19/2017 6:40 PM

## 2017-10-19 NOTE — Progress Notes (Addendum)
Inpatient Diabetes Program Recommendations  AACE/ADA: New Consensus Statement on Inpatient Glycemic Control (2019)  Target Ranges:  Prepandial:   less than 140 mg/dL      Peak postprandial:   less than 180 mg/dL (1-2 hours)      Critically ill patients:  140 - 180 mg/dL  Results for Kelly Rollins, Kelly Rollins (MRN [id number]) as of 10/19/2017 07:27  Ref. Range 10/19/2017 00:04 10/19/2017 02:09 10/19/2017 03:54 10/19/2017 06:05  Glucose-Capillary Latest Ref Range: 70 - 99 mg/dL 240 (H) 245 (H) 242 (H) 236 (H)   Results for Kelly Rollins, Kelly Rollins (MRN [id number]) as of 10/19/2017 07:27  Ref. Range 10/18/2017 04:09 10/18/2017 11:52 10/18/2017 14:33 10/18/2017 16:22 10/18/2017 18:30 10/18/2017 20:12 10/18/2017 22:16  Glucose-Capillary Latest Ref Range: 70 - 99 mg/dL 165 (H) 131 (H) 169 (H) 165 (H) 185 (H) 173 (H) 240 (H)  Results for Kelly Rollins, Kelly Rollins (MRN [id number]) as of 10/19/2017 07:27  Ref. Range 10/19/2017 04:29  Triglycerides Latest Ref Range: <150 mg/dL 988 (H)   Results for Kelly Rollins, Kelly Rollins (MRN [id number]) as of 10/19/2017 07:27  Ref. Range 10/14/2017 17:48  Hemoglobin A1C Latest Ref Range: 4.8 - 5.6 % 14.5 (H)   Review of Glycemic Control  Diabetes history: DM2 Outpatient Diabetes medications: Metformin 500 mg BID Current orders for Inpatient glycemic control: IV insulin drip for hypertriglyceridemia    Inpatient Diabetes Program Recommendations:   Insulin - IV drip/GlucoStabilizer: MD notes insulin infusion to continue until triglycerides are less than 500.  IV Fluids: Once MD is ready to transition off IV insulin to SQ insulin, please discontinue D10 from IV fluids.  Insulin - Basal: Once MD is ready to transition off IV insulin to SQ insulin, please consider ordering Levemir 28 units Q24H (based on 80.4 kg x 0.35 units).  Correction (SSI): Once MD is ready to transition off IV insulin to SQ insulin, please consider ordering CBGs with Novolog 0-15 units Q4H.  Insulin - Meal Coverage: Once MD  is ready to transition off IV insulin to SQ insulin and diet is advanced to solid food, please consider ordering Novolog 3 units TID with meals if patient eats at least 50% of meals.  HgbA1C: A1C 14.5% on 10/14/17. Anticipate patient will require insulin for DM control as an outpatient.   Outpatient Referral: Recommend patient establish care with an Endocrinologist for assistance with DM management.  NOTE: Please note that if Endocrinology is consulted, they no longer come into the hospital for consults. Dr. Gabriel Carina has agreed in the past to review patient charts and provide recommendations.  If attending MD would like Endocrinology input, attending MD will need to call Dr. Gabriel Carina directly to discuss.  Addendum 10/19/17@11 :00-Went by to follow up with patient and discuss insulin. Patient reports that she is feeling worse today and pain is not controlled and is worse than yesterday. Patient states nurse and MD aware of pain. Discussed insulin drip still infusing and MD notes to continue until triglycerides are less than 500. Informed patient that Diabetes Coordinator will follow up again tomorrow and see if she is feeling better. If so, will discuss insulin and SQ injections in more detail. Patient verbalized understanding of information and states that she has no questions at this time.  Thanks, Barnie Alderman, RN, MSN, CDE Diabetes Coordinator Inpatient Diabetes Program [phone] (Team Pager from 8am to 5pm)

## 2017-10-19 NOTE — Progress Notes (Signed)
RN spoke with Micheal, [place] and discussed insulin drip and d10 and Micheal stated that patient's insulin drip should be going at 5 units an hour and to follow the scale as ordered and that he discussed this with Dr. Mortimer Fries.

## 2017-10-19 NOTE — Progress Notes (Signed)
Altha Harm, [facility] called and instructed RN to decrease insulin drip down to 1 unit/H and to recheck blood sugar one hour after making the change and to change IV dextrose per order.  Then titrate insulin drip based off titration instructions.

## 2017-10-20 ENCOUNTER — Inpatient Hospital Stay: Payer: Medicaid Other

## 2017-10-20 LAB — MAGNESIUM: MAGNESIUM: 1.9 mg/dL (ref 1.7–2.4)

## 2017-10-20 LAB — GLUCOSE, CAPILLARY
GLUCOSE-CAPILLARY: 250 mg/dL — AB (ref 70–99)
GLUCOSE-CAPILLARY: 284 mg/dL — AB (ref 70–99)
GLUCOSE-CAPILLARY: 249 mg/dL — AB (ref 70–99)
Glucose-Capillary: 246 mg/dL — ABNORMAL HIGH (ref 70–99)
Glucose-Capillary: 259 mg/dL — ABNORMAL HIGH (ref 70–99)
Glucose-Capillary: 172 mg/dL — ABNORMAL HIGH (ref 70–99)
Glucose-Capillary: 186 mg/dL — ABNORMAL HIGH (ref 70–99)

## 2017-10-20 LAB — BASIC METABOLIC PANEL
Anion gap: 7 (ref 5–15)
BUN: 6 mg/dL (ref 6–20)
CO2: 25 mmol/L (ref 22–32)
Calcium: 8.6 mg/dL — ABNORMAL LOW (ref 8.9–10.3)
Chloride: 102 mmol/L (ref 98–111)
Creatinine, Ser: 0.69 mg/dL (ref 0.44–1.00)
GFR calc Af Amer: >60 mL/min (ref >60)
GFR calc non Af Amer: >60 mL/min (ref >60)
GLUCOSE: 309 mg/dL — AB (ref 70–99)
Potassium: 4.3 mmol/L (ref 3.5–5.1)
Sodium: 134 mmol/L — ABNORMAL LOW (ref 135–145)

## 2017-10-20 LAB — PHOSPHORUS: PHOSPHORUS: 4.4 mg/dL (ref 2.5–4.6)

## 2017-10-20 LAB — TRIGLYCERIDES: Triglycerides: 734 mg/dL — ABNORMAL HIGH (ref <150)

## 2017-10-20 MED ORDER — OXYCODONE HCL ER 10 MG PO T12A
10.0000 mg | EXTENDED_RELEASE_TABLET | Freq: Two times a day (BID) | ORAL | Status: DC
  Administered 2017-10-20 – 2017-10-21 (×4): 10 mg via ORAL
  Filled 2017-10-20 (×4): qty 1

## 2017-10-20 MED ORDER — INSULIN ASPART 100 UNIT/ML ~~LOC~~ SOLN
0.0000 [IU] | Freq: Three times a day (TID) | SUBCUTANEOUS | Status: DC
  Administered 2017-10-20: 5 [IU] via SUBCUTANEOUS
  Administered 2017-10-20 – 2017-10-21 (×4): 3 [IU] via SUBCUTANEOUS
  Administered 2017-10-22: 2 [IU] via SUBCUTANEOUS
  Administered 2017-10-22 – 2017-10-23 (×3): 3 [IU] via SUBCUTANEOUS
  Administered 2017-10-23: 5 [IU] via SUBCUTANEOUS
  Administered 2017-10-24 – 2017-10-25 (×2): 3 [IU] via SUBCUTANEOUS
  Administered 2017-10-26 – 2017-10-27 (×2): 2 [IU] via SUBCUTANEOUS
  Filled 2017-10-20 (×14): qty 1

## 2017-10-20 MED ORDER — INSULIN DETEMIR 100 UNIT/ML ~~LOC~~ SOLN
15.0000 [IU] | Freq: Every day | SUBCUTANEOUS | Status: DC
  Administered 2017-10-20 – 2017-10-23 (×4): 15 [IU] via SUBCUTANEOUS
  Filled 2017-10-20 (×6): qty 0.15

## 2017-10-20 MED ORDER — KETOROLAC TROMETHAMINE 30 MG/ML IJ SOLN
30.0000 mg | Freq: Four times a day (QID) | INTRAMUSCULAR | Status: DC | PRN
  Administered 2017-10-20 – 2017-10-24 (×9): 30 mg via INTRAVENOUS
  Filled 2017-10-20 (×9): qty 1

## 2017-10-20 MED ORDER — METOPROLOL SUCCINATE ER 50 MG PO TB24
50.0000 mg | ORAL_TABLET | Freq: Every day | ORAL | Status: DC
  Administered 2017-10-20 – 2017-10-27 (×8): 50 mg via ORAL
  Filled 2017-10-20 (×8): qty 1

## 2017-10-20 MED ORDER — INSULIN ASPART 100 UNIT/ML ~~LOC~~ SOLN
0.0000 [IU] | Freq: Every day | SUBCUTANEOUS | Status: DC
  Filled 2017-10-20: qty 1

## 2017-10-20 NOTE — Progress Notes (Signed)
[place] [facility] [place] NAME: Kelly Rollins    MR#:  [id number]  DATE OF BIRTH:  1970-08-06  SUBJECTIVE:   Patient presents to the hospital due to significant abdominal pain nausea and vomiting and noted to have acute pancreatitis.  Patient also noted to be severely hyperlipidemic with severe hypertriglyceridemia. She confirms less pain and nausea now.  Triglyceride level coming down now with insulin drip. Still keep asking for pain meds.  Insuline stopped. Sent to floor. Repeat CT still shows pancreatitis.  REVIEW OF SYSTEMS:    Review of Systems  Constitutional: Negative for chills and fever.  HENT: Negative for congestion and tinnitus.   Eyes: Negative for blurred vision and double vision.  Respiratory: Negative for cough, shortness of breath and wheezing.   Cardiovascular: Negative for chest pain, orthopnea and PND.  Gastrointestinal: Positive for abdominal pain, nausea and vomiting. Negative for diarrhea.  Genitourinary: Negative for dysuria and hematuria.  Neurological: Negative for dizziness, sensory change and focal weakness.  All other systems reviewed and are negative.   Nutrition: NPO Tolerating Diet: Donne Hazel PT: Ambulatory   DRUG ALLERGIES:   Allergies  Allergen Reactions  . Atorvastatin     Other reaction(s): Muscle Pain  . Levofloxacin Hives  . Morphine Nausea And Vomiting    Other reaction(s): Vomiting REACTION: Headache REACTION: Headache  . Penicillins Rash    Has patient had a PCN reaction causing immediate rash, facial/tongue/throat swelling, SOB or lightheadedness with hypotension: Yes Has patient had a PCN reaction causing severe rash involving mucus membranes or skin necrosis: No Has patient had a PCN reaction that required hospitalization: No Has patient had a PCN reaction occurring within the last 10 years: No. If all of the above answers are "NO", then may proceed with Cephalosporin use.  . Tramadol  Hcl Nausea And Vomiting    Other reaction(s): Vomiting "feels weird"    VITALS:  Blood pressure (!) 153/82, pulse (!) 101, temperature 98.4 F (36.9 C), temperature source Oral, resp. rate 18, height 5\' 3"  (1.6 m), weight 80.4 kg, SpO2 95 %.  PHYSICAL EXAMINATION:   Physical Exam  GENERAL:  47 y.o.-year-old patient lying in bed in no acute distress.  EYES: Pupils equal, round, reactive to light and accommodation. No scleral icterus. Extraocular muscles intact.  HEENT: Head atraumatic, normocephalic. Oropharynx and nasopharynx clear.  NECK:  Supple, no jugular venous distention. No thyroid enlargement, no tenderness.  LUNGS: Normal breath sounds bilaterally, no wheezing, rales, rhonchi. No use of accessory muscles of respiration.  CARDIOVASCULAR: S1, S2 normal. No murmurs, rubs, or gallops.  ABDOMEN: Soft, Tender in Epigastric area, no rebound, rigidity, nondistended. Bowel sounds present. No organomegaly or mass.  EXTREMITIES: No cyanosis, clubbing or edema b/l.    NEUROLOGIC: Cranial nerves II through XII are intact. No focal Motor or sensory deficits b/l.   PSYCHIATRIC: The patient is alert and oriented x 3.  SKIN: No obvious rash, lesion, or ulcer.    LABORATORY PANEL:   CBC Recent Labs  Lab 10/15/17 0616  WBC 11.2*  HGB 13.3  HCT 38.7  PLT 321   ------------------------------------------------------------------------------------------------------------------  Chemistries  Recent Labs  Lab 10/14/17 1748  10/20/17 0512  NA 131*   < > 134*  K 4.2   < > 4.3  CL 98   < > 102  CO2 21*   < > 25  GLUCOSE 356*   < > 309*  BUN 9   < > 6  CREATININE 0.67   < > 0.69  CALCIUM 9.2   < > 8.6*  MG  --    < > 1.9  AST 53*  --   --   ALT 36  --   --   ALKPHOS 92  --   --   BILITOT 0.2*  --   --    < > = values in this interval not displayed.   ------------------------------------------------------------------------------------------------------------------  Cardiac  Enzymes Recent Labs  Lab 10/14/17 1847  TROPONINI <0.03   ------------------------------------------------------------------------------------------------------------------  RADIOLOGY:  Ct Abdomen Pelvis Wo Contrast  Result Date: 10/20/2017 CLINICAL DATA:  Abdominal pain, distension, nausea.  Pancreatitis. EXAM: CT ABDOMEN AND PELVIS WITHOUT CONTRAST TECHNIQUE: Multidetector CT imaging of the abdomen and pelvis was performed following the standard protocol without IV contrast. COMPARISON:  10/14/2017 FINDINGS: Lower chest: Linear scarring/atelectasis in the right lower lobe. Hepatobiliary: Mild hepatic steatosis. Status post cholecystectomy. No intrahepatic or extrahepatic ductal dilatation. Pancreas: Peripancreatic fluid/inflammatory changes, predominantly along the pancreatic head/uncinate process. No drainable fluid collection/pseudocyst. Spleen: Within normal limits. Adrenals/Urinary Tract: Adrenal glands within normal limits. Kidneys are within normal limits. Mild perinephric fluid along the right kidney. No renal calculi or hydronephrosis. Bladder is within normal limits. Stomach/Bowel: Stomach is within normal limits. Fluid/edema along the 2nd/3rd portion of the duodenum, likely related to the patient's pancreatitis. No convincing duodenal wall thickening. No evidence bowel obstruction. Normal appendix (series 2/image 63). Vascular/Lymphatic: No evidence of abdominal aortic aneurysm. No suspicious abdominopelvic lymphadenopathy. Reproductive: Status post hysterectomy. Bilateral ovaries are within normal limits. Other: Trace ascites along the right pericolic gutter. Trace right pelvic ascites. Musculoskeletal: Mild degenerative changes of the lower thoracic spine. IMPRESSION: Acute pancreatitis, without drainable fluid collection/pseudocyst. Secondary fluid/ascites along the duodenum, right kidney, right pericolic gutter, and right pelvis. This has mildly progressed. Electronically Signed   By:  Julian Hy M.D.   On: 10/20/2017 10:53   Dg Abd 1 View  Result Date: 10/19/2017 CLINICAL DATA:  Abdominal pain EXAM: ABDOMEN - 1 VIEW COMPARISON:  CT abdomen pelvis of 10/14/2017 and SI joint films of 4 levin 2018 FINDINGS: Supine film of the abdomen shows a nonspecific bowel gas pattern. There is colonic bowel gas present with only small amount of small bowel gas. May be mild ileus with slightly prominent small bowel loop adjacent to the region of the pancreas possibly due to the previously demonstrated pancreatitis. No opaque calculi are seen. The bones are unremarkable. IMPRESSION: 1. No bowel obstruction. 2. Possible mild ileus within the upper epigastrium most likely related to the inflammation of pancreatitis. Electronically Signed   By: Ivar Drape M.D.   On: 10/19/2017 09:11     ASSESSMENT AND PLAN:   47 year old female past medical history of hypertension, diabetes, irritable bowel syndrome, hypertriglyceridemia, who presents to the hospital due to abdominal pain nausea and vomiting and noted to have acute pancreatitis.  1.  Acute pancreatitis-secondary to severe hyperlipidemia/hypertriglyceridemia. - CT scan abdomen pelvis is not suggestive of any pancreatic necrosis.  Continue supportive care with aggressive IV fluid hydration, antiemetics, pain control. Pain is slowly getting better. Started on liquid diet now.  Still have pancreatitis on CT, cont liquid diet, check Lipase.  Added long acting oxycodone for better pain control.  2.  Severe hypertriglyceridemia-patient's triglycerides are over 5000 with total cholesterol being over 1200.  Patient is supposed to be on TriCor but unclear if she is compliant with it. - Keep on IV insulin drip in stepdown level of care.  Once her  abdominal pain nausea and vomiting improves we can restart her TriCor.  She is intolerant to statins. Triglycerides gradually improving with insulin drip.  Counseled today again in presence of mother and  daughter about diet control.  Stopped insuline drip, will start on basal insuline now.  3.  Hyponatremia- likely pseudohyponatremia secondary to some mild hyperglycemia along with dehydration. - We will continue IV fluids, follow sodium. 133- 135 now.  4.  Diabetes type 2 without complication-patient's hemoglobin A1c is as high as 15.  She is only on oral meds, she will likely need to be on insulin prior to discharge.   on the insulin drip given her severe hyper triglyceridemia. -Appreciate diabetes cocordinator input.  5.  Diabetic neuropathy-continue gabapentin.  6.  Essential hypertension- patient cannot tolerate oral meds, continue PRN IV hydralazine.  7. Depression - cont. Effexor.    All the records are reviewed and case discussed with Care Management/Social Worker. Management plans discussed with the patient, family and they are in agreement.  CODE STATUS: Full code  DVT Prophylaxis: Lovenox  TOTAL TIME TAKING CARE OF THIS PATIENT: 32 minutes.  Discussed with her daughter and mother in the room.  POSSIBLE D/C IN 1-2 DAYS, DEPENDING ON CLINICAL CONDITION.   Vaughan Basta M.D on 10/20/2017 at 2:34 PM  Between 7am to 6pm - Pager - [phone]  After 6pm go to [email or web address] - Proofreader  [facility]  Office  [phone]  CC: Primary care physician; Leonel Ramsay, MD

## 2017-10-20 NOTE — Progress Notes (Signed)
Patient off unit by bed on tele with Pam, orderly going to CT.

## 2017-10-20 NOTE — Progress Notes (Signed)
Pharmacy Electrolyte Monitoring Consult:  Pharmacy consulted to assist in monitoring and replacing electrolytes in this 47 y.o. female admitted on 10/14/2017 with pancreatitis. Patient transferred to the ICU for triglycerides over 5000 for management with an insulin drip.  Labs:  Sodium (mmol/L)  Date Value  10/20/2017 134 (L)  05/06/2017 135  03/18/2013 137   Potassium (mmol/L)  Date Value  10/20/2017 4.3  03/18/2013 3.9   Magnesium (mg/dL)  Date Value  10/20/2017 1.9  04/27/2013 1.8   Phosphorus (mg/dL)  Date Value  10/20/2017 4.4   Calcium (mg/dL)  Date Value  10/20/2017 8.6 (L)   Calcium, Total (mg/dL)  Date Value  03/18/2013 9.4   Albumin (g/dL)  Date Value  10/16/2017 2.8 (L)  05/06/2017 4.1  03/18/2013 3.9    Assessment/Plan: Off Insulin drip now. Electrolytes WNL. No supplementation needed.  Goal potassium ~4 and magnesium ~ 2.  Will monitor BMP.   Pharmacy to continue to follow patient and replace electrolytes per consult.  Paulina Fusi, PharmD, BCPS 10/20/2017 11:55 AM

## 2017-10-20 NOTE — Progress Notes (Signed)
Patient moved to room 119 by wheelchair with Donovan, NT.  Patient alert with no distress noted when leaving ICU.

## 2017-10-20 NOTE — Care Management (Signed)
D10 and insulin drip to be discontinued today and patient to move out of ICU. Patient's husband has not completed [facility] and [facility].applications so CM can not fax.

## 2017-10-20 NOTE — Progress Notes (Signed)
Inpatient Diabetes Program Recommendations  AACE/ADA: New Consensus Statement on Inpatient Glycemic Control (2019)  Target Ranges:  Prepandial:   less than 140 mg/dL      Peak postprandial:   less than 180 mg/dL (1-2 hours)      Critically ill patients:  140 - 180 mg/dL   Results for Kelly Rollins, Kelly Rollins (MRN [id number]) as of 10/20/2017 14:28  Ref. Range 10/19/2017 12:12 10/19/2017 16:28 10/19/2017 18:04 10/19/2017 19:59 10/19/2017 21:56 10/19/2017 23:54 10/20/2017 01:59 10/20/2017 04:05 10/20/2017 06:05 10/20/2017 07:35 10/20/2017 11:30  Glucose-Capillary Latest Ref Range: 70 - 99 mg/dL 250 (H) 107 (H) 154 (H) 209 (H) 230 (H) 247 (H) 250 (H) 246 (H) 259 (H) 284 (H) 249 (H)   Review of Glycemic Control  Diabetes history: DM2 Outpatient Diabetes medications: Metformin 500 mg BID Current orders for Inpatient glycemic control:Novolog 0-15 units TID with meals, Novolog 0-5 units QHS  Inpatient Diabetes Program Recommendations:  Insulin - Basal: Please consider ordering Levemir 16 units Q24H (based on 80.4 kg x 0.2 units) Correction (SSI): Please consider changing frequency of CBGs and Novolog 0-15 units to Q4H to allow closer monitoring and correction if needed. HgbA1C: A1C 14.5% on 10/14/17 indicating an average glucose of 369 mg/dl. Anticipate patient will require insulin for DM control as an outpatient.   NOTE: Noted IV insulin and D10 was stopped around 10:15 am today. Glucose was 249 mg/dl at 11:30 and patient received Novolog 5 units SQ at 11:43.  Spoke with patient regarding DM and insulin. Patient was very drowsy and noted to have scattered thoughts and hard to keep focused. During prior visits with patient, she has been alert, oriented, focused, and very engaged in discussion.  Had patient sit up in bed and she became a more alert during our conversation. Reviewed some basic information on DM that has been discussed during prior visits. Discussed Levemir and Novolog insulin (which are recommended at  this time) and how each insulin works to keep DM controlled. Explained that if she is discharged on basal and bolus insulin, she will be prescribed Levemir and Humalog (vials) since those are the insulins that [facility]) have available. Explained and demonstrated proper technique on how to draw up insulin with vial and syringe. Showed patient how to draw up air, inject air into vial, draw up insulin, and inject into practice dome. Patient demonstrated competency with insulin administration by vial and syringe. RN to continue working with patient on insulin injections before discharge and allow patient to administer own insulin injections while inpatient. Encouraged patient to self-administer insulin while inpatient to ensure proper technique. Patient inquired about what insulin and dose she will be taking as an outpatient.  Informed patient that if discharged on insulin, discharge instructions will be written out with specific instruction on insulin and dosages. Reviewed hyperglycemia and hypoglycemia in detail along with proper treatment for both. Encouraged patient to fill out application for [facility] and [facility] and she reports that her and her husband will work on it this evening when he comes by after work.  Patient verbalized understanding of information and states she has no further questions at this time. Spoke with Andee Poles, RN regarding patient discussion and asked that patient be allowed to self administer insulin injections. Will follow up with patient again tomorrow.  Thanks, Barnie Alderman, RN, MSN, CDE Diabetes Coordinator Inpatient Diabetes Program [phone] (Team Pager from 8am to 5pm)

## 2017-10-20 NOTE — Progress Notes (Signed)
Dr. Mortimer Fries present and gave order for patient to travel off unit without RN to CT.

## 2017-10-20 NOTE — Progress Notes (Signed)
[place] at [place] NAME: Hiba Garry    MR#:  [id number]  DATE OF BIRTH:  Apr 19, 1970  SUBJECTIVE:   Patient presents to the hospital due to significant abdominal pain nausea and vomiting and noted to have acute pancreatitis.  Patient also noted to be severely hyperlipidemic with severe hypertriglyceridemia. She confirms less pain and nausea now.  Triglyceride level coming down now with insulin drip. Still keep asking for pain meds.  REVIEW OF SYSTEMS:    Review of Systems  Constitutional: Negative for chills and fever.  HENT: Negative for congestion and tinnitus.   Eyes: Negative for blurred vision and double vision.  Respiratory: Negative for cough, shortness of breath and wheezing.   Cardiovascular: Negative for chest pain, orthopnea and PND.  Gastrointestinal: Positive for abdominal pain, nausea and vomiting. Negative for diarrhea.  Genitourinary: Negative for dysuria and hematuria.  Neurological: Negative for dizziness, sensory change and focal weakness.  All other systems reviewed and are negative.   Nutrition: NPO Tolerating Diet: Donne Hazel PT: Ambulatory   DRUG ALLERGIES:   Allergies  Allergen Reactions  . Atorvastatin     Other reaction(s): Muscle Pain  . Levofloxacin Hives  . Morphine Nausea And Vomiting    Other reaction(s): Vomiting REACTION: Headache REACTION: Headache  . Penicillins Rash    Has patient had a PCN reaction causing immediate rash, facial/tongue/throat swelling, SOB or lightheadedness with hypotension: Yes Has patient had a PCN reaction causing severe rash involving mucus membranes or skin necrosis: No Has patient had a PCN reaction that required hospitalization: No Has patient had a PCN reaction occurring within the last 10 years: No. If all of the above answers are "NO", then may proceed with Cephalosporin use.  . Tramadol Hcl Nausea And Vomiting    Other reaction(s): Vomiting "feels weird"     VITALS:  Blood pressure 131/80, pulse 89, temperature 98.1 F (36.7 C), temperature source Oral, resp. rate 11, height 5\' 3"  (1.6 m), weight 80.4 kg, SpO2 98 %.  PHYSICAL EXAMINATION:   Physical Exam  GENERAL:  47 y.o.-year-old patient lying in bed in no acute distress.  EYES: Pupils equal, round, reactive to light and accommodation. No scleral icterus. Extraocular muscles intact.  HEENT: Head atraumatic, normocephalic. Oropharynx and nasopharynx clear.  NECK:  Supple, no jugular venous distention. No thyroid enlargement, no tenderness.  LUNGS: Normal breath sounds bilaterally, no wheezing, rales, rhonchi. No use of accessory muscles of respiration.  CARDIOVASCULAR: S1, S2 normal. No murmurs, rubs, or gallops.  ABDOMEN: Soft, Tender in Epigastric area, no rebound, rigidity, nondistended. Bowel sounds present. No organomegaly or mass.  EXTREMITIES: No cyanosis, clubbing or edema b/l.    NEUROLOGIC: Cranial nerves II through XII are intact. No focal Motor or sensory deficits b/l.   PSYCHIATRIC: The patient is alert and oriented x 3.  SKIN: No obvious rash, lesion, or ulcer.    LABORATORY PANEL:   CBC Recent Labs  Lab 10/15/17 0616  WBC 11.2*  HGB 13.3  HCT 38.7  PLT 321   ------------------------------------------------------------------------------------------------------------------  Chemistries  Recent Labs  Lab 10/14/17 1748  10/20/17 0512  NA 131*   < > 134*  K 4.2   < > 4.3  CL 98   < > 102  CO2 21*   < > 25  GLUCOSE 356*   < > 309*  BUN 9   < > 6  CREATININE 0.67   < > 0.69  CALCIUM 9.2   < >  8.6*  MG  --    < > 1.9  AST 53*  --   --   ALT 36  --   --   ALKPHOS 92  --   --   BILITOT 0.2*  --   --    < > = values in this interval not displayed.   ------------------------------------------------------------------------------------------------------------------  Cardiac Enzymes Recent Labs  Lab 10/14/17 1847  TROPONINI <0.03    ------------------------------------------------------------------------------------------------------------------  RADIOLOGY:  Dg Abd 1 View  Result Date: 10/19/2017 CLINICAL DATA:  Abdominal pain EXAM: ABDOMEN - 1 VIEW COMPARISON:  CT abdomen pelvis of 10/14/2017 and SI joint films of 4 levin 2018 FINDINGS: Supine film of the abdomen shows a nonspecific bowel gas pattern. There is colonic bowel gas present with only small amount of small bowel gas. May be mild ileus with slightly prominent small bowel loop adjacent to the region of the pancreas possibly due to the previously demonstrated pancreatitis. No opaque calculi are seen. The bones are unremarkable. IMPRESSION: 1. No bowel obstruction. 2. Possible mild ileus within the upper epigastrium most likely related to the inflammation of pancreatitis. Electronically Signed   By: Ivar Drape M.D.   On: 10/19/2017 09:11     ASSESSMENT AND PLAN:   47 year old female past medical history of hypertension, diabetes, irritable bowel syndrome, hypertriglyceridemia, who presents to the hospital due to abdominal pain nausea and vomiting and noted to have acute pancreatitis.  1.  Acute pancreatitis-secondary to severe hyperlipidemia/hypertriglyceridemia. - CT scan abdomen pelvis is not suggestive of any pancreatic necrosis.  Continue supportive care with aggressive IV fluid hydration, antiemetics, pain control. Pain is slowly getting better. Started on liquid diet now.  2.  Severe hypertriglyceridemia-patient's triglycerides are over 5000 with total cholesterol being over 1200.  Patient is supposed to be on TriCor but unclear if she is compliant with it. - Keep on IV insulin drip in stepdown level of care.  Once her abdominal pain nausea and vomiting improves we can restart her TriCor.  She is intolerant to statins. Triglycerides gradually improving with insulin drip.  Counseled today again in presence of mother and daughter about diet control.  3.   Hyponatremia- likely pseudohyponatremia secondary to some mild hyperglycemia along with dehydration. - We will continue IV fluids, follow sodium. 133- 135 now.  4.  Diabetes type 2 without complication-patient's hemoglobin A1c is as high as 15.  She is only on oral meds, she will likely need to be on insulin prior to discharge.   on the insulin drip given her severe hyper triglyceridemia. -Appreciate diabetes cocordinator input.  5.  Diabetic neuropathy-continue gabapentin.  6.  Essential hypertension- patient cannot tolerate oral meds, continue PRN IV hydralazine.  7. Depression - cont. Effexor.     All the records are reviewed and case discussed with Care Management/Social Worker. Management plans discussed with the patient, family and they are in agreement.  CODE STATUS: Full code  DVT Prophylaxis: Lovenox  TOTAL TIME TAKING CARE OF THIS PATIENT: 32 minutes.  Discussed with her husband and mother in the room.  POSSIBLE D/C IN 2-3 DAYS, DEPENDING ON CLINICAL CONDITION.   Vaughan Basta M.D on 10/20/2017 at 8:16 AM  Between 7am to 6pm - Pager - [phone]  After 6pm go to [email or web address] - Proofreader  [facility]  Office  [phone]  CC: Primary care physician; Leonel Ramsay, MD

## 2017-10-20 NOTE — Progress Notes (Signed)
Report given to Andee Poles, RN via phone.  Patient being moved to room 119.

## 2017-10-20 NOTE — Progress Notes (Signed)
During rounds Dr. Mortimer Fries gave order to discontinue insulin drip and D10.

## 2017-10-21 LAB — BASIC METABOLIC PANEL
Anion gap: 9 (ref 5–15)
BUN: 6 mg/dL (ref 6–20)
CO2: 30 mmol/L (ref 22–32)
CREATININE: 0.78 mg/dL (ref 0.44–1.00)
Calcium: 9.1 mg/dL (ref 8.9–10.3)
Chloride: 98 mmol/L (ref 98–111)
GFR calc Af Amer: >60 mL/min (ref >60)
GLUCOSE: 164 mg/dL — AB (ref 70–99)
Potassium: 4.5 mmol/L (ref 3.5–5.1)
SODIUM: 137 mmol/L (ref 135–145)

## 2017-10-21 LAB — CBC
HCT: 29.9 % — ABNORMAL LOW (ref 35.0–47.0)
Hemoglobin: 10.2 g/dL — ABNORMAL LOW (ref 12.0–16.0)
MCH: 29.3 pg (ref 26.0–34.0)
MCHC: 34.2 g/dL (ref 32.0–36.0)
MCV: 85.9 fL (ref 80.0–100.0)
PLATELETS: 305 10*3/uL (ref 150–440)
RBC: 3.49 MIL/uL — ABNORMAL LOW (ref 3.80–5.20)
RDW: 15.9 % — AB (ref 11.5–14.5)
WBC: 8.7 10*3/uL (ref 3.6–11.0)

## 2017-10-21 LAB — GLUCOSE, CAPILLARY
GLUCOSE-CAPILLARY: 167 mg/dL — AB (ref 70–99)
GLUCOSE-CAPILLARY: 158 mg/dL — AB (ref 70–99)
GLUCOSE-CAPILLARY: 146 mg/dL — AB (ref 70–99)
Glucose-Capillary: 175 mg/dL — ABNORMAL HIGH (ref 70–99)

## 2017-10-21 LAB — LIPASE, BLOOD: Lipase: 34 U/L (ref 11–51)

## 2017-10-21 MED ORDER — ALUM & MAG HYDROXIDE-SIMETH 200-200-20 MG/5ML PO SUSP
30.0000 mL | Freq: Four times a day (QID) | ORAL | Status: DC | PRN
  Administered 2017-10-26: 22:00:00 30 mL via ORAL
  Filled 2017-10-21: qty 30

## 2017-10-21 MED ORDER — ALUM & MAG HYDROXIDE-SIMETH 200-200-20 MG/5ML PO SUSP: 15.0000 mL | Freq: Four times a day (QID) | ORAL | Status: DC | PRN

## 2017-10-21 MED ORDER — FAMOTIDINE 20 MG PO TABS
20.0000 mg | ORAL_TABLET | Freq: Two times a day (BID) | ORAL | Status: DC
  Administered 2017-10-21 – 2017-10-27 (×12): 20 mg via ORAL
  Filled 2017-10-21 (×12): qty 1

## 2017-10-21 NOTE — Progress Notes (Signed)
[facility] [place] NAME: Nayali Talerico    MR#:  [id number]  DATE OF BIRTH:  1970/10/07  SUBJECTIVE:   Patient presents to the hospital due to significant abdominal pain nausea and vomiting and noted to have acute pancreatitis.  Patient also noted to be severely hyperlipidemic with severe hypertriglyceridemia. She confirms less pain and nausea now.  Triglyceride level coming down now with insulin drip. Still keep asking for pain meds.  Insuline stopped. Sent to floor. Repeat CT still shows pancreatitis.  pain is slight better, asking to try full liquids.  REVIEW OF SYSTEMS:    Review of Systems  Constitutional: Negative for chills and fever.  HENT: Negative for congestion and tinnitus.   Eyes: Negative for blurred vision and double vision.  Respiratory: Negative for cough, shortness of breath and wheezing.   Cardiovascular: Negative for chest pain, orthopnea and PND.  Gastrointestinal: Positive for abdominal pain, nausea and vomiting. Negative for diarrhea.  Genitourinary: Negative for dysuria and hematuria.  Neurological: Negative for dizziness, sensory change and focal weakness.  All other systems reviewed and are negative.   Nutrition: NPO Tolerating Diet: Donne Hazel PT: Ambulatory   DRUG ALLERGIES:   Allergies  Allergen Reactions  . Atorvastatin     Other reaction(s): Muscle Pain  . Levofloxacin Hives  . Morphine Nausea And Vomiting    Other reaction(s): Vomiting REACTION: Headache REACTION: Headache  . Penicillins Rash    Has patient had a PCN reaction causing immediate rash, facial/tongue/throat swelling, SOB or lightheadedness with hypotension: Yes Has patient had a PCN reaction causing severe rash involving mucus membranes or skin necrosis: No Has patient had a PCN reaction that required hospitalization: No Has patient had a PCN reaction occurring within the last 10 years: No. If all of the above answers are "NO",  then may proceed with Cephalosporin use.  . Tramadol Hcl Nausea And Vomiting    Other reaction(s): Vomiting "feels weird"    VITALS:  Blood pressure (!) 160/96, pulse 92, temperature 98.3 F (36.8 C), temperature source Oral, resp. rate 16, height 5\' 3"  (1.6 m), weight 80.4 kg, SpO2 98 %.  PHYSICAL EXAMINATION:   Physical Exam  GENERAL:  47 y.o.-year-old patient lying in bed in no acute distress.  EYES: Pupils equal, round, reactive to light and accommodation. No scleral icterus. Extraocular muscles intact.  HEENT: Head atraumatic, normocephalic. Oropharynx and nasopharynx clear.  NECK:  Supple, no jugular venous distention. No thyroid enlargement, no tenderness.  LUNGS: Normal breath sounds bilaterally, no wheezing, rales, rhonchi. No use of accessory muscles of respiration.  CARDIOVASCULAR: S1, S2 normal. No murmurs, rubs, or gallops.  ABDOMEN: Soft, Tender in Epigastric area, no rebound, rigidity, nondistended. Bowel sounds present. No organomegaly or mass.  EXTREMITIES: No cyanosis, clubbing or edema b/l.    NEUROLOGIC: Cranial nerves II through XII are intact. No focal Motor or sensory deficits b/l.   PSYCHIATRIC: The patient is alert and oriented x 3.  SKIN: No obvious rash, lesion, or ulcer.    LABORATORY PANEL:   CBC Recent Labs  Lab 10/21/17 0442  WBC 8.7  HGB 10.2*  HCT 29.9*  PLT 305   ------------------------------------------------------------------------------------------------------------------  Chemistries  Recent Labs  Lab 10/14/17 1748  10/20/17 0512 10/21/17 0442  NA 131*   < > 134* 137  K 4.2   < > 4.3 4.5  CL 98   < > 102 98  CO2 21*   < > 25 30  GLUCOSE  356*   < > 309* 164*  BUN 9   < > 6 6  CREATININE 0.67   < > 0.69 0.78  CALCIUM 9.2   < > 8.6* 9.1  MG  --    < > 1.9  --   AST 53*  --   --   --   ALT 36  --   --   --   ALKPHOS 92  --   --   --   BILITOT 0.2*  --   --   --    < > = values in this interval not displayed.    ------------------------------------------------------------------------------------------------------------------  Cardiac Enzymes Recent Labs  Lab 10/14/17 1847  TROPONINI <0.03   ------------------------------------------------------------------------------------------------------------------  RADIOLOGY:  Ct Abdomen Pelvis Wo Contrast  Result Date: 10/20/2017 CLINICAL DATA:  Abdominal pain, distension, nausea.  Pancreatitis. EXAM: CT ABDOMEN AND PELVIS WITHOUT CONTRAST TECHNIQUE: Multidetector CT imaging of the abdomen and pelvis was performed following the standard protocol without IV contrast. COMPARISON:  10/14/2017 FINDINGS: Lower chest: Linear scarring/atelectasis in the right lower lobe. Hepatobiliary: Mild hepatic steatosis. Status post cholecystectomy. No intrahepatic or extrahepatic ductal dilatation. Pancreas: Peripancreatic fluid/inflammatory changes, predominantly along the pancreatic head/uncinate process. No drainable fluid collection/pseudocyst. Spleen: Within normal limits. Adrenals/Urinary Tract: Adrenal glands within normal limits. Kidneys are within normal limits. Mild perinephric fluid along the right kidney. No renal calculi or hydronephrosis. Bladder is within normal limits. Stomach/Bowel: Stomach is within normal limits. Fluid/edema along the 2nd/3rd portion of the duodenum, likely related to the patient's pancreatitis. No convincing duodenal wall thickening. No evidence bowel obstruction. Normal appendix (series 2/image 63). Vascular/Lymphatic: No evidence of abdominal aortic aneurysm. No suspicious abdominopelvic lymphadenopathy. Reproductive: Status post hysterectomy. Bilateral ovaries are within normal limits. Other: Trace ascites along the right pericolic gutter. Trace right pelvic ascites. Musculoskeletal: Mild degenerative changes of the lower thoracic spine. IMPRESSION: Acute pancreatitis, without drainable fluid collection/pseudocyst. Secondary fluid/ascites along  the duodenum, right kidney, right pericolic gutter, and right pelvis. This has mildly progressed. Electronically Signed   By: Julian Hy M.D.   On: 10/20/2017 10:53     ASSESSMENT AND PLAN:   47 year old female past medical history of hypertension, diabetes, irritable bowel syndrome, hypertriglyceridemia, who presents to the hospital due to abdominal pain nausea and vomiting and noted to have acute pancreatitis.  1.  Acute pancreatitis-secondary to severe hyperlipidemia/hypertriglyceridemia. - CT scan abdomen pelvis is not suggestive of any pancreatic necrosis.  Continue supportive care with aggressive IV fluid hydration, antiemetics, pain control. Pain is slowly getting better. Started on liquid diet now.  Still have pancreatitis on CT, cont liquid diet, check Lipase.  Added long acting oxycodone for better pain control. Upgrading diet to full liquid now.  Patient is motivated to use oral pain medication and limit the use of IV Dilaudid.  2.  Severe hypertriglyceridemia-patient's triglycerides are over 5000 with total cholesterol being over 1200.  Patient is supposed to be on TriCor but unclear if she is compliant with it. - Keep on IV insulin drip in stepdown level of care.  Once her abdominal pain nausea and vomiting improves we can restart her TriCor.  She is intolerant to statins. Triglycerides gradually improving with insulin drip.  Counseled today again in presence of mother and daughter about diet control.  Stopped insuline drip, will start on basal insuline now.  3.  Hyponatremia- likely pseudohyponatremia secondary to some mild hyperglycemia along with dehydration. - We will continue IV fluids, follow sodium. 133- 135 -137 now.  4.  Diabetes type 2 without complication-patient's hemoglobin A1c is as high as 15.  She is only on oral meds, she will likely need to be on insulin prior to discharge.   on the insulin drip given her severe hyper triglyceridemia. -Appreciate  diabetes cocordinator input.  5.  Diabetic neuropathy-continue gabapentin.  6.  Essential hypertension-   Started metoprolol, hydralazine pRN.  7. Depression - cont. Effexor.    All the records are reviewed and case discussed with Care Management/Social Worker. Management plans discussed with the patient, family and they are in agreement.  CODE STATUS: Full code  DVT Prophylaxis: Lovenox  TOTAL TIME TAKING CARE OF THIS PATIENT: 32 minutes.  Discussed with her daughter in the room.  POSSIBLE D/C IN 1-2 DAYS, DEPENDING ON CLINICAL CONDITION.   Vaughan Basta M.D on 10/21/2017 at 2:32 PM  Between 7am to 6pm - Pager - [phone]  After 6pm go to [email or web address] - Proofreader  [facility]  Office  [phone]  CC: Primary care physician; Leonel Ramsay, MD

## 2017-10-21 NOTE — Progress Notes (Signed)
PHARMACIST - PHYSICIAN COMMUNICATION  DR:   Anselm Jungling  CONCERNING: IV to Oral Route Change Policy  RECOMMENDATION: This patient is receiving famotidine by the intravenous route.  Based on criteria approved by the Pharmacy and Therapeutics Committee, the intravenous medication(s) is/are being converted to the equivalent oral dose form(s).   DESCRIPTION: These criteria include:  The patient is eating (either orally or via tube) and/or has been taking other orally administered medications for a least 24 hours  The patient has no evidence of active gastrointestinal bleeding or impaired GI absorption (gastrectomy, short bowel, patient on TNA or NPO).  If you have questions about this conversion, please contact the Pharmacy Department  []   [phone] )  Kelly Rollins [x]   [phone] )  [facility] []   [phone] )  [facility] []   [phone] )  [facility] []   [phone] )  [place], [facility] 10/21/2017 1:38 PM

## 2017-10-21 NOTE — Progress Notes (Signed)
Inpatient Diabetes Program Recommendations  AACE/ADA: New Consensus Statement on Inpatient Glycemic Control (2019)  Target Ranges:  Prepandial:   less than 140 mg/dL      Peak postprandial:   less than 180 mg/dL (1-2 hours)      Critically ill patients:  140 - 180 mg/dL   Results for Kelly Rollins, SEGEL (MRN [id number]) as of 10/21/2017 14:20  Ref. Range 10/20/2017 07:35 10/20/2017 11:30 10/20/2017 17:13 10/20/2017 21:08 10/21/2017 08:03 10/21/2017 12:22  Glucose-Capillary Latest Ref Range: 70 - 99 mg/dL 284 (H) 249 (H) 172 (H) 186 (H) 167 (H) 158 (H)   Review of Glycemic Control  Diabetes history: DM2 Outpatient Diabetes medications: Metformin 500 mg BID Current orders for Inpatient glycemic control: Levemir 15 units QHS, Novolog 0-15 units TID with meals, Novolog 0-5 units QHS  NOTE: CBGs well controlled today with Levemir and Novolog insulin. No new recommendations for insulin changes.  Met with patient again and reviewed insulin administration with vial/syringe. Patient was able to successfully demonstrate how to draw up and inject insulin with vial/syringe. Patient was able to answer questions regarding information that has been discussed about DM and insulin over the past few days. Encouraged patient to read Living Well with Diabetes Book and insulin starter kit while inpatient. Patient reports that she has not self-injected insulin yet. Informed patient it will be requested that RNs allow her to self inject insulin. Patient verbalized understanding of information and she states that she has no further questions at this time related to DM or insulin.   Patient will be getting discharge medications [facility] at [facility] and they have Levemir and Humalog insulin in vials available.  At time of discharge, please provide Rx for: Levemir and Humalog insulin vials (if discharged on basal and bolus insulin) and insulin syringes  Thanks, Barnie Alderman, RN, MSN, CDE Diabetes  Coordinator Inpatient Diabetes Program [phone] (Team Pager from 8am to 5pm)

## 2017-10-21 NOTE — Progress Notes (Signed)
Pharmacy Electrolyte Monitoring Consult:  Pharmacy consulted to assist in monitoring and replacing electrolytes in this 47 y.o. female admitted on 10/14/2017 with pancreatitis. Patient transferred to the ICU for triglycerides over 5000 for management with an insulin drip. Has transitioned off insulin drip and out of ICU.  Labs:  Sodium (mmol/L)  Date Value  10/21/2017 137  05/06/2017 135  03/18/2013 137   Potassium (mmol/L)  Date Value  10/21/2017 4.5  03/18/2013 3.9   Magnesium (mg/dL)  Date Value  10/20/2017 1.9  04/27/2013 1.8   Phosphorus (mg/dL)  Date Value  10/20/2017 4.4   Calcium (mg/dL)  Date Value  10/21/2017 9.1   Calcium, Total (mg/dL)  Date Value  03/18/2013 9.4   Albumin (g/dL)  Date Value  10/16/2017 2.8 (L)  05/06/2017 4.1  03/18/2013 3.9    Assessment/Plan: Off Insulin drip now. Electrolytes WNL. No supplementation needed.  Goal potassium ~4 and magnesium ~ 2.  Will monitor BMP.   Pharmacy to continue to follow patient and replace electrolytes per consult.  Paulina Fusi, PharmD, BCPS 10/21/2017 1:37 PM

## 2017-10-22 LAB — GLUCOSE, CAPILLARY
GLUCOSE-CAPILLARY: 192 mg/dL — AB (ref 70–99)
GLUCOSE-CAPILLARY: 180 mg/dL — AB (ref 70–99)
Glucose-Capillary: 122 mg/dL — ABNORMAL HIGH (ref 70–99)
Glucose-Capillary: 174 mg/dL — ABNORMAL HIGH (ref 70–99)
Glucose-Capillary: 187 mg/dL — ABNORMAL HIGH (ref 70–99)

## 2017-10-22 MED ORDER — HYDROMORPHONE HCL 1 MG/ML IJ SOLN
0.5000 mg | INTRAMUSCULAR | Status: DC | PRN
  Administered 2017-10-23 – 2017-10-25 (×5): 0.5 mg via INTRAVENOUS
  Filled 2017-10-22 (×8): qty 1

## 2017-10-22 MED ORDER — OXYCODONE HCL ER 15 MG PO T12A
15.0000 mg | EXTENDED_RELEASE_TABLET | Freq: Two times a day (BID) | ORAL | Status: DC
  Administered 2017-10-22 – 2017-10-27 (×11): 15 mg via ORAL
  Filled 2017-10-22 (×11): qty 1

## 2017-10-22 MED ORDER — SENNOSIDES-DOCUSATE SODIUM 8.6-50 MG PO TABS
1.0000 | ORAL_TABLET | Freq: Two times a day (BID) | ORAL | Status: DC
  Administered 2017-10-22 – 2017-10-27 (×6): 1 via ORAL
  Filled 2017-10-22 (×9): qty 1

## 2017-10-22 NOTE — Progress Notes (Signed)
[place] [facility] [place] NAME: Meah Jiron    MR#:  [id number]  DATE OF BIRTH:  November 15, 1970  SUBJECTIVE:   Patient presents to the hospital due to significant abdominal pain nausea and vomiting and noted to have acute pancreatitis.  Patient also noted to be severely hyperlipidemic with severe hypertriglyceridemia. She confirms less pain and nausea now.  Triglyceride level coming down now with insulin drip. Still keep asking for pain meds.  Insuline stopped. Sent to floor. Repeat CT still shows pancreatitis.  pain is slight better, related full liquid diet yesterday. Today morning after having soft diet complain of some worsening in the pain.  I advised to go slow on the diet.  REVIEW OF SYSTEMS:    Review of Systems  Constitutional: Negative for chills and fever.  HENT: Negative for congestion and tinnitus.   Eyes: Negative for blurred vision and double vision.  Respiratory: Negative for cough, shortness of breath and wheezing.   Cardiovascular: Negative for chest pain, orthopnea and PND.  Gastrointestinal: Positive for abdominal pain, nausea and vomiting. Negative for diarrhea.  Genitourinary: Negative for dysuria and hematuria.  Neurological: Negative for dizziness, sensory change and focal weakness.  All other systems reviewed and are negative.   Nutrition: NPO Tolerating Diet: Yes Tolerating PT: Ambulatory   DRUG ALLERGIES:   Allergies  Allergen Reactions  . Atorvastatin     Other reaction(s): Muscle Pain  . Levofloxacin Hives  . Morphine Nausea And Vomiting    Other reaction(s): Vomiting REACTION: Headache REACTION: Headache  . Penicillins Rash    Has patient had a PCN reaction causing immediate rash, facial/tongue/throat swelling, SOB or lightheadedness with hypotension: Yes Has patient had a PCN reaction causing severe rash involving mucus membranes or skin necrosis: No Has patient had a PCN reaction that required  hospitalization: No Has patient had a PCN reaction occurring within the last 10 years: No. If all of the above answers are "NO", then may proceed with Cephalosporin use.  . Tramadol Hcl Nausea And Vomiting    Other reaction(s): Vomiting "feels weird"    VITALS:  Blood pressure 136/69, pulse 89, temperature 97.9 F (36.6 C), temperature source Oral, resp. rate 20, height 5\' 3"  (1.6 m), weight 80.4 kg, SpO2 97 %.  PHYSICAL EXAMINATION:   Physical Exam  GENERAL:  47 y.o.-year-old patient lying in bed in no acute distress.  EYES: Pupils equal, round, reactive to light and accommodation. No scleral icterus. Extraocular muscles intact.  HEENT: Head atraumatic, normocephalic. Oropharynx and nasopharynx clear.  NECK:  Supple, no jugular venous distention. No thyroid enlargement, no tenderness.  LUNGS: Normal breath sounds bilaterally, no wheezing, rales, rhonchi. No use of accessory muscles of respiration.  CARDIOVASCULAR: S1, S2 normal. No murmurs, rubs, or gallops.  ABDOMEN: Soft, Tender in Epigastric area, no rebound, rigidity, nondistended. Bowel sounds present. No organomegaly or mass.  EXTREMITIES: No cyanosis, clubbing or edema b/l.    NEUROLOGIC: Cranial nerves II through XII are intact. No focal Motor or sensory deficits b/l.   PSYCHIATRIC: The patient is alert and oriented x 3.  SKIN: No obvious rash, lesion, or ulcer.    LABORATORY PANEL:   CBC Recent Labs  Lab 10/21/17 0442  WBC 8.7  HGB 10.2*  HCT 29.9*  PLT 305   ------------------------------------------------------------------------------------------------------------------  Chemistries  Recent Labs  Lab 10/20/17 0512 10/21/17 0442  NA 134* 137  K 4.3 4.5  CL 102 98  CO2 25 30  GLUCOSE 309* 164*  BUN 6 6  CREATININE 0.69 0.78  CALCIUM 8.6* 9.1  MG 1.9  --    ------------------------------------------------------------------------------------------------------------------  Cardiac Enzymes No results  for input(s): TROPONINI in the last 168 hours. ------------------------------------------------------------------------------------------------------------------  RADIOLOGY:  No results found.   ASSESSMENT AND PLAN:   47 year old female past medical history of hypertension, diabetes, irritable bowel syndrome, hypertriglyceridemia, who presents to the hospital due to abdominal pain nausea and vomiting and noted to have acute pancreatitis.  1.  Acute pancreatitis-secondary to severe hyperlipidemia/hypertriglyceridemia. - CT scan abdomen pelvis is not suggestive of any pancreatic necrosis.  Continue supportive care with aggressive IV fluid hydration, antiemetics, pain control. Pain is slowly getting better. Started on liquid diet now.  Still have pancreatitis on CT, cont liquid diet, check Lipase.  Added long acting oxycodone for better pain control. Upgrading diet to full liquid now.  Patient is motivated to use oral pain medication and limit the use of IV Dilaudid.  2.  Severe hypertriglyceridemia-patient's triglycerides are over 5000 with total cholesterol being over 1200.  Patient is supposed to be on TriCor but unclear if she is compliant with it. - Keep on IV insulin drip in stepdown level of care.  Once her abdominal pain nausea and vomiting improves we can restart her TriCor.  She is intolerant to statins. Triglycerides gradually improving with insulin drip.  Counseled today again in presence of mother and daughter about diet control.  Stopped insuline drip,  on basal insuline now.  3.  Hyponatremia- likely pseudohyponatremia secondary to some mild hyperglycemia along with dehydration. - We will continue IV fluids, follow sodium. 133- 135 -137 now.  4.  Diabetes type 2 without complication-patient's hemoglobin A1c is as high as 15.  She is only on oral meds, she will likely need to be on insulin prior to discharge.   on the insulin drip given her severe hyper  triglyceridemia. -Appreciate diabetes cocordinator input.  5.  Diabetic neuropathy-continue gabapentin.  6.  Essential hypertension-   Started metoprolol, hydralazine pRN.  7. Depression - cont. Effexor.    All the records are reviewed and case discussed with Care Management/Social Worker. Management plans discussed with the patient, family and they are in agreement.  CODE STATUS: Full code  DVT Prophylaxis: Lovenox  TOTAL TIME TAKING CARE OF THIS PATIENT: 32 minutes.  Discussed with her daughter in the room.  POSSIBLE D/C IN 1-2 DAYS, DEPENDING ON CLINICAL CONDITION.   Vaughan Basta M.D on 10/22/2017 at 2:40 PM  Between 7am to 6pm - Pager - [phone]  After 6pm go to [email or web address] - Proofreader  [facility]  Office  [phone]  CC: Primary care physician; Leonel Ramsay, MD

## 2017-10-22 NOTE — Progress Notes (Signed)
Pt feeling better this pm less pain. Up to chair earlier and tol well.

## 2017-10-22 NOTE — Progress Notes (Signed)
Pharmacy Electrolyte Monitoring Consult:  Pharmacy consulted to assist in monitoring and replacing electrolytes in this 46 y.o. female admitted on 10/14/2017 with pancreatitis. Patient transferred to the ICU for triglycerides over 5000 for management with an insulin drip. Has transitioned off insulin drip and out of ICU.  Labs:  Sodium (mmol/L)  Date Value  10/21/2017 137  05/06/2017 135  03/18/2013 137   Potassium (mmol/L)  Date Value  10/21/2017 4.5  03/18/2013 3.9   Magnesium (mg/dL)  Date Value  10/20/2017 1.9  04/27/2013 1.8   Phosphorus (mg/dL)  Date Value  10/20/2017 4.4   Calcium (mg/dL)  Date Value  10/21/2017 9.1   Calcium, Total (mg/dL)  Date Value  03/18/2013 9.4   Albumin (g/dL)  Date Value  10/16/2017 2.8 (L)  05/06/2017 4.1  03/18/2013 3.9    Assessment/Plan: Off Insulin drip now.  Goal potassium ~4 and magnesium ~ 2. F/U next K and Phos/Mag on 9/21.   Pharmacy to continue to follow patient and replace electrolytes per consult.  Chinita Greenland PharmD Clinical Pharmacist 10/22/2017

## 2017-10-22 NOTE — Plan of Care (Signed)

## 2017-10-22 NOTE — Progress Notes (Signed)
Nutrition Brief Note  RD pulled to chart due to LOS (day 8)  Wt Readings from Last 15 Encounters:  10/15/17 80.4 kg  05/24/17 86.5 kg  05/19/17 85.7 kg  05/06/17 82.6 kg  04/14/17 83.9 kg  02/22/17 84.7 kg  02/03/17 85.3 kg  01/18/17 84.4 kg  10/21/16 81.2 kg  09/08/16 80.7 kg  08/24/16 83.9 kg  07/21/16 89.4 kg  06/23/16 86.2 kg  06/08/16 86.2 kg  04/28/16 82.6 kg   Kelly Rollins  is a 47 y.o. female with a known history of pancreatitis due to hyper lipidemia and diabetes who presents emergency room due to abdominal pain.  Pt admitted with acute pancreatitis.   Reviewed chart and weight hx; pt wt has been stable > 1 year.   Reviewed DM coordinator note, pt is going to transition to home insulin. RN has been completing bedside education and allowing pt to self-administer injections. Plan to pt to follow-up with [facility] and [facility] due to uninsured status.   Per MD notes, pt likely to d/c home in 1-2 days.   Last Hgb A1c: 14.5 (10/14/17). PTA DM medications are 500 mg metformin BID.   Labs reviewed: CBGS: 122-175 (inpatient orders for glycemic control are 0-15 units insulin aspart TID with meals, 0-5 units insulin aspart q HS, and 15 units insulin detemir daily).   Body mass index is 31.4 kg/m. Patient meets criteria for obesity, class I based on current BMI.   Current diet order is soft, patient is consuming approximately 40-100% of meals at this time. Labs and medications reviewed.   No nutrition interventions warranted at this time. If nutrition issues arise, please consult RD.   Worth Kober A. Jimmye Norman, RD, LDN, CDE Pager: [phone] After hours Pager: [phone]

## 2017-10-22 NOTE — Progress Notes (Signed)
Inpatient Diabetes Program Recommendations  AACE/ADA: New Consensus Statement on Inpatient Glycemic Control (2019)  Target Ranges:  Prepandial:   less than 140 mg/dL      Peak postprandial:   less than 180 mg/dL (1-2 hours)      Critically ill patients:  140 - 180 mg/dL   Results for Kelly Rollins, Kelly Rollins (MRN [id number]) as of 10/22/2017 07:55  Ref. Range 10/21/2017 08:03 10/21/2017 12:22 10/21/2017 17:25 10/21/2017 20:37 10/22/2017 07:44  Glucose-Capillary Latest Ref Range: 70 - 99 mg/dL 167 (H)  Novolog 3 units  Levemir 15 units 158 (H)  Novolog 3 units 175 (H)  Novolog 3 units 146 (H) 122 (H)   Review of Glycemic Control  Diabetes history: DM2 Outpatient Diabetes medications: Metformin 500 mg BID Current orders for Inpatient glycemic control: Levemir 15 units daily, Novolog 0-15 units TID with meals, Novolog 0-5 units QHS   Inpatient Diabetes Program Recommendations:  Outpatient DM medications: CBGs fairly well controlled with Levemir 15 units daily and Novolog moderate correction scale. At time of discharge, recommend patient be discharged on same dose of Levemir as being used as an inpatient and also on Humalog TID per correction scale (using same correction scale as currently ordered inpatient). Patient will need to monitor glucose 3-4 times per day and follow up at [facility].  Patient will be getting discharge medications filled at [facility] and they have Levemir and Humalog insulin in vials available.  At time of discharge, please provide Rx for: Levemir and Humalog insulin vials (if discharged on basal and bolus insulin) and insulin syringes.  Thanks, Barnie Alderman, RN, MSN, CDE Diabetes Coordinator Inpatient Diabetes Program [phone] (Team Pager from 8am to 5pm)

## 2017-10-23 ENCOUNTER — Inpatient Hospital Stay: Payer: Medicaid Other

## 2017-10-23 DIAGNOSIS — E781 Pure hyperglyceridemia: Secondary | ICD-10-CM

## 2017-10-23 LAB — LIPID PANEL
Cholesterol: 279 mg/dL — ABNORMAL HIGH (ref 0–200)
HDL: 17 mg/dL — ABNORMAL LOW (ref >40)
LDL Cholesterol: UNDETERMINED mg/dL (ref 0–99)
Total CHOL/HDL Ratio: 16.4 RATIO
Triglycerides: 427 mg/dL — ABNORMAL HIGH (ref <150)
VLDL: UNDETERMINED mg/dL (ref 0–40)

## 2017-10-23 LAB — COMPREHENSIVE METABOLIC PANEL
ALBUMIN: 2.3 g/dL — AB (ref 3.5–5.0)
ALK PHOS: 142 U/L — AB (ref 38–126)
ALT: 14 U/L (ref 0–44)
AST: 16 U/L (ref 15–41)
Anion gap: 12 (ref 5–15)
BUN: 12 mg/dL (ref 6–20)
CHLORIDE: 95 mmol/L — AB (ref 98–111)
CO2: 28 mmol/L (ref 22–32)
CREATININE: 0.87 mg/dL (ref 0.44–1.00)
Calcium: 8.8 mg/dL — ABNORMAL LOW (ref 8.9–10.3)
GFR calc Af Amer: >60 mL/min (ref >60)
GFR calc non Af Amer: >60 mL/min (ref >60)
GLUCOSE: 182 mg/dL — AB (ref 70–99)
Potassium: 3.9 mmol/L (ref 3.5–5.1)
SODIUM: 135 mmol/L (ref 135–145)
Total Bilirubin: 0.4 mg/dL (ref 0.3–1.2)
Total Protein: 6.4 g/dL — ABNORMAL LOW (ref 6.5–8.1)

## 2017-10-23 LAB — GLUCOSE, CAPILLARY
GLUCOSE-CAPILLARY: 205 mg/dL — AB (ref 70–99)
GLUCOSE-CAPILLARY: 103 mg/dL — AB (ref 70–99)
Glucose-Capillary: 184 mg/dL — ABNORMAL HIGH (ref 70–99)
Glucose-Capillary: 99 mg/dL (ref 70–99)

## 2017-10-23 LAB — POTASSIUM: Potassium: 3.9 mmol/L (ref 3.5–5.1)

## 2017-10-23 LAB — LIPASE, BLOOD: Lipase: 27 U/L (ref 11–51)

## 2017-10-23 LAB — MAGNESIUM: MAGNESIUM: 2.1 mg/dL (ref 1.7–2.4)

## 2017-10-23 MED ORDER — LACTATED RINGERS IV SOLN
INTRAVENOUS | Status: DC
  Administered 2017-10-23 – 2017-10-25 (×6): via INTRAVENOUS

## 2017-10-23 MED ORDER — SODIUM CHLORIDE 0.9 % IV SOLN
INTRAVENOUS | Status: DC
  Administered 2017-10-23: 11:00:00 via INTRAVENOUS

## 2017-10-23 MED ORDER — IOPAMIDOL (ISOVUE-300) INJECTION 61%
15.0000 mL | INTRAVENOUS | Status: AC
  Administered 2017-10-23: 15 mL via ORAL

## 2017-10-23 MED ORDER — POLYETHYLENE GLYCOL 3350 17 G PO PACK
17.0000 g | PACK | Freq: Two times a day (BID) | ORAL | Status: DC
  Administered 2017-10-23 – 2017-10-27 (×5): 17 g via ORAL
  Filled 2017-10-23 (×8): qty 1

## 2017-10-23 NOTE — Consult Note (Signed)
[name], MD [address]  [place], [place] [address]  Main: [phone]  Fax: [phone] Pager: [phone]   Consultation  Referring Provider:     No ref. provider found Primary Care Physician:  Leonel Ramsay, MD Primary Gastroenterologist:  Dr. Fuller Plan         Reason for Consultation:     Acute pancreatitis  Date of Admission:  10/14/2017 Date of Consultation:  10/23/2017         HPI:   Kelly Rollins is a 47 y.o. female with history of diabetes, hypertriglyceridemia, known history of pancreatitis secondary to hypertriglyceridemia was initially admitted on 10/14/2017 with acute pancreatitis secondary to hypertriglyceridemia. Patient presented with acute onset of epigastric pain associated nausea and vomiting after eating sandwich. She was found to have serum lipase in 300s, serum triglycerides >5000. CT revealed peripancreatic inflammation consistent with acute pancreatitis. There is no evidence of pancreatic ductal dilation or CBD obstruction. Her LFTs were normal. Patient has been on insulin drip for elevated triglyceride levels and currently, her triglycerides are down to 427. Her symptoms were slowly improving, and patient was tolerating liquid diet, diet was advanced to soft yesterday which resulted in worsening of epigastric pain. Patient had repeat CT which did not suggest peripancreatic fluid collections/necrosis.   GI is consulted due to worsening of symptoms. When I interviewed the patient, she was in moderate degree of pain, worse in left upper quadrant, her last bowel movement was 1 week ago and she feels like she needed to have a BM. She also reports bloating. She does not have leukocytosis, has low-grade fever. She is on maintenance IV fluids. She reports passing gas  NSAIDs: none  Antiplts/Anticoagulants/Anti thrombotics: none  GI Procedures: EGD 02/28/2010 Erosive esophagitis Colonoscopy 02/28/2010 2 polyps were removed, 7 mm in  AC and 3 mm in rectum Surgical [P], ascending colon and rectum - HYPERPLASTIC POLYPS. - THERE IS NO EVIDENCE OF MALIGNANCY.  Past Medical History:  Diagnosis Date  . ABDOMINAL PAIN OTHER SPECIFIED SITE 02/21/2009   Qualifier: Diagnosis of  By: Deborra Medina MD, Tanja Port    . ABDOMINAL PAIN-LUQ 04/17/2009   Qualifier: Diagnosis of  By: Fuller Plan MD Marijo Conception Anal fissure   . Asthma   . Diabetes mellitus without complication ([place])   . Headache(784.0)   . HEMATOCHEZIA 04/17/2009   Qualifier: Diagnosis of  By: Fuller Plan MD Marijo Conception Hypertension   . IBS (irritable bowel syndrome)   . Migraine headache 11/06/2008   Qualifier: Diagnosis of  By: Deborra Medina MD, Tanja Port    . OVARIAN CYST, RIGHT 02/21/2009   Qualifier: Diagnosis of  By: Deborra Medina MD, Tanja Port    . PALPITATIONS, OCCASIONAL 04/17/2010   Qualifier: Diagnosis of  By: Deborra Medina MD, Talia      Past Surgical History:  Procedure Laterality Date  . BREAST REDUCTION SURGERY     bilateral  . CESAREAN SECTION    . CHOLECYSTECTOMY    . VAGINAL HYSTERECTOMY      Prior to Admission medications   Medication Sig Start Date End Date Taking? Authorizing Provider  fenofibrate (TRICOR) 145 MG tablet Take 145 mg by mouth daily. 08/26/17  Yes [provider]  gabapentin (NEURONTIN) 100 MG capsule Take 100 mg by mouth daily as needed (nerve pain).   Yes [provider]  metFORMIN (GLUCOPHAGE) 500 MG tablet Take 500 mg by mouth 2 (two) times daily with a meal.  10/06/16  10/18/17 Yes [provider]  metoprolol succinate (TOPROL-XL) 100 MG 24 hr tablet Take 100 mg by mouth daily.  11/09/16  Yes [provider]  venlafaxine XR (EFFEXOR-XR) 37.5 MG 24 hr capsule Take 37.5 mg by mouth daily. 10/04/17  Yes [provider]  Calcium Carbonate-Vit D-Min (GNP CALCIUM 1200) 1200-1000 MG-UNIT CHEW Chew 1,200 mg by mouth daily with breakfast. Take in combination with vitamin D and magnesium. Patient not taking: Reported on 10/18/2017 05/19/17  11/15/17  Milinda Pointer, MD  Cholecalciferol (VITAMIN D3) 5000 units CAPS Take 1 capsule (5,000 Units total) by mouth daily with breakfast. Take along with calcium and magnesium. Patient not taking: Reported on 10/18/2017 05/19/17 11/15/17  Milinda Pointer, MD  cyclobenzaprine (FLEXERIL) 5 MG tablet Take 1 tablet (5 mg total) by mouth 3 (three) times daily as needed for muscle spasms. Patient not taking: Reported on 10/18/2017 05/19/17 11/15/17  Milinda Pointer, MD  gabapentin (NEURONTIN) 300 MG capsule Take 1 capsule (300 mg total) by mouth at bedtime. Patient not taking: Reported on 10/18/2017 05/19/17 11/15/17  Milinda Pointer, MD  Magnesium 500 MG CAPS Take 1 capsule (500 mg total) by mouth 2 (two) times daily at 8 am and 10 pm. Patient not taking: Reported on 10/18/2017 05/19/17 11/15/17  Milinda Pointer, MD  naproxen (EC NAPROSYN) 500 MG EC tablet Take 1 tablet (500 mg total) by mouth 2 (two) times daily with a meal. Patient not taking: Reported on 10/18/2017 05/06/17 05/06/18  Vevelyn Francois, NP  omeprazole (PRILOSEC) 20 MG capsule Take 1 capsule (20 mg total) by mouth daily. Patient not taking: Reported on 10/18/2017 05/19/17 05/19/18  Milinda Pointer, MD    Current Facility-Administered Medications:  .  acetaminophen (TYLENOL) tablet 650 mg, 650 mg, Oral, Q6H PRN **OR** acetaminophen (TYLENOL) suppository 650 mg, 650 mg, Rectal, Q6H PRN, Mody, Sital, MD .  alum & mag hydroxide-simeth (MAALOX/MYLANTA) 200-200-20 MG/5ML suspension 30 mL, 30 mL, Oral, Q6H PRN, Vaughan Basta, MD .  aspirin-acetaminophen-caffeine (EXCEDRIN MIGRAINE) per tablet 2 tablet, 2 tablet, Oral, Q6H PRN, Conforti, John, DO, 2 tablet at 10/22/17 0433 .  bisacodyl (DULCOLAX) suppository 10 mg, 10 mg, Rectal, Daily PRN, Mody, Sital, MD .  calcium citrate-vitamin D 500-500 MG-UNIT per chewable tablet 2 tablet, 2 tablet, Oral, Q breakfast, Henreitta Leber, MD, 2 tablet at 10/23/17 0835 .  cholecalciferol  (VITAMIN D) tablet 5,000 Units, 5,000 Units, Oral, Q breakfast, Bettey Costa, MD, 5,000 Units at 10/23/17 0835 .  cyclobenzaprine (FLEXERIL) tablet 5 mg, 5 mg, Oral, TID PRN, Bettey Costa, MD, 5 mg at 10/16/17 1627 .  dextrose 50 % solution 50 mL, 1 ampule, Intravenous, PRN, Sainani, Vivek J, MD .  diphenhydrAMINE (BENADRYL) capsule 25 mg, 25 mg, Oral, Q6H PRN, Awilda Bill, NP, 25 mg at 10/18/17 2222 .  enoxaparin (LOVENOX) injection 40 mg, 40 mg, Subcutaneous, Q24H, Sainani, Belia Heman, MD, 40 mg at 10/22/17 2117 .  famotidine (PEPCID) tablet 20 mg, 20 mg, Oral, BID, Vaughan Basta, MD, 20 mg at 10/23/17 1033 .  gabapentin (NEURONTIN) capsule 300 mg, 300 mg, Oral, QHS, Mody, Sital, MD, 300 mg at 10/22/17 2114 .  hydrALAZINE (APRESOLINE) injection 10 mg, 10 mg, Intravenous, Q6H PRN, Henreitta Leber, MD .  HYDROcodone-acetaminophen (NORCO/VICODIN) 5-325 MG per tablet 1-2 tablet, 1-2 tablet, Oral, Q4H PRN, Bettey Costa, MD, 2 tablet at 10/23/17 1540 .  HYDROmorphone (DILAUDID) injection 0.5 mg, 0.5 mg, Intravenous, Q1H PRN, Vaughan Basta, MD, 0.5 mg at 10/23/17 1243 .  Influenza vac  split quadrivalent PF (FLUARIX) injection 0.5 mL, 0.5 mL, Intramuscular, Tomorrow-1000, Bettey Costa, MD, Stopped at 10/16/17 1107 .  insulin aspart (novoLOG) injection 0-15 Units, 0-15 Units, Subcutaneous, TID WC, Flora Lipps, MD, 3 Units at 10/23/17 1242 .  insulin aspart (novoLOG) injection 0-5 Units, 0-5 Units, Subcutaneous, QHS, Kasa, Kurian, MD .  insulin detemir (LEVEMIR) injection 15 Units, 15 Units, Subcutaneous, Daily, Vaughan Basta, MD, 15 Units at 10/23/17 1034 .  iopamidol (ISOVUE-300) 61 % injection 15 mL, 15 mL, Oral, Q1 Hr x 2, Vaughan Basta, MD, 15 mL at 10/23/17 1242 .  ketorolac (TORADOL) 30 MG/ML injection 30 mg, 30 mg, Intravenous, Q6H PRN, Vaughan Basta, MD, 30 mg at 10/22/17 1308 .  lactated ringers infusion, , Intravenous, Continuous, Vanga, Tally Due,  MD, Last Rate: 150 mL/hr at 10/23/17 1241 .  magnesium oxide (MAG-OX) tablet 400 mg, 400 mg, Oral, BID AC & HS, Mody, Sital, MD, 400 mg at 10/23/17 0834 .  metoprolol succinate (TOPROL-XL) 24 hr tablet 50 mg, 50 mg, Oral, Daily, Vaughan Basta, MD, 50 mg at 10/23/17 1033 .  multivitamin with minerals tablet 1 tablet, 1 tablet, Oral, Daily, Mody, Sital, MD, 1 tablet at 10/23/17 1033 .  ondansetron (ZOFRAN) tablet 4 mg, 4 mg, Oral, Q6H PRN **OR** ondansetron (ZOFRAN) injection 4 mg, 4 mg, Intravenous, Q6H PRN, Benjie Karvonen, Sital, MD, 4 mg at 10/21/17 1709 .  oxyCODONE (OXYCONTIN) 12 hr tablet 15 mg, 15 mg, Oral, Q12H, Vaughan Basta, MD, 15 mg at 10/23/17 1033 .  polyethylene glycol (MIRALAX / GLYCOLAX) packet 17 g, 17 g, Oral, BID, Vanga, Tally Due, MD .  promethazine (PHENERGAN) injection 12.5 mg, 12.5 mg, Intravenous, Q6H PRN, Amelia Jo, MD, 12.5 mg at 10/17/17 0513 .  senna-docusate (Senokot-S) tablet 1 tablet, 1 tablet, Oral, BID, Vaughan Basta, MD, 1 tablet at 10/23/17 1033 .  venlafaxine (EFFEXOR) tablet 37.5 mg, 37.5 mg, Oral, Daily, Mody, Sital, MD, 37.5 mg at 10/23/17 1033  Family History  Problem Relation Age of Onset  . Cancer Mother        throat in the 21's  . Hyperlipidemia Mother   . Hypertension Mother   . Hyperlipidemia Father   . Hypertension Father   . Heart disease Father        S/P two stents     Social History   Tobacco Use  . Smoking status: Former Smoker    Types: Cigarettes    Last attempt to quit: 09/03/2014    Years since quitting: 3.1  . Smokeless tobacco: Never Used  Substance Use Topics  . Alcohol use: Yes    Comment: occassional  . Drug use: No    Allergies as of 10/14/2017 - Review Complete 10/14/2017  Allergen Reaction Noted  . Atorvastatin  04/21/2017  . Levofloxacin Hives 02/05/2017  . Morphine Nausea And Vomiting   . Penicillins Rash   . Tramadol hcl Nausea And Vomiting 05/01/2015    Review of Systems:    All  systems reviewed and negative except where noted in HPI.   Physical Exam:  Vital signs in last 24 hours: Temp:  [97.8 F (36.6 C)-99.1 F (37.3 C)] 99.1 F (37.3 C) (09/21 1030) Pulse Rate:  [89-114] 114 (09/21 1030) Resp:  [16-20] 16 (09/21 1030) BP: (128-145)/(79-92) 128/79 (09/21 1030) SpO2:  [95 %-100 %] 95 % (09/21 1030) Last BM Date: 10/22/17 General:   Pleasant, cooperative in NAD Head:  Normocephalic and atraumatic. Eyes:   No icterus.   Conjunctiva pink. PERRLA. Ears:  Normal  auditory acuity. Neck:  Supple; no masses or thyroidomegaly Lungs: Respirations even and unlabored. Lungs clear to auscultation bilaterally.   No wheezes, crackles, or rhonchi.  Heart:  Regular rate and rhythm;  Without murmur, clicks, rubs or gallops Abdomen:  Soft, Moderately distended, tympanic, moderate tenderness in epigastric and LUQ. Normal bowel sounds. No appreciable masses or hepatomegaly.  No rebound or guarding.  Rectal:  Not performed. Msk:  Symmetrical without gross deformities.  Strength normal  Extremities:  Without edema, cyanosis or clubbing. Neurologic:  Alert and oriented x3;  grossly normal neurologically. Skin:  Intact without significant lesions or rashes. Cervical Nodes:  No significant cervical adenopathy. Psych:  Alert and cooperative. Normal affect.  LAB RESULTS: CBC Latest Ref Rng & Units 10/21/2017 10/15/2017 10/14/2017  WBC 3.6 - 11.0 K/uL 8.7 11.2(H) 12.4(H)  Hemoglobin 12.0 - 16.0 g/dL 10.2(L) 13.3 11.9(L)  Hematocrit 35.0 - 47.0 % 29.9(L) 38.7 36.5  Platelets 150 - 440 K/uL 305 321 363    BMET BMP Latest Ref Rng & Units 10/23/2017 10/23/2017 10/21/2017  Glucose 70 - 99 mg/dL 182(H) - 164(H)  BUN 6 - 20 mg/dL 12 - 6  Creatinine 0.44 - 1.00 mg/dL 0.87 - 0.78  BUN/Creat Ratio 9 - 23 - - -  Sodium 135 - 145 mmol/L 135 - 137  Potassium 3.5 - 5.1 mmol/L 3.9 3.9 4.5  Chloride 98 - 111 mmol/L 95(L) - 98  CO2 22 - 32 mmol/L 28 - 30  Calcium 8.9 - 10.3 mg/dL 8.8(L) - 9.1      LFT Hepatic Function Latest Ref Rng & Units 10/23/2017 10/16/2017 10/14/2017  Total Protein 6.5 - 8.1 g/dL 6.4(L) - 7.3  Albumin 3.5 - 5.0 g/dL 2.3(L) 2.8(L) 3.8  AST 15 - 41 U/L 16 - 53(H)  ALT 0 - 44 U/L 14 - 36  Alk Phosphatase 38 - 126 U/L 142(H) - 92  Total Bilirubin 0.3 - 1.2 mg/dL 0.4 - 0.2(L)  Bilirubin, Direct 0.0 - 0.3 mg/dL - - -     STUDIES: No results found.    Impression / Plan:   WILLMA OBANDO is a 47 y.o. Caucasian female with metabolic syndrome, status post cholecystectomy, admitted with acute pancreatitis secondary to hypertriglyceridemia. There is no evidence of pancreatic necrosis or infection. Triglyceride levels are currently <500. There is no evidence of biliary obstruction.   - Nothing by mouth - check serum lipase - X-ray KUB to evaluate for ileus - Lactate of ringer 150 mL per hour - monitor electrolytes and replete as needed, K > 4, Mag > 2 - Do not recommend repeat CT scan today - aggressive bowel regimen to treat constipation - Recommend long-term management of hypertriglyceridemia with  fibrates or statins. She will need follow-up with endocrinology as outpatient - Recommend healthy lifestyle  Thank you for involving me in the care of this patient. Will follow along with you     LOS: 9 days   Sherri Sear, MD  10/23/2017, 1:12 PM   Note: This dictation was prepared with Dragon dictation along with smaller phrase technology. Any transcriptional errors that result from this process are unintentional.

## 2017-10-23 NOTE — Progress Notes (Signed)
[facility] [facility] [place] NAME: Kelly Rollins    MR#:  [id number]  DATE OF BIRTH:  Feb 10, 1970  SUBJECTIVE:   Patient presents to the hospital due to significant abdominal pain nausea and vomiting and noted to have acute pancreatitis.  Patient also noted to be severely hyperlipidemic with severe hypertriglyceridemia. She confirms less pain and nausea now.  Triglyceride level coming down now with insulin drip. Still keep asking for pain meds.  Insuline stopped. Sent to floor. Repeat CT still shows pancreatitis.  pain is slight better, related full liquid diet yesterday. Today morning after having soft diet complain of some worsening in the pain.  I advised to go slow on the diet. Called GI consult today.  REVIEW OF SYSTEMS:    Review of Systems  Constitutional: Negative for chills and fever.  HENT: Negative for congestion and tinnitus.   Eyes: Negative for blurred vision and double vision.  Respiratory: Negative for cough, shortness of breath and wheezing.   Cardiovascular: Negative for chest pain, orthopnea and PND.  Gastrointestinal: Positive for abdominal pain, nausea and vomiting. Negative for diarrhea.  Genitourinary: Negative for dysuria and hematuria.  Neurological: Negative for dizziness, sensory change and focal weakness.  All other systems reviewed and are negative.   Nutrition: NPO Tolerating Diet: Yes Tolerating PT: Ambulatory   DRUG ALLERGIES:   Allergies  Allergen Reactions  . Atorvastatin     Other reaction(s): Muscle Pain  . Levofloxacin Hives  . Morphine Nausea And Vomiting    Other reaction(s): Vomiting REACTION: Headache REACTION: Headache  . Penicillins Rash    Has patient had a PCN reaction causing immediate rash, facial/tongue/throat swelling, SOB or lightheadedness with hypotension: Yes Has patient had a PCN reaction causing severe rash involving mucus membranes or skin necrosis: No Has patient had a PCN  reaction that required hospitalization: No Has patient had a PCN reaction occurring within the last 10 years: No. If all of the above answers are "NO", then may proceed with Cephalosporin use.  . Tramadol Hcl Nausea And Vomiting    Other reaction(s): Vomiting "feels weird"    VITALS:  Blood pressure 128/79, pulse (!) 114, temperature 99.1 F (37.3 C), temperature source Oral, resp. rate 16, height 5\' 3"  (1.6 m), weight 80.4 kg, SpO2 95 %.  PHYSICAL EXAMINATION:   Physical Exam  GENERAL:  47 y.o.-year-old patient lying in bed with acute distress due to pain.  EYES: Pupils equal, round, reactive to light and accommodation. No scleral icterus. Extraocular muscles intact.  HEENT: Head atraumatic, normocephalic. Oropharynx and nasopharynx clear.  NECK:  Supple, no jugular venous distention. No thyroid enlargement, no tenderness.  LUNGS: Normal breath sounds bilaterally, no wheezing, rales, rhonchi. No use of accessory muscles of respiration.  CARDIOVASCULAR: S1, S2 normal. No murmurs, rubs, or gallops.  ABDOMEN: Soft, Tender in Epigastric area, no rebound, rigidity, nondistended. Bowel sounds present. No organomegaly or mass.  EXTREMITIES: No cyanosis, clubbing or edema b/l.    NEUROLOGIC: Cranial nerves II through XII are intact. No focal Motor or sensory deficits b/l.   PSYCHIATRIC: The patient is alert and oriented x 3.  SKIN: No obvious rash, lesion, or ulcer.    LABORATORY PANEL:   CBC Recent Labs  Lab 10/21/17 0442  WBC 8.7  HGB 10.2*  HCT 29.9*  PLT 305   ------------------------------------------------------------------------------------------------------------------  Chemistries  Recent Labs  Lab 10/23/17 0430  NA 135  K 3.9  3.9  CL 95*  CO2 28  GLUCOSE 182*  BUN 12  CREATININE 0.87  CALCIUM 8.8*  MG 2.1  AST 16  ALT 14  ALKPHOS 142*  BILITOT 0.4    ------------------------------------------------------------------------------------------------------------------  Cardiac Enzymes No results for input(s): TROPONINI in the last 168 hours. ------------------------------------------------------------------------------------------------------------------  RADIOLOGY:  No results found.   ASSESSMENT AND PLAN:   47 year old female past medical history of hypertension, diabetes, irritable bowel syndrome, hypertriglyceridemia, who presents to the hospital due to abdominal pain nausea and vomiting and noted to have acute pancreatitis.  1.  Acute pancreatitis-secondary to severe hyperlipidemia/hypertriglyceridemia. - CT scan abdomen pelvis is not suggestive of any pancreatic necrosis.  Continue supportive care with aggressive IV fluid hydration, antiemetics, pain control. Pain is slowly getting better. Started on liquid diet .  Still have pancreatitis on repeat CT 10/20/17 , cont liquid diet, check Lipase.  Added long acting oxycodone for better pain control. Upgrading diet to full liquid now.  Patient is motivated to use oral pain medication and limit the use of IV Dilaudid.  Tried soft diet, but she could not tolrate- called GI consult.  2.  Severe hypertriglyceridemia-patient's triglycerides are over 5000 with total cholesterol being over 1200.  Patient is supposed to be on TriCor but unclear if she is compliant with it. - Keep on IV insulin drip in stepdown level of care.  Once her abdominal pain nausea and vomiting improves we can restart her TriCor.  She is intolerant to statins. Triglycerides gradually improving with insulin drip.  Counseled today again in presence of mother and daughter about diet control.  Stopped insuline drip,  on basal insuline now.  3.  Hyponatremia- likely pseudohyponatremia secondary to some mild hyperglycemia along with dehydration. - We will continue IV fluids, follow sodium. 133- 135 -137 now.  4.   Diabetes type 2 without complication-patient's hemoglobin A1c is as high as 15.  She is only on oral meds, she will likely need to be on insulin prior to discharge.   on the insulin drip given her severe hyper triglyceridemia. -Appreciate diabetes cocordinator input.  blood sugar is < 200 now.  5.  Diabetic neuropathy-continue gabapentin.  6.  Essential hypertension-   Started metoprolol, hydralazine pRN.  7. Depression - cont. Effexor.    All the records are reviewed and case discussed with Care Management/Social Worker. Management plans discussed with the patient, family and they are in agreement.  CODE STATUS: Full code  DVT Prophylaxis: Lovenox  TOTAL TIME TAKING CARE OF THIS PATIENT: 32 minutes.  Discussed with her daughter in the room.  POSSIBLE D/C IN 1-2 DAYS, DEPENDING ON CLINICAL CONDITION.   Vaughan Basta M.D on 10/23/2017 at 3:32 PM  Between 7am to 6pm - Pager - [phone]  After 6pm go to [email or web address] - Proofreader  [facility]  Office  [phone]  CC: Primary care physician; Leonel Ramsay, MD

## 2017-10-23 NOTE — Progress Notes (Signed)
Pharmacy Electrolyte Monitoring Consult:  Pharmacy consulted to assist in monitoring and replacing electrolytes in this 47 y.o. female admitted on 10/14/2017 with pancreatitis. Patient transferred to the ICU for triglycerides over 5000 for management with an insulin drip. Has transitioned off insulin drip and out of ICU.  Labs:  Sodium (mmol/L)  Date Value  10/21/2017 137  05/06/2017 135  03/18/2013 137   Potassium (mmol/L)  Date Value  10/23/2017 3.9  03/18/2013 3.9   Magnesium (mg/dL)  Date Value  10/23/2017 2.1  04/27/2013 1.8   Phosphorus (mg/dL)  Date Value  10/20/2017 4.4   Calcium (mg/dL)  Date Value  10/21/2017 9.1   Calcium, Total (mg/dL)  Date Value  03/18/2013 9.4   Albumin (g/dL)  Date Value  10/16/2017 2.8 (L)  05/06/2017 4.1  03/18/2013 3.9    Assessment/Plan: Off Insulin drip now.  Goal potassium ~4 and magnesium ~ 2. Patient on MagOx 400mg  BID.  9/21 K 3.9  Mag 2.1 No supplementation at this tim Pharmacy to continue to follow patient and replace electrolytes per consult. F/u K on 9/22  Chinita Greenland PharmD Clinical Pharmacist 10/23/2017

## 2017-10-24 DIAGNOSIS — K858 Other acute pancreatitis without necrosis or infection: Principal | ICD-10-CM

## 2017-10-24 LAB — GLUCOSE, CAPILLARY
GLUCOSE-CAPILLARY: 69 mg/dL — AB (ref 70–99)
GLUCOSE-CAPILLARY: 109 mg/dL — AB (ref 70–99)
GLUCOSE-CAPILLARY: 92 mg/dL (ref 70–99)
GLUCOSE-CAPILLARY: 111 mg/dL — AB (ref 70–99)
Glucose-Capillary: 160 mg/dL — ABNORMAL HIGH (ref 70–99)

## 2017-10-24 LAB — POTASSIUM: Potassium: 3.7 mmol/L (ref 3.5–5.1)

## 2017-10-24 MED ORDER — INSULIN DETEMIR 100 UNIT/ML ~~LOC~~ SOLN
10.0000 [IU] | Freq: Every day | SUBCUTANEOUS | Status: DC
  Administered 2017-10-24 – 2017-10-27 (×4): 10 [IU] via SUBCUTANEOUS
  Filled 2017-10-24 (×5): qty 0.1

## 2017-10-24 NOTE — Progress Notes (Signed)
Pharmacy Electrolyte Monitoring Consult:  Pharmacy consulted to assist in monitoring and replacing electrolytes in this 47 y.o. female admitted on 10/14/2017 with pancreatitis. Patient transferred to the ICU for triglycerides over 5000 for management with an insulin drip. Has transitioned off insulin drip and out of ICU.  Labs:  Sodium (mmol/L)  Date Value  10/23/2017 135  05/06/2017 135  03/18/2013 137   Potassium (mmol/L)  Date Value  10/24/2017 3.7  03/18/2013 3.9   Magnesium (mg/dL)  Date Value  10/23/2017 2.1  04/27/2013 1.8   Phosphorus (mg/dL)  Date Value  10/20/2017 4.4   Calcium (mg/dL)  Date Value  10/23/2017 8.8 (L)   Calcium, Total (mg/dL)  Date Value  03/18/2013 9.4   Albumin (g/dL)  Date Value  10/23/2017 2.3 (L)  05/06/2017 4.1  03/18/2013 3.9    Assessment/Plan: Off Insulin drip now.  Goal potassium ~4 and magnesium ~ 2. Patient on MagOx 400mg  BID.  9/21 K 3.7 No supplementation at this time- K is trending down. Pharmacy to continue to follow patient and replace electrolytes per consult. F/u K on 9/23  Chinita Greenland PharmD Clinical Pharmacist 10/24/2017

## 2017-10-24 NOTE — Progress Notes (Signed)
[name], MD [address]  [place]  [place], [place] [address]  Main: [phone]  Fax: [phone] Pager: [phone]   Subjective: She felt great yesterday after nothing by mouth. Pain was significantly better. She started clear liquids today and reports upper abdominal discomfort. X-ray KUB did not reveal ileus. She has been having soft bowel movements on MiraLAX and Colace. She denies nausea or vomiting   Objective: Vital signs in last 24 hours: Vitals:   10/23/17 1952 10/24/17 0420 10/24/17 1000 10/24/17 1403  BP: (!) 142/82 126/76 138/77 135/76  Pulse: 94 96 96 89  Resp: 19 19 18 18   Temp: 98.2 F (36.8 C) 98 F (36.7 C) 98.3 F (36.8 C) 97.9 F (36.6 C)  TempSrc: Oral Oral Oral Oral  SpO2: 100% 97% 98% 95%  Weight:      Height:       Weight change:   Intake/Output Summary (Last 24 hours) at 10/24/2017 1633 Last data filed at 10/24/2017 1011 Gross per 24 hour  Intake 2767.05 ml  Output -  Net 2767.05 ml     Exam: Heart:: Regular rate and rhythm or S1S2 present Lungs: normal and clear to auscultation Abdomen: soft, mild tenderness in right upper quadrant, normal bowel sounds   Lab Results: CBC Latest Ref Rng & Units 10/21/2017 10/15/2017 10/14/2017  WBC 3.6 - 11.0 K/uL 8.7 11.2(H) 12.4(H)  Hemoglobin 12.0 - 16.0 g/dL 10.2(L) 13.3 11.9(L)  Hematocrit 35.0 - 47.0 % 29.9(L) 38.7 36.5  Platelets 150 - 440 K/uL 305 321 363   Micro Results: Recent Results (from the past 240 hour(s))  MRSA PCR Screening     Status: None   Collection Time: 10/15/17  3:56 PM  Result Value Ref Range Status   MRSA by PCR NEGATIVE NEGATIVE Final    Comment:        The GeneXpert MRSA Assay (FDA approved for NASAL specimens only), is one component of a comprehensive MRSA colonization surveillance program. It is not intended to diagnose MRSA infection nor to guide or monitor treatment for MRSA infections. Performed at [facility], [address] [place], [place] [address]   C difficile quick scan w PCR reflex     Status: None   Collection Time: 10/18/17  7:00 AM  Result Value Ref Range Status   C Diff antigen NEGATIVE NEGATIVE Final   C Diff toxin NEGATIVE NEGATIVE Final   C Diff interpretation No C. difficile detected.  Final    Comment: Performed at [facility], [address] [place], [place] [address]   Studies/Results: Dg Abd 1 View  Result Date: 10/23/2017 CLINICAL DATA:  PANCREATITIS, ABD DISTENTION, LAST BM 2 WEEKS AGO, PT DRANK 1 CUP OF CT CONTRAST BEFORE CT WAS CANCELED EXAM: ABDOMEN - 1 VIEW COMPARISON:  CT, 10/20/2017 FINDINGS: Normal bowel gas pattern. No evidence of renal or ureteral stones. Small density in the right lower quadrant which likely reflects some residual contrast in the cecum. Soft tissues otherwise unremarkable. No significant skeletal abnormality. IMPRESSION: No acute findings. No evidence of bowel obstruction. No bowel dilation to suggest diffuse adynamic ileus. Electronically Signed   By: Lajean Manes M.D.   On: 10/23/2017 15:36   Medications: I have reviewed the patient's current medications. Scheduled Meds: . calcium citrate-vitamin D  2 tablet Oral Q breakfast  . cholecalciferol  5,000 Units Oral Q breakfast  . enoxaparin (LOVENOX) injection  40 mg Subcutaneous Q24H  . famotidine  20 mg Oral  BID  . gabapentin  300 mg Oral QHS  . Influenza vac split quadrivalent PF  0.5 mL Intramuscular Tomorrow-1000  . insulin aspart  0-15 Units Subcutaneous TID WC  . insulin aspart  0-5 Units Subcutaneous QHS  . insulin detemir  10 Units Subcutaneous Daily  . magnesium oxide  400 mg Oral BID AC & HS  . metoprolol succinate  50 mg Oral Daily  . multivitamin with minerals  1 tablet Oral Daily  . oxyCODONE  15 mg Oral Q12H  . polyethylene glycol  17 g Oral BID  . senna-docusate  1 tablet Oral BID  . venlafaxine  37.5 mg Oral Daily   Continuous Infusions: . lactated ringers 150 mL/hr at 10/24/17  1152   PRN Meds:.acetaminophen **OR** acetaminophen, alum & mag hydroxide-simeth, aspirin-acetaminophen-caffeine, bisacodyl, cyclobenzaprine, dextrose, diphenhydrAMINE, hydrALAZINE, HYDROcodone-acetaminophen, HYDROmorphone (DILAUDID) injection, ketorolac, ondansetron **OR** ondansetron (ZOFRAN) IV, promethazine   Assessment: Active Problems:   Pancreatitis acute pancreatitis secondary to hypertriglyceridemia  Plan: Continue IV fluids at current rate for today, decrease the rate of LR to 172ml/hr from tomorrow Continue clear liquid diet only for next 1-2 days Pain management encourage activity as tolerated  Dr. Vicente Males to cover from tomorrow   LOS: 10 days   [name] 10/24/2017, 4:33 PM

## 2017-10-24 NOTE — Progress Notes (Signed)
[place] [facility] [place] NAME: Kelly Rollins    MR#:  [id number]  DATE OF BIRTH:  [birth year]  SUBJECTIVE:   Patient presents to the hospital due to significant abdominal pain nausea and vomiting and noted to have acute pancreatitis.  Patient also noted to be severely hyperlipidemic with severe hypertriglyceridemia. She confirms less pain and nausea now.  Triglyceride level coming down now with insulin drip. Still keep asking for pain meds.  Insuline stopped. Sent to floor. Repeat CT still shows pancreatitis.  pain is slight better, related full liquid diet yesterday. Today morning after having soft diet complain of some worsening in the pain.  I advised to go slow on the diet. Called GI consult, No Ileus on Xray, Re-started on IV fluids, Pain much improved, but some discomfort after liquid diet .  REVIEW OF SYSTEMS:    Review of Systems  Constitutional: Negative for chills and fever.  HENT: Negative for congestion and tinnitus.   Eyes: Negative for blurred vision and double vision.  Respiratory: Negative for cough, shortness of breath and wheezing.   Cardiovascular: Negative for chest pain, orthopnea and PND.  Gastrointestinal: Positive for abdominal pain, nausea and vomiting. Negative for diarrhea.  Genitourinary: Negative for dysuria and hematuria.  Neurological: Negative for dizziness, sensory change and focal weakness.  All other systems reviewed and are negative.   Nutrition: NPO Tolerating Diet: Yes Tolerating PT: Ambulatory   DRUG ALLERGIES:   Allergies  Allergen Reactions  . Atorvastatin     Other reaction(s): Muscle Pain  . Levofloxacin Hives  . Morphine Nausea And Vomiting    Other reaction(s): Vomiting REACTION: Headache REACTION: Headache  . Penicillins Rash    Has patient had a PCN reaction causing immediate rash, facial/tongue/throat swelling, SOB or lightheadedness with hypotension: Yes Has patient had a PCN reaction  causing severe rash involving mucus membranes or skin necrosis: No Has patient had a PCN reaction that required hospitalization: No Has patient had a PCN reaction occurring within the last 10 years: No. If all of the above answers are "NO", then may proceed with Cephalosporin use.  . Tramadol Hcl Nausea And Vomiting    Other reaction(s): Vomiting "feels weird"    VITALS:  Blood pressure 135/76, pulse 89, temperature 97.9 F (36.6 C), temperature source Oral, resp. rate 18, height 5\' 3"  (1.6 m), weight 80.4 kg, SpO2 95 %.  PHYSICAL EXAMINATION:   Physical Exam  GENERAL:  47 y.o.-year-old patient lying in bed with acute distress due to pain.  EYES: Pupils equal, round, reactive to light and accommodation. No scleral icterus. Extraocular muscles intact.  HEENT: Head atraumatic, normocephalic. Oropharynx and nasopharynx clear.  NECK:  Supple, no jugular venous distention. No thyroid enlargement, no tenderness.  LUNGS: Normal breath sounds bilaterally, no wheezing, rales, rhonchi. No use of accessory muscles of respiration.  CARDIOVASCULAR: S1, S2 normal. No murmurs, rubs, or gallops.  ABDOMEN: Soft, Tender in Epigastric area, no rebound, rigidity, nondistended. Bowel sounds present. No organomegaly or mass.  EXTREMITIES: No cyanosis, clubbing or edema b/l.    NEUROLOGIC: Cranial nerves II through XII are intact. No focal Motor or sensory deficits b/l.   PSYCHIATRIC: The patient is alert and oriented x 3.  SKIN: No obvious rash, lesion, or ulcer.    LABORATORY PANEL:   CBC Recent Labs  Lab 10/21/17 0442  WBC 8.7  HGB 10.2*  HCT 29.9*  PLT 305   ------------------------------------------------------------------------------------------------------------------  Chemistries  Recent Labs  Lab 10/23/17  0430 10/24/17 0449  NA 135  --   K 3.9  3.9 3.7  CL 95*  --   CO2 28  --   GLUCOSE 182*  --   BUN 12  --   CREATININE 0.87  --   CALCIUM 8.8*  --   MG 2.1  --   AST 16   --   ALT 14  --   ALKPHOS 142*  --   BILITOT 0.4  --    ------------------------------------------------------------------------------------------------------------------  Cardiac Enzymes No results for input(s): TROPONINI in the last 168 hours. ------------------------------------------------------------------------------------------------------------------  RADIOLOGY:  Dg Abd 1 View  Result Date: 10/23/2017 CLINICAL DATA:  PANCREATITIS, ABD DISTENTION, LAST BM 2 WEEKS AGO, PT DRANK 1 CUP OF CT CONTRAST BEFORE CT WAS CANCELED EXAM: ABDOMEN - 1 VIEW COMPARISON:  CT, 10/20/2017 FINDINGS: Normal bowel gas pattern. No evidence of renal or ureteral stones. Small density in the right lower quadrant which likely reflects some residual contrast in the cecum. Soft tissues otherwise unremarkable. No significant skeletal abnormality. IMPRESSION: No acute findings. No evidence of bowel obstruction. No bowel dilation to suggest diffuse adynamic ileus. Electronically Signed   By: Lajean Manes M.D.   On: 10/23/2017 15:36     ASSESSMENT AND PLAN:   47 year old female past medical history of hypertension, diabetes, irritable bowel syndrome, hypertriglyceridemia, who presents to the hospital due to abdominal pain nausea and vomiting and noted to have acute pancreatitis.  1.  Acute pancreatitis-secondary to severe hyperlipidemia/hypertriglyceridemia. - CT scan abdomen pelvis is not suggestive of any pancreatic necrosis.  Continue supportive care with aggressive IV fluid hydration, antiemetics, pain control. Pain is slowly getting better. Started on liquid diet .  Still have pancreatitis on repeat CT 10/20/17 , cont liquid diet, check Lipase.  Added long acting oxycodone for better pain control. Upgrading diet to full liquid now.  Patient is motivated to use oral pain medication and limit the use of IV Dilaudid.  Tried soft diet, but she could not tolrate- called GI consult.  Suggested to keep on IV  fluids and dial back to clear liquids.  2.  Severe hypertriglyceridemia-patient's triglycerides are over 5000 with total cholesterol being over 1200.  Patient is supposed to be on TriCor but unclear if she is compliant with it. - Keep on IV insulin drip in stepdown level of care.  Once her abdominal pain nausea and vomiting improves we can restart her TriCor.  She is intolerant to statins. Triglycerides gradually improving with insulin drip.  Counseled today again in presence of mother and daughter about diet control.  Stopped insuline drip,  on basal insuline now.  3.  Hyponatremia- likely pseudohyponatremia secondary to some mild hyperglycemia along with dehydration. - We will continue IV fluids, follow sodium. 133- 135 -137 now.  4.  Diabetes type 2 without complication-patient's hemoglobin A1c is as high as 15.  She is only on oral meds, she will likely need to be on insulin prior to discharge.   on the insulin drip given her severe hyper triglyceridemia. -Appreciate diabetes cocordinator input.  blood sugar is < 200 now.  5.  Diabetic neuropathy-continue gabapentin.  6.  Essential hypertension-   Started metoprolol, hydralazine pRN.  7. Depression - cont. Effexor.    All the records are reviewed and case discussed with Care Management/Social Worker. Management plans discussed with the patient, family and they are in agreement.  CODE STATUS: Full code  DVT Prophylaxis: Lovenox  TOTAL TIME TAKING CARE OF THIS PATIENT: 32 minutes.  Discussed with her daughter in the room.  POSSIBLE D/C IN 1-2 DAYS, DEPENDING ON CLINICAL CONDITION.   Vaughan Basta M.D on 10/24/2017 at 5:37 PM  Between 7am to 6pm - Pager - [phone]  After 6pm go to [email or web address] - Proofreader  [facility]  Office  [phone]  CC: Primary care physician; Leonel Ramsay, MD

## 2017-10-25 DIAGNOSIS — R109 Unspecified abdominal pain: Secondary | ICD-10-CM

## 2017-10-25 LAB — BASIC METABOLIC PANEL
Anion gap: 5 (ref 5–15)
BUN: 7 mg/dL (ref 6–20)
CHLORIDE: 102 mmol/L (ref 98–111)
CO2: 31 mmol/L (ref 22–32)
Calcium: 8.6 mg/dL — ABNORMAL LOW (ref 8.9–10.3)
Creatinine, Ser: 0.61 mg/dL (ref 0.44–1.00)
GFR calc Af Amer: >60 mL/min (ref >60)
GFR calc non Af Amer: >60 mL/min (ref >60)
GLUCOSE: 102 mg/dL — AB (ref 70–99)
Potassium: 4.3 mmol/L (ref 3.5–5.1)
SODIUM: 138 mmol/L (ref 135–145)

## 2017-10-25 LAB — TSH: TSH: 4.3 u[IU]/mL (ref 0.350–4.500)

## 2017-10-25 LAB — CBC
HCT: 24.1 % — ABNORMAL LOW (ref 35.0–47.0)
HEMOGLOBIN: 8.5 g/dL — AB (ref 12.0–16.0)
MCH: 30.2 pg (ref 26.0–34.0)
MCHC: 35.3 g/dL (ref 32.0–36.0)
MCV: 85.6 fL (ref 80.0–100.0)
Platelets: 422 10*3/uL (ref 150–440)
RBC: 2.82 MIL/uL — AB (ref 3.80–5.20)
RDW: 14.9 % — ABNORMAL HIGH (ref 11.5–14.5)
WBC: 10.4 10*3/uL (ref 3.6–11.0)

## 2017-10-25 LAB — GLUCOSE, CAPILLARY
GLUCOSE-CAPILLARY: 82 mg/dL (ref 70–99)
Glucose-Capillary: 114 mg/dL — ABNORMAL HIGH (ref 70–99)
Glucose-Capillary: 161 mg/dL — ABNORMAL HIGH (ref 70–99)
Glucose-Capillary: 73 mg/dL (ref 70–99)
Glucose-Capillary: 97 mg/dL (ref 70–99)

## 2017-10-25 MED ORDER — KETOROLAC TROMETHAMINE 15 MG/ML IJ SOLN
15.0000 mg | Freq: Four times a day (QID) | INTRAMUSCULAR | Status: DC | PRN
  Filled 2017-10-25 (×2): qty 1

## 2017-10-25 MED ORDER — SODIUM CHLORIDE 0.9 % IV SOLN
INTRAVENOUS | Status: DC
  Administered 2017-10-25 – 2017-10-26 (×6): via INTRAVENOUS

## 2017-10-25 MED ORDER — HYDROMORPHONE HCL 1 MG/ML IJ SOLN
0.5000 mg | INTRAMUSCULAR | Status: DC | PRN
  Administered 2017-10-25 – 2017-10-26 (×6): 0.5 mg via INTRAVENOUS
  Filled 2017-10-25 (×6): qty 1

## 2017-10-25 NOTE — Progress Notes (Signed)
Jonathon Bellows , MD [address] [place], [place], [place], [address] [address], [place], [place], [place], [address] Phone: [phone]  Fax: [phone]   Kelly Rollins is being followed for pancreatitis secondary to hypertriglyceridemia  Subjective:  Pain has returned today , no able to eat or drink much   Objective: Vital signs in last 24 hours: Vitals:   10/24/17 1000 10/24/17 1403 10/24/17 1941 10/25/17 0402  BP: 138/77 135/76 (!) 143/84 131/75  Pulse: 96 89 99 81  Resp: 18 18 19 16   Temp: 98.3 F (36.8 C) 97.9 F (36.6 C) 98.1 F (36.7 C) 98.7 F (37.1 C)  TempSrc: Oral Oral Oral   SpO2: 98% 95% 93% 99%  Weight:      Height:       Weight change:   Intake/Output Summary (Last 24 hours) at 10/25/2017 7353 Last data filed at 10/25/2017 0500 Gross per 24 hour  Intake 3214.99 ml  Output -  Net 3214.99 ml     Exam: Heart:: Regular rate and rhythm, S1S2 present or without murmur or extra heart sounds Lungs: normal, clear to auscultation and clear to auscultation and percussion Abdomen: soft, nontender, normal bowel sounds   Lab Results: @LABTEST2 @ Micro Results: Recent Results (from the past 240 hour(s))  MRSA PCR Screening     Status: None   Collection Time: 10/15/17  3:56 PM  Result Value Ref Range Status   MRSA by PCR NEGATIVE NEGATIVE Final    Comment:        The GeneXpert MRSA Assay (FDA approved for NASAL specimens only), is one component of a comprehensive MRSA colonization surveillance program. It is not intended to diagnose MRSA infection nor to guide or monitor treatment for MRSA infections. Performed at [facility], [address] [place], [place] [address]   C difficile quick scan w PCR reflex     Status: None   Collection Time: 10/18/17  7:00 AM  Result Value Ref Range Status   C Diff antigen NEGATIVE NEGATIVE Final   C Diff toxin NEGATIVE NEGATIVE Final   C Diff interpretation No C. difficile detected.   Final    Comment: Performed at [facility], [address] [place], [place] [address]   Studies/Results: Dg Abd 1 View  Result Date: 10/23/2017 CLINICAL DATA:  PANCREATITIS, ABD DISTENTION, LAST BM 2 WEEKS AGO, PT DRANK 1 CUP OF CT CONTRAST BEFORE CT WAS CANCELED EXAM: ABDOMEN - 1 VIEW COMPARISON:  CT, 10/20/2017 FINDINGS: Normal bowel gas pattern. No evidence of renal or ureteral stones. Small density in the right lower quadrant which likely reflects some residual contrast in the cecum. Soft tissues otherwise unremarkable. No significant skeletal abnormality. IMPRESSION: No acute findings. No evidence of bowel obstruction. No bowel dilation to suggest diffuse adynamic ileus. Electronically Signed   By: Lajean Manes M.D.   On: 10/23/2017 15:36   Medications: I have reviewed the patient's current medications. Scheduled Meds: . calcium citrate-vitamin D  2 tablet Oral Q breakfast  . cholecalciferol  5,000 Units Oral Q breakfast  . enoxaparin (LOVENOX) injection  40 mg Subcutaneous Q24H  . famotidine  20 mg Oral BID  . gabapentin  300 mg Oral QHS  . Influenza vac split quadrivalent PF  0.5 mL Intramuscular Tomorrow-1000  . insulin aspart  0-15 Units Subcutaneous TID WC  . insulin aspart  0-5 Units Subcutaneous QHS  . insulin detemir  10 Units Subcutaneous Daily  . magnesium oxide  400 mg Oral  BID AC & HS  . metoprolol succinate  50 mg Oral Daily  . multivitamin with minerals  1 tablet Oral Daily  . oxyCODONE  15 mg Oral Q12H  . polyethylene glycol  17 g Oral BID  . senna-docusate  1 tablet Oral BID  . venlafaxine  37.5 mg Oral Daily   Continuous Infusions: . lactated ringers 150 mL/hr at 10/25/17 0201   PRN Meds:.acetaminophen **OR** acetaminophen, alum & mag hydroxide-simeth, aspirin-acetaminophen-caffeine, bisacodyl, cyclobenzaprine, dextrose, diphenhydrAMINE, hydrALAZINE, HYDROcodone-acetaminophen, HYDROmorphone (DILAUDID) injection, ketorolac, ondansetron **OR**  ondansetron (ZOFRAN) IV, promethazine   Assessment: Active Problems:   Pancreatitis  Kelly Rollins 47 y.o. female with a known history of pancreatitis secondary to hypertriglyceridemia admitted with acute pancreatitis.  Elevated triglyceride levels over 5000.  Admitted on 10/14/2017.  CT scan of the abdomen on 10/20/2017 shows acute pancreatitis without any drainable fluid.  Secondary ascitis along the duodenum pericolic gutter and right pelvis.  Status post cholecystectomy, no intrahepatic or extrahepatic ductal dilation.  CMP on 10/23/2017 shows normal transaminases with a total bilirubin of 0.4 and an alkaline phosphatase of 142.Mother has had pancreatitis, H/o elevated TGL evebn 10 years back. Today pain is worse than yesterday   Plan: 1.  Check TSH. 2.  She will need medications to lower her triglyceride levels and prevent such an episode from occurring.  Tight glycemic control as hypertriglyceridemia may be secondary to poorly controlled diabetes. 3. Advance diet gradually as tolerated.  4. Continue IV fluids till she drinks adequately-will inform Dr Marthann Schiller Continue analgesia. 5. Drop in Hb - having normal bowel movements-recheck Hb tomorrow.  If has further drop in Hb may need abdominal imaging as rarely pancreatitis can cause damage to the splenic vessels and lead to retroperitoneal bleeds.    LOS: 11 days   Jonathon Bellows, MD 10/25/2017, 8:49 AM

## 2017-10-25 NOTE — Progress Notes (Signed)
[facility] [place] NAME: Kelly Rollins    MR#:  [id number]  DATE OF BIRTH:  1970/09/18  SUBJECTIVE:   Patient presents to the hospital due to significant abdominal pain nausea and vomiting and noted to have acute pancreatitis.  Patient also noted to be severely hyperlipidemic with severe hypertriglyceridemia. She confirms less pain and nausea now.  Triglyceride level coming down now with insulin drip. Still keep asking for pain meds.  Insuline stopped. Sent to floor. Repeat CT still shows pancreatitis.  pain is slight better, related full liquid diet yesterday. Today morning after having soft diet complain of some worsening in the pain.  I advised to go slow on the diet. Called GI consult, No Ileus on Xray, Re-started on IV fluids, Pain much improved, but some discomfort after liquid diet .  REVIEW OF SYSTEMS:    Review of Systems  Constitutional: Negative for chills and fever.  HENT: Negative for congestion and tinnitus.   Eyes: Negative for blurred vision and double vision.  Respiratory: Negative for cough, shortness of breath and wheezing.   Cardiovascular: Negative for chest pain, orthopnea and PND.  Gastrointestinal: Positive for abdominal pain, nausea and vomiting. Negative for diarrhea.  Genitourinary: Negative for dysuria and hematuria.  Neurological: Negative for dizziness, sensory change and focal weakness.  All other systems reviewed and are negative.   Nutrition: liquid diet. Tolerating Diet: Yes Tolerating PT: Ambulatory   DRUG ALLERGIES:   Allergies  Allergen Reactions  . Atorvastatin     Other reaction(s): Muscle Pain  . Levofloxacin Hives  . Morphine Nausea And Vomiting    Other reaction(s): Vomiting REACTION: Headache REACTION: Headache  . Penicillins Rash    Has patient had a PCN reaction causing immediate rash, facial/tongue/throat swelling, SOB or lightheadedness with hypotension: Yes Has patient had a PCN  reaction causing severe rash involving mucus membranes or skin necrosis: No Has patient had a PCN reaction that required hospitalization: No Has patient had a PCN reaction occurring within the last 10 years: No. If all of the above answers are "NO", then may proceed with Cephalosporin use.  . Tramadol Hcl Nausea And Vomiting    Other reaction(s): Vomiting "feels weird"    VITALS:  Blood pressure (!) 154/83, pulse (!) 103, temperature 98.7 F (37.1 C), resp. rate 16, height 5\' 3"  (1.6 m), weight 80.4 kg, SpO2 99 %.  PHYSICAL EXAMINATION:   Physical Exam  GENERAL:  48 y.o.-year-old patient lying in bed with acute distress due to pain.  EYES: Pupils equal, round, reactive to light and accommodation. No scleral icterus. Extraocular muscles intact.  HEENT: Head atraumatic, normocephalic. Oropharynx and nasopharynx clear.  NECK:  Supple, no jugular venous distention. No thyroid enlargement, no tenderness.  LUNGS: Normal breath sounds bilaterally, no wheezing, rales, rhonchi. No use of accessory muscles of respiration.  CARDIOVASCULAR: S1, S2 normal. No murmurs, rubs, or gallops.  ABDOMEN: Soft, Tender in Epigastric area, no rebound, rigidity, nondistended. Bowel sounds present. No organomegaly or mass.  EXTREMITIES: No cyanosis, clubbing or edema b/l.    NEUROLOGIC: Cranial nerves II through XII are intact. No focal Motor or sensory deficits b/l.   PSYCHIATRIC: The patient is alert and oriented x 3.  SKIN: No obvious rash, lesion, or ulcer.    LABORATORY PANEL:   CBC Recent Labs  Lab 10/25/17 0417  WBC 10.4  HGB 8.5*  HCT 24.1*  PLT 422   ------------------------------------------------------------------------------------------------------------------  Chemistries  Recent Labs  Lab 10/23/17  0430  10/25/17 0417  NA 135  --  138  K 3.9  3.9   < > 4.3  CL 95*  --  102  CO2 28  --  31  GLUCOSE 182*  --  102*  BUN 12  --  7  CREATININE 0.87  --  0.61  CALCIUM 8.8*  --   8.6*  MG 2.1  --   --   AST 16  --   --   ALT 14  --   --   ALKPHOS 142*  --   --   BILITOT 0.4  --   --    < > = values in this interval not displayed.   ------------------------------------------------------------------------------------------------------------------  Cardiac Enzymes No results for input(s): TROPONINI in the last 168 hours. ------------------------------------------------------------------------------------------------------------------  RADIOLOGY:  No results found.   ASSESSMENT AND PLAN:   47 year old female past medical history of hypertension, diabetes, irritable bowel syndrome, hypertriglyceridemia, who presents to the hospital due to abdominal pain nausea and vomiting and noted to have acute pancreatitis.  1.  Acute pancreatitis-secondary to severe hyperlipidemia/hypertriglyceridemia. - CT scan abdomen pelvis is not suggestive of any pancreatic necrosis.  Continue supportive care with aggressive IV fluid hydration, antiemetics, pain control. Pain is slowly getting better. Started on liquid diet .  Still have pancreatitis on repeat CT 10/20/17 , cont liquid diet, check Lipase.  Added long acting oxycodone for better pain control. Upgrading diet to full liquid now.  Patient is motivated to use oral pain medication and limit the use of IV Dilaudid.  Tried soft diet, but she could not tolrate- called GI consult.  Suggested to keep on IV fluids and dial back to clear liquids.  2.  Severe hypertriglyceridemia-patient's triglycerides are over 5000 with total cholesterol being over 1200.  Patient is supposed to be on TriCor but unclear if she is compliant with it. - Keep on IV insulin drip in stepdown level of care.  Once her abdominal pain nausea and vomiting improves we can restart her TriCor.  She is intolerant to statins. Triglycerides gradually improving with insulin drip.  Counseled today again in presence of mother and daughter about diet control.  Stopped  insuline drip,  on basal insuline now.  3.  Hyponatremia- likely pseudohyponatremia secondary to some mild hyperglycemia along with dehydration. - We will continue IV fluids, follow sodium. 133- 135 -137 now.  4.  Diabetes type 2 without complication-patient's hemoglobin A1c is as high as 15.  She is only on oral meds, she will likely need to be on insulin prior to discharge.   on the insulin drip given her severe hyper triglyceridemia. -Appreciate diabetes cocordinator input.  blood sugar is < 200 now.  5.  Diabetic neuropathy-continue gabapentin.  6.  Essential hypertension-   Started metoprolol, hydralazine pRN.  7. Depression - cont. Effexor.    All the records are reviewed and case discussed with Care Management/Social Worker. Management plans discussed with the patient, family and they are in agreement.  CODE STATUS: Full code  DVT Prophylaxis: Lovenox  TOTAL TIME TAKING CARE OF THIS PATIENT: 32 minutes.  Discussed with her husband in the room.  POSSIBLE D/C IN 1-2 DAYS, DEPENDING ON CLINICAL CONDITION.   Vaughan Basta M.D on 10/25/2017 at 3:55 PM  Between 7am to 6pm - Pager - [phone]  After 6pm go to [email or web address] - Proofreader  [facility]  Office  [phone]  CC: Primary care physician; Leonel Ramsay, MD

## 2017-10-25 NOTE — Care Management (Addendum)
Barrier to discharge is pain. Patient on clear liquids and IVF.  Labs ordered for 9.24.  Investigating Repatha.  Patient has not insurance and would have to apply  Through [employer]. Patient has not started completing the [facility] applications as discussed while she was in icu so CM could fax to [place]

## 2017-10-25 NOTE — Progress Notes (Signed)
Pt stated pain was 7/10 after giving Norco. Will continue to access.

## 2017-10-25 NOTE — Progress Notes (Signed)
Pharmacy Electrolyte Monitoring Consult:  Pharmacy consulted to assist in monitoring and replacing electrolytes in this 47 y.o. female admitted on 10/14/2017 with pancreatitis. Patient transferred to the ICU for triglycerides over 5000 for management with an insulin drip. Has transitioned off insulin drip and out of ICU.  Labs:  Sodium (mmol/L)  Date Value  10/25/2017 138  05/06/2017 135  03/18/2013 137   Potassium (mmol/L)  Date Value  10/25/2017 4.3  03/18/2013 3.9   Magnesium (mg/dL)  Date Value  10/23/2017 2.1  04/27/2013 1.8   Phosphorus (mg/dL)  Date Value  10/20/2017 4.4   Calcium (mg/dL)  Date Value  10/25/2017 8.6 (L)   Calcium, Total (mg/dL)  Date Value  03/18/2013 9.4   Albumin (g/dL)  Date Value  10/23/2017 2.3 (L)  05/06/2017 4.1  03/18/2013 3.9    Assessment/Plan: Off Insulin drip now.  Goal potassium ~4 and magnesium ~ 2. Patient on MagOx 400mg  BID.  9/23 K 4.3 No supplementation at this time, has not required supp at least x2 days Will sign off on this consult at this time.   Rayna Sexton, PharmD, BCPS Clinical Pharmacist 10/25/2017 12:47 PM

## 2017-10-26 LAB — LIPID PANEL
CHOL/HDL RATIO: 14.2 ratio
CHOLESTEROL: 227 mg/dL — AB (ref 0–200)
HDL: 16 mg/dL — AB (ref >40)
LDL Cholesterol: 150 mg/dL — ABNORMAL HIGH (ref 0–99)
TRIGLYCERIDES: 306 mg/dL — AB (ref <150)
VLDL: 61 mg/dL — ABNORMAL HIGH (ref 0–40)

## 2017-10-26 LAB — GLUCOSE, CAPILLARY
GLUCOSE-CAPILLARY: 125 mg/dL — AB (ref 70–99)
Glucose-Capillary: 100 mg/dL — ABNORMAL HIGH (ref 70–99)
Glucose-Capillary: 84 mg/dL (ref 70–99)
Glucose-Capillary: 105 mg/dL — ABNORMAL HIGH (ref 70–99)

## 2017-10-26 LAB — CBC
HCT: 24.9 % — ABNORMAL LOW (ref 35.0–47.0)
HEMOGLOBIN: 8.8 g/dL — AB (ref 12.0–16.0)
MCH: 30.2 pg (ref 26.0–34.0)
MCHC: 35.2 g/dL (ref 32.0–36.0)
MCV: 85.8 fL (ref 80.0–100.0)
Platelets: 514 10*3/uL — ABNORMAL HIGH (ref 150–440)
RBC: 2.9 MIL/uL — AB (ref 3.80–5.20)
RDW: 14.8 % — ABNORMAL HIGH (ref 11.5–14.5)
WBC: 10.3 10*3/uL (ref 3.6–11.0)

## 2017-10-26 MED ORDER — VENLAFAXINE HCL ER 37.5 MG PO CP24
37.5000 mg | ORAL_CAPSULE | Freq: Every day | ORAL | Status: DC
  Administered 2017-10-27: 37.5 mg via ORAL
  Filled 2017-10-26: qty 1

## 2017-10-26 MED ORDER — FENOFIBRATE 160 MG PO TABS
160.0000 mg | ORAL_TABLET | Freq: Every day | ORAL | Status: DC
  Administered 2017-10-26 – 2017-10-27 (×2): 160 mg via ORAL
  Filled 2017-10-26 (×2): qty 1

## 2017-10-26 NOTE — Progress Notes (Signed)
[name] [place] NAME: Kelly Rollins    MR#:  [id number]  DATE OF BIRTH:  12/25/1970  SUBJECTIVE:   Patient presents to the hospital due to significant abdominal pain nausea and vomiting and noted to have acute pancreatitis.  Patient also noted to be severely hyperlipidemic with severe hypertriglyceridemia. She confirms less pain and nausea now.  Triglyceride level coming down now with insulin drip. Still keep asking for pain meds.  Insuline stopped. Sent to floor. Repeat CT still shows pancreatitis.  pain is slight better, related full liquid diet yesterday. Today morning after having soft diet complain of some worsening in the pain.  I advised to go slow on the diet. Called GI consult, No Ileus on Xray, Re-started on IV fluids, Pain much improved, but some discomfort after liquid diet . Had a bad day yesterday, feeling slightly better today.  REVIEW OF SYSTEMS:    Review of Systems  Constitutional: Negative for chills and fever.  HENT: Negative for congestion and tinnitus.   Eyes: Negative for blurred vision and double vision.  Respiratory: Negative for cough, shortness of breath and wheezing.   Cardiovascular: Negative for chest pain, orthopnea and PND.  Gastrointestinal: Positive for abdominal pain, nausea and vomiting. Negative for diarrhea.  Genitourinary: Negative for dysuria and hematuria.  Neurological: Negative for dizziness, sensory change and focal weakness.  All other systems reviewed and are negative.   Nutrition: liquid diet. Tolerating Diet: Yes Tolerating PT: Ambulatory   DRUG ALLERGIES:   Allergies  Allergen Reactions  . Atorvastatin     Other reaction(s): Muscle Pain  . Levofloxacin Hives  . Morphine Nausea And Vomiting    Other reaction(s): Vomiting REACTION: Headache REACTION: Headache  . Penicillins Rash    Has patient had a PCN reaction causing immediate rash, facial/tongue/throat swelling, SOB or  lightheadedness with hypotension: Yes Has patient had a PCN reaction causing severe rash involving mucus membranes or skin necrosis: No Has patient had a PCN reaction that required hospitalization: No Has patient had a PCN reaction occurring within the last 10 years: No. If all of the above answers are "NO", then may proceed with Cephalosporin use.  . Tramadol Hcl Nausea And Vomiting    Other reaction(s): Vomiting "feels weird"    VITALS:  Blood pressure (!) 151/92, pulse 92, temperature 98.5 F (36.9 C), temperature source Oral, resp. rate 18, height 5\' 3"  (1.6 m), weight 80.4 kg, SpO2 99 %.  PHYSICAL EXAMINATION:   Physical Exam  GENERAL:  47 y.o.-year-old patient lying in bed with acute distress due to pain.  EYES: Pupils equal, round, reactive to light and accommodation. No scleral icterus. Extraocular muscles intact.  HEENT: Head atraumatic, normocephalic. Oropharynx and nasopharynx clear.  NECK:  Supple, no jugular venous distention. No thyroid enlargement, no tenderness.  LUNGS: Normal breath sounds bilaterally, no wheezing, rales, rhonchi. No use of accessory muscles of respiration.  CARDIOVASCULAR: S1, S2 normal. No murmurs, rubs, or gallops.  ABDOMEN: Soft, Tender in Epigastric area, no rebound, rigidity, nondistended. Bowel sounds present. No organomegaly or mass.  EXTREMITIES: No cyanosis, clubbing or edema b/l.    NEUROLOGIC: Cranial nerves II through XII are intact. No focal Motor or sensory deficits b/l.   PSYCHIATRIC: The patient is alert and oriented x 3.  SKIN: No obvious rash, lesion, or ulcer.    LABORATORY PANEL:   CBC Recent Labs  Lab 10/26/17 0449  WBC 10.3  HGB 8.8*  HCT 24.9*  PLT 514*   ------------------------------------------------------------------------------------------------------------------  Chemistries  Recent Labs  Lab 10/23/17 0430  10/25/17 0417  NA 135  --  138  K 3.9  3.9   < > 4.3  CL 95*  --  102  CO2 28  --  31  GLUCOSE  182*  --  102*  BUN 12  --  7  CREATININE 0.87  --  0.61  CALCIUM 8.8*  --  8.6*  MG 2.1  --   --   AST 16  --   --   ALT 14  --   --   ALKPHOS 142*  --   --   BILITOT 0.4  --   --    < > = values in this interval not displayed.   ------------------------------------------------------------------------------------------------------------------  Cardiac Enzymes No results for input(s): TROPONINI in the last 168 hours. ------------------------------------------------------------------------------------------------------------------  RADIOLOGY:  No results found.   ASSESSMENT AND PLAN:   47 year old female past medical history of hypertension, diabetes, irritable bowel syndrome, hypertriglyceridemia, who presents to the hospital due to abdominal pain nausea and vomiting and noted to have acute pancreatitis.  1.  Acute pancreatitis-secondary to severe hyperlipidemia/hypertriglyceridemia. - CT scan abdomen pelvis is not suggestive of any pancreatic necrosis.  Continue supportive care with aggressive IV fluid hydration, antiemetics, pain control. Pain is slowly getting better. Started on liquid diet .  Still have pancreatitis on repeat CT 10/20/17 , cont liquid diet, check Lipase.  Added long acting oxycodone for better pain control. Upgrading diet to full liquid now.  Patient is motivated to use oral pain medication and limit the use of IV Dilaudid.  Tried soft diet, but she could not tolrate- called GI consult.  Suggested to keep on IV fluids and dial back to clear liquids.  2.  Severe hypertriglyceridemia-patient's triglycerides are over 5000 with total cholesterol being over 1200.  Patient is supposed to be on TriCor but unclear if she is compliant with it. - Kept on IV insulin drip in stepdown level of care.  On TriCor.  She is intolerant to statins. Triglycerides gradually improving with insulin drip.  Counseled today again in presence of mother and daughter about diet control.   Stopped insuline drip,  on basal insuline now.  3.  Hyponatremia- likely pseudohyponatremia secondary to some mild hyperglycemia along with dehydration. - We will continue IV fluids, follow sodium. 133- 135 -137 now.  4.  Diabetes type 2 without complication-patient's hemoglobin A1c is as high as 15.  She was only on oral meds, she will need to be on insulin on discharge.   on the insulin drip given her severe hyper triglyceridemia. -Appreciate diabetes cocordinator input.  blood sugar is < 200 now.  5.  Diabetic neuropathy-continue gabapentin.  6.  Essential hypertension-   Started metoprolol, hydralazine pRN.  7. Depression - cont. Effexor.    All the records are reviewed and case discussed with Care Management/Social Worker. Management plans discussed with the patient, family and they are in agreement.  CODE STATUS: Full code  DVT Prophylaxis: Lovenox  TOTAL TIME TAKING CARE OF THIS PATIENT: 32 minutes.  Discussed with her husband in the room.  POSSIBLE D/C IN 1-2 DAYS, DEPENDING ON CLINICAL CONDITION.   Vaughan Basta M.D on 10/26/2017 at 2:10 PM  Between 7am to 6pm - Pager - [phone]  After 6pm go to [email or web address] - Proofreader  [facility]  Office  [phone]  CC: Primary care physician; Leonel Ramsay, MD

## 2017-10-26 NOTE — Plan of Care (Signed)

## 2017-10-26 NOTE — Care Management (Signed)
In the even that a decision is made regarding Repatha, a patient assistance form has been place in shadow chart.

## 2017-10-27 LAB — GLUCOSE, CAPILLARY
GLUCOSE-CAPILLARY: 109 mg/dL — AB (ref 70–99)
GLUCOSE-CAPILLARY: 146 mg/dL — AB (ref 70–99)

## 2017-10-27 MED ORDER — SENNOSIDES-DOCUSATE SODIUM 8.6-50 MG PO TABS: 1.0000 | ORAL_TABLET | Freq: Two times a day (BID) | ORAL | 0 refills | Status: DC

## 2017-10-27 MED ORDER — INSULIN DETEMIR 100 UNIT/ML ~~LOC~~ SOLN: 10.0000 [IU] | Freq: Every day | SUBCUTANEOUS | 11 refills | Status: DC

## 2017-10-27 MED ORDER — HYDROCODONE-ACETAMINOPHEN 5-325 MG PO TABS: 1.0000 | ORAL_TABLET | ORAL | 0 refills | Status: DC | PRN

## 2017-10-27 MED ORDER — FENOFIBRATE 145 MG PO TABS: 145.0000 mg | ORAL_TABLET | Freq: Every day | ORAL | 3 refills | Status: DC

## 2017-10-27 MED ORDER — OXYCODONE HCL ER 15 MG PO T12A: 15.0000 mg | EXTENDED_RELEASE_TABLET | Freq: Two times a day (BID) | ORAL | 0 refills | Status: DC

## 2017-10-27 MED ORDER — ONDANSETRON HCL 4 MG PO TABS: 4.0000 mg | ORAL_TABLET | Freq: Four times a day (QID) | ORAL | 0 refills | Status: DC | PRN

## 2017-10-27 NOTE — Care Management (Signed)
Discharge to home today per Dr, Anselm Jungling. Referral to [facility]. Will fax prescriptions [facility] Shelbie Ammons RN MSN CCM [facility] [phone]

## 2017-10-27 NOTE — Progress Notes (Signed)
Kelly Rollins , MD [address], [place], [place], [place], [address] [address], [place], [place], [place], [address] Phone: [phone]  Fax: [phone]   Kelly Rollins is being followed for Acute pancreatitis  Day 2 of follow up   Subjective: Pain less intense, able to tolerate a bit PO   Objective: Vital signs in last 24 hours: Vitals:   10/26/17 0438 10/26/17 1926 10/27/17 0411 10/27/17 1030  BP: (!) 151/92 (!) 144/86 126/71 (!) 146/90  Pulse: 92 82 85 91  Resp: '18 20 20 16  ' Temp: 98.5 F (36.9 C) 98.3 F (36.8 C) 98.2 F (36.8 C)   TempSrc: Oral Oral Oral   SpO2: 99% 97% 95% 98%  Weight:      Height:       Weight change:   Intake/Output Summary (Last 24 hours) at 10/27/2017 1203 Last data filed at 10/27/2017 0900 Gross per 24 hour  Intake 1230 ml  Output -  Net 1230 ml     Exam: Heart:: Regular rate and rhythm, S1S2 present or without murmur or extra heart sounds Lungs: normal, clear to auscultation and clear to auscultation and percussion Abdomen: soft, nontender, normal bowel sounds   Lab Results: '@LABTEST2' @ Micro Results: Recent Results (from the past 240 hour(s))  C difficile quick scan w PCR reflex     Status: None   Collection Time: 10/18/17  7:00 AM  Result Value Ref Range Status   C Diff antigen NEGATIVE NEGATIVE Final   C Diff toxin NEGATIVE NEGATIVE Final   C Diff interpretation No C. difficile detected.  Final    Comment: Performed at [facility], [address] [place], [place] [address]   Studies/Results: No results found. Medications: I have reviewed the patient's current medications. Scheduled Meds: . calcium citrate-vitamin D  2 tablet Oral Q breakfast  . cholecalciferol  5,000 Units Oral Q breakfast  . enoxaparin (LOVENOX) injection  40 mg Subcutaneous Q24H  . famotidine  20 mg Oral BID  . fenofibrate  160 mg Oral Daily  . gabapentin  300 mg Oral QHS  . Influenza vac split quadrivalent PF  0.5 mL  Intramuscular Tomorrow-1000  . insulin aspart  0-15 Units Subcutaneous TID WC  . insulin aspart  0-5 Units Subcutaneous QHS  . insulin detemir  10 Units Subcutaneous Daily  . magnesium oxide  400 mg Oral BID AC & HS  . metoprolol succinate  50 mg Oral Daily  . multivitamin with minerals  1 tablet Oral Daily  . oxyCODONE  15 mg Oral Q12H  . polyethylene glycol  17 g Oral BID  . senna-docusate  1 tablet Oral BID  . venlafaxine XR  37.5 mg Oral Q breakfast   Continuous Infusions: . sodium chloride 75 mL/hr at 10/26/17 2221   PRN Meds:.acetaminophen **OR** acetaminophen, alum & mag hydroxide-simeth, aspirin-acetaminophen-caffeine, bisacodyl, cyclobenzaprine, dextrose, diphenhydrAMINE, hydrALAZINE, HYDROcodone-acetaminophen, HYDROmorphone (DILAUDID) injection, ketorolac, ondansetron **OR** ondansetron (ZOFRAN) IV, promethazine   Assessment: Active Problems:   Pancreatitis  CBC Latest Ref Rng & Units 10/26/2017 10/25/2017 10/21/2017  WBC 3.6 - 11.0 K/uL 10.3 10.4 8.7  Hemoglobin 12.0 - 16.0 g/dL 8.8(L) 8.5(L) 10.2(L)  Hematocrit 35.0 - 47.0 % 24.9(L) 24.1(L) 29.9(L)  Platelets 150 - 440 K/uL 514(H) 422 305   Hepatic Function Latest Ref Rng & Units 10/23/2017 10/16/2017 10/14/2017  Total Protein 6.5 - 8.1 g/dL 6.4(L) - 7.3  Albumin 3.5 - 5.0 g/dL 2.3(L) 2.8(L) 3.8  AST 15 - 41 U/L 16 -  53(H)  ALT 0 - 44 U/L 14 - 36  Alk Phosphatase 38 - 126 U/L 142(H) - 92  Total Bilirubin 0.3 - 1.2 mg/dL 0.4 - 0.2(L)  Bilirubin, Direct 0.0 - 0.3 mg/dL - - -    Kelly M Langley47 y.o.femalewith a known history of pancreatitis secondary to hypertriglyceridemia admitted with acute pancreatitis. Elevated triglyceride levels over 5000. Admitted on 10/14/2017. CT scan of the abdomen on 10/20/2017 shows acute pancreatitis without any drainable fluid. Secondary ascitisalong the duodenum pericolic gutter and right pelvis. Status post cholecystectomy, no intrahepatic or extrahepatic ductal dilation. CMP on  10/23/2017 shows normal transaminases with a total bilirubin of 0.4 and an alkaline phosphatase of 142.Mother has had pancreatitis, H/o elevated TGL evebn 10 years back.   Plan: 1.She will need medications to lower her triglyceride levels and prevent such an episode from occurring. Tight glycemic control as hypertriglyceridemia may be secondary to poorly controlled diabetes. 2. Advance diet gradually as tolerated.    LOS: 13 days   Kelly Bellows, MD 10/27/2017, 12:03 PM

## 2017-10-27 NOTE — Progress Notes (Signed)
Patient going home newly on insulin. Reports she has given herself injections before and is comfortable. Patient able to verbalize appropriate location for shots. Patient reports that she did give herself a few shots since being inpatient. She is in possession of the insulin starter kit. She is able to verbalize how she will draw the insulin up from the vial. She states that she plans to attend some diabetes classes outpatient.

## 2017-10-27 NOTE — Discharge Summary (Signed)
[facility] NAME: Kelly Rollins    MR#:  [id number]  DATE OF BIRTH:  May 26, 1970  DATE OF ADMISSION:  10/14/2017 ADMITTING PHYSICIAN: Bettey Costa, MD  DATE OF DISCHARGE: 10/27/2017   PRIMARY CARE PHYSICIAN: Leonel Ramsay, MD    ADMISSION DIAGNOSIS:  Acute pancreatitis, unspecified complication status, unspecified pancreatitis type [K85.90]  DISCHARGE DIAGNOSIS:  Active Problems:   Pancreatitis   SECONDARY DIAGNOSIS:   Past Medical History:  Diagnosis Date  . ABDOMINAL PAIN OTHER SPECIFIED SITE 02/21/2009   Qualifier: Diagnosis of  By: Deborra Medina MD, Tanja Port    . ABDOMINAL PAIN-LUQ 04/17/2009   Qualifier: Diagnosis of  By: Fuller Plan MD Marijo Conception Anal fissure   . Asthma   . Diabetes mellitus without complication ([place])   . Headache(784.0)   . HEMATOCHEZIA 04/17/2009   Qualifier: Diagnosis of  By: Fuller Plan MD Marijo Conception Hypertension   . IBS (irritable bowel syndrome)   . Migraine headache 11/06/2008   Qualifier: Diagnosis of  By: Deborra Medina MD, Tanja Port    . OVARIAN CYST, RIGHT 02/21/2009   Qualifier: Diagnosis of  By: Deborra Medina MD, Tanja Port    . PALPITATIONS, OCCASIONAL 04/17/2010   Qualifier: Diagnosis of  By: Deborra Medina MD, [place]:   47 year old female past medical history of hypertension, diabetes, irritable bowel syndrome, hypertriglyceridemia, who presents to the hospital due to abdominal pain nausea and vomiting and noted to have acute pancreatitis.  1.  Acute pancreatitis-secondary to severe hyperlipidemia/hypertriglyceridemia. - CT scan abdomen pelvis is not suggestive of any pancreatic necrosis.  Continue supportive care with aggressive IV fluid hydration, antiemetics, pain control. Pain is slowly getting better. Started on liquid diet .  Still have pancreatitis on repeat CT 10/20/17 , cont liquid diet, check Lipase.  Added long acting oxycodone for better pain control. Upgrading diet to full  liquid now.  Patient is motivated to use oral pain medication and limit the use of IV Dilaudid.  Tried soft diet, but she could not tolrate- called GI consult.  Suggested to keep on IV fluids and dial back to clear liquids. Patient had some improvement in her pain and gradually upgraded to full liquid and soft diet.  Because of her long stay and very slow recovery GI has advised very slow upgrade in the diet.  I have explained that to the patient that next 1 week she need to keep having more liquid in her food and a little bit of soft food and do not do overeat. As her management plan is same in hospital versus home and she is motivated to just try oral pain medications, she agrees and prefers to go home and continue her treatment.  2.  Severe hypertriglyceridemia-patient's triglycerides are over 5000 with total cholesterol being over 1200.  Patient is supposed to be on TriCor but unclear if she is compliant with it. - Kept on IV insulin drip in stepdown level of care.  On TriCor.  She is intolerant to statins. Triglycerides gradually improving with insulin drip.  Counseled today again in presence of mother and daughter about diet control.  Stopped insuline drip,  on basal insuline now. Give TriCor and Levemir.  3.  Hyponatremia- likely pseudohyponatremia secondary to some mild hyperglycemia along with dehydration. - We will continue IV fluids, follow sodium. 133- 135 -137 now.  4.  Diabetes type 2 without complication-patient's hemoglobin A1c is as high as  15.  She was only on oral meds, she will need to be on insulin on discharge.   on the insulin drip given her severe hyper triglyceridemia. -Appreciate diabetes cocordinator input.  blood sugar is < 200 now.  5.  Diabetic neuropathy-continue gabapentin.  6.  Essential hypertension-   Started metoprolol, hydralazine pRN.  7. Depression - cont. Effexor.    DISCHARGE CONDITIONS:   Stable.  CONSULTS OBTAINED:  Treatment Team:   Lin Landsman, MD Jonathon Bellows, MD  DRUG ALLERGIES:   Allergies  Allergen Reactions  . Atorvastatin     Other reaction(s): Muscle Pain  . Levofloxacin Hives  . Morphine Nausea And Vomiting    Other reaction(s): Vomiting REACTION: Headache REACTION: Headache  . Penicillins Rash    Has patient had a PCN reaction causing immediate rash, facial/tongue/throat swelling, SOB or lightheadedness with hypotension: Yes Has patient had a PCN reaction causing severe rash involving mucus membranes or skin necrosis: No Has patient had a PCN reaction that required hospitalization: No Has patient had a PCN reaction occurring within the last 10 years: No. If all of the above answers are "NO", then may proceed with Cephalosporin use.  . Tramadol Hcl Nausea And Vomiting    Other reaction(s): Vomiting "feels weird"    DISCHARGE MEDICATIONS:   Allergies as of 10/27/2017      Reactions   Atorvastatin    Other reaction(s): Muscle Pain   Levofloxacin Hives   Morphine Nausea And Vomiting   Other reaction(s): Vomiting REACTION: Headache REACTION: Headache   Penicillins Rash   Has patient had a PCN reaction causing immediate rash, facial/tongue/throat swelling, SOB or lightheadedness with hypotension: Yes Has patient had a PCN reaction causing severe rash involving mucus membranes or skin necrosis: No Has patient had a PCN reaction that required hospitalization: No Has patient had a PCN reaction occurring within the last 10 years: No. If all of the above answers are "NO", then may proceed with Cephalosporin use.   Tramadol Hcl Nausea And Vomiting   Other reaction(s): Vomiting "feels weird"      Medication List    STOP taking these medications   cyclobenzaprine 5 MG tablet Commonly known as:  FLEXERIL   Magnesium 500 MG Caps   metFORMIN 500 MG tablet Commonly known as:  GLUCOPHAGE   naproxen 500 MG EC tablet Commonly known as:  EC NAPROSYN   Vitamin D3 5000 units Caps      TAKE these medications   fenofibrate 145 MG tablet Commonly known as:  TRICOR Take 1 tablet (145 mg total) by mouth daily.   gabapentin 300 MG capsule Commonly known as:  NEURONTIN Take 1 capsule (300 mg total) by mouth at bedtime. What changed:  Another medication with the same name was removed. Continue taking this medication, and follow the directions you see here.   GNP CALCIUM 1200 1200-1000 MG-UNIT Chew Chew 1,200 mg by mouth daily with breakfast. Take in combination with vitamin D and magnesium.   HYDROcodone-acetaminophen 5-325 MG tablet Commonly known as:  NORCO/VICODIN Take 1-2 tablets by mouth every 4 (four) hours as needed for moderate pain or severe pain.   insulin detemir 100 UNIT/ML injection Commonly known as:  LEVEMIR Inject 0.1 mLs (10 Units total) into the skin daily. Start taking on:  10/28/2017   metoprolol succinate 100 MG 24 hr tablet Commonly known as:  TOPROL-XL Take 100 mg by mouth daily.   omeprazole 20 MG capsule Commonly known as:  PRILOSEC Take 1 capsule (20  mg total) by mouth daily.   ondansetron 4 MG tablet Commonly known as:  ZOFRAN Take 1 tablet (4 mg total) by mouth every 6 (six) hours as needed for nausea.   oxyCODONE 15 mg 12 hr tablet Commonly known as:  OXYCONTIN Take 1 tablet (15 mg total) by mouth every 12 (twelve) hours.   senna-docusate 8.6-50 MG tablet Commonly known as:  Senokot-S Take 1 tablet by mouth 2 (two) times daily.   venlafaxine XR 37.5 MG 24 hr capsule Commonly known as:  EFFEXOR-XR Take 37.5 mg by mouth daily.        DISCHARGE INSTRUCTIONS:    Slow and gradual upgrading diet.  If you experience worsening of your admission symptoms, develop shortness of breath, life threatening emergency, suicidal or homicidal thoughts you must seek medical attention immediately by calling 911 or calling your MD immediately  if symptoms less severe.  You Must read complete instructions/literature along with all the  possible adverse reactions/side effects for all the Medicines you take and that have been prescribed to you. Take any new Medicines after you have completely understood and accept all the possible adverse reactions/side effects.   Please note  You were cared for by a hospitalist during your hospital stay. If you have any questions about your discharge medications or the care you received while you were in the hospital after you are discharged, you can call the unit and asked to speak with the hospitalist on call if the hospitalist that took care of you is not available. Once you are discharged, your primary care physician will handle any further medical issues. Please note that NO REFILLS for any discharge medications will be authorized once you are discharged, as it is imperative that you return to your primary care physician (or establish a relationship with a primary care physician if you do not have one) for your aftercare needs so that they can reassess your need for medications and monitor your lab values.    Today   CHIEF COMPLAINT:  No chief complaint on file.   HISTORY OF PRESENT ILLNESS:  Haylea Schlichting  is a 47 y.o. female with a known history of pancreatitis due to hyper lipidemia and diabetes who presents emergency room due to abdominal pain. Patient's midepigastric abdominal pain began this evening after eating a sandwich.  She has had nausea and some vomiting while in the emergency room.  She denies fever chills.  Pain is 10 out of 10.  She does have relief with fentanyl which was given in the ER. Patient denies fever, chills, diarrhea.  CT scan shows acute pancreatitis. Patient does not drink alcohol No gallstones are seen  VITAL SIGNS:  Blood pressure (!) 146/90, pulse 91, temperature 98.2 F (36.8 C), temperature source Oral, resp. rate 16, height 5\' 3"  (1.6 m), weight 80.4 kg, SpO2 98 %.  I/O:    Intake/Output Summary (Last 24 hours) at 10/27/2017 1309 Last data  filed at 10/27/2017 0900 Gross per 24 hour  Intake 1230 ml  Output -  Net 1230 ml    PHYSICAL EXAMINATION:   GENERAL:  47 y.o.-year-old patient lying in bed with acute distress due to pain.  EYES: Pupils equal, round, reactive to light and accommodation. No scleral icterus. Extraocular muscles intact.  HEENT: Head atraumatic, normocephalic. Oropharynx and nasopharynx clear.  NECK:  Supple, no jugular venous distention. No thyroid enlargement, no tenderness.  LUNGS: Normal breath sounds bilaterally, no wheezing, rales, rhonchi. No use of accessory muscles of respiration.  CARDIOVASCULAR: S1, S2 normal. No murmurs, rubs, or gallops.  ABDOMEN: Soft, Tender in Epigastric area, no rebound, rigidity, nondistended. Bowel sounds present. No organomegaly or mass.  EXTREMITIES: No cyanosis, clubbing or edema b/l.    NEUROLOGIC: Cranial nerves II through XII are intact. No focal Motor or sensory deficits b/l.   PSYCHIATRIC: The patient is alert and oriented x 3.  SKIN: No obvious rash, lesion, or ulcer.    DATA REVIEW:   CBC Recent Labs  Lab 10/26/17 0449  WBC 10.3  HGB 8.8*  HCT 24.9*  PLT 514*    Chemistries  Recent Labs  Lab 10/23/17 0430  10/25/17 0417  NA 135  --  138  K 3.9  3.9   < > 4.3  CL 95*  --  102  CO2 28  --  31  GLUCOSE 182*  --  102*  BUN 12  --  7  CREATININE 0.87  --  0.61  CALCIUM 8.8*  --  8.6*  MG 2.1  --   --   AST 16  --   --   ALT 14  --   --   ALKPHOS 142*  --   --   BILITOT 0.4  --   --    < > = values in this interval not displayed.    Cardiac Enzymes No results for input(s): TROPONINI in the last 168 hours.  Microbiology Results  Results for orders placed or performed during the hospital encounter of 10/14/17  MRSA PCR Screening     Status: None   Collection Time: 10/15/17  3:56 PM  Result Value Ref Range Status   MRSA by PCR NEGATIVE NEGATIVE Final    Comment:        The GeneXpert MRSA Assay (FDA approved for NASAL  specimens only), is one component of a comprehensive MRSA colonization surveillance program. It is not intended to diagnose MRSA infection nor to guide or monitor treatment for MRSA infections. Performed at [facility], [address] [place], [place] [address]   C difficile quick scan w PCR reflex     Status: None   Collection Time: 10/18/17  7:00 AM  Result Value Ref Range Status   C Diff antigen NEGATIVE NEGATIVE Final   C Diff toxin NEGATIVE NEGATIVE Final   C Diff interpretation No C. difficile detected.  Final    Comment: Performed at [facility], [address] [place], [place] [address]    RADIOLOGY:  No results found.  EKG:   Orders placed or performed during the hospital encounter of 10/14/17  . EKG 12-Lead  . EKG 12-Lead  . ED EKG  . ED EKG   Management plans discussed with the patient, family and they are in agreement.  CODE STATUS: Full.    Code Status Orders  (From admission, onward)         Start     Ordered   10/14/17 2230  Full code  Continuous     10/14/17 2229        Code Status History    This patient has a current code status but no historical code status.      TOTAL TIME TAKING CARE OF THIS PATIENT: 35 minutes.    Vaughan Basta M.D on 10/27/2017 at 1:09 PM  Between 7am to 6pm - Pager - [phone]  After 6pm go to [email or web address] - password [email or web address] [facility]  Office  [phone]  CC: Primary care physician;  Leonel Ramsay, MD   Note: This dictation was prepared with Dragon dictation along with smaller phrase technology. Any transcriptional errors that result from this process are unintentional.

## 2017-11-02 ENCOUNTER — Encounter: Payer: Self-pay | Admitting: Internal Medicine

## 2017-11-02 NOTE — Progress Notes (Signed)
SCANNED PSG REPORT DONE ON 03/30/17

## 2017-11-05 ENCOUNTER — Encounter: Payer: Self-pay | Admitting: Emergency Medicine

## 2017-11-05 ENCOUNTER — Other Ambulatory Visit: Payer: Self-pay

## 2017-11-05 ENCOUNTER — Emergency Department
Admission: EM | Admit: 2017-11-05 | Discharge: 2017-11-05 | Disposition: A | Discharge status: Home / Self Care | Payer: Self-pay | Attending: Emergency Medicine | Admitting: Emergency Medicine

## 2017-11-05 DIAGNOSIS — Z87891 Personal history of nicotine dependence: Secondary | ICD-10-CM | POA: Insufficient documentation

## 2017-11-05 DIAGNOSIS — K219 Gastro-esophageal reflux disease without esophagitis: Secondary | ICD-10-CM | POA: Insufficient documentation

## 2017-11-05 DIAGNOSIS — E119 Type 2 diabetes mellitus without complications: Secondary | ICD-10-CM | POA: Insufficient documentation

## 2017-11-05 DIAGNOSIS — K2900 Acute gastritis without bleeding: Secondary | ICD-10-CM | POA: Insufficient documentation

## 2017-11-05 DIAGNOSIS — Z79899 Other long term (current) drug therapy: Secondary | ICD-10-CM | POA: Insufficient documentation

## 2017-11-05 DIAGNOSIS — Z794 Long term (current) use of insulin: Secondary | ICD-10-CM | POA: Insufficient documentation

## 2017-11-05 DIAGNOSIS — R1110 Vomiting, unspecified: Secondary | ICD-10-CM

## 2017-11-05 DIAGNOSIS — R1013 Epigastric pain: Secondary | ICD-10-CM | POA: Insufficient documentation

## 2017-11-05 DIAGNOSIS — I10 Essential (primary) hypertension: Secondary | ICD-10-CM | POA: Insufficient documentation

## 2017-11-05 DIAGNOSIS — J45909 Unspecified asthma, uncomplicated: Secondary | ICD-10-CM | POA: Insufficient documentation

## 2017-11-05 LAB — URINALYSIS, COMPLETE (UACMP) WITH MICROSCOPIC
Bilirubin Urine: NEGATIVE
GLUCOSE, UA: NEGATIVE mg/dL
Hgb urine dipstick: NEGATIVE
KETONES UR: NEGATIVE mg/dL
LEUKOCYTES UA: NEGATIVE
Nitrite: NEGATIVE
PH: 7 (ref 5.0–8.0)
Protein, ur: NEGATIVE mg/dL
SPECIFIC GRAVITY, URINE: 1.006 (ref 1.005–1.030)

## 2017-11-05 LAB — CBC
HEMATOCRIT: 34 % — AB (ref 35.0–47.0)
Hemoglobin: 11.5 g/dL — ABNORMAL LOW (ref 12.0–16.0)
MCH: 28.7 pg (ref 26.0–34.0)
MCHC: 33.9 g/dL (ref 32.0–36.0)
MCV: 84.4 fL (ref 80.0–100.0)
PLATELETS: 545 10*3/uL — AB (ref 150–440)
RBC: 4.03 MIL/uL (ref 3.80–5.20)
RDW: 15.2 % — AB (ref 11.5–14.5)
WBC: 7.8 10*3/uL (ref 3.6–11.0)

## 2017-11-05 LAB — COMPREHENSIVE METABOLIC PANEL
ALBUMIN: 3.7 g/dL (ref 3.5–5.0)
ALT: 41 U/L (ref 0–44)
AST: 48 U/L — AB (ref 15–41)
Alkaline Phosphatase: 153 U/L — ABNORMAL HIGH (ref 38–126)
Anion gap: 15 (ref 5–15)
BUN: 10 mg/dL (ref 6–20)
CHLORIDE: 100 mmol/L (ref 98–111)
CO2: 22 mmol/L (ref 22–32)
CREATININE: 0.85 mg/dL (ref 0.44–1.00)
Calcium: 9.5 mg/dL (ref 8.9–10.3)
GFR calc Af Amer: >60 mL/min (ref >60)
GLUCOSE: 220 mg/dL — AB (ref 70–99)
POTASSIUM: 3.9 mmol/L (ref 3.5–5.1)
Sodium: 137 mmol/L (ref 135–145)
Total Bilirubin: 0.6 mg/dL (ref 0.3–1.2)
Total Protein: 8 g/dL (ref 6.5–8.1)

## 2017-11-05 LAB — TROPONIN I: Troponin I: <0.03 ng/mL (ref <0.03)

## 2017-11-05 LAB — LIPASE, BLOOD: LIPASE: 27 U/L (ref 11–51)

## 2017-11-05 MED ORDER — HYDROCODONE-ACETAMINOPHEN 5-325 MG PO TABS: 1.0000 | ORAL_TABLET | Freq: Four times a day (QID) | ORAL | 0 refills | Status: DC | PRN

## 2017-11-05 MED ORDER — SODIUM CHLORIDE 0.9 % IV BOLUS
1000.0000 mL | Freq: Once | INTRAVENOUS | Status: AC
  Administered 2017-11-05: 1000 mL via INTRAVENOUS

## 2017-11-05 MED ORDER — ONDANSETRON HCL 4 MG/2ML IJ SOLN
4.0000 mg | Freq: Once | INTRAMUSCULAR | Status: AC
  Administered 2017-11-05: 4 mg via INTRAVENOUS
  Filled 2017-11-05: qty 2

## 2017-11-05 MED ORDER — FENTANYL CITRATE (PF) 100 MCG/2ML IJ SOLN
50.0000 ug | INTRAMUSCULAR | Status: DC | PRN
  Administered 2017-11-05: 50 ug via INTRAVENOUS
  Filled 2017-11-05: qty 2

## 2017-11-05 MED ORDER — ONDANSETRON HCL 4 MG PO TABS: 4.0000 mg | ORAL_TABLET | Freq: Three times a day (TID) | ORAL | 0 refills | Status: DC | PRN

## 2017-11-05 MED ORDER — ONDANSETRON HCL 4 MG/2ML IJ SOLN
4.0000 mg | Freq: Once | INTRAMUSCULAR | Status: AC | PRN
  Administered 2017-11-05: 4 mg via INTRAVENOUS
  Filled 2017-11-05: qty 2

## 2017-11-05 MED ORDER — PROMETHAZINE HCL 25 MG RE SUPP: 25.0000 mg | Freq: Four times a day (QID) | RECTAL | 0 refills | Status: DC | PRN

## 2017-11-05 MED ORDER — HYDROMORPHONE HCL 1 MG/ML IJ SOLN
1.0000 mg | Freq: Once | INTRAMUSCULAR | Status: AC
  Administered 2017-11-05: 1 mg via INTRAVENOUS
  Filled 2017-11-05: qty 1

## 2017-11-05 MED ORDER — GI COCKTAIL ~~LOC~~
ORAL | Status: AC
  Filled 2017-11-05: qty 30

## 2017-11-05 MED ORDER — GI COCKTAIL ~~LOC~~
30.0000 mL | Freq: Once | ORAL | Status: AC
  Administered 2017-11-05: 30 mL via ORAL

## 2017-11-05 MED ORDER — HYDROCODONE-ACETAMINOPHEN 5-325 MG PO TABS
ORAL_TABLET | ORAL | Status: AC
  Administered 2017-11-05: 2 via ORAL
  Filled 2017-11-05: qty 2

## 2017-11-05 MED ORDER — PROMETHAZINE HCL 12.5 MG PO TABS: 25.0000 mg | ORAL_TABLET | Freq: Four times a day (QID) | ORAL | 0 refills | Status: DC | PRN

## 2017-11-05 MED ORDER — HYDROCODONE-ACETAMINOPHEN 5-325 MG PO TABS
2.0000 | ORAL_TABLET | Freq: Once | ORAL | Status: AC
  Administered 2017-11-05: 2 via ORAL

## 2017-11-05 NOTE — ED Notes (Signed)
Pt discharged home after verbalizing understanding of discharge instructions; nad noted. 

## 2017-11-05 NOTE — ED Triage Notes (Signed)
Patient to ER for c/o upper mid abd pain. Patient was discharged from hospital recently (admitted for pancreatitis). States she feels like pain has not improved and has still been vomiting. Patient reports emesis 2 times per day for last few days. States last night while sleeping, she had BM on herself. States stools are clay colored. Patient states she has been trying to advance to solids for her diet, but has been unable to since leaving hospital.

## 2017-11-05 NOTE — Discharge Instructions (Addendum)
You are evaluated for upper abdominal pain and vomiting and although no certain cause was found, your exam and evaluation are overall reassuring in the emergency department today.  Return to emergency department immediately for any worsening condition including worsening or uncontrolled pain, vomiting blood, black or bloody stools, or unable to tolerate food and drink, concern for dehydration such as dry mouth or not making urine, or any fever or altered mental status.

## 2017-11-05 NOTE — ED Notes (Signed)
Pt reports that she was admitted sept 19-25th for pancreatitis, pt reports that she had a few days that were good, but then started feeling bad again, pt states that her abd is hurting so bad that it feels like it is on the outside of her abd. Pt reports that she woke up at 0330 and had a loose stool on herself in her sleep, pt reports that her stools have consistently been loose and a light beige color. Pt reports a lot of dry heaving, states that she is taking her nausea medications, and reports that she has taken all her pain medications without relief. Pt states that this episode feels different and that her throat is burning and the pain across the top of her abdomen is severe and she is tender to touch. Pt's husband at bedside, pt holding a rolled blanket across her abd for comfort

## 2017-11-05 NOTE — ED Notes (Signed)
Dr Reita Cliche at bedside with pt discussing plan of care

## 2017-11-05 NOTE — ED Provider Notes (Signed)
[facility] Emergency Department Provider Note ____________________________________________   I have reviewed the triage vital signs and the triage nursing note.  HISTORY  Chief Complaint Abdominal Pain   Historian Patient  HPI Kelly Rollins is a 47 y.o. female with a history of recently diagnosed pancreatitis associated with hypertriglyceridemia, as well as uncontrolled diabetes, recently discharged from the hospital with an episode of prolonged symptoms due to pancreatitis, discharge 10/27/2017 on long-acting insulin in the mornings for the first time, presenting today with 12 hours of nausea, nonbloody nonbilious emesis, and severe epigastric pain, currently 8 out of 10 feels similar to prior episode of pancreatitis.  She also states that she thinks that she has "acid."  States that she cannot even tolerate applesauce and threw that up this morning.  She did have some diarrhea this morning.  No fevers.  No coughing.  No trouble breathing.  No altered mental status.     Past Medical History:  Diagnosis Date  . ABDOMINAL PAIN OTHER SPECIFIED SITE 02/21/2009   Qualifier: Diagnosis of  By: Deborra Medina MD, Tanja Port    . ABDOMINAL PAIN-LUQ 04/17/2009   Qualifier: Diagnosis of  By: Fuller Plan MD Marijo Conception Anal fissure   . Asthma   . Diabetes mellitus without complication ([place])   . Headache(784.0)   . HEMATOCHEZIA 04/17/2009   Qualifier: Diagnosis of  By: Fuller Plan MD Marijo Conception Hypertension   . IBS (irritable bowel syndrome)   . Migraine headache 11/06/2008   Qualifier: Diagnosis of  By: Deborra Medina MD, Tanja Port    . OVARIAN CYST, RIGHT 02/21/2009   Qualifier: Diagnosis of  By: Deborra Medina MD, Tanja Port    . PALPITATIONS, OCCASIONAL 04/17/2010   Qualifier: Diagnosis of  By: Deborra Medina MD, Talia      Patient Active Problem List   Diagnosis Date Noted  . Pancreatitis 10/14/2017  . Chronic hip pain (Secondary source of pain) (Bilateral) (L>R) 05/19/2017  . Elevated C-reactive  protein (CRP) 05/19/2017  . Elevated sed rate 05/19/2017  . Spondylosis without myelopathy or radiculopathy, lumbosacral region 05/19/2017  . Pharmacologic therapy 05/06/2017  . Disorder of skeletal system 05/06/2017  . Problems influencing health status 05/06/2017  . Chest pain with moderate risk of acute coronary syndrome 04/21/2017  . DOE (dyspnea on exertion) 04/21/2017  . Intermittent palpitations 04/21/2017  . Lumbar radiculopathy 09/08/2016  . Acute postoperative pain 06/08/2016  . Vitamin D insufficiency 05/04/2016  . Depression 02/04/2016  . Diabetes mellitus type 2, uncomplicated ([place]) 92/42/6834  . Fatty liver 02/04/2016  . HTN (hypertension), benign 02/04/2016  . Chronic pain syndrome 01/15/2016  . Acute low back pain 05/28/2015  . Lumbar transverse process fracture ([place]) (Left L1) (03/11/15) 05/28/2015  . Long term current use of opiate analgesic 05/01/2015  . Long term prescription opiate use 05/01/2015  . Opiate use (25 MME/Day) 05/01/2015  . Encounter for therapeutic drug level monitoring 05/01/2015  . Encounter for pain management planning 05/01/2015  . Chronic low back pain (Primary Source of Pain) (Bilateral) (L>R) 05/01/2015  . Lumbar facet syndrome (Bilateral) (L>R) 05/01/2015  . Chronic knee pain [facility] source of pain) (Bilateral) (L>R) 05/01/2015  . Lumbar spondylosis 05/01/2015  . Neuropathic pain 05/01/2015  . Neurogenic pain 05/01/2015  . Myofascial pain 05/01/2015  . Essential hypertriglyceridemia 04/26/2014  . Anxiety 05/15/2010  . Obesity 05/15/2010  . Hyperlipidemia 04/03/2010  . Fatty liver disease 04/02/2010  . Pure hypercholesterolemia 03/19/2010  . GERD 04/17/2009  . Constipation 04/17/2009  .  TOBACCO ABUSE 11/06/2008    Past Surgical History:  Procedure Laterality Date  . BREAST REDUCTION SURGERY     bilateral  . CESAREAN SECTION    . CHOLECYSTECTOMY    . VAGINAL HYSTERECTOMY      Prior to Admission medications   Medication Sig  Start Date End Date Taking? Authorizing Provider  fenofibrate (TRICOR) 145 MG tablet Take 1 tablet (145 mg total) by mouth daily. 10/27/17  Yes Vaughan Basta, MD  insulin detemir (LEVEMIR) 100 UNIT/ML injection Inject 0.1 mLs (10 Units total) into the skin daily. 10/28/17  Yes Vaughan Basta, MD  metoprolol succinate (TOPROL-XL) 100 MG 24 hr tablet Take 100 mg by mouth daily.  11/09/16  Yes [provider]  oxyCODONE (OXYCONTIN) 15 mg 12 hr tablet Take 1 tablet (15 mg total) by mouth every 12 (twelve) hours. 10/27/17  Yes Vaughan Basta, MD  senna-docusate (SENOKOT-S) 8.6-50 MG tablet Take 1 tablet by mouth 2 (two) times daily. 10/27/17  Yes Vaughan Basta, MD  venlafaxine XR (EFFEXOR-XR) 37.5 MG 24 hr capsule Take 37.5 mg by mouth daily. 10/04/17  Yes [provider]  Calcium Carbonate-Vit D-Min (GNP CALCIUM 1200) 1200-1000 MG-UNIT CHEW Chew 1,200 mg by mouth daily with breakfast. Take in combination with vitamin D and magnesium. Patient not taking: Reported on 10/18/2017 05/19/17 11/15/17  Milinda Pointer, MD  gabapentin (NEURONTIN) 300 MG capsule Take 1 capsule (300 mg total) by mouth at bedtime. Patient not taking: Reported on 10/18/2017 05/19/17 11/15/17  Milinda Pointer, MD  HYDROcodone-acetaminophen (NORCO/VICODIN) 5-325 MG tablet Take 1 tablet by mouth every 6 (six) hours as needed for moderate pain. 11/05/17   Lisa Roca, MD  omeprazole (PRILOSEC) 20 MG capsule Take 1 capsule (20 mg total) by mouth daily. Patient not taking: Reported on 10/18/2017 05/19/17 05/19/18  Milinda Pointer, MD  ondansetron (ZOFRAN) 4 MG tablet Take 1 tablet (4 mg total) by mouth every 8 (eight) hours as needed for nausea or vomiting. 11/05/17   Lisa Roca, MD  promethazine (PHENERGAN) 12.5 MG tablet Take 2 tablets (25 mg total) by mouth every 6 (six) hours as needed for nausea or vomiting. 11/05/17   Lisa Roca, MD  promethazine (PHENERGAN) 25 MG suppository Place  1 suppository (25 mg total) rectally every 6 (six) hours as needed for nausea or vomiting. 11/05/17   Lisa Roca, MD    Allergies  Allergen Reactions  . Atorvastatin     Other reaction(s): Muscle Pain  . Levofloxacin Hives  . Morphine Nausea And Vomiting    Other reaction(s): Vomiting REACTION: Headache REACTION: Headache  . Penicillins Rash    Has patient had a PCN reaction causing immediate rash, facial/tongue/throat swelling, SOB or lightheadedness with hypotension: Yes Has patient had a PCN reaction causing severe rash involving mucus membranes or skin necrosis: No Has patient had a PCN reaction that required hospitalization: No Has patient had a PCN reaction occurring within the last 10 years: No. If all of the above answers are "NO", then may proceed with Cephalosporin use.  . Tramadol Hcl Nausea And Vomiting    Other reaction(s): Vomiting "feels weird"    Family History  Problem Relation Age of Onset  . Cancer Mother        throat in the 28's  . Hyperlipidemia Mother   . Hypertension Mother   . Hyperlipidemia Father   . Hypertension Father   . Heart disease Father        S/P two stents    Social History Social History  Tobacco Use  . Smoking status: Former Smoker    Types: Cigarettes    Last attempt to quit: 09/03/2014    Years since quitting: 3.1  . Smokeless tobacco: Never Used  Substance Use Topics  . Alcohol use: Yes    Comment: occassional  . Drug use: No    Review of Systems  Constitutional: Negative for fever. Eyes: Negative for visual changes. ENT: Negative for sore throat. Cardiovascular: Negative for chest pain. Respiratory: Negative for shortness of breath. Gastrointestinal: As of her abdominal pain as per HPI.   Genitourinary: Negative for dysuria. Musculoskeletal: Negative for back pain. Skin: Negative for rash. Neurological: Negative for headache.  ____________________________________________   PHYSICAL EXAM:  VITAL SIGNS: ED  Triage Vitals  Enc Vitals Group     BP 11/05/17 0652 (!) 170/85     Pulse Rate 11/05/17 0652 96     Resp 11/05/17 0652 20     Temp 11/05/17 0652 98.2 F (36.8 C)     Temp Source 11/05/17 0652 Oral     SpO2 11/05/17 0652 97 %     Weight 11/05/17 0653 177 lb 4 oz (80.4 kg)     Height 11/05/17 0653 5\' 3"  (1.6 m)     Head Circumference --      Peak Flow --      Pain Score 11/05/17 0653 8     Pain Loc --      Pain Edu? --      Excl. in [place]? --      Constitutional: Alert and oriented.  HEENT      Head: Normocephalic and atraumatic.      Eyes: Conjunctivae are normal. Pupils equal and round.       Ears:         Nose: No congestion/rhinnorhea.      Mouth/Throat: Mucous membranes are mildly dry.      Neck: No stridor. Cardiovascular/Chest: Normal rate, regular rhythm.  No murmurs, rubs, or gallops. Respiratory: Normal respiratory effort without tachypnea nor retractions. Breath sounds are clear and equal bilaterally. No wheezes/rales/rhonchi. Gastrointestinal: Soft. No distention, no guarding, no rebound.  Moderate to severe pain in the epigastrium and upper abdomen, no lower abdominal tenderness. Genitourinary/rectal:Deferred Musculoskeletal: Nontender with normal range of motion in all extremities. No joint effusions.  No lower extremity tenderness.  No edema. Neurologic:  Normal speech and language. No gross or focal neurologic deficits are appreciated. Skin:  Skin is warm, dry and intact. No rash noted. Psychiatric: Mood and affect are normal. Speech and behavior are normal. Patient exhibits appropriate insight and judgment.   ____________________________________________  LABS (pertinent positives/negatives) I, Lisa Roca, MD the attending physician have reviewed the labs noted below.  Labs Reviewed  COMPREHENSIVE METABOLIC PANEL - Abnormal; Notable for the following components:      Result Value   Glucose, Bld 220 (*)    AST 48 (*)    Alkaline Phosphatase 153 (*)    All  other components within normal limits  CBC - Abnormal; Notable for the following components:   Hemoglobin 11.5 (*)    HCT 34.0 (*)    RDW 15.2 (*)    Platelets 545 (*)    All other components within normal limits  URINALYSIS, COMPLETE (UACMP) WITH MICROSCOPIC - Abnormal; Notable for the following components:   Color, Urine STRAW (*)    APPearance CLEAR (*)    Bacteria, UA RARE (*)    All other components within normal limits  LIPASE, BLOOD  TROPONIN  I    ____________________________________________    EKG I, Lisa Roca, MD, the attending physician have personally viewed and interpreted all ECGs.  89 bpm.  Normal sinus rhythm.  Narrow QRS.  Normal axis.  Normal ST and T wave ____________________________________________  RADIOLOGY   None __________________________________________  PROCEDURES  Procedure(s) performed: None  Procedures  Critical Care performed: None   ____________________________________________  ED COURSE / ASSESSMENT AND PLAN  Pertinent labs & imaging results that were available during my care of the patient were reviewed by me and considered in my medical decision making (see chart for details).    Patient looks just miserable, still complaining of significant epigastric pain similar to prior episode of pancreatitis.  She is discharged in the hospital less than a week ago after prolonged course for inability to tolerate p.o. and uncontrolled pain and nausea associated with pancreatitis due to hypertriglyceridemia.  States that she has not been doing too well at home but that over the last 12 hours got more nausea, more pain and vomiting diarrhea.  She looks mildly dehydrated.  She is continuing to be in pain.  I will treat symptomatically, and check blood test initially.  Reexamination around 9:45 AM, patient looks much better after liter fluid.  Still has a little bit of burning, will give GI cocktail.  Laboratory studies reviewed with  patient, lipase is normal today, elect lites are normal, glucose is slightly elevated at 220 but no evidence of DKA.  No evident white blood cell count.  Hemoglobin is 11.5.  Clinically it seems at this point that her symptoms are most likely GERD/gastritis rather than pancreatitis.  We discussed that it seems like she is probably able to go home today and she is comfortable with this.  She states that the [facility] did help, but she is out of that.  I am going to go ahead and give her a short course, as well as we discussed treatment over-the-counter medications for acid reflux/GERD.      CONSULTATIONS:   None   Patient / Family / Caregiver informed of clinical course, medical decision-making process, and agree with plan.   I discussed return precautions, follow-up instructions, and discharge instructions with patient and/or family.  Discharge Instructions : You are evaluated for upper abdominal pain and vomiting and although no certain cause was found, your exam and evaluation are overall reassuring in the emergency department today.  Return to emergency department immediately for any worsening condition including worsening or uncontrolled pain, vomiting blood, black or bloody stools, or unable to tolerate food and drink, concern for dehydration such as dry mouth or not making urine, or any fever or altered mental status.    ___________________________________________   FINAL CLINICAL IMPRESSION(S) / ED DIAGNOSES   Final diagnoses:  Acute superficial gastritis without hemorrhage  Gastroesophageal reflux disease, esophagitis presence not specified  Non-intractable vomiting, presence of nausea not specified, unspecified vomiting type      ___________________________________________         Note: This dictation was prepared with Dragon dictation. Any transcriptional errors that result from this process are unintentional    Lisa Roca, MD 11/05/17 1010

## 2017-11-18 ENCOUNTER — Telehealth: Payer: Self-pay | Admitting: *Deleted

## 2017-11-18 NOTE — Telephone Encounter (Signed)
This CM informed by Dannette Barbara CMA that patient requested call back regarding some medicaid questions. This CM left VM for patient

## 2018-03-19 IMAGING — MR MR LUMBAR SPINE W/O CM
4 of 5 series · 26 of 48 positions shown · non-contrast
Comparison: None.

CLINICAL DATA: Fell down stairs March 2015. LBP with bilateral
leg pain. Right leg worse. Pain upper leg mainly. Leg weakness worse
after sitting or standing. Problems worse since fall 2359.

EXAM:
MRI LUMBAR SPINE WITHOUT CONTRAST
TECHNIQUE: Multiplanar, multisequence MR imaging of the lumbar spine was
performed. No intravenous contrast was administered.

[Series 2: T2 · sagittal · 4.0mm · 0.81mm/px · 6 of 15 slices shown (1 of 2)]
[im 1/15]
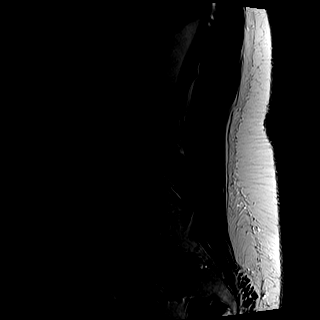
[im 3/15]
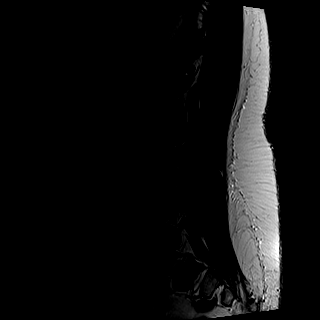
[im 6/15]
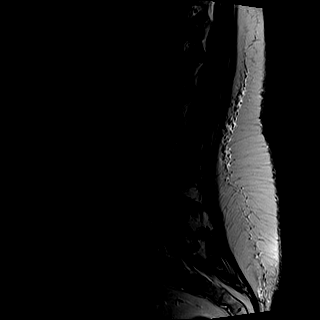
[im 9/15]
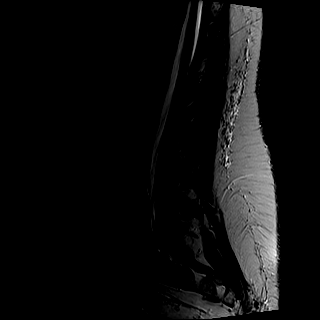
[im 12/15]
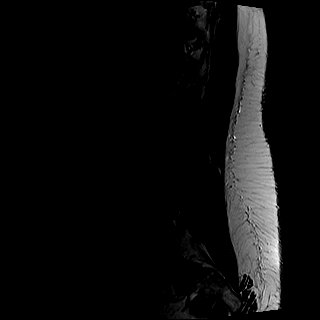
[im 15/15]
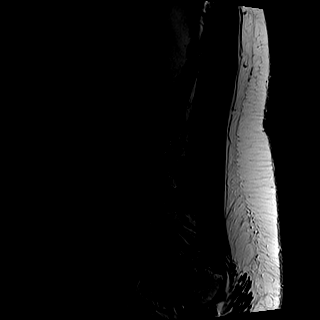

[Series 3: T1 · sagittal · 4.0mm · 0.41mm/px · 6 of 15 slices shown (1 of 2)]
[im 1/15]
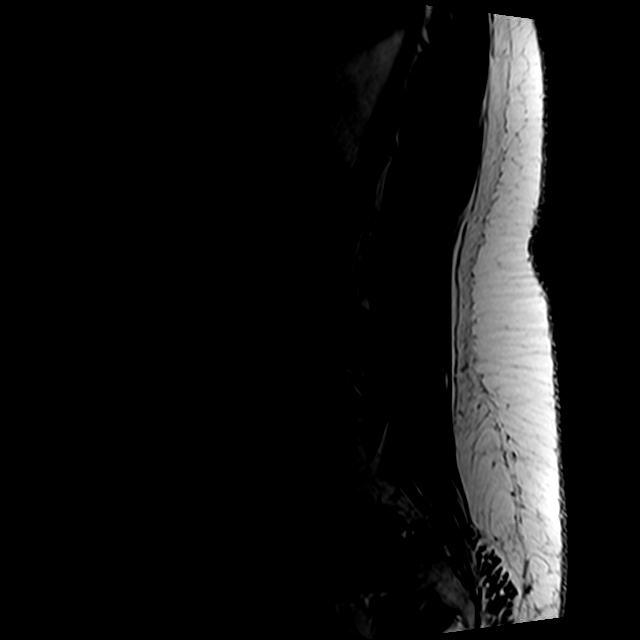
[im 3/15]
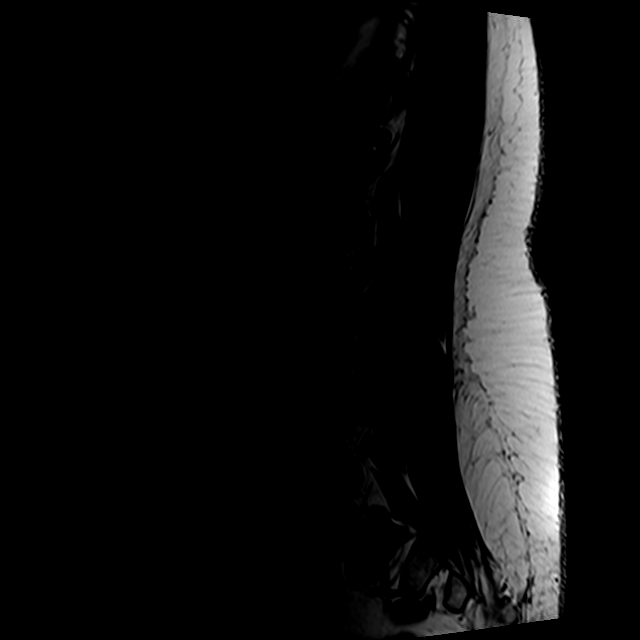
[im 6/15]
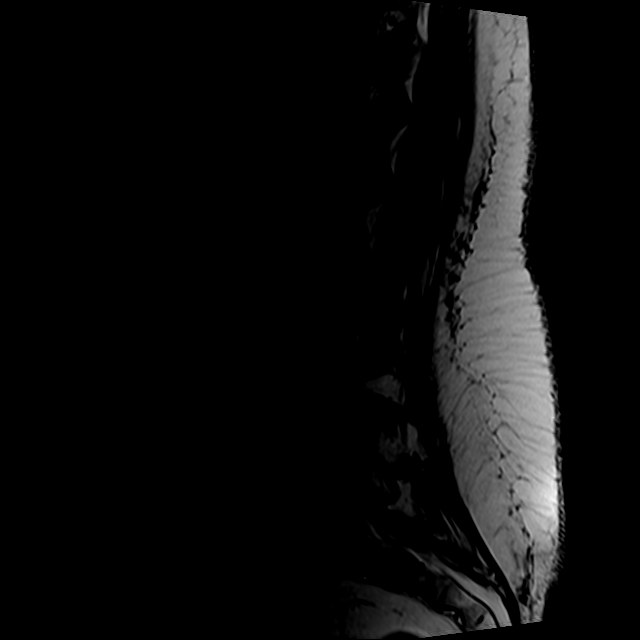
[im 9/15]
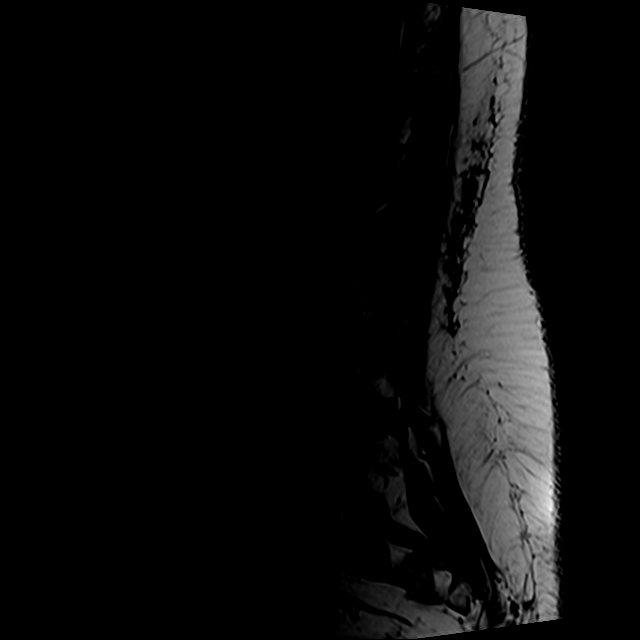
[im 12/15]
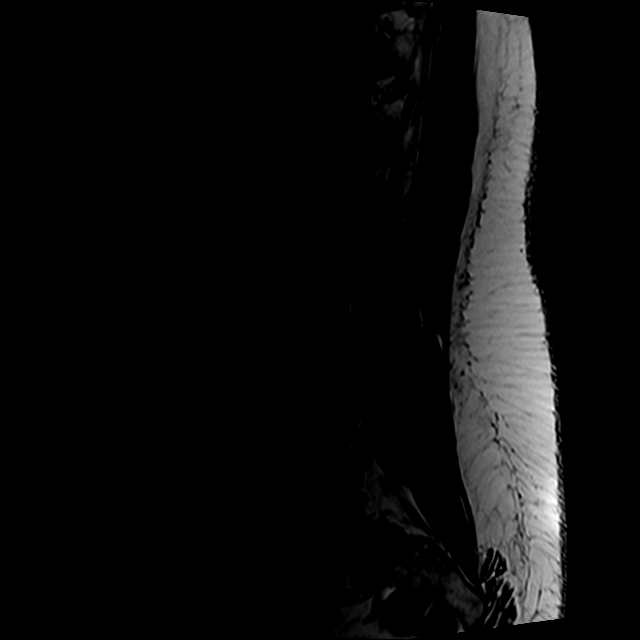
[im 15/15]
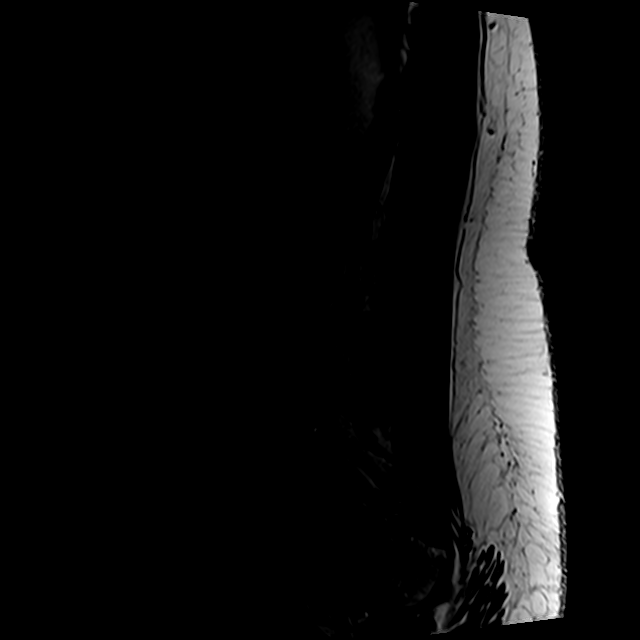

[Series 5: T2 · axial · 4.0mm · 0.78mm/px · z∈[-65,+133]mm · 9 of 36 slices shown (2 of 2)]
[im 1/36]
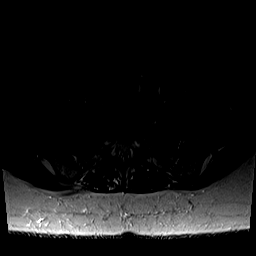
[im 6/36]
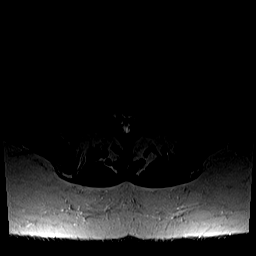
[im 11/36]
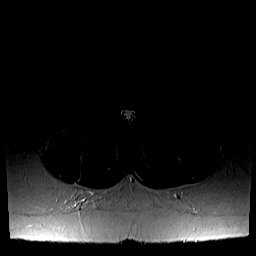
[im 16/36]
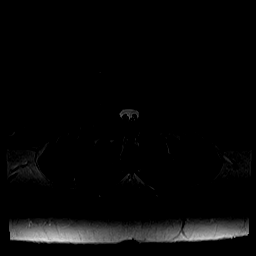
[im 18/36]
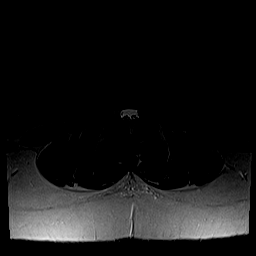
[im 21/36]
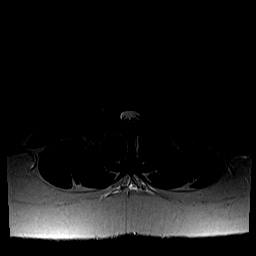
[im 26/36]
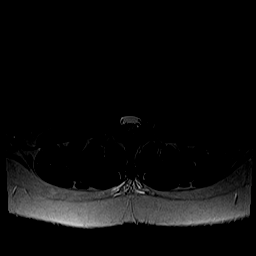
[im 31/36]
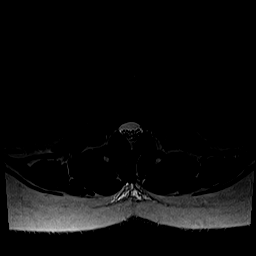
[im 36/36]
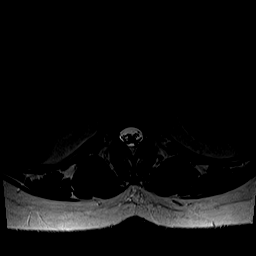

[Series 6: T1 · axial · 4.0mm · 0.39mm/px · z∈[-65,+108]mm · 5 of 36 slices shown (2 of 2)]
[im 1/36]
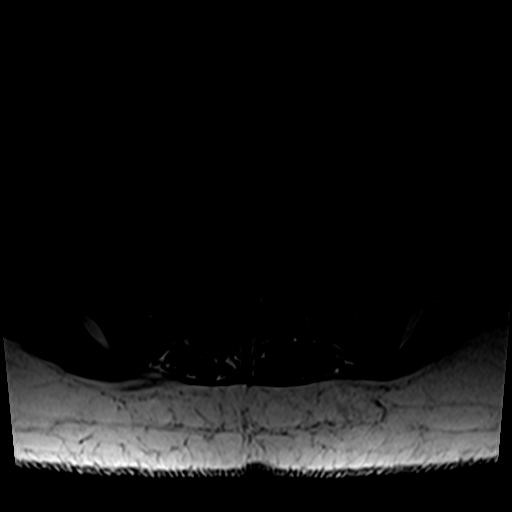
[im 6/36]
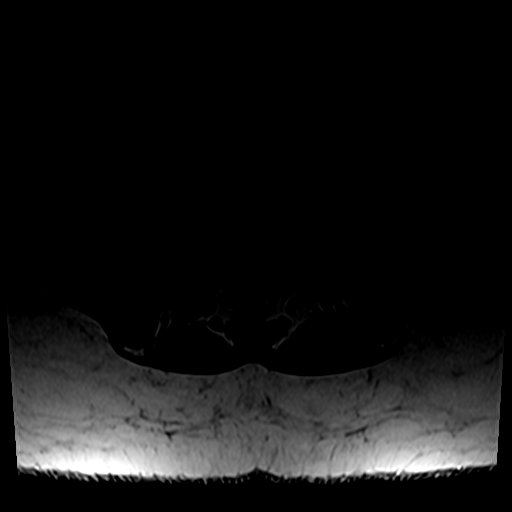
[im 11/36]
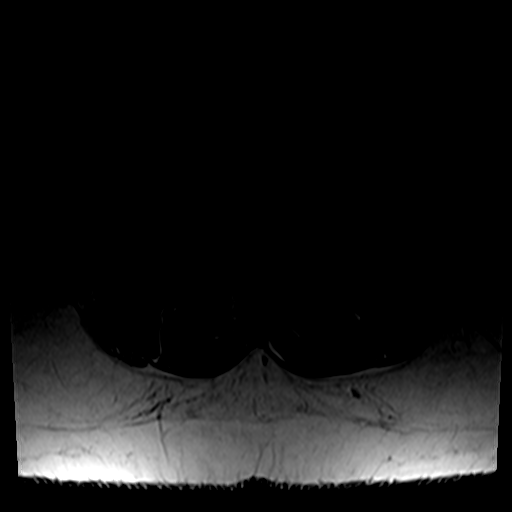
[im 18/36]
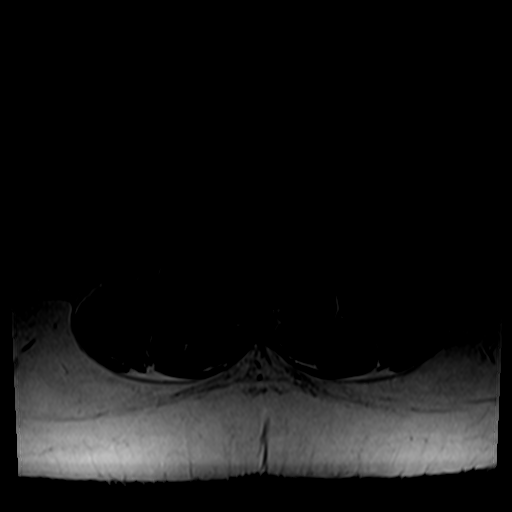
[im 31/36]
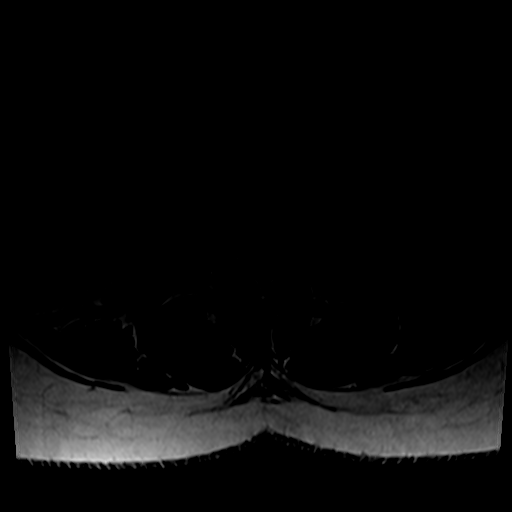

[26 of 48 positions shown; findings below may reference images not displayed]

FINDINGS: Segmentation:  Standard.

Alignment:  Physiologic.

Vertebrae:  No fracture, evidence of discitis, or bone lesion.

Conus medullaris: Extends to the T12 level and appears normal.

Paraspinal and other soft tissues: Negative.

Disc levels:

Disc spaces: Disc desiccation at L5-S1.

T12-L1: No significant disc bulge. No evidence of neural foraminal
stenosis. No central canal stenosis.

L1-L2: No significant disc bulge. No evidence of neural foraminal
stenosis. No central canal stenosis.

L2-L3: No significant disc bulge. No evidence of neural foraminal
stenosis. No central canal stenosis.

L3-L4: No significant disc bulge. No evidence of neural foraminal
stenosis. No central canal stenosis.

L4-L5: No significant disc bulge. No evidence of neural foraminal
stenosis. No central canal stenosis. Mild bilateral facet
arthropathy.

L5-S1: Broad-based disc bulge with a small central disc protrusion.
Mild bilateral facet arthropathy. No evidence of neural foraminal
stenosis. No central canal stenosis.
IMPRESSION: 1. At L5-S1 there is a broad-based disc bulge with a small central
disc protrusion. Mild bilateral facet arthropathy.
2. At L4-5 there is mild bilateral facet arthropathy.

## 2018-08-16 DIAGNOSIS — Z0289 Encounter for other administrative examinations: Secondary | ICD-10-CM

## 2018-10-28 ENCOUNTER — Other Ambulatory Visit: Payer: Self-pay | Admitting: Family Medicine

## 2018-10-28 DIAGNOSIS — N6310 Unspecified lump in the right breast, unspecified quadrant: Secondary | ICD-10-CM

## 2018-11-04 ENCOUNTER — Other Ambulatory Visit: Payer: Self-pay

## 2018-11-14 ENCOUNTER — Ambulatory Visit
Admission: RE | Admit: 2018-11-14 | Discharge: 2018-11-14 | Disposition: A | Discharge status: Home / Self Care | Payer: Medicare Other | Source: Ambulatory Visit | Attending: Family Medicine | Admitting: Family Medicine

## 2018-11-14 DIAGNOSIS — N6314 Unspecified lump in the right breast, lower inner quadrant: Secondary | ICD-10-CM | POA: Insufficient documentation

## 2018-11-14 DIAGNOSIS — N6310 Unspecified lump in the right breast, unspecified quadrant: Secondary | ICD-10-CM

## 2018-11-15 NOTE — Progress Notes (Signed)
Patient's Name: Kelly Rollins  MRN: [id number]  Referring Provider: No ref. provider found  DOB: [birth year]  PCP: [name], PA-C  DOS: 11/16/2018  Note by: Gaspar Cola, MD  Service setting: Ambulatory outpatient  Attending: Gaspar Cola, MD  Location: [facility] (AMB) Pain Management Facility  Specialty: Interventional Pain Management  Patient type: Established   Primary Reason(s) for Visit: Evaluation of chronic illnesses with exacerbation, or progression (Level of risk: moderate) CC: Back Pain (low) and Hip Pain (left)  HPI  Kelly Rollins is a 48 y.o. year old, female patient, who comes today for a follow-up evaluation. She has TOBACCO ABUSE; GERD; Constipation; Pure hypercholesterolemia; Hyperlipidemia; Fatty liver disease; Anxiety; Obesity; Essential hypertriglyceridemia; Long term current use of opiate analgesic; Long term prescription opiate use; Opiate use (25 MME/Day); Encounter for therapeutic drug level monitoring; Encounter for pain management planning; Chronic low back pain (Primary Source of Pain) (Bilateral) (L>R); Lumbar facet syndrome (Bilateral) (L>R); Chronic knee pain [facility] source of pain) (Bilateral) (L>R); Lumbar spondylosis; Neuropathic pain; Neurogenic pain; Myofascial pain; Acute low back pain; Lumbar transverse process fracture (HCC) (Left L1) (03/11/15); Chronic pain syndrome; Depression; Diabetes mellitus type 2, uncomplicated ([place]); Fatty liver; HTN (hypertension), benign; Vitamin D insufficiency; Acute postoperative pain; Lumbar radiculopathy; Chest pain with moderate risk of acute coronary syndrome; DOE (dyspnea on exertion); Intermittent palpitations; Pharmacologic therapy; Disorder of skeletal system; Problems influencing health status; Chronic hip pain (Secondary source of pain) (Bilateral) (L>R); Elevated C-reactive protein (CRP); Elevated sed rate; Spondylosis without myelopathy or radiculopathy, lumbosacral region; Chronic hip pain (Left); and  Acute hip pain (Left) on their problem list. [name] was last seen on Visit date not found. Her primarily concern today is the Back Pain (low) and Hip Pain (left)  Pain Assessment: Location: Lower Back Radiating: left hip Onset: More than a month ago Duration: Chronic pain Quality: Dull, Constant, Sharp Severity: 4 /10 (subjective, self-reported pain score)  Note: Reported level is inconsistent with clinical observations. Clinically the patient looks like a 2/10 A 2/10 is viewed as "Mild to Moderate" and described as noticeable and distracting. Impossible to hide from other people. More frequent flare-ups. Still possible to adapt and function close to normal. It can be very annoying and may have occasional stronger flare-ups. With discipline, patients may get used to it and adapt. [name] does not seem to understand the use of our objective pain scale When using our objective Pain Scale, levels between 6 and 10/10 are said to belong in an emergency room, as it progressively worsens from a 6/10, described as severely limiting, requiring emergency care not usually available at an outpatient pain management facility. At a 6/10 level, communication becomes difficult and requires great effort. Assistance to reach the emergency department may be required. Facial flushing and profuse sweating along with potentially dangerous increases in heart rate and blood pressure will be evident. Timing: Constant Modifying factors: rest, aleve, heat BP: (!) 143/69  HR: 86  The patient was last seen on 05/19/2017 and had not returned secondary to insurance problems.  She comes in today indicating that she is experiencing low back pain and left hip pain and would like for [place] to resume interventional treatments since she has regained insurance.  A prior lumbar MRI had demonstrated clear evidence of bilateral lumbar facet arthropathy from L3-4 down.  Further details on both, my assessment(s), as well as the proposed  treatment plan, please see below.  Pharmacotherapy Assessment  Analgesic: No opioid analgesic prescriptions from  our practice.  MME/day:68m/day.   Monitoring: Pharmacotherapy: No side-effects or adverse reactions reported. [place] PMP: PDMP reviewed during this encounter.       Compliance: No problems identified. Effectiveness: Clinically acceptable. Plan: Refer to "POC".  UDS:  Summary  Date Value Ref Range Status  01/18/2017 FINAL  Final    Comment:    ==================================================================== TOXASSURE SELECT 13 (MW) ==================================================================== Test                             Result       Flag       Units Drug Absent but Declared for Prescription Verification   Oxycodone                      Not Detected UNEXPECTED ng/mg creat ==================================================================== Test                      Result    Flag   Units      Ref Range   Creatinine              209              mg/dL      >=20 ==================================================================== Declared Medications:  The flagging and interpretation on this report are based on the  following declared medications.  Unexpected results may arise from  inaccuracies in the declared medications.  **Note: The testing scope of this panel includes these medications:  Oxycodone  **Note: The testing scope of this panel does not include following  reported medications:  Acetaminophen  Albuterol  Aspirin  Caffeine  Gabapentin  Ibuprofen  Magnesium  Metformin  Metoprolol (Toprol)  Multivitamin (MVI)  Ondansetron (Zofran)  Venlafaxine (Effexor)  Vitamin C  Vitamin D3 ==================================================================== For clinical consultation, please call ([phone] ====================================================================    Laboratory Chemistry Profile   Screening Lab Results   Component Value Date   MRSAPCR NEGATIVE 10/15/2017   HIV Non Reactive 10/15/2017    Inflammation (CRP: Acute Phase) (ESR: Chronic Phase) Lab Results  Component Value Date   CRP 5.6 (H) 05/06/2017   ESRSEDRATE 33 (H) 05/06/2017   LATICACIDVEN 2.0 (HBruno 02/03/2017                         Rheumatology No results found.  Renal Lab Results  Component Value Date   BUN 10 11/05/2017   CREATININE 0.85 11/05/2017   BCR 14 05/06/2017   GFR 88.95 03/19/2010   GFRAA >60 11/05/2017   GFRNONAA >60 11/05/2017                             Hepatic Lab Results  Component Value Date   AST 48 (H) 11/05/2017   ALT 41 11/05/2017   ALBUMIN 3.7 11/05/2017   ALKPHOS 153 (H) 11/05/2017   AMYLASE 40 04/03/2010   LIPASE 27 11/05/2017                        Electrolytes Lab Results  Component Value Date   NA 137 11/05/2017   K 3.9 11/05/2017   CL 100 11/05/2017   CALCIUM 9.5 11/05/2017   MG 2.1 10/23/2017   PHOS 4.4 10/20/2017  Neuropathy Lab Results  Component Value Date   VELFYBOF75 102 05/06/2017   HGBA1C 14.5 (H) 10/14/2017   HIV Non Reactive 10/15/2017                        CNS No results found.  Bone Lab Results  Component Value Date   25OHVITD1 22 (L) 05/06/2017   25OHVITD2 2.9 05/06/2017   25OHVITD3 19 05/06/2017                         Coagulation Lab Results  Component Value Date   PLT 545 (H) 11/05/2017                        Cardiovascular Lab Results  Component Value Date   CKTOTAL 109 03/18/2013   CKMB 1.7 03/18/2013   TROPONINI <0.03 11/05/2017   HGB 11.5 (L) 11/05/2017   HCT 34.0 (L) 11/05/2017                         ID Lab Results  Component Value Date   HIV Non Reactive 10/15/2017   MRSAPCR NEGATIVE 10/15/2017    Cancer No results found.  Endocrine Lab Results  Component Value Date   TSH 4.300 10/25/2017                        Note: Lab results reviewed.  Imaging Review  Cervical Imaging: Cervical CT  wo contrast:  Results for orders placed during the hospital encounter of 03/11/15  CT Cervical Spine Wo Contrast   Narrative CLINICAL DATA:  Pain following fall  EXAM: CT CERVICAL, THORACIC, AND LUMBAR SPINE WITHOUT CONTRAST  TECHNIQUE: Multidetector CT imaging of the cervical, thoracic and lumbar spine was performed without intravenous contrast. Multiplanar CT image reconstructions were also generated.  COMPARISON:  None.  FINDINGS: CT CERVICAL SPINE FINDINGS  There is no fracture or spondylolisthesis. Prevertebral soft tissues and predental space regions are normal. The disc spaces appear normal. No nerve root edema or effacement. No disc extrusion or stenosis.  The visualized brain parenchyma appears normal. The visualized mastoid air cells are clear. Visualized lung apices are clear.  CT THORACIC SPINE FINDINGS  There is no demonstrable fracture or spondylolisthesis. There is slight mid thoracic dextroscoliosis. There are multiple anterior osteophytes. There is slight disc space narrowing at several levels in the lower thoracic region. There is mild facet hypertrophy at several levels without nerve root edema or effacement. No disc extrusion or stenosis is evident. The visualized lung regions appear clear. No paraspinous lesions are appreciable.  CT LUMBAR SPINE FINDINGS  There is a nondisplaced fracture of the lateral aspect of the left L1 transverse process. No other fracture is evident. There is no spondylolisthesis. The disc spaces appear normal. No paraspinous lesions are identified. No nerve root edema or effacement. No disc extrusions or stenosis.  IMPRESSION: CT cervical spine: No fracture or spondylolisthesis. No appreciable arthropathy.  CT thoracic spine: No fracture or spondylolisthesis. Mild osteoarthritic change at multiple levels. No disc extrusion or stenosis. No nerve root edema or effacement.  CT lumbar spine: Nondisplaced fracture L1  transverse process on the left. No other evidence of fracture. No spondylolisthesis. No appreciable disc space narrowing. No disc extrusion or stenosis.   Electronically Signed   By: Lowella Grip III M.D.   On: 03/11/2015 09:26    Thoracic Imaging:  Thoracic CT wo contrast:  Results for orders placed during the hospital encounter of 03/11/15  CT Thoracic Spine Wo Contrast   Narrative CLINICAL DATA:  Pain following fall  EXAM: CT CERVICAL, THORACIC, AND LUMBAR SPINE WITHOUT CONTRAST  TECHNIQUE: Multidetector CT imaging of the cervical, thoracic and lumbar spine was performed without intravenous contrast. Multiplanar CT image reconstructions were also generated.  COMPARISON:  None.  FINDINGS: CT CERVICAL SPINE FINDINGS  There is no fracture or spondylolisthesis. Prevertebral soft tissues and predental space regions are normal. The disc spaces appear normal. No nerve root edema or effacement. No disc extrusion or stenosis.  The visualized brain parenchyma appears normal. The visualized mastoid air cells are clear. Visualized lung apices are clear.  CT THORACIC SPINE FINDINGS  There is no demonstrable fracture or spondylolisthesis. There is slight mid thoracic dextroscoliosis. There are multiple anterior osteophytes. There is slight disc space narrowing at several levels in the lower thoracic region. There is mild facet hypertrophy at several levels without nerve root edema or effacement. No disc extrusion or stenosis is evident. The visualized lung regions appear clear. No paraspinous lesions are appreciable.  CT LUMBAR SPINE FINDINGS  There is a nondisplaced fracture of the lateral aspect of the left L1 transverse process. No other fracture is evident. There is no spondylolisthesis. The disc spaces appear normal. No paraspinous lesions are identified. No nerve root edema or effacement. No disc extrusions or stenosis.  IMPRESSION: CT cervical spine: No  fracture or spondylolisthesis. No appreciable arthropathy.  CT thoracic spine: No fracture or spondylolisthesis. Mild osteoarthritic change at multiple levels. No disc extrusion or stenosis. No nerve root edema or effacement.  CT lumbar spine: Nondisplaced fracture L1 transverse process on the left. No other evidence of fracture. No spondylolisthesis. No appreciable disc space narrowing. No disc extrusion or stenosis.   Electronically Signed   By: Lowella Grip III M.D.   On: 03/11/2015 09:26    Lumbosacral Imaging: Lumbar MR wo contrast:  Results for orders placed during the hospital encounter of 09/29/16  MR LUMBAR SPINE WO CONTRAST   Narrative CLINICAL DATA:  [place] down stairs February 2017. LBP with bilateral leg pain. Right leg worse. Pain upper leg mainly. Leg weakness worse after sitting or standing. Problems worse since fall 2017.  EXAM: MRI LUMBAR SPINE WITHOUT CONTRAST  TECHNIQUE: Multiplanar, multisequence MR imaging of the lumbar spine was performed. No intravenous contrast was administered.  COMPARISON:  None.  FINDINGS: Segmentation:  Standard.  Alignment:  Physiologic.  Vertebrae:  No fracture, evidence of discitis, or bone lesion.  Conus medullaris: Extends to the T12 level and appears normal.  Paraspinal and other soft tissues: Negative.  Disc levels:  Disc spaces: Disc desiccation at L5-S1.  T12-L1: No significant disc bulge. No evidence of neural foraminal stenosis. No central canal stenosis.  L1-L2: No significant disc bulge. No evidence of neural foraminal stenosis. No central canal stenosis.  L2-L3: No significant disc bulge. No evidence of neural foraminal stenosis. No central canal stenosis.  L3-L4: No significant disc bulge. No evidence of neural foraminal stenosis. No central canal stenosis.  L4-L5: No significant disc bulge. No evidence of neural foraminal stenosis. No central canal stenosis. Mild bilateral  facet arthropathy.  L5-S1: Broad-based disc bulge with a small central disc protrusion. Mild bilateral facet arthropathy. No evidence of neural foraminal stenosis. No central canal stenosis.  IMPRESSION: 1. At L5-S1 there is a broad-based disc bulge with a small central disc protrusion. Mild bilateral facet arthropathy. 2. At  L4-5 there is mild bilateral facet arthropathy.   Electronically Signed   By: Kathreen Devoid   On: 09/29/2016 13:54    Lumbar CT wo contrast:  Results for orders placed during the hospital encounter of 03/11/15  CT Lumbar Spine Wo Contrast   Narrative CLINICAL DATA:  Pain following fall  EXAM: CT CERVICAL, THORACIC, AND LUMBAR SPINE WITHOUT CONTRAST  TECHNIQUE: Multidetector CT imaging of the cervical, thoracic and lumbar spine was performed without intravenous contrast. Multiplanar CT image reconstructions were also generated.  COMPARISON:  None.  FINDINGS: CT CERVICAL SPINE FINDINGS  There is no fracture or spondylolisthesis. Prevertebral soft tissues and predental space regions are normal. The disc spaces appear normal. No nerve root edema or effacement. No disc extrusion or stenosis.  The visualized brain parenchyma appears normal. The visualized mastoid air cells are clear. Visualized lung apices are clear.  CT THORACIC SPINE FINDINGS  There is no demonstrable fracture or spondylolisthesis. There is slight mid thoracic dextroscoliosis. There are multiple anterior osteophytes. There is slight disc space narrowing at several levels in the lower thoracic region. There is mild facet hypertrophy at several levels without nerve root edema or effacement. No disc extrusion or stenosis is evident. The visualized lung regions appear clear. No paraspinous lesions are appreciable.  CT LUMBAR SPINE FINDINGS  There is a nondisplaced fracture of the lateral aspect of the left L1 transverse process. No other fracture is evident. There is  no spondylolisthesis. The disc spaces appear normal. No paraspinous lesions are identified. No nerve root edema or effacement. No disc extrusions or stenosis.  IMPRESSION: CT cervical spine: No fracture or spondylolisthesis. No appreciable arthropathy.  CT thoracic spine: No fracture or spondylolisthesis. Mild osteoarthritic change at multiple levels. No disc extrusion or stenosis. No nerve root edema or effacement.  CT lumbar spine: Nondisplaced fracture L1 transverse process on the left. No other evidence of fracture. No spondylolisthesis. No appreciable disc space narrowing. No disc extrusion or stenosis.   Electronically Signed   By: Lowella Grip III M.D.   On: 03/11/2015 09:26    Sacroiliac Joint Imaging: Sacroiliac Joint DG:  Results for orders placed during the hospital encounter of 05/13/17  DG Si Joints   Narrative CLINICAL DATA:  Bilateral hip pain and pain that radiates into SI joints x 6 months.Pt stated she fell and had a compression fx in 2017  EXAM: [place] - 3+ VIEW  COMPARISON:  None.  FINDINGS: The sacroiliac joint spaces are maintained and there is no evidence of arthropathy. No other bone abnormalities are seen.  IMPRESSION: Negative.   Electronically Signed   By: Lajean Manes M.D.   On: 05/14/2017 08:10    Hip Imaging: Hip-R DG 2-3 views:  Results for orders placed during the hospital encounter of 05/13/17  DG HIP UNILAT W OR W/O PELVIS 2-3 VIEWS RIGHT   Narrative CLINICAL DATA:  Bilateral hip pain and pain that radiates into SI joints x 6 months.Pt stated she fell and had a compression fx in 2017  EXAM: DG HIP (WITH OR WITHOUT PELVIS) 2-3V RIGHT  COMPARISON:  None.  FINDINGS: There is no evidence of hip fracture or dislocation. There is no evidence of arthropathy or other focal bone abnormality.  IMPRESSION: Negative.   Electronically Signed   By: Lajean Manes M.D.   On: 05/14/2017 08:09     Hip-L DG 2-3 views:  Results for orders placed during the hospital encounter of 05/13/17  DG HIP UNILAT W OR  W/O PELVIS 2-3 VIEWS LEFT   Narrative CLINICAL DATA:  Bilateral hip pain and pain that radiates into SI joints x 6 months.Pt stated she fell and had a compression fx in 2017  EXAM: DG HIP (WITH OR WITHOUT PELVIS) 2-3V LEFT  COMPARISON:  None.  FINDINGS: There is no evidence of hip fracture or dislocation. There is no evidence of arthropathy or other focal bone abnormality.  IMPRESSION: Negative.   Electronically Signed   By: Lajean Manes M.D.   On: 05/14/2017 08:09    Complexity Note: Imaging results reviewed. Results shared with Ms. Carbonell, using Layman's terms.                         Meds   Current Outpatient Medications:  .  insulin detemir (LEVEMIR) 100 UNIT/ML injection, Inject 0.1 mLs (10 Units total) into the skin daily., Disp: 10 mL, Rfl: 11 .  metoprolol succinate (TOPROL-XL) 100 MG 24 hr tablet, Take 100 mg by mouth daily. , Disp: , Rfl:  .  omeprazole (PRILOSEC) 20 MG capsule, Take 1 capsule (20 mg total) by mouth daily., Disp: 90 capsule, Rfl: 3 .  ondansetron (ZOFRAN) 4 MG tablet, Take 1 tablet (4 mg total) by mouth every 8 (eight) hours as needed for nausea or vomiting., Disp: 10 tablet, Rfl: 0 .  senna-docusate (SENOKOT-S) 8.6-50 MG tablet, Take 1 tablet by mouth 2 (two) times daily., Disp: 30 tablet, Rfl: 0 .  venlafaxine XR (EFFEXOR-XR) 37.5 MG 24 hr capsule, Take 37.5 mg by mouth daily., Disp: , Rfl: 11 .  gabapentin (NEURONTIN) 300 MG capsule, Take 1 capsule (300 mg total) by mouth at bedtime. (Patient not taking: Reported on 10/18/2017), Disp: 30 capsule, Rfl: 5  ROS  Constitutional: Denies any fever or chills Gastrointestinal: No reported hemesis, hematochezia, vomiting, or acute GI distress Musculoskeletal: Denies any acute onset joint swelling, redness, loss of ROM, or weakness Neurological: No reported episodes of acute onset apraxia,  aphasia, dysarthria, agnosia, amnesia, paralysis, loss of coordination, or loss of consciousness  Allergies  Ms. Swann is allergic to atorvastatin; levofloxacin; morphine; penicillins; and tramadol hcl.  [place]  Drug: Ms. Persichetti  reports no history of drug use. Alcohol:  reports current alcohol use. Tobacco:  reports that she quit smoking about 4 years ago. Her smoking use included cigarettes. She has never used smokeless tobacco. Medical:  has a past medical history of ABDOMINAL PAIN OTHER SPECIFIED SITE (02/21/2009), ABDOMINAL PAIN-LUQ (04/17/2009), Anal fissure, Asthma, Diabetes mellitus without complication ([place]), WEXHBZJI(967.8), HEMATOCHEZIA (04/17/2009), Hypertension, IBS (irritable bowel syndrome), Migraine headache (11/06/2008), OVARIAN CYST, RIGHT (02/21/2009), PALPITATIONS, OCCASIONAL (04/17/2010), and Pancreatitis (10/14/2017). Surgical: Ms. Bahr  has a past surgical history that includes Cesarean section; Vaginal hysterectomy; Cholecystectomy; Breast reduction surgery; Breast excisional biopsy (Left, 1997); and Reduction mammaplasty (Bilateral, 1997). Family: family history includes Cancer in her mother; Heart disease in her father; Hyperlipidemia in her father and mother; Hypertension in her father and mother.  Constitutional Exam  General appearance: Well nourished, well developed, and well hydrated. In no apparent acute distress Vitals:   11/16/18 0846  BP: (!) 143/69  Pulse: 86  Resp: 20  Temp: 98.1 F (36.7 C)  TempSrc: Oral  SpO2: 100%  Weight: 182 lb (82.6 kg)  Height: '5\' 4"'  (1.626 m)   BMI Assessment: Estimated body mass index is 31.24 kg/m as calculated from the following:   Height as of this encounter: '5\' 4"'  (1.626 m).   Weight as of this encounter:  182 lb (82.6 kg).  BMI interpretation table: BMI level Category Range association with higher incidence of chronic pain  <18 kg/m2 Underweight   18.5-24.9 kg/m2 Ideal body weight   25-29.9 kg/m2 Overweight Increased  incidence by 20%  30-34.9 kg/m2 Obese (Class I) Increased incidence by 68%  35-39.9 kg/m2 Severe obesity (Class II) Increased incidence by 136%  >40 kg/m2 Extreme obesity (Class III) Increased incidence by 254%   Patient's current BMI Ideal Body weight  Body mass index is 31.24 kg/m. Ideal body weight: 54.7 kg (120 lb 9.5 oz) Adjusted ideal body weight: 65.8 kg (145 lb 2.5 oz)   BMI Readings from Last 4 Encounters:  11/16/18 31.24 kg/m  11/05/17 31.40 kg/m  10/15/17 31.40 kg/m  05/24/17 32.75 kg/m   Wt Readings from Last 4 Encounters:  11/16/18 182 lb (82.6 kg)  11/05/17 177 lb 4 oz (80.4 kg)  10/15/17 177 lb 4 oz (80.4 kg)  05/24/17 190 lb 12.8 oz (86.5 kg)  Psych/Mental status: Alert, oriented x 3 (person, place, & time)       Eyes: PERLA Respiratory: No evidence of acute respiratory distress  Cervical Spine Area Exam  Skin & Axial Inspection: No masses, redness, edema, swelling, or associated skin lesions Alignment: Symmetrical Functional ROM: Unrestricted ROM      Stability: No instability detected Muscle Tone/Strength: Functionally intact. No obvious neuro-muscular anomalies detected. Sensory (Neurological): Unimpaired Palpation: No palpable anomalies              Upper Extremity (UE) Exam    Side: Right upper extremity  Side: Left upper extremity  Skin & Extremity Inspection: Skin color, temperature, and hair growth are WNL. No peripheral edema or cyanosis. No masses, redness, swelling, asymmetry, or associated skin lesions. No contractures.  Skin & Extremity Inspection: Skin color, temperature, and hair growth are WNL. No peripheral edema or cyanosis. No masses, redness, swelling, asymmetry, or associated skin lesions. No contractures.  Functional ROM: Unrestricted ROM          Functional ROM: Unrestricted ROM          Muscle Tone/Strength: Functionally intact. No obvious neuro-muscular anomalies detected.  Muscle Tone/Strength: Functionally intact. No obvious  neuro-muscular anomalies detected.  Sensory (Neurological): Unimpaired          Sensory (Neurological): Unimpaired          Palpation: No palpable anomalies              Palpation: No palpable anomalies              Provocative Test(s):  Phalen's test: deferred Tinel's test: deferred Apley's scratch test (touch opposite shoulder):  Action 1 (Across chest): deferred Action 2 (Overhead): deferred Action 3 (LB reach): deferred   Provocative Test(s):  Phalen's test: deferred Tinel's test: deferred Apley's scratch test (touch opposite shoulder):  Action 1 (Across chest): deferred Action 2 (Overhead): deferred Action 3 (LB reach): deferred    Thoracic Spine Area Exam  Skin & Axial Inspection: No masses, redness, or swelling Alignment: Symmetrical Functional ROM: Unrestricted ROM Stability: No instability detected Muscle Tone/Strength: Functionally intact. No obvious neuro-muscular anomalies detected. Sensory (Neurological): Unimpaired Muscle strength & Tone: No palpable anomalies  Lumbar Spine Area Exam  Skin & Axial Inspection: No masses, redness, or swelling Alignment: Symmetrical Functional ROM: Decreased ROM       Stability: No instability detected Muscle Tone/Strength: Guarding detected Sensory (Neurological): Movement-associated pain Palpation: Complains of area being tender to palpation       Provocative Tests:  Hyperextension/rotation test: (+) bilaterally for facet joint pain. Lumbar quadrant test (Kemp's test): (+) bilaterally for facet joint pain. Lateral bending test: deferred today       Patrick's Maneuver: (+) for bilateral S-I arthralgia and for left hip arthralgia FABER* test: (+) for bilateral S-I arthralgia and for left hip arthralgia S-I anterior distraction/compression test: (+) bilateral S-I arthralgia/arthropathy S-I lateral compression test: deferred today         S-I Thigh-thrust test: deferred today         S-I Gaenslen's test: deferred today          *(Flexion, ABduction and External Rotation)  Gait & Posture Assessment  Ambulation: Unassisted Gait: Antalgic Posture: Difficulty standing up straight, due to pain   Lower Extremity Exam    Side: Right lower extremity  Side: Left lower extremity  Stability: No instability observed          Stability: No instability observed          Skin & Extremity Inspection: Skin color, temperature, and hair growth are WNL. No peripheral edema or cyanosis. No masses, redness, swelling, asymmetry, or associated skin lesions. No contractures.  Skin & Extremity Inspection: Skin color, temperature, and hair growth are WNL. No peripheral edema or cyanosis. No masses, redness, swelling, asymmetry, or associated skin lesions. No contractures.  Functional ROM: Unrestricted ROM                  Functional ROM: Unrestricted ROM                  Muscle Tone/Strength: Able to Toe-walk & Heel-walk without problems  Muscle Tone/Strength: Able to Toe-walk & Heel-walk without problems  Sensory (Neurological): Unimpaired        Sensory (Neurological): Unimpaired        DTR: Patellar: deferred today Achilles: deferred today Plantar: deferred today  DTR: Patellar: deferred today Achilles: deferred today Plantar: deferred today  Palpation: No palpable anomalies  Palpation: No palpable anomalies   Assessment   Status Diagnosis  Persistent Worsened Worsened 1. Chronic pain syndrome   2. Chronic low back pain (Primary Source of Pain) (Bilateral) (L>R)   3. Chronic hip pain (Secondary source of pain) (Bilateral) (L>R)   4. Chronic knee pain [facility] source of pain) (Bilateral) (L>R)   5. Pharmacologic therapy   6. Lumbar facet syndrome (Bilateral) (L>R)   7. Chronic hip pain (Left)   8. Acute hip pain (Left)   9. Disorder of skeletal system   10. Problems influencing health status   11. Vitamin D insufficiency   12. Elevated C-reactive protein (CRP)   13. Elevated sed rate      Updated Problems: Problem   Chronic hip pain (Left)  Acute hip pain (Left)  Pharmacologic Therapy  Disorder of Skeletal System  Problems Influencing Health Status  Pancreatitis (Resolved)    Plan of Care  Pharmacotherapy (Medications Ordered): No orders of the defined types were placed in this encounter.  Medications administered today: Courtney Heys had no medications administered during this visit.  Orders:  Orders Placed This Encounter  Procedures  . LUMBAR FACET(MEDIAL BRANCH NERVE BLOCK) MBNB    Standing Status:   Future    Standing Expiration Date:   12/17/2018    Scheduling Instructions:     Side: Bilateral     Level: L3-4, L4-5, & L5-S1 Facets (L2, L3, L4, L5, & S1 Medial Branch Nerves)     Sedation: With Sedation.     Timeframe: ASAP  Order Specific Question:   Where will this procedure be performed?    Answer:   [facility]  . DG HIP UNILAT W OR W/O PELVIS 2-3 VIEWS LEFT    Standing Status:   Future    Standing Expiration Date:   11/16/2019    Scheduling Instructions:     Please describe any evidence of DJD, such as joint narrowing, asymmetry, cysts, or any anomalies in bone density, production, or erosion.    Order Specific Question:   Reason for Exam (SYMPTOM  OR DIAGNOSIS REQUIRED)    Answer:   Left hip pain/arthralgia    Order Specific Question:   Is the patient pregnant?    Answer:   No    Order Specific Question:   Preferred imaging location?    Answer:   [facility]    Order Specific Question:   Call Results- Best Contact Number?    Answer:   [phone] (Pain Clinic facility) (Dr. Dossie Arbour)  . Comp. Metabolic Panel (12)    With GFR. Indications: Chronic Pain Syndrome (G89.4) & Pharmacotherapy (M54.650)    Order Specific Question:   Has the patient fasted?    Answer:   No    Order Specific Question:   CC Results    Answer:   PCP-NURSE [[id number]]  . Magnesium    Indication: Pharmacologic therapy (C12.751)    Order Specific Question:   CC Results     Answer:   PCP-NURSE [[id number]]  . Vitamin B12    Indication: Pharmacologic therapy (B44.967).    Order Specific Question:   CC Results    Answer:   PCP-NURSE [[id number]]  . Sedimentation rate    Indication: Disorder of skeletal system (M89.9)    Order Specific Question:   CC Results    Answer:   PCP-NURSE [[id number]]  . 25-Hydroxyvitamin D Lcms D2+D3    Indication: Disorder of skeletal system (M89.9).    Order Specific Question:   CC Results    Answer:   PCP-NURSE [[id number]]  . C-reactive protein    Indication: Problems influencing health status (Z78.9)    Order Specific Question:   CC Results    Answer:   PCP-NURSE [[id number]]    Lab Orders     Comp. Metabolic Panel (12)     Magnesium     Vitamin B12     Sedimentation rate     25-Hydroxyvitamin D Lcms D2+D3     C-reactive protein  Imaging Orders     DG HIP UNILAT W OR W/O PELVIS 2-3 VIEWS LEFT Referral Orders  No referral(s) requested today   Planned follow-up:   Return for Procedure (w/ sedation): (B) L-FCT BLK #3.      Interventional management options: Planned, scheduled, and/or pending:   Diagnostic bilateral lumbar facet block #3   This will be followed at a later time by a diagnostic left IA hip joint injection #1    Considering:   Diagnostic bilateral IA hip joint injection  Diagnostic left IA hip joint injection #1    Palliative PRN treatment(s):   Diagnostic bilateral hip joint injection  Palliative bilateral lumbar facet block #3  Palliative left L1 transverse process injection #2  Palliative/Therapeutic righ lumbar facet RFA #2 (last done 06/23/2016) Palliative/Therapeutic left lumbar facet RFA #2 (last done 06/08/2016)    Recent Visits No visits were found meeting these conditions.  Showing recent visits within past 90 days and meeting all other requirements   Today's Visits Date Type Provider Dept  11/16/18 Office Visit Milinda Pointer, MD [facility]  Showing today's visits and meeting all other  requirements   Future Appointments No visits were found meeting these conditions.  Showing future appointments within next 90 days and meeting all other requirements   Primary Care Physician: [name], PA-C Location: [facility] Note by: Gaspar Cola, MD Date: 11/16/2018; Time: 9:49 AM  Note: This dictation was prepared with Dragon dictation. Any transcriptional errors that may result from this process are unintentional.

## 2018-11-16 ENCOUNTER — Encounter: Payer: Self-pay | Admitting: Pain Medicine

## 2018-11-16 ENCOUNTER — Ambulatory Visit: Payer: Medicare Other | Attending: Pain Medicine | Admitting: Pain Medicine

## 2018-11-16 ENCOUNTER — Other Ambulatory Visit: Payer: Self-pay

## 2018-11-16 VITALS — BP 143/69 | HR 86 | Temp 98.1°F | Resp 20 | Ht 64.0 in | Wt 182.0 lb

## 2018-11-16 DIAGNOSIS — G894 Chronic pain syndrome: Secondary | ICD-10-CM | POA: Diagnosis present

## 2018-11-16 DIAGNOSIS — M25552 Pain in left hip: Secondary | ICD-10-CM | POA: Insufficient documentation

## 2018-11-16 DIAGNOSIS — M5137 Other intervertebral disc degeneration, lumbosacral region: Secondary | ICD-10-CM | POA: Insufficient documentation

## 2018-11-16 DIAGNOSIS — R7982 Elevated C-reactive protein (CRP): Secondary | ICD-10-CM | POA: Diagnosis present

## 2018-11-16 DIAGNOSIS — M25551 Pain in right hip: Secondary | ICD-10-CM

## 2018-11-16 DIAGNOSIS — Z789 Other specified health status: Secondary | ICD-10-CM | POA: Diagnosis present

## 2018-11-16 DIAGNOSIS — E559 Vitamin D deficiency, unspecified: Secondary | ICD-10-CM | POA: Diagnosis present

## 2018-11-16 DIAGNOSIS — M545 Low back pain, unspecified: Secondary | ICD-10-CM

## 2018-11-16 DIAGNOSIS — M25562 Pain in left knee: Secondary | ICD-10-CM | POA: Diagnosis present

## 2018-11-16 DIAGNOSIS — Z79899 Other long term (current) drug therapy: Secondary | ICD-10-CM | POA: Diagnosis present

## 2018-11-16 DIAGNOSIS — M25561 Pain in right knee: Secondary | ICD-10-CM | POA: Diagnosis not present

## 2018-11-16 DIAGNOSIS — G8929 Other chronic pain: Secondary | ICD-10-CM | POA: Diagnosis present

## 2018-11-16 DIAGNOSIS — M899 Disorder of bone, unspecified: Secondary | ICD-10-CM | POA: Insufficient documentation

## 2018-11-16 DIAGNOSIS — R700 Elevated erythrocyte sedimentation rate: Secondary | ICD-10-CM | POA: Insufficient documentation

## 2018-11-16 DIAGNOSIS — M47816 Spondylosis without myelopathy or radiculopathy, lumbar region: Secondary | ICD-10-CM

## 2018-11-16 NOTE — Progress Notes (Signed)
Patient's Name: Kelly Rollins  MRN: [id number]  Referring Provider: [name],*  DOB: [birth year]  PCP: [name], PA-C  DOS: 11/17/2018  Note by: Gaspar Cola, MD  Service setting: Ambulatory outpatient  Specialty: Interventional Pain Management  Patient type: Established  Location: [facility] (AMB) Pain Management Facility  Visit type: Interventional Procedure   Primary Reason for Visit: Interventional Pain Management Treatment. CC: Back Pain (lumbar bilateral )  Procedure:          Anesthesia, Analgesia, Anxiolysis:  Type: Lumbar Facet, Medial Branch Block(s) #3  Primary Purpose: Diagnostic Region: Posterolateral Lumbosacral Spine Level: L2, L3, L4, L5, & S1 Medial Branch Level(s). Injecting these levels blocks the L3-4, L4-5, and L5-S1 lumbar facet joints. Laterality: Bilateral  Type: Moderate (Conscious) Sedation combined with Local Anesthesia Indication(s): Analgesia and Anxiety Route: Intravenous (IV) IV Access: Secured Sedation: Meaningful verbal contact was maintained at all times during the procedure  Local Anesthetic: Lidocaine 1-2%  Position: Prone   Indications: 1. Lumbar facet syndrome (Bilateral) (L>R)   2. Spondylosis without myelopathy or radiculopathy, lumbosacral region   3. DDD (degenerative disc disease), lumbosacral   4. Chronic low back pain (Primary Source of Pain) (Bilateral) (L>R)   5. Lumbar transverse process fracture (HCC) (Left L1) (03/11/15)    Pain Score: Pre-procedure: 4 /10 Post-procedure: 0-No pain/10   Pre-op Assessment:  Kelly Rollins is a 48 y.o. (year old), female patient, seen today for interventional treatment. She  has a past surgical history that includes Cesarean section; Vaginal hysterectomy; Cholecystectomy; Breast reduction surgery; Breast excisional biopsy (Left, 1997); and Reduction mammaplasty (Bilateral, 1997). Kelly Rollins has a current medication list which includes the following prescription(s): insulin  detemir, metoprolol succinate, ondansetron, venlafaxine xr, gabapentin, omeprazole, and senna-docusate, and the following Facility-Administered Medications: fentanyl and midazolam. Her primarily concern today is the Back Pain (lumbar bilateral )  Initial Vital Signs:  Pulse/HCG Rate: 92ECG Heart Rate: 80 Temp: 98.2 F (36.8 C) Resp: 16 BP: (!) 113/96 SpO2: 100 %  BMI: Estimated body mass index is 32.24 kg/m as calculated from the following:   Height as of this encounter: 5\' 3"  (1.6 m).   Weight as of this encounter: 182 lb (82.6 kg).  Risk Assessment: Allergies: Reviewed. She is allergic to atorvastatin; levofloxacin; morphine; penicillins; and tramadol hcl.  Allergy Precautions: None required Coagulopathies: Reviewed. None identified.  Blood-thinner therapy: None at this time Active Infection(s): Reviewed. None identified. Kelly Rollins is afebrile  Site Confirmation: Kelly Rollins was asked to confirm the procedure and laterality before marking the site Procedure checklist: Completed Consent: Before the procedure and under the influence of no sedative(s), amnesic(s), or anxiolytics, the patient was informed of the treatment options, risks and possible complications. To fulfill our ethical and legal obligations, as recommended by the American Medical Association's Code of Ethics, I have informed the patient of my clinical impression; the nature and purpose of the treatment or procedure; the risks, benefits, and possible complications of the intervention; the alternatives, including doing nothing; the risk(s) and benefit(s) of the alternative treatment(s) or procedure(s); and the risk(s) and benefit(s) of doing nothing. The patient was provided information about the general risks and possible complications associated with the procedure. These may include, but are not limited to: failure to achieve desired goals, infection, bleeding, organ or nerve damage, allergic reactions, paralysis, and  death. In addition, the patient was informed of those risks and complications associated to Spine-related procedures, such as failure to decrease pain; infection (i.e.: Meningitis, epidural  or intraspinal abscess); bleeding (i.e.: epidural hematoma, subarachnoid hemorrhage, or any other type of intraspinal or peri-dural bleeding); organ or nerve damage (i.e.: Any type of peripheral nerve, nerve root, or spinal cord injury) with subsequent damage to sensory, motor, and/or autonomic systems, resulting in permanent pain, numbness, and/or weakness of one or several areas of the body; allergic reactions; (i.e.: anaphylactic reaction); and/or death. Furthermore, the patient was informed of those risks and complications associated with the medications. These include, but are not limited to: allergic reactions (i.e.: anaphylactic or anaphylactoid reaction(s)); adrenal axis suppression; blood sugar elevation that in diabetics may result in ketoacidosis or comma; water retention that in patients with history of congestive heart failure may result in shortness of breath, pulmonary edema, and decompensation with resultant heart failure; weight gain; swelling or edema; medication-induced neural toxicity; particulate matter embolism and blood vessel occlusion with resultant organ, and/or nervous system infarction; and/or aseptic necrosis of one or more joints. Finally, the patient was informed that Medicine is not an exact science; therefore, there is also the possibility of unforeseen or unpredictable risks and/or possible complications that may result in a catastrophic outcome. The patient indicated having understood very clearly. We have given the patient no guarantees and we have made no promises. Enough time was given to the patient to ask questions, all of which were answered to the patient's satisfaction. Kelly Rollins has indicated that she wanted to continue with the procedure. Attestation: I, the ordering provider,  attest that I have discussed with the patient the benefits, risks, side-effects, alternatives, likelihood of achieving goals, and potential problems during recovery for the procedure that I have provided informed consent. Date   Time: 11/17/2018  9:43 AM  Pre-Procedure Preparation:  Monitoring: As per clinic protocol. Respiration, ETCO2, SpO2, BP, heart rate and rhythm monitor placed and checked for adequate function Safety Precautions: Patient was assessed for positional comfort and pressure points before starting the procedure. Time-out: I initiated and conducted the "Time-out" before starting the procedure, as per protocol. The patient was asked to participate by confirming the accuracy of the "Time Out" information. Verification of the correct person, site, and procedure were performed and confirmed by me, the nursing staff, and the patient. "Time-out" conducted as per Joint Commission's Universal Protocol (UP.01.01.01). Time: 1044  Description of Procedure:          Laterality: Bilateral. The procedure was performed in identical fashion on both sides. Levels:  L2, L3, L4, L5, & S1 Medial Branch Level(s) Area Prepped: Posterior Lumbosacral Region Prepping solution: DuraPrep (Iodine Povacrylex [0.7% available iodine] and Isopropyl Alcohol, 74% w/w) Safety Precautions: Aspiration looking for blood return was conducted prior to all injections. At no point did we inject any substances, as a needle was being advanced. Before injecting, the patient was told to immediately notify me if she was experiencing any new onset of "ringing in the ears, or metallic taste in the mouth". No attempts were made at seeking any paresthesias. Safe injection practices and needle disposal techniques used. Medications properly checked for expiration dates. SDV (single dose vial) medications used. After the completion of the procedure, all disposable equipment used was discarded in the proper designated medical waste  containers. Local Anesthesia: Protocol guidelines were followed. The patient was positioned over the fluoroscopy table. The area was prepped in the usual manner. The time-out was completed. The target area was identified using fluoroscopy. A 12-in long, straight, sterile hemostat was used with fluoroscopic guidance to locate the targets for each level blocked.  Once located, the skin was marked with an approved surgical skin marker. Once all sites were marked, the skin (epidermis, dermis, and hypodermis), as well as deeper tissues (fat, connective tissue and muscle) were infiltrated with a small amount of a short-acting local anesthetic, loaded on a 10cc syringe with a 25G, 1.5-in  Needle. An appropriate amount of time was allowed for local anesthetics to take effect before proceeding to the next step. Local Anesthetic: Lidocaine 2.0% The unused portion of the local anesthetic was discarded in the proper designated containers. Technical explanation of process:  L2 Medial Branch Nerve Block (MBB): The target area for the L2 medial branch is at the junction of the postero-lateral aspect of the superior articular process and the superior, posterior, and medial edge of the transverse process of L3. Under fluoroscopic guidance, a Quincke needle was inserted until contact was made with os over the superior postero-lateral aspect of the pedicular shadow (target area). After negative aspiration for blood, 0.5 mL of the nerve block solution was injected without difficulty or complication. The needle was removed intact. L3 Medial Branch Nerve Block (MBB): The target area for the L3 medial branch is at the junction of the postero-lateral aspect of the superior articular process and the superior, posterior, and medial edge of the transverse process of L4. Under fluoroscopic guidance, a Quincke needle was inserted until contact was made with os over the superior postero-lateral aspect of the pedicular shadow (target area).  After negative aspiration for blood, 0.5 mL of the nerve block solution was injected without difficulty or complication. The needle was removed intact. L4 Medial Branch Nerve Block (MBB): The target area for the L4 medial branch is at the junction of the postero-lateral aspect of the superior articular process and the superior, posterior, and medial edge of the transverse process of L5. Under fluoroscopic guidance, a Quincke needle was inserted until contact was made with os over the superior postero-lateral aspect of the pedicular shadow (target area). After negative aspiration for blood, 0.5 mL of the nerve block solution was injected without difficulty or complication. The needle was removed intact. L5 Medial Branch Nerve Block (MBB): The target area for the L5 medial branch is at the junction of the postero-lateral aspect of the superior articular process and the superior, posterior, and medial edge of the sacral ala. Under fluoroscopic guidance, a Quincke needle was inserted until contact was made with os over the superior postero-lateral aspect of the pedicular shadow (target area). After negative aspiration for blood, 0.5 mL of the nerve block solution was injected without difficulty or complication. The needle was removed intact. S1 Medial Branch Nerve Block (MBB): The target area for the S1 medial branch is at the posterior and inferior 6 o'clock position of the L5-S1 facet joint. Under fluoroscopic guidance, the Quincke needle inserted for the L5 MBB was redirected until contact was made with os over the inferior and postero aspect of the sacrum, at the 6 o' clock position under the L5-S1 facet joint (Target area). After negative aspiration for blood, 0.5 mL of the nerve block solution was injected without difficulty or complication. The needle was removed intact.  Nerve block solution: 0.2% PF-Ropivacaine + Triamcinolone (40 mg/mL) diluted to a final concentration of 4 mg of Triamcinolone/mL of  Ropivacaine The unused portion of the solution was discarded in the proper designated containers. Procedural Needles: 22-gauge, 3.5-inch, Quincke needles used for all levels.  Once the entire procedure was completed, the treated area was cleaned, making sure  to leave some of the prepping solution back to take advantage of its long term bactericidal properties.   Illustration of the posterior view of the lumbar spine and the posterior neural structures. Laminae of L2 through S1 are labeled. DPRL5, dorsal primary ramus of L5; DPRS1, dorsal primary ramus of S1; DPR3, dorsal primary ramus of L3; FJ, facet (zygapophyseal) joint L3-L4; I, inferior articular process of L4; LB1, lateral branch of dorsal primary ramus of L1; IAB, inferior articular branches from L3 medial branch (supplies L4-L5 facet joint); IBP, intermediate branch plexus; MB3, medial branch of dorsal primary ramus of L3; NR3, third lumbar nerve root; S, superior articular process of L5; SAB, superior articular branches from L4 (supplies L4-5 facet joint also); TP3, transverse process of L3.  Vitals:   11/17/18 1053 11/17/18 1103 11/17/18 1113 11/17/18 1124  BP: 121/71 134/72 137/80 135/82  Pulse:      Resp: 16 17 17 18   Temp:  98 F (36.7 C)  98.1 F (36.7 C)  TempSrc:      SpO2: 96% 98% 99% 99%  Weight:      Height:         Start Time: 1044 hrs. End Time: 1053 hrs.  Imaging Guidance (Spinal):          Type of Imaging Technique: Fluoroscopy Guidance (Spinal) Indication(s): Assistance in needle guidance and placement for procedures requiring needle placement in or near specific anatomical locations not easily accessible without such assistance. Exposure Time: Please see nurses notes. Contrast: None used. Fluoroscopic Guidance: I was personally present during the use of fluoroscopy. "Tunnel Vision Technique" used to obtain the best possible view of the target area. Parallax error corrected before commencing the procedure.  "Direction-depth-direction" technique used to introduce the needle under continuous pulsed fluoroscopy. Once target was reached, antero-posterior, oblique, and lateral fluoroscopic projection used confirm needle placement in all planes. Images permanently stored in EMR. Interpretation: No contrast injected. I personally interpreted the imaging intraoperatively. Adequate needle placement confirmed in multiple planes. Permanent images saved into the patient's record.  Antibiotic Prophylaxis:   Anti-infectives (From admission, onward)   None     Indication(s): None identified  Post-operative Assessment:  Post-procedure Vital Signs:  Pulse/HCG Rate: 9277 Temp: 98.1 F (36.7 C) Resp: 18 BP: 135/82 SpO2: 99 %  EBL: None  Complications: No immediate post-treatment complications observed by team, or reported by patient.  Note: The patient tolerated the entire procedure well. A repeat set of vitals were taken after the procedure and the patient was kept under observation following institutional policy, for this type of procedure. Post-procedural neurological assessment was performed, showing return to baseline, prior to discharge. The patient was provided with post-procedure discharge instructions, including a section on how to identify potential problems. Should any problems arise concerning this procedure, the patient was given instructions to immediately contact us, at any time, without hesitation. In any case, we plan to contact the patient by telephone for a follow-up status report regarding this interventional procedure.  Comments:  No additional relevant information.  Plan of Care  Orders:  Orders Placed This Encounter  Procedures   LUMBAR FACET(MEDIAL BRANCH NERVE BLOCK) MBNB    Scheduling Instructions:     Side: Bilateral     Level: L3-4, L4-5, & L5-S1 Facets (L2, L3, L4, L5, & S1 Medial Branch Nerves)     Sedation: With Sedation.     Timeframe: Today    Order Specific  Question:   Where will this procedure be performed?  Answer:   [facility]   DG [facility] C-ARM 1-60 MIN NO REPORT    Intraoperative interpretation by procedural physician at [facility].    Standing Status:   Standing    Number of Occurrences:   1    Order Specific Question:   Reason for exam:    Answer:   Assistance in needle guidance and placement for procedures requiring needle placement in or near specific anatomical locations not easily accessible without such assistance.   Informed Consent Details: Physician/Practitioner Attestation; Transcribe to consent form and obtain patient signature    Provider Attestation: I, [place] Dossie Arbour, MD, (Pain Management Specialist), the physician/practitioner, attest that I have discussed with the patient the benefits, risks, side effects, alternatives, likelihood of achieving goals and potential problems during recovery for the procedure that I have provided informed consent.    Scheduling Instructions:     Procedure: Lumbar Facet Block  under fluoroscopic guidance     Indication(s): Low Back Pain, with our without leg pain, due to Facet Joint Arthralgia (Joint Pain) known as Lumbar Facet Syndrome, secondary to Lumbar, and/or Lumbosacral Spondylosis (Arthritis of the Spine), without myelopathy or radiculopathy (Nerve Damage).     Note: Always confirm laterality of pain with Ms. Green, before procedure.     Transcribe to consent form and obtain patient signature.   Provide equipment / supplies at bedside    Equipment required: Single use, disposable, "Block Tray"    Standing Status:   Standing    Number of Occurrences:   1    Order Specific Question:   Specify    Answer:   Block Tray   Chronic Opioid Analgesic:  No opioid analgesic prescriptions from our practice.  MME/day:15mg /day.   Medications ordered for procedure: Meds ordered this encounter  Medications   lidocaine (XYLOCAINE) 2 % (with pres) injection 400  mg   lactated ringers infusion 1,000 mL   midazolam (VERSED) 5 MG/5ML injection 1-2 mg    Make sure Flumazenil is available in the pyxis when using this medication. If oversedation occurs, administer 0.2 mg IV over 15 sec. If after 45 sec no response, administer 0.2 mg again over 1 min; may repeat at 1 min intervals; not to exceed 4 doses (1 mg)   fentaNYL (SUBLIMAZE) injection 25-50 mcg    Make sure Narcan is available in the pyxis when using this medication. In the event of respiratory depression (RR< 8/min): Titrate NARCAN (naloxone) in increments of 0.1 to 0.2 mg IV at 2-3 minute intervals, until desired degree of reversal.   ropivacaine (PF) 2 mg/mL (0.2%) (NAROPIN) injection 18 mL   triamcinolone acetonide (KENALOG-40) injection 80 mg   Medications administered: We administered lidocaine, lactated ringers, midazolam, fentaNYL, ropivacaine (PF) 2 mg/mL (0.2%), and triamcinolone acetonide.  See the medical record for exact dosing, route, and time of administration.  Follow-up plan:   Return in about 2 weeks (around 12/01/2018) for (VV), Procedure.       Interventional management options: Planned, scheduled, and/or pending:   Diagnostic bilateral lumbar facet block #3   This will be followed at a later time by a diagnostic left IA hip joint injection #1    Considering:   Diagnostic bilateral IA hip joint injection  Diagnostic left IA hip joint injection #1    Palliative PRN treatment(s):   Diagnostic bilateral hip joint injection  Palliative bilateral lumbar facet block #3  Palliative left L1 transverse process injection #2  Palliative/Therapeutic righ lumbar facet RFA #  2 (last done 06/23/2016) Palliative/Therapeutic left lumbar facet RFA #2 (last done 06/08/2016)     Recent Visits Date Type Provider Dept  11/16/18 Office Visit Milinda Pointer, MD [facility]  Showing recent visits within past 90 days and meeting all other requirements   Today's Visits Date  Type Provider Dept  11/17/18 Procedure visit Milinda Pointer, MD [facility]  Showing today's visits and meeting all other requirements   Future Appointments Date Type Provider Dept  12/12/18 Appointment Milinda Pointer, MD [facility]  Showing future appointments within next 90 days and meeting all other requirements   Disposition: Discharge home  Discharge Date & Time: 11/17/2018; 1125 hrs.   Primary Care Physician: [name], PA-C Location: [facility] Note by: Gaspar Cola, MD Date: 11/17/2018; Time: 11:35 AM  Disclaimer:  Medicine is not an exact science. The only guarantee in medicine is that nothing is guaranteed. It is important to note that the decision to proceed with this intervention was based on the information collected from the patient. The Data and conclusions were drawn from the patient's questionnaire, the interview, and the physical examination. Because the information was provided in large part by the patient, it cannot be guaranteed that it has not been purposely or unconsciously manipulated. Every effort has been made to obtain as much relevant data as possible for this evaluation. It is important to note that the conclusions that lead to this procedure are derived in large part from the available data. Always take into account that the treatment will also be dependent on availability of resources and existing treatment guidelines, considered by other Pain Management Practitioners as being common knowledge and practice, at the time of the intervention. For Medico-Legal purposes, it is also important to point out that variation in procedural techniques and pharmacological choices are the acceptable norm. The indications, contraindications, technique, and results of the above procedure should only be interpreted and judged by a Board-Certified Interventional Pain Specialist with extensive familiarity and  expertise in the same exact procedure and technique.

## 2018-11-16 NOTE — Patient Instructions (Addendum)
____________________________________________________________________________________________  Preparing for Procedure with Sedation  Procedure appointments are limited to planned procedures: . No Prescription Refills. . No disability issues will be discussed. . No medication changes will be discussed.  Instructions: . Oral Intake: Do not eat or drink anything for at least 8 hours prior to your procedure. . Transportation: Public transportation is not allowed. Bring an adult driver. The driver must be physically present in our waiting room before any procedure can be started. . Physical Assistance: Bring an adult physically capable of assisting you, in the event you need help. This adult should keep you company at home for at least 6 hours after the procedure. . Blood Pressure Medicine: Take your blood pressure medicine with a sip of water the morning of the procedure. . Blood thinners: Notify our staff if you are taking any blood thinners. Depending on which one you take, there will be specific instructions on how and when to stop it. . Diabetics on insulin: Notify the staff so that you can be scheduled 1st case in the morning. If your diabetes requires high dose insulin, take only  of your normal insulin dose the morning of the procedure and notify the staff that you have done so. . Preventing infections: Shower with an antibacterial soap the morning of your procedure. . Build-up your immune system: Take 1000 mg of Vitamin C with every meal (3 times a day) the day prior to your procedure. . Antibiotics: Inform the staff if you have a condition or reason that requires you to take antibiotics before dental procedures. . Pregnancy: If you are pregnant, call and cancel the procedure. . Sickness: If you have a cold, fever, or any active infections, call and cancel the procedure. . Arrival: You must be in the facility at least 30 minutes prior to your scheduled procedure. . Children: Do not bring  children with you. . Dress appropriately: Bring dark clothing that you would not mind if they get stained. . Valuables: Do not bring any jewelry or valuables.  Reasons to call and reschedule or cancel your procedure: (Following these recommendations will minimize the risk of a serious complication.) . Surgeries: Avoid having procedures within 2 weeks of any surgery. (Avoid for 2 weeks before or after any surgery). . Flu Shots: Avoid having procedures within 2 weeks of a flu shots or . (Avoid for 2 weeks before or after immunizations). . Barium: Avoid having a procedure within 7-10 days after having had a radiological study involving the use of radiological contrast. (Myelograms, Barium swallow or enema study). . Heart attacks: Avoid any elective procedures or surgeries for the initial 6 months after a "Myocardial Infarction" (Heart Attack). . Blood thinners: It is imperative that you stop these medications before procedures. Let us know if you if you take any blood thinner.  . Infection: Avoid procedures during or within two weeks of an infection (including chest colds or gastrointestinal problems). Symptoms associated with infections include: Localized redness, fever, chills, night sweats or profuse sweating, burning sensation when voiding, cough, congestion, stuffiness, runny nose, sore throat, diarrhea, nausea, vomiting, cold or Flu symptoms, recent or current infections. It is specially important if the infection is over the area that we intend to treat. . Heart and lung problems: Symptoms that may suggest an active cardiopulmonary problem include: cough, chest pain, breathing difficulties or shortness of breath, dizziness, ankle swelling, uncontrolled high or unusually low blood pressure, and/or palpitations. If you are experiencing any of these symptoms, cancel your procedure and contact   your primary care physician for an evaluation.  Remember:  Regular Business hours are:  Monday to Thursday  8:00 AM to 4:00 PM  Provider's Schedule: Milinda Pointer, MD:  Procedure days: Tuesday and Thursday 7:30 AM to 4:00 PM  Gillis Santa, MD:  Procedure days: Monday and Wednesday 7:30 AM to 4:00 PM ____________________________________________________________________________________________   GENERAL RISKS AND COMPLICATIONS  What are the risk, side effects and possible complications? Generally speaking, most procedures are safe.  However, with any procedure there are risks, side effects, and the possibility of complications.  The risks and complications are dependent upon the sites that are lesioned, or the type of nerve block to be performed.  The closer the procedure is to the spine, the more serious the risks are.  Great care is taken when placing the radio frequency needles, block needles or lesioning probes, but sometimes complications can occur. 1. Infection: Any time there is an injection through the skin, there is a risk of infection.  This is why sterile conditions are used for these blocks.  There are four possible types of infection. 1. Localized skin infection. 2. Central Nervous System Infection-This can be in the form of Meningitis, which can be deadly. 3. Epidural Infections-This can be in the form of an epidural abscess, which can cause pressure inside of the spine, causing compression of the spinal cord with subsequent paralysis. This would require an emergency surgery to decompress, and there are no guarantees that the patient would recover from the paralysis. 4. Discitis-This is an infection of the intervertebral discs.  It occurs in about 1% of discography procedures.  It is difficult to treat and it may lead to surgery.        2. Pain: the needles have to go through skin and soft tissues, will cause soreness.       3. Damage to internal structures:  The nerves to be lesioned may be near blood vessels or    other nerves which can be potentially damaged.       4. Bleeding:  Bleeding is more common if the patient is taking blood thinners such as  aspirin, Coumadin, Ticiid, Plavix, etc., or if he/she have some genetic predisposition  such as hemophilia. Bleeding into the spinal canal can cause compression of the spinal  cord with subsequent paralysis.  This would require an emergency surgery to  decompress and there are no guarantees that the patient would recover from the  paralysis.       5. Pneumothorax:  Puncturing of a lung is a possibility, every time a needle is introduced in  the area of the chest or upper back.  Pneumothorax refers to free air around the  collapsed lung(s), inside of the thoracic cavity (chest cavity).  Another two possible  complications related to a similar event would include: Hemothorax and Chylothorax.   These are variations of the Pneumothorax, where instead of air around the collapsed  lung(s), you may have blood or chyle, respectively.       6. Spinal headaches: They may occur with any procedures in the area of the spine.       7. Persistent CSF (Cerebro-Spinal Fluid) leakage: This is a rare problem, but may occur  with prolonged intrathecal or epidural catheters either due to the formation of a fistulous  track or a dural tear.       8. Nerve damage: By working so close to the spinal cord, there is always a possibility of  nerve damage,  which could be as serious as a permanent spinal cord injury with  paralysis.       9. Death:  Although rare, severe deadly allergic reactions known as "Anaphylactic  reaction" can occur to any of the medications used.      10. Worsening of the symptoms:  We can always make thing worse.  What are the chances of something like this happening? Chances of any of this occuring are extremely low.  By statistics, you have more of a chance of getting killed in a motor vehicle accident: while driving to the hospital than any of the above occurring .  Nevertheless, you should be aware that they are possibilities.  In  general, it is similar to taking a shower.  Everybody knows that you can slip, hit your head and get killed.  Does that mean that you should not shower again?  Nevertheless always keep in mind that statistics do not mean anything if you happen to be on the wrong side of them.  Even if a procedure has a 1 (one) in a 1,000,000 (million) chance of going wrong, it you happen to be that one..Also, keep in mind that by statistics, you have more of a chance of having something go wrong when taking medications.  Who should not have this procedure? If you are on a blood thinning medication (e.g. Coumadin, Plavix, see list of "Blood Thinners"), or if you have an active infection going on, you should not have the procedure.  If you are taking any blood thinners, please inform your physician.  How should I prepare for this procedure?  Do not eat or drink anything at least six hours prior to the procedure.  Bring a driver with you .  It cannot be a taxi.  Come accompanied by an adult that can drive you back, and that is strong enough to help you if your legs get weak or numb from the local anesthetic.  Take all of your medicines the morning of the procedure with just enough water to swallow them.  If you have diabetes, make sure that you are scheduled to have your procedure done first thing in the morning, whenever possible.  If you have diabetes, take only half of your insulin dose and notify our nurse that you have done so as soon as you arrive at the clinic.  If you are diabetic, but only take blood sugar pills (oral hypoglycemic), then do not take them on the morning of your procedure.  You may take them after you have had the procedure.  Do not take aspirin or any aspirin-containing medications, at least eleven (11) days prior to the procedure.  They may prolong bleeding.  Wear loose fitting clothing that may be easy to take off and that you would not mind if it got stained with Betadine or  blood.  Do not wear any jewelry or perfume  Remove any nail coloring.  It will interfere with some of our monitoring equipment.  NOTE: Remember that this is not meant to be interpreted as a complete list of all possible complications.  Unforeseen problems may occur.  BLOOD THINNERS The following drugs contain aspirin or other products, which can cause increased bleeding during surgery and should not be taken for 2 weeks prior to and 1 week after surgery.  If you should need take something for relief of minor pain, you may take acetaminophen which is found in Tylenol,m Datril, Anacin-3 and Panadol. It is not blood thinner. The  products listed below are.  Do not take any of the products listed below in addition to any listed on your instruction sheet.  A.P.C or A.P.C with Codeine Codeine Phosphate Capsules #3 Ibuprofen Ridaura  ABC compound Congesprin Imuran rimadil  Advil Cope Indocin Robaxisal  Alka-Seltzer Effervescent Pain Reliever and Antacid Coricidin or Coricidin-D  Indomethacin Rufen  Alka-Seltzer plus Cold Medicine Cosprin Ketoprofen S-A-C Tablets  Anacin Analgesic Tablets or Capsules Coumadin Korlgesic Salflex  Anacin Extra Strength Analgesic tablets or capsules CP-2 Tablets Lanoril Salicylate  Anaprox Cuprimine Capsules Levenox Salocol  Anexsia-D Dalteparin Magan Salsalate  Anodynos Darvon compound Magnesium Salicylate Sine-off  Ansaid Dasin Capsules Magsal Sodium Salicylate  Anturane Depen Capsules Marnal Soma  APF Arthritis pain formula Dewitt's Pills Measurin Stanback  Argesic Dia-Gesic Meclofenamic Sulfinpyrazone  Arthritis Bayer Timed Release Aspirin Diclofenac Meclomen Sulindac  Arthritis pain formula Anacin Dicumarol Medipren Supac  Analgesic (Safety coated) Arthralgen Diffunasal Mefanamic Suprofen  Arthritis Strength Bufferin Dihydrocodeine Mepro Compound Suprol  Arthropan liquid Dopirydamole Methcarbomol with Aspirin Synalgos  ASA tablets/Enseals Disalcid Micrainin  Tagament  Ascriptin Doan's Midol Talwin  Ascriptin A/D Dolene Mobidin Tanderil  Ascriptin Extra Strength Dolobid Moblgesic Ticlid  Ascriptin with Codeine Doloprin or Doloprin with Codeine Momentum Tolectin  Asperbuf Duoprin Mono-gesic Trendar  Aspergum Duradyne Motrin or Motrin IB Triminicin  Aspirin plain, buffered or enteric coated Durasal Myochrisine Trigesic  Aspirin Suppositories Easprin Nalfon Trillsate  Aspirin with Codeine Ecotrin Regular or Extra Strength Naprosyn Uracel  Atromid-S Efficin Naproxen Ursinus  Auranofin Capsules Elmiron Neocylate Vanquish  Axotal Emagrin Norgesic Verin  Azathioprine Empirin or Empirin with Codeine Normiflo Vitamin E  Azolid Emprazil Nuprin Voltaren  Bayer Aspirin plain, buffered or children's or timed BC Tablets or powders Encaprin Orgaran Warfarin Sodium  Buff-a-Comp Enoxaparin Orudis Zorpin  Buff-a-Comp with Codeine Equegesic Os-Cal-Gesic   Buffaprin Excedrin plain, buffered or Extra Strength Oxalid   Bufferin Arthritis Strength Feldene Oxphenbutazone   Bufferin plain or Extra Strength Feldene Capsules Oxycodone with Aspirin   Bufferin with Codeine Fenoprofen Fenoprofen Pabalate or Pabalate-SF   Buffets II Flogesic Panagesic   Buffinol plain or Extra Strength Florinal or Florinal with Codeine Panwarfarin   Buf-Tabs Flurbiprofen Penicillamine   Butalbital Compound Four-way cold tablets Penicillin   Butazolidin Fragmin Pepto-Bismol   Carbenicillin Geminisyn Percodan   Carna Arthritis Reliever Geopen Persantine   Carprofen Gold's salt Persistin   Chloramphenicol Goody's Phenylbutazone   Chloromycetin Haltrain Piroxlcam   Clmetidine heparin Plaquenil   Cllnoril Hyco-pap Ponstel   Clofibrate Hydroxy chloroquine Propoxyphen         Before stopping any of these medications, be sure to consult the physician who ordered them.  Some, such as Coumadin (Warfarin) are ordered to prevent or treat serious conditions such as "deep thrombosis", "pumonary  embolisms", and other heart problems.  The amount of time that you may need off of the medication may also vary with the medication and the reason for which you were taking it.  If you are taking any of these medications, please make sure you notify your pain physician before you undergo any procedures.         Pain Management Discharge Instructions  General Discharge Instructions :  If you need to reach your doctor call: Monday-Friday 8:00 am - 4:00 pm at [phone] or toll free [phone].  After clinic hours [phone] to have operator reach doctor.  Bring all of your medication bottles to all your appointments in the pain clinic.  To cancel or reschedule your appointment with Pain  Management please remember to call 24 hours in advance to avoid a fee.  Refer to the educational materials which you have been given on: General Risks, I had my Procedure. Discharge Instructions, Post Sedation.  Post Procedure Instructions:  The drugs you were given will stay in your system until tomorrow, so for the next 24 hours you should not drive, make any legal decisions or drink any alcoholic beverages.  You may eat anything you prefer, but it is better to start with liquids then soups and crackers, and gradually work up to solid foods.  Please notify your doctor immediately if you have any unusual bleeding, trouble breathing or pain that is not related to your normal pain.  Depending on the type of procedure that was done, some parts of your body may feel week and/or numb.  This usually clears up by tonight or the next day.  Walk with the use of an assistive device or accompanied by an adult for the 24 hours.  You may use ice on the affected area for the first 24 hours.  Put ice in a Ziploc bag and cover with a towel and place against area 15 minutes on 15 minutes off.  You may switch to heat after 24 hours.Facet Blocks Patient Information  Description: The facets are joints in the  spine between the vertebrae.  Like any joints in the body, facets can become irritated and painful.  Arthritis can also effect the facets.  By injecting steroids and local anesthetic in and around these joints, we can temporarily block the nerve supply to them.  Steroids act directly on irritated nerves and tissues to reduce selling and inflammation which often leads to decreased pain.  Facet blocks may be done anywhere along the spine from the neck to the low back depending upon the location of your pain.   After numbing the skin with local anesthetic (like Novocaine), a small needle is passed onto the facet joints under x-ray guidance.  You may experience a sensation of pressure while this is being done.  The entire block usually lasts about 15-25 minutes.   Conditions which may be treated by facet blocks:   Low back/buttock pain  Neck/shoulder pain  Certain types of headaches  Preparation for the injection:  1. Do not eat any solid food or dairy products within 8 hours of your appointment. 2. You may drink clear liquid up to 3 hours before appointment.  Clear liquids include water, black coffee, juice or soda.  No milk or cream please. 3. You may take your regular medication, including pain medications, with a sip of water before your appointment.  Diabetics should hold regular insulin (if taken separately) and take 1/2 normal NPH dose the morning of the procedure.  Carry some sugar containing items with you to your appointment. 4. A driver must accompany you and be prepared to drive you home after your procedure. 5. Bring all your current medications with you. 6. An IV may be inserted and sedation may be given at the discretion of the physician. 7. A blood pressure cuff, EKG and other monitors will often be applied during the procedure.  Some patients may need to have extra oxygen administered for a short period. 8. You will be asked to provide medical information, including your allergies  and medications, prior to the procedure.  We must know immediately if you are taking blood thinners (like Coumadin/Warfarin) or if you are allergic to IV iodine contrast (dye).  We must know  if you could possible be pregnant.  Possible side-effects:   Bleeding from needle site  Infection (rare, may require surgery)  Nerve injury (rare)  Numbness & tingling (temporary)  Difficulty urinating (rare, temporary)  Spinal headache (a headache worse with upright posture)  Light-headedness (temporary)  Pain at injection site (serveral days)  Decreased blood pressure (rare, temporary)  Weakness in arm/leg (temporary)  Pressure sensation in back/neck (temporary)   Call if you experience:   Fever/chills associated with headache or increased back/neck pain  Headache worsened by an upright position  New onset, weakness or numbness of an extremity below the injection site  Hives or difficulty breathing (go to the emergency room)  Inflammation or drainage at the injection site(s)  Severe back/neck pain greater than usual  New symptoms which are concerning to you  Please note:  Although the local anesthetic injected can often make your back or neck feel good for several hours after the injection, the pain will likely return. It takes 3-7 days for steroids to work.  You may not notice any pain relief for at least one week.  If effective, we will often do a series of 2-3 injections spaced 3-6 weeks apart to maximally decrease your pain.  After the initial series, you may be a candidate for a more permanent nerve block of the facets.  If you have any questions, please call #[phone]) [facility]

## 2018-11-16 NOTE — Patient Instructions (Signed)

## 2018-11-16 NOTE — Progress Notes (Signed)
Safety precautions to be maintained throughout the outpatient stay will include: orient to surroundings, keep bed in low position, maintain call bell within reach at all times, provide assistance with transfer out of bed and ambulation.  

## 2018-11-17 ENCOUNTER — Encounter: Payer: Self-pay | Admitting: Pain Medicine

## 2018-11-17 ENCOUNTER — Ambulatory Visit
Admission: RE | Admit: 2018-11-17 | Discharge: 2018-11-17 | Disposition: A | Discharge status: Home / Self Care | Payer: Medicare Other | Source: Ambulatory Visit | Attending: Pain Medicine | Admitting: Pain Medicine

## 2018-11-17 ENCOUNTER — Ambulatory Visit (HOSPITAL_BASED_OUTPATIENT_CLINIC_OR_DEPARTMENT_OTHER): Payer: Medicare Other | Admitting: Pain Medicine

## 2018-11-17 VITALS — BP 135/82 | HR 92 | Temp 98.1°F | Resp 18 | Ht 63.0 in | Wt 182.0 lb

## 2018-11-17 DIAGNOSIS — M5137 Other intervertebral disc degeneration, lumbosacral region: Secondary | ICD-10-CM | POA: Insufficient documentation

## 2018-11-17 DIAGNOSIS — M47816 Spondylosis without myelopathy or radiculopathy, lumbar region: Secondary | ICD-10-CM

## 2018-11-17 DIAGNOSIS — S32009S Unspecified fracture of unspecified lumbar vertebra, sequela: Secondary | ICD-10-CM | POA: Diagnosis present

## 2018-11-17 DIAGNOSIS — G8929 Other chronic pain: Secondary | ICD-10-CM | POA: Insufficient documentation

## 2018-11-17 DIAGNOSIS — M47817 Spondylosis without myelopathy or radiculopathy, lumbosacral region: Secondary | ICD-10-CM | POA: Diagnosis present

## 2018-11-17 DIAGNOSIS — M545 Low back pain: Secondary | ICD-10-CM | POA: Diagnosis present

## 2018-11-17 MED ORDER — ROPIVACAINE HCL 2 MG/ML IJ SOLN
18.0000 mL | Freq: Once | INTRAMUSCULAR | Status: AC
  Administered 2018-11-17: 18 mL via PERINEURAL
  Filled 2018-11-17: qty 20

## 2018-11-17 MED ORDER — FENTANYL CITRATE (PF) 100 MCG/2ML IJ SOLN
25.0000 ug | INTRAMUSCULAR | Status: DC | PRN
  Administered 2018-11-17: 100 ug via INTRAVENOUS
  Filled 2018-11-17: qty 2

## 2018-11-17 MED ORDER — TRIAMCINOLONE ACETONIDE 40 MG/ML IJ SUSP
80.0000 mg | Freq: Once | INTRAMUSCULAR | Status: AC
  Administered 2018-11-17: 80 mg

## 2018-11-17 MED ORDER — LACTATED RINGERS IV SOLN
1000.0000 mL | Freq: Once | INTRAVENOUS | Status: AC
  Administered 2018-11-17: 1000 mL via INTRAVENOUS

## 2018-11-17 MED ORDER — MIDAZOLAM HCL 5 MG/5ML IJ SOLN
1.0000 mg | INTRAMUSCULAR | Status: DC | PRN
  Administered 2018-11-17: 11:00:00 3 mg via INTRAVENOUS
  Filled 2018-11-17: qty 5

## 2018-11-17 MED ORDER — TRIAMCINOLONE ACETONIDE 40 MG/ML IJ SUSP
INTRAMUSCULAR | Status: AC
  Filled 2018-11-17: qty 1

## 2018-11-17 MED ORDER — LIDOCAINE HCL 2 % IJ SOLN
20.0000 mL | Freq: Once | INTRAMUSCULAR | Status: AC
  Administered 2018-11-17: 400 mg
  Filled 2018-11-17: qty 20

## 2018-11-18 ENCOUNTER — Telehealth: Payer: Self-pay | Admitting: *Deleted

## 2018-11-18 NOTE — Telephone Encounter (Signed)
Attempted to reach patient this morning, mailbox is full and unable to leave message.  I did speak with husband on yesterday afternoon, he called stating that Massa was having a lot of pain and would like some pain medication called in, that she had never had this procedure without having additional pain medication called.  I explained to him that we do not typically prescribe pain medication following a lumbar facet block.  Suggested that she use ice and take whatever she would normally use for pain I.e. tylenol, ibuprofen etc.  Husband verbalized u/o information.

## 2018-11-21 LAB — MAGNESIUM: Magnesium: 2.1 mg/dL (ref 1.6–2.3)

## 2018-11-21 LAB — 25-HYDROXY VITAMIN D LCMS D2+D3
25-Hydroxy, Vitamin D-2: 4 ng/mL
25-Hydroxy, Vitamin D-3: 19 ng/mL
25-Hydroxy, Vitamin D: 23 ng/mL — ABNORMAL LOW

## 2018-11-21 LAB — C-REACTIVE PROTEIN: CRP: 2 mg/L (ref 0–10)

## 2018-11-21 LAB — SEDIMENTATION RATE: Sed Rate: 40 mm/hr — ABNORMAL HIGH (ref 0–32)

## 2018-11-21 LAB — VITAMIN B12: Vitamin B-12: 644 pg/mL (ref 232–1245)

## 2018-11-22 ENCOUNTER — Telehealth: Payer: Self-pay | Admitting: Pain Medicine

## 2018-11-22 NOTE — Telephone Encounter (Signed)
Moved patient appt to 11-28-18 at 8am Virtual Visit

## 2018-11-22 NOTE — Telephone Encounter (Signed)
Move PPE up please

## 2018-11-22 NOTE — Telephone Encounter (Signed)
Patient states she is miserable and doesn't know what to do. Should she move her PPE up or can he call some meds in to help thru this pain? Please call patient asap.

## 2018-11-24 ENCOUNTER — Telehealth: Payer: Self-pay | Admitting: *Deleted

## 2018-11-24 NOTE — Telephone Encounter (Signed)
Attempted to call for pre appointment review of allergies/meds/last procedure. Message left.

## 2018-11-25 ENCOUNTER — Telehealth: Payer: Self-pay

## 2018-11-25 NOTE — Telephone Encounter (Signed)
Called patient x2 no answer. Left message on answering machine taht we needed to get some information for mondays visit. Instructed to call us back

## 2018-11-27 NOTE — Progress Notes (Signed)
Pain Management Virtual Encounter Note - Virtual Visit via Telephone Telehealth (real-time audio visits between healthcare provider and patient).   Patient's Phone No. & Preferred Pharmacy:  [phone] (home); [phone] (mobile); (Preferred) [phone] [email or web address]  [place], [place] - [address] [place] [address] Phone: [phone] Fax: [phone]    Pre-screening note:  Our staff contacted Kelly Rollins and offered her an "in person", "face-to-face" appointment versus a telephone encounter. She indicated preferring the telephone encounter, at this time.   Reason for Virtual Visit: COVID-19*  Social distancing based on CDC and AMA recommendations.   I contacted Kelly Rollins on 11/28/2018 via telephone.      I clearly identified myself as Gaspar Cola, MD. I verified that I was speaking with the correct person using two identifiers (Name: Kelly Rollins, and date of birth: 11-05-1970).  Advanced Informed Consent I sought verbal advanced consent from Kelly Rollins for virtual visit interactions. I informed Kelly Rollins of possible security and privacy concerns, risks, and limitations associated with providing "not-in-person" medical evaluation and management services. I also informed Kelly Rollins of the availability of "in-person" appointments. Finally, I informed her that there would be a charge for the virtual visit and that she could be  personally, fully or partially, financially responsible for it. Kelly Rollins expressed understanding and agreed to proceed.   Historic Elements   Kelly Rollins is a 48 y.o. year old, female patient evaluated today after her last encounter by our practice on 11/25/2018. Kelly Rollins  has a past medical history of ABDOMINAL PAIN OTHER SPECIFIED SITE (02/21/2009), ABDOMINAL PAIN-LUQ (04/17/2009), Anal fissure, Asthma, Diabetes mellitus without complication ([place]),  [id number]), HEMATOCHEZIA (04/17/2009), Hypertension, IBS (irritable bowel syndrome), Migraine headache (11/06/2008), OVARIAN CYST, RIGHT (02/21/2009), PALPITATIONS, OCCASIONAL (04/17/2010), and Pancreatitis (10/14/2017). She also  has a past surgical history that includes Cesarean section; Vaginal hysterectomy; Cholecystectomy; Breast reduction surgery; Breast excisional biopsy (Left, 1997); and Reduction mammaplasty (Bilateral, 1997). Kelly Rollins has a current medication list which includes the following prescription(s): amlodipine, insulin detemir, metoprolol succinate, venlafaxine xr, esomeprazole, gabapentin, meloxicam, ondansetron, and senna-docusate. She  reports that she quit smoking about 4 years ago. Her smoking use included cigarettes. She has never used smokeless tobacco. She reports current alcohol use. She reports that she does not use drugs. Kelly Rollins is allergic to atorvastatin; levofloxacin; morphine; penicillins; and tramadol hcl.   HPI  Today, she is being contacted for both, medication management and a post-procedure assessment.  The patient indicates that the diagnostic bilateral lumbar facet block #3 did provide her with complete relief of the pain for the duration of the local anesthetic followed by a decrease to 50% benefit that lasted for approximately 2 weeks.  At this point, her benefit is less than 50%, but she still points out that she is doing better since she has noticed that the pain is not constant and she can do a lot more than before.  She indicates that the low back pain has improved, but the left hip pain did not.  Because of this, today I have asked the patient to perform a "figure 4 Rollins" Kelly Rollins).  She has indicated that when doing this with her left leg, this has triggered pain in the area of the lower back, buttocks, groin, and the side of the leg.  She also indicates when she tries to lay on her left side, she gets pain in that hip suggesting the  possibility  of a trochanteric bursa component to it.  In view of this, we have decided to go ahead and schedule her to come in for a diagnostic left-sided intra-articular hip joint injection under fluoroscopic guidance and IV sedation.  When she comes in, we will check to see if she has involvement of her trochanteric bursa and if so, will include that in the injection site.  In addition to this, today we went over the results of her lab work and I have pointed out that her vitamin D level came back low suggesting that she still has a vitamin D insufficiency.  I have recommended for her to take vitamin D3 (3000 international units daily in a.m.).  In addition, her sed rate came back elevated and we have added meloxicam to her regiment since she also indicates having most of her pain early in the morning.  Because she does have a history of gastroesophageal reflux disease, I will add Nexium to her regimen and we will put her back on the gabapentin.  She had indicated that 300 mg at bedtime would make her very sleepy and therefore we have decided to start with 100 mg at bedtime and increase it as tolerated every 2 weeks until we get back to the 300 mg at bedtime.  I have also recommend that she take the gabapentin early in the afternoon so that she does not have a "hangover" the next morning.  Post-Procedure Evaluation  Procedure: Diagnosticbilateral lumbar facet block#3under fluoroscopic guidance and IV sedation Pre-procedure pain level:  4/10 Post-procedure: 0/10 (100% relief)  Sedation: Sedation provided.  Effectiveness during initial hour after procedure(Ultra-Short Term Relief): 100 %   Local anesthetic used: Long-acting (4-6 hours) Effectiveness: Defined as any analgesic benefit obtained secondary to the administration of local anesthetics. This carries significant diagnostic value as to the etiological location, or anatomical origin, of the pain. Duration of benefit is expected to coincide with  the duration of the local anesthetic used.  Effectiveness during initial 4-6 hours after procedure(Short-Term Relief): 100 %   Long-term benefit: Defined as any relief past the pharmacologic duration of the local anesthetics.  Effectiveness past the initial 6 hours after procedure(Long-Term Relief): 50 % (2 weeks)   Current benefits: Defined as benefit that persist at this time.   Analgesia:  >50% relief Function: Kelly Rollins reports improvement in function ROM: Kelly Rollins reports improvement in ROM  Pharmacotherapy Assessment  Analgesic: No opioid analgesic prescriptions from our practice.  MME/day:15mg /day.   Monitoring: Pharmacotherapy: No side-effects or adverse reactions reported. [place] PMP: PDMP reviewed during this encounter.       Compliance: No problems identified. Effectiveness: Clinically acceptable. Plan: Refer to "POC".  UDS:  Summary  Date Value Ref Range Status  01/18/2017 FINAL  Final    Comment:    ==================================================================== TOXASSURE SELECT 13 (MW) ==================================================================== Test                             Result       Flag       Units Drug Absent but Declared for Prescription Verification   Oxycodone                      Not Detected UNEXPECTED ng/mg creat ==================================================================== Test                      Result    Flag   Units  Ref Range   Creatinine              209              mg/dL      >=20 ==================================================================== Declared Medications:  The flagging and interpretation on this report are based on the  following declared medications.  Unexpected results may arise from  inaccuracies in the declared medications.  **Note: The testing scope of this panel includes these medications:  Oxycodone  **Note: The testing scope of this panel does not include following  reported  medications:  Acetaminophen  Albuterol  Aspirin  Caffeine  Gabapentin  Ibuprofen  Magnesium  Metformin  Metoprolol (Toprol)  Multivitamin (MVI)  Ondansetron (Zofran)  Venlafaxine (Effexor)  Vitamin C  Vitamin D3 ==================================================================== For clinical consultation, please call [phone]. ====================================================================    Laboratory Chemistry Profile (12 mo)  Renal: No results found for requested labs within last 8760 hours.  Lab Results  Component Value Date   GFR 88.95 03/19/2010   GFRAA >60 11/05/2017   GFRNONAA >60 11/05/2017   Hepatic: No results found for requested labs within last 8760 hours. Lab Results  Component Value Date   AST 48 (H) 11/05/2017   ALT 41 11/05/2017   Other: 11/16/2018: 25-Hydroxy, Vitamin D 23; 25-Hydroxy, Vitamin D-2 4.0; 25-Hydroxy, Vitamin D-3 19; CRP 2; Sed Rate 40; Vitamin B-12 644 Note: Above Lab results reviewed.  Imaging  Last 90 days:  [facility] C-arm 1-60 Min No Report  Result Date: 11/17/2018 Fluoro was used, but no Radiologist interpretation will be provided. Please refer to "NOTES" tab for provider progress note.  US Breast Ltd Uni Right Inc Axilla  Result Date: 11/14/2018 CLINICAL DATA:  History of reduction mammoplasty in 1997. Patient complains of palpable abnormalities. The patient states that she thinks the palpable abnormalities are scar tissue and likely clinically stable. EXAM: DIGITAL DIAGNOSTIC BILATERAL MAMMOGRAM WITH CAD AND TOMO ULTRASOUND RIGHT BREAST COMPARISON:  None. ACR Breast Density Category b: There are scattered areas of fibroglandular density. FINDINGS: There are changes of reduction mammoplasty. No suspicious mass or malignant type microcalcifications detected in either breast. Spot tangential view of the area of clinical concern in the right breast shows normal fibroglandular tissue. Mammographic images were processed  with CAD. On physical exam, I palpate thickening in the right breast at 4 o'clock 14 cm from the nipple and in the right breast at 4 o'clock 12 cm from the nipple. There is visible scarring in these areas from the reduction mammoplasty. Targeted ultrasound is performed, showing normal tissue in the areas of clinical concern at 4 o'clock 14 cm from the nipple and at 4 o'clock 12 cm from the nipple. No solid mass, abnormal shadowing or distortion visualized. IMPRESSION: No evidence of malignancy in either breast. RECOMMENDATION: Bilateral screening mammogram in 1 year is recommended. I have discussed the findings and recommendations with the patient. If applicable, a reminder letter will be sent to the patient regarding the next appointment. BI-RADS CATEGORY  1: Negative. Electronically Signed   By: Lillia Mountain M.D.   On: 11/14/2018 11:23   Assessment  The primary encounter diagnosis was Chronic pain syndrome. Diagnoses of Chronic hip pain (Left), Chronic low back pain (Primary Source of Pain) (Bilateral) (L>R), Chronic hip pain (Secondary source of pain) (Bilateral) (L>R), Chronic knee pain (Tertiary source of pain) (Bilateral) (L>R), Neurogenic pain, Gastroesophageal reflux disease with esophagitis, Osteoarthritis involving multiple joints, Vitamin D insufficiency, and Elevated sed rate were also pertinent  to this visit.  Plan of Care  I have discontinued Kelly Rollins. Punch's meloxicam and omeprazole. I have also changed her gabapentin. Additionally, I am having her start on esomeprazole and meloxicam. Lastly, I am having her maintain her metoprolol succinate, venlafaxine XR, insulin detemir, senna-docusate, ondansetron, and amLODipine.  Pharmacotherapy (Medications Ordered): Meds ordered this encounter  Medications  . gabapentin (NEURONTIN) 100 MG capsule    Sig: Take 1 capsule (100 mg total) by mouth at bedtime for 15 days, THEN 2 capsules (200 mg total) at bedtime for 15 days, THEN 3 capsules (300 mg  total) at bedtime.    Dispense:  90 capsule    Refill:  4    Do not place this medication, or any other prescription from our practice, on "Automatic Refill". Patient may have prescription filled one day early if pharmacy is closed on scheduled refill date.  . esomeprazole (NEXIUM) 20 MG capsule    Sig: Take 1 capsule (20 mg total) by mouth at bedtime.    Dispense:  90 capsule    Refill:  3    Fill one day early if pharmacy is closed on scheduled refill date. May substitute for generic if available.  . meloxicam (MOBIC) 15 MG tablet    Sig: Take 1 tablet (15 mg total) by mouth daily.    Dispense:  90 tablet    Refill:  1    Fill one day early if pharmacy is closed on scheduled refill date. May substitute for generic if available.   Orders:  Orders Placed This Encounter  Procedures  . HIP INJECTION    Standing Status:   Future    Standing Expiration Date:   12/27/2018    Scheduling Instructions:     Side: Left-sided     Sedation: With Sedation.     Timeframe: As soon as schedule allows   Follow-up plan:   Return for Procedure (w/ sedation): (L) Hip inj. #1.      Interventional management options: Planned, scheduled, and/or pending:   This will be followed at a later time by a diagnostic left IA hip joint injection #1    Considering:   Diagnostic bilateral IA hip joint injection  Diagnostic left IA hip joint injection #1    Palliative PRN treatment(s):   Diagnostic bilateral hip joint injection  Palliative bilateral lumbar facet block #4  Palliative left L1 transverse process injection #2  Palliative/Therapeutic righ lumbar facet RFA #2 (last done 06/23/2016) Palliative/Therapeutic left lumbar facet RFA #2 (last done 06/08/2016)      Recent Visits Date Type Provider Dept  11/17/18 Procedure visit Milinda Pointer, MD [facility]  11/16/18 Office Visit Milinda Pointer, MD [facility]  Showing recent visits within past 90 days and meeting all other  requirements   Today's Visits Date Type Provider Dept  11/28/18 Office Visit Milinda Pointer, MD [facility]  Showing today's visits and meeting all other requirements   Future Appointments No visits were found meeting these conditions.  Showing future appointments within next 90 days and meeting all other requirements   I discussed the assessment and treatment plan with the patient. The patient was provided an opportunity to ask questions and all were answered. The patient agreed with the plan and demonstrated an understanding of the instructions.  Patient advised to call back or seek an in-person evaluation if the symptoms or condition worsens.  Total duration of non-face-to-face encounter: 15 minutes.  Note by: Gaspar Cola, MD Date: 11/28/2018;  Time: 9:57 AM  Note: This dictation was prepared with Dragon dictation. Any transcriptional errors that may result from this process are unintentional.  Disclaimer:  * Given the special circumstances of the COVID-19 pandemic, the federal government has announced that the Office for Civil Rights (OCR) will exercise its enforcement discretion and will not impose penalties on physicians using telehealth in the event of noncompliance with regulatory requirements under the [place] (HIPAA) in connection with the good faith provision of telehealth during the XX123456 national public health emergency. ([place])

## 2018-11-28 ENCOUNTER — Other Ambulatory Visit: Payer: Self-pay

## 2018-11-28 ENCOUNTER — Telehealth: Payer: Self-pay

## 2018-11-28 ENCOUNTER — Ambulatory Visit: Payer: Medicare Other | Attending: Pain Medicine | Admitting: Pain Medicine

## 2018-11-28 DIAGNOSIS — M25552 Pain in left hip: Secondary | ICD-10-CM | POA: Diagnosis not present

## 2018-11-28 DIAGNOSIS — M159 Polyosteoarthritis, unspecified: Secondary | ICD-10-CM

## 2018-11-28 DIAGNOSIS — G8929 Other chronic pain: Secondary | ICD-10-CM

## 2018-11-28 DIAGNOSIS — E559 Vitamin D deficiency, unspecified: Secondary | ICD-10-CM

## 2018-11-28 DIAGNOSIS — R700 Elevated erythrocyte sedimentation rate: Secondary | ICD-10-CM

## 2018-11-28 DIAGNOSIS — M25562 Pain in left knee: Secondary | ICD-10-CM

## 2018-11-28 DIAGNOSIS — M25561 Pain in right knee: Secondary | ICD-10-CM

## 2018-11-28 DIAGNOSIS — M545 Low back pain: Secondary | ICD-10-CM

## 2018-11-28 DIAGNOSIS — M25551 Pain in right hip: Secondary | ICD-10-CM

## 2018-11-28 DIAGNOSIS — G894 Chronic pain syndrome: Secondary | ICD-10-CM | POA: Diagnosis not present

## 2018-11-28 DIAGNOSIS — M792 Neuralgia and neuritis, unspecified: Secondary | ICD-10-CM

## 2018-11-28 DIAGNOSIS — K2100 Gastro-esophageal reflux disease with esophagitis, without bleeding: Secondary | ICD-10-CM

## 2018-11-28 DIAGNOSIS — M8949 Other hypertrophic osteoarthropathy, multiple sites: Secondary | ICD-10-CM

## 2018-11-28 MED ORDER — GABAPENTIN 100 MG PO CAPS: ORAL_CAPSULE | ORAL | 4 refills | Status: DC

## 2018-11-28 MED ORDER — ESOMEPRAZOLE MAGNESIUM 20 MG PO CPDR: 20.0000 mg | DELAYED_RELEASE_CAPSULE | Freq: Every day | ORAL | 3 refills | Status: DC

## 2018-11-28 MED ORDER — MELOXICAM 15 MG PO TABS: 15.0000 mg | ORAL_TABLET | Freq: Every day | ORAL | 1 refills | Status: DC

## 2018-11-28 NOTE — Progress Notes (Signed)
Test: ESR (Erythrocyte Sedimentation Rate) levels Finding(s): Elevated  Normal Level(s): below 30 mm/hr. Clinical significance: The sed rate is an acute phase reactant that indirectly measures the degree of inflammation present in the body. It can be acute, developing rapidly after trauma, injury or infection, for example, or can occur over an extended time (chronic) with conditions such as autoimmune diseases or cancer. The ESR is not diagnostic; it is a non-specific, screening test that may be elevated in a number of these different conditions. It provides general information about the presence or absence of an inflammatory condition. Signs and symptoms may include: None Possible causes:  - Most common: inflammation Patient Recommendation(s): Unless contraindicated, consider the use of anti-inflammatory diet and medications. (visit http://inflammationfactor.com/ ) ___________________________________________________________________________________ 

## 2018-11-28 NOTE — Patient Instructions (Signed)

## 2018-12-08 ENCOUNTER — Ambulatory Visit: Payer: Medicare Other | Admitting: Pain Medicine

## 2018-12-12 ENCOUNTER — Telehealth: Payer: Self-pay | Admitting: Pain Medicine

## 2018-12-12 ENCOUNTER — Ambulatory Visit: Payer: Medicare Other | Admitting: Pain Medicine

## 2018-12-12 NOTE — Telephone Encounter (Signed)
Spoke with patient and she seems to be having several issues with her recently prescribed medications and her pain. Suggested that she make a virtual visit appointment with Dr. Dossie Arbour to discuss. She agrees with POC.

## 2018-12-12 NOTE — Telephone Encounter (Signed)
Patient is having a full blown pancreatic attack, cant take the gabapentin, makes her too sleepy, thinks the meloxicam is what set of her pancreatic attack. She has an appt coming Thurs. But cannot take the pain she is having and a procedure as well. She is taking the omepresole, cannot afford the Nexium. Needs to get something for pain. She is going to cancel thurs procedure appt., just cant do it at this time

## 2018-12-13 ENCOUNTER — Encounter: Payer: Self-pay | Admitting: Pain Medicine

## 2018-12-14 ENCOUNTER — Ambulatory Visit: Payer: Medicare Other | Attending: Pain Medicine | Admitting: Pain Medicine

## 2018-12-14 ENCOUNTER — Other Ambulatory Visit: Payer: Self-pay

## 2018-12-14 DIAGNOSIS — M545 Low back pain, unspecified: Secondary | ICD-10-CM

## 2018-12-14 DIAGNOSIS — M25551 Pain in right hip: Secondary | ICD-10-CM

## 2018-12-14 DIAGNOSIS — M25552 Pain in left hip: Secondary | ICD-10-CM

## 2018-12-14 DIAGNOSIS — M792 Neuralgia and neuritis, unspecified: Secondary | ICD-10-CM | POA: Diagnosis not present

## 2018-12-14 DIAGNOSIS — M47816 Spondylosis without myelopathy or radiculopathy, lumbar region: Secondary | ICD-10-CM

## 2018-12-14 DIAGNOSIS — G8929 Other chronic pain: Secondary | ICD-10-CM

## 2018-12-14 MED ORDER — METHYLPREDNISOLONE 4 MG PO TBPK: ORAL_TABLET | ORAL | 0 refills | Status: DC

## 2018-12-14 MED ORDER — TIZANIDINE HCL 4 MG PO TABS: 4.0000 mg | ORAL_TABLET | Freq: Three times a day (TID) | ORAL | 0 refills | Status: DC | PRN

## 2018-12-14 MED ORDER — PREGABALIN 25 MG PO CAPS: ORAL_CAPSULE | ORAL | 0 refills | Status: DC

## 2018-12-14 NOTE — Progress Notes (Signed)
Pain Management Virtual Encounter Note - Virtual Visit via Telephone Telehealth (real-time audio visits between healthcare provider and patient).   Patient's Phone No. & Preferred Pharmacy:  [phone] (home); [phone] (mobile); (Preferred) [phone] [email or web address]  [place] [place] - [address] [place] [address] Phone: [phone] Fax: [phone]    Pre-screening note:  Our staff contacted [name] and offered her an "in person", "face-to-face" appointment versus a telephone encounter. She indicated preferring the telephone encounter, at this time.   Reason for Virtual Visit: COVID-19*  Social distancing based on CDC and AMA recommendations.   I contacted Kelly Rollins on 12/14/2018 via telephone.      I clearly identified myself as Gaspar Cola, MD. I verified that I was speaking with the correct person using two identifiers (Name: Kelly Rollins, and date of birth: [birth year]).  Advanced Informed Consent I sought verbal advanced consent from Kelly Rollins for virtual visit interactions. I informed Kelly Rollins of possible security and privacy concerns, risks, and limitations associated with providing "not-in-person" medical evaluation and management services. I also informed Kelly Rollins of the availability of "in-person" appointments. Finally, I informed her that there would be a charge for the virtual visit and that she could be  personally, fully or partially, financially responsible for it. Kelly Rollins expressed understanding and agreed to proceed.   Historic Elements   Kelly Rollins is a 48 y.o. year old, female patient evaluated today after her last encounter by our practice on 12/12/2018. [name]  has a past medical history of ABDOMINAL PAIN OTHER SPECIFIED SITE (02/21/2009), ABDOMINAL PAIN-LUQ (04/17/2009), Anal fissure, Asthma, Diabetes mellitus without complication ([place]),  [id number]), HEMATOCHEZIA (04/17/2009), Hypertension, IBS (irritable bowel syndrome), Migraine headache (11/06/2008), OVARIAN CYST, RIGHT (02/21/2009), PALPITATIONS, OCCASIONAL (04/17/2010), and Pancreatitis (10/14/2017). She also  has a past surgical history that includes Cesarean section; Vaginal hysterectomy; Cholecystectomy; Breast reduction surgery; Breast excisional biopsy (Left, 1997); and Reduction mammaplasty (Bilateral, 1997). Kelly Rollins has a current medication list which includes the following prescription(s): amlodipine, insulin detemir, metoprolol succinate, omeprazole, omeprazole, venlafaxine xr, methylprednisolone, pregabalin, and tizanidine. She  reports that she quit smoking about 4 years ago. Her smoking use included cigarettes. She has never used smokeless tobacco. She reports current alcohol use. She reports that she does not use drugs. [name] is allergic to atorvastatin; levofloxacin; morphine; penicillins; and tramadol hcl.   HPI  Today, she is being contacted for worsening of previously known (established) problem.  According to the nurses medication reconciliation, the gabapentin that I had written for this patient, is currently not being taken.  In addition, the meloxicam that I had written for her has also been marked as "patient not taking".  A review of the patient's imaging shows both SI joints to be within normal limits; both hip joints to be also within normal limits; the MRI of the lumbar spine done in 2018 shows "mild" bilateral facet arthropathy at L4-5 and L5-S1, as well as a broad-based disc bulge with a small central disc protrusion at the L5-S1 level with no evidence of neural foraminal or central canal stenosis.  On 11/17/2018 we did a third diagnostic bilateral lumbar facet block with 100% relief of the pain that lasted for the duration of the local anesthetic, followed by a decrease to a 50% relief that lasted over 2 weeks.  In view of these findings, I believe  that the next step would be to repeat the patient's bilateral  lumbar facet RFA.  The last time the left side was done was on 06/08/2016, followed by the right side on 06/23/2016.  These provided the patient with excellent relief of the pain until recently when they wore off.  Its been now over 2 years since that treatment and clearly radiofrequency ablation, on the average will last anywhere from 3 months to 18 months, suggesting that it is time to have it repeated.  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 Over-the-counter oral analgesic medications (i.e.: ibuprofen, naproxen, etc.) 2. Anti-inflammatory medications 3. Muscle relaxants 4. Membrane stabilizers 5. Opioids 6. Physical therapy (PT), chiropractic manipulation, and/or home exercise program (HEP). 7. Modalities (Heat, ice, etc.) 8. Invasive techniques such as nerve blocks.  Kelly Rollins has attained greater than 50% reduction in pain from at least two (2) diagnostic medial branch blocks conducted in separate occasions. For this reason, I believe it is medically necessary to proceed with Non-Pulsed Radiofrequency Ablation for the purpose of attempting to prolong the duration of the benefits seen with the diagnostic injections.  The plan is to repeat the bilateral lumbar facet radiofrequency ablation, starting with the left side.  Pharmacotherapy Assessment  Analgesic: No opioid analgesic prescriptions from our practice.  MME/day:15mg /day.   Monitoring: Pharmacotherapy: No side-effects or adverse reactions reported. [place] PMP: PDMP reviewed during this  encounter.       Compliance: No problems identified. Effectiveness: Clinically acceptable. Plan: Refer to "POC".  UDS:  Summary  Date Value Ref Range Status  01/18/2017 FINAL  Final    Comment:    ==================================================================== TOXASSURE SELECT 13 (MW) ==================================================================== Test                             Result       Flag       Units Drug Absent but Declared for Prescription Verification   Oxycodone                      Not Detected UNEXPECTED ng/mg creat ==================================================================== Test                      Result    Flag   Units      Ref Range   Creatinine              209              mg/dL      >=20 ==================================================================== Declared Medications:  The flagging and interpretation on this report are based on the  following declared medications.  Unexpected results may arise from  inaccuracies in the declared medications.  **Note: The testing scope of this panel includes these medications:  Oxycodone  **Note: The testing scope of this panel does not include following  reported medications:  Acetaminophen  Albuterol  Aspirin  Caffeine  Gabapentin  Ibuprofen  Magnesium  Metformin  Metoprolol (Toprol)  Multivitamin (MVI)  Ondansetron (Zofran)  Venlafaxine (Effexor)  Vitamin C  Vitamin D3 ==================================================================== For clinical consultation, please call [phone]. ====================================================================    Laboratory Chemistry Profile (12 mo)  Renal: No results found  for requested labs within last 8760 hours.  Lab Results  Component Value Date   GFR 88.95 03/19/2010   GFRAA >60 11/05/2017   GFRNONAA >60 11/05/2017   Hepatic: No results found for requested labs within last 8760 hours. Lab Results  Component Value Date    AST 48 (H) 11/05/2017   ALT 41 11/05/2017   Other: 11/16/2018: 25-Hydroxy, Vitamin D 23; 25-Hydroxy, Vitamin D-2 4.0; 25-Hydroxy, Vitamin D-3 19; CRP 2; Sed Rate 40; Vitamin B-12 644 Note: Above Lab results reviewed.  Imaging  Last 90 days:  [facility] C-arm 1-60 Min No Report  Result Date: 11/17/2018 Fluoro was used, but no Radiologist interpretation will be provided. Please refer to "NOTES" tab for provider progress note.  US Breast Ltd Uni Right Inc Axilla  Result Date: 11/14/2018 CLINICAL DATA:  History of reduction mammoplasty in 1997. Patient complains of palpable abnormalities. The patient states that she thinks the palpable abnormalities are scar tissue and likely clinically stable. EXAM: DIGITAL DIAGNOSTIC BILATERAL MAMMOGRAM WITH CAD AND TOMO ULTRASOUND RIGHT BREAST COMPARISON:  None. ACR Breast Density Category b: There are scattered areas of fibroglandular density. FINDINGS: There are changes of reduction mammoplasty. No suspicious mass or malignant type microcalcifications detected in either breast. Spot tangential view of the area of clinical concern in the right breast shows normal fibroglandular tissue. Mammographic images were processed with CAD. On physical exam, I palpate thickening in the right breast at 4 o'clock 14 cm from the nipple and in the right breast at 4 o'clock 12 cm from the nipple. There is visible scarring in these areas from the reduction mammoplasty. Targeted ultrasound is performed, showing normal tissue in the areas of clinical concern at 4 o'clock 14 cm from the nipple and at 4 o'clock 12 cm from the nipple. No solid mass, abnormal shadowing or distortion visualized. IMPRESSION: No evidence of malignancy in either breast. RECOMMENDATION: Bilateral screening mammogram in 1 year is recommended. I have discussed the findings and recommendations with the patient. If applicable, a reminder letter will be sent to the patient regarding the next appointment. BI-RADS  CATEGORY  1: Negative. Electronically Signed   By: Lillia Mountain M.D.   On: 11/14/2018 11:23   Mm Diag Breast Tomo Bilateral  Result Date: 11/14/2018 CLINICAL DATA:  History of reduction mammoplasty in 1997. Patient complains of palpable abnormalities. The patient states that she thinks the palpable abnormalities are scar tissue and likely clinically stable. EXAM: DIGITAL DIAGNOSTIC BILATERAL MAMMOGRAM WITH CAD AND TOMO ULTRASOUND RIGHT BREAST COMPARISON:  None. ACR Breast Density Category b: There are scattered areas of fibroglandular density. FINDINGS: There are changes of reduction mammoplasty. No suspicious mass or malignant type microcalcifications detected in either breast. Spot tangential view of the area of clinical concern in the right breast shows normal fibroglandular tissue. Mammographic images were processed with CAD. On physical exam, I palpate thickening in the right breast at 4 o'clock 14 cm from the nipple and in the right breast at 4 o'clock 12 cm from the nipple. There is visible scarring in these areas from the reduction mammoplasty. Targeted ultrasound is performed, showing normal tissue in the areas of clinical concern at 4 o'clock 14 cm from the nipple and at 4 o'clock 12 cm from the nipple. No solid mass, abnormal shadowing or distortion visualized. IMPRESSION: No evidence of malignancy in either breast. RECOMMENDATION: Bilateral screening mammogram in 1 year is recommended. I have discussed the findings and recommendations with the patient. If applicable, a reminder letter  will be sent to the patient regarding the next appointment. BI-RADS CATEGORY  1: Negative. Electronically Signed   By: Lillia Mountain M.D.   On: 11/14/2018 11:23    Assessment  The primary encounter diagnosis was Lumbar facet syndrome (Bilateral) (L>R). Diagnoses of Lumbar facet arthropathy (Multilevel) (Bilateral), Chronic low back pain (Primary Source of Pain) (Bilateral) (L>R), Chronic hip pain (Secondary source of  pain) (Bilateral) (L>R), Neurogenic pain, and Acute low back pain were also pertinent to this visit.  Plan of Care  I have discontinued Kelly Rollins. Kelly Rollins senna-docusate, ondansetron, gabapentin, esomeprazole, and meloxicam. I am also having her start on pregabalin and tiZANidine. Additionally, I am having her maintain her metoprolol succinate, venlafaxine XR, insulin detemir, amLODipine, omeprazole, omeprazole, and methylPREDNISolone.  Pharmacotherapy (Medications Ordered): Meds ordered this encounter  Medications  . pregabalin (LYRICA) 25 MG capsule    Sig: Take 1 capsule (25 mg total) by mouth daily for 7 days, THEN 1 capsule (25 mg total) 2 (two) times daily for 7 days, THEN 1 capsule (25 mg total) 3 (three) times daily for 16 days.    Dispense:  69 capsule    Refill:  0    Fill one day early if pharmacy is closed on scheduled refill date. May substitute for generic if available.  Marland Kitchen tiZANidine (ZANAFLEX) 4 MG tablet    Sig: Take 1 tablet (4 mg total) by mouth every 8 (eight) hours as needed for muscle spasms.    Dispense:  90 tablet    Refill:  0    Do not place this medication, or any other prescription from our practice, on "Automatic Refill". Patient may have prescription filled one day early if pharmacy is closed on scheduled refill date.  . methylPREDNISolone (MEDROL) 4 MG TBPK tablet    Sig: Follow package instructions.    Dispense:  21 tablet    Refill:  0    Do not place this medication, or any other prescription from our practice, on "Automatic Refill". Patient may have prescription filled one day early if pharmacy is closed on scheduled refill date.   Orders:  Orders Placed This Encounter  Procedures  . RFA - Lumbar Facet (Schedule)    Standing Status:   Future    Standing Expiration Date:   06/12/2020    Scheduling Instructions:     Side(s): Left-sided     Level: L3-4, L4-5, & L5-S1 Facets (L2, L3, L4, L5, & S1 Medial Branch Nerves)     Sedation: With Sedation      Scheduling Timeframe: As soon as pre-approved    Order Specific Question:   Where will this procedure be performed?    Answer:   [facility]   Follow-up plan:   Return for RFA (w/ sedation): (L) L-FCT RFA #2.      Interventional treatment options: Planned, scheduled, and/or pending:   Palliative left-sided lumbar facet RFA #2 under fluoroscopic guidance and IV sedation   Under consideration:   Diagnostic bilateral IA hip joint injection  Diagnostic left IA hip joint injection    Therapeutic/palliative (PRN):   Palliative bilateral lumbar facet block #4  Palliative left L1 transverse process injection #2  Palliative/Therapeutic righ lumbar facet RFA #2 (last done 06/23/2016) Palliative/Therapeutic left lumbar facet RFA #2 (last done 06/08/2016)    Recent Visits Date Type Provider Dept  11/28/18 Office Visit Milinda Pointer, MD [facility]  11/17/18 Procedure visit Milinda Pointer, MD [facility]  11/16/18 Office Visit Milinda Pointer, MD  [facility]  Showing recent visits within past 90 days and meeting all other requirements   Today's Visits Date Type Provider Dept  12/14/18 Telemedicine Milinda Pointer, MD [facility]  Showing today's visits and meeting all other requirements   Future Appointments No visits were found meeting these conditions.  Showing future appointments within next 90 days and meeting all other requirements   I discussed the assessment and treatment plan with the patient. The patient was provided an opportunity to ask questions and all were answered. The patient agreed with the plan and demonstrated an understanding of the instructions.  Patient advised to call back or seek an in-person evaluation if the symptoms or condition worsens.  Total duration of non-face-to-face encounter: 16 minutes.  Note by: Gaspar Cola, MD Date: 12/14/2018; Time: 5:22 PM  Note: This dictation was prepared  with Dragon dictation. Any transcriptional errors that may result from this process are unintentional.  Disclaimer:  * Given the special circumstances of the COVID-19 pandemic, the federal government has announced that the Office for Civil Rights (OCR) will exercise its enforcement discretion and will not impose penalties on physicians using telehealth in the event of noncompliance with regulatory requirements under the [place] (HIPAA) in connection with the good faith provision of telehealth during the XX123456 national public health emergency. ([place])

## 2018-12-15 ENCOUNTER — Ambulatory Visit: Payer: Medicare Other | Admitting: Pain Medicine

## 2018-12-27 ENCOUNTER — Telehealth: Payer: Self-pay

## 2018-12-27 NOTE — Telephone Encounter (Signed)
She had to cancel her RFA because her daughter has been directly exposed to covid and they are undergoing tests, she doesn't want to take any chances. She wants to know if there is any medication he can give her to help with the pain. She is taking the lyrica but it's not helping that much.

## 2018-12-27 NOTE — Telephone Encounter (Signed)
Spoke with patient and she would like to have some pain medication since she has had to put off RFA appt.  I asked her about the medrol dose pack that was ordered on 12/14/2018 and she has not taken this.  Is very adamant that she have something for pain.  I explained to her that this will require a VV with Dr Dossie Arbour to discuss.  Option to do this or first try Medrol dose pack first to see if this might give her some relief.  Patient would like to have VV first.

## 2018-12-28 ENCOUNTER — Encounter: Payer: Self-pay | Admitting: Pain Medicine

## 2019-01-02 ENCOUNTER — Ambulatory Visit: Payer: Medicare Other | Attending: Pain Medicine | Admitting: Pain Medicine

## 2019-01-02 ENCOUNTER — Other Ambulatory Visit: Payer: Self-pay

## 2019-01-02 DIAGNOSIS — M25551 Pain in right hip: Secondary | ICD-10-CM

## 2019-01-02 DIAGNOSIS — G4733 Obstructive sleep apnea (adult) (pediatric): Secondary | ICD-10-CM

## 2019-01-02 DIAGNOSIS — M25562 Pain in left knee: Secondary | ICD-10-CM

## 2019-01-02 DIAGNOSIS — M25561 Pain in right knee: Secondary | ICD-10-CM | POA: Diagnosis not present

## 2019-01-02 DIAGNOSIS — M792 Neuralgia and neuritis, unspecified: Secondary | ICD-10-CM

## 2019-01-02 DIAGNOSIS — G894 Chronic pain syndrome: Secondary | ICD-10-CM

## 2019-01-02 DIAGNOSIS — M25552 Pain in left hip: Secondary | ICD-10-CM

## 2019-01-02 DIAGNOSIS — G8929 Other chronic pain: Secondary | ICD-10-CM

## 2019-01-02 DIAGNOSIS — M545 Low back pain, unspecified: Secondary | ICD-10-CM

## 2019-01-02 MED ORDER — PREGABALIN 25 MG PO CAPS: 25.0000 mg | ORAL_CAPSULE | Freq: Three times a day (TID) | ORAL | 0 refills | Status: DC

## 2019-01-02 MED ORDER — TIZANIDINE HCL 4 MG PO TABS: 4.0000 mg | ORAL_TABLET | Freq: Three times a day (TID) | ORAL | 0 refills | Status: DC | PRN

## 2019-01-02 NOTE — Progress Notes (Signed)
Pain Management Virtual Encounter Note - Virtual Visit via Telephone Telehealth (real-time audio visits between healthcare provider and patient).   Patient's Phone No. & Preferred Pharmacy:  [phone] (home); [phone] (mobile); (Preferred) [phone] [email or web address]  [place], [place] - [facility] [place] [address] Phone: [phone] Fax: [phone]    Pre-screening note:  Our staff contacted Kelly Rollins and offered her an "in person", "face-to-face" appointment versus a telephone encounter. She indicated preferring the telephone encounter, at this time.   Reason for Virtual Visit: COVID-19*  Social distancing based on CDC and AMA recommendations.   I contacted Kelly Rollins on 01/02/2019 via telephone.      I clearly identified myself as Gaspar Cola, MD. I verified that I was speaking with the correct person using two identifiers (Name: Kelly Rollins, and date of birth: 16-Jul-1970).  Advanced Informed Consent I sought verbal advanced consent from Kelly Rollins for virtual visit interactions. I informed Kelly Rollins of possible security and privacy concerns, risks, and limitations associated with providing "not-in-person" medical evaluation and management services. I also informed Kelly Rollins of the availability of "in-person" appointments. Finally, I informed her that there would be a charge for the virtual visit and that she could be  personally, fully or partially, financially responsible for it. Kelly Rollins expressed understanding and agreed to proceed.   Historic Elements   Kelly Rollins is a 48 y.o. year old, female patient evaluated today after her last encounter by our practice on 12/27/2018. Kelly Rollins  has a past medical history of ABDOMINAL PAIN OTHER SPECIFIED SITE (02/21/2009), ABDOMINAL PAIN-LUQ (04/17/2009), Anal fissure, Asthma, Diabetes mellitus without complication ([place]),  [id number]), HEMATOCHEZIA (04/17/2009), Hypertension, IBS (irritable bowel syndrome), Migraine headache (11/06/2008), OVARIAN CYST, RIGHT (02/21/2009), PALPITATIONS, OCCASIONAL (04/17/2010), and Pancreatitis (10/14/2017). She also  has a past surgical history that includes Cesarean section; Vaginal hysterectomy; Cholecystectomy; Breast reduction surgery; Breast excisional biopsy (Left, 1997); and Reduction mammaplasty (Bilateral, 1997). Ms. Areola has a current medication list which includes the following prescription(s): amlodipine, artificial tears, methylprednisolone, metoprolol tartrate, novolog mix 70/30, omeprazole, pregabalin, rosuvastatin, tizanidine, and venlafaxine xr. She  reports that she quit smoking about 4 years ago. Her smoking use included cigarettes. She has never used smokeless tobacco. She reports current alcohol use. She reports that she does not use drugs. [name] is allergic to atorvastatin; levofloxacin; morphine; penicillins; and tramadol hcl.   HPI  Today, she is being contacted for medication management.  The patient requested this visit to "discuss medications".  We had started the patient on a Lyrica titration on 12/14/2018.  Medrol Dosepak given on 12/14/2018 as well as a prescription for Zanaflex 4 mg to be taken every 8 hours as needed.  She is currently on no NSAIDs.  The patient's primary area of pain is that of the lower back, proven to be secondary to a bilateral lumbar facet syndrome.  Last treated on 11/17/2018 with a bilateral lumbar facet block #3 with 100% relief of the pain for the duration of local anesthetic, followed by more than 50% relief after 2-week follow-up.  (See 11/28/2018 follow-up note.) The patient was last treated with a bilateral lumbar facet RFA around 2018.  (Left side on 06/08/2016 and right side on 06/23/2016) This seems to have lasted until recently.  Lumbar MRI done on 09/29/2016 confirmed bilateral, (Multilevel) lumbar facet  arthropathy.  Medical Necessity: Kelly Rollins has experienced debilitating chronic pain from the Lumbosacral Facet Syndrome (Spondylosis  without myelopathy or radiculopathy, lumbosacral region [M47.817]) that has persisted for longer than three months of failed non-surgical care and has either failed to respond, or was unable to tolerate, or simply did not get enough benefit from other more conservative therapies including, but not limited to: 1. Over-the-counter oral analgesic medications (i.e.: ibuprofen, naproxen, etc.) 2. Anti-inflammatory medications 3. Muscle relaxants 4. Membrane stabilizers 5. Opioids 6. Physical therapy (PT), chiropractic manipulation, and/or home exercise program (HEP). 7. Modalities (Heat, ice, etc.) 8. Invasive techniques such as nerve blocks and prior successful, bilateral, lumbar facet RFA in 2018 lasting until 2020.  Kelly Rollins has attained greater than 50% (100% for the duration of the local anesthetic) reduction in pain from at least two (2) diagnostic medial branch blocks conducted in separate occasions. For this reason, I believe it is medically necessary to proceed with bilateral Non-Pulsed Radiofrequency Ablation for the purpose of attempting to prolong the duration of the benefits seen with the diagnostic injections.  In addition, today I also provided the patient with some information regarding Preemptive Analgesia.  Pharmacotherapy Assessment  Analgesic: No opioid analgesic prescriptions from our practice.  Hydrocodone/APAP 5/325, 1 tab PO q 8 hrs (15 mg/day of hydrocodone) (written by Kelly Drafts. Whitaker, PA-C) #90 (on 10/31/2018) MME/day:15mg /day.   Monitoring: Pharmacotherapy: No side-effects or adverse reactions reported. [place] PMP: PDMP reviewed during this encounter.       Compliance: No problems identified. Effectiveness: Clinically acceptable. Plan: Refer to "POC".  UDS:  Summary  Date Value Ref Range Status  01/18/2017 FINAL  Final     Comment:    ==================================================================== TOXASSURE SELECT 13 (MW) ==================================================================== Test                             Result       Flag       Units Drug Absent but Declared for Prescription Verification   Oxycodone                      Not Detected UNEXPECTED ng/mg creat ==================================================================== Test                      Result    Flag   Units      Ref Range   Creatinine              209              mg/dL      >=20 ==================================================================== Declared Medications:  The flagging and interpretation on this report are based on the  following declared medications.  Unexpected results may arise from  inaccuracies in the declared medications.  **Note: The testing scope of this panel includes these medications:  Oxycodone  **Note: The testing scope of this panel does not include following  reported medications:  Acetaminophen  Albuterol  Aspirin  Caffeine  Gabapentin  Ibuprofen  Magnesium  Metformin  Metoprolol (Toprol)  Multivitamin (MVI)  Ondansetron (Zofran)  Venlafaxine (Effexor)  Vitamin C  Vitamin D3 ==================================================================== For clinical consultation, please call [phone]. ====================================================================    Laboratory Chemistry Profile (12 mo)  Renal: No results found for requested labs within last 8760 hours.  Lab Results  Component Value Date   GFR 88.95 03/19/2010   GFRAA >60 11/05/2017   GFRNONAA >60 11/05/2017   Hepatic: No results found for requested labs within last 8760 hours. Lab Results  Component Value Date  AST 48 (H) 11/05/2017   ALT 41 11/05/2017   Other: 11/16/2018: 25-Hydroxy, Vitamin D 23; 25-Hydroxy, Vitamin D-2 4.0; 25-Hydroxy, Vitamin D-3 19; CRP 2; Sed Rate 40; Vitamin B-12 644 Note:  Above Lab results reviewed.  Imaging  DG PAIN CLINIC C-ARM 1-60 MIN NO REPORT Fluoro was used, but no Radiologist interpretation will be provided.  Please refer to "NOTES" tab for provider progress note.   Assessment  The primary encounter diagnosis was Chronic pain syndrome. Diagnoses of Chronic low back pain (Primary Source of Pain) (Bilateral) (L>R), Chronic hip pain (Secondary source of pain) (Bilateral) (L>R), Chronic knee pain (Tertiary source of pain) (Bilateral) (L>R), Obstructive sleep apnea syndrome, and Neurogenic pain were also pertinent to this visit.  Plan of Care  Problem-specific:  No problem-specific Assessment & Plan notes found for this encounter.  I have discontinued Clearnce Sorrel. Kopischke's metoprolol succinate and insulin detemir. I have also changed her pregabalin. Additionally, I am having her maintain her venlafaxine XR, amLODipine, omeprazole, methylPREDNISolone, Artificial Tears, NovoLOG Mix 70/30, rosuvastatin, metoprolol tartrate, and tiZANidine.  Pharmacotherapy (Medications Ordered): Meds ordered this encounter  Medications  . tiZANidine (ZANAFLEX) 4 MG tablet    Sig: Take 1 tablet (4 mg total) by mouth every 8 (eight) hours as needed for muscle spasms.    Dispense:  90 tablet    Refill:  0    Do not place this medication, or any other prescription from our practice, on "Automatic Refill". Patient may have prescription filled one day early if pharmacy is closed on scheduled refill date.  . pregabalin (LYRICA) 25 MG capsule    Sig: Take 1 capsule (25 mg total) by mouth 3 (three) times daily.    Dispense:  180 capsule    Refill:  0    Fill one day early if pharmacy is closed on scheduled refill date. May substitute for generic if available.   Orders:  Orders Placed This Encounter  Procedures  . RFA - Lumbar Facet (Schedule)    Standing Status:   Future    Standing Expiration Date:   07/01/2020    Scheduling Instructions:     Side(s): Left-sided      Level: L3-4, L4-5, & L5-S1 Facets (L2, L3, L4, L5, & S1 Medial Branch Nerves)     Sedation: With Sedation     Scheduling Timeframe: As soon as pre-approved    Order Specific Question:   Where will this procedure be performed?    Answer:   [facility]  . Referral for Sleep Apnea Evaluation [facility])    Referral Priority:   Routine    Referral Type:   Consultation    Referral Reason:   Specialty Services Required    Referral Location:   [place]    Number of Visits Requested:   1  . Sleep Apnea Test (Split Night Study) [facility])    Contact [facility] at [facility]. Tel.: [phone] [address]  [place], [place] [address]    Standing Status:   Future    Standing Expiration Date:   01/02/2020    Scheduling Instructions:     Referral is for: Evaluation and treatment of Sleep Apnea, unspecified (G47.30).      Note: If CPAP is indicated, please order and manage.     Provider preference: No preference    Order Specific Question:   Where should this test be performed:    Answer:   [place]  Follow-up plan:   Return in 2 months (on 03/04/2019) for (VV), (MM), in addition, RFA (w/ sedation): (L) L-FCT RFA #2, (ASAP).      Interventional treatment options: Planned, scheduled, and/or pending:   Palliative left-sided lumbar facet RFA #2 under fluoroscopic guidance and IV sedation   Under consideration:   Diagnostic bilateral IA hip joint injection  Diagnostic left IA hip joint injection    Therapeutic/palliative (PRN):   Palliative bilateral lumbar facet block #4  Palliative left L1 transverse process injection #2  Palliative/Therapeutic righ lumbar facet RFA #2 (last done 06/23/2016) Palliative/Therapeutic left lumbar facet RFA #2 (last done 06/08/2016)    Recent Visits Date Type Provider Dept  12/14/18 Telemedicine Milinda Pointer, MD [facility]  11/28/18 Office Visit Milinda Pointer,  MD [facility]  11/17/18 Procedure visit Milinda Pointer, MD [facility]  11/16/18 Office Visit Milinda Pointer, MD [facility]  Showing recent visits within past 90 days and meeting all other requirements   Today's Visits Date Type Provider Dept  01/02/19 Telemedicine Milinda Pointer, MD [facility]  Showing today's visits and meeting all other requirements   Future Appointments No visits were found meeting these conditions.  Showing future appointments within next 90 days and meeting all other requirements   I discussed the assessment and treatment plan with the patient. The patient was provided an opportunity to ask questions and all were answered. The patient agreed with the plan and demonstrated an understanding of the instructions.  Patient advised to call back or seek an in-person evaluation if the symptoms or condition worsens.  Total duration of non-face-to-face encounter: 15 minutes.  Note by: Gaspar Cola, MD Date: 01/02/2019; Time: 12:21 PM  Note: This dictation was prepared with Dragon dictation. Any transcriptional errors that may result from this process are unintentional.  Disclaimer:  * Given the special circumstances of the COVID-19 pandemic, the federal government has announced that the Office for Civil Rights (OCR) will exercise its enforcement discretion and will not impose penalties on physicians using telehealth in the event of noncompliance with regulatory requirements under the [place] (HIPAA) in connection with the good faith provision of telehealth during the XX123456 national public health emergency. ([place])

## 2019-01-02 NOTE — Patient Instructions (Signed)

## 2019-01-03 ENCOUNTER — Ambulatory Visit: Payer: Medicare Other | Admitting: Pain Medicine

## 2019-02-07 ENCOUNTER — Encounter: Payer: Self-pay | Admitting: Pain Medicine

## 2019-02-07 ENCOUNTER — Ambulatory Visit
Admission: RE | Admit: 2019-02-07 | Discharge: 2019-02-07 | Disposition: A | Discharge status: Home / Self Care | Payer: Medicare Other | Source: Ambulatory Visit | Attending: Pain Medicine | Admitting: Pain Medicine

## 2019-02-07 ENCOUNTER — Ambulatory Visit (HOSPITAL_BASED_OUTPATIENT_CLINIC_OR_DEPARTMENT_OTHER): Payer: Medicare Other | Admitting: Pain Medicine

## 2019-02-07 ENCOUNTER — Other Ambulatory Visit: Payer: Self-pay

## 2019-02-07 VITALS — BP 135/85 | HR 96 | Temp 97.4°F | Resp 17 | Ht 60.0 in | Wt 183.0 lb

## 2019-02-07 DIAGNOSIS — G8929 Other chronic pain: Secondary | ICD-10-CM | POA: Diagnosis present

## 2019-02-07 DIAGNOSIS — M5137 Other intervertebral disc degeneration, lumbosacral region: Secondary | ICD-10-CM | POA: Insufficient documentation

## 2019-02-07 DIAGNOSIS — M545 Low back pain: Secondary | ICD-10-CM | POA: Insufficient documentation

## 2019-02-07 DIAGNOSIS — G8918 Other acute postprocedural pain: Secondary | ICD-10-CM

## 2019-02-07 DIAGNOSIS — M47816 Spondylosis without myelopathy or radiculopathy, lumbar region: Secondary | ICD-10-CM

## 2019-02-07 DIAGNOSIS — M47817 Spondylosis without myelopathy or radiculopathy, lumbosacral region: Secondary | ICD-10-CM | POA: Diagnosis present

## 2019-02-07 MED ORDER — MIDAZOLAM HCL 5 MG/5ML IJ SOLN
1.0000 mg | INTRAMUSCULAR | Status: DC | PRN
  Administered 2019-02-07: 5 mg via INTRAVENOUS
  Filled 2019-02-07: qty 5

## 2019-02-07 MED ORDER — LIDOCAINE HCL 2 % IJ SOLN
20.0000 mL | Freq: Once | INTRAMUSCULAR | Status: AC
  Administered 2019-02-07: 400 mg
  Filled 2019-02-07: qty 20

## 2019-02-07 MED ORDER — FENTANYL CITRATE (PF) 100 MCG/2ML IJ SOLN
25.0000 ug | INTRAMUSCULAR | Status: DC | PRN
  Administered 2019-02-07: 100 ug via INTRAVENOUS
  Filled 2019-02-07: qty 2

## 2019-02-07 MED ORDER — ROPIVACAINE HCL 2 MG/ML IJ SOLN
9.0000 mL | Freq: Once | INTRAMUSCULAR | Status: AC
  Administered 2019-02-07: 10 mL via PERINEURAL
  Filled 2019-02-07: qty 10

## 2019-02-07 MED ORDER — OXYCODONE-ACETAMINOPHEN 5-325 MG PO TABS: 1.0000 | ORAL_TABLET | Freq: Four times a day (QID) | ORAL | 0 refills | Status: DC | PRN

## 2019-02-07 MED ORDER — LACTATED RINGERS IV SOLN
1000.0000 mL | Freq: Once | INTRAVENOUS | Status: AC
  Administered 2019-02-07: 11:00:00 1000 mL via INTRAVENOUS

## 2019-02-07 MED ORDER — TRIAMCINOLONE ACETONIDE 40 MG/ML IJ SUSP
40.0000 mg | Freq: Once | INTRAMUSCULAR | Status: AC
  Administered 2019-02-07: 11:00:00 40 mg
  Filled 2019-02-07: qty 1

## 2019-02-07 NOTE — Progress Notes (Signed)
Safety precautions to be maintained throughout the outpatient stay will include: orient to surroundings, keep bed in low position, maintain call bell within reach at all times, provide assistance with transfer out of bed and ambulation.  

## 2019-02-07 NOTE — Progress Notes (Signed)
PROVIDER NOTE: Information contained herein reflects review and annotations entered in association with encounter. Interpretation of such information and data should be left to medically-trained personnel. Information provided to patient can be located elsewhere in the medical record under "Patient Instructions". Document created using STT-dictation technology, any transcriptional errors that may result from process are unintentional.   Patient's Name: Kelly Rollins  MRN: [id number]  Referring Provider: Milinda Pointer, MD  DOB: 09/10/1970  PCP: [name], PA-C  DOS: 02/07/2019  Note by: Gaspar Cola, MD  Service setting: Ambulatory outpatient  Specialty: Interventional Pain Management  Patient type: Established  Location: [facility] (AMB) [facility]  Visit type: Interventional Procedure   Primary Reason for Visit: Interventional Pain Management Treatment. CC: Back Pain (lower)  Procedure:          Anesthesia, Analgesia, Anxiolysis:  Type: Thermal Lumbar Facet, Medial Branch Radiofrequency Ablation/Neurotomy  #2  Primary Purpose: Therapeutic Region: Posterolateral Lumbosacral Spine Level: L2, L3, L4, L5, & S1 Medial Branch Level(s). These levels will denervate the L3-4, L4-5, and the L5-S1 lumbar facet joints. Laterality: Left  Type: Moderate (Conscious) Sedation combined with Local Anesthesia Indication(s): Analgesia and Anxiety Route: Intravenous (IV) IV Access: Secured Sedation: Meaningful verbal contact was maintained at all times during the procedure  Local Anesthetic: Lidocaine 1-2%  Position: Prone   Indications: 1. Lumbar facet syndrome (Bilateral) (L>R)   2. Spondylosis without myelopathy or radiculopathy, lumbosacral region   3. Lumbar facet arthropathy (Multilevel) (Bilateral)   4. DDD (degenerative disc disease), lumbosacral   5. Chronic low back pain (Primary Source of Pain) (Bilateral) (L>R)    Kelly Rollins has been dealing with the above  chronic pain for longer than three months and has either failed to respond, was unable to tolerate, or simply did not get enough benefit from other more conservative therapies including, but not limited to: 1. Over-the-counter medications 2. Anti-inflammatory medications 3. Muscle relaxants 4. Membrane stabilizers 5. Opioids 6. Physical therapy and/or chiropractic manipulation 7. Modalities (Heat, ice, etc.) 8. Invasive techniques such as nerve blocks. Kelly Rollins has attained more than 50% relief of the pain from a series of diagnostic injections conducted in separate occasions.  Pain Score: Pre-procedure: 4 /10 Post-procedure: 4 /10  Pre-op Assessment:  Kelly Rollins is a 49 y.o. (year old), female patient, seen today for interventional treatment. She  has a past surgical history that includes Cesarean section; Vaginal hysterectomy; Cholecystectomy; Breast reduction surgery; Breast excisional biopsy (Left, 1997); and Reduction mammaplasty (Bilateral, 1997). Kelly Rollins has a current medication list which includes the following prescription(s): amlodipine, artificial tears, methylprednisolone, metoprolol tartrate, novolog mix 70/30, omeprazole, pregabalin, rosuvastatin, tizanidine, venlafaxine xr, oxycodone-acetaminophen, and [START ON 02/14/2019] oxycodone-acetaminophen, and the following Facility-Administered Medications: fentanyl and midazolam. Her primarily concern today is the Back Pain (lower)  Initial Vital Signs:  Pulse/HCG Rate: 96ECG Heart Rate: 88 Temp: 98.4 F (36.9 C) Resp: 18 BP: (!) 145/90 SpO2: 100 %  BMI: Estimated body mass index is 35.74 kg/m as calculated from the following:   Height as of this encounter: 5' (1.524 m).   Weight as of this encounter: 183 lb (83 kg).  Risk Assessment: Allergies: Reviewed. She is allergic to atorvastatin; levofloxacin; morphine; penicillins; and tramadol hcl.  Allergy Precautions: None required Coagulopathies: Reviewed. None  identified.  Blood-thinner therapy: None at this time Active Infection(s): Reviewed. None identified. Kelly Rollins is afebrile  Site Confirmation: Kelly Rollins was asked to confirm the procedure and laterality before marking the site Procedure checklist:  Completed Consent: Before the procedure and under the influence of no sedative(s), amnesic(s), or anxiolytics, the patient was informed of the treatment options, risks and possible complications. To fulfill our ethical and legal obligations, as recommended by the American Medical Association's Code of Ethics, I have informed the patient of my clinical impression; the nature and purpose of the treatment or procedure; the risks, benefits, and possible complications of the intervention; the alternatives, including doing nothing; the risk(s) and benefit(s) of the alternative treatment(s) or procedure(s); and the risk(s) and benefit(s) of doing nothing. The patient was provided information about the general risks and possible complications associated with the procedure. These may include, but are not limited to: failure to achieve desired goals, infection, bleeding, organ or nerve damage, allergic reactions, paralysis, and death. In addition, the patient was informed of those risks and complications associated to Spine-related procedures, such as failure to decrease pain; infection (i.e.: Meningitis, epidural or intraspinal abscess); bleeding (i.e.: epidural hematoma, subarachnoid hemorrhage, or any other type of intraspinal or peri-dural bleeding); organ or nerve damage (i.e.: Any type of peripheral nerve, nerve root, or spinal cord injury) with subsequent damage to sensory, motor, and/or autonomic systems, resulting in permanent pain, numbness, and/or weakness of one or several areas of the body; allergic reactions; (i.e.: anaphylactic reaction); and/or death. Furthermore, the patient was informed of those risks and complications associated with the medications.  These include, but are not limited to: allergic reactions (i.e.: anaphylactic or anaphylactoid reaction(s)); adrenal axis suppression; blood sugar elevation that in diabetics may result in ketoacidosis or comma; water retention that in patients with history of congestive heart failure may result in shortness of breath, pulmonary edema, and decompensation with resultant heart failure; weight gain; swelling or edema; medication-induced neural toxicity; particulate matter embolism and blood vessel occlusion with resultant organ, and/or nervous system infarction; and/or aseptic necrosis of one or more joints. Finally, the patient was informed that Medicine is not an exact science; therefore, there is also the possibility of unforeseen or unpredictable risks and/or possible complications that may result in a catastrophic outcome. The patient indicated having understood very clearly. We have given the patient no guarantees and we have made no promises. Enough time was given to the patient to ask questions, all of which were answered to the patient's satisfaction. Kelly Rollins has indicated that she wanted to continue with the procedure. Attestation: I, the ordering provider, attest that I have discussed with the patient the benefits, risks, side-effects, alternatives, likelihood of achieving goals, and potential problems during recovery for the procedure that I have provided informed consent. Date   Time: 02/07/2019 10:30 AM  Pre-Procedure Preparation:  Monitoring: As per clinic protocol. Respiration, ETCO2, SpO2, BP, heart rate and rhythm monitor placed and checked for adequate function Safety Precautions: Patient was assessed for positional comfort and pressure points before starting the procedure. Time-out: I initiated and conducted the "Time-out" before starting the procedure, as per protocol. The patient was asked to participate by confirming the accuracy of the "Time Out" information. Verification of the  correct person, site, and procedure were performed and confirmed by me, the nursing staff, and the patient. "Time-out" conducted as per Joint Commission's Universal Protocol (UP.01.01.01). Time: 1123  Description of Procedure:          Laterality: Left Levels:  L2, L3, L4, L5, & S1 Medial Branch Level(s), at the L3-4, L4-5, and the L5-S1 lumbar facet joints. Area Prepped: Lumbosacral Prepping solution: DuraPrep (Iodine Povacrylex [0.7% available iodine] and Isopropyl Alcohol,  74% w/w) Safety Precautions: Aspiration looking for blood return was conducted prior to all injections. At no point did we inject any substances, as a needle was being advanced. Before injecting, the patient was told to immediately notify me if she was experiencing any new onset of "ringing in the ears, or metallic taste in the mouth". No attempts were made at seeking any paresthesias. Safe injection practices and needle disposal techniques used. Medications properly checked for expiration dates. SDV (single dose vial) medications used. After the completion of the procedure, all disposable equipment used was discarded in the proper designated medical waste containers. Local Anesthesia: Protocol guidelines were followed. The patient was positioned over the fluoroscopy table. The area was prepped in the usual manner. The time-out was completed. The target area was identified using fluoroscopy. A 12-in long, straight, sterile hemostat was used with fluoroscopic guidance to locate the targets for each level blocked. Once located, the skin was marked with an approved surgical skin marker. Once all sites were marked, the skin (epidermis, dermis, and hypodermis), as well as deeper tissues (fat, connective tissue and muscle) were infiltrated with a small amount of a short-acting local anesthetic, loaded on a 10cc syringe with a 25G, 1.5-in  Needle. An appropriate amount of time was allowed for local anesthetics to take effect before proceeding  to the next step. Local Anesthetic: Lidocaine 2.0% The unused portion of the local anesthetic was discarded in the proper designated containers. Technical explanation of process:  Radiofrequency Ablation (RFA) L2 Medial Branch Nerve RFA: The target area for the L2 medial branch is at the junction of the postero-lateral aspect of the superior articular process and the superior, posterior, and medial edge of the transverse process of L3. Under fluoroscopic guidance, a Radiofrequency needle was inserted until contact was made with os over the superior postero-lateral aspect of the pedicular shadow (target area). Sensory and motor testing was conducted to properly adjust the position of the needle. Once satisfactory placement of the needle was achieved, the numbing solution was slowly injected after negative aspiration for blood. 2.0 mL of the nerve block solution was injected without difficulty or complication. After waiting for at least 3 minutes, the ablation was performed. Once completed, the needle was removed intact. L3 Medial Branch Nerve RFA: The target area for the L3 medial branch is at the junction of the postero-lateral aspect of the superior articular process and the superior, posterior, and medial edge of the transverse process of L4. Under fluoroscopic guidance, a Radiofrequency needle was inserted until contact was made with os over the superior postero-lateral aspect of the pedicular shadow (target area). Sensory and motor testing was conducted to properly adjust the position of the needle. Once satisfactory placement of the needle was achieved, the numbing solution was slowly injected after negative aspiration for blood. 2.0 mL of the nerve block solution was injected without difficulty or complication. After waiting for at least 3 minutes, the ablation was performed. Once completed, the needle was removed intact. L4 Medial Branch Nerve RFA: The target area for the L4 medial branch is at the  junction of the postero-lateral aspect of the superior articular process and the superior, posterior, and medial edge of the transverse process of L5. Under fluoroscopic guidance, a Radiofrequency needle was inserted until contact was made with os over the superior postero-lateral aspect of the pedicular shadow (target area). Sensory and motor testing was conducted to properly adjust the position of the needle. Once satisfactory placement of the needle was achieved,  the numbing solution was slowly injected after negative aspiration for blood. 2.0 mL of the nerve block solution was injected without difficulty or complication. After waiting for at least 3 minutes, the ablation was performed. Once completed, the needle was removed intact. L5 Medial Branch Nerve RFA: The target area for the L5 medial branch is at the junction of the postero-lateral aspect of the superior articular process of S1 and the superior, posterior, and medial edge of the sacral ala. Under fluoroscopic guidance, a Radiofrequency needle was inserted until contact was made with os over the superior postero-lateral aspect of the pedicular shadow (target area). Sensory and motor testing was conducted to properly adjust the position of the needle. Once satisfactory placement of the needle was achieved, the numbing solution was slowly injected after negative aspiration for blood. 2.0 mL of the nerve block solution was injected without difficulty or complication. After waiting for at least 3 minutes, the ablation was performed. Once completed, the needle was removed intact. S1 Medial Branch Nerve RFA: The target area for the S1 medial branch is located inferior to the junction of the S1 superior articular process and the L5 inferior articular process, posterior, inferior, and lateral to the 6 o'clock position of the L5-S1 facet joint, just superior to the S1 posterior foramen. Under fluoroscopic guidance, the Radiofrequency needle was advanced until  contact was made with os over the Target area. Sensory and motor testing was conducted to properly adjust the position of the needle. Once satisfactory placement of the needle was achieved, the numbing solution was slowly injected after negative aspiration for blood. 2.0 mL of the nerve block solution was injected without difficulty or complication. After waiting for at least 3 minutes, the ablation was performed. Once completed, the needle was removed intact. Radiofrequency lesioning (ablation):  Radiofrequency Generator: NeuroTherm [id number] Sensory Stimulation Parameters: 50 Hz was used to locate & identify the nerve, making sure that the needle was positioned such that there was no sensory stimulation below 0.3 V or above 0.7 V. Motor Stimulation Parameters: 2 Hz was used to evaluate the motor component. Care was taken not to lesion any nerves that demonstrated motor stimulation of the lower extremities at an output of less than 2.5 times that of the sensory threshold, or a maximum of 2.0 V. Lesioning Technique Parameters: Standard Radiofrequency settings. (Not bipolar or pulsed.) Temperature Settings: 80 degrees C Lesioning time: 60 seconds Intra-operative Compliance: Compliant Materials & Medications: Needle(s) (Electrode/Cannula) Type: Teflon-coated, curved tip, Radiofrequency needle(s) Gauge: 22G Length: 10cm Numbing solution: 0.2% PF-Ropivacaine + Triamcinolone (40 mg/mL) diluted to a final concentration of 4 mg of Triamcinolone/mL of Ropivacaine The unused portion of the solution was discarded in the proper designated containers.  Once the entire procedure was completed, the treated area was cleaned, making sure to leave some of the prepping solution back to take advantage of its long term bactericidal properties.  Illustration of the posterior view of the lumbar spine and the posterior neural structures. Laminae of L2 through S1 are labeled. DPRL5, dorsal primary ramus of L5; DPRS1, dorsal  primary ramus of S1; DPR3, dorsal primary ramus of L3; FJ, facet (zygapophyseal) joint L3-L4; I, inferior articular process of L4; LB1, lateral branch of dorsal primary ramus of L1; IAB, inferior articular branches from L3 medial branch (supplies L4-L5 facet joint); IBP, intermediate branch plexus; MB3, medial branch of dorsal primary ramus of L3; NR3, third lumbar nerve root; S, superior articular process of L5; SAB, superior articular branches from L4 (supplies L4-5  facet joint also); TP3, transverse process of L3.  Vitals:   02/07/19 1156 02/07/19 1206 02/07/19 1216 02/07/19 1226  BP: (!) 138/92 (!) 148/76 (!) 141/78 135/85  Pulse:      Resp: 17 20 16 17   Temp:  97.8 F (36.6 C)  (!) 97.4 F (36.3 C)  TempSrc:  Temporal  Temporal  SpO2: 94% 100% 100% 100%  Weight:      Height:        Start Time: 1123 hrs. End Time: 1156 hrs.  Imaging Guidance (Spinal):          Type of Imaging Technique: Fluoroscopy Guidance (Spinal) Indication(s): Assistance in needle guidance and placement for procedures requiring needle placement in or near specific anatomical locations not easily accessible without such assistance. Exposure Time: Please see nurses notes. Contrast: None used. Fluoroscopic Guidance: I was personally present during the use of fluoroscopy. "Tunnel Vision Technique" used to obtain the best possible view of the target area. Parallax error corrected before commencing the procedure. "Direction-depth-direction" technique used to introduce the needle under continuous pulsed fluoroscopy. Once target was reached, antero-posterior, oblique, and lateral fluoroscopic projection used confirm needle placement in all planes. Images permanently stored in EMR. Interpretation: No contrast injected. I personally interpreted the imaging intraoperatively. Adequate needle placement confirmed in multiple planes. Permanent images saved into the patient's record.  Antibiotic Prophylaxis:   Anti-infectives  (From admission, onward)   None     Indication(s): None identified  Post-operative Assessment:  Post-procedure Vital Signs:  Pulse/HCG Rate: 9683 Temp: (!) 97.4 F (36.3 C) Resp: 17 BP: 135/85 SpO2: 100 %  EBL: None  Complications: No immediate post-treatment complications observed by team, or reported by patient.  Note: The patient tolerated the entire procedure well. A repeat set of vitals were taken after the procedure and the patient was kept under observation following institutional policy, for this type of procedure. Post-procedural neurological assessment was performed, showing return to baseline, prior to discharge. The patient was provided with post-procedure discharge instructions, including a section on how to identify potential problems. Should any problems arise concerning this procedure, the patient was given instructions to immediately contact us, at any time, without hesitation. In any case, we plan to contact the patient by telephone for a follow-up status report regarding this interventional procedure.  Comments:  No additional relevant information.  Plan of Care  Orders:  Orders Placed This Encounter  Procedures   RFA - Lumbar Facet (Today)    Scheduling Instructions:     Side(s): Left-sided     Level: L3-4, L4-5, & L5-S1 Facets (L2, L3, L4, L5, & S1 Medial Branch Nerves)     Sedation: With Sedation     Timeframe: Today    Order Specific Question:   Where will this procedure be performed?    Answer:   [facility]   RFA - Lumbar Facet (Schedule)    Standing Status:   Future    Standing Expiration Date:   08/06/2020    Scheduling Instructions:     Side(s): Right-sided     Level: L3-4, L4-5, & L5-S1 Facets (L2, L3, L4, L5, & S1 Medial Branch Nerves)     Sedation: With Sedation     Scheduling Timeframe: 2 weeks from now    Order Specific Question:   Where will this procedure be performed?    Answer:   [facility]   Fluoro (C-Arm) (<60  min) (No Report)    Intraoperative interpretation by procedural physician at  [facility].    Standing Status:   Standing    Number of Occurrences:   1    Order Specific Question:   Reason for exam:    Answer:   Assistance in needle guidance and placement for procedures requiring needle placement in or near specific anatomical locations not easily accessible without such assistance.   Consent: L-FCT (RFA)    Nursing Order: Transcribe to consent form and obtain patient signature. Note: Always confirm laterality of pain with Kelly Rollins, before procedure. Procedure: Lumbar Facet Radiofrequency Ablation Indication/Reason: Low Back Pain, with our without leg pain, due to Facet Joint Arthralgia (Joint Pain) known as Lumbar Facet Syndrome, secondary to Lumbar, and/or Lumbosacral Spondylosis (Arthritis of the Spine), without myelopathy or radiculopathy (Nerve Damage). Provider Attestation: I, Kenova Dossie Arbour, MD, (Pain Management Specialist), the physician/practitioner, attest that I have discussed with the patient the benefits, risks, side effects, alternatives, likelihood of achieving goals and potential problems during recovery for the procedure that I have provided informed consent.   Radiofrequency Tray    Equipment required: Sterile "Radiofrequency Tray"; Large hemostat (1); Small hemostat (1); Towels (6-8); 4x4 sterile sponge pack (1) Radiofrequency Needle(s): Size: Regular Quantity: 5    Standing Status:   Standing    Number of Occurrences:   1    Order Specific Question:   Specify    Answer:   Radiofrequency Tray   Chronic Opioid Analgesic:  No opioid analgesic prescriptions from our practice.  Hydrocodone/APAP 5/325, 1 tab PO q 8 hrs (15 mg/day of hydrocodone) (written by Lavonia Drafts. Whitaker, PA-C) #90 (on 10/31/2018) MME/day:15mg /day.   Medications ordered for procedure: Meds ordered this encounter  Medications   lidocaine (XYLOCAINE) 2 % (with pres) injection 400 mg     lactated ringers infusion 1,000 mL   midazolam (VERSED) 5 MG/5ML injection 1-2 mg    Make sure Flumazenil is available in the pyxis when using this medication. If oversedation occurs, administer 0.2 mg IV over 15 sec. If after 45 sec no response, administer 0.2 mg again over 1 min; may repeat at 1 min intervals; not to exceed 4 doses (1 mg)   fentaNYL (SUBLIMAZE) injection 25-50 mcg    Make sure Narcan is available in the pyxis when using this medication. In the event of respiratory depression (RR< 8/min): Titrate NARCAN (naloxone) in increments of 0.1 to 0.2 mg IV at 2-3 minute intervals, until desired degree of reversal.   ropivacaine (PF) 2 mg/mL (0.2%) (NAROPIN) injection 9 mL   triamcinolone acetonide (KENALOG-40) injection 40 mg   oxyCODONE-acetaminophen (PERCOCET) 5-325 MG tablet    Sig: Take 1 tablet by mouth every 6 (six) hours as needed for up to 7 days for severe pain. Must last 7 days.    Dispense:  28 tablet    Refill:  0    For acute post-operative pain. Not to be refilled. Most last 7 days.   oxyCODONE-acetaminophen (PERCOCET) 5-325 MG tablet    Sig: Take 1 tablet by mouth every 6 (six) hours as needed for up to 7 days for severe pain. Must last 7 days.    Dispense:  28 tablet    Refill:  0    For acute post-operative pain. Not to be refilled. Must last 7 days.   Medications administered: We administered lidocaine, lactated ringers, midazolam, fentaNYL, ropivacaine (PF) 2 mg/mL (0.2%), and triamcinolone acetonide.  See the medical record for exact dosing, route, and time of administration.  Follow-up plan:   Return in  about 2 weeks (around 02/21/2019) for RFA (w/ sedation): (R) L-FCT RFA #2.       Interventional treatment options: Planned, scheduled, and/or pending:   Palliative left-sided lumbar facet RFA #2 under fluoroscopic guidance and IV sedation   Under consideration:   Diagnostic bilateral IA hip joint injection  Diagnostic left IA hip joint injection     Therapeutic/palliative (PRN):   Palliative bilateral lumbar facet block #4  Palliative left L1 transverse process injection #2  Palliative/Therapeutic righ lumbar facet RFA #2 (last done 06/23/2016) Palliative/Therapeutic left lumbar facet RFA #2 (last done 06/08/2016)    Recent Visits Date Type Provider Dept  01/02/19 Telemedicine Milinda Pointer, MD [facility]  12/14/18 Telemedicine Milinda Pointer, [facility]  11/28/18 Office Visit Milinda Pointer, MD [facility]  11/17/18 Procedure visit Milinda Pointer, MD [facility]  11/16/18 Office Visit Milinda Pointer, MD [facility]  Showing recent visits within past 90 days and meeting all other requirements   Today's Visits Date Type Provider Dept  02/07/19 Procedure visit Milinda Pointer, MD [facility]  Showing today's visits and meeting all other requirements   Future Appointments Date Type Provider Dept  02/21/19 Appointment Milinda Pointer, MD [facility]  02/27/19 Appointment Milinda Pointer, MD [facility]  Showing future appointments within next 90 days and meeting all other requirements   Disposition: Discharge home  Discharge Date & Time: 02/07/2019; 1230 hrs.   Primary Care Physician: [name], PA-C Location: [facility] Note by: Gaspar Cola, MD Date: 02/07/2019; Time: 12:31 PM  Disclaimer:  Medicine is not an Chief Strategy Officer. The only guarantee in medicine is that nothing is guaranteed. It is important to note that the decision to proceed with this intervention was based on the information collected from the patient. The Data and conclusions were drawn from the patient's questionnaire, the interview, and the physical examination. Because the information was provided in large part by the patient, it cannot be guaranteed that it has not been purposely or unconsciously  manipulated. Every effort has been made to obtain as much relevant data as possible for this evaluation. It is important to note that the conclusions that lead to this procedure are derived in large part from the available data. Always take into account that the treatment will also be dependent on availability of resources and existing treatment guidelines, considered by other Pain Management Practitioners as being common knowledge and practice, at the time of the intervention. For Medico-Legal purposes, it is also important to point out that variation in procedural techniques and pharmacological choices are the acceptable norm. The indications, contraindications, technique, and results of the above procedure should only be interpreted and judged by a Board-Certified Interventional Pain Specialist with extensive familiarity and expertise in the same exact procedure and technique.

## 2019-02-07 NOTE — Patient Instructions (Addendum)
___________________________________________________________________________________________  Post-Radiofrequency (RF) Discharge Instructions  You have just completed a Radiofrequency Neurotomy.  The following instructions will provide you with information and guidelines for self-care upon discharge.  If at any time you have questions or concerns please call your physician. DO NOT DRIVE YOURSELF!!  Instructions:  Apply ice: Fill a plastic sandwich bag with crushed ice. Cover it with a small towel and apply to injection site. Apply for 15 minutes then remove x 15 minutes. Repeat sequence on day of procedure, until you go to bed. The purpose is to minimize swelling and discomfort after procedure.  Apply heat: Apply heat to procedure site starting the day following the procedure. The purpose is to treat any soreness and discomfort from the procedure.  Food intake: No eating limitations, unless stipulated above.  Nevertheless, if you have had sedation, you may experience some nausea.  In this case, it may be wise to wait at least two hours prior to resuming regular diet.  Physical activities: Keep activities to a minimum for the first 8 hours after the procedure. For the first 24 hours after the procedure, do not drive a motor vehicle,  Operate heavy machinery, power tools, or handle any weapons.  Consider walking with the use of an assistive device or accompanied by an adult for the first 24 hours.  Do not drink alcoholic beverages including beer.  Do not make any important decisions or sign any legal documents. Go home and rest today.  Resume activities tomorrow, as tolerated.  Use caution in moving about as you may experience mild leg weakness.  Use caution in cooking, use of household electrical appliances and climbing steps.  Driving: If you have received any sedation, you are not allowed to drive for 24 hours after your procedure.  Blood thinner: Restart your blood thinner 6 hours after your  procedure. (Only for those taking blood thinners)  Insulin: As soon as you can eat, you may resume your normal dosing schedule. (Only for those taking insulin)  Medications: May resume pre-procedure medications.  Do not take any drugs, other than what has been prescribed to you.  Infection prevention: Keep procedure site clean and dry.  Post-procedure Pain Diary: Extremely important that this be done correctly and accurately. Recorded information will be used to determine the next step in treatment.  Pain evaluated is that of treated area only. Do not include pain from an untreated area.  Complete every hour, on the hour, for the initial 8 hours. Set an alarm to help you do this part accurately.  Do not go to sleep and have it completed later. It will not be accurate.  Follow-up appointment: Keep your follow-up appointment after the procedure. Usually 2 weeks for most procedures. (6 weeks in the case of radiofrequency.) Bring you pain diary.   Expect:  From numbing medicine (AKA: Local Anesthetics): Numbness or decrease in pain.  Onset: Full effect within 15 minutes of injected.  Duration: It will depend on the type of local anesthetic used. On the average, 1 to 8 hours.   From steroids: Decrease in swelling or inflammation. Once inflammation is improved, relief of the pain will follow.  Onset of benefits: Depends on the amount of swelling present. The more swelling, the longer it will take for the benefits to be seen. In some cases, up to 10 days.  Duration: Steroids will stay in the system x 2 weeks. Duration of benefits will depend on multiple posibilities including persistent irritating factors.  From procedure: Some   discomfort is to be expected once the numbing medicine wears off. This should be minimal if ice and heat are applied as instructed.  Call if:  You experience numbness and weakness that gets worse with time, as opposed to wearing off.  He experience any unusual  bleeding, difficulty breathing, or loss of the ability to control your bowel and bladder. (This applies to Spinal procedures only)  You experience any redness, swelling, heat, red streaks, elevated temperature, fever, or any other signs of a possible infection.  Emergency Numbers:  [place] hours (Monday - Thursday, 8:00 AM - 4:00 PM) (Friday, 9:00 AM - 12:00 Noon): [phone]  After hours: [phone] ____________________________________________________________________________________________   ____________________________________________________________________________________________  Preparing for Procedure with Sedation  Procedure appointments are limited to planned procedures: . No Prescription Refills. . No disability issues will be discussed. . No medication changes will be discussed.  Instructions: . Oral Intake: Do not eat or drink anything for at least 8 hours prior to your procedure. . Transportation: Public transportation is not allowed. Bring an adult driver. The driver must be physically present in our waiting room before any procedure can be started. [place] Physical Assistance: Bring an adult physically capable of assisting you, in the event you need help. This adult should keep you company at home for at least 6 hours after the procedure. . Blood Pressure Medicine: Take your blood pressure medicine with a sip of water the morning of the procedure. . Blood thinners: Notify our staff if you are taking any blood thinners. Depending on which one you take, there will be specific instructions on how and when to stop it. . Diabetics on insulin: Notify the staff so that you can be scheduled 1st case in the morning. If your diabetes requires high dose insulin, take only  of your normal insulin dose the morning of the procedure and notify the staff that you have done so. . Preventing infections: Shower with an antibacterial soap the morning of your procedure. . Build-up  your immune system: Take 1000 mg of Vitamin C with every meal (3 times a day) the day prior to your procedure. [place] Kitchen Antibiotics: Inform the staff if you have a condition or reason that requires you to take antibiotics before dental procedures. . Pregnancy: If you are pregnant, call and cancel the procedure. . Sickness: If you have a cold, fever, or any active infections, call and cancel the procedure. . Arrival: You must be in the facility at least 30 minutes prior to your scheduled procedure. . Children: Do not bring children with you. . Dress appropriately: Bring dark clothing that you would not mind if they get stained. . Valuables: Do not bring any jewelry or valuables.  Reasons to call and reschedule or cancel your procedure: (Following these recommendations will minimize the risk of a serious complication.) . Surgeries: Avoid having procedures within 2 weeks of any surgery. (Avoid for 2 weeks before or after any surgery). . Flu Shots: Avoid having procedures within 2 weeks of a flu shots or . (Avoid for 2 weeks before or after immunizations). . Barium: Avoid having a procedure within 7-10 days after having had a radiological study involving the use of radiological contrast. (Myelograms, Barium swallow or enema study). . Heart attacks: Avoid any elective procedures or surgeries for the initial 6 months after a "Myocardial Infarction" (Heart Attack). . Blood thinners: It is imperative that you stop these medications before procedures. Let us know if you if you take any blood thinner.  Marland Kitchen  Infection: Avoid procedures during or within two weeks of an infection (including chest colds or gastrointestinal problems). Symptoms associated with infections include: Localized redness, fever, chills, night sweats or profuse sweating, burning sensation when voiding, cough, congestion, stuffiness, runny nose, sore throat, diarrhea, nausea, vomiting, cold or Flu symptoms, recent or current infections. It is specially  important if the infection is over the area that we intend to treat. [place] Kitchen Heart and lung problems: Symptoms that may suggest an active cardiopulmonary problem include: cough, chest pain, breathing difficulties or shortness of breath, dizziness, ankle swelling, uncontrolled high or unusually low blood pressure, and/or palpitations. If you are experiencing any of these symptoms, cancel your procedure and contact your primary care physician for an evaluation.  Remember:  Regular Business hours are:  Monday to Thursday 8:00 AM to 4:00 PM  Provider's Schedule: Milinda Pointer, MD:  Procedure days: Tuesday and Thursday 7:30 AM to 4:00 PM  Gillis Santa, MD:  Procedure days: Monday and Wednesday 7:30 AM to 4:00 PM ____________________________________________________________________________________________

## 2019-02-08 ENCOUNTER — Telehealth: Payer: Self-pay | Admitting: *Deleted

## 2019-02-08 NOTE — Telephone Encounter (Signed)
Called to check on her post procedure. No answer. LVM.

## 2019-02-21 ENCOUNTER — Ambulatory Visit
Admission: RE | Admit: 2019-02-21 | Discharge: 2019-02-21 | Disposition: A | Discharge status: Home / Self Care | Payer: Medicare Other | Source: Ambulatory Visit | Attending: Pain Medicine | Admitting: Pain Medicine

## 2019-02-21 ENCOUNTER — Other Ambulatory Visit: Payer: Self-pay

## 2019-02-21 ENCOUNTER — Ambulatory Visit (HOSPITAL_BASED_OUTPATIENT_CLINIC_OR_DEPARTMENT_OTHER): Payer: Medicare Other | Admitting: Pain Medicine

## 2019-02-21 ENCOUNTER — Encounter: Payer: Self-pay | Admitting: Pain Medicine

## 2019-02-21 VITALS — BP 132/81 | HR 113 | Temp 97.2°F | Resp 21 | Ht 63.0 in | Wt 189.0 lb

## 2019-02-21 DIAGNOSIS — M47816 Spondylosis without myelopathy or radiculopathy, lumbar region: Secondary | ICD-10-CM | POA: Diagnosis not present

## 2019-02-21 DIAGNOSIS — G8929 Other chronic pain: Secondary | ICD-10-CM | POA: Insufficient documentation

## 2019-02-21 DIAGNOSIS — M545 Low back pain, unspecified: Secondary | ICD-10-CM

## 2019-02-21 DIAGNOSIS — M5137 Other intervertebral disc degeneration, lumbosacral region: Secondary | ICD-10-CM | POA: Diagnosis not present

## 2019-02-21 DIAGNOSIS — G8918 Other acute postprocedural pain: Secondary | ICD-10-CM | POA: Diagnosis present

## 2019-02-21 DIAGNOSIS — Z79899 Other long term (current) drug therapy: Secondary | ICD-10-CM | POA: Insufficient documentation

## 2019-02-21 DIAGNOSIS — M47817 Spondylosis without myelopathy or radiculopathy, lumbosacral region: Secondary | ICD-10-CM | POA: Diagnosis not present

## 2019-02-21 LAB — GLUCOSE, CAPILLARY: Glucose-Capillary: 304 mg/dL — ABNORMAL HIGH (ref 70–99)

## 2019-02-21 MED ORDER — FENTANYL CITRATE (PF) 100 MCG/2ML IJ SOLN
INTRAMUSCULAR | Status: AC
  Filled 2019-02-21: qty 2

## 2019-02-21 MED ORDER — TRIAMCINOLONE ACETONIDE 40 MG/ML IJ SUSP
40.0000 mg | Freq: Once | INTRAMUSCULAR | Status: AC
  Administered 2019-02-21: 11:00:00 40 mg

## 2019-02-21 MED ORDER — OXYCODONE-ACETAMINOPHEN 5-325 MG PO TABS: 1.0000 | ORAL_TABLET | Freq: Four times a day (QID) | ORAL | 0 refills | Status: DC | PRN

## 2019-02-21 MED ORDER — ROPIVACAINE HCL 2 MG/ML IJ SOLN
INTRAMUSCULAR | Status: AC
  Filled 2019-02-21: qty 10

## 2019-02-21 MED ORDER — LIDOCAINE HCL 2 % IJ SOLN
20.0000 mL | Freq: Once | INTRAMUSCULAR | Status: AC
  Administered 2019-02-21: 400 mg

## 2019-02-21 MED ORDER — ROPIVACAINE HCL 2 MG/ML IJ SOLN
9.0000 mL | Freq: Once | INTRAMUSCULAR | Status: AC
  Administered 2019-02-21: 9 mL via PERINEURAL

## 2019-02-21 MED ORDER — MIDAZOLAM HCL 5 MG/5ML IJ SOLN
INTRAMUSCULAR | Status: AC
  Filled 2019-02-21: qty 5

## 2019-02-21 MED ORDER — LACTATED RINGERS IV SOLN
1000.0000 mL | Freq: Once | INTRAVENOUS | Status: AC
  Administered 2019-02-21: 1000 mL via INTRAVENOUS

## 2019-02-21 MED ORDER — LIDOCAINE HCL 2 % IJ SOLN
INTRAMUSCULAR | Status: AC
  Filled 2019-02-21: qty 20

## 2019-02-21 MED ORDER — MIDAZOLAM HCL 5 MG/5ML IJ SOLN
1.0000 mg | INTRAMUSCULAR | Status: DC | PRN
  Administered 2019-02-21: 1 mg via INTRAVENOUS

## 2019-02-21 MED ORDER — TRIAMCINOLONE ACETONIDE 40 MG/ML IJ SUSP
INTRAMUSCULAR | Status: AC
  Filled 2019-02-21: qty 1

## 2019-02-21 MED ORDER — FENTANYL CITRATE (PF) 100 MCG/2ML IJ SOLN
25.0000 ug | INTRAMUSCULAR | Status: DC | PRN
  Administered 2019-02-21: 50 ug via INTRAVENOUS

## 2019-02-21 MED ORDER — ONDANSETRON HCL 4 MG/2ML IJ SOLN
INTRAMUSCULAR | Status: AC
  Filled 2019-02-21: qty 2

## 2019-02-21 NOTE — Progress Notes (Signed)
PROVIDER NOTE: Information contained herein reflects review and annotations entered in association with encounter. Interpretation of such information and data should be left to medically-trained personnel. Information provided to patient can be located elsewhere in the medical record under "Patient Instructions". Document created using STT-dictation technology, any transcriptional errors that may result from process are unintentional.    Patient: Kelly Rollins  Service Category: Procedure  Provider: Gaspar Cola, MD  DOB: 12-21-1970  DOS: 02/21/2019  Location: [facility]  MRN: [id number]  Setting: Ambulatory - outpatient  Referring Provider: Milinda Pointer, MD  Type: Established Patient  Specialty: Interventional Pain Management  PCP: [name], PA-C   Primary Reason for Visit: Interventional Pain Management Treatment. CC: Back Pain (right, lower)  Procedure:          Anesthesia, Analgesia, Anxiolysis:  Type: Thermal Lumbar Facet, Medial Branch Radiofrequency Ablation/Neurotomy  #2  Primary Purpose: Therapeutic Region: Posterolateral Lumbosacral Spine Level: L2, L3, L4, L5, & S1 Medial Branch Level(s). These levels will denervate the L3-4, L4-5, and the L5-S1 lumbar facet joints. Laterality: Right  Type: Moderate (Conscious) Sedation combined with Local Anesthesia Indication(s): Analgesia and Anxiety Route: Intravenous (IV) IV Access: Secured Sedation: Meaningful verbal contact was maintained at all times during the procedure  Local Anesthetic: Lidocaine 1-2%  Position: Prone   Indications: 1. Lumbar facet syndrome (Bilateral) (L>R)   2. Spondylosis without myelopathy or radiculopathy, lumbosacral region   3. Lumbar facet arthropathy (Multilevel) (Bilateral)   4. DDD (degenerative disc disease), lumbosacral   5. Chronic low back pain (Primary Source of Pain) (Bilateral) (L>R)   6. Acute postoperative pain    Kelly Rollins has been dealing with  the above chronic pain for longer than three months and has either failed to respond, was unable to tolerate, or simply did not get enough benefit from other more conservative therapies including, but not limited to: 1. Over-the-counter medications 2. Anti-inflammatory medications 3. Muscle relaxants 4. Membrane stabilizers 5. Opioids 6. Physical therapy and/or chiropractic manipulation 7. Modalities (Heat, ice, etc.) 8. Invasive techniques such as nerve blocks. Kelly Rollins has attained more than 50% relief of the pain from a series of diagnostic injections conducted in separate occasions.  Pain Score: Pre-procedure: 5 /10 Post-procedure: 0-No pain/10   Post-Procedure Evaluation  Procedure (02/07/2019): Therapeutic/palliative left-sided lumbar facet RFA #2 under fluoroscopic guidance and IV sedation Pre-procedure pain level:  4/10 Post-procedure: 4/10 No relief  Sedation: Sedation provided.  Landis Martins, RN  02/21/2019 10:16 AM  Sign when Signing Visit  Pain relief after procedure (treated area only): (Questions asked to patient) 1. Starting about 15 minutes after the procedure, and "while the area was still numb" (from the local anesthetics), were you having any of your usual pain "in that area" (the treated area)?  (NOTE: NOT including the discomfort from the needle sticks.) First 1 hour: 100 % better. First 4-6 hours: 100 % better. 2. How long did the numbness from the local anesthetics last? (More than 6 hours?) Duration: 6 hours.  3. How much better is your pain now, when compared to before the procedure? Current benefit: 50 % better. 4. Can you move better now? Improvement in ROM (Range of Motion): Yes. 5. Can you do more now? Improvement in function: Yes. 4. Did you have any problems with the procedure? Side-effects/Complications: No.  Current benefits: Defined as benefit that persist at this time.   Analgesia:  50% improved.  Today the patient comes in to have the  right side done.  Hopefully this will provide her with benefit on the remaining 50%. Function: Kelly Rollins reports improvement in function ROM: Kelly Rollins reports improvement in ROM  Pre-op Assessment:  Kelly Rollins is a 49 y.o. (year old), female patient, seen today for interventional treatment. She  has a past surgical history that includes Cesarean section; Vaginal hysterectomy; Cholecystectomy; Breast reduction surgery; Breast excisional biopsy (Left, 1997); and Reduction mammaplasty (Bilateral, 1997). Kelly Rollins has a current medication list which includes the following prescription(s): amlodipine, artificial tears, methylprednisolone, metoprolol tartrate, novolog mix 70/30, omeprazole, pregabalin, rosuvastatin, tizanidine, venlafaxine xr, oxycodone-acetaminophen, and [START ON 02/28/2019] oxycodone-acetaminophen, and the following Facility-Administered Medications: fentanyl and midazolam. Her primarily concern today is the Back Pain (right, lower)  Initial Vital Signs:  Pulse/HCG Rate: (!) 113ECG Heart Rate: (!) 112 Temp: 97.9 F (36.6 C) Resp: 16 BP: 136/81 SpO2: 99 %  BMI: Estimated body mass index is 33.48 kg/m as calculated from the following:   Height as of this encounter: 5\' 3"  (1.6 m).   Weight as of this encounter: 189 lb (85.7 kg).  Risk Assessment: Allergies: Reviewed. She is allergic to atorvastatin; levofloxacin; morphine; penicillins; and tramadol hcl.  Allergy Precautions: None required Coagulopathies: Reviewed. None identified.  Blood-thinner therapy: None at this time Active Infection(s): Reviewed. None identified. Kelly Rollins is afebrile  Site Confirmation: Kelly Rollins was asked to confirm the procedure and laterality before marking the site Procedure checklist: Completed Consent: Before the procedure and under the influence of no sedative(s), amnesic(s), or anxiolytics, the patient was informed of the treatment options, risks and possible complications. To fulfill  our ethical and legal obligations, as recommended by the American Medical Association's Code of Ethics, I have informed the patient of my clinical impression; the nature and purpose of the treatment or procedure; the risks, benefits, and possible complications of the intervention; the alternatives, including doing nothing; the risk(s) and benefit(s) of the alternative treatment(s) or procedure(s); and the risk(s) and benefit(s) of doing nothing. The patient was provided information about the general risks and possible complications associated with the procedure. These may include, but are not limited to: failure to achieve desired goals, infection, bleeding, organ or nerve damage, allergic reactions, paralysis, and death. In addition, the patient was informed of those risks and complications associated to Spine-related procedures, such as failure to decrease pain; infection (i.e.: Meningitis, epidural or intraspinal abscess); bleeding (i.e.: epidural hematoma, subarachnoid hemorrhage, or any other type of intraspinal or peri-dural bleeding); organ or nerve damage (i.e.: Any type of peripheral nerve, nerve root, or spinal cord injury) with subsequent damage to sensory, motor, and/or autonomic systems, resulting in permanent pain, numbness, and/or weakness of one or several areas of the body; allergic reactions; (i.e.: anaphylactic reaction); and/or death. Furthermore, the patient was informed of those risks and complications associated with the medications. These include, but are not limited to: allergic reactions (i.e.: anaphylactic or anaphylactoid reaction(s)); adrenal axis suppression; blood sugar elevation that in diabetics may result in ketoacidosis or comma; water retention that in patients with history of congestive heart failure may result in shortness of breath, pulmonary edema, and decompensation with resultant heart failure; weight gain; swelling or edema; medication-induced neural toxicity;  particulate matter embolism and blood vessel occlusion with resultant organ, and/or nervous system infarction; and/or aseptic necrosis of one or more joints. Finally, the patient was informed that Medicine is not an exact science; therefore, there is also the possibility of unforeseen or unpredictable risks and/or possible complications that may result  in a catastrophic outcome. The patient indicated having understood very clearly. We have given the patient no guarantees and we have made no promises. Enough time was given to the patient to ask questions, all of which were answered to the patient's satisfaction. Ms. Caldron has indicated that she wanted to continue with the procedure. Attestation: I, the ordering provider, attest that I have discussed with the patient the benefits, risks, side-effects, alternatives, likelihood of achieving goals, and potential problems during recovery for the procedure that I have provided informed consent. Date  Time: 02/21/2019 10:12 AM  Pre-Procedure Preparation:  Monitoring: As per clinic protocol. Respiration, ETCO2, SpO2, BP, heart rate and rhythm monitor placed and checked for adequate function Safety Precautions: Patient was assessed for positional comfort and pressure points before starting the procedure. Time-out: I initiated and conducted the "Time-out" before starting the procedure, as per protocol. The patient was asked to participate by confirming the accuracy of the "Time Out" information. Verification of the correct person, site, and procedure were performed and confirmed by me, the nursing staff, and the patient. "Time-out" conducted as per Joint Commission's Universal Protocol (UP.01.01.01). Time: 1106  Description of Procedure:          Laterality: Right Levels:  L2, L3, L4, L5, & S1 Medial Branch Level(s), at the L3-4, L4-5, and the L5-S1 lumbar facet joints. Area Prepped: Lumbosacral Prepping solution: DuraPrep (Iodine Povacrylex [0.7% available  iodine] and Isopropyl Alcohol, 74% w/w) Safety Precautions: Aspiration looking for blood return was conducted prior to all injections. At no point did we inject any substances, as a needle was being advanced. Before injecting, the patient was told to immediately notify me if she was experiencing any new onset of "ringing in the ears, or metallic taste in the mouth". No attempts were made at seeking any paresthesias. Safe injection practices and needle disposal techniques used. Medications properly checked for expiration dates. SDV (single dose vial) medications used. After the completion of the procedure, all disposable equipment used was discarded in the proper designated medical waste containers. Local Anesthesia: Protocol guidelines were followed. The patient was positioned over the fluoroscopy table. The area was prepped in the usual manner. The time-out was completed. The target area was identified using fluoroscopy. A 12-in long, straight, sterile hemostat was used with fluoroscopic guidance to locate the targets for each level blocked. Once located, the skin was marked with an approved surgical skin marker. Once all sites were marked, the skin (epidermis, dermis, and hypodermis), as well as deeper tissues (fat, connective tissue and muscle) were infiltrated with a small amount of a short-acting local anesthetic, loaded on a 10cc syringe with a 25G, 1.5-in  Needle. An appropriate amount of time was allowed for local anesthetics to take effect before proceeding to the next step. Local Anesthetic: Lidocaine 2.0% The unused portion of the local anesthetic was discarded in the proper designated containers. Technical explanation of process:  Radiofrequency Ablation (RFA) L2 Medial Branch Nerve RFA: The target area for the L2 medial branch is at the junction of the postero-lateral aspect of the superior articular process and the superior, posterior, and medial edge of the transverse process of L3. Under  fluoroscopic guidance, a Radiofrequency needle was inserted until contact was made with os over the superior postero-lateral aspect of the pedicular shadow (target area). Sensory and motor testing was conducted to properly adjust the position of the needle. Once satisfactory placement of the needle was achieved, the numbing solution was slowly injected after negative aspiration for  blood. 2.0 mL of the nerve block solution was injected without difficulty or complication. After waiting for at least 3 minutes, the ablation was performed. Once completed, the needle was removed intact. L3 Medial Branch Nerve RFA: The target area for the L3 medial branch is at the junction of the postero-lateral aspect of the superior articular process and the superior, posterior, and medial edge of the transverse process of L4. Under fluoroscopic guidance, a Radiofrequency needle was inserted until contact was made with os over the superior postero-lateral aspect of the pedicular shadow (target area). Sensory and motor testing was conducted to properly adjust the position of the needle. Once satisfactory placement of the needle was achieved, the numbing solution was slowly injected after negative aspiration for blood. 2.0 mL of the nerve block solution was injected without difficulty or complication. After waiting for at least 3 minutes, the ablation was performed. Once completed, the needle was removed intact. L4 Medial Branch Nerve RFA: The target area for the L4 medial branch is at the junction of the postero-lateral aspect of the superior articular process and the superior, posterior, and medial edge of the transverse process of L5. Under fluoroscopic guidance, a Radiofrequency needle was inserted until contact was made with os over the superior postero-lateral aspect of the pedicular shadow (target area). Sensory and motor testing was conducted to properly adjust the position of the needle. Once satisfactory placement of the  needle was achieved, the numbing solution was slowly injected after negative aspiration for blood. 2.0 mL of the nerve block solution was injected without difficulty or complication. After waiting for at least 3 minutes, the ablation was performed. Once completed, the needle was removed intact. L5 Medial Branch Nerve RFA: The target area for the L5 medial branch is at the junction of the postero-lateral aspect of the superior articular process of S1 and the superior, posterior, and medial edge of the sacral ala. Under fluoroscopic guidance, a Radiofrequency needle was inserted until contact was made with os over the superior postero-lateral aspect of the pedicular shadow (target area). Sensory and motor testing was conducted to properly adjust the position of the needle. Once satisfactory placement of the needle was achieved, the numbing solution was slowly injected after negative aspiration for blood. 2.0 mL of the nerve block solution was injected without difficulty or complication. After waiting for at least 3 minutes, the ablation was performed. Once completed, the needle was removed intact. S1 Medial Branch Nerve RFA: The target area for the S1 medial branch is located inferior to the junction of the S1 superior articular process and the L5 inferior articular process, posterior, inferior, and lateral to the 6 o'clock position of the L5-S1 facet joint, just superior to the S1 posterior foramen. Under fluoroscopic guidance, the Radiofrequency needle was advanced until contact was made with os over the Target area. Sensory and motor testing was conducted to properly adjust the position of the needle. Once satisfactory placement of the needle was achieved, the numbing solution was slowly injected after negative aspiration for blood. 2.0 mL of the nerve block solution was injected without difficulty or complication. After waiting for at least 3 minutes, the ablation was performed. Once completed, the needle was  removed intact. Radiofrequency lesioning (ablation):  Radiofrequency Generator: NeuroTherm [id number] Sensory Stimulation Parameters: 50 Hz was used to locate & identify the nerve, making sure that the needle was positioned such that there was no sensory stimulation below 0.3 V or above 0.7 V. Motor Stimulation Parameters: 2 Hz was  used to evaluate the motor component. Care was taken not to lesion any nerves that demonstrated motor stimulation of the lower extremities at an output of less than 2.5 times that of the sensory threshold, or a maximum of 2.0 V. Lesioning Technique Parameters: Standard Radiofrequency settings. (Not bipolar or pulsed.) Temperature Settings: 80 degrees C Lesioning time: 60 seconds Intra-operative Compliance: Compliant Materials & Medications: Needle(s) (Electrode/Cannula) Type: Teflon-coated, curved tip, Radiofrequency needle(s) Gauge: 20G Length: 15cm Numbing solution: 0.2% PF-Ropivacaine + Triamcinolone (40 mg/mL) diluted to a final concentration of 4 mg of Triamcinolone/mL of Ropivacaine The unused portion of the solution was discarded in the proper designated containers.  Once the entire procedure was completed, the treated area was cleaned, making sure to leave some of the prepping solution back to take advantage of its long term bactericidal properties.  Illustration of the posterior view of the lumbar spine and the posterior neural structures. Laminae of L2 through S1 are labeled. DPRL5, dorsal primary ramus of L5; DPRS1, dorsal primary ramus of S1; DPR3, dorsal primary ramus of L3; FJ, facet (zygapophyseal) joint L3-L4; I, inferior articular process of L4; LB1, lateral branch of dorsal primary ramus of L1; IAB, inferior articular branches from L3 medial branch (supplies L4-L5 facet joint); IBP, intermediate branch plexus; MB3, medial branch of dorsal primary ramus of L3; NR3, third lumbar nerve root; S, superior articular process of L5; SAB, superior articular branches  from L4 (supplies L4-5 facet joint also); TP3, transverse process of L3.  Vitals:   02/21/19 1138 02/21/19 1148 02/21/19 1158 02/21/19 1208  BP: (!) 142/90 124/70 106/72 132/81  Pulse:      Resp: 18 17 (!) 21 (!) 21  Temp:  (!) 97.5 F (36.4 C)  (!) 97.2 F (36.2 C)  TempSrc:      SpO2: 96% 100% 99% 100%  Weight:      Height:       Start Time: 1106 hrs. End Time: 1138 hrs.  Imaging Guidance (Spinal):          Type of Imaging Technique: Fluoroscopy Guidance (Spinal) Indication(s): Assistance in needle guidance and placement for procedures requiring needle placement in or near specific anatomical locations not easily accessible without such assistance. Exposure Time: Please see nurses notes. Contrast: None used. Fluoroscopic Guidance: I was personally present during the use of fluoroscopy. "Tunnel Vision Technique" used to obtain the best possible view of the target area. Parallax error corrected before commencing the procedure. "Direction-depth-direction" technique used to introduce the needle under continuous pulsed fluoroscopy. Once target was reached, antero-posterior, oblique, and lateral fluoroscopic projection used confirm needle placement in all planes. Images permanently stored in EMR. Interpretation: No contrast injected. I personally interpreted the imaging intraoperatively. Adequate needle placement confirmed in multiple planes. Permanent images saved into the patient's record.  Antibiotic Prophylaxis:   Anti-infectives (From admission, onward)   None     Indication(s): None identified  Post-operative Assessment:  Post-procedure Vital Signs:  Pulse/HCG Rate: (!) 11389 Temp: (!) 97.2 F (36.2 C) Resp: (!) 21 BP: 132/81 SpO2: 100 %  EBL: None  Complications: No immediate post-treatment complications observed by team, or reported by patient.  Note: The patient tolerated the entire procedure well. A repeat set of vitals were taken after the procedure and the  patient was kept under observation following institutional policy, for this type of procedure. Post-procedural neurological assessment was performed, showing return to baseline, prior to discharge. The patient was provided with post-procedure discharge instructions, including a section on how to  identify potential problems. Should any problems arise concerning this procedure, the patient was given instructions to immediately contact us, at any time, without hesitation. In any case, we plan to contact the patient by telephone for a follow-up status report regarding this interventional procedure.  Comments:  No additional relevant information.  Plan of Care  Orders:  Orders Placed This Encounter  Procedures  . Radiofrequency,Lumbar    Scheduling Instructions:     Side(s): Right-sided     Level: L3-4, L4-5, & L5-S1 Facets (L2, L3, L4, L5, & S1 Medial Branch Nerves)     Sedation: With Sedation     Timeframe: Today    Order Specific Question:   Where will this procedure be performed?    Answer:   [facility]  . DG [facility] C-ARM 1-60 MIN NO REPORT    Intraoperative interpretation by procedural physician at [place].    Standing Status:   Standing    Number of Occurrences:   1    Order Specific Question:   Reason for exam:    Answer:   Assistance in needle guidance and placement for procedures requiring needle placement in or near specific anatomical locations not easily accessible without such assistance.  . Glucose, capillary  . Informed Consent Details: Physician/Practitioner Attestation; Transcribe to consent form and obtain patient signature    Nursing Order: Transcribe to consent form and obtain patient signature. Note: Always confirm laterality of pain with Ms. Islam, before procedure. Procedure: Lumbar Facet Radiofrequency Ablation Indication/Reason: Low Back Pain, with our without leg pain, due to Facet Joint Arthralgia (Joint Pain) known as Lumbar Facet  Syndrome, secondary to Lumbar, and/or Lumbosacral Spondylosis (Arthritis of the Spine), without myelopathy or radiculopathy (Nerve Damage). Provider Attestation: I, [place] Dossie Arbour, MD, (Pain Management Specialist), the physician/practitioner, attest that I have discussed with the patient the benefits, risks, side effects, alternatives, likelihood of achieving goals and potential problems during recovery for the procedure that I have provided informed consent.  . Provide equipment / supplies at bedside    Equipment required: Sterile "Radiofrequency Tray"; Large hemostat (1); Small hemostat (1); Towels (6-8); 4x4 sterile sponge pack (1) Radiofrequency Needle(s): Size: Regular Quantity: 5    Standing Status:   Standing    Number of Occurrences:   1    Order Specific Question:   Specify    Answer:   Radiofrequency Tray   Chronic Opioid Analgesic:  No opioid analgesic prescriptions from our practice.  Hydrocodone/APAP 5/325, 1 tab PO q 8 hrs (15 mg/day of hydrocodone) (written by Lavonia Drafts. Whitaker, PA-C) #90 (on 10/31/2018) MME/day:15mg /day.   Medications ordered for procedure: Meds ordered this encounter  Medications  . lidocaine (XYLOCAINE) 2 % (with pres) injection 400 mg  . lactated ringers infusion 1,000 mL  . midazolam (VERSED) 5 MG/5ML injection 1-2 mg    Make sure Flumazenil is available in the pyxis when using this medication. If oversedation occurs, administer 0.2 mg IV over 15 sec. If after 45 sec no response, administer 0.2 mg again over 1 min; may repeat at 1 min intervals; not to exceed 4 doses (1 mg)  . fentaNYL (SUBLIMAZE) injection 25-50 mcg    Make sure Narcan is available in the pyxis when using this medication. In the event of respiratory depression (RR< 8/min): Titrate NARCAN (naloxone) in increments of 0.1 to 0.2 mg IV at 2-3 minute intervals, until desired degree of reversal.  . ropivacaine (PF) 2 mg/mL (0.2%) (NAROPIN) injection 9 mL  .  triamcinolone acetonide  (KENALOG-40) injection 40 mg  . DISCONTD: oxyCODONE-acetaminophen (PERCOCET) 5-325 MG tablet    Sig: Take 1 tablet by mouth every 6 (six) hours as needed for up to 7 days for severe pain. Must last 7 days.    Dispense:  28 tablet    Refill:  0    For acute post-operative pain. Not to be refilled. Most last 7 days.  Marland Kitchen DISCONTD: oxyCODONE-acetaminophen (PERCOCET) 5-325 MG tablet    Sig: Take 1 tablet by mouth every 6 (six) hours as needed for up to 7 days for severe pain. Must last 7 days.    Dispense:  28 tablet    Refill:  0    For acute post-operative pain. Not to be refilled. Must last 7 days.  Marland Kitchen DISCONTD: oxyCODONE-acetaminophen (PERCOCET) 5-325 MG tablet    Sig: Take 1 tablet by mouth every 6 (six) hours as needed for up to 7 days for severe pain. Must last 7 days.    Dispense:  28 tablet    Refill:  0    For acute post-operative pain. Not to be refilled. Most last 7 days.  Marland Kitchen DISCONTD: oxyCODONE-acetaminophen (PERCOCET) 5-325 MG tablet    Sig: Take 1 tablet by mouth every 6 (six) hours as needed for up to 7 days for severe pain. Must last 7 days.    Dispense:  28 tablet    Refill:  0    For acute post-operative pain. Not to be refilled. Must last 7 days.  Marland Kitchen oxyCODONE-acetaminophen (PERCOCET) 5-325 MG tablet    Sig: Take 1 tablet by mouth every 6 (six) hours as needed for up to 7 days for severe pain. Must last 7 days.    Dispense:  28 tablet    Refill:  0    For acute post-operative pain. Not to be refilled. Most last 7 days.  Marland Kitchen oxyCODONE-acetaminophen (PERCOCET) 5-325 MG tablet    Sig: Take 1 tablet by mouth every 6 (six) hours as needed for up to 7 days for severe pain. Must last 7 days.    Dispense:  28 tablet    Refill:  0    For acute post-operative pain. Not to be refilled. Must last 7 days.   Medications administered: We administered lidocaine, lactated ringers, midazolam, fentaNYL, ropivacaine (PF) 2 mg/mL (0.2%), and triamcinolone acetonide.  See the medical record for  exact dosing, route, and time of administration.  Follow-up plan:   Return in about 6 weeks (around 04/04/2019) for (VV), (PP).       Interventional treatment options: Planned, scheduled, and/or pending:   Palliative left-sided lumbar facet RFA #2 under fluoroscopic guidance and IV sedation   Under consideration:   Diagnostic bilateral IA hip joint injection  Diagnostic left IA hip joint injection    Therapeutic/palliative (PRN):   Palliative bilateral lumbar facet block #4  Palliative left L1 transverse process injection #2  Palliative/Therapeutic righ lumbar facet RFA #2 (last done 06/23/2016) Palliative/Therapeutic left lumbar facet RFA #2 (last done 02/07/2019)     Recent Visits Date Type Provider Dept  02/07/19 Procedure visit Milinda Pointer, MD [facility]  01/02/19 Telemedicine Milinda Pointer, MD [facility]  12/14/18 Telemedicine Milinda Pointer, MD [facility]  11/28/18 Office Visit Milinda Pointer, MD [facility]  Showing recent visits within past 90 days and meeting all other requirements   Today's Visits Date Type Provider Dept  02/21/19 Procedure visit Milinda Pointer, MD [facility]  Showing today's visits  and meeting all other requirements   Future Appointments Date Type Provider Dept  02/27/19 Appointment Milinda Pointer, [facility]  04/04/19 Appointment Milinda Pointer, MD [facility]  Showing future appointments within next 90 days and meeting all other requirements   Disposition: Discharge home  Discharge (Date  Time): 02/21/2019; 1210 hrs.   Primary Care Physician: [name], PA-C Location: [facility] Note by: Gaspar Cola, MD Date: 02/21/2019; Time: 12:12 PM  Disclaimer:  Medicine is not an Chief Strategy Officer. The only guarantee in medicine is that nothing is guaranteed. It is important to note that the decision  to proceed with this intervention was based on the information collected from the patient. The Data and conclusions were drawn from the patient's questionnaire, the interview, and the physical examination. Because the information was provided in large part by the patient, it cannot be guaranteed that it has not been purposely or unconsciously manipulated. Every effort has been made to obtain as much relevant data as possible for this evaluation. It is important to note that the conclusions that lead to this procedure are derived in large part from the available data. Always take into account that the treatment will also be dependent on availability of resources and existing treatment guidelines, considered by other Pain Management Practitioners as being common knowledge and practice, at the time of the intervention. For Medico-Legal purposes, it is also important to point out that variation in procedural techniques and pharmacological choices are the acceptable norm. The indications, contraindications, technique, and results of the above procedure should only be interpreted and judged by a Board-Certified Interventional Pain Specialist with extensive familiarity and expertise in the same exact procedure and technique.

## 2019-02-21 NOTE — Progress Notes (Signed)
Pain relief after procedure (treated area only): (Questions asked to patient) 1. Starting about 15 minutes after the procedure, and "while the area was still numb" (from the local anesthetics), were you having any of your usual pain "in that area" (the treated area)?  (NOTE: NOT including the discomfort from the needle sticks.) First 1 hour: 100 % better. First 4-6 hours: 100 % better. 2. How long did the numbness from the local anesthetics last? (More than 6 hours?) Duration: 6 hours.  3. How much better is your pain now, when compared to before the procedure? Current benefit: 50 % better. 4. Can you move better now? Improvement in ROM (Range of Motion): Yes. 5. Can you do more now? Improvement in function: Yes. 4. Did you have any problems with the procedure? Side-effects/Complications: No.

## 2019-02-21 NOTE — Patient Instructions (Signed)

## 2019-02-22 ENCOUNTER — Encounter: Payer: Self-pay | Admitting: Pain Medicine

## 2019-02-22 ENCOUNTER — Telehealth: Payer: Self-pay

## 2019-02-22 NOTE — Telephone Encounter (Signed)
Attempted to call patient.  Unable to leave message because the mailbox is full.

## 2019-02-22 NOTE — Telephone Encounter (Signed)
Post procedure phone call.  LM 

## 2019-02-22 NOTE — Progress Notes (Signed)
Pain relief after procedure (treated area only): (Questions asked to patient) 1. Starting about 15 minutes after the procedure, and "while the area was still numb" (from the local anesthetics), were you having any of your usual pain "in that area" (the treated area)?  (NOTE: NOT including the discomfort from the needle sticks.) First 1 hour:100 % better. First 4-6 hours: 100 % better. It has been 24 hours since procedure.  Unable to evaluate Long term relief

## 2019-02-23 NOTE — Progress Notes (Signed)
Patient: Kelly Rollins  Service Category: E/M  Provider: Gaspar Cola, MD  DOB: Feb 14, 1970  DOS: 02/27/2019  Location: Office  MRN: [id number]  Setting: Ambulatory outpatient  Referring Provider: [name],*  Type: Established Patient  Specialty: Interventional Pain Management  PCP: [name], PA-C  Location: Remote location  Delivery: TeleHealth     Virtual Encounter - Pain Management PROVIDER NOTE: Information contained herein reflects review and annotations entered in association with encounter. Interpretation of such information and data should be left to medically-trained personnel. Information provided to patient can be located elsewhere in the medical record under "Patient Instructions". Document created using STT-dictation technology, any transcriptional errors that may result from process are unintentional.    Contact & Pharmacy Preferred: [phone] Home: [phone] (home) Mobile: [phone] (mobile) E-mail: [email or web address], [place] [address] [place] [address] Phone: [phone] Fax: [phone]   Pre-screening  Ms. Bland Span offered "in-person" vs "virtual" encounter. She indicated preferring virtual for this encounter.   Reason COVID-19*  Social distancing based on CDC and AMA recommendations.   I contacted Courtney Heys on 02/27/2019 via telephone.      I clearly identified myself as Gaspar Cola, MD. I verified that I was speaking with the correct person using two identifiers (Name: Kelly Rollins, and date of birth: 11-28-1970).  Consent I sought verbal advanced consent from Courtney Heys for virtual visit interactions. I informed Ms. Hubbs of possible security and privacy concerns, risks, and limitations associated with providing "not-in-person" medical evaluation and management services. I also informed Ms. Adi of the availability of  "in-person" appointments. Finally, I informed her that there would be a charge for the virtual visit and that she could be  personally, fully or partially, financially responsible for it. Kelly Rollins expressed understanding and agreed to proceed.   Historic Elements   Kelly Rollins is a 49 y.o. year old, female patient evaluated today after her last encounter by our practice on 02/22/2019. [name]  has a past medical history of ABDOMINAL PAIN OTHER SPECIFIED SITE (02/21/2009), ABDOMINAL PAIN-LUQ (04/17/2009), Anal fissure, Asthma, Diabetes mellitus without complication ([place]), [id number]), HEMATOCHEZIA (04/17/2009), Hypertension, IBS (irritable bowel syndrome), Migraine headache (11/06/2008), OVARIAN CYST, RIGHT (02/21/2009), PALPITATIONS, OCCASIONAL (04/17/2010), and Pancreatitis (10/14/2017). She also  has a past surgical history that includes Cesarean section; Vaginal hysterectomy; Cholecystectomy; Breast reduction surgery; Breast excisional biopsy (Left, 1997); and Reduction mammaplasty (Bilateral, 1997). Kelly Rollins has a current medication list which includes the following prescription(s): amlodipine, artificial tears, methylprednisolone, metoprolol tartrate, novolog mix 70/30, omeprazole, [START ON 02/28/2019] oxycodone-acetaminophen, [START ON 03/07/2019] oxycodone-acetaminophen, [START ON 03/14/2019] oxycodone-acetaminophen, [START ON 04/19/2019] pregabalin, pregabalin, [START ON 03/29/2019] pregabalin, rosuvastatin, tizanidine, and venlafaxine xr. She  reports that she quit smoking about 4 years ago. Her smoking use included cigarettes. She has never used smokeless tobacco. She reports current alcohol use. She reports that she does not use drugs. Kelly Rollins is allergic to atorvastatin; levofloxacin; morphine; penicillins; and tramadol hcl.   HPI  Today, she is being contacted for both, medication management and a post-procedure assessment.  Today we will assess the patient's Lyrica titration as  well as her current status after her RFA.  Her left lumbar facet RFA #2 was done on 02/07/2019 (20 days ago) and the right side was completed on 02/21/2019 (6 days ago).  Typically the recovery period from a radiofrequency ablation is approximately 6 weeks.  This period of  time (6 weeks) will be completed on Tuesday, April 04, 2019.  According to the patient she is doing well with the Lyrica, she is taking it 3 times a day and she is not having any side effects or problems with the medication.  However, she also notices that its not really helping that much.  She indicates that today seems to be a bad day and she has not been able to do anything due to the back pain.  She has requested that we do another refill on her pain medicine.  We will go ahead and do that and we will call her back around March 2, when the 6 weeks are completed so that we can reevaluate and see how she has done after the 2 radiofrequencies.  I will also continue to titrate the Lyrica up as tolerated.  Post-Procedure Evaluation  Procedure (02/21/2019): Therapeutic right lumbar facet RFA #2 under fluoroscopic guidance and IV sedation Pre-procedure pain level:  5/10 Post-procedure: 0/10 (100% relief)  Sedation: Sedation provided.  [name], RN  02/22/2019  2:25 PM  Signed Pain relief after procedure (treated area only): (Questions asked to patient) 1. Starting about 15 minutes after the procedure, and "while the area was still numb" (from the local anesthetics), were you having any of your usual pain "in that area" (the treated area)?  (NOTE: NOT including the discomfort from the needle sticks.) First 1 hour:100 % better. First 4-6 hours: 100 % better. It has been 24 hours since procedure.  Unable to evaluate Long term relief  Current benefits: Defined as benefit that persist at this time.   Analgesia:  90-100% better Function: Kelly Rollins reports improvement in function ROM: Kelly Rollins reports improvement in  ROM  Pharmacotherapy Assessment  Analgesic: No opioid analgesic prescriptions from our practice.  Hydrocodone/APAP 5/325, 1 tab PO q 8 hrs (15 mg/day of hydrocodone) (written by Lavonia Drafts. Whitaker, PA-C) #90 (on 10/31/2018) MME/day:15mg /day.   Monitoring: Pharmacotherapy: No side-effects or adverse reactions reported. [place] PMP: PDMP reviewed during this encounter.       Compliance: No problems identified. Effectiveness: Clinically acceptable. Plan: Refer to "POC".  UDS:  Summary  Date Value Ref Range Status  01/18/2017 FINAL  Final    Comment:    ==================================================================== TOXASSURE SELECT 13 (MW) ==================================================================== Test                             Result       Flag       Units Drug Absent but Declared for Prescription Verification   Oxycodone                      Not Detected UNEXPECTED ng/mg creat ==================================================================== Test                      Result    Flag   Units      Ref Range   Creatinine              209              mg/dL      >=20 ==================================================================== Declared Medications:  The flagging and interpretation on this report are based on the  following declared medications.  Unexpected results may arise from  inaccuracies in the declared medications.  **Note: The testing scope of this panel includes these medications:  Oxycodone  **Note: The testing scope of  this panel does not include following  reported medications:  Acetaminophen  Albuterol  Aspirin  Caffeine  Gabapentin  Ibuprofen  Magnesium  Metformin  Metoprolol (Toprol)  Multivitamin (MVI)  Ondansetron (Zofran)  Venlafaxine (Effexor)  Vitamin C  Vitamin D3 ==================================================================== For clinical consultation, please call [phone]. ====================================================================    Laboratory Chemistry Profile (12 mo)  Renal: No results found for requested labs within last 8760 hours.  Lab Results  Component Value Date   GFR 88.95 03/19/2010   GFRAA >60 11/05/2017   GFRNONAA >60 11/05/2017   Hepatic: No results found for requested labs within last 8760 hours. Lab Results  Component Value Date   AST 48 (H) 11/05/2017   ALT 41 11/05/2017   Other: 11/16/2018: 25-Hydroxy, Vitamin D 23; 25-Hydroxy, Vitamin D-2 4.0; 25-Hydroxy, Vitamin D-3 19; CRP 2; Sed Rate 40; Vitamin B-12 644  Note: Above Lab results reviewed.  Imaging  DG PAIN CLINIC C-ARM 1-60 MIN NO REPORT Fluoro was used, but no Radiologist interpretation will be provided.  Please refer to "NOTES" tab for provider progress note.   Assessment  The primary encounter diagnosis was Chronic pain syndrome. Diagnoses of Chronic low back pain (Primary Source of Pain) (Bilateral) (L>R), Chronic hip pain (Secondary source of pain) (Bilateral) (L>R), Chronic knee pain (Tertiary source of pain) (Bilateral) (L>R), Neurogenic pain, and Acute postoperative pain were also pertinent to this visit.  Plan of Care  Problem-specific:  No problem-specific Assessment & Plan notes found for this encounter.  I have changed Clearnce Sorrel. Donigan's pregabalin. I am also having her start on oxyCODONE-acetaminophen, pregabalin, and pregabalin. Additionally, I am having her maintain her venlafaxine XR, amLODipine, omeprazole, methylPREDNISolone, Artificial Tears, NovoLOG Mix 70/30, rosuvastatin, metoprolol tartrate, tiZANidine, oxyCODONE-acetaminophen, and oxyCODONE-acetaminophen.  Pharmacotherapy (Medications Ordered): Meds ordered this encounter  Medications  . pregabalin (LYRICA) 50 MG capsule    Sig: Take 1 capsule (50 mg total) by mouth 2 (two) times daily for 15 days, THEN 1 capsule (50 mg total) 3 (three) times daily for 15 days.    Dispense:   75 capsule    Refill:  0    Fill one day early if pharmacy is closed on scheduled refill date. May substitute for generic if available.  Marland Kitchen oxyCODONE-acetaminophen (PERCOCET) 5-325 MG tablet    Sig: Take 1 tablet by mouth every 6 (six) hours as needed for up to 7 days for severe pain. Must last 7 days.    Dispense:  28 tablet    Refill:  0    For acute post-operative pain. Not to be refilled. Most last 7 days.  Marland Kitchen oxyCODONE-acetaminophen (PERCOCET) 5-325 MG tablet    Sig: Take 1 tablet by mouth every 6 (six) hours as needed for up to 7 days for severe pain. Must last 7 days.    Dispense:  28 tablet    Refill:  0    For acute post-operative pain. Not to be refilled. Must last 7 days.  . pregabalin (LYRICA) 75 MG capsule    Sig: Take 1 capsule (75 mg total) by mouth 2 (two) times daily for 7 days, THEN 1 capsule (75 mg total) 3 (three) times daily for 15 days.    Dispense:  59 capsule    Refill:  0    Fill one day early if pharmacy is closed on scheduled refill date. May substitute for generic if available.  . pregabalin (LYRICA) 150 MG capsule    Sig: Take 1 capsule (150 mg total) by mouth  2 (two) times daily for 15 days, THEN 1 capsule (150 mg total) 3 (three) times daily for 15 days.    Dispense:  75 capsule    Refill:  0    Fill one day early if pharmacy is closed on scheduled refill date. May substitute for generic if available.   Orders:  No orders of the defined types were placed in this encounter.  Follow-up plan:   Return in about 5 weeks (around 04/05/2019) for (VV), (PP) s/p bilateral lumbar facet RFA.      Interventional treatment options: Planned, scheduled, and/or pending:   Palliative left-sided lumbar facet RFA #2 under fluoroscopic guidance and IV sedation   Under consideration:   Diagnostic bilateral IA hip joint injection  Diagnostic left IA hip joint injection    Therapeutic/palliative (PRN):   Palliative bilateral lumbar facet block #4  Palliative left L1  transverse process injection #2  Palliative/Therapeutic righ lumbar facet RFA #2 (last done 06/23/2016) Palliative/Therapeutic left lumbar facet RFA #2 (last done 02/07/2019)      Recent Visits Date Type Provider Dept  02/21/19 Procedure visit Milinda Pointer, MD [facility]  02/07/19 Procedure visit Milinda Pointer, MD [facility]  01/02/19 Telemedicine Milinda Pointer, MD [facility]  12/14/18 Telemedicine Milinda Pointer, MD [facility]  Showing recent visits within past 90 days and meeting all other requirements   Today's Visits Date Type Provider Dept  02/27/19 Telemedicine Milinda Pointer, MD [facility]  Showing today's visits and meeting all other requirements   Future Appointments Date Type Provider Dept  04/04/19 Appointment Milinda Pointer, MD [facility]  Showing future appointments within next 90 days and meeting all other requirements   I discussed the assessment and treatment plan with the patient. The patient was provided an opportunity to ask questions and all were answered. The patient agreed with the plan and demonstrated an understanding of the instructions.  Patient advised to call back or seek an in-person evaluation if the symptoms or condition worsens.  Duration of encounter: 20 minutes.  Note by: Gaspar Cola, MD Date: 02/27/2019; Time: 12:37 PM

## 2019-02-27 ENCOUNTER — Ambulatory Visit: Payer: Medicare Other | Attending: Pain Medicine | Admitting: Pain Medicine

## 2019-02-27 ENCOUNTER — Other Ambulatory Visit: Payer: Self-pay

## 2019-02-27 DIAGNOSIS — M25552 Pain in left hip: Secondary | ICD-10-CM

## 2019-02-27 DIAGNOSIS — M25551 Pain in right hip: Secondary | ICD-10-CM

## 2019-02-27 DIAGNOSIS — M792 Neuralgia and neuritis, unspecified: Secondary | ICD-10-CM

## 2019-02-27 DIAGNOSIS — G8918 Other acute postprocedural pain: Secondary | ICD-10-CM

## 2019-02-27 DIAGNOSIS — M545 Low back pain, unspecified: Secondary | ICD-10-CM

## 2019-02-27 DIAGNOSIS — M25561 Pain in right knee: Secondary | ICD-10-CM

## 2019-02-27 DIAGNOSIS — G8929 Other chronic pain: Secondary | ICD-10-CM

## 2019-02-27 DIAGNOSIS — G894 Chronic pain syndrome: Secondary | ICD-10-CM

## 2019-02-27 DIAGNOSIS — M25562 Pain in left knee: Secondary | ICD-10-CM

## 2019-02-27 MED ORDER — PREGABALIN 75 MG PO CAPS: ORAL_CAPSULE | ORAL | 0 refills | Status: DC

## 2019-02-27 MED ORDER — OXYCODONE-ACETAMINOPHEN 5-325 MG PO TABS: 1.0000 | ORAL_TABLET | Freq: Four times a day (QID) | ORAL | 0 refills | Status: DC | PRN

## 2019-02-27 MED ORDER — PREGABALIN 150 MG PO CAPS: ORAL_CAPSULE | ORAL | 0 refills | Status: DC

## 2019-02-27 MED ORDER — PREGABALIN 50 MG PO CAPS: ORAL_CAPSULE | ORAL | 0 refills | Status: DC

## 2019-02-28 ENCOUNTER — Encounter: Payer: Self-pay | Admitting: Pain Medicine

## 2019-02-28 ENCOUNTER — Other Ambulatory Visit: Payer: Self-pay | Admitting: Pain Medicine

## 2019-02-28 DIAGNOSIS — G8929 Other chronic pain: Secondary | ICD-10-CM

## 2019-02-28 MED ORDER — TIZANIDINE HCL 4 MG PO TABS: 4.0000 mg | ORAL_TABLET | Freq: Three times a day (TID) | ORAL | 5 refills | Status: DC | PRN

## 2019-03-19 ENCOUNTER — Encounter: Payer: Self-pay | Admitting: Pain Medicine

## 2019-03-21 ENCOUNTER — Telehealth: Payer: Self-pay | Admitting: Pain Medicine

## 2019-03-21 NOTE — Telephone Encounter (Signed)
Patient states she is really having a lot of pain with this last RF and wants to know if Dr. Dossie Arbour will write her 1 wk of the extra pain med that he gave her to help with the extra pain. Says severe pain right across area she received the RF. Please call with any solutions and to let her know if Dr. Dossie Arbour will write med.

## 2019-03-22 ENCOUNTER — Encounter: Payer: Self-pay | Admitting: Pain Medicine

## 2019-03-28 ENCOUNTER — Other Ambulatory Visit: Payer: Self-pay | Admitting: Pain Medicine

## 2019-03-28 DIAGNOSIS — G8918 Other acute postprocedural pain: Secondary | ICD-10-CM

## 2019-03-28 MED ORDER — OXYCODONE-ACETAMINOPHEN 5-325 MG PO TABS: 1.0000 | ORAL_TABLET | Freq: Four times a day (QID) | ORAL | 0 refills | Status: DC | PRN

## 2019-04-03 ENCOUNTER — Encounter: Payer: Self-pay | Admitting: Pain Medicine

## 2019-04-03 NOTE — Progress Notes (Signed)
Pain relief after procedure (treated area only): (Questions asked to patient) 1. Starting about 15 minutes after the procedure, and "while the area was still numb" (from the local anesthetics), were you having any of your usual pain "in that area" (the treated area)?  (NOTE: NOT including the discomfort from the needle sticks.) First 1 hour: 100 % better. First 4-6 hours: 100 % better. 2. How long did the numbness from the local anesthetics last? (More than 6 hours?) Duration: 30 hours.  3. How much better is your pain now, when compared to before the procedure? Current benefit: 0 % better. 4. Can you move better now? Improvement in ROM (Range of Motion): No. 5. Can you do more now? Improvement in function: No. 4. Did you have any problems with the procedure? Side-effects/Complications: No.

## 2019-04-04 ENCOUNTER — Other Ambulatory Visit: Payer: Self-pay

## 2019-04-04 ENCOUNTER — Ambulatory Visit: Payer: Medicare Other | Attending: Pain Medicine | Admitting: Pain Medicine

## 2019-04-04 DIAGNOSIS — M545 Low back pain: Secondary | ICD-10-CM | POA: Diagnosis not present

## 2019-04-04 DIAGNOSIS — G8929 Other chronic pain: Secondary | ICD-10-CM

## 2019-04-04 DIAGNOSIS — M47816 Spondylosis without myelopathy or radiculopathy, lumbar region: Secondary | ICD-10-CM

## 2019-04-04 MED ORDER — PREDNISONE 20 MG PO TABS: ORAL_TABLET | ORAL | 0 refills | Status: AC

## 2019-04-04 NOTE — Progress Notes (Signed)
Patient: Kelly Rollins  Service Category: E/M  Provider: Gaspar Cola, MD  DOB: May 12, 1970  DOS: 04/04/2019  Location: Office  MRN: [id number]  Setting: Ambulatory outpatient  Referring Provider: Donnamarie Rollins,*  Type: Established Patient  Specialty: Interventional Pain Management  PCP: Kelly Rossetti, PA-C  Location: Remote location  Delivery: TeleHealth     Virtual Encounter - Pain Management PROVIDER NOTE: Information contained herein reflects review and annotations entered in association with encounter. Interpretation of such information and data should be left to medically-trained personnel. Information provided to patient can be located elsewhere in the medical record under "Patient Instructions". Document created using STT-dictation technology, any transcriptional errors that may result from process are unintentional.    Contact & Pharmacy Preferred: [phone] Home: [phone] (home) Mobile: [phone] (mobile) E-mail: [email or web address] .Kelly Rollins, [place] [address] [place] [address] Phone: [phone] Fax: [phone]   Pre-screening  Kelly Rollins offered "in-person" vs "virtual" encounter. She indicated preferring virtual for this encounter.   Reason COVID-19*  Social distancing based on CDC and AMA recommendations.   I contacted Kelly Rollins on 04/04/2019 via telephone.      I clearly identified myself as Gaspar Cola, MD. I verified that I was speaking with the correct person using two identifiers (Name: Kelly Rollins, and date of birth: [birth year]).  Consent I sought verbal advanced consent from Kelly Rollins for virtual visit interactions. I informed Kelly Rollins of possible security and privacy concerns, risks, and limitations associated with providing "not-in-person" medical evaluation and management services. I also informed Kelly Rollins of the availability of "in-person"  appointments. Finally, I informed her that there would be a charge for the virtual visit and that she could be  personally, fully or partially, financially responsible for it. Kelly Rollins expressed understanding and agreed to proceed.   Historic Elements   Kelly Rollins is a 49 y.o. year old, female patient evaluated today after her last contact with our practice on 03/21/2019. Kelly Rollins  has a past medical history of ABDOMINAL PAIN OTHER SPECIFIED SITE (02/21/2009), ABDOMINAL PAIN-LUQ (04/17/2009), Anal fissure, Asthma, Diabetes mellitus without complication ([place]), OMAYOKHT(977.4), HEMATOCHEZIA (04/17/2009), Hypertension, IBS (irritable bowel syndrome), Migraine headache (11/06/2008), OVARIAN CYST, RIGHT (02/21/2009), PALPITATIONS, OCCASIONAL (04/17/2010), and Pancreatitis (10/14/2017). She also  has a past surgical history that includes Cesarean section; Vaginal hysterectomy; Cholecystectomy; Breast reduction surgery; Breast excisional biopsy (Left, 1997); and Reduction mammaplasty (Bilateral, 1997). Kelly Rollins has a current medication list which includes the following prescription(s): amlodipine, artificial tears, metoprolol tartrate, novolog mix 70/30, omeprazole, oxycodone-acetaminophen, [START ON 04/19/2019] pregabalin, pregabalin, rosuvastatin, tizanidine, venlafaxine xr, and prednisone. She  reports that she quit smoking about 4 years ago. Her smoking use included cigarettes. She has never used smokeless tobacco. She reports current alcohol use. She reports that she does not use drugs. Kelly Rollins is allergic to atorvastatin; levofloxacin; morphine; penicillins; and tramadol hcl.   HPI  Today, she is being contacted for a post-procedure assessment.  With this radiofrequency ablation we have completed the bilateral lumbar facet RFA.  The left side was done on 02/07/2019 in the right side on 02/21/2019.  Prior to that we had done the right side on 06/23/2016 and the left side on 06/08/2016.  According to  the patient this has not worked as well as the first 1 that we did in 2018.  There was really not much of a difference in terms of what we  did and therefore I am not quite sure what is going on.  Today the patient asked for another round of opioid analgesics, but this time I have told her that I will not renew it.  If she still having some pain and discomfort, I rather have her take a round of oral steroids to see if there is some remaining inflammation in the area.  I really do not want to be going into a long-term recommend of opioid analgesics with her since we have done that in the past and it did not work well.  Today I have proposed to the patient to repeat her 2018 MRI, should pain continue.  She wants to go ahead and give it a little bit more time before we do that and I will have any problems with it.  She currently indicates her pain to be still in the lower back and going down both lower extremities to the level of the knee with the left being worse than the right.  Her prior MRI did show a broad-based disc bulge with a small central disc protrusion at the L5-S1, which may be contributing to her current symptoms.  In addition, there is a possibility that her condition has progressed and we may be dealing with something different at this point.  It has been at least 2-1/2 years since her last MRI and according to her her symptoms have been worsening.  In terms of her Lyrica, she is tolerating it well and she is already up to 225 mg p.o. 3 times daily.  Post-Procedure Evaluation  Procedure (03/21/2019): Therapeutic right-sided lumbar facet RFA #2 under fluoroscopic guidance and IV sedation Pre-procedure pain level:  5/10 Post-procedure: 0/10 (100% relief)  Sedation: Sedation provided.  [name], RN  04/03/2019  9:32 AM  Signed Pain relief after procedure (treated area only): (Questions asked to patient) 1. Starting about 15 minutes after the procedure, and "while the area was still numb"  (from the local anesthetics), were you having any of your usual pain "in that area" (the treated area)?  (NOTE: NOT including the discomfort from the needle sticks.) First 1 hour: 100 % better. First 4-6 hours: 100 % better. 2. How long did the numbness from the local anesthetics last? (More than 6 hours?) Duration: 30 hours.  3. How much better is your pain now, when compared to before the procedure? Current benefit: 0 % better. 4. Can you move better now? Improvement in ROM (Range of Motion): No. 5. Can you do more now? Improvement in function: No. 4. Did you have any problems with the procedure? Side-effects/Complications: No.  Current benefits: Defined as benefit that persist at this time.   Analgesia:  No benefit up to this point.  She thinks that it is just going to take a little longer. Function: No improvement ROM: No improvement  Pharmacotherapy Assessment  Analgesic: No opioid analgesic prescriptions from our practice.  Hydrocodone/APAP 5/325, 1 tab PO q 8 hrs (15 mg/day of hydrocodone) (written by Lavonia Drafts. Whitaker, PA-C) #90 (on 10/31/2018) MME/day:52m/day.   Monitoring: [place] PMP: PDMP reviewed during this encounter.       Pharmacotherapy: No side-effects or adverse reactions reported. Compliance: No problems identified. Effectiveness: Clinically acceptable. Plan: Refer to "POC".  UDS:  Summary  Date Value Ref Range Status  01/18/2017 FINAL  Final    Comment:    ==================================================================== TOXASSURE SELECT 13 (MW) ==================================================================== Test  Result       Flag       Units Drug Absent but Declared for Prescription Verification   Oxycodone                      Not Detected UNEXPECTED ng/mg creat ==================================================================== Test                      Result    Flag   Units      Ref Range   Creatinine               209              mg/dL      >=20 ==================================================================== Declared Medications:  The flagging and interpretation on this report are based on the  following declared medications.  Unexpected results may arise from  inaccuracies in the declared medications.  **Note: The testing scope of this panel includes these medications:  Oxycodone  **Note: The testing scope of this panel does not include following  reported medications:  Acetaminophen  Albuterol  Aspirin  Caffeine  Gabapentin  Ibuprofen  Magnesium  Metformin  Metoprolol (Toprol)  Multivitamin (MVI)  Ondansetron (Zofran)  Venlafaxine (Effexor)  Vitamin C  Vitamin D3 ==================================================================== For clinical consultation, please call [phone]. ====================================================================    Laboratory Chemistry Profile   Renal Lab Results  Component Value Date   BUN 10 11/05/2017   CREATININE 0.85 11/05/2017   BCR 14 05/06/2017   GFR 88.95 03/19/2010   GFRAA >60 11/05/2017   GFRNONAA >60 11/05/2017    Hepatic Lab Results  Component Value Date   AST 48 (H) 11/05/2017   ALT 41 11/05/2017   ALBUMIN 3.7 11/05/2017   ALKPHOS 153 (H) 11/05/2017   AMYLASE 40 04/03/2010   LIPASE 27 11/05/2017    Electrolytes Lab Results  Component Value Date   NA 137 11/05/2017   K 3.9 11/05/2017   CL 100 11/05/2017   CALCIUM 9.5 11/05/2017   MG 2.1 11/16/2018   PHOS 4.4 10/20/2017    Bone Lab Results  Component Value Date   25OHVITD1 23 (L) 11/16/2018   25OHVITD2 4.0 11/16/2018   25OHVITD3 19 11/16/2018    Inflammation (CRP: Acute Phase) (ESR: Chronic Phase) Lab Results  Component Value Date   CRP 2 11/16/2018   ESRSEDRATE 40 (H) 11/16/2018   LATICACIDVEN 2.0 ([place]) 02/03/2017      Note: Above Lab results reviewed.  Imaging  DG PAIN CLINIC C-ARM 1-60 MIN NO REPORT Fluoro was used, but no Radiologist  interpretation will be provided.  Please refer to "NOTES" tab for provider progress note.  Assessment  The primary encounter diagnosis was Chronic low back pain (Primary Source of Pain) (Bilateral) (L>R). A diagnosis of Lumbar facet syndrome (Bilateral) (L>R) was also pertinent to this visit.  Plan of Care  Problem-specific:  No problem-specific Assessment & Plan notes found for this encounter.  Kelly Rollins has a current medication list which includes the following long-term medication(s): amlodipine, metoprolol tartrate, novolog mix 70/30, omeprazole, oxycodone-acetaminophen, [START ON 04/19/2019] pregabalin, pregabalin, tizanidine, and venlafaxine xr.  Pharmacotherapy (Medications Ordered): Meds ordered this encounter  Medications  . predniSONE (DELTASONE) 20 MG tablet    Sig: Take 3 tablets (60 mg total) by mouth daily with breakfast for 3 days, THEN 2 tablets (40 mg total) daily with breakfast for 3 days, THEN 1 tablet (20 mg total) daily with breakfast for 3 days.  Dispense:  18 tablet    Refill:  0   Orders:  No orders of the defined types were placed in this encounter.  Follow-up plan:   Return in about 3 weeks (around 04/25/2019) for (VV), (MM) to evaluate results of steroid pack.      Interventional treatment options: Planned, scheduled, and/or pending:   Palliative left-sided lumbar facet RFA #2 under fluoroscopic guidance and IV sedation   Under consideration:   Diagnostic bilateral IA hip joint injection  Diagnostic left IA hip joint injection    Therapeutic/palliative (PRN):   Palliative bilateral lumbar facet block #4  Palliative left L1 transverse process injection #2  Palliative/Therapeutic righ lumbar facet RFA #2 (last done 06/23/2016) Palliative/Therapeutic left lumbar facet RFA #2 (last done 02/07/2019)       Recent Visits Date Type Provider Dept  02/27/19 Telemedicine Milinda Pointer, MD [facility]  02/21/19 Procedure visit  Milinda Pointer, MD [facility]  02/07/19 Procedure visit Milinda Pointer, MD [facility]  Showing recent visits within past 90 days and meeting all other requirements   Today's Visits Date Type Provider Dept  04/04/19 Telemedicine Milinda Pointer, MD [facility]  Showing today's visits and meeting all other requirements   Future Appointments No visits were found meeting these conditions.  Showing future appointments within next 90 days and meeting all other requirements   I discussed the assessment and treatment plan with the patient. The patient was provided an opportunity to ask questions and all were answered. The patient agreed with the plan and demonstrated an understanding of the instructions.  Patient advised to call back or seek an in-person evaluation if the symptoms or condition worsens.  Duration of encounter: 13 minutes.  Note by: Gaspar Cola, MD Date: 04/04/2019; Time: 10:18 AM

## 2019-04-08 IMAGING — DX DG ABDOMEN 1V
1 series · 1 of 1 positions shown · non-contrast
Comparison: CT abdomen pelvis of 10/14/2017 and SI joint films of 4
[facility] [id number]

CLINICAL DATA: Abdominal pain

EXAM:
ABDOMEN - 1 VIEW

[abdomen supine]
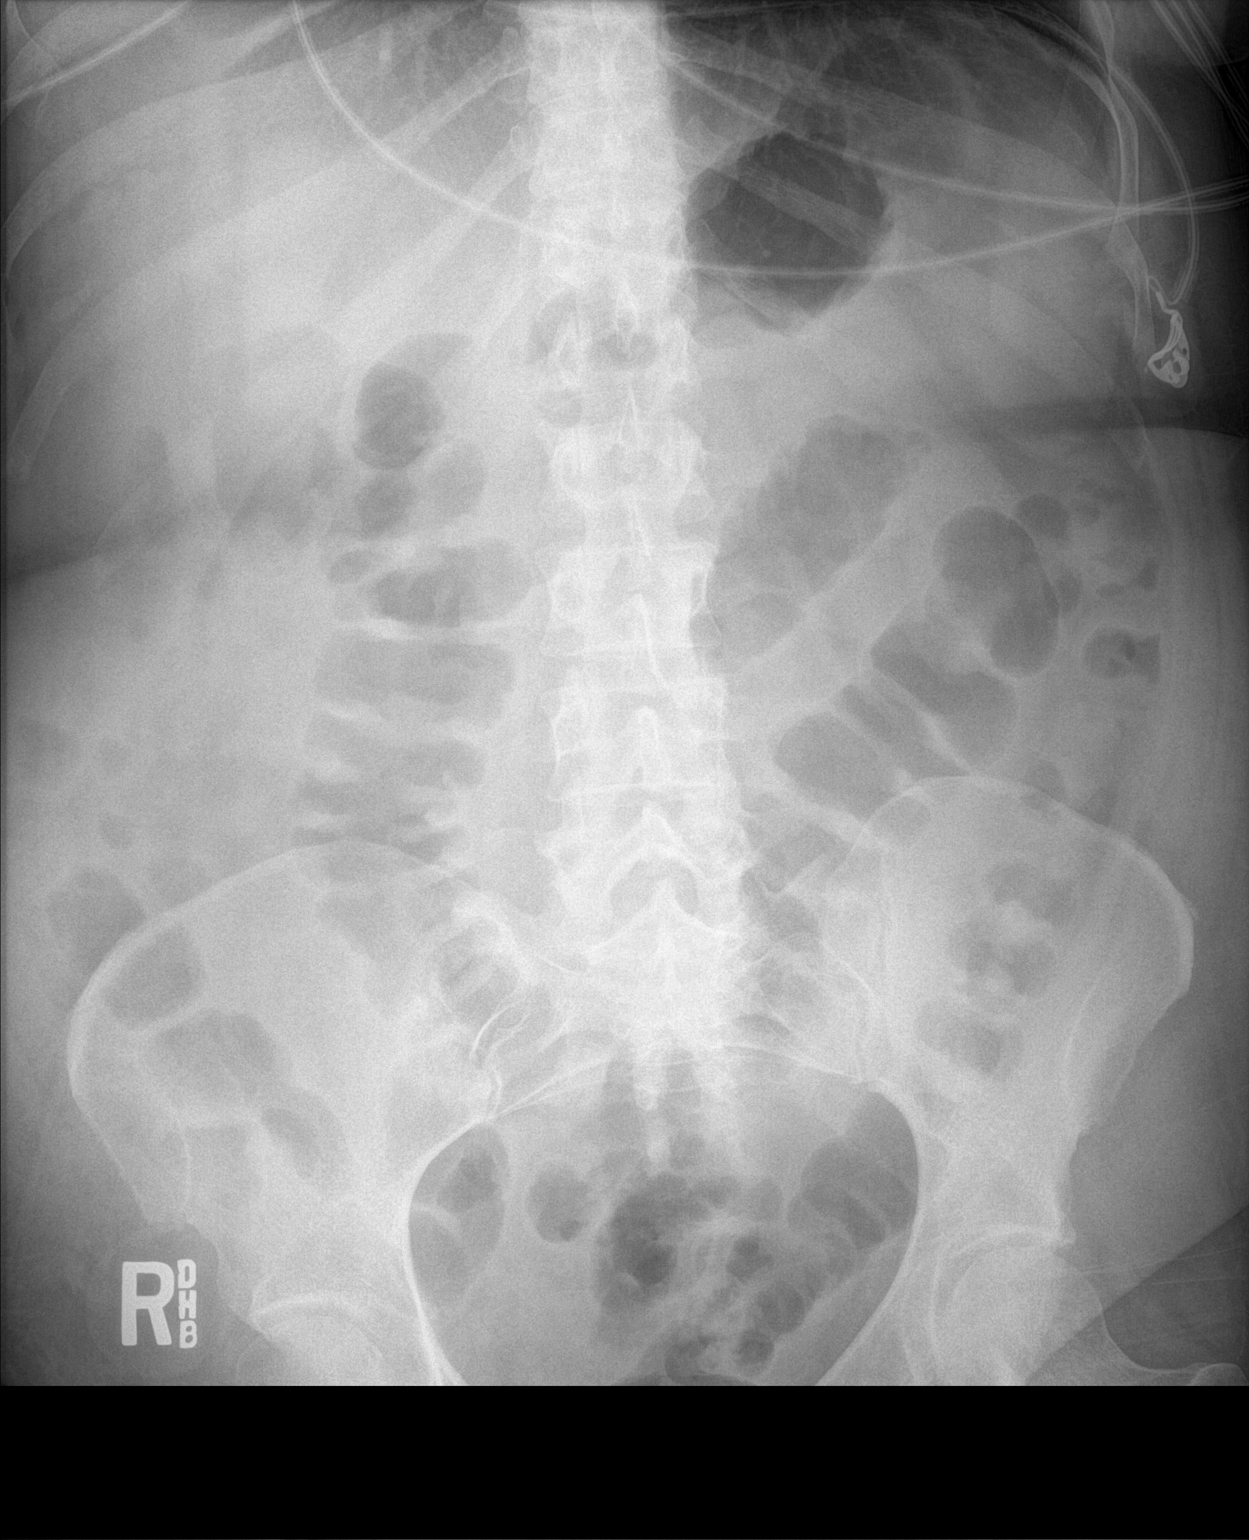

[1 of 1 positions shown; findings below may reference images not displayed]

FINDINGS: Supine film of the abdomen shows a nonspecific bowel gas pattern.
There is colonic bowel gas present with only small amount of small
bowel gas. May be mild ileus with slightly prominent small bowel
loop adjacent to the region of the pancreas possibly due to the
previously demonstrated pancreatitis. No opaque calculi are seen.
The bones are unremarkable.
IMPRESSION: 1. No bowel obstruction.
2. Possible mild ileus within the upper epigastrium most likely
related to the inflammation of pancreatitis.

## 2019-04-12 IMAGING — CR DG ABDOMEN 1V
2 series · 2 of 2 positions shown · non-contrast
Comparison: CT, 10/20/2017

CLINICAL DATA: PANCREATITIS, ABD DISTENTION, LAST BM 2 WEEKS AGO,
PT DRANK 1 CUP OF CT CONTRAST BEFORE CT WAS CANCELED

EXAM:
ABDOMEN - 1 VIEW

[abdomen kub (1 of 2)]
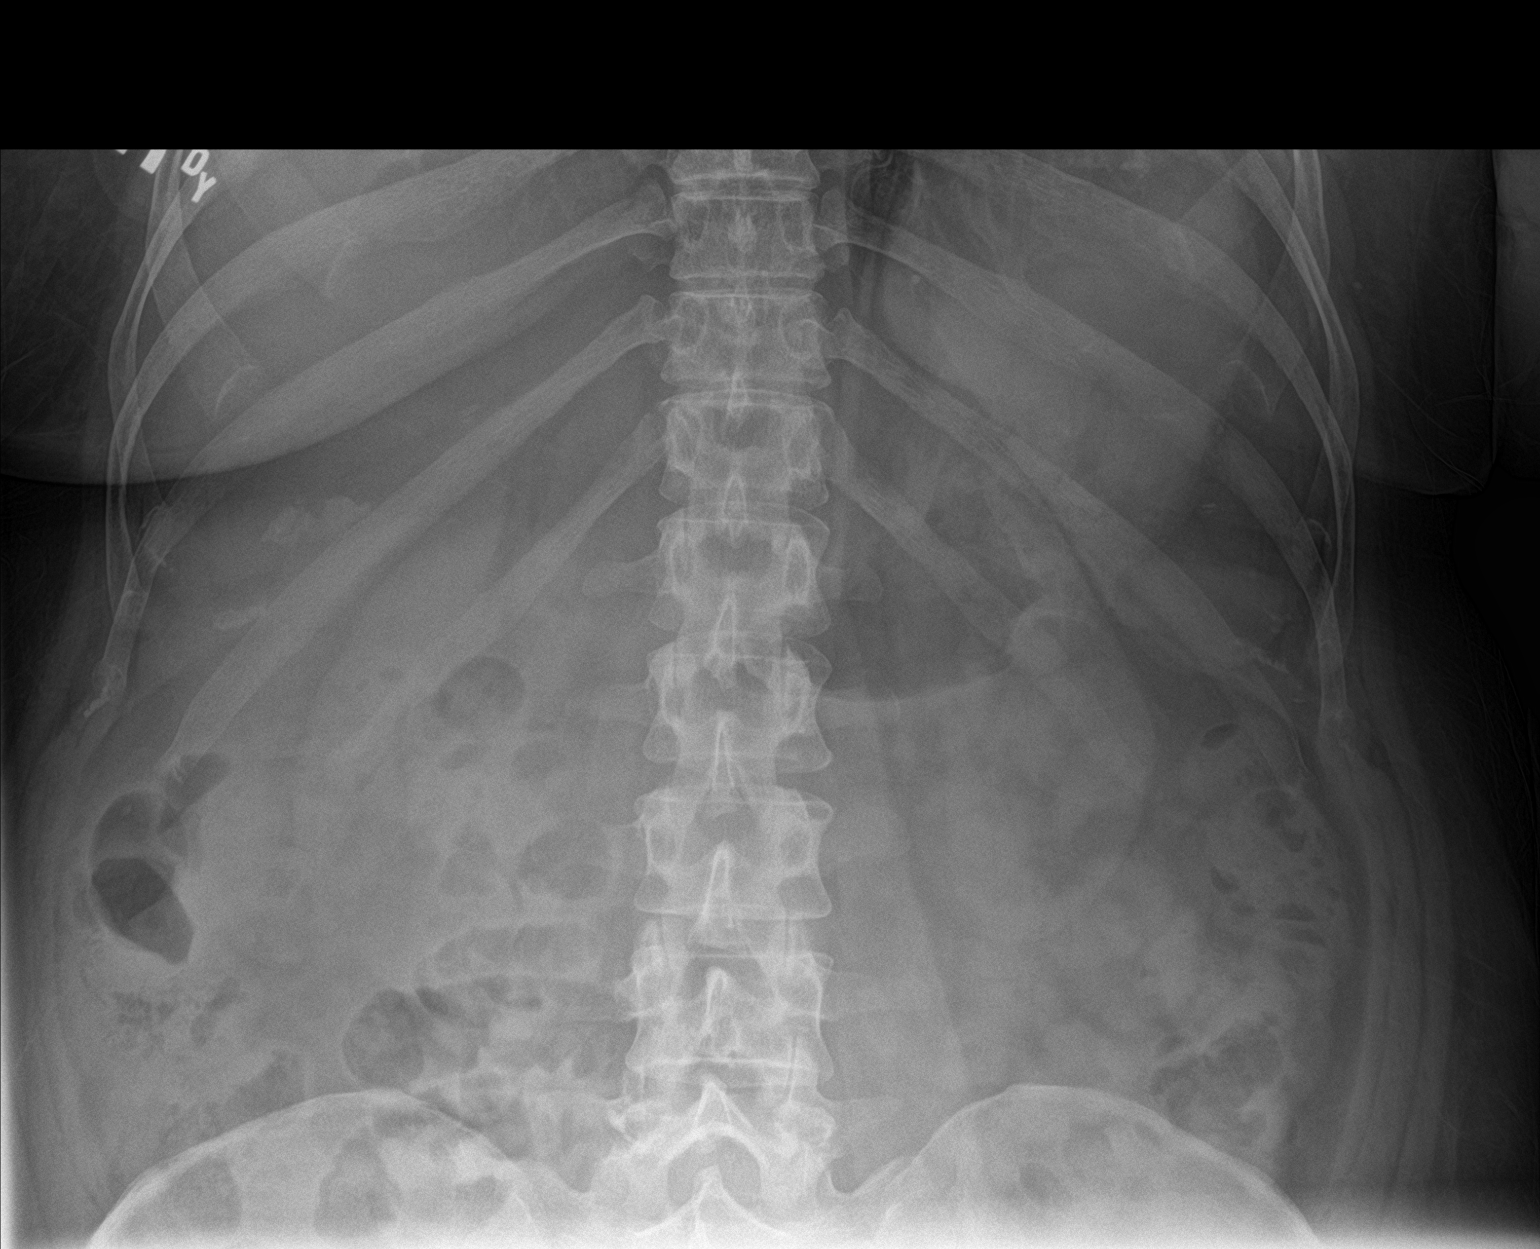

[abdomen kub (2 of 2)]
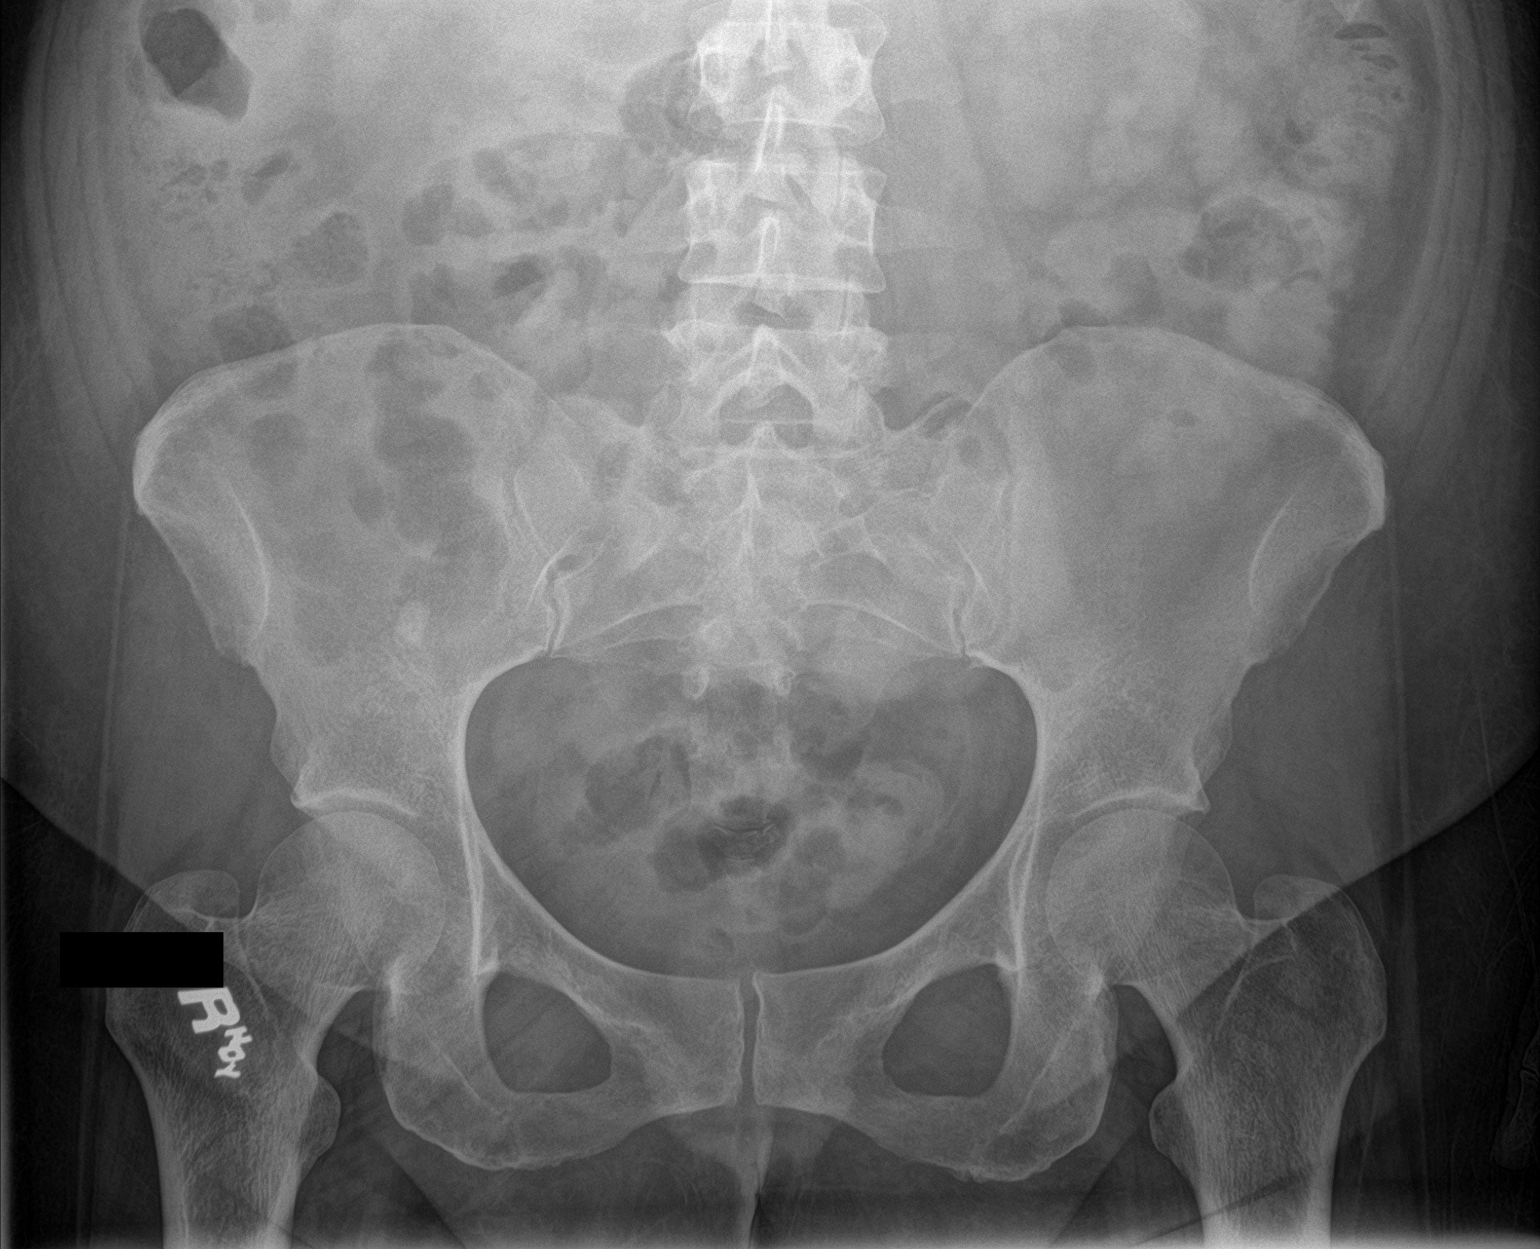

[2 of 2 positions shown; findings below may reference images not displayed]

FINDINGS: Normal bowel gas pattern.

No evidence of renal or ureteral stones. Small density in the right
lower quadrant which likely reflects some residual contrast in the
cecum. Soft tissues otherwise unremarkable.

No significant skeletal abnormality.
IMPRESSION: No acute findings. No evidence of bowel obstruction. No bowel
dilation to suggest diffuse adynamic ileus.

## 2019-04-24 ENCOUNTER — Telehealth: Payer: Self-pay

## 2019-04-24 ENCOUNTER — Encounter: Payer: Self-pay | Admitting: Pain Medicine

## 2019-04-24 NOTE — Telephone Encounter (Signed)
Attempted to call patient for virtual appointment questions.  No voicemail available.

## 2019-04-25 ENCOUNTER — Ambulatory Visit: Payer: Medicare Other | Attending: Pain Medicine | Admitting: Pain Medicine

## 2019-04-25 ENCOUNTER — Other Ambulatory Visit: Payer: Self-pay

## 2019-04-25 DIAGNOSIS — G8918 Other acute postprocedural pain: Secondary | ICD-10-CM

## 2019-04-25 DIAGNOSIS — M545 Low back pain: Secondary | ICD-10-CM | POA: Diagnosis not present

## 2019-04-25 DIAGNOSIS — M25552 Pain in left hip: Secondary | ICD-10-CM

## 2019-04-25 DIAGNOSIS — M25562 Pain in left knee: Secondary | ICD-10-CM

## 2019-04-25 DIAGNOSIS — G894 Chronic pain syndrome: Secondary | ICD-10-CM | POA: Diagnosis not present

## 2019-04-25 DIAGNOSIS — M25551 Pain in right hip: Secondary | ICD-10-CM | POA: Diagnosis not present

## 2019-04-25 DIAGNOSIS — M25561 Pain in right knee: Secondary | ICD-10-CM | POA: Diagnosis not present

## 2019-04-25 DIAGNOSIS — G8929 Other chronic pain: Secondary | ICD-10-CM

## 2019-04-25 NOTE — Progress Notes (Signed)
Patient: Kelly Rollins  Service Category: E/M  Provider: Gaspar Cola, MD  DOB: [birth year]  DOS: 04/25/2019  Location: Office  MRN: [id number]  Setting: Ambulatory outpatient  Referring Provider: [name],*  Type: Established Patient  Specialty: Interventional Pain Management  PCP: [name], PA-C  Location: Remote location  Delivery: TeleHealth     Virtual Encounter - Pain Management PROVIDER NOTE: Information contained herein reflects review and annotations entered in association with encounter. Interpretation of such information and data should be left to medically-trained personnel. Information provided to patient can be located elsewhere in the medical record under "Patient Instructions". Document created using STT-dictation technology, any transcriptional errors that may result from process are unintentional.    Contact & Pharmacy Preferred: [phone] Home: [phone] (home) Mobile: [phone] (mobile) E-mail: [email or web address], [place] [address] [place] [address] Phone: [phone] Fax: [phone]   Pre-screening  Kelly Rollins offered "in-person" vs "virtual" encounter. She indicated preferring virtual for this encounter.   Reason COVID-19*  Social distancing based on CDC and AMA recommendations.   I contacted Kelly Rollins on 04/25/2019 via telephone.      I clearly identified myself as Gaspar Cola, MD. I verified that I was speaking with the correct person using two identifiers (Name: Kelly Rollins, and date of birth: 1970/03/14).  Consent I sought verbal advanced consent from Kelly Rollins for virtual visit interactions. I informed Kelly Rollins of possible security and privacy concerns, risks, and limitations associated with providing "not-in-person" medical evaluation and management services. I also informed Kelly Rollins of the availability of  "in-person" appointments. Finally, I informed her that there would be a charge for the virtual visit and that she could be  personally, fully or partially, financially responsible for it. Kelly Rollins expressed understanding and agreed to proceed.   Historic Elements   Kelly Rollins is a 49 y.o. year old, female patient evaluated today after her last contact with our practice on 04/24/2019. Kelly Rollins  has a past medical history of ABDOMINAL PAIN OTHER SPECIFIED SITE (02/21/2009), ABDOMINAL PAIN-LUQ (04/17/2009), Anal fissure, Asthma, Diabetes mellitus without complication ([place]), FIEPPIRJ(188.4), HEMATOCHEZIA (04/17/2009), Hypertension, IBS (irritable bowel syndrome), Migraine headache (11/06/2008), OVARIAN CYST, RIGHT (02/21/2009), PALPITATIONS, OCCASIONAL (04/17/2010), and Pancreatitis (10/14/2017). She also  has a past surgical history that includes Cesarean section; Vaginal hysterectomy; Cholecystectomy; Breast reduction surgery; Breast excisional biopsy (Left, 1997); and Reduction mammaplasty (Bilateral, 1997). Kelly Rollins has a current medication list which includes the following prescription(s): amlodipine, artificial tears, metoprolol tartrate, novolog mix 70/30, omeprazole, oxycodone-acetaminophen, rosuvastatin, tizanidine, and venlafaxine xr. She  reports that she quit smoking about 4 years ago. Her smoking use included cigarettes. She has never used smokeless tobacco. She reports current alcohol use. She reports that she does not use drugs. Kelly Rollins is allergic to atorvastatin; levofloxacin; morphine; penicillins; and tramadol hcl.   HPI  Today, she is being contacted for medication management.  Today's encounter is to evaluate the patient's results to the steroid taper prescribed.  The patient indicates doing much better after the steroid taper.  She now realizes that she had not given enough time for the radiofrequency to work.  I reminded her that after radiofrequencies, you can experience  more pain than usual for up to 6 weeks.  She is now doing much better and she feels that there is no need to do anything else for now.  Today I told the  patient to simply give [place] a call when the pain starts coming back.  Hopefully the radiofrequency will last quite some time and she will not need [place] for a while.  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  Currently she is not having any lower extremity symptoms.  Although she has been falling, this apparently has to do with her balance.  I have encouraged the patient to get an appointment with her primary care physician to make sure that this does not have anything to do with her in her ear or any other possible cause of imbalance.  Pharmacotherapy Assessment  Analgesic: No opioid analgesic prescriptions from our practice.  Hydrocodone/APAP 5/325, 1 tab PO q 8 hrs (15 mg/day of hydrocodone) (written by Lavonia Drafts. Whitaker, PA-C) #90 (on 10/31/2018) MME/day:50m/day.   Monitoring: [place] PMP: PDMP reviewed during this encounter.       Pharmacotherapy: No side-effects or adverse reactions reported. Compliance: No problems identified. Effectiveness: Clinically acceptable. Plan: Refer to "POC".  UDS:  Summary  Date Value Ref Range Status  01/18/2017 FINAL  Final    Comment:    ==================================================================== TOXASSURE SELECT 13 (MW) ==================================================================== Test                             Result       Flag       Units Drug Absent but Declared for Prescription Verification   Oxycodone                      Not Detected UNEXPECTED ng/mg creat ==================================================================== Test                      Result    Flag   Units      Ref Range   Creatinine              209              mg/dL       >=20 ==================================================================== Declared Medications:  The flagging and interpretation on this report are based on the  following declared medications.  Unexpected results may arise from  inaccuracies in the declared medications.  **Note: The testing scope of this panel includes these medications:  Oxycodone  **Note: The testing scope of this panel does not include following  reported medications:  Acetaminophen  Albuterol  Aspirin  Caffeine  Gabapentin  Ibuprofen  Magnesium  Metformin  Metoprolol (Toprol)  Multivitamin (MVI)  Ondansetron (Zofran)  Venlafaxine (Effexor)  Vitamin C  Vitamin D3 ==================================================================== For clinical consultation, please call [phone] ====================================================================    Laboratory Chemistry Profile   Renal Lab Results  Component Value Date   BUN 10 11/05/2017   CREATININE 0.85 11/05/2017   BCR 14 05/06/2017   GFR 88.95 03/19/2010   GFRAA >60 11/05/2017   GFRNONAA >60 11/05/2017    Hepatic Lab Results  Component Value Date   AST 48 (H) 11/05/2017   ALT 41 11/05/2017   ALBUMIN 3.7 11/05/2017   ALKPHOS 153 (H) 11/05/2017   AMYLASE 40 04/03/2010   LIPASE 27 11/05/2017    Electrolytes Lab Results  Component Value Date   NA 137 11/05/2017   K 3.9 11/05/2017   CL 100 11/05/2017   CALCIUM 9.5 11/05/2017   MG 2.1 11/16/2018   PHOS 4.4 10/20/2017  Bone Lab Results  Component Value Date   25OHVITD1 23 (L) 11/16/2018   25OHVITD2 4.0 11/16/2018   25OHVITD3 19 11/16/2018    Inflammation (CRP: Acute Phase) (ESR: Chronic Phase) Lab Results  Component Value Date   CRP 2 11/16/2018   ESRSEDRATE 40 (H) 11/16/2018   LATICACIDVEN 2.0 ([place]) 02/03/2017      Note: Above Lab results reviewed.  Imaging  DG PAIN CLINIC C-ARM 1-60 MIN NO REPORT Fluoro was used, but no Radiologist interpretation will be  provided.  Please refer to "NOTES" tab for provider progress note.  Assessment  The primary encounter diagnosis was Chronic pain syndrome. Diagnoses of Chronic low back pain (Primary Source of Pain) (Bilateral) (L>R), Chronic hip pain (Secondary source of pain) (Bilateral) (L>R), Chronic knee pain (Tertiary source of pain) (Bilateral) (L>R), and Acute postoperative pain were also pertinent to this visit.  Plan of Care  Problem-specific:  No problem-specific Assessment & Plan notes found for this encounter.  Kelly Rollins has a current medication list which includes the following long-term medication(s): amlodipine, metoprolol tartrate, novolog mix 70/30, omeprazole, oxycodone-acetaminophen, tizanidine, and venlafaxine xr.  Pharmacotherapy (Medications Ordered): No orders of the defined types were placed in this encounter.  Orders:  No orders of the defined types were placed in this encounter.  Follow-up plan:   No follow-ups on file.      Interventional treatment options: Planned, scheduled, and/or pending:   Palliative left-sided lumbar facet RFA #2 under fluoroscopic guidance and IV sedation   Under consideration:   Diagnostic bilateral IA hip joint injection  Diagnostic left IA hip joint injection    Therapeutic/palliative (PRN):   Palliative bilateral lumbar facet block #4  Palliative left L1 transverse process injection #2  Palliative/Therapeutic righ lumbar facet RFA #2 (last done 06/23/2016) Palliative/Therapeutic left lumbar facet RFA #2 (last done 02/07/2019)    Recent Visits Date Type Provider Dept  04/04/19 Telemedicine Milinda Pointer, MD [facility]  02/27/19 Telemedicine Milinda Pointer, MD [facility]  02/21/19 Procedure visit Milinda Pointer, MD [facility]  02/07/19 Procedure visit Milinda Pointer, MD [facility]  Showing recent visits within past 90 days and meeting all other requirements    Today's Visits Date Type Provider Dept  04/25/19 Telemedicine Milinda Pointer, MD [facility]  Showing today's visits and meeting all other requirements   Future Appointments No visits were found meeting these conditions.  Showing future appointments within next 90 days and meeting all other requirements   I discussed the assessment and treatment plan with the patient. The patient was provided an opportunity to ask questions and all were answered. The patient agreed with the plan and demonstrated an understanding of the instructions.  Patient advised to call back or seek an in-person evaluation if the symptoms or condition worsens.  Duration of encounter: 18 minutes.  Note by: Gaspar Cola, MD Date: 04/25/2019; Time: 4:10 PM

## 2019-05-24 ENCOUNTER — Other Ambulatory Visit: Payer: Self-pay | Admitting: Family Medicine

## 2019-05-24 DIAGNOSIS — R7989 Other specified abnormal findings of blood chemistry: Secondary | ICD-10-CM

## 2019-05-24 DIAGNOSIS — K760 Fatty (change of) liver, not elsewhere classified: Secondary | ICD-10-CM

## 2019-06-13 ENCOUNTER — Other Ambulatory Visit: Payer: Self-pay | Admitting: Family Medicine

## 2019-06-13 ENCOUNTER — Ambulatory Visit
Admission: RE | Admit: 2019-06-13 | Discharge: 2019-06-13 | Disposition: A | Discharge status: Home / Self Care | Payer: Medicare Other | Source: Ambulatory Visit | Attending: Family Medicine | Admitting: Family Medicine

## 2019-06-13 ENCOUNTER — Other Ambulatory Visit: Payer: Self-pay

## 2019-06-13 DIAGNOSIS — R7989 Other specified abnormal findings of blood chemistry: Secondary | ICD-10-CM

## 2019-06-13 DIAGNOSIS — K760 Fatty (change of) liver, not elsewhere classified: Secondary | ICD-10-CM

## 2019-07-18 DIAGNOSIS — Z8719 Personal history of other diseases of the digestive system: Secondary | ICD-10-CM | POA: Insufficient documentation

## 2019-08-31 ENCOUNTER — Encounter: Payer: Self-pay | Admitting: Pain Medicine

## 2019-09-19 ENCOUNTER — Other Ambulatory Visit: Payer: Self-pay

## 2019-09-19 ENCOUNTER — Ambulatory Visit: Payer: Medicare Other | Attending: Pain Medicine | Admitting: Pain Medicine

## 2019-09-19 DIAGNOSIS — M25551 Pain in right hip: Secondary | ICD-10-CM

## 2019-09-19 DIAGNOSIS — M25552 Pain in left hip: Secondary | ICD-10-CM

## 2019-09-19 DIAGNOSIS — M545 Low back pain: Secondary | ICD-10-CM

## 2019-09-19 DIAGNOSIS — M25561 Pain in right knee: Secondary | ICD-10-CM | POA: Diagnosis not present

## 2019-09-19 DIAGNOSIS — G8929 Other chronic pain: Secondary | ICD-10-CM

## 2019-09-19 DIAGNOSIS — G894 Chronic pain syndrome: Secondary | ICD-10-CM | POA: Diagnosis not present

## 2019-09-19 DIAGNOSIS — M25562 Pain in left knee: Secondary | ICD-10-CM

## 2019-09-19 MED ORDER — TIZANIDINE HCL 4 MG PO TABS: 4.0000 mg | ORAL_TABLET | Freq: Three times a day (TID) | ORAL | 2 refills | Status: DC | PRN

## 2019-09-19 NOTE — Progress Notes (Signed)
Patient: Kelly Rollins  Service Category: E/M  Provider: Gaspar Cola, MD  DOB: [birth year]  DOS: 09/19/2019  Location: Office  MRN: [id number]  Setting: Ambulatory outpatient  Referring Provider: [name],*  Type: Established Patient  Specialty: Interventional Pain Management  PCP: [name], PA-C  Location: Remote location  Delivery: TeleHealth     Virtual Encounter - Pain Management PROVIDER NOTE: Information contained herein reflects review and annotations entered in association with encounter. Interpretation of such information and data should be left to medically-trained personnel. Information provided to patient can be located elsewhere in the medical record under "Patient Instructions". Document created using STT-dictation technology, any transcriptional errors that may result from process are unintentional.    Contact & Pharmacy Preferred: [phone] Home: [phone] (home) Mobile: [phone] (mobile) E-mail: [email or web address], [place] - [address] [place] [address] Phone: [phone] Fax: [phone]   Pre-screening  Ms. Bland Span offered "in-person" vs "virtual" encounter. She indicated preferring virtual for this encounter.   Reason COVID-19*  Social distancing based on CDC and AMA recommendations.   I contacted Courtney Heys on 09/19/2019 via telephone.      I clearly identified myself as Gaspar Cola, MD. I verified that I was speaking with the correct person using two identifiers (Name: KINA SHIFFMAN, and date of birth: 1970/10/08).  Consent I sought verbal advanced consent from Courtney Heys for virtual visit interactions. I informed Ms. Monterosso of possible security and privacy concerns, risks, and limitations associated with providing "not-in-person" medical evaluation and management services. I also informed Ms. Rosenwald of the availability of  "in-person" appointments. Finally, I informed her that there would be a charge for the virtual visit and that she could be  personally, fully or partially, financially responsible for it. Ms. Depaolis expressed understanding and agreed to proceed.   Historic Elements   Ms. CANDIA KINGSBURY is a 49 y.o. year old, female patient evaluated today after her last contact with our practice on 04/24/2019. Ms. Pistilli  has a past medical history of ABDOMINAL PAIN OTHER SPECIFIED SITE (02/21/2009), ABDOMINAL PAIN-LUQ (04/17/2009), Anal fissure, Asthma, Diabetes mellitus without complication ([place]), LYYTKPTW(656.8), HEMATOCHEZIA (04/17/2009), Hypertension, IBS (irritable bowel syndrome), Migraine headache (11/06/2008), OVARIAN CYST, RIGHT (02/21/2009), PALPITATIONS, OCCASIONAL (04/17/2010), and Pancreatitis (10/14/2017). She also  has a past surgical history that includes Cesarean section; Vaginal hysterectomy; Cholecystectomy; Breast reduction surgery; Breast excisional biopsy (Left, 1997); and Reduction mammaplasty (Bilateral, 1997). Ms. Nuttle has a current medication list which includes the following prescription(s): amlodipine, artificial tears, metoprolol tartrate, novolog mix 70/30, omeprazole, oxycodone-acetaminophen, rosuvastatin, tizanidine, and venlafaxine xr. She  reports that she quit smoking about 5 years ago. Her smoking use included cigarettes. She has never used smokeless tobacco. She reports current alcohol use. She reports that she does not use drugs. Ms. Abelson is allergic to atorvastatin, levofloxacin, morphine, penicillins, and tramadol hcl.   HPI  Today, she is being contacted for medication management.  The patient indicates doing well with the current medication regimen. No adverse reactions or side effects reported to the medications.  The patient is doing well on her tizanidine (Zanaflex) 4 mg tablet, 1 tablet p.o. every 8 hours PRN (90/month).  Since she is stable on this regimen I will renew her  prescription to last until 12/18/2019.  Today we will be transferring this medication regimen to the patient's PCP.  Her next refill is due on 12/18/2019 at which time the patient has  been instructed to contact her PCP for that refill.  We will continue to be available to the patient for her interventional therapies.  In reviewing the patient's medications today the patient indicated not being able to take the meloxicam due to gastrointestinal side effects and she also indicates that she has tried the gabapentin (Neurontin) and the pregabalin (Lyrica) and but she seems to recall having had some problems with them.  She has also stated that she is not really interested in taking a whole lot of medications all the time.  At the beginning of this year she had a bilateral lumbar facet radiofrequency ablation.  She indicates that the postop.  Was rather rough.  She does admit that it is better than it was before, but she still having some day-to-day discomfort.  At this point she is not having any lower extremity problems but she does admit having occasional hip and knee pains.  The patient was informed that we are available should she need our evaluation services.  She indicated that she will call us when she needs this.  Pharmacotherapy Assessment  Analgesic: No opioid analgesic prescriptions from our practice.  Hydrocodone/APAP 5/325, 1 tab PO q 8 hrs (15 mg/day of hydrocodone) (written by Lavonia Drafts. Whitaker, PA-C) #90 (on 10/31/2018) MME/day:90m/day.   Monitoring: [place] PMP: PDMP reviewed during this encounter.       Pharmacotherapy: No side-effects or adverse reactions reported. Compliance: No problems identified. Effectiveness: Clinically acceptable. Plan: Refer to "POC".  UDS:  Summary  Date Value Ref Range Status  01/18/2017 FINAL  Final    Comment:    ==================================================================== TOXASSURE SELECT 13  (MW) ==================================================================== Test                             Result       Flag       Units Drug Absent but Declared for Prescription Verification   Oxycodone                      Not Detected UNEXPECTED ng/mg creat ==================================================================== Test                      Result    Flag   Units      Ref Range   Creatinine              209              mg/dL      >=20 ==================================================================== Declared Medications:  The flagging and interpretation on this report are based on the  following declared medications.  Unexpected results may arise from  inaccuracies in the declared medications.  **Note: The testing scope of this panel includes these medications:  Oxycodone  **Note: The testing scope of this panel does not include following  reported medications:  Acetaminophen  Albuterol  Aspirin  Caffeine  Gabapentin  Ibuprofen  Magnesium  Metformin  Metoprolol (Toprol)  Multivitamin (MVI)  Ondansetron (Zofran)  Venlafaxine (Effexor)  Vitamin C  Vitamin D3 ==================================================================== For clinical consultation, please call [phone] ====================================================================     Laboratory Chemistry Profile   Renal Lab Results  Component Value Date   BUN 10 11/05/2017   CREATININE 0.85 11/05/2017   BCR 14 05/06/2017   GFR 88.95 03/19/2010   GFRAA >60 11/05/2017   GFRNONAA >60 11/05/2017     Hepatic Lab Results  Component Value Date  AST 48 (H) 11/05/2017   ALT 41 11/05/2017   ALBUMIN 3.7 11/05/2017   ALKPHOS 153 (H) 11/05/2017   AMYLASE 40 04/03/2010   LIPASE 27 11/05/2017     Electrolytes Lab Results  Component Value Date   NA 137 11/05/2017   K 3.9 11/05/2017   CL 100 11/05/2017   CALCIUM 9.5 11/05/2017   MG 2.1 11/16/2018   PHOS 4.4 10/20/2017      Bone Lab Results  Component Value Date   25OHVITD1 23 (L) 11/16/2018   25OHVITD2 4.0 11/16/2018   25OHVITD3 19 11/16/2018     Inflammation (CRP: Acute Phase) (ESR: Chronic Phase) Lab Results  Component Value Date   CRP 2 11/16/2018   ESRSEDRATE 40 (H) 11/16/2018   LATICACIDVEN 2.0 ([place]) 02/03/2017       Note: Above Lab results reviewed.   Imaging  US Abdomen Complete CLINICAL DATA:  Elevated liver function tests.  EXAM: ABDOMEN ULTRASOUND COMPLETE  COMPARISON:  October 20, 2017.  FINDINGS: Gallbladder: Status post cholecystectomy.  Common bile duct: Diameter: 4 mm which is within normal limits.  Liver: No focal lesion identified. Increased echogenicity of hepatic parenchyma is noted consistent with hepatic steatosis or other diffuse hepatocellular disease. Portal vein is patent on color Doppler imaging with normal direction of blood flow towards the liver.  IVC: No abnormality visualized.  Pancreas: Visualized portion unremarkable.  Spleen: Size and appearance within normal limits.  Right Kidney: Length: 11 cm. Echogenicity within normal limits. No mass or hydronephrosis visualized.  Left Kidney: Length: 11.6 cm. Echogenicity within normal limits. No mass or hydronephrosis visualized.  Abdominal aorta: No aneurysm visualized.  Other findings: None.  IMPRESSION: Status post cholecystectomy. Increased echogenicity of hepatic parenchyma is noted consistent with hepatic steatosis or other diffuse hepatocellular disease.  Electronically Signed   By: Marijo Conception M.D.   On: 06/13/2019 15:53  Assessment  There were no encounter diagnoses.  Plan of Care  Problem-specific:  No problem-specific Assessment & Plan notes found for this encounter.  Ms. PATTI SHORB has a current medication list which includes the following long-term medication(s): amlodipine, metoprolol tartrate, novolog mix 70/30, omeprazole, oxycodone-acetaminophen, tizanidine, and  venlafaxine xr.  Pharmacotherapy (Medications Ordered): No orders of the defined types were placed in this encounter.  Orders:  No orders of the defined types were placed in this encounter.  Follow-up plan:   No follow-ups on file.      Interventional treatment options: Planned, scheduled, and/or pending:   Palliative left-sided lumbar facet RFA #2 under fluoroscopic guidance and IV sedation   Under consideration:   Diagnostic bilateral IA hip joint injection  Diagnostic left IA hip joint injection    Therapeutic/palliative (PRN):   Palliative bilateral lumbar facet block #4  Palliative left L1 transverse process injection #2  Palliative/Therapeutic righ lumbar facet RFA #2 (last done 06/23/2016) Palliative/Therapeutic left lumbar facet RFA #2 (last done 02/07/2019)     Recent Visits No visits were found meeting these conditions. Showing recent visits within past 90 days and meeting all other requirements Today's Visits Date Type Provider Dept  09/19/19 Appointment Milinda Pointer, MD [facility]  Showing today's visits and meeting all other requirements Future Appointments No visits were found meeting these conditions. Showing future appointments within next 90 days and meeting all other requirements  I discussed the assessment and treatment plan with the patient. The patient was provided an opportunity to ask questions and all were answered. The patient agreed with the plan and demonstrated  an understanding of the instructions.  Patient advised to call back or seek an in-person evaluation if the symptoms or condition worsens.  Duration of encounter: 15 minutes.  Note by: Gaspar Cola, MD Date: 09/19/2019; Time: 7:48 AM

## 2020-01-17 ENCOUNTER — Ambulatory Visit
Admission: RE | Admit: 2020-01-17 | Discharge: 2020-01-17 | Disposition: A | Discharge status: Home / Self Care | Payer: Medicare Other | Attending: Pain Medicine | Admitting: Pain Medicine

## 2020-01-17 ENCOUNTER — Encounter: Payer: Self-pay | Admitting: Pain Medicine

## 2020-01-17 ENCOUNTER — Other Ambulatory Visit: Payer: Self-pay

## 2020-01-17 ENCOUNTER — Ambulatory Visit
Admission: RE | Admit: 2020-01-17 | Discharge: 2020-01-17 | Disposition: A | Discharge status: Home / Self Care | Payer: Medicare Other | Source: Ambulatory Visit | Attending: Pain Medicine | Admitting: Pain Medicine

## 2020-01-17 ENCOUNTER — Other Ambulatory Visit
Admission: RE | Admit: 2020-01-17 | Discharge: 2020-01-17 | Disposition: A | Discharge status: Home / Self Care | Payer: Medicare Other | Source: Home / Self Care | Attending: Pain Medicine | Admitting: Pain Medicine

## 2020-01-17 ENCOUNTER — Ambulatory Visit: Payer: Medicare Other | Admitting: Pain Medicine

## 2020-01-17 VITALS — BP 150/93 | HR 92 | Temp 97.3°F | Resp 16 | Ht 64.0 in | Wt 180.0 lb

## 2020-01-17 DIAGNOSIS — M25562 Pain in left knee: Secondary | ICD-10-CM | POA: Insufficient documentation

## 2020-01-17 DIAGNOSIS — Z79899 Other long term (current) drug therapy: Secondary | ICD-10-CM | POA: Insufficient documentation

## 2020-01-17 DIAGNOSIS — M79605 Pain in left leg: Secondary | ICD-10-CM

## 2020-01-17 DIAGNOSIS — M5417 Radiculopathy, lumbosacral region: Secondary | ICD-10-CM | POA: Insufficient documentation

## 2020-01-17 DIAGNOSIS — M79604 Pain in right leg: Secondary | ICD-10-CM | POA: Insufficient documentation

## 2020-01-17 DIAGNOSIS — G894 Chronic pain syndrome: Secondary | ICD-10-CM | POA: Diagnosis not present

## 2020-01-17 DIAGNOSIS — M25561 Pain in right knee: Secondary | ICD-10-CM | POA: Insufficient documentation

## 2020-01-17 DIAGNOSIS — M792 Neuralgia and neuritis, unspecified: Secondary | ICD-10-CM | POA: Insufficient documentation

## 2020-01-17 DIAGNOSIS — M5136 Other intervertebral disc degeneration, lumbar region: Secondary | ICD-10-CM | POA: Insufficient documentation

## 2020-01-17 DIAGNOSIS — M25551 Pain in right hip: Secondary | ICD-10-CM

## 2020-01-17 DIAGNOSIS — M25552 Pain in left hip: Secondary | ICD-10-CM | POA: Insufficient documentation

## 2020-01-17 DIAGNOSIS — G8929 Other chronic pain: Secondary | ICD-10-CM

## 2020-01-17 DIAGNOSIS — Z789 Other specified health status: Secondary | ICD-10-CM | POA: Insufficient documentation

## 2020-01-17 DIAGNOSIS — M545 Low back pain, unspecified: Secondary | ICD-10-CM | POA: Insufficient documentation

## 2020-01-17 DIAGNOSIS — M899 Disorder of bone, unspecified: Secondary | ICD-10-CM

## 2020-01-17 DIAGNOSIS — M51369 Other intervertebral disc degeneration, lumbar region without mention of lumbar back pain or lower extremity pain: Secondary | ICD-10-CM

## 2020-01-17 DIAGNOSIS — E559 Vitamin D deficiency, unspecified: Secondary | ICD-10-CM

## 2020-01-17 LAB — COMPREHENSIVE METABOLIC PANEL
ALT: 31 U/L (ref 0–44)
AST: 25 U/L (ref 15–41)
Albumin: 4.1 g/dL (ref 3.5–5.0)
Alkaline Phosphatase: 109 U/L (ref 38–126)
Anion gap: 10 (ref 5–15)
BUN: 12 mg/dL (ref 6–20)
CO2: 27 mmol/L (ref 22–32)
Calcium: 9.8 mg/dL (ref 8.9–10.3)
Chloride: 95 mmol/L — ABNORMAL LOW (ref 98–111)
Creatinine, Ser: 0.66 mg/dL (ref 0.44–1.00)
GFR, Estimated: >60 mL/min (ref >60)
Glucose, Bld: 345 mg/dL — ABNORMAL HIGH (ref 70–99)
Potassium: 4.4 mmol/L (ref 3.5–5.1)
Sodium: 132 mmol/L — ABNORMAL LOW (ref 135–145)
Total Bilirubin: 0.7 mg/dL (ref 0.3–1.2)
Total Protein: 8 g/dL (ref 6.5–8.1)

## 2020-01-17 LAB — MAGNESIUM: Magnesium: 2.1 mg/dL (ref 1.7–2.4)

## 2020-01-17 LAB — SEDIMENTATION RATE: Sed Rate: 17 mm/hr (ref 0–20)

## 2020-01-17 LAB — C-REACTIVE PROTEIN: CRP: 1.4 mg/dL — ABNORMAL HIGH (ref <1.0)

## 2020-01-17 LAB — VITAMIN B12: Vitamin B-12: 365 pg/mL (ref 180–914)

## 2020-01-17 MED ORDER — TIZANIDINE HCL 4 MG PO TABS: 4.0000 mg | ORAL_TABLET | Freq: Three times a day (TID) | ORAL | 2 refills | Status: AC | PRN

## 2020-01-17 MED ORDER — PREDNISONE 20 MG PO TABS: ORAL_TABLET | ORAL | 0 refills | Status: DC

## 2020-01-17 MED ORDER — TRAMADOL HCL 50 MG PO TABS: 50.0000 mg | ORAL_TABLET | Freq: Two times a day (BID) | ORAL | 0 refills | Status: DC | PRN

## 2020-01-17 NOTE — Progress Notes (Signed)
PROVIDER NOTE: Information contained herein reflects review and annotations entered in association with encounter. Interpretation of such information and data should be left to medically-trained personnel. Information provided to patient can be located elsewhere in the medical record under "Patient Instructions". Document created using STT-dictation technology, any transcriptional errors that may result from process are unintentional.    Patient: Kelly Rollins  Service Category: E/M  Provider: Gaspar Cola, MD  DOB: February 27, 1970  DOS: 01/17/2020  Specialty: Interventional Pain Management  MRN: [id number]  Setting: Ambulatory outpatient  PCP: [name], PA-C  Type: Established Patient    Referring Provider: [name],*  Location: Office  Delivery: Face-to-face     HPI  [name], a 49 y.o. year old female, is here today because of her Chronic pain syndrome [G89.4]. [name] primary complain today is Back Pain (lower) Last encounter: My last encounter with her was on Visit date not found. Pertinent problems: Kelly Rollins has Chronic low back pain (Primary Source of Pain) (Bilateral) (L>R); Lumbar facet syndrome (Bilateral) (L>R); Chronic knee pain [facility] source of pain) (Bilateral) (L>R); Lumbar spondylosis; Neuropathic pain; Neurogenic pain; Myofascial pain; Acute low back pain; Lumbar transverse process fracture (HCC) (Left L1) (03/11/15); Chronic pain syndrome; Chest pain with moderate risk of acute coronary syndrome; Chronic hip pain (Secondary source of pain) (Bilateral) (L>R); Spondylosis without myelopathy or radiculopathy, lumbosacral region; Chronic hip pain (Left); Acute hip pain (Left); DDD (degenerative disc disease), lumbosacral; Osteoarthritis involving multiple joints; Lumbar facet arthropathy (Multilevel) (Bilateral); Lumbosacral radiculopathy at S1 (Left); Lumbosacral radiculopathy (S1) (Left); and Chronic lower extremity pain (Bilateral)  (L>R) on their pertinent problem list. Pain Assessment: Severity of   is reported as a  /10. Location: Back Lower,Left/ . Onset: More than a month ago. Quality: Spasm,Aching,Sharp,Tingling (Pinches, a zapping feeling). Timing: Intermittent (Since fall in November it has gotten worse.). Modifying factor(s): rest. Vitals:  height is _0  (1.626 m) and weight is 180 lb (81.6 kg). Her temperature is 97.3 F (36.3 C) (abnormal). Her blood pressure is 150/93 (abnormal) and her pulse is 92. Her respiration is 16 and oxygen saturation is 99%.   Reason for encounter: worsening of previously known (established) problem the patient comes into the clinics today indicating that she still having the low back pain, but lately she has been experiencing a lot of pain, discomfort, and weakness in the area of the left lower extremity going all the way down into her foot.  She describes having leg cramps at night with twitches in both lower extremities and pain, especially on the left lower extremity that feels like a toothache.  She is having bilateral hip and knee pain since she contracted Covid.  She has had several falls and apparently she was referred to a neurologist by her primary care provider.  She indicated that she was being sent for nerve conduction test but she has not heard anything from the neurologist.  In terms of the lower extremity pain she indicates that this pain in the left lower extremity goes all the way down through the lateral aspect of the foot and what seems to be an S1 dermatomal distribution.  Physical exam today was positive bilaterally for straight leg raise with pain going down the right leg upon doing the right straight leg raise maneuver, down to the level of the knee.  In the case of the left lower extremity when she did this straight leg raise maneuver the pain when into the area of  the hip and all the way down into her left foot.  This is significant for meningeal symptoms suggesting the  possibility of a radiculopathy.  DTRs were present and equal bilaterally and the patient was able to toe walk and heel walk with some pain and she indicated that it felt unstable.  Cervical flexion does trigger pain in the area of the left hip.  According to my PMP review the patient has been getting hydrocodone from [facility], PA-C, her primary care provider.  Last prescription was filled on 01/02/2020 (hydrocodone/APAP 5/325 #15 (1 tab p.o. 3 times daily).  The patient's last procedure was a therapeutic right lumbar facet RFA #2 on 02/21/2019.  On 02/07/2019 she had the left side done.  She was last evaluated on 09/19/2019.  The time she was taking tizanidine (Zanaflex) 4 mg tablet 1 tablet p.o. every 8 hours and she was stable on the medication.  She was provided with refills to last until 12/18/2019.  At that time we had transfer her medication regimen to her PCP and we had advised the patient to contact her PCP for that refill.  At the time she also indicated being able to take the meloxicam due to GI side effects.  I also question her about the possibility of taking some Neurontin or Lyrica, but she indicated having tried them and having problems with them.  She also indicated not being interested in taking a whole lot of medications.  At the beginning of the year she had lumbar facet radiofrequency ablations with a rather uncomfortable postoperative period.  However it completely eliminated her lower extremity pain except for occasional hip and knee pains.  Today the patient requested some type of "pain medicine" to help her until something can be done about this lower extremity pain.  I saw where she had and "allergy" to tramadol, which basically consisted of some nausea and vomiting when she tried it in 2017.  However, it has been a long time since that and she thinks that it was due to the fact that she was not used to taking that kind of medicine.  She is okay to give it a try at taking  some of that to help her with the pain.  Today I provided her with a 1 week prescription to take the medicine up to 2 times a day.  It is not my intention to keep the patient on this medicine.  Today I have also sent a prescription to her pharmacy for a steroid taper and I have ordered x-rays of her knees, hips, lumbar spine on flexion and extension, and an MRI as it is clear to me that this is a problem that is intraspinal.  I have also scheduled the patient to come back for a caudal epidural steroid injection, in the event that the above does not help her.  I also placed a referral to the neurologist for an EMG/PNCV of the lower extremities.  To evaluate the muscle spasms, I have ordered some lab work today.  Some of these labs were available to me before closing this note.  Her set rate is within normal limits and her magnesium levels are also within normal limits.  Sodium and chloride are mildly decreased and her blood sugar is elevated in the 300+ range.  In view of this, I called her pharmacy and canceled her prednisone taper.  ([place] called and prednisone canceled-confirmed)  Pharmacotherapy Assessment   Analgesic: No opioid analgesic prescriptions from our  practice.  Hydrocodone/APAP 5/325, 1 tab PO q 8 hrs (15 mg/day of hydrocodone) (written by Lavonia Drafts. Whitaker, PA-C) #90 (on 10/31/2018) MME/day:72m/day.   Monitoring: [place] PMP: PDMP reviewed during this encounter.       Pharmacotherapy: No side-effects or adverse reactions reported. Compliance: No problems identified. Effectiveness: Clinically acceptable.  No notes on file  UDS:  Summary  Date Value Ref Range Status  01/18/2017 FINAL  Final    Comment:    ==================================================================== TOXASSURE SELECT 13 (MW) ==================================================================== Test                             Result       Flag       Units Drug Absent but Declared for Prescription  Verification   Oxycodone                      Not Detected UNEXPECTED ng/mg creat ==================================================================== Test                      Result    Flag   Units      Ref Range   Creatinine              209              mg/dL      >=20 ==================================================================== Declared Medications:  The flagging and interpretation on this report are based on the  following declared medications.  Unexpected results may arise from  inaccuracies in the declared medications.  **Note: The testing scope of this panel includes these medications:  Oxycodone  **Note: The testing scope of this panel does not include following  reported medications:  Acetaminophen  Albuterol  Aspirin  Caffeine  Gabapentin  Ibuprofen  Magnesium  Metformin  Metoprolol (Toprol)  Multivitamin (MVI)  Ondansetron (Zofran)  Venlafaxine (Effexor)  Vitamin C  Vitamin D3 ==================================================================== For clinical consultation, please call ([phone] ====================================================================      ROS  Constitutional: Denies any fever or chills Gastrointestinal: No reported hemesis, hematochezia, vomiting, or acute GI distress Musculoskeletal: Denies any acute onset joint swelling, redness, loss of ROM, or weakness Neurological: No reported episodes of acute onset apraxia, aphasia, dysarthria, agnosia, amnesia, paralysis, loss of coordination, or loss of consciousness  Medication Review  HYDROcodone-acetaminophen, amLODipine, carboxymethylcellulose, ibuprofen, insulin aspart protamine- aspart, metoprolol tartrate, omeprazole, rosuvastatin, tiZANidine, traMADol, and venlafaxine XR  History Review  Allergy: Kelly Rollins allergic to atorvastatin, levofloxacin, morphine, penicillins, and tramadol hcl. Drug: Kelly Rollins reports no history of drug use. Alcohol:  reports current  alcohol use. Tobacco:  reports that she quit smoking about 5 years ago. Her smoking use included cigarettes. She has never used smokeless tobacco. Social: Kelly Rollins reports that she quit smoking about 5 years ago. Her smoking use included cigarettes. She has never used smokeless tobacco. She reports current alcohol use. She reports that she does not use drugs. Medical:  has a past medical history of ABDOMINAL PAIN OTHER SPECIFIED SITE (02/21/2009), ABDOMINAL PAIN-LUQ (04/17/2009), Anal fissure, Asthma, Diabetes mellitus without complication ([place], HDQQIWLNL(892.1, HEMATOCHEZIA (04/17/2009), Hypertension, IBS (irritable bowel syndrome), Migraine headache (11/06/2008), OVARIAN CYST, RIGHT (02/21/2009), PALPITATIONS, OCCASIONAL (04/17/2010), and Pancreatitis (10/14/2017). Surgical: Ms. LBubeck has a past surgical history that includes Cesarean section; Vaginal hysterectomy; Cholecystectomy; Breast reduction surgery; Breast excisional biopsy (Left, 1997); and Reduction mammaplasty (Bilateral, 1997). Family: family history includes Cancer in her mother; Heart disease  in her father; Hyperlipidemia in her father and mother; Hypertension in her father and mother.  Laboratory Chemistry Profile   Renal Lab Results  Component Value Date   BUN 12 01/17/2020   CREATININE 0.66 01/17/2020   BCR 14 05/06/2017   GFR 88.95 03/19/2010   GFRAA >60 11/05/2017   GFRNONAA >60 01/17/2020     Hepatic Lab Results  Component Value Date   AST 25 01/17/2020   ALT 31 01/17/2020   ALBUMIN 4.1 01/17/2020   ALKPHOS 109 01/17/2020   AMYLASE 40 04/03/2010   LIPASE 27 11/05/2017     Electrolytes Lab Results  Component Value Date   NA 132 (L) 01/17/2020   K 4.4 01/17/2020   CL 95 (L) 01/17/2020   CALCIUM 9.8 01/17/2020   MG 2.1 01/17/2020   PHOS 4.4 10/20/2017     Bone Lab Results  Component Value Date   25OHVITD1 23 (L) 11/16/2018   25OHVITD2 4.0 11/16/2018   25OHVITD3 19 11/16/2018     Inflammation (CRP:  Acute Phase) (ESR: Chronic Phase) Lab Results  Component Value Date   CRP 2 11/16/2018   ESRSEDRATE 17 01/17/2020   LATICACIDVEN 2.0 ([place]) 02/03/2017       Note: Above Lab results reviewed.  Recent Imaging Review  US Abdomen Complete CLINICAL DATA:  Elevated liver function tests.  EXAM: ABDOMEN ULTRASOUND COMPLETE  COMPARISON:  October 20, 2017.  FINDINGS: Gallbladder: Status post cholecystectomy.  Common bile duct: Diameter: 4 mm which is within normal limits.  Liver: No focal lesion identified. Increased echogenicity of hepatic parenchyma is noted consistent with hepatic steatosis or other diffuse hepatocellular disease. Portal vein is patent on color Doppler imaging with normal direction of blood flow towards the liver.  IVC: No abnormality visualized.  Pancreas: Visualized portion unremarkable.  Spleen: Size and appearance within normal limits.  Right Kidney: Length: 11 cm. Echogenicity within normal limits. No mass or hydronephrosis visualized.  Left Kidney: Length: 11.6 cm. Echogenicity within normal limits. No mass or hydronephrosis visualized.  Abdominal aorta: No aneurysm visualized.  Other findings: None.  IMPRESSION: Status post cholecystectomy. Increased echogenicity of hepatic parenchyma is noted consistent with hepatic steatosis or other diffuse hepatocellular disease.  Electronically Signed   By: Marijo Conception M.D.   On: 06/13/2019 15:53 Note: Reviewed        Physical Exam  General appearance: Well nourished, well developed, and well hydrated. In no apparent acute distress Mental status: Alert, oriented x 3 (person, place, & time)       Respiratory: No evidence of acute respiratory distress Eyes: PERLA Vitals: BP (!) 150/93   Pulse 92   Temp (!) 97.3 F (36.3 C)   Resp 16   Ht _0  (1.626 m)   Wt 180 lb (81.6 kg)   SpO2 99%   BMI 30.90 kg/m  BMI: Estimated body mass index is 30.9 kg/m as calculated from the following:    Height as of this encounter: _1  (1.626 m).   Weight as of this encounter: 180 lb (81.6 kg). Ideal: Ideal body weight: 54.7 kg (120 lb 9.5 oz) Adjusted ideal body weight: 65.5 kg (144 lb 5.7 oz)  Assessment   Status Diagnosis  Controlled Controlled Controlled 1. Chronic pain syndrome   2. Chronic low back pain (Primary Source of Pain) (Bilateral) (L>R)   3. Chronic hip pain (Secondary source of pain) (Bilateral) (L>R)   4. Chronic knee pain [facility] source of pain) (Bilateral) (L>R)   5. Pharmacologic therapy  6. Lumbosacral radiculopathy at S1 (Right)   7. Lumbosacral radiculopathy (S1) (Left)   8. Other intervertebral disc degeneration, lumbar region   9. Chronic lower extremity pain (Bilateral) (L>R)   10. Disorder of skeletal system   11. Problems influencing health status   12. Vitamin D insufficiency   13. Neurogenic pain   14. Neuropathic pain      Updated Problems: Problem  Lumbosacral radiculopathy at S1 (Left)  Lumbosacral radiculopathy (S1) (Left)  Chronic lower extremity pain (Bilateral) (L>R)    Plan of Care  Problem-specific:  No problem-specific Assessment & Plan notes found for this encounter.  Kelly Rollins has a current medication list which includes the following long-term medication(s): amlodipine, metoprolol tartrate, novolog mix 70/30, omeprazole, venlafaxine xr, tizanidine, and tramadol.  Pharmacotherapy (Medications Ordered): Meds ordered this encounter  Medications  . tiZANidine (ZANAFLEX) 4 MG tablet    Sig: Take 1 tablet (4 mg total) by mouth every 8 (eight) hours as needed for muscle spasms.    Dispense:  90 tablet    Refill:  2    Do not place this medication, or any other prescription from our practice, on "Automatic Refill". Patient may have prescription filled one day early if pharmacy is closed on scheduled refill date.  . traMADol (ULTRAM) 50 MG tablet    Sig: Take 1 tablet (50 mg total) by mouth every 12 (twelve) hours as  needed for up to 7 days for severe pain. Must last 7 days    Dispense:  14 tablet    Refill:  0  . DISCONTD: predniSONE (DELTASONE) 20 MG tablet    Sig: Take 3 tablets (60 mg total) by mouth daily with breakfast for 3 days, THEN 2 tablets (40 mg total) daily with breakfast for 3 days, THEN 1 tablet (20 mg total) daily with breakfast for 3 days.    Dispense:  18 tablet    Refill:  0   Orders:  Orders Placed This Encounter  Procedures  . Caudal Epidural Injection    Standing Status:   Future    Standing Expiration Date:   02/17/2020    Scheduling Instructions:     Laterality: Left-sided     Level(s): Sacrococcygeal canal (Tailbone area)     Sedation: Patient's choice     Scheduling Timeframe: As soon as pre-approved    Order Specific Question:   Where will this procedure be performed?    Answer:   [facility]  . MR LUMBAR SPINE WO CONTRAST    Patient presents with axial pain with possible radicular component.  In addition to any acute findings, please report on:  1. Facet (Zygapophyseal) joint DJD (Hypertrophy, space narrowing, subchondral sclerosis, and/or osteophyte formation) 2. DDD and/or IVDD (Loss of disc height, desiccation or "Black disc disease") 3. Pars defects 4. Spondylolisthesis, spondylosis, and/or spondyloarthropathies (include Degree/Grade of displacement in mm) 5. Vertebral body Fractures, including age (old, new/acute) 42. Modic Type Changes 7. Demineralization 8. Bone pathology 9. Central, Lateral Recess, and/or Foraminal Stenosis (include AP diameter of stenosis in mm) 10. Surgical changes (hardware type, status, and presence of fibrosis)  NOTE: Please specify level(s) and laterality.    Standing Status:   Future    Standing Expiration Date:   02/17/2020    Scheduling Instructions:     Imaging must be done as soon as possible. Inform patient that order will expire within 30 days and I will not renew it.    Order Specific Question:  What is the  patient's sedation requirement?    Answer:   No Sedation    Order Specific Question:   Does the patient have a pacemaker or implanted devices?    Answer:   No    Order Specific Question:   Preferred imaging location?    Answer:   [facility] (table limit-350lbs)    Order Specific Question:   Call Results- Best Contact Number?    Answer:   [phone] ([facility])    Order Specific Question:   Radiology Contrast Protocol - do NOT remove file path    Answer:   \\[email or web address]\[email or web address].PDF  . DG Lumbar Spine Complete W/Bend    Patient presents with axial pain with possible radicular component.  In addition to any acute findings, please report on:  1. Facet (Zygapophyseal) joint DJD (Hypertrophy, space narrowing, subchondral sclerosis, and/or osteophyte formation) 2. DDD and/or IVDD (Loss of disc height, desiccation or "Black disc disease") 3. Pars defects 4. Spondylolisthesis, spondylosis, and/or spondyloarthropathies (include Degree/Grade of displacement in mm) 5. Vertebral body Fractures, including age (old, new/acute) 31. Modic Type Changes 7. Demineralization 8. Bone pathology 9. Central, Lateral Recess, and/or Foraminal Stenosis (include AP diameter of stenosis in mm) 10. Surgical changes (hardware type, status, and presence of fibrosis)  NOTE: Please specify level(s) and laterality. If applicable: Please indicate ROM and/or evidence of instability (>12m displacement between flexion and extension views)    Standing Status:   Future    Number of Occurrences:   1    Standing Expiration Date:   02/17/2020    Scheduling Instructions:     Imaging must be done as soon as possible. Inform patient that order will expire within 30 days and I will not renew it.    Order Specific Question:   Reason for Exam (SYMPTOM  OR DIAGNOSIS REQUIRED)    Answer:   Low back pain    Order Specific Question:   Is patient pregnant?    Answer:   No    Order Specific  Question:   Preferred imaging location?    Answer:   [facility]    Order Specific Question:   Call Results- Best Contact Number?    Answer:   [phone][facility]    Order Specific Question:   Radiology Contrast Protocol - do NOT remove file path    Answer:   \\[email or web address]    Order Specific Question:   Release to patient    Answer:   Immediate  . DG HIP UNILAT W OR W/O PELVIS 2-3 VIEWS RIGHT    Please describe any evidence of DJD, such as joint narrowing, asymmetry, cysts, or any anomalies in bone density, production, or erosion.    Standing Status:   Future    Number of Occurrences:   1    Standing Expiration Date:   02/17/2020    Scheduling Instructions:     Imaging must be done as soon as possible. Inform patient that order will expire within 30 days and I will not renew it.    Order Specific Question:   Reason for Exam (SYMPTOM  OR DIAGNOSIS REQUIRED)    Answer:   Right hip pain/arthralgia    Order Specific Question:   Is the patient pregnant?    Answer:   No    Order Specific Question:   Preferred imaging location?    Answer:   [facility]    Order Specific Question:   Call Results- Best Contact Number?  Answer:   [phone] ([facility])    Order Specific Question:   Release to patient    Answer:   Immediate  . DG HIP UNILAT W OR W/O PELVIS 2-3 VIEWS LEFT    Please describe any evidence of DJD, such as joint narrowing, asymmetry, cysts, or any anomalies in bone density, production, or erosion.    Standing Status:   Future    Number of Occurrences:   1    Standing Expiration Date:   02/17/2020    Scheduling Instructions:     Imaging must be done as soon as possible. Inform patient that order will expire within 30 days and I will not renew it.    Order Specific Question:   Reason for Exam (SYMPTOM  OR DIAGNOSIS REQUIRED)    Answer:   Right hip pain/arthralgia    Order Specific Question:   Is the  patient pregnant?    Answer:   No    Order Specific Question:   Preferred imaging location?    Answer:   [facility]    Order Specific Question:   Call Results- Best Contact Number?    Answer:   [phone] [facility])  . DG Knee 1-2 Views Right    Standing Status:   Future    Number of Occurrences:   1    Standing Expiration Date:   02/17/2020    Scheduling Instructions:     Imaging must be done as soon as possible. Inform patient that order will expire within 30 days and I will not renew it.    Order Specific Question:   Reason for Exam (SYMPTOM  OR DIAGNOSIS REQUIRED)    Answer:   Right hip pain/arthralgia    Order Specific Question:   Is the patient pregnant?    Answer:   No    Order Specific Question:   Preferred imaging location?    Answer:   [facility]    Order Specific Question:   Call Results- Best Contact Number?    Answer:   [phone] ([facility])    Order Specific Question:   Release to patient    Answer:   Immediate  . DG Knee 1-2 Views Left    Standing Status:   Future    Number of Occurrences:   1    Standing Expiration Date:   02/17/2020    Scheduling Instructions:     Imaging must be done as soon as possible. Inform patient that order will expire within 30 days and I will not renew it.    Order Specific Question:   Reason for Exam (SYMPTOM  OR DIAGNOSIS REQUIRED)    Answer:   Right hip pain/arthralgia    Order Specific Question:   Is the patient pregnant?    Answer:   No    Order Specific Question:   Preferred imaging location?    Answer[facility]    [facility]    Order Specific Question:   Call Results- Best Contact Number?    Answer:   [phone] ([facility])    Order Specific Question:   Release to patient    Answer:   Immediate  . Comp. Metabolic Panel (12)    With GFR. Indications: Chronic Pain Syndrome (G89.4) & Pharmacotherapy (T62.263)    Order Specific Question:   Has the patient fasted?    Answer:   No     Order Specific Question:   CC Results    Answer:   PCP-NURSE [  [id number]]    Order Specific Question:   Release to patient    Answer:   Immediate  . Magnesium    Indication: Pharmacologic therapy (Z79.899)    Order Specific Question:   CC Results    Answer:   PCP-NURSE [[id number]]    Order Specific Question:   Release to patient    Answer:   Immediate  . Vitamin B12    Indication: Pharmacologic therapy (B51.025).    Order Specific Question:   CC Results    Answer:   ENI-DPOEU [[id number]]    Order Specific Question:   Release to patient    Answer:   Immediate  . Sedimentation rate    Indication: Disorder of skeletal system (M89.9)    Order Specific Question:   CC Results    Answer:   PCP-NURSE [[id number]]    Order Specific Question:   Release to patient    Answer:   Immediate  . 25-Hydroxy vitamin D Lcms D2+D3    Indication: Disorder of skeletal system (M89.9).    Order Specific Question:   CC Results    Answer:   [id number] [[id number]]    Order Specific Question:   Release to patient    Answer:   Immediate  . C-reactive protein    Indication: Problems influencing health status (Z78.9)    Order Specific Question:   CC Results    Answer:   PCP-NURSE [[id number]]    Order Specific Question:   Release to patient    Answer:   Immediate  . Vitamin B1    Order Specific Question:   Release to patient    Answer:   Immediate  . NCV with EMG(electromyography)    Bilateral testing requested.    Standing Status:   Future    Standing Expiration Date:   03/19/2020    Scheduling Instructions:     Please refer this patient to [facility] for Nerve Conduction testing of the lower extremities. (EMG & PNCV)    Order Specific Question:   Where should this test be performed?    Answer:   other    Order Specific Question:   Release to patient    Answer:   Immediate   Follow-up plan:   Return for Procedure (w/ sedation): (L) Caudal ESI #1.      Interventional treatment options: Planned,  scheduled, and/or pending:   Palliative left-sided lumbar facet RFA #2 under fluoroscopic guidance and IV sedation   Under consideration:   Diagnostic bilateral IA hip joint injection  Diagnostic left IA hip joint injection    Therapeutic/palliative (PRN):   Palliative bilateral lumbar facet block #4  Palliative left L1 transverse process injection #2  Palliative/Therapeutic righ lumbar facet RFA #2 (last done 06/23/2016) Palliative/Therapeutic left lumbar facet RFA #2 (last done 02/07/2019)    Recent Visits No visits were found meeting these conditions. Showing recent visits within past 90 days and meeting all other requirements Today's Visits Date Type Provider Dept  01/17/20 Office Visit Milinda Pointer, MD [facility]  Showing today's visits and meeting all other requirements Future Appointments Date Type Provider Dept  01/25/20 Appointment Milinda Pointer, MD [facility]  Showing future appointments within next 90 days and meeting all other requirements  I discussed the assessment and treatment plan with the patient. The patient was provided an opportunity to ask questions and all were answered. The patient agreed with the plan and demonstrated an understanding of the instructions.  Patient advised to call back  or seek an in-person evaluation if the symptoms or condition worsens.  Duration of encounter: 48 minutes.  Note by: Gaspar Cola, MD Date: 01/17/2020; Time: 1:14 PM

## 2020-01-17 NOTE — Patient Instructions (Addendum)
____________________________________________________________________________________________  Preparing for Procedure with Sedation  Procedure appointments are limited to planned procedures: . No Prescription Refills. . No disability issues will be discussed. . No medication changes will be discussed.  Instructions: . Oral Intake: Do not eat or drink anything for at least 8 hours prior to your procedure. (Exception: Blood Pressure Medication. See below.) . Transportation: Unless otherwise stated by your physician, you may drive yourself after the procedure. . Blood Pressure Medicine: Do not forget to take your blood pressure medicine with a sip of water the morning of the procedure. If your Diastolic (lower reading)is above 100 mmHg, elective cases will be cancelled/rescheduled. . Blood thinners: These will need to be stopped for procedures. Notify our staff if you are taking any blood thinners. Depending on which one you take, there will be specific instructions on how and when to stop it. . Diabetics on insulin: Notify the staff so that you can be scheduled 1st case in the morning. If your diabetes requires high dose insulin, take only  of your normal insulin dose the morning of the procedure and notify the staff that you have done so. . Preventing infections: Shower with an antibacterial soap the morning of your procedure. . Build-up your immune system: Take 1000 mg of Vitamin C with every meal (3 times a day) the day prior to your procedure. . Antibiotics: Inform the staff if you have a condition or reason that requires you to take antibiotics before dental procedures. . Pregnancy: If you are pregnant, call and cancel the procedure. . Sickness: If you have a cold, fever, or any active infections, call and cancel the procedure. . Arrival: You must be in the facility at least 30 minutes prior to your scheduled procedure. . Children: Do not bring children with you. . Dress appropriately:  Bring dark clothing that you would not mind if they get stained. . Valuables: Do not bring any jewelry or valuables.  Reasons to call and reschedule or cancel your procedure: (Following these recommendations will minimize the risk of a serious complication.) . Surgeries: Avoid having procedures within 2 weeks of any surgery. (Avoid for 2 weeks before or after any surgery). . Flu Shots: Avoid having procedures within 2 weeks of a flu shots or . (Avoid for 2 weeks before or after immunizations). . Barium: Avoid having a procedure within 7-10 days after having had a radiological study involving the use of radiological contrast. (Myelograms, Barium swallow or enema study). . Heart attacks: Avoid any elective procedures or surgeries for the initial 6 months after a "Myocardial Infarction" (Heart Attack). . Blood thinners: It is imperative that you stop these medications before procedures. Let us know if you if you take any blood thinner.  . Infection: Avoid procedures during or within two weeks of an infection (including chest colds or gastrointestinal problems). Symptoms associated with infections include: Localized redness, fever, chills, night sweats or profuse sweating, burning sensation when voiding, cough, congestion, stuffiness, runny nose, sore throat, diarrhea, nausea, vomiting, cold or Flu symptoms, recent or current infections. It is specially important if the infection is over the area that we intend to treat. . Heart and lung problems: Symptoms that may suggest an active cardiopulmonary problem include: cough, chest pain, breathing difficulties or shortness of breath, dizziness, ankle swelling, uncontrolled high or unusually low blood pressure, and/or palpitations. If you are experiencing any of these symptoms, cancel your procedure and contact your primary care physician for an evaluation.  Remember:  Regular Business hours are:    Monday to Thursday 8:00 AM to 4:00 PM  Provider's  Schedule: Brode Sculley, MD:  Procedure days: Tuesday and Thursday 7:30 AM to 4:00 PM  Bilal Lateef, MD:  Procedure days: Monday and Wednesday 7:30 AM to 4:00 PM ____________________________________________________________________________________________   ____________________________________________________________________________________________  General Risks and Possible Complications  Patient Responsibilities: It is important that you read this as it is part of your informed consent. It is our duty to inform you of the risks and possible complications associated with treatments offered to you. It is your responsibility as a patient to read this and to ask questions about anything that is not clear or that you believe was not covered in this document.  Patient's Rights: You have the right to refuse treatment. You also have the right to change your mind, even after initially having agreed to have the treatment done. However, under this last option, if you wait until the last second to change your mind, you may be charged for the materials used up to that point.  Introduction: Medicine is not an exact science. Everything in Medicine, including the lack of treatment(s), carries the potential for danger, harm, or loss (which is by definition: Risk). In Medicine, a complication is a secondary problem, condition, or disease that can aggravate an already existing one. All treatments carry the risk of possible complications. The fact that a side effects or complications occurs, does not imply that the treatment was conducted incorrectly. It must be clearly understood that these can happen even when everything is done following the highest safety standards.  No treatment: You can choose not to proceed with the proposed treatment alternative. The "PRO(s)" would include: avoiding the risk of complications associated with the therapy. The "CON(s)" would include: not getting any of the treatment  benefits. These benefits fall under one of three categories: diagnostic; therapeutic; and/or palliative. Diagnostic benefits include: getting information which can ultimately lead to improvement of the disease or symptom(s). Therapeutic benefits are those associated with the successful treatment of the disease. Finally, palliative benefits are those related to the decrease of the primary symptoms, without necessarily curing the condition (example: decreasing the pain from a flare-up of a chronic condition, such as incurable terminal cancer).  General Risks and Complications: These are associated to most interventional treatments. They can occur alone, or in combination. They fall under one of the following six (6) categories: no benefit or worsening of symptoms; bleeding; infection; nerve damage; allergic reactions; and/or death. 1. No benefits or worsening of symptoms: In Medicine there are no guarantees, only probabilities. No healthcare provider can ever guarantee that a medical treatment will work, they can only state the probability that it may. Furthermore, there is always the possibility that the condition may worsen, either directly, or indirectly, as a consequence of the treatment. 2. Bleeding: This is more common if the patient is taking a blood thinner, either prescription or over the counter (example: Goody Powders, Fish oil, Aspirin, Garlic, etc.), or if suffering a condition associated with impaired coagulation (example: Hemophilia, cirrhosis of the liver, low platelet counts, etc.). However, even if you do not have one on these, it can still happen. If you have any of these conditions, or take one of these drugs, make sure to notify your treating physician. 3. Infection: This is more common in patients with a compromised immune system, either due to disease (example: diabetes, cancer, human immunodeficiency virus [HIV], etc.), or due to medications or treatments (example: therapies used to treat  cancer and   rheumatological diseases). However, even if you do not have one on these, it can still happen. If you have any of these conditions, or take one of these drugs, make sure to notify your treating physician. 4. Nerve Damage: This is more common when the treatment is an invasive one, but it can also happen with the use of medications, such as those used in the treatment of cancer. The damage can occur to small secondary nerves, or to large primary ones, such as those in the spinal cord and brain. This damage may be temporary or permanent and it may lead to impairments that can range from temporary numbness to permanent paralysis and/or brain death. 5. Allergic Reactions: Any time a substance or material comes in contact with our body, there is the possibility of an allergic reaction. These can range from a mild skin rash (contact dermatitis) to a severe systemic reaction (anaphylactic reaction), which can result in death. 6. Death: In general, any medical intervention can result in death, most of the time due to an unforeseen complication. ____________________________________________________________________________________________  ____________________________________________________________________________________________  Muscle Spasms & Cramps  Cause:  The most common cause of muscle spasms and cramps is vitamin and/or electrolyte (calcium, potassium, sodium, etc.) deficiencies.  Possible triggers: Sweating - causes loss of electrolytes thru the skin. Steroids - causes loss of electrolytes thru the urine.  Treatment: 1. Gatorade (or any other electrolyte-replenishing drink) - Take 1, 8 oz glass with each meal (3 times a day). 2. OTC (over-the-counter) Magnesium 400 to 500 mg - Take 1 tablet twice a day (one with breakfast and one before bedtime). If you have kidney problems, talk to your primary care physician before taking any Magnesium. 3. Tonic Water with quinine - Take 1, 8 oz glass  before bedtime.   ____________________________________________________________________________________________   Preparing for Procedure with Sedation Instructions: . Oral Intake: Do not eat or drink anything for at least 8 hours prior to your procedure. . Transportation: Public transportation is not allowed. Bring an adult driver. The driver must be physically present in our waiting room before any procedure can be started. Marland Kitchen Physical Assistance: Bring an adult capable of physically assisting you, in the event you need help. . Blood Pressure Medicine: Take your blood pressure medicine with a sip of water the morning of the procedure. . Insulin: Take only  of your normal insulin dose. . Preventing infections: Shower with an antibacterial soap the morning of your procedure. . Build-up your immune system: Take 1000 mg of Vitamin C with every meal (3 times a day) the day prior to your procedure. . Pregnancy: If you are pregnant, call and cancel the procedure. . Sickness: If you have a cold, fever, or any active infections, call and cancel the procedure. . Arrival: You must be in the facility at least 30 minutes prior to your scheduled procedure. . Children: Do not bring children with you. . Dress appropriately: Bring dark clothing that you would not mind if they get stained. . Valuables: Do not bring any jewelry or valuables. Procedure appointments are reserved for interventional treatments only. Marland Kitchen No Prescription Refills. . No medication changes will be discussed during procedure appointments. . No disability issues will be discussed. Epidural Steroid Injection Patient Information  Description: The epidural space surrounds the nerves as they exit the spinal cord.  In some patients, the nerves can be compressed and inflamed by a bulging disc or a tight spinal canal (spinal stenosis).  By injecting steroids into the epidural space, we can  bring irritated nerves into direct contact with a  potentially helpful medication.  These steroids act directly on the irritated nerves and can reduce swelling and inflammation which often leads to decreased pain.  Epidural steroids may be injected anywhere along the spine and from the neck to the low back depending upon the location of your pain.   After numbing the skin with local anesthetic (like Novocaine), a small needle is passed into the epidural space slowly.  You may experience a sensation of pressure while this is being done.  The entire block usually last less than 10 minutes.  Conditions which may be treated by epidural steroids:  Low back and leg pain Neck and arm pain Spinal stenosis Post-laminectomy syndrome Herpes zoster (shingles) pain Pain from compression fractures  Preparation for the injection:  Do not eat any solid food or dairy products within 8 hours of your appointment.  You may drink clear liquids up to 3 hours before appointment.  Clear liquids include water, black coffee, juice or soda.  No milk or cream please. You may take your regular medication, including pain medications, with a sip of water before your appointment  Diabetics should hold regular insulin (if taken separately) and take 1/2 normal NPH dos the morning of the procedure.  Carry some sugar containing items with you to your appointment. A driver must accompany you and be prepared to drive you home after your procedure.  Bring all your current medications with your. An IV may be inserted and sedation may be given at the discretion of the physician.   A blood pressure cuff, EKG and other monitors will often be applied during the procedure.  Some patients may need to have extra oxygen administered for a short period. You will be asked to provide medical information, including your allergies, prior to the procedure.  We must know immediately if you are taking blood thinners (like Coumadin/Warfarin)  Or if you are allergic to IV iodine contrast (dye). We must  know if you could possible be pregnant.  Possible side-effects: Bleeding from needle site Infection (rare, may require surgery) Nerve injury (rare) Numbness & tingling (temporary) Difficulty urinating (rare, temporary) Spinal headache ( a headache worse with upright posture) Light -headedness (temporary) Pain at injection site (several days) Decreased blood pressure (temporary) Weakness in arm/leg (temporary) Pressure sensation in back/neck (temporary)  Call if you experience: Fever/chills associated with headache or increased back/neck pain. Headache worsened by an upright position. New onset weakness or numbness of an extremity below the injection site Hives or difficulty breathing (go to the emergency room) Inflammation or drainage at the infection site Severe back/neck pain Any new symptoms which are concerning to you  Please note:  Although the local anesthetic injected can often make your back or neck feel good for several hours after the injection, the pain will likely return.  It takes 3-7 days for steroids to work in the epidural space.  You may not notice any pain relief for at least that one week.  If effective, we will often do a series of three injections spaced 3-6 weeks apart to maximally decrease your pain.  After the initial series, we generally will wait several months before considering a repeat injection of the same type.  If you have any questions, please call [phone] [facility]

## 2020-01-21 LAB — VITAMIN B1: Vitamin B1 (Thiamine): 163.6 nmol/L (ref 66.5–200.0)

## 2020-01-22 LAB — 25-HYDROXY VITAMIN D LCMS D2+D3
25-Hydroxy, Vitamin D-2: 3.5 ng/mL
25-Hydroxy, Vitamin D-3: 23 ng/mL
25-Hydroxy, Vitamin D: 27 ng/mL — ABNORMAL LOW

## 2020-01-24 ENCOUNTER — Telehealth: Payer: Self-pay | Admitting: Pain Medicine

## 2020-01-24 NOTE — Telephone Encounter (Addendum)
Patient is doing well with the Tramadol and needs refill sent in . Also she will need something to help her get thru the MRI scheduled for 01-29-20

## 2020-01-24 NOTE — Telephone Encounter (Signed)
Added to 01-25-20 2pm

## 2020-01-24 NOTE — Telephone Encounter (Signed)
schedule a virtual appointment for this afternoon with Dr Dossie Arbour please.

## 2020-01-25 ENCOUNTER — Ambulatory Visit: Payer: Medicare Other | Attending: Pain Medicine | Admitting: Pain Medicine

## 2020-01-25 ENCOUNTER — Telehealth: Payer: Self-pay

## 2020-01-25 ENCOUNTER — Ambulatory Visit: Payer: Medicare Other | Admitting: Pain Medicine

## 2020-01-25 ENCOUNTER — Other Ambulatory Visit: Payer: Self-pay

## 2020-01-25 DIAGNOSIS — F419 Anxiety disorder, unspecified: Secondary | ICD-10-CM | POA: Diagnosis not present

## 2020-01-25 DIAGNOSIS — G894 Chronic pain syndrome: Secondary | ICD-10-CM

## 2020-01-25 DIAGNOSIS — M76892 Other specified enthesopathies of left lower limb, excluding foot: Secondary | ICD-10-CM

## 2020-01-25 DIAGNOSIS — M76891 Other specified enthesopathies of right lower limb, excluding foot: Secondary | ICD-10-CM | POA: Diagnosis not present

## 2020-01-25 DIAGNOSIS — F40240 Claustrophobia: Secondary | ICD-10-CM | POA: Diagnosis not present

## 2020-01-25 DIAGNOSIS — M47816 Spondylosis without myelopathy or radiculopathy, lumbar region: Secondary | ICD-10-CM

## 2020-01-25 MED ORDER — TRAMADOL HCL 50 MG PO TABS: 50.0000 mg | ORAL_TABLET | Freq: Two times a day (BID) | ORAL | 0 refills | Status: DC | PRN

## 2020-01-25 MED ORDER — DIAZEPAM 5 MG PO TABS: 5.0000 mg | ORAL_TABLET | ORAL | 0 refills | Status: DC | PRN

## 2020-01-25 NOTE — Progress Notes (Signed)
Patient: Kelly Rollins  Service Category: E/M  Provider: Gaspar Cola, MD  DOB: 1971/01/09  DOS: 01/25/2020  Location: Office  MRN: [id number]  Setting: Ambulatory outpatient  Referring Provider: [name],*  Type: Established Patient  Specialty: Interventional Pain Management  PCP: [name], PA-C  Location: Remote location  Delivery: TeleHealth     Virtual Encounter - Pain Management PROVIDER NOTE: Information contained herein reflects review and annotations entered in association with encounter. Interpretation of such information and data should be left to medically-trained personnel. Information provided to patient can be located elsewhere in the medical record under "Patient Instructions". Document created using STT-dictation technology, any transcriptional errors that may result from process are unintentional.    Contact & Pharmacy Preferred: [phone] Home: [phone] (home) Mobile: [phone] (mobile) E-mail: [email or web address], [place] - [place] [address] Phone: [phone] Fax: [phone]   Pre-screening  Kelly Rollins offered "in-person" vs "virtual" encounter. She indicated preferring virtual for this encounter.   Reason COVID-19*  Social distancing based on CDC and AMA recommendations.   I contacted Kelly Rollins on 01/25/2020 via telephone.      I clearly identified myself as Gaspar Cola, MD. I verified that I was speaking with the correct person using two identifiers (Name: Kelly Rollins, and date of birth: May 03, 1970).  Consent I sought verbal advanced consent from Kelly Rollins for virtual visit interactions. I informed Kelly Rollins of possible security and privacy concerns, risks, and limitations associated with providing "not-in-person" medical evaluation and management services. I also informed Kelly Rollins of the availability of  "in-person" appointments. Finally, I informed her that there would be a charge for the virtual visit and that she could be  personally, fully or partially, financially responsible for it. Kelly Rollins expressed understanding and agreed to proceed.   Historic Elements   Kelly Rollins is a 49 y.o. year old, female patient evaluated today after our last contact on 01/24/2020. Kelly Rollins  has a past medical history of ABDOMINAL PAIN OTHER SPECIFIED SITE (02/21/2009), ABDOMINAL PAIN-LUQ (04/17/2009), Anal fissure, Asthma, Diabetes mellitus without complication ([place]), ACZYSAYT(016.0), HEMATOCHEZIA (04/17/2009), Hypertension, IBS (irritable bowel syndrome), Migraine headache (11/06/2008), OVARIAN CYST, RIGHT (02/21/2009), PALPITATIONS, OCCASIONAL (04/17/2010), and Pancreatitis (10/14/2017). She also  has a past surgical history that includes Cesarean section; Vaginal hysterectomy; Cholecystectomy; Breast reduction surgery; Breast excisional biopsy (Left, 1997); and Reduction mammaplasty (Bilateral, 1997). Kelly Rollins has a current medication list which includes the following prescription(s): amlodipine, artificial tears, diazepam, hydrocodone-acetaminophen, ibuprofen, metoprolol tartrate, novolog mix 70/30, omeprazole, rosuvastatin, tizanidine, tramadol, and venlafaxine xr. She  reports that she quit smoking about 5 years ago. Her smoking use included cigarettes. She has never used smokeless tobacco. She reports current alcohol use. She reports that she does not use drugs. Kelly Rollins is allergic to atorvastatin, levofloxacin, morphine, and penicillins.   HPI  Today, she is being contacted for follow-up evaluation.  The patient indicated being surprised that the tramadol at 1 tablet p.o. twice daily has not caused her to have any major problems as it seems to have done in the past.  She was able to tolerate this without any issues.  In fact, she asked if we could increase it to 3 times daily.  I told the patient  that the fact that she is feeling better is likely to be dose dependent and therefore increasing it would likely begin to gave her side effects  and problems with it.  I also took the time to remind the patient that this prescription was just temporary since I did not really want to have her taking any narcotics of any kind until we could find out what is going on and whether or not those narcotics would be justified.  Today I was very clear about the fact that she had the MRI and the nerve conduction test come back negative, it is very likely that we would be stopping the tramadol.  She understood and accepted.  She also indicated having rescheduled the left-sided caudal ESI to be done after Christmas since she feels that having the procedure done during the holidays would be counterproductive to her Christmas plans.  If she continues with the pain will need to reschedule this as soon as possible.  EMG/PNCV status: According to the patient she has been going back and forth with the [facility] to schedule this.  I have encouraged her to get this scheduled on done before we see her again.  She indicated that she would be calling them today.  MRI status: She already has the MRI scheduled.  However, she indicated having problems with anxiety and claustrophobia and wanted to see if we could give her something for that.  Today I will go ahead and sent to her pharmacy to Valium pills to assist her with that.  I took the time to remind her that she needed to have a driver because I did not want her driving around after having taken the Valium.  She indicated that she would have her daughter drive her.  RTCB: s/p MRI & NCT.  Tramadol: informed to have limited duration of therapy.  Pharmacotherapy Assessment  Analgesic: No opioid analgesic prescriptions from our practice.  Hydrocodone/APAP 5/325, 1 tab PO q 8 hrs (15 mg/day of hydrocodone) (written by Lavonia Drafts. Whitaker, PA-C) #90 (on  10/31/2018) MME/day:54m/day.   Monitoring:  PMP: PDMP reviewed during this encounter.       Pharmacotherapy: No side-effects or adverse reactions reported. Compliance: No problems identified. Effectiveness: Clinically acceptable. Plan: Refer to "POC".  UDS:  Summary  Date Value Ref Range Status  01/18/2017 FINAL  Final    Comment:    ==================================================================== TOXASSURE SELECT 13 (MW) ==================================================================== Test                             Result       Flag       Units Drug Absent but Declared for Prescription Verification   Oxycodone                      Not Detected UNEXPECTED ng/mg creat ==================================================================== Test                      Result    Flag   Units      Ref Range   Creatinine              209              mg/dL      >=20 ==================================================================== Declared Medications:  The flagging and interpretation on this report are based on the  following declared medications.  Unexpected results may arise from  inaccuracies in the declared medications.  **Note: The testing scope of this panel includes these medications:  Oxycodone  **Note: The testing scope of this panel does not include following  reported medications:  Acetaminophen  Albuterol  Aspirin  Caffeine  Gabapentin  Ibuprofen  Magnesium  Metformin  Metoprolol (Toprol)  Multivitamin (MVI)  Ondansetron (Zofran)  Venlafaxine (Effexor)  Vitamin C  Vitamin D3 ==================================================================== For clinical consultation, please call [phone]. ====================================================================     Laboratory Chemistry Profile   Renal Lab Results  Component Value Date   BUN 12 01/17/2020   CREATININE 0.66 01/17/2020   BCR 14 05/06/2017   GFR 88.95 03/19/2010   GFRAA  >60 11/05/2017   GFRNONAA >60 01/17/2020     Hepatic Lab Results  Component Value Date   AST 25 01/17/2020   ALT 31 01/17/2020   ALBUMIN 4.1 01/17/2020   ALKPHOS 109 01/17/2020   AMYLASE 40 04/03/2010   LIPASE 27 11/05/2017     Electrolytes Lab Results  Component Value Date   NA 132 (L) 01/17/2020   K 4.4 01/17/2020   CL 95 (L) 01/17/2020   CALCIUM 9.8 01/17/2020   MG 2.1 01/17/2020   PHOS 4.4 10/20/2017     Bone Lab Results  Component Value Date   25OHVITD1 27 (L) 01/17/2020   25OHVITD2 3.5 01/17/2020   25OHVITD3 23 01/17/2020     Inflammation (CRP: Acute Phase) (ESR: Chronic Phase) Lab Results  Component Value Date   CRP 1.4 (H) 01/17/2020   ESRSEDRATE 17 01/17/2020   LATICACIDVEN 2.0 ([place]) 02/03/2017       Note: Above Lab results reviewed.  Imaging  DG Knee 1-2 Views Left CLINICAL DATA:  Bilateral leg pain.  EXAM: LEFT KNEE - 1-2 VIEW  COMPARISON:  None.  FINDINGS: Imaging obtained standing. No evidence of fracture, dislocation, or joint effusion. Normal alignment and joint spaces. No evidence of arthropathy or other focal bone abnormality. Minimal enthesopathic changes at the patellar insertion of the patellar tendon. Soft tissues are unremarkable.  IMPRESSION: Minimal enthesopathic changes at the patellar insertion of the patellar tendon. Otherwise unremarkable radiographs of the left knee  Electronically Signed   By: Keith Rake M.D.   On: 01/17/2020 21:49 DG Knee 1-2 Views Right CLINICAL DATA:  Bilateral leg pain.  EXAM: RIGHT KNEE - 1-2 VIEW  COMPARISON:  None.  FINDINGS: Imaging obtained standing. No evidence of fracture, dislocation, or joint effusion. Normal joint spaces and alignment. No evidence of arthropathy or other focal bone abnormality. Minimal enthesopathic changes at the patellar insertion of the patellar tendon. Soft tissues are unremarkable.  IMPRESSION: Minimal enthesopathic changes at the patellar insertion  of the patellar tendon. Otherwise unremarkable radiographs of the right knee.  Electronically Signed   By: Keith Rake M.D.   On: 01/17/2020 21:49 DG HIP UNILAT W OR W/O PELVIS 2-3 VIEWS LEFT CLINICAL DATA:  Left hip pain, arthralgia.  EXAM: DG HIP (WITH OR WITHOUT PELVIS) 2-3V LEFT  COMPARISON:  None.  FINDINGS: The cortical margins of the bony pelvis are intact. No fracture. Pubic symphysis and sacroiliac joints are congruent. The left femoral head is well-seated in the acetabula. Hip joint spaces preserved. There is no evidence of avascular necrosis, bony destruction, or focal bone lesion. Regional soft tissues are unremarkable.  IMPRESSION: Negative radiographs of the left hip.  Electronically Signed   By: Keith Rake M.D.   On: 01/17/2020 21:47 DG HIP UNILAT W OR W/O PELVIS 2-3 VIEWS RIGHT CLINICAL DATA:  Right hip pain, arthralgia.  EXAM: DG HIP (WITH OR WITHOUT PELVIS) 2-3V RIGHT  COMPARISON:  05/13/2017  FINDINGS: The cortical margins of the bony pelvis are intact. No fracture. Pubic  symphysis and sacroiliac joints are congruent. The right femoral head is well-seated in the acetabula. Hip joint spaces preserved. There is no evidence of avascular necrosis, bony destruction, or focal bone lesion. Regional soft tissues are unremarkable.  IMPRESSION: Negative radiograph of the right hip.  Electronically Signed   By: Keith Rake M.D.   On: 01/17/2020 21:46 DG Lumbar Spine Complete W/Bend CLINICAL DATA:  Lumbosacral back pain. Multiple falls. Patient reports she broke her back in 2017.  EXAM: LUMBAR SPINE - COMPLETE WITH BENDING VIEWS  COMPARISON:  Lumbar MRI 09/29/2016. Spine CT 03/11/2015  FINDINGS: The alignment is maintained. Vertebral body heights are normal. There is no listhesis. The posterior elements are intact. Disc spaces are preserved. No fracture. The previous left L1 transverse process fracture is not seen and likely  healed. There is no abnormal motion on flexion or extension imaging. Mild facet hypertrophy at L4-L5 and L5-S1. Sacroiliac joints are symmetric and normal.  IMPRESSION: 1. Mild facet hypertrophy in the lower lumbar spine. 2. Previous left L1 transverse process fracture is not seen and likely healed.  Electronically Signed   By: Keith Rake M.D.   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  Plan of Care  Problem-specific:  No problem-specific Assessment & Plan notes found for this encounter.  Kelly Rollins has a current medication list which includes the following long-term medication(s): amlodipine, metoprolol tartrate, novolog mix 70/30, omeprazole, tizanidine, tramadol, and venlafaxine xr.  Pharmacotherapy (Medications Ordered): Meds ordered this encounter  Medications  . diazepam (VALIUM) 5 MG tablet    Sig: Take 1 tablet (5 mg total) by mouth as needed for up to 2 doses for anxiety (Take one tab 45 minutes before MRI. Take second tablet just prior to MRI scan). Do not take medication within 4 hours of taking opioid pain medications. Must have a driver. Do not drive or operate machinery x 24 hours after taking this medication.    Dispense:  2 tablet    Refill:  0    Must have a driver. Do not drive or operate machinery x 24 hours after taking this medication.  . traMADol (ULTRAM) 50 MG tablet    Sig: Take 1 tablet (50 mg total) by mouth every 12 (twelve) hours as needed for up to 7 days for severe pain. Must last 7 days    Dispense:  14 tablet    Refill:  0   Orders:  No orders of the defined types were placed in this encounter.  Follow-up plan:   Return for (F2F), (s/p Tests) to review MRI and nerve conduction test.      Interventional treatment options: Planned, scheduled, and/or pending:   Diagnostic  left caudal epidural steroid injection #1 under fluoroscopic guidance and IV sedation    Under consideration:   Diagnostic bilateral IA hip joint injection  Diagnostic left IA hip joint injection    Therapeutic/palliative (PRN):   Palliative bilateral lumbar facet block #4 (11/17/2018) Palliative left L1 transverse process injection #2  Palliative righ lumbar facet RFA #3 (last done 02/21/2019) Palliative left lumbar facet RFA #3 (last done 02/07/2019)    Recent Visits Date Type Provider Dept  01/17/20 Office Visit Milinda Pointer, MD [facility]  Showing recent visits within past 90 days and meeting all other requirements Today's Visits Date Type Provider Dept  01/25/20 Telemedicine  Milinda Pointer, MD [facility]  Showing today's visits and meeting all other requirements Future Appointments Date Type Provider Dept  02/15/20 Appointment Milinda Pointer, MD [facility]  Showing future appointments within next 90 days and meeting all other requirements  I discussed the assessment and treatment plan with the patient. The patient was provided an opportunity to ask questions and all were answered. The patient agreed with the plan and demonstrated an understanding of the instructions.  Patient advised to call back or seek an in-person evaluation if the symptoms or condition worsens.  Duration of encounter: 15 minutes.  Note by: Gaspar Cola, MD Date: 01/25/2020; Time: 11:33 AM

## 2020-01-25 NOTE — Telephone Encounter (Signed)
LM for patient to call office for virtual appointment questions.  °

## 2020-01-29 ENCOUNTER — Ambulatory Visit
Admission: RE | Admit: 2020-01-29 | Discharge: 2020-01-29 | Disposition: A | Discharge status: Home / Self Care | Payer: Medicare Other | Source: Ambulatory Visit | Attending: Pain Medicine | Admitting: Pain Medicine

## 2020-01-29 ENCOUNTER — Other Ambulatory Visit: Payer: Self-pay

## 2020-01-29 DIAGNOSIS — M79605 Pain in left leg: Secondary | ICD-10-CM | POA: Diagnosis present

## 2020-01-29 DIAGNOSIS — M5136 Other intervertebral disc degeneration, lumbar region: Secondary | ICD-10-CM | POA: Insufficient documentation

## 2020-01-29 DIAGNOSIS — M5417 Radiculopathy, lumbosacral region: Secondary | ICD-10-CM | POA: Diagnosis present

## 2020-01-29 DIAGNOSIS — M545 Low back pain, unspecified: Secondary | ICD-10-CM | POA: Insufficient documentation

## 2020-01-29 DIAGNOSIS — G8929 Other chronic pain: Secondary | ICD-10-CM | POA: Insufficient documentation

## 2020-01-29 DIAGNOSIS — M79604 Pain in right leg: Secondary | ICD-10-CM | POA: Diagnosis present

## 2020-02-14 ENCOUNTER — Encounter: Payer: Self-pay | Admitting: Pain Medicine

## 2020-02-15 ENCOUNTER — Encounter: Payer: Self-pay | Admitting: Pain Medicine

## 2020-02-15 ENCOUNTER — Ambulatory Visit: Payer: Medicare Other | Admitting: Pain Medicine

## 2020-02-18 NOTE — Progress Notes (Signed)
Patient: Kelly Rollins  Service Category: E/M  Provider: Gaspar Cola, MD  DOB: 1970-10-07  DOS: 02/19/2020  Location: Office  MRN: [id number]  Setting: Ambulatory outpatient  Referring Provider: [name],*  Type: Established Patient  Specialty: Interventional Pain Management  PCP: [name], PA-C  Location: Remote location  Delivery: TeleHealth     Virtual Encounter - Pain Management PROVIDER NOTE: Information contained herein reflects review and annotations entered in association with encounter. Interpretation of such information and data should be left to medically-trained personnel. Information provided to patient can be located elsewhere in the medical record under "Patient Instructions". Document created using STT-dictation technology, any transcriptional errors that may result from process are unintentional.    Contact & Pharmacy Preferred: [phone] Home: [phone] (home) Mobile: [phone] (mobile) E-mail: [email or web address], [place] - [address] [place] [address] Phone: [phone] Fax: [phone]   Pre-screening  Kelly Rollins offered "in-person" vs "virtual" encounter. She indicated preferring virtual for this encounter.   Reason COVID-19*  Social distancing based on CDC and AMA recommendations.   I contacted Kelly Rollins on 02/19/2020 via telephone.      I clearly identified myself as Gaspar Cola, MD. I verified that I was speaking with the correct person using two identifiers (Name: Kelly Rollins, and date of birth: [birth year]).  Consent I sought verbal advanced consent from Kelly Rollins for virtual visit interactions. I informed Kelly Rollins of possible security and privacy concerns, risks, and limitations associated with providing "not-in-person" medical evaluation and management services. I also informed Kelly Rollins of the availability of  "in-person" appointments. Finally, I informed her that there would be a charge for the virtual visit and that she could be  personally, fully or partially, financially responsible for it. Kelly Rollins expressed understanding and agreed to proceed.   Historic Elements   Kelly Rollins is a 50 y.o. year old, female patient evaluated today after our last contact on 01/24/2020. Kelly Rollins  has a past medical history of ABDOMINAL PAIN OTHER SPECIFIED SITE (02/21/2009), ABDOMINAL PAIN-LUQ (04/17/2009), Anal fissure, Asthma, Diabetes mellitus without complication (Hendrix), OMBTDHRC(163.8), HEMATOCHEZIA (04/17/2009), Hypertension, IBS (irritable bowel syndrome), Migraine headache (11/06/2008), OVARIAN CYST, RIGHT (02/21/2009), PALPITATIONS, OCCASIONAL (04/17/2010), and Pancreatitis (10/14/2017). She also  has a past surgical history that includes Cesarean section; Vaginal hysterectomy; Cholecystectomy; Breast reduction surgery; Breast excisional biopsy (Left, 1997); and Reduction mammaplasty (Bilateral, 1997). Kelly Rollins has a current medication list which includes the following prescription(s): amlodipine, artificial tears, glipizide, hydrocodone-acetaminophen, ibuprofen, metoprolol tartrate, novolog mix 70/30, omeprazole, rosuvastatin, semaglutide(0.25 or 0.5m/dos), tizanidine, venlafaxine xr, and tramadol. She  reports that she quit smoking about 5 years ago. Her smoking use included cigarettes. She has never used smokeless tobacco. She reports current alcohol use. She reports that she does not use drugs. Kelly Rollins allergic to atorvastatin, levofloxacin, morphine, and penicillins.   HPI  Today, she is being contacted for medication management.  Today went over the results of the MRI with the patient.  Those results can be seen just below.  She indicates that she has continued to have problems with her diabetes where even when it appears that the blood sugar is under control it remains over the 200s.   Apparently she has talked to her endocrinologist about the possibility of an insulin pump.  For the time being we will stay with the tramadol and we will schedule her to come in for  a caudal epidural steroid injection under fluoroscopic guidance and IV sedation.  She is aware that the steroids will bring her blood sugar up, but she indicates that she will try to stay on top of it.  Today I also reviewed the chart to see if I could find the results of her recent nerve conduction test, but apparently have not been dictated as of today.  RTCB:  03/20/2020 Nonopioids transferred 01/17/2020: Zanaflex  (01/29/20) Lumbar MRI FINDINGS: Disc levels: Desiccation at the L5-S1 level. L4-5: Minimal disc bulge. Right foraminal annular fissuring. Minimal to mild bilateral neural foraminal narrowing. L5-S1: Disc bulge with superimposed left paracentral protrusion/annular fissuring abutting the descending S1 nerve roots. Minimal to mild bilateral neural foraminal narrowing.  IMPRESSION: Mild spondylosis. Minimal to mild bilateral neural foraminal narrowing at the L4-5 and L5-S1 level. Left L5-S1 paracentral protrusion/annular fissuring abutting the descending S1 nerve roots.  Pharmacotherapy Assessment  Analgesic: No opioid analgesic prescriptions from our practice.  Hydrocodone/APAP 5/325, 1 tab PO q 8 hrs (15 mg/day of hydrocodone) (written by Lavonia Drafts. Whitaker, PA-C) #90 (on 10/31/2018) MME/day:48m/day.   Monitoring: [place] PMP: PDMP reviewed during this encounter.       Pharmacotherapy: No side-effects or adverse reactions reported. Compliance: No problems identified. Effectiveness: Clinically acceptable. Plan: Refer to "POC".  UDS:  Summary  Date Value Ref Range Status  01/18/2017 FINAL  Final    Comment:    ==================================================================== TOXASSURE SELECT 13 (MW) ==================================================================== Test                              Result       Flag       Units Drug Absent but Declared for Prescription Verification   Oxycodone                      Not Detected UNEXPECTED ng/mg creat ==================================================================== Test                      Result    Flag   Units      Ref Range   Creatinine              209              mg/dL      >=20 ==================================================================== Declared Medications:  The flagging and interpretation on this report are based on the  following declared medications.  Unexpected results may arise from  inaccuracies in the declared medications.  **Note: The testing scope of this panel includes these medications:  Oxycodone  **Note: The testing scope of this panel does not include following  reported medications:  Acetaminophen  Albuterol  Aspirin  Caffeine  Gabapentin  Ibuprofen  Magnesium  Metformin  Metoprolol (Toprol)  Multivitamin (MVI)  Ondansetron (Zofran)  Venlafaxine (Effexor)  Vitamin C  Vitamin D3 ==================================================================== For clinical consultation, please call [phone] ====================================================================     Laboratory Chemistry Profile   Renal Lab Results  Component Value Date   BUN 12 01/17/2020   CREATININE 0.66 01/17/2020   BCR 14 05/06/2017   GFR 88.95 03/19/2010   GFRAA >60 11/05/2017   GFRNONAA >60 01/17/2020     Hepatic Lab Results  Component Value Date   AST 25 01/17/2020   ALT 31 01/17/2020   ALBUMIN 4.1 01/17/2020   ALKPHOS 109 01/17/2020   AMYLASE 40 04/03/2010   LIPASE 27 11/05/2017     Electrolytes Lab  Results  Component Value Date   NA 132 (L) 01/17/2020   K 4.4 01/17/2020   CL 95 (L) 01/17/2020   CALCIUM 9.8 01/17/2020   MG 2.1 01/17/2020   PHOS 4.4 10/20/2017     Bone Lab Results  Component Value Date   25OHVITD1 27 (L) 01/17/2020   25OHVITD2 3.5 01/17/2020   25OHVITD3  23 01/17/2020     Inflammation (CRP: Acute Phase) (ESR: Chronic Phase) Lab Results  Component Value Date   CRP 1.4 (H) 01/17/2020   ESRSEDRATE 17 01/17/2020   LATICACIDVEN 2.0 ([place]) 02/03/2017       Note: Above Lab results reviewed.  Imaging  MR LUMBAR SPINE WO CONTRAST CLINICAL DATA:  Osteoarthritis, lumbosacral Low back pain, > 6 wks Lumbar radiculopathy, > 6 wks  EXAM: MRI LUMBAR SPINE WITHOUT CONTRAST  TECHNIQUE: Multiplanar, multisequence MR imaging of the lumbar spine was performed. No intravenous contrast was administered.  COMPARISON:  01/17/2020 and prior.  FINDINGS: Segmentation:  Standard.  Alignment:  Normal.  Vertebrae: Normal bone marrow signal intensity. Vertebral body heights are preserved. T12 hemangioma.  Conus medullaris and cauda equina: Conus extends to the L1 level. Conus and cauda equina appear normal.  Disc levels: Desiccation at the L5-S1 level.  L1-2: No significant disc bulge. Patent spinal canal and neural foramen.  L2-3: No significant disc bulge. Patent spinal canal and neural foramen.  L3-4: No significant disc bulge. Patent spinal canal and neural foramen.  L4-5: Minimal disc bulge. Right foraminal annular fissuring. Patent spinal canal. Minimal to mild bilateral neural foraminal narrowing.  L5-S1: Disc bulge with superimposed left paracentral protrusion/annular fissuring abutting the descending S1 nerve roots. Patent spinal canal. Minimal to mild bilateral neural foraminal narrowing.  Paraspinal and other soft tissues: Negative.  IMPRESSION: Mild spondylosis. Minimal to mild bilateral neural foraminal narrowing at the L4-5 and L5-S1 level.  No significant spinal canal narrowing.  Left L5-S1 paracentral protrusion/annular fissuring abutting the descending S1 nerve roots.  Electronically Signed   By: Primitivo Gauze M.D.   On: 01/29/2020 16:20  Assessment  The primary encounter diagnosis was Chronic pain  syndrome. Diagnoses of Chronic low back pain (Primary Source of Pain) (Bilateral) (L>R), Lumbosacral radiculopathy (S1) (Left), Lumbosacral radiculopathy at S1 (Left), Chronic lower extremity pain (Bilateral) (L>R), and Abnormal MRI, lumbar spine (01/29/2020) were also pertinent to this visit.  Plan of Care  Problem-specific:  No problem-specific Assessment & Plan notes found for this encounter.  Kelly Rollins has a current medication list which includes the following long-term medication(s): amlodipine, glipizide, metoprolol tartrate, novolog mix 70/30, omeprazole, tizanidine, venlafaxine xr, and tramadol.  Pharmacotherapy (Medications Ordered): Meds ordered this encounter  Medications  . traMADol (ULTRAM) 50 MG tablet    Sig: Take 1 tablet (50 mg total) by mouth every 12 (twelve) hours as needed for severe pain. Must last 30 days    Dispense:  60 tablet    Refill:  0    Chronic Pain: STOP Act (Not applicable) Fill 1 day early if closed on refill date. Avoid benzodiazepines within 8 hours of opioids   Orders:  Orders Placed This Encounter  Procedures  . Caudal Epidural Injection    Standing Status:   Future    Standing Expiration Date:   03/21/2020    Scheduling Instructions:     Laterality: Left-sided     Level(s): Sacrococcygeal canal (Tailbone area)     Sedation: Patient's choice     Scheduling Timeframe: As soon as pre-approved  Order Specific Question:   Where will this procedure be performed?    Answer:   [facility]   Follow-up plan:   Return in about 30 days (around 03/20/2020) for (F2F), (Med Mgmt) + Procedure (w/ sedation): (L) Caudal ESI #1 (ASAA).      Interventional treatment options: Planned, scheduled, and/or pending:   Diagnostic left caudal epidural steroid injection #1 under fluoroscopic guidance and IV sedation    Under consideration:   Diagnostic bilateral IA hip joint injection  Diagnostic left IA hip joint injection     Therapeutic/palliative (PRN):   Palliative bilateral lumbar facet block #4 (11/17/2018) Palliative left L1 transverse process injection #2  Palliative righ lumbar facet RFA #3 (last done 02/21/2019) Palliative left lumbar facet RFA #3 (last done 02/07/2019)    Recent Visits Date Type Provider Dept  01/25/20 Telemedicine Milinda Pointer, [facility]  01/17/20 Office Visit Milinda Pointer, MD [facility]  Showing recent visits within past 90 days and meeting all other requirements Today's Visits Date Type Provider Dept  02/19/20 Telemedicine Milinda Pointer, MD [facility]  Showing today's visits and meeting all other requirements Future Appointments No visits were found meeting these conditions. Showing future appointments within next 90 days and meeting all other requirements  I discussed the assessment and treatment plan with the patient. The patient was provided an opportunity to ask questions and all were answered. The patient agreed with the plan and demonstrated an understanding of the instructions.  Patient advised to call back or seek an in-person evaluation if the symptoms or condition worsens.  Duration of encounter: 15 minutes.  Note by: Gaspar Cola, MD Date: 02/19/2020; Time: 12:44 PM

## 2020-02-19 ENCOUNTER — Ambulatory Visit: Payer: Medicare Other | Attending: Pain Medicine | Admitting: Pain Medicine

## 2020-02-19 ENCOUNTER — Other Ambulatory Visit: Payer: Self-pay

## 2020-02-19 DIAGNOSIS — M545 Low back pain, unspecified: Secondary | ICD-10-CM

## 2020-02-19 DIAGNOSIS — R937 Abnormal findings on diagnostic imaging of other parts of musculoskeletal system: Secondary | ICD-10-CM | POA: Insufficient documentation

## 2020-02-19 DIAGNOSIS — M79604 Pain in right leg: Secondary | ICD-10-CM | POA: Diagnosis not present

## 2020-02-19 DIAGNOSIS — G894 Chronic pain syndrome: Secondary | ICD-10-CM

## 2020-02-19 DIAGNOSIS — M5417 Radiculopathy, lumbosacral region: Secondary | ICD-10-CM

## 2020-02-19 DIAGNOSIS — G8929 Other chronic pain: Secondary | ICD-10-CM

## 2020-02-19 DIAGNOSIS — M79605 Pain in left leg: Secondary | ICD-10-CM

## 2020-02-19 MED ORDER — TRAMADOL HCL 50 MG PO TABS: 50.0000 mg | ORAL_TABLET | Freq: Two times a day (BID) | ORAL | 0 refills | Status: AC | PRN

## 2020-02-19 NOTE — Patient Instructions (Signed)
____________________________________________________________________________________________  Preparing for Procedure with Sedation  Procedure appointments are limited to planned procedures: . No Prescription Refills. . No disability issues will be discussed. . No medication changes will be discussed.  Instructions: . Oral Intake: Do not eat or drink anything for at least 8 hours prior to your procedure. (Exception: Blood Pressure Medication. See below.) . Transportation: Unless otherwise stated by your physician, you may drive yourself after the procedure. . Blood Pressure Medicine: Do not forget to take your blood pressure medicine with a sip of water the morning of the procedure. If your Diastolic (lower reading)is above 100 mmHg, elective cases will be cancelled/rescheduled. . Blood thinners: These will need to be stopped for procedures. Notify our staff if you are taking any blood thinners. Depending on which one you take, there will be specific instructions on how and when to stop it. . Diabetics on insulin: Notify the staff so that you can be scheduled 1st case in the morning. If your diabetes requires high dose insulin, take only  of your normal insulin dose the morning of the procedure and notify the staff that you have done so. . Preventing infections: Shower with an antibacterial soap the morning of your procedure. . Build-up your immune system: Take 1000 mg of Vitamin C with every meal (3 times a day) the day prior to your procedure. . Antibiotics: Inform the staff if you have a condition or reason that requires you to take antibiotics before dental procedures. . Pregnancy: If you are pregnant, call and cancel the procedure. . Sickness: If you have a cold, fever, or any active infections, call and cancel the procedure. . Arrival: You must be in the facility at least 30 minutes prior to your scheduled procedure. . Children: Do not bring children with you. . Dress appropriately:  Bring dark clothing that you would not mind if they get stained. . Valuables: Do not bring any jewelry or valuables.  Reasons to call and reschedule or cancel your procedure: (Following these recommendations will minimize the risk of a serious complication.) . Surgeries: Avoid having procedures within 2 weeks of any surgery. (Avoid for 2 weeks before or after any surgery). . Flu Shots: Avoid having procedures within 2 weeks of a flu shots or . (Avoid for 2 weeks before or after immunizations). . Barium: Avoid having a procedure within 7-10 days after having had a radiological study involving the use of radiological contrast. (Myelograms, Barium swallow or enema study). . Heart attacks: Avoid any elective procedures or surgeries for the initial 6 months after a "Myocardial Infarction" (Heart Attack). . Blood thinners: It is imperative that you stop these medications before procedures. Let us know if you if you take any blood thinner.  . Infection: Avoid procedures during or within two weeks of an infection (including chest colds or gastrointestinal problems). Symptoms associated with infections include: Localized redness, fever, chills, night sweats or profuse sweating, burning sensation when voiding, cough, congestion, stuffiness, runny nose, sore throat, diarrhea, nausea, vomiting, cold or Flu symptoms, recent or current infections. It is specially important if the infection is over the area that we intend to treat. . Heart and lung problems: Symptoms that may suggest an active cardiopulmonary problem include: cough, chest pain, breathing difficulties or shortness of breath, dizziness, ankle swelling, uncontrolled high or unusually low blood pressure, and/or palpitations. If you are experiencing any of these symptoms, cancel your procedure and contact your primary care physician for an evaluation.  Remember:  Regular Business hours are:    Monday to Thursday 8:00 AM to 4:00 PM  Provider's  Schedule: Alfredo Spong, MD:  Procedure days: Tuesday and Thursday 7:30 AM to 4:00 PM  Bilal Lateef, MD:  Procedure days: Monday and Wednesday 7:30 AM to 4:00 PM ____________________________________________________________________________________________    

## 2020-02-20 ENCOUNTER — Other Ambulatory Visit: Payer: Self-pay

## 2020-02-26 NOTE — Progress Notes (Deleted)
PROVIDER NOTE: Information contained herein reflects review and annotations entered in association with encounter. Interpretation of such information and data should be left to medically-trained personnel. Information provided to patient can be located elsewhere in the medical record under "Patient Instructions". Document created using STT-dictation technology, any transcriptional errors that may result from process are unintentional.    Patient: Kelly Rollins  Service Category: Procedure  Provider: Gaspar Cola, MD  DOB: 12/31/1970  DOS: 02/29/2020  Location: [facility]  MRN: [id number]  Setting: Ambulatory - outpatient  Referring Provider: [name],*  Type: Established Patient  Specialty: Interventional Pain Management  PCP: [name], PA-C   Primary Reason for Visit: Interventional Pain Management Treatment. CC: No chief complaint on file.  Procedure:          Anesthesia, Analgesia, Anxiolysis:  Type: Diagnostic Epidural Steroid Injection #1  Region: Caudal Level: Sacrococcygeal   Laterality: Midline aiming at the left  Type: Moderate (Conscious) Sedation combined with Local Anesthesia Indication(s): Analgesia and Anxiety Route: Intravenous (IV) IV Access: Secured Sedation: Meaningful verbal contact was maintained at all times during the procedure  Local Anesthetic: Lidocaine 1-2%  Position: Prone   Indications: No diagnosis found. Pain Score: Pre-procedure:  /10 Post-procedure:  /10   Pre-op H&P Assessment:  Kelly Rollins is a 50 y.o. (year old), female patient, seen today for interventional treatment. She  has a past surgical history that includes Cesarean section; Vaginal hysterectomy; Cholecystectomy; Breast reduction surgery; Breast excisional biopsy (Left, 1997); and Reduction mammaplasty (Bilateral, 1997). Kelly Rollins has a current medication list which includes the following prescription(s): amlodipine, artificial tears,  glipizide, hydrocodone-acetaminophen, ibuprofen, metoprolol tartrate, novolog mix 70/30, omeprazole, rosuvastatin, semaglutide(0.25 or 0.5mg /dos), tizanidine, tramadol, and venlafaxine xr. Her primarily concern today is the No chief complaint on file.  Initial Vital Signs:  Pulse/HCG Rate:    Temp:   Resp:   BP:   SpO2:    BMI: Estimated body mass index is 30.9 kg/m as calculated from the following:   Height as of 01/17/20: 5\' 4"  (1.626 m).   Weight as of 01/17/20: 180 lb (81.6 kg).  Risk Assessment: Allergies: Reviewed. She is allergic to atorvastatin, levofloxacin, morphine, and penicillins.  Allergy Precautions: None required Coagulopathies: Reviewed. None identified.  Blood-thinner therapy: None at this time Active Infection(s): Reviewed. None identified. Kelly Rollins is afebrile  Site Confirmation: Kelly Rollins was asked to confirm the procedure and laterality before marking the site Procedure checklist: Completed Consent: Before the procedure and under the influence of no sedative(s), amnesic(s), or anxiolytics, the patient was informed of the treatment options, risks and possible complications. To fulfill our ethical and legal obligations, as recommended by the American Medical Association's Code of Ethics, I have informed the patient of my clinical impression; the nature and purpose of the treatment or procedure; the risks, benefits, and possible complications of the intervention; the alternatives, including doing nothing; the risk(s) and benefit(s) of the alternative treatment(s) or procedure(s); and the risk(s) and benefit(s) of doing nothing. The patient was provided information about the general risks and possible complications associated with the procedure. These may include, but are not limited to: failure to achieve desired goals, infection, bleeding, organ or nerve damage, allergic reactions, paralysis, and death. In addition, the patient was informed of those risks and  complications associated to Spine-related procedures, such as failure to decrease pain; infection (i.e.: Meningitis, epidural or intraspinal abscess); bleeding (i.e.: epidural hematoma, subarachnoid hemorrhage, or any other type of intraspinal or  peri-dural bleeding); organ or nerve damage (i.e.: Any type of peripheral nerve, nerve root, or spinal cord injury) with subsequent damage to sensory, motor, and/or autonomic systems, resulting in permanent pain, numbness, and/or weakness of one or several areas of the body; allergic reactions; (i.e.: anaphylactic reaction); and/or death. Furthermore, the patient was informed of those risks and complications associated with the medications. These include, but are not limited to: allergic reactions (i.e.: anaphylactic or anaphylactoid reaction(s)); adrenal axis suppression; blood sugar elevation that in diabetics may result in ketoacidosis or comma; water retention that in patients with history of congestive heart failure may result in shortness of breath, pulmonary edema, and decompensation with resultant heart failure; weight gain; swelling or edema; medication-induced neural toxicity; particulate matter embolism and blood vessel occlusion with resultant organ, and/or nervous system infarction; and/or aseptic necrosis of one or more joints. Finally, the patient was informed that Medicine is not an exact science; therefore, there is also the possibility of unforeseen or unpredictable risks and/or possible complications that may result in a catastrophic outcome. The patient indicated having understood very clearly. We have given the patient no guarantees and we have made no promises. Enough time was given to the patient to ask questions, all of which were answered to the patient's satisfaction. Kelly Rollins has indicated that she wanted to continue with the procedure. Attestation: I, the ordering provider, attest that I have discussed with the patient the benefits, risks,  side-effects, alternatives, likelihood of achieving goals, and potential problems during recovery for the procedure that I have provided informed consent. Date  Time: {CHL [facility]-PAIN TIME CHOICES:[id number]}  Pre-Procedure Preparation:  Monitoring: As per clinic protocol. Respiration, ETCO2, SpO2, BP, heart rate and rhythm monitor placed and checked for adequate function Safety Precautions: Patient was assessed for positional comfort and pressure points before starting the procedure. Time-out: I initiated and conducted the "Time-out" before starting the procedure, as per protocol. The patient was asked to participate by confirming the accuracy of the "Time Out" information. Verification of the correct person, site, and procedure were performed and confirmed by me, the nursing staff, and the patient. "Time-out" conducted as per Joint Commission's Universal Protocol (UP.01.01.01). Time:    Description of Procedure:          Target Area: Caudal Epidural Canal. Approach: Midline approach. Area Prepped: Entire Posterior Sacrococcygeal Region DuraPrep (Iodine Povacrylex [0.7% available iodine] and Isopropyl Alcohol, 74% w/w) Safety Precautions: Aspiration looking for blood return was conducted prior to all injections. At no point did we inject any substances, as a needle was being advanced. No attempts were made at seeking any paresthesias. Safe injection practices and needle disposal techniques used. Medications properly checked for expiration dates. SDV (single dose vial) medications used. Description of the Procedure: Protocol guidelines were followed. The patient was placed in position over the fluoroscopy table. The target area was identified and the area prepped in the usual manner. Skin & deeper tissues infiltrated with local anesthetic. Appropriate amount of time allowed to pass for local anesthetics to take effect. The procedure needles were then advanced to the target area. Proper needle placement  secured. Negative aspiration confirmed. Solution injected in intermittent fashion, asking for systemic symptoms every 0.5cc of injectate. The needles were then removed and the area cleansed, making sure to leave some of the prepping solution back to take advantage of its long term bactericidal properties. There were no vitals filed for this visit.  Start Time:   hrs. End Time:   hrs. Materials:  Needle(s) Type: Epidural needle Gauge: 17G Length: 3.5-in Medication(s): Please see orders for medications and dosing details.  Imaging Guidance (Spinal):          Type of Imaging Technique: Fluoroscopy Guidance (Spinal) Indication(s): Assistance in needle guidance and placement for procedures requiring needle placement in or near specific anatomical locations not easily accessible without such assistance. Exposure Time: Please see nurses notes. Contrast: Before injecting any contrast, we confirmed that the patient did not have an allergy to iodine, shellfish, or radiological contrast. Once satisfactory needle placement was completed at the desired level, radiological contrast was injected. Contrast injected under live fluoroscopy. No contrast complications. See chart for type and volume of contrast used. Fluoroscopic Guidance: I was personally present during the use of fluoroscopy. "Tunnel Vision Technique" used to obtain the best possible view of the target area. Parallax error corrected before commencing the procedure. "Direction-depth-direction" technique used to introduce the needle under continuous pulsed fluoroscopy. Once target was reached, antero-posterior, oblique, and lateral fluoroscopic projection used confirm needle placement in all planes. Images permanently stored in EMR. Interpretation: I personally interpreted the imaging intraoperatively. Adequate needle placement confirmed in multiple planes. Appropriate spread of contrast into desired area was observed. No evidence of afferent or efferent  intravascular uptake. No intrathecal or subarachnoid spread observed. Permanent images saved into the patient's record.  Antibiotic Prophylaxis:   Anti-infectives (From admission, onward)   None     Indication(s): None identified  Post-operative Assessment:  Post-procedure Vital Signs:  Pulse/HCG Rate:    Temp:   Resp:   BP:   SpO2:    EBL: None  Complications: No immediate post-treatment complications observed by team, or reported by patient.  Note: The patient tolerated the entire procedure well. A repeat set of vitals were taken after the procedure and the patient was kept under observation following institutional policy, for this type of procedure. Post-procedural neurological assessment was performed, showing return to baseline, prior to discharge. The patient was provided with post-procedure discharge instructions, including a section on how to identify potential problems. Should any problems arise concerning this procedure, the patient was given instructions to immediately contact us, at any time, without hesitation. In any case, we plan to contact the patient by telephone for a follow-up status report regarding this interventional procedure.  Comments:  No additional relevant information.  Plan of Care  Orders:  No orders of the defined types were placed in this encounter.  Chronic Opioid Analgesic:  No opioid analgesic prescriptions from our practice.  Hydrocodone/APAP 5/325, 1 tab PO q 8 hrs (15 mg/day of hydrocodone) (written by Lavonia Drafts. Whitaker, PA-C) #90 (on 10/31/2018) MME/day:15mg /day.   Medications ordered for procedure: No orders of the defined types were placed in this encounter.  Medications administered: Kelly Rollins had no medications administered during this visit.  See the medical record for exact dosing, route, and time of administration.  Follow-up plan:   No follow-ups on file.       Interventional treatment options: Planned, scheduled,  and/or pending:   Diagnostic left caudal epidural steroid injection #1 under fluoroscopic guidance and IV sedation    Under consideration:   Diagnostic bilateral IA hip joint injection  Diagnostic left IA hip joint injection    Therapeutic/palliative (PRN):   Palliative bilateral lumbar facet block #4 (11/17/2018) Palliative left L1 transverse process injection #2  Palliative righ lumbar facet RFA #3 (last done 02/21/2019) Palliative left lumbar facet RFA #3 (last done 02/07/2019)     Recent Visits  Date Type Provider Dept  02/19/20 Telemedicine Milinda Pointer, MD [facility]  01/25/20 Telemedicine Milinda Pointer, MD [facility]  01/17/20 Office Visit Milinda Pointer, MD [facility]  Showing recent visits within past 90 days and meeting all other requirements Future Appointments Date Type Provider Dept  02/29/20 Appointment Milinda Pointer, MD [facility]  Showing future appointments within next 90 days and meeting all other requirements  Disposition: Discharge home  Discharge (Date  Time): 02/29/2020;   hrs.   Primary Care Physician: [name], PA-C Location: [facility] Note by: Gaspar Cola, MD Date: 02/29/2020; Time: 8:17 AM  Disclaimer:  Medicine is not an Chief Strategy Officer. The only guarantee in medicine is that nothing is guaranteed. It is important to note that the decision to proceed with this intervention was based on the information collected from the patient. The Data and conclusions were drawn from the patient's questionnaire, the interview, and the physical examination. Because the information was provided in large part by the patient, it cannot be guaranteed that it has not been purposely or unconsciously manipulated. Every effort has been made to obtain as much relevant data as possible for this evaluation. It is important to note that the conclusions that lead to this  procedure are derived in large part from the available data. Always take into account that the treatment will also be dependent on availability of resources and existing treatment guidelines, considered by other Pain Management Practitioners as being common knowledge and practice, at the time of the intervention. For Medico-Legal purposes, it is also important to point out that variation in procedural techniques and pharmacological choices are the acceptable norm. The indications, contraindications, technique, and results of the above procedure should only be interpreted and judged by a Board-Certified Interventional Pain Specialist with extensive familiarity and expertise in the same exact procedure and technique.

## 2020-02-29 ENCOUNTER — Ambulatory Visit: Payer: Medicare Other | Admitting: Pain Medicine

## 2020-07-12 ENCOUNTER — Other Ambulatory Visit: Payer: Self-pay | Admitting: Gastroenterology

## 2020-07-12 DIAGNOSIS — R1012 Left upper quadrant pain: Secondary | ICD-10-CM

## 2020-07-12 DIAGNOSIS — K581 Irritable bowel syndrome with constipation: Secondary | ICD-10-CM

## 2020-07-17 ENCOUNTER — Ambulatory Visit: Admission: RE | Admit: 2020-07-17 | Payer: Medicare Other | Source: Ambulatory Visit

## 2020-10-18 ENCOUNTER — Encounter: Payer: Self-pay | Admitting: *Deleted

## 2020-10-21 ENCOUNTER — Encounter
Admission: RE | Disposition: A | Discharge status: Home / Self Care | Payer: Self-pay | Source: Home / Self Care | Attending: Gastroenterology

## 2020-10-21 ENCOUNTER — Encounter: Payer: Self-pay | Admitting: *Deleted

## 2020-10-21 ENCOUNTER — Other Ambulatory Visit: Payer: Self-pay

## 2020-10-21 ENCOUNTER — Ambulatory Visit: Payer: Medicare Other | Admitting: Anesthesiology

## 2020-10-21 ENCOUNTER — Ambulatory Visit
Admission: RE | Admit: 2020-10-21 | Discharge: 2020-10-21 | Disposition: A | Discharge status: Home / Self Care | Payer: Medicare Other | Attending: Gastroenterology | Admitting: Gastroenterology

## 2020-10-21 DIAGNOSIS — Z87891 Personal history of nicotine dependence: Secondary | ICD-10-CM | POA: Insufficient documentation

## 2020-10-21 DIAGNOSIS — E119 Type 2 diabetes mellitus without complications: Secondary | ICD-10-CM | POA: Insufficient documentation

## 2020-10-21 DIAGNOSIS — Z880 Allergy status to penicillin: Secondary | ICD-10-CM | POA: Diagnosis not present

## 2020-10-21 DIAGNOSIS — Z9049 Acquired absence of other specified parts of digestive tract: Secondary | ICD-10-CM | POA: Insufficient documentation

## 2020-10-21 DIAGNOSIS — E785 Hyperlipidemia, unspecified: Secondary | ICD-10-CM | POA: Diagnosis not present

## 2020-10-21 DIAGNOSIS — Z794 Long term (current) use of insulin: Secondary | ICD-10-CM | POA: Diagnosis not present

## 2020-10-21 DIAGNOSIS — Z885 Allergy status to narcotic agent status: Secondary | ICD-10-CM | POA: Diagnosis not present

## 2020-10-21 DIAGNOSIS — Z7984 Long term (current) use of oral hypoglycemic drugs: Secondary | ICD-10-CM | POA: Insufficient documentation

## 2020-10-21 DIAGNOSIS — K219 Gastro-esophageal reflux disease without esophagitis: Secondary | ICD-10-CM | POA: Diagnosis not present

## 2020-10-21 DIAGNOSIS — Z79899 Other long term (current) drug therapy: Secondary | ICD-10-CM | POA: Diagnosis not present

## 2020-10-21 DIAGNOSIS — K449 Diaphragmatic hernia without obstruction or gangrene: Secondary | ICD-10-CM | POA: Diagnosis not present

## 2020-10-21 DIAGNOSIS — Z881 Allergy status to other antibiotic agents status: Secondary | ICD-10-CM | POA: Insufficient documentation

## 2020-10-21 DIAGNOSIS — K2970 Gastritis, unspecified, without bleeding: Secondary | ICD-10-CM | POA: Diagnosis not present

## 2020-10-21 DIAGNOSIS — R1012 Left upper quadrant pain: Secondary | ICD-10-CM | POA: Diagnosis present

## 2020-10-21 HISTORY — DX: Dyspnea, unspecified: R06.00

## 2020-10-21 HISTORY — PX: ESOPHAGOGASTRODUODENOSCOPY (EGD) WITH PROPOFOL: SHX5813

## 2020-10-21 HISTORY — DX: Hyperlipidemia, unspecified: E78.5

## 2020-10-21 HISTORY — DX: Angina pectoris, unspecified: I20.9

## 2020-10-21 HISTORY — DX: Cardiac arrhythmia, unspecified: I49.9

## 2020-10-21 HISTORY — DX: Depression, unspecified: F32.A

## 2020-10-21 LAB — GLUCOSE, CAPILLARY: Glucose-Capillary: 171 mg/dL — ABNORMAL HIGH (ref 70–99)

## 2020-10-21 SURGERY — ESOPHAGOGASTRODUODENOSCOPY (EGD) WITH PROPOFOL
Anesthesia: General

## 2020-10-21 MED ORDER — PROPOFOL 500 MG/50ML IV EMUL
INTRAVENOUS | Status: DC | PRN
  Administered 2020-10-21: 175 ug/kg/min via INTRAVENOUS

## 2020-10-21 MED ORDER — SODIUM CHLORIDE 0.9 % IV SOLN: INTRAVENOUS | Status: DC

## 2020-10-21 MED ORDER — PROPOFOL 10 MG/ML IV BOLUS
INTRAVENOUS | Status: DC | PRN
  Administered 2020-10-21: 40 mg via INTRAVENOUS
  Administered 2020-10-21: 80 mg via INTRAVENOUS

## 2020-10-21 MED ORDER — PROPOFOL 500 MG/50ML IV EMUL
INTRAVENOUS | Status: AC
  Filled 2020-10-21: qty 50

## 2020-10-21 MED ORDER — LIDOCAINE HCL (CARDIAC) PF 100 MG/5ML IV SOSY
PREFILLED_SYRINGE | INTRAVENOUS | Status: DC | PRN
  Administered 2020-10-21: 50 mg via INTRAVENOUS

## 2020-10-21 NOTE — Anesthesia Preprocedure Evaluation (Signed)
Anesthesia Evaluation  Patient identified by MRN, date of birth, ID band Patient awake    Reviewed: Allergy & Precautions, H&P , NPO status , Patient's Chart, lab work & pertinent test results, reviewed documented beta blocker date and time   Airway Mallampati: II   Neck ROM: full    Dental  (+) Poor Dentition   Pulmonary shortness of breath and with exertion, asthma , sleep apnea , former smoker,    Pulmonary exam normal        Cardiovascular Exercise Tolerance: Good hypertension, On Medications + angina with exertion + DOE  Normal cardiovascular exam+ dysrhythmias  Rhythm:regular Rate:Normal     Neuro/Psych  Headaches, Anxiety Depression  Neuromuscular disease negative psych ROS   GI/Hepatic Neg liver ROS, GERD  Medicated,  Endo/Other  negative endocrine ROSdiabetes  Renal/GU negative Renal ROS  negative genitourinary   Musculoskeletal   Abdominal   Peds  Hematology negative hematology ROS (+)   Anesthesia Other Findings Past Medical History: 02/21/2009: ABDOMINAL PAIN OTHER SPECIFIED SITE     Comment:  Qualifier: Diagnosis of  ByDeborra Medina MD, Talia   04/17/2009: ABDOMINAL PAIN-LUQ     Comment:  Qualifier: Diagnosis of  By: Fuller Plan MD Lamont Snowball T  No date: Anal fissure No date: Anginal pain ([place]) No date: Asthma No date: Depression No date: Diabetes mellitus without complication ([place]) No date: Dyspnea No date: Dysrhythmia No date: Headache(784.0) 04/17/2009: HEMATOCHEZIA     Comment:  Qualifier: Diagnosis of  By: Fuller Plan MD Lamont Snowball T  No date: Hypertension No date: IBS (irritable bowel syndrome) 11/06/2008: Migraine headache     Comment:  Qualifier: Diagnosis of  ByDeborra Medina MD, Talia   02/21/2009: OVARIAN CYST, RIGHT     Comment:  Qualifier: Diagnosis of  ByDeborra Medina MD, Talia   04/17/2010: PALPITATIONS, OCCASIONAL     Comment:  Qualifier: Diagnosis of  ByDeborra Medina MD, Talia   10/14/2017:  Pancreatitis Past Surgical History: 1997: BREAST EXCISIONAL BIOPSY; Left     Comment:  ? left side, done at time of reduction , benign No date: BREAST REDUCTION SURGERY     Comment:  bilateral No date: CESAREAN SECTION No date: CHOLECYSTECTOMY 1997: REDUCTION MAMMAPLASTY; Bilateral No date: VAGINAL HYSTERECTOMY   Reproductive/Obstetrics negative OB ROS                             Anesthesia Physical Anesthesia Plan  ASA: 3  Anesthesia Plan: General   Post-op Pain Management:    Induction:   PONV Risk Score and Plan:   Airway Management Planned:   Additional Equipment:   Intra-op Plan:   Post-operative Plan:   Informed Consent: I have reviewed the patients History and Physical, chart, labs and discussed the procedure including the risks, benefits and alternatives for the proposed anesthesia with the patient or authorized representative who has indicated his/her understanding and acceptance.     Dental Advisory Given  Plan Discussed with: CRNA  Anesthesia Plan Comments:         Anesthesia Quick Evaluation

## 2020-10-21 NOTE — Transfer of Care (Signed)
Immediate Anesthesia Transfer of Care Note  Patient: Kelly Rollins  Procedure(s) Performed: ESOPHAGOGASTRODUODENOSCOPY (EGD) WITH PROPOFOL  Patient Location: PACU  Anesthesia Type:General  Level of Consciousness: sedated and drowsy  Airway & Oxygen Therapy: Patient Spontanous Breathing  Post-op Assessment: Report given to RN and Post -op Vital signs reviewed and stable  Post vital signs: Reviewed and stable  Last Vitals:  Vitals Value Taken Time  BP 122/80 10/21/20 1321  Temp    Pulse 83 10/21/20 1321  Resp 20 10/21/20 1321  SpO2 96 % 10/21/20 1321  Vitals shown include unvalidated device data.  Last Pain:  Vitals:   10/21/20 1245  TempSrc: Temporal  PainSc: 0-No pain         Complications: No notable events documented.

## 2020-10-21 NOTE — Anesthesia Procedure Notes (Signed)
Date/Time: 10/21/2020 1:05 PM Performed by: Johnna Acosta, CRNA Pre-anesthesia Checklist: Patient identified, Emergency Drugs available, Suction available, Patient being monitored and Timeout performed Patient Re-evaluated:Patient Re-evaluated prior to induction Oxygen Delivery Method: Nasal cannula Preoxygenation: Pre-oxygenation with 100% oxygen Induction Type: IV induction

## 2020-10-21 NOTE — Op Note (Signed)
[facility]astroenterology Patient Name: Kelly Rollins Procedure Date: 10/21/2020 1:02 PM MRN: [id number] Account #: [id number] Date of Birth: 09/14/1970 Admit Type: Inpatient Age: 50 Room: [facility] ENDO ROOM 3 Gender: Female Note Status: Finalized Instrument Name: Upper Endoscope [id number] Procedure:             Upper GI endoscopy Indications:           Abdominal pain in the left upper quadrant Providers:             Andrey Farmer MD, MD Referring MD:          [name] (Referring MD) Medicines:             Monitored Anesthesia Care Complications:         No immediate complications. Estimated blood loss:                         Minimal. Procedure:             Pre-Anesthesia Assessment:                        - Prior to the procedure, a History and Physical was                         performed, and patient medications and allergies were                         reviewed. The patient is competent. The risks and                         benefits of the procedure and the sedation options and                         risks were discussed with the patient. All questions                         were answered and informed consent was obtained.                         Patient identification and proposed procedure were                         verified by the physician, the nurse, the anesthetist                         and the technician in the endoscopy suite. Mental                         Status Examination: alert and oriented. Airway                         Examination: normal oropharyngeal airway and neck                         mobility. Respiratory Examination: clear to                         auscultation. CV Examination: normal. Prophylactic  Antibiotics: The patient does not require prophylactic                         antibiotics. Prior Anticoagulants: The patient has                         taken no previous anticoagulant or  antiplatelet                         agents. ASA Grade Assessment: II - A patient with mild                         systemic disease. After reviewing the risks and                         benefits, the patient was deemed in satisfactory                         condition to undergo the procedure. The anesthesia                         plan was to use monitored anesthesia care (MAC).                         Immediately prior to administration of medications,                         the patient was re-assessed for adequacy to receive                         sedatives. The heart rate, respiratory rate, oxygen                         saturations, blood pressure, adequacy of pulmonary                         ventilation, and response to care were monitored                         throughout the procedure. The physical status of the                         patient was re-assessed after the procedure.                        After obtaining informed consent, the endoscope was                         passed under direct vision. Throughout the procedure,                         the patient's blood pressure, pulse, and oxygen                         saturations were monitored continuously. The Endoscope                         was introduced through the mouth, and advanced to the  second part of duodenum. The upper GI endoscopy was                         accomplished without difficulty. The patient tolerated                         the procedure well. Findings:      A small hiatal hernia was present.      The exam of the esophagus was otherwise normal.      Patchy minimal inflammation characterized by erythema was found in the       gastric antrum. Biopsies were taken with a cold forceps for Helicobacter       pylori testing. Estimated blood loss was minimal.      The examined duodenum was normal. Impression:            - Small hiatal hernia.                        -  Gastritis. Biopsied.                        - Normal examined duodenum. Recommendation:        - Discharge patient to home.                        - Resume previous diet.                        - Continue present medications.                        - Await pathology results.                        - Return to referring physician as previously                         scheduled. Procedure Code(s):     --- Professional ---                        [phone], Esophagogastroduodenoscopy, flexible,                         transoral; with biopsy, single or multiple Diagnosis Code(s):     --- Professional ---                        K44.9, Diaphragmatic hernia without obstruction or                         gangrene                        K29.70, Gastritis, unspecified, without bleeding                        R10.12, Left upper quadrant pain CPT copyright 2019 American Medical Association. All rights reserved. The codes documented in this report are preliminary and upon coder review may  be revised to meet current compliance requirements. Andrey Farmer MD, MD 10/21/2020 1:16:13 PM Number of Addenda: 0 Note Initiated On: 10/21/2020 1:02 PM Estimated Blood Loss:  Estimated blood loss was minimal.      [facility]

## 2020-10-21 NOTE — H&P (Signed)
Outpatient short stay form Pre-procedure 10/21/2020  Lesly Rubenstein, MD  Primary Physician: [name], PA-C  Reason for visit:  LUQ pain  History of present illness:   50 y/o lady with history of DMII, HLD, and GERD here for EGD for LUQ pain. No blood thinners. No family history of GI malignancies. History of cholecystectomy.    Current Facility-Administered Medications:    0.9 %  sodium chloride infusion, , Intravenous, Continuous, Onia Shiflett, Hilton Cork, MD, Last Rate: 20 mL/hr at 10/21/20 1256, New Bag at 10/21/20 1256  Medications Prior to Admission  Medication Sig Dispense Refill Last Dose   amLODipine (NORVASC) 5 MG tablet Take 5 mg by mouth daily.   10/20/2020   glipiZIDE (GLUCOTROL) 5 MG tablet Take by mouth.   10/20/2020   metoprolol tartrate (LOPRESSOR) 50 MG tablet Take 25 mg by mouth daily.   10/21/2020   NOVOLOG MIX 70/30 (70-30) 100 UNIT/ML injection 30 u in am, 25 u in pm   10/20/2020   omeprazole (PRILOSEC OTC) 20 MG tablet Take 20 mg by mouth daily.   10/20/2020   rosuvastatin (CRESTOR) 10 MG tablet    10/20/2020   venlafaxine XR (EFFEXOR-XR) 37.5 MG 24 hr capsule Take 37.5 mg by mouth daily.  11 10/21/2020   ARTIFICIAL TEARS 1 % ophthalmic solution       HYDROcodone-acetaminophen (NORCO/VICODIN) 5-325 MG tablet       ibuprofen (ADVIL) 600 MG tablet Take 600 mg by mouth every 6 (six) hours as needed.      tiZANidine (ZANAFLEX) 4 MG tablet Take 1 tablet (4 mg total) by mouth every 8 (eight) hours as needed for muscle spasms. 90 tablet 2    traMADol (ULTRAM) 50 MG tablet Take 1 tablet (50 mg total) by mouth every 12 (twelve) hours as needed for severe pain. Must last 30 days 60 tablet 0      Allergies  Allergen Reactions   Atorvastatin     Other reaction(s): Muscle Pain   Levofloxacin Hives   Morphine Nausea And Vomiting    Other reaction(s): Vomiting REACTION: Headache REACTION: Headache   Penicillins Rash    Has patient had a PCN reaction causing  immediate rash, facial/tongue/throat swelling, SOB or lightheadedness with hypotension: Yes Has patient had a PCN reaction causing severe rash involving mucus membranes or skin necrosis: No Has patient had a PCN reaction that required hospitalization: No Has patient had a PCN reaction occurring within the last 10 years: No. If all of the above answers are "NO", then may proceed with Cephalosporin use.     Past Medical History:  Diagnosis Date   ABDOMINAL PAIN OTHER SPECIFIED SITE 02/21/2009   Qualifier: Diagnosis of  By: Deborra Medina MD, Talia     ABDOMINAL PAIN-LUQ 04/17/2009   Qualifier: Diagnosis of  By: Fuller Plan MD Lamont Snowball T    Anal fissure    Anginal pain ([place])    Asthma    Depression    Diabetes mellitus without complication ([place])    Dyspnea    Dysrhythmia    Headache(784.0)    HEMATOCHEZIA 04/17/2009   Qualifier: Diagnosis of  By: Fuller Plan MD Lamont Snowball T    Hyperlipidemia    Hypertension    IBS (irritable bowel syndrome)    Migraine headache 11/06/2008   Qualifier: Diagnosis of  By: Deborra Medina MD, Talia     OVARIAN CYST, RIGHT 02/21/2009   Qualifier: Diagnosis of  By: Deborra Medina MD, Talia     PALPITATIONS, OCCASIONAL 04/17/2010  Qualifier: Diagnosis of  By: Deborra Medina MD, Talia     Pancreatitis 10/14/2017    Review of systems:  Otherwise negative.    Physical Exam  Gen: Alert, oriented. Appears stated age.  HEENT: PERRLA. Lungs: No respiratory distress CV: RRR Abd: soft, benign, no masses Ext: No edema    Planned procedures: Proceed with EGD. The patient understands the nature of the planned procedure, indications, risks, alternatives and potential complications including but not limited to bleeding, infection, perforation, damage to internal organs and possible oversedation/side effects from anesthesia. The patient agrees and gives consent to proceed.  Please refer to procedure notes for findings, recommendations and patient disposition/instructions.     Lesly Rubenstein,  MD [facility]

## 2020-10-21 NOTE — Interval H&P Note (Signed)
History and Physical Interval Note:  10/21/2020 1:00 PM  Kelly Rollins  has presented today for surgery, with the diagnosis of GERD.  The various methods of treatment have been discussed with the patient and family. After consideration of risks, benefits and other options for treatment, the patient has consented to  Procedure(s): ESOPHAGOGASTRODUODENOSCOPY (EGD) WITH PROPOFOL (N/A) as a surgical intervention.  The patient's history has been reviewed, patient examined, no change in status, stable for surgery.  I have reviewed the patient's chart and labs.  Questions were answered to the patient's satisfaction.     Lesly Rubenstein  Ok to proceed with EGD

## 2020-10-22 ENCOUNTER — Encounter: Payer: Self-pay | Admitting: Gastroenterology

## 2020-10-22 LAB — SURGICAL PATHOLOGY

## 2020-10-28 NOTE — Anesthesia Postprocedure Evaluation (Signed)
Anesthesia Post Note  Patient: Kelly Rollins  Procedure(s) Performed: ESOPHAGOGASTRODUODENOSCOPY (EGD) WITH PROPOFOL  Patient location during evaluation: PACU Anesthesia Type: General Level of consciousness: awake and alert Pain management: pain level controlled Vital Signs Assessment: post-procedure vital signs reviewed and stable Respiratory status: spontaneous breathing, nonlabored ventilation, respiratory function stable and patient connected to nasal cannula oxygen Cardiovascular status: blood pressure returned to baseline and stable Postop Assessment: no apparent nausea or vomiting Anesthetic complications: no   No notable events documented.   Last Vitals:  Vitals:   10/21/20 1320 10/21/20 1321  BP: 121/81 122/80  Pulse:  80  Resp:  (!) 22  Temp: 36.8 C   SpO2:  96%    Last Pain:  Vitals:   10/21/20 1330  TempSrc:   PainSc: 0-No pain                 Molli Barrows

## 2021-02-06 ENCOUNTER — Emergency Department
Admission: EM | Admit: 2021-02-06 | Discharge: 2021-02-06 | Disposition: A | Discharge status: Home / Self Care | Payer: Medicare Other | Attending: Emergency Medicine | Admitting: Emergency Medicine

## 2021-02-06 ENCOUNTER — Other Ambulatory Visit: Payer: Self-pay | Admitting: Family Medicine

## 2021-02-06 ENCOUNTER — Encounter: Payer: Self-pay | Admitting: *Deleted

## 2021-02-06 ENCOUNTER — Other Ambulatory Visit: Payer: Self-pay

## 2021-02-06 ENCOUNTER — Emergency Department
Admission: RE | Admit: 2021-02-06 | Discharge: 2021-02-06 | Disposition: A | Discharge status: Home / Self Care | Payer: Medicare Other | Source: Ambulatory Visit | Attending: Family Medicine | Admitting: Family Medicine

## 2021-02-06 ENCOUNTER — Emergency Department: Payer: Medicare Other

## 2021-02-06 DIAGNOSIS — R531 Weakness: Secondary | ICD-10-CM

## 2021-02-06 DIAGNOSIS — N9489 Other specified conditions associated with female genital organs and menstrual cycle: Secondary | ICD-10-CM | POA: Diagnosis not present

## 2021-02-06 DIAGNOSIS — R739 Hyperglycemia, unspecified: Secondary | ICD-10-CM | POA: Diagnosis not present

## 2021-02-06 DIAGNOSIS — D496 Neoplasm of unspecified behavior of brain: Secondary | ICD-10-CM | POA: Diagnosis not present

## 2021-02-06 LAB — CBC WITH DIFFERENTIAL/PLATELET
Abs Immature Granulocytes: 0.03 10*3/uL (ref 0.00–0.07)
Basophils Absolute: 0.1 10*3/uL (ref 0.0–0.1)
Basophils Relative: 1 %
Eosinophils Absolute: 0.1 10*3/uL (ref 0.0–0.5)
Eosinophils Relative: 1 %
HCT: 40.8 % (ref 36.0–46.0)
Hemoglobin: 14 g/dL (ref 12.0–15.0)
Immature Granulocytes: 0 %
Lymphocytes Relative: 28 %
Lymphs Abs: 3 10*3/uL (ref 0.7–4.0)
MCH: 30 pg (ref 26.0–34.0)
MCHC: 34.3 g/dL (ref 30.0–36.0)
MCV: 87.4 fL (ref 80.0–100.0)
Monocytes Absolute: 0.6 10*3/uL (ref 0.1–1.0)
Monocytes Relative: 6 %
Neutro Abs: 7.1 10*3/uL (ref 1.7–7.7)
Neutrophils Relative %: 64 %
Platelets: 299 10*3/uL (ref 150–400)
RBC: 4.67 MIL/uL (ref 3.87–5.11)
RDW: 12.7 % (ref 11.5–15.5)
WBC: 10.9 10*3/uL — ABNORMAL HIGH (ref 4.0–10.5)
nRBC: 0 % (ref 0.0–0.2)

## 2021-02-06 LAB — BASIC METABOLIC PANEL
Anion gap: 8 (ref 5–15)
BUN: 13 mg/dL (ref 6–20)
CO2: 27 mmol/L (ref 22–32)
Calcium: 9.6 mg/dL (ref 8.9–10.3)
Chloride: 102 mmol/L (ref 98–111)
Creatinine, Ser: 0.75 mg/dL (ref 0.44–1.00)
GFR, Estimated: >60 mL/min (ref >60)
Glucose, Bld: 177 mg/dL — ABNORMAL HIGH (ref 70–99)
Potassium: 4.2 mmol/L (ref 3.5–5.1)
Sodium: 137 mmol/L (ref 135–145)

## 2021-02-06 LAB — HCG, QUANTITATIVE, PREGNANCY: hCG, Beta Chain, Quant, S: 3 m[IU]/mL (ref <5)

## 2021-02-06 MED ORDER — BUTALBITAL-APAP-CAFFEINE 50-325-40 MG PO TABS
2.0000 | ORAL_TABLET | Freq: Once | ORAL | Status: AC
  Administered 2021-02-06: 2 via ORAL
  Filled 2021-02-06: qty 2

## 2021-02-06 MED ORDER — ONDANSETRON 4 MG PO TBDP: 4.0000 mg | ORAL_TABLET | Freq: Three times a day (TID) | ORAL | 0 refills | Status: AC | PRN

## 2021-02-06 MED ORDER — DEXAMETHASONE SODIUM PHOSPHATE 10 MG/ML IJ SOLN
6.0000 mg | Freq: Once | INTRAMUSCULAR | Status: AC
  Administered 2021-02-06: 6 mg via INTRAVENOUS
  Filled 2021-02-06: qty 1

## 2021-02-06 MED ORDER — BUTALBITAL-APAP-CAFFEINE 50-325-40 MG PO TABS: 1.0000 | ORAL_TABLET | Freq: Four times a day (QID) | ORAL | 0 refills | Status: AC | PRN

## 2021-02-06 MED ORDER — GADOBUTROL 1 MMOL/ML IV SOLN
8.0000 mL | Freq: Once | INTRAVENOUS | Status: AC | PRN
  Administered 2021-02-06: 8 mL via INTRAVENOUS

## 2021-02-06 MED ORDER — DEXAMETHASONE 4 MG PO TABS: 4.0000 mg | ORAL_TABLET | Freq: Two times a day (BID) | ORAL | 0 refills | Status: AC

## 2021-02-06 MED ORDER — MORPHINE SULFATE (PF) 4 MG/ML IV SOLN
4.0000 mg | Freq: Once | INTRAVENOUS | Status: AC
  Administered 2021-02-06: 4 mg via INTRAVENOUS
  Filled 2021-02-06: qty 1

## 2021-02-06 MED ORDER — OXYCODONE HCL 5 MG PO TABS: 5.0000 mg | ORAL_TABLET | Freq: Three times a day (TID) | ORAL | 0 refills | Status: AC | PRN

## 2021-02-06 NOTE — ED Triage Notes (Signed)
Pt sent to er for eval of right side weakness in arm and leg that started on Christmas.  Pt reports a headache.  No n/v   pt fell yesterday when losing balance.   pt alert  speech clear

## 2021-02-06 NOTE — ED Provider Notes (Signed)
[facility] Provider Note    Event Date/Time   First MD Initiated Contact with Patient 02/06/21 1931     (approximate)   History   Weakness   HPI  Kelly Rollins is a 51 y.o. female who presents to the ED for evaluation of Weakness Review PCP visit from earlier today where patient presented with right-sided weakness since about Christmas and was referred to an outpatient CT scan, which I also review, demonstrating new intracranial mass.  Patient presents to the ED, accompanied by her husband, for evaluation of right-sided weakness progressively worsening over a subacute timeframe.  She reports around Christmas she noted that her handwriting was worse on the right side when she was working on presents.  She reports progressive worsening of her right-sided weakness, dysmetria and poor coordination.  She has had to stop driving because her depth perception is off.  She did not think it was a stroke and did not think it was emergent, so he did not come to get checked out immediately.  Ultimately called her PCP PA and got the appointment this morning, and she was referred to [place].  Reports poorly controlled headache that is novel for her.  Physical Exam   Triage Vital Signs: ED Triage Vitals  Enc Vitals Group     BP 02/06/21 1515 (!) 138/95     Pulse Rate 02/06/21 1515 78     Resp 02/06/21 1515 18     Temp 02/06/21 1515 98.2 F (36.8 C)     Temp Source 02/06/21 1515 Oral     SpO2 02/06/21 1515 99 %     Weight 02/06/21 1513 179 lb (81.2 kg)     Height 02/06/21 1513 5\' 4"  (1.626 m)     Head Circumference --      Peak Flow --      Pain Score 02/06/21 1513 3     Pain Loc --      Pain Edu? --      Excl. in [place]? --     Most recent vital signs: Vitals:   02/06/21 1515 02/06/21 2000  BP: (!) 138/95 (!) 145/86  Pulse: 78 72  Resp: 18 19  Temp: 98.2 F (36.8 C)   SpO2: 99% 98%    General: Awake, no distress.  Appears uncomfortable.  Becomes quite  tearful, understandably, when I describe MRI results. CV:  Good peripheral perfusion.  Resp:  Normal effort.  Abd:  No distention.  MSK:  No deformity noted.  Neuro:  Right-sided weakness and dysmetria is present.  Sensation is decreased on the right side as well.  4/5 strength on the right.  Full sensation and strength on the left. Other:     ED Results / Procedures / Treatments   Labs (all labs ordered are listed, but only abnormal results are displayed) Labs Reviewed  CBC WITH DIFFERENTIAL/PLATELET - Abnormal; Notable for the following components:      Result Value   WBC 10.9 (*)    All other components within normal limits  BASIC METABOLIC PANEL - Abnormal; Notable for the following components:   Glucose, Bld 177 (*)    All other components within normal limits  HCG, QUANTITATIVE, PREGNANCY    EKG    RADIOLOGY CT head reviewed by me with intracranial mass  Official radiology report(s): CT HEAD WO CONTRAST (5MM)  Result Date: 02/06/2021 CLINICAL DATA:  Right sided weakness R53.1 (ICD-10-CM) EXAM: CT HEAD WITHOUT CONTRAST TECHNIQUE: Contiguous axial images were  obtained from the base of the skull through the vertex without intravenous contrast. COMPARISON:  CT head 10/14/2017. FINDINGS: Brain: High left anterior frontal hypoattenuation and possible loss of gray differentiation. Sulcal effacement without midline shift. Adjacent masslike hyperdensity involving the left frontal white matter, high left frontal cortex, and genu of corpus callosum (see series 2, images 17 through 23). Which appears new. No mass occupying intraparenchymal hemorrhage. No hydrocephalus. No extra-axial fluid collection. Vascular: No hyperdense vessel identified. Skull: No evidence of acute fracture. Sinuses/Orbits: Visualized sinuses are clear.  Unremarkable orbits. Other: No mastoid effusions. IMPRESSION: High left anterior frontal edema with new adjacent masslike hyperdensity involving the left frontal  white matter, high left left frontal cortex, and genu of corpus callosum. Overall, findings are suspicious for malignancy (including hypercellular tumors such as GBM and lymphoma). Acute or subacute left ACA territory infarct is a differential consideration, but thought less likely. Recommend MRI with contrast for further evaluation. These results will be called to the ordering clinician or representative by the Radiologist Assistant, and communication documented in the PACS or [employer]. Electronically Signed   By: Margaretha Sheffield M.D.   On: 02/06/2021 14:23   MR Brain W and Wo Contrast  Result Date: 02/06/2021 CLINICAL DATA:  Mass on recent head CT. EXAM: MRI HEAD WITHOUT AND WITH CONTRAST TECHNIQUE: Multiplanar, multiecho pulse sequences of the brain and surrounding structures were obtained without and with intravenous contrast. CONTRAST:  1mL GADAVIST GADOBUTROL 1 MMOL/ML IV SOLN COMPARISON:  No prior MRI, correlation is made with 02/06/2021 CT head and 10/14/2017 CT head FINDINGS: Brain: No definite restricted diffusion to suggest acute or subacute infarct. Increased T2 signal is seen in multiple areas in the left frontal lobe, most prominently in the anterior left frontal lobe (series 15, image 43), where it is mildly expansile, masslike, and measures approximately 3.4 x 2.0 x 2.8 cm (series 15, image 43 and series 17, image 12). Additional areas of increased T2 signal more posteriorly in the left frontal lobe (series 15, image 36, 38, and 39), which are not as prominent but do seem somewhat masslike and expansile. No acute hemorrhage. No midline shift. No hydrocephalus or extra-axial collection. Vascular: Normal flow voids. Skull and upper cervical spine: Normal marrow signal. Sinuses/Orbits: Negative. Other: None. 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 Additional areas of increased T2 signal in the  left frontal lobe may also infiltrative low-grade glioma or associated edema. Electronically Signed   By: Merilyn Baba M.D.   On: 02/06/2021 19:17    PROCEDURES and INTERVENTIONS:  Procedures  Medications  gadobutrol (GADAVIST) 1 MMOL/ML injection 8 mL (8 mLs Intravenous Contrast Given 02/06/21 1842)  morphine 4 MG/ML injection 4 mg (4 mg Intravenous Given 02/06/21 2022)  butalbital-acetaminophen-caffeine (FIORICET) 50-325-40 MG per tablet 2 tablet (2 tablets Oral Given 02/06/21 2022)  dexamethasone (DECADRON) injection 6 mg (6 mg Intravenous Given 02/06/21 2030)     IMPRESSION / MDM / ASSESSMENT AND PLAN / ED COURSE  I reviewed the triage vital signs and the nursing notes.  51 year old female presents to the ED with subacute worsening right-sided weakness with evidence of a new brain tumor suitable for outpatient management with surgical follow-up.  She has right-sided weakness, likely compressive from this new mass.  CT and follow-up MRI obtained, concerning for a primary brain malignancy.  Blood work with minimal leukocytosis, normal metabolic panel.  No negation for antibiotics, no signs of sepsis  or systemic illness.  I consult with neurosurgery who reviews imaging and recommends initiation of a steroid course and close follow-up for biopsy.  We discussed analgesia at home and return precautions for the ED.  Patient stable for outpatient management.  Clinical Course as of 02/06/21 2157  Thu Feb 06, 2021  2016 I discuss w Dr Lacinda Axon [DS]  2018 Steroids  [DS]  2019 And biopsy and [place] 4mg  bid [DS]    Clinical Course User Index [DS] Vladimir Crofts, MD     FINAL CLINICAL IMPRESSION(S) / ED DIAGNOSES   Final diagnoses:  Brain tumor [facility])     Rx / DC Orders   ED Discharge Orders          Ordered    butalbital-acetaminophen-caffeine (FIORICET) 50-325-40 MG tablet  Every 6 hours PRN        02/06/21 2051    oxyCODONE (ROXICODONE) 5 MG immediate release tablet  Every 8 hours PRN         02/06/21 2051    ondansetron (ZOFRAN-ODT) 4 MG disintegrating tablet  Every 8 hours PRN        02/06/21 2051    dexamethasone (DECADRON) 4 MG tablet  2 times daily with meals        02/06/21 2051             Note:  This document was prepared using Dragon voice recognition software and may include unintentional dictation errors.   Vladimir Crofts, MD 02/06/21 2158

## 2021-02-06 NOTE — Discharge Instructions (Addendum)
Please take Decadron steroids twice daily until you see Dr. Lacinda Axon in the clinic.  Discussed continuing this medication with them.  Use Tylenol for pain and fevers.  Up to 1000 mg per dose, up to 4 times per day.  Do not take more than 4000 mg of Tylenol/acetaminophen within 24 hours..  Use the Fioricet medication for moderate/severe headaches  Take 2 at a time.,  Every 6 hours.  Each pill has 325 mg of Tylenol, so be careful mixing plain Tylenol with this medication.  You are also being discharged with a prescription for oxycodone for more severe/breakthrough pain.   I would recommend that you avoid NSAID medications for your pain.  This includes ibuprofen, Advil, Aleve and naproxen.  BC and Goody's powders.  The clinic of the neurosurgeon, Dr. Lacinda Axon, should call you guys tomorrow to discuss being seen in the clinic and having a biopsy done.  If you develop any fevers or severely worsening symptoms of your weakness, passing out, please return to the ED.  You are being discharged with a prescription for Zofran to use as needed only for nausea.

## 2021-08-04 ENCOUNTER — Ambulatory Visit: Payer: Medicare Other | Attending: Medical Oncology | Admitting: Occupational Therapy

## 2021-08-04 ENCOUNTER — Encounter: Payer: Self-pay | Admitting: Occupational Therapy

## 2021-08-04 DIAGNOSIS — R4701 Aphasia: Secondary | ICD-10-CM | POA: Insufficient documentation

## 2021-08-04 DIAGNOSIS — C719 Malignant neoplasm of brain, unspecified: Secondary | ICD-10-CM | POA: Insufficient documentation

## 2021-08-04 DIAGNOSIS — M6281 Muscle weakness (generalized): Secondary | ICD-10-CM | POA: Diagnosis present

## 2021-08-04 DIAGNOSIS — R41841 Cognitive communication deficit: Secondary | ICD-10-CM | POA: Diagnosis present

## 2021-08-04 DIAGNOSIS — R278 Other lack of coordination: Secondary | ICD-10-CM | POA: Diagnosis present

## 2021-08-04 NOTE — Therapy (Signed)
OUTPATIENT OCCUPATIONAL THERAPY NEURO EVALUATION  Patient Name: Kelly Rollins MRN: [id number] DOB:07-29-1970, 51 y.o., female Today's Date: 08/04/2021  PCP: [name], PA-C REFERRING PROVIDER: [name], Corrine Jeani Hawking, PA-C   OT End of Session - 08/04/21 1606     Visit Number 1    Number of Visits 24    Date for OT Re-Evaluation 10/27/21    OT Start Time 1600    OT Stop Time 1700    OT Time Calculation (min) 60 min    Activity Tolerance Patient tolerated treatment well    Behavior During Therapy [facility] for tasks assessed/performed             Past Medical History:  Diagnosis Date   ABDOMINAL PAIN OTHER SPECIFIED SITE 02/21/2009   Qualifier: Diagnosis of  By: Deborra Medina MD, Talia     ABDOMINAL PAIN-LUQ 04/17/2009   Qualifier: Diagnosis of  By: Fuller Plan MD Lamont Snowball T    Anal fissure    Anginal pain ([place])    Asthma    Depression    Diabetes mellitus without complication ([place])    Dyspnea    Dysrhythmia    Headache(784.0)    HEMATOCHEZIA 04/17/2009   Qualifier: Diagnosis of  By: Fuller Plan MD Lamont Snowball T    Hyperlipidemia    Hypertension    IBS (irritable bowel syndrome)    Migraine headache 11/06/2008   Qualifier: Diagnosis of  By: Deborra Medina MD, Talia     OVARIAN CYST, RIGHT 02/21/2009   Qualifier: Diagnosis of  By: Deborra Medina MD, Talia     PALPITATIONS, OCCASIONAL 04/17/2010   Qualifier: Diagnosis of  By: Deborra Medina MD, Talia     Pancreatitis 10/14/2017   Past Surgical History:  Procedure Laterality Date   BREAST EXCISIONAL BIOPSY Left 1997   ? left side, done at time of reduction , benign   BREAST REDUCTION SURGERY     bilateral   CESAREAN SECTION     CHOLECYSTECTOMY     COLONOSCOPY     ESOPHAGOGASTRODUODENOSCOPY     ESOPHAGOGASTRODUODENOSCOPY (EGD) WITH PROPOFOL N/A 10/21/2020   Procedure: ESOPHAGOGASTRODUODENOSCOPY (EGD) WITH PROPOFOL;  Surgeon: Lesly Rubenstein, MD;  Location: [facility] ENDOSCOPY;  Service: Endoscopy;  Laterality: N/A;   REDUCTION MAMMAPLASTY  Bilateral 1997   VAGINAL HYSTERECTOMY     Patient Active Problem List   Diagnosis Date Noted   Abnormal MRI, lumbar spine (01/29/2020) 02/19/2020   Anxiety due to MRI 01/25/2020   Claustrophobia associated to MRI 01/25/2020   Enthesopathy of knee region (Right) 01/25/2020   Enthesopathy of knee region (Left) 01/25/2020   Lumbar facet hypertrophy 01/25/2020   Lumbosacral radiculopathy at S1 (Left) 01/17/2020   Lumbosacral radiculopathy (S1) (Left) 01/17/2020   Chronic lower extremity pain (Bilateral) (L>R) 01/17/2020   History of pancreatitis 07/18/2019   Obstructive sleep apnea syndrome 01/02/2019   Lumbar facet arthropathy (Multilevel) (Bilateral) 12/14/2018   Osteoarthritis involving multiple joints 11/28/2018   Chronic hip pain (Left) 11/16/2018   Acute hip pain (Left) 11/16/2018   DDD (degenerative disc disease), lumbosacral 11/16/2018   Chronic hip pain (Secondary source of pain) (Bilateral) (L>R) 05/19/2017   Elevated C-reactive protein (CRP) 05/19/2017   Elevated sed rate 05/19/2017   Spondylosis without myelopathy or radiculopathy, lumbosacral region 05/19/2017   Pharmacologic therapy 05/06/2017   Disorder of skeletal system 05/06/2017   Problems influencing health status 05/06/2017   Chest pain with moderate risk of acute coronary syndrome 04/21/2017   DOE (dyspnea on exertion) 04/21/2017   Intermittent palpitations 04/21/2017  Vitamin D insufficiency 05/04/2016   Depression 02/04/2016   Fatty liver 02/04/2016   Hypertension associated with diabetes ([place]) 02/04/2016   Chronic pain syndrome 01/15/2016   Acute low back pain 05/28/2015   Lumbar transverse process fracture (HCC) (Left L1) (03/11/15) 05/28/2015   Long term current use of opiate analgesic 05/01/2015   Long term prescription opiate use 05/01/2015   Opiate use (25 MME/Day) 05/01/2015   Encounter for therapeutic drug level monitoring 05/01/2015   Encounter for pain management planning 05/01/2015   Chronic  low back pain (Primary Source of Pain) (Bilateral) (L>R) 05/01/2015   Lumbar facet syndrome (Bilateral) (L>R) 05/01/2015   Chronic knee pain [facility] source of pain) (Bilateral) (L>R) 05/01/2015   Lumbar spondylosis 05/01/2015   Neuropathic pain 05/01/2015   Neurogenic pain 05/01/2015   Myofascial pain 05/01/2015   Essential hypertriglyceridemia 04/26/2014   Type 2 diabetes mellitus with hyperglycemia, with long-term current use of insulin ([place]) 04/26/2014   Anxiety 05/15/2010   Obesity 05/15/2010   Hyperlipidemia 04/03/2010   Fatty liver disease 04/02/2010   Pure hypercholesterolemia 03/19/2010   GERD 04/17/2009   Constipation 04/17/2009   TOBACCO ABUSE 11/06/2008    ONSET DATE: 02/06/2021  REFERRING DIAG: Astrocytoma  THERAPY DIAG:  Muscle weakness (generalized)  Rationale for Evaluation and Treatment Rehabilitation  SUBJECTIVE:   SUBJECTIVE STATEMENT: Pt. Was present with her husband Pt accompanied by: significant other  PERTINENT HISTORY:  Pt. Is a 51 y.o. 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 PMHx includes: Anorexia, dehydration, Adult Failure to thrive, contracture of muscles multiple sites. Pt./caregiver reports recently being hospitalized with a UTI, and dehydration.   PRECAUTIONS: Fall  WEIGHT BEARING RESTRICTIONS No  PAIN:  Are you having pain?  Pt. Reports 4/10 pain in the left shoulder at rest.  Severe pain, and guarding with attempts for movement  FALLS: Has patient fallen in last 6 months? Yes. Number of falls Multiple Falls  LIVING ENVIRONMENT: Lives with: Family Lives in: House/apartment Stairs: Yes: External: 4 steps; bilateral but cannot reach both Has following equipment at home: Single point cane, Walker - 4 wheeled, Hemi walker, shower chair, bed side commode, and Grab bars Lift chair in the tub, [place],  PLOF: Independent  PATIENT GOALS: To improve  the right UE  OBJECTIVE:   HAND DOMINANCE: Right  ADLs: Transfers/ambulation related to ADLs: Eating: MaxA with cutting food, setting up, Modified I with nondominant Left hand, Silicon Mat  Grooming: Independent  UB Dressing: MaxA  LB Dressing: MaxA Toileting: Uses a BSCommode mInA transfers, MaxA clothing negotiation Bathing: MaxA Tub Shower transfers:  Uses a shower lift chair Equipment:  w/c, BSCommode, cane   IADLs: Shopping: Husband performs Light housekeeping: Max Meal Prep: MaxA Community mobility: Relies on family Medication management:Husband assists Doctor, hospital: Performs together Handwriting: Not legible  MOBILITY STATUS: Hx of falls    ACTIVITY TOLERANCE: Activity tolerance: limited  FUNCTIONAL OUTCOME MEASURES: FOTO: 11.76  UPPER EXTREMITY ROM  : WFL Left AROM/PROM   Passive ROM Right eval Left eval  Shoulder flexion 30   Shoulder abduction 40   Shoulder adduction    Shoulder extension    Shoulder internal rotation    Shoulder external rotation    Elbow flexion 120   Elbow extension -67   Wrist flexion Neutral   Wrist extension 12   Wrist ulnar deviation    Wrist radial deviation    Wrist pronation  Wrist supination    (Blank rows = not tested)   UPPER EXTREMITY MMT:     MMT Right eval Left eval  Shoulder flexion 0/5 4+/5  Shoulder abduction 0/5 4+/5  Shoulder adduction    Shoulder extension    Shoulder internal rotation    Shoulder external rotation    Middle trapezius    Lower trapezius    Elbow flexion 0/5 4+/5  Elbow extension 0/5 4+/5  Wrist flexion    Wrist extension 0/5 4+/5  Wrist ulnar deviation    Wrist radial deviation    Wrist pronation    Wrist supination    (Blank rows = not tested)    SENSATION: Light touch: WFL  EDEMA:   Wrist: 18.5 cm MCP: 21.5 cm    MUSCLE TONE: RUE: Severe  COGNITION: Overall cognitive status: Impaired  VISION: Subjective report: Wears glasses Baseline vision:  Wears glasses all the time Pt. Reports no changes    PRAXIS: Impaired: Motor planning     TODAY'S TREATMENT:   The Initial evaluation, FOTO assessment   PATIENT EDUCATION: Education details: OT services, POC, Goals Person educated: Patient and Spouse Education method: Explanation, Demonstration, Tactile cues, and Verbal cues Education comprehension: verbalized understanding, returned demonstration, and needs further education   HOME EXERCISE PROGRAM:   Pt./caregiver education about positioning of the RUE, ROM    GOALS: Goals reviewed with patient? Yes  SHORT TERM GOALS: Target date: 09/15/2021    Pt./caregivers with demonstrate independence with HEPs for UE ROM, and edema control Baseline: No current HEPs Goal status: INITIAL    LONG TERM GOALS: Target date: 10/27/2021    Pt. Will improve right hand edema by 1 cm at the wrist, and MCPs Baseline: Eval: Wrist: 18.5cm, MCPs: 21.5cm Goal status: INITIAL  2.  Pt. Will improve right shoulder PROM by 10 degrees with ADLs Baseline: Eval: PROM shoulder flexion: 30 degrees, Abduction: 40 degrees Goal status: INITIAL  3.  Pt. Will improve right elbow PROM by 10 degrees to prepare for hand to mouth patterns Baseline: Eval:   elbow extension -67, flexion 120 Goal status: INITIAL  4.  Pt. Will improve PROM right wrist flexion, and extension by 10 degrees Baseline: Eval: wrist extension 12 degrees, flexion to neutral Goal status: INITIAL  5.  Pt. Will tolerate full digit extension in the right hand to promote good skin integrity through the palmar surface of the right hand  Baseline: Pt. Maintains digits in tight flexion Goal status: INITIAL  6.  Pt. Will require minA UE dressing using one armed dressing techniques. Baseline: MaxA Goal status: INITIAL  ASSESSMENT:  CLINICAL IMPRESSION:  Patient is a 51 y.o. female who was seen today for an occupational therapy evaluation. Pt. Presents  with severe pain in the  right UE and hand limiting all PROM attempts. Pt. Also presents with flexor tightness in the right shoulder and digits, edema in the right hand, impaired safety awareness and judgment, a history of  multiple falls, and decreased balance which limit her ability to engage in ADL, and IADL tasks, and increases her risk of further falls. Pt. will benefit from OT services to work on improving UE edema, pain, ROM, positioning, UE ADL tasks, and providing pt/caregiver education about RUE functioning and care in order to work towards improving engagement of the RUE during ADLs, and IADL tasks, and promote good skin integrity through the palmar surface of the right hand.  PERFORMANCE DEFICITS in functional skills including ADLs, IADLs, coordination, proprioception, sensation, edema,  tone, ROM, strength, pain, FMC, GMC, balance, decreased knowledge of use of DME, skin integrity, and UE functional use, cognitive skills including attention, memory, and safety awareness, and psychosocial skills including coping strategies.   IMPAIRMENTS are limiting patient from ADLs, IADLs, education, work, leisure, and social participation.   COMORBIDITIES may have co-morbidities  that affects occupational performance. Patient will benefit from skilled OT to address above impairments and improve overall function.  MODIFICATION OR ASSISTANCE TO COMPLETE EVALUATION: Maximum modification of tasks or assist with assess necessary to complete an evaluation.  OT OCCUPATIONAL PROFILE AND HISTORY: Detailed assessment: Review of records and additional review of physical, cognitive, psychosocial history related to current functional performance.  CLINICAL DECISION MAKING: High - multiple treatment options, significant modification of task necessary  REHAB POTENTIAL: Good for stated goals  EVALUATION COMPLEXITY: High    PLAN: OT FREQUENCY: 2x/week  OT DURATION: 12 weeks  PLANNED INTERVENTIONS: self care/ADL training, therapeutic  exercise, therapeutic activity, neuromuscular re-education, manual therapy, passive range of motion, paraffin, moist heat, contrast bath, patient/family education, cognitive remediation/compensation, energy conservation, and coping strategies training  RECOMMENDED OTHER SERVICES: PT, and ST  CONSULTED AND AGREED WITH PLAN OF CARE: Patient and family member/caregiver  PLAN FOR NEXT SESSION: Initiate PROM, and RUE Edema control techniques   Harrel Carina, MS, OTR/L  Harrel Carina, OT 08/04/2021, 4:13 PM

## 2021-08-06 ENCOUNTER — Ambulatory Visit: Payer: Medicare Other | Admitting: Occupational Therapy

## 2021-08-06 DIAGNOSIS — M6281 Muscle weakness (generalized): Secondary | ICD-10-CM

## 2021-08-07 ENCOUNTER — Encounter: Payer: Self-pay | Admitting: Occupational Therapy

## 2021-08-07 NOTE — Therapy (Signed)
OUTPATIENT OCCUPATIONAL THERAPY NEURO EVALUATION  Patient Name: Kelly Rollins MRN: [id number] DOB:[birth year], 51 y.o., female Today's Date: 08/07/2021  PCP: [name], PA-C REFERRING PROVIDER: [name], Corrine Jeani Hawking, PA-C   OT End of Session - 08/07/21 0902     Visit Number 2    Date for OT Re-Evaluation 10/27/21    OT Start Time 1600    OT Stop Time 1645    OT Time Calculation (min) 45 min    Activity Tolerance Patient tolerated treatment well    Behavior During Therapy [facility] for tasks assessed/performed             Past Medical History:  Diagnosis Date   ABDOMINAL PAIN OTHER SPECIFIED SITE 02/21/2009   Qualifier: Diagnosis of  By: Deborra Medina MD, Talia     ABDOMINAL PAIN-LUQ 04/17/2009   Qualifier: Diagnosis of  By: Fuller Plan MD Lamont Snowball T    Anal fissure    Anginal pain ([place])    Asthma    Depression    Diabetes mellitus without complication ([place])    Dyspnea    Dysrhythmia    Headache(784.0)    HEMATOCHEZIA 04/17/2009   Qualifier: Diagnosis of  By: Fuller Plan MD Lamont Snowball T    Hyperlipidemia    Hypertension    IBS (irritable bowel syndrome)    Migraine headache 11/06/2008   Qualifier: Diagnosis of  By: Deborra Medina MD, Talia     OVARIAN CYST, RIGHT 02/21/2009   Qualifier: Diagnosis of  By: Deborra Medina MD, Talia     PALPITATIONS, OCCASIONAL 04/17/2010   Qualifier: Diagnosis of  By: Deborra Medina MD, Talia     Pancreatitis 10/14/2017   Past Surgical History:  Procedure Laterality Date   BREAST EXCISIONAL BIOPSY Left 1997   ? left side, done at time of reduction , benign   BREAST REDUCTION SURGERY     bilateral   CESAREAN SECTION     CHOLECYSTECTOMY     COLONOSCOPY     ESOPHAGOGASTRODUODENOSCOPY     ESOPHAGOGASTRODUODENOSCOPY (EGD) WITH PROPOFOL N/A 10/21/2020   Procedure: ESOPHAGOGASTRODUODENOSCOPY (EGD) WITH PROPOFOL;  Surgeon: Lesly Rubenstein, MD;  Location: [facility] ENDOSCOPY;  Service: Endoscopy;  Laterality: N/A;   REDUCTION MAMMAPLASTY Bilateral 1997   VAGINAL  HYSTERECTOMY     Patient Active Problem List   Diagnosis Date Noted   Abnormal MRI, lumbar spine (01/29/2020) 02/19/2020   Anxiety due to MRI 01/25/2020   Claustrophobia associated to MRI 01/25/2020   Enthesopathy of knee region (Right) 01/25/2020   Enthesopathy of knee region (Left) 01/25/2020   Lumbar facet hypertrophy 01/25/2020   Lumbosacral radiculopathy at S1 (Left) 01/17/2020   Lumbosacral radiculopathy (S1) (Left) 01/17/2020   Chronic lower extremity pain (Bilateral) (L>R) 01/17/2020   History of pancreatitis 07/18/2019   Obstructive sleep apnea syndrome 01/02/2019   Lumbar facet arthropathy (Multilevel) (Bilateral) 12/14/2018   Osteoarthritis involving multiple joints 11/28/2018   Chronic hip pain (Left) 11/16/2018   Acute hip pain (Left) 11/16/2018   DDD (degenerative disc disease), lumbosacral 11/16/2018   Chronic hip pain (Secondary source of pain) (Bilateral) (L>R) 05/19/2017   Elevated C-reactive protein (CRP) 05/19/2017   Elevated sed rate 05/19/2017   Spondylosis without myelopathy or radiculopathy, lumbosacral region 05/19/2017   Pharmacologic therapy 05/06/2017   Disorder of skeletal system 05/06/2017   Problems influencing health status 05/06/2017   Chest pain with moderate risk of acute coronary syndrome 04/21/2017   DOE (dyspnea on exertion) 04/21/2017   Intermittent palpitations 04/21/2017   Vitamin D insufficiency 05/04/2016  Depression 02/04/2016   Fatty liver 02/04/2016   Hypertension associated with diabetes ([place]) 02/04/2016   Chronic pain syndrome 01/15/2016   Acute low back pain 05/28/2015   Lumbar transverse process fracture (HCC) (Left L1) (03/11/15) 05/28/2015   Long term current use of opiate analgesic 05/01/2015   Long term prescription opiate use 05/01/2015   Opiate use (25 MME/Day) 05/01/2015   Encounter for therapeutic drug level monitoring 05/01/2015   Encounter for pain management planning 05/01/2015   Chronic low back pain (Primary  Source of Pain) (Bilateral) (L>R) 05/01/2015   Lumbar facet syndrome (Bilateral) (L>R) 05/01/2015   Chronic knee pain [facility] source of pain) (Bilateral) (L>R) 05/01/2015   Lumbar spondylosis 05/01/2015   Neuropathic pain 05/01/2015   Neurogenic pain 05/01/2015   Myofascial pain 05/01/2015   Essential hypertriglyceridemia 04/26/2014   Type 2 diabetes mellitus with hyperglycemia, with long-term current use of insulin ([place]) 04/26/2014   Anxiety 05/15/2010   Obesity 05/15/2010   Hyperlipidemia 04/03/2010   Fatty liver disease 04/02/2010   Pure hypercholesterolemia 03/19/2010   GERD 04/17/2009   Constipation 04/17/2009   TOBACCO ABUSE 11/06/2008    ONSET DATE: 02/06/2021  REFERRING DIAG: Astrocytoma  THERAPY DIAG:  Muscle weakness (generalized)  Rationale for Evaluation and Treatment Rehabilitation  SUBJECTIVE: Pt. was present with her husband for therapy today.  SUBJECTIVE STATEMENT: Pt. Was present with her husband Pt accompanied by: significant other  PERTINENT HISTORY:  Pt. Is a 51 y.o. 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 PMHx includes: Anorexia, dehydration, Adult Failure to thrive, contracture of muscles multiple sites. Pt./caregiver reports recently being hospitalized with a UTI, and dehydration.   PRECAUTIONS: Fall  WEIGHT BEARING RESTRICTIONS No  PAIN:  Are you having pain?  Pt. Reports 3/10 pain in the left shoulder at rest.  Severe pain, and guarding with attempts for movement  FALLS: Has patient fallen in last 6 months? Yes. Number of falls Multiple Falls  LIVING ENVIRONMENT: Lives with: Family Lives in: House/apartment Stairs: Yes: External: 4 steps; bilateral but cannot reach both Has following equipment at home: Single point cane, Walker - 4 wheeled, Hemi walker, shower chair, bed side commode, and Grab bars Lift chair in the tub, [place],  PLOF:  Independent  PATIENT GOALS: To improve the right UE  OBJECTIVE:   HAND DOMINANCE: Right  ADLs: Transfers/ambulation related to ADLs: Eating: MaxA with cutting food, setting up, Modified I with nondominant Left hand, Silicon Mat  Grooming: Independent  UB Dressing: MaxA  LB Dressing: MaxA Toileting: Uses a BSCommode mInA transfers, MaxA clothing negotiation Bathing: MaxA Tub Shower transfers:  Uses a shower lift chair Equipment:  w/c, BSCommode, cane   IADLs: Shopping: Husband performs Light housekeeping: Max Meal Prep: MaxA Community mobility: Relies on family Medication management:Husband assists Doctor, hospital: Performs together Handwriting: Not legible  MOBILITY STATUS: Hx of falls    ACTIVITY TOLERANCE: Activity tolerance: limited  FUNCTIONAL OUTCOME MEASURES: FOTO: 11.76  UPPER EXTREMITY ROM  : WFL Left AROM/PROM   Passive ROM Right eval Left eval  Shoulder flexion 30   Shoulder abduction 40   Shoulder adduction    Shoulder extension    Shoulder internal rotation    Shoulder external rotation    Elbow flexion 120   Elbow extension -67   Wrist flexion Neutral   Wrist extension 12   Wrist ulnar deviation    Wrist radial deviation    Wrist pronation  Wrist supination    (Blank rows = not tested)   UPPER EXTREMITY MMT:     MMT Right eval Left eval  Shoulder flexion 0/5 4+/5  Shoulder abduction 0/5 4+/5  Shoulder adduction    Shoulder extension    Shoulder internal rotation    Shoulder external rotation    Middle trapezius    Lower trapezius    Elbow flexion 0/5 4+/5  Elbow extension 0/5 4+/5  Wrist flexion    Wrist extension 0/5 4+/5  Wrist ulnar deviation    Wrist radial deviation    Wrist pronation    Wrist supination    (Blank rows = not tested)    SENSATION: Light touch: WFL  EDEMA:   Wrist: 18.5 cm MCP: 21.5 cm    MUSCLE TONE: RUE: Severe  COGNITION: Overall cognitive status:  Impaired  VISION: Subjective report: Wears glasses Baseline vision: Wears glasses all the time Pt. Reports no changes    PRAXIS: Impaired: Motor planning     TODAY'S TREATMENT:   Pt. tolerated retrograde massage to the right hand, and digits secondary to edema. Pt. tolerated soft tissue massage to the muscles in the right scapular region prior to, and in preparation for ROM, and therapeutic Ex. Pt. Tolerated PROM for Right shoulder flexion, abduction, elbow flexion, extension, wrist flexion and extension, digit flexion and extension, and thumb abduction. Pt./caregiver education was provided about edema control techniques, ROM, and positioning.   PATIENT EDUCATION: Education details: Right UE ROM, positioning of the RUE Person educated: Patient and Spouse Education method: Explanation, Demonstration, Tactile cues, and Verbal cues Education comprehension: verbalized understanding, returned demonstration, and needs further education   HOME EXERCISE PROGRAM:   Pt./caregiver education about positioning of the RUE, ROM    GOALS: Goals reviewed with patient? Yes  SHORT TERM GOALS: Target date: 09/15/2021    Pt./caregivers with demonstrate independence with HEPs for UE ROM, and edema control Baseline: No current HEPs Goal status: INITIAL    LONG TERM GOALS: Target date: 10/27/2021    Pt. Will improve right hand edema by 1 cm at the wrist, and MCPs Baseline: Eval: Wrist: 18.5cm, MCPs: 21.5cm Goal status: INITIAL  2.  Pt. Will improve right shoulder PROM by 10 degrees with ADLs Baseline: Eval: PROM shoulder flexion: 30 degrees, Abduction: 40 degrees Goal status: INITIAL  3.  Pt. Will improve right elbow PROM by 10 degrees to prepare for hand to mouth patterns Baseline: Eval:   elbow extension -67, flexion 120 Goal status: INITIAL  4.  Pt. Will improve PROM right wrist flexion, and extension by 10 degrees Baseline: Eval: wrist extension 12 degrees, flexion to  neutral Goal status: INITIAL  5.  Pt. Will tolerate full digit extension in the right hand to promote good skin integrity through the palmar surface of the right hand  Baseline: Pt. Maintains digits in tight flexion Goal status: INITIAL  6.  Pt. Will require minA UE dressing using one armed dressing techniques. Baseline: MaxA Goal status: INITIAL  ASSESSMENT:  CLINICAL IMPRESSION:  Pt.'s right shoulder flexion, and abduction ROM is very limited by pain. Pt. was able to tolerate retrograde massage for edema control, and ROM to the right hand, and digits. Pt. Was able to tolerate increased ROM fo elbow, wrist, and digits today.  PERFORMANCE DEFICITS in functional skills including ADLs, IADLs, coordination, proprioception, sensation, edema, tone, ROM, strength, pain, FMC, GMC, balance, decreased knowledge of use of DME, skin integrity, and UE functional use, cognitive skills including attention, memory, and  safety awareness, and psychosocial skills including coping strategies.   IMPAIRMENTS are limiting patient from ADLs, IADLs, education, work, leisure, and social participation.   COMORBIDITIES may have co-morbidities  that affects occupational performance. Patient will benefit from skilled OT to address above impairments and improve overall function.  MODIFICATION OR ASSISTANCE TO COMPLETE EVALUATION: Maximum modification of tasks or assist with assess necessary to complete an evaluation.  OT OCCUPATIONAL PROFILE AND HISTORY: Detailed assessment: Review of records and additional review of physical, cognitive, psychosocial history related to current functional performance.  CLINICAL DECISION MAKING: High - multiple treatment options, significant modification of task necessary  REHAB POTENTIAL: Good for stated goals  EVALUATION COMPLEXITY: High    PLAN: OT FREQUENCY: 2x/week  OT DURATION: 12 weeks  PLANNED INTERVENTIONS: self care/ADL training, therapeutic exercise, therapeutic  activity, neuromuscular re-education, manual therapy, passive range of motion, paraffin, moist heat, contrast bath, patient/family education, cognitive remediation/compensation, energy conservation, and coping strategies training  RECOMMENDED OTHER SERVICES: PT, and ST  CONSULTED AND AGREED WITH PLAN OF CARE: Patient and family member/caregiver  PLAN FOR NEXT SESSION: Initiate PROM, and RUE Edema control techniques   Harrel Carina, MS, OTR/L  Harrel Carina, OT 08/07/2021, 9:09 AM

## 2021-08-11 ENCOUNTER — Encounter: Payer: Self-pay | Admitting: Occupational Therapy

## 2021-08-11 ENCOUNTER — Ambulatory Visit: Payer: Medicare Other | Admitting: Occupational Therapy

## 2021-08-11 DIAGNOSIS — M6281 Muscle weakness (generalized): Secondary | ICD-10-CM

## 2021-08-11 NOTE — Therapy (Signed)
OUTPATIENT OCCUPATIONAL THERAPY TREATMENT  Patient Name: Kelly Rollins MRN: [id number] DOB:05-04-1970, 51 y.o., female Today's Date: 08/11/2021  PCP: [name], PA-C REFERRING PROVIDER: [name], Corrine Jeani Hawking, PA-C   OT End of Session - 08/11/21 1731     Visit Number 3    Number of Visits 24    Date for OT Re-Evaluation 10/27/21    OT Start Time 1605    OT Stop Time 1645    OT Time Calculation (min) 40 min    Activity Tolerance Patient tolerated treatment well    Behavior During Therapy [facility] for tasks assessed/performed             Past Medical History:  Diagnosis Date   ABDOMINAL PAIN OTHER SPECIFIED SITE 02/21/2009   Qualifier: Diagnosis of  By: Deborra Medina MD, Talia     ABDOMINAL PAIN-LUQ 04/17/2009   Qualifier: Diagnosis of  By: Fuller Plan MD Lamont Snowball T    Anal fissure    Anginal pain ([place])    Asthma    Depression    Diabetes mellitus without complication ([place])    Dyspnea    Dysrhythmia    Headache(784.0)    HEMATOCHEZIA 04/17/2009   Qualifier: Diagnosis of  By: Fuller Plan MD Lamont Snowball T    Hyperlipidemia    Hypertension    IBS (irritable bowel syndrome)    Migraine headache 11/06/2008   Qualifier: Diagnosis of  By: Deborra Medina MD, Talia     OVARIAN CYST, RIGHT 02/21/2009   Qualifier: Diagnosis of  By: Deborra Medina MD, Talia     PALPITATIONS, OCCASIONAL 04/17/2010   Qualifier: Diagnosis of  By: Deborra Medina MD, Talia     Pancreatitis 10/14/2017   Past Surgical History:  Procedure Laterality Date   BREAST EXCISIONAL BIOPSY Left 1997   ? left side, done at time of reduction , benign   BREAST REDUCTION SURGERY     bilateral   CESAREAN SECTION     CHOLECYSTECTOMY     COLONOSCOPY     ESOPHAGOGASTRODUODENOSCOPY     ESOPHAGOGASTRODUODENOSCOPY (EGD) WITH PROPOFOL N/A 10/21/2020   Procedure: ESOPHAGOGASTRODUODENOSCOPY (EGD) WITH PROPOFOL;  Surgeon: Lesly Rubenstein, MD;  Location: [facility] ENDOSCOPY;  Service: Endoscopy;  Laterality: N/A;   REDUCTION MAMMAPLASTY Bilateral  1997   VAGINAL HYSTERECTOMY     Patient Active Problem List   Diagnosis Date Noted   Abnormal MRI, lumbar spine (01/29/2020) 02/19/2020   Anxiety due to MRI 01/25/2020   Claustrophobia associated to MRI 01/25/2020   Enthesopathy of knee region (Right) 01/25/2020   Enthesopathy of knee region (Left) 01/25/2020   Lumbar facet hypertrophy 01/25/2020   Lumbosacral radiculopathy at S1 (Left) 01/17/2020   Lumbosacral radiculopathy (S1) (Left) 01/17/2020   Chronic lower extremity pain (Bilateral) (L>R) 01/17/2020   History of pancreatitis 07/18/2019   Obstructive sleep apnea syndrome 01/02/2019   Lumbar facet arthropathy (Multilevel) (Bilateral) 12/14/2018   Osteoarthritis involving multiple joints 11/28/2018   Chronic hip pain (Left) 11/16/2018   Acute hip pain (Left) 11/16/2018   DDD (degenerative disc disease), lumbosacral 11/16/2018   Chronic hip pain (Secondary source of pain) (Bilateral) (L>R) 05/19/2017   Elevated C-reactive protein (CRP) 05/19/2017   Elevated sed rate 05/19/2017   Spondylosis without myelopathy or radiculopathy, lumbosacral region 05/19/2017   Pharmacologic therapy 05/06/2017   Disorder of skeletal system 05/06/2017   Problems influencing health status 05/06/2017   Chest pain with moderate risk of acute coronary syndrome 04/21/2017   DOE (dyspnea on exertion) 04/21/2017   Intermittent palpitations 04/21/2017  Vitamin D insufficiency 05/04/2016   Depression 02/04/2016   Fatty liver 02/04/2016   Hypertension associated with diabetes ([place]) 02/04/2016   Chronic pain syndrome 01/15/2016   Acute low back pain 05/28/2015   Lumbar transverse process fracture (HCC) (Left L1) (03/11/15) 05/28/2015   Long term current use of opiate analgesic 05/01/2015   Long term prescription opiate use 05/01/2015   Opiate use (25 MME/Day) 05/01/2015   Encounter for therapeutic drug level monitoring 05/01/2015   Encounter for pain management planning 05/01/2015   Chronic low back  pain (Primary Source of Pain) (Bilateral) (L>R) 05/01/2015   Lumbar facet syndrome (Bilateral) (L>R) 05/01/2015   Chronic knee pain [facility] source of pain) (Bilateral) (L>R) 05/01/2015   Lumbar spondylosis 05/01/2015   Neuropathic pain 05/01/2015   Neurogenic pain 05/01/2015   Myofascial pain 05/01/2015   Essential hypertriglyceridemia 04/26/2014   Type 2 diabetes mellitus with hyperglycemia, with long-term current use of insulin ([place]) 04/26/2014   Anxiety 05/15/2010   Obesity 05/15/2010   Hyperlipidemia 04/03/2010   Fatty liver disease 04/02/2010   Pure hypercholesterolemia 03/19/2010   GERD 04/17/2009   Constipation 04/17/2009   TOBACCO ABUSE 11/06/2008    ONSET DATE: 02/06/2021  REFERRING DIAG: Astrocytoma  THERAPY DIAG:  Muscle weakness (generalized)  Rationale for Evaluation and Treatment Rehabilitation  SUBJECTIVE: Pt. was present with her husband for therapy today.  SUBJECTIVE STATEMENT: Pt. Reports having had intermittent episodes of cramping in the RUE over the weekend. Pt accompanied by: significant other  PERTINENT HISTORY:  Pt. Is a 51 y.o. 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 PMHx includes: Anorexia, dehydration, Adult Failure to thrive, contracture of muscles multiple sites. Pt./caregiver reports recently being hospitalized with a UTI, and dehydration.   PRECAUTIONS: Fall  WEIGHT BEARING RESTRICTIONS No  PAIN:  Are you having pain?  Pt. Reports 6/10 pain in the left shoulder at rest. Pt.'s RUE is hypersensitive with Intermittent severe pain, and guarding with attempts for movement  FALLS: Has patient fallen in last 6 months? Yes. Number of falls Multiple Falls  LIVING ENVIRONMENT: Lives with: Family Lives in: House/apartment Stairs: Yes: External: 4 steps; bilateral but cannot reach both Has following equipment at home: Single point cane, Walker - 4 wheeled,  Hemi walker, shower chair, bed side commode, and Grab bars Lift chair in the tub, [place],  PLOF: Independent  PATIENT GOALS: To improve the right UE  OBJECTIVE:   HAND DOMINANCE: Right  ADLs: Transfers/ambulation related to ADLs: Eating: MaxA with cutting food, setting up, Modified I with nondominant Left hand, [place]  Grooming: Independent  UB Dressing: MaxA  LB Dressing: MaxA Toileting: Uses a BSCommode mInA transfers, MaxA clothing negotiation Bathing: MaxA Tub Shower transfers:  Uses a shower lift chair Equipment:  w/c, BSCommode, cane   IADLs: Shopping: Husband performs Light housekeeping: Max Meal Prep: MaxA Community mobility: Relies on family Medication management:Husband assists Doctor, hospital: Performs together Handwriting: Not legible  MOBILITY STATUS: Hx of falls    ACTIVITY TOLERANCE: Activity tolerance: limited  FUNCTIONAL OUTCOME MEASURES: FOTO: 11.76  UPPER EXTREMITY ROM  : WFL Left AROM/PROM   Passive ROM Right eval Left eval  Shoulder flexion 30   Shoulder abduction 40   Shoulder adduction    Shoulder extension    Shoulder internal rotation    Shoulder external rotation    Elbow flexion 120   Elbow extension -67   Wrist flexion Neutral   Wrist  extension 12   Wrist ulnar deviation    Wrist radial deviation    Wrist pronation    Wrist supination    (Blank rows = not tested)   UPPER EXTREMITY MMT:     MMT Right eval Left eval  Shoulder flexion 0/5 4+/5  Shoulder abduction 0/5 4+/5  Shoulder adduction    Shoulder extension    Shoulder internal rotation    Shoulder external rotation    Middle trapezius    Lower trapezius    Elbow flexion 0/5 4+/5  Elbow extension 0/5 4+/5  Wrist flexion    Wrist extension 0/5 4+/5  Wrist ulnar deviation    Wrist radial deviation    Wrist pronation    Wrist supination    (Blank rows = not tested)    SENSATION: Light touch: WFL  EDEMA:   Wrist: 18.5 cm MCP: 21.5  cm    MUSCLE TONE: RUE: Severe  COGNITION: Overall cognitive status: Impaired  VISION: Subjective report: Wears glasses Baseline vision: Wears glasses all the time Pt. Reports no changes    PRAXIS: Impaired: Motor planning     TODAY'S TREATMENT:   Pt. tolerated retrograde massage to the right hand, and digits secondary to edema. Pt. tolerated soft tissue massage to the muscles in the right scapular region prior to, and in preparation for ROM, and therapeutic Ex. Pt. tolerated PROM for Right shoulder flexion, abduction, elbow flexion, extension, wrist flexion and extension, digit flexion and extension, and thumb abduction. Pt./caregiver education was provided about edema control techniques, ROM, positioning in right shoulder abduction with a pillow when in sitting. Pt. was provided with a palm protector for the right hand. Pt. tolerated it well without skin irritation, or breakdown in the right palm. Pt./caregiver were provided with education about the palm protector, and recommended wearing schedule.    PATIENT EDUCATION: Education details: Right UE ROM, positioning of the RUE Person educated: Patient and Spouse Education method: Explanation, Demonstration, Tactile cues, and Verbal cues Education comprehension: verbalized understanding, returned demonstration, and needs further education   HOME EXERCISE PROGRAM:   Pt./caregiver education about positioning of the RUE, ROM    GOALS: Goals reviewed with patient? Yes  SHORT TERM GOALS: Target date: 09/15/2021    Pt./caregivers with demonstrate independence with HEPs for UE ROM, and edema control Baseline: No current HEPs Goal status: INITIAL    LONG TERM GOALS: Target date: 10/27/2021    Pt. Will improve right hand edema by 1 cm at the wrist, and MCPs Baseline: Eval: Wrist: 18.5cm, MCPs: 21.5cm Goal status: INITIAL  2.  Pt. Will improve right shoulder PROM by 10 degrees with ADLs Baseline: Eval: PROM shoulder  flexion: 30 degrees, Abduction: 40 degrees Goal status: INITIAL  3.  Pt. Will improve right elbow PROM by 10 degrees to prepare for hand to mouth patterns Baseline: Eval:   elbow extension -67, flexion 120 Goal status: INITIAL  4.  Pt. Will improve PROM right wrist flexion, and extension by 10 degrees Baseline: Eval: wrist extension 12 degrees, flexion to neutral Goal status: INITIAL  5.  Pt. Will tolerate full digit extension in the right hand to promote good skin integrity through the palmar surface of the right hand  Baseline: Pt. Maintains digits in tight flexion Goal status: INITIAL  6.  Pt. Will require minA UE dressing using one armed dressing techniques. Baseline: MaxA Goal status: INITIAL  ASSESSMENT:  CLINICAL IMPRESSION:  Pt.'s right shoulder flexion, and abduction, and digit extension ROM is very limited  by pain today. Pt. was able to tolerate slightly more abduction today following moist heat pack modality to the  right shoulder.  Pt. maintains the digits on her right hand in tight flexion.  Pt. was able to tolerate a palm protector in the right handout skin irritation, or changes to the palmar skin integrity. Pt. was able to tolerate retrograde massage for edema control, and ROM to the right hand, and digits. Pt. Continues to work on improving pain, and improving RUE ROM in order to improve engagement with ADLs/IADLs, and promote good skin integrity through the palmar surface of the hand.  PERFORMANCE DEFICITS in functional skills including ADLs, IADLs, coordination, proprioception, sensation, edema, tone, ROM, strength, pain, FMC, GMC, balance, decreased knowledge of use of DME, skin integrity, and UE functional use, cognitive skills including attention, memory, and safety awareness, and psychosocial skills including coping strategies.   IMPAIRMENTS are limiting patient from ADLs, IADLs, education, work, leisure, and social participation.   COMORBIDITIES may have  co-morbidities  that affects occupational performance. Patient will benefit from skilled OT to address above impairments and improve overall function.  MODIFICATION OR ASSISTANCE TO COMPLETE EVALUATION: Maximum modification of tasks or assist with assess necessary to complete an evaluation.  OT OCCUPATIONAL PROFILE AND HISTORY: Detailed assessment: Review of records and additional review of physical, cognitive, psychosocial history related to current functional performance.  CLINICAL DECISION MAKING: High - multiple treatment options, significant modification of task necessary  REHAB POTENTIAL: Good for stated goals  EVALUATION COMPLEXITY: High    PLAN: OT FREQUENCY: 2x/week  OT DURATION: 12 weeks  PLANNED INTERVENTIONS: self care/ADL training, therapeutic exercise, therapeutic activity, neuromuscular re-education, manual therapy, passive range of motion, paraffin, moist heat, contrast bath, patient/family education, cognitive remediation/compensation, energy conservation, and coping strategies training  RECOMMENDED OTHER SERVICES: PT, and ST  CONSULTED AND AGREED WITH PLAN OF CARE: Patient and family member/caregiver  PLAN FOR NEXT SESSION: Initiate PROM, and RUE Edema control techniques   Harrel Carina, MS, OTR/L  Harrel Carina, OT 08/11/2021, 5:36 PM

## 2021-08-13 ENCOUNTER — Ambulatory Visit: Payer: Medicare Other | Admitting: Occupational Therapy

## 2021-08-13 DIAGNOSIS — M6281 Muscle weakness (generalized): Secondary | ICD-10-CM | POA: Diagnosis not present

## 2021-08-14 NOTE — Therapy (Signed)
OUTPATIENT OCCUPATIONAL THERAPY TREATMENT  Patient Name: Kelly Rollins MRN: [id number] DOB:1971/01/09, 51 y.o., female Today's Date: 08/14/2021  PCP: [name], PA-C REFERRING PROVIDER: [name], Corrine Jeani Hawking, PA-C   OT End of Session - 08/14/21 0847     Visit Number 4    Number of Visits 24    Date for OT Re-Evaluation 10/27/21    OT Start Time 1425    OT Stop Time 1515    OT Time Calculation (min) 50 min    Activity Tolerance Patient tolerated treatment well    Behavior During Therapy [facility] for tasks assessed/performed             Past Medical History:  Diagnosis Date   ABDOMINAL PAIN OTHER SPECIFIED SITE 02/21/2009   Qualifier: Diagnosis of  By: Deborra Medina MD, Talia     ABDOMINAL PAIN-LUQ 04/17/2009   Qualifier: Diagnosis of  By: Fuller Plan MD Lamont Snowball T    Anal fissure    Anginal pain ([place])    Asthma    Depression    Diabetes mellitus without complication ([place])    Dyspnea    Dysrhythmia    Headache(784.0)    HEMATOCHEZIA 04/17/2009   Qualifier: Diagnosis of  By: Fuller Plan MD Lamont Snowball T    Hyperlipidemia    Hypertension    IBS (irritable bowel syndrome)    Migraine headache 11/06/2008   Qualifier: Diagnosis of  By: Deborra Medina MD, Talia     OVARIAN CYST, RIGHT 02/21/2009   Qualifier: Diagnosis of  By: Deborra Medina MD, Talia     PALPITATIONS, OCCASIONAL 04/17/2010   Qualifier: Diagnosis of  By: Deborra Medina MD, Talia     Pancreatitis 10/14/2017   Past Surgical History:  Procedure Laterality Date   BREAST EXCISIONAL BIOPSY Left 1997   ? left side, done at time of reduction , benign   BREAST REDUCTION SURGERY     bilateral   CESAREAN SECTION     CHOLECYSTECTOMY     COLONOSCOPY     ESOPHAGOGASTRODUODENOSCOPY     ESOPHAGOGASTRODUODENOSCOPY (EGD) WITH PROPOFOL N/A 10/21/2020   Procedure: ESOPHAGOGASTRODUODENOSCOPY (EGD) WITH PROPOFOL;  Surgeon: Lesly Rubenstein, MD;  Location: [facility] ENDOSCOPY;  Service: Endoscopy;  Laterality: N/A;   REDUCTION MAMMAPLASTY Bilateral  1997   VAGINAL HYSTERECTOMY     Patient Active Problem List   Diagnosis Date Noted   Abnormal MRI, lumbar spine (01/29/2020) 02/19/2020   Anxiety due to MRI 01/25/2020   Claustrophobia associated to MRI 01/25/2020   Enthesopathy of knee region (Right) 01/25/2020   Enthesopathy of knee region (Left) 01/25/2020   Lumbar facet hypertrophy 01/25/2020   Lumbosacral radiculopathy at S1 (Left) 01/17/2020   Lumbosacral radiculopathy (S1) (Left) 01/17/2020   Chronic lower extremity pain (Bilateral) (L>R) 01/17/2020   History of pancreatitis 07/18/2019   Obstructive sleep apnea syndrome 01/02/2019   Lumbar facet arthropathy (Multilevel) (Bilateral) 12/14/2018   Osteoarthritis involving multiple joints 11/28/2018   Chronic hip pain (Left) 11/16/2018   Acute hip pain (Left) 11/16/2018   DDD (degenerative disc disease), lumbosacral 11/16/2018   Chronic hip pain (Secondary source of pain) (Bilateral) (L>R) 05/19/2017   Elevated C-reactive protein (CRP) 05/19/2017   Elevated sed rate 05/19/2017   Spondylosis without myelopathy or radiculopathy, lumbosacral region 05/19/2017   Pharmacologic therapy 05/06/2017   Disorder of skeletal system 05/06/2017   Problems influencing health status 05/06/2017   Chest pain with moderate risk of acute coronary syndrome 04/21/2017   DOE (dyspnea on exertion) 04/21/2017   Intermittent palpitations 04/21/2017  Vitamin D insufficiency 05/04/2016   Depression 02/04/2016   Fatty liver 02/04/2016   Hypertension associated with diabetes ([place]) 02/04/2016   Chronic pain syndrome 01/15/2016   Acute low back pain 05/28/2015   Lumbar transverse process fracture (HCC) (Left L1) (03/11/15) 05/28/2015   Long term current use of opiate analgesic 05/01/2015   Long term prescription opiate use 05/01/2015   Opiate use (25 MME/Day) 05/01/2015   Encounter for therapeutic drug level monitoring 05/01/2015   Encounter for pain management planning 05/01/2015   Chronic low back  pain (Primary Source of Pain) (Bilateral) (L>R) 05/01/2015   Lumbar facet syndrome (Bilateral) (L>R) 05/01/2015   Chronic knee pain [facility] source of pain) (Bilateral) (L>R) 05/01/2015   Lumbar spondylosis 05/01/2015   Neuropathic pain 05/01/2015   Neurogenic pain 05/01/2015   Myofascial pain 05/01/2015   Essential hypertriglyceridemia 04/26/2014   Type 2 diabetes mellitus with hyperglycemia, with long-term current use of insulin ([place]) 04/26/2014   Anxiety 05/15/2010   Obesity 05/15/2010   Hyperlipidemia 04/03/2010   Fatty liver disease 04/02/2010   Pure hypercholesterolemia 03/19/2010   GERD 04/17/2009   Constipation 04/17/2009   TOBACCO ABUSE 11/06/2008    ONSET DATE: 02/06/2021  REFERRING DIAG: Astrocytoma  THERAPY DIAG:  Muscle weakness (generalized)  Rationale for Evaluation and Treatment Rehabilitation  SUBJECTIVE: Pt. was present with her husband for therapy today.  SUBJECTIVE STATEMENT: Pt. Reports having had intermittent episodes of cramping in the RUE over the weekend. Pt accompanied by: significant other  PERTINENT HISTORY:  Pt. Is a 51 y.o. 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 PMHx includes: Anorexia, dehydration, Adult Failure to thrive, contracture of muscles multiple sites. Pt./caregiver reports recently being hospitalized with a UTI, and dehydration.   PRECAUTIONS: Fall  WEIGHT BEARING RESTRICTIONS No  PAIN:  Are you having pain?  Pt. Reports 4/10 pain in the right elbow.   FALLS: Has patient fallen in last 6 months? Yes. Number of falls Multiple Falls  LIVING ENVIRONMENT: Lives with: Family Lives in: House/apartment Stairs: Yes: External: 4 steps; bilateral but cannot reach both Has following equipment at home: Single point cane, Walker - 4 wheeled, Hemi walker, shower chair, bed side commode, and Grab bars Lift chair in the tub, [place],  PLOF:  Independent  PATIENT GOALS: To improve the right UE  OBJECTIVE:   HAND DOMINANCE: Right  ADLs: Transfers/ambulation related to ADLs: Eating: MaxA with cutting food, setting up, Modified I with nondominant Left hand, [place]  Grooming: Independent  UB Dressing: MaxA  LB Dressing: MaxA Toileting: Uses a BSCommode mInA transfers, MaxA clothing negotiation Bathing: MaxA Tub Shower transfers:  Uses a shower lift chair Equipment:  w/c, BSCommode, cane   IADLs: Shopping: Husband performs Light housekeeping: Max Meal Prep: MaxA Community mobility: Relies on family Medication management:Husband assists Doctor, hospital: Performs together Handwriting: Not legible  MOBILITY STATUS: Hx of falls    ACTIVITY TOLERANCE: Activity tolerance: limited  FUNCTIONAL OUTCOME MEASURES: FOTO: 11.76  UPPER EXTREMITY ROM  : WFL Left AROM/PROM   Passive ROM Right eval Left eval  Shoulder flexion 30   Shoulder abduction 40   Shoulder adduction    Shoulder extension    Shoulder internal rotation    Shoulder external rotation    Elbow flexion 120   Elbow extension -67   Wrist flexion Neutral   Wrist extension 12   Wrist ulnar deviation    Wrist radial deviation  Wrist pronation    Wrist supination    (Blank rows = not tested)   UPPER EXTREMITY MMT:     MMT Right eval Left eval  Shoulder flexion 0/5 4+/5  Shoulder abduction 0/5 4+/5  Shoulder adduction    Shoulder extension    Shoulder internal rotation    Shoulder external rotation    Middle trapezius    Lower trapezius    Elbow flexion 0/5 4+/5  Elbow extension 0/5 4+/5  Wrist flexion    Wrist extension 0/5 4+/5  Wrist ulnar deviation    Wrist radial deviation    Wrist pronation    Wrist supination    (Blank rows = not tested)    SENSATION: Light touch: WFL  EDEMA:   Wrist: 18.5 cm MCP: 21.5 cm    MUSCLE TONE: RUE: Severe  COGNITION: Overall cognitive status:  Impaired  VISION: Subjective report: Wears glasses Baseline vision: Wears glasses all the time Pt. Reports no changes    PRAXIS: Impaired: Motor planning     TODAY'S TREATMENT:   Pt. tolerated retrograde massage to the right hand, and digits secondary to edema. Pt. tolerated soft tissue massage to the muscles in the right scapular region prior to, and in preparation for ROM, and therapeutic Ex. Pt. tolerated PROM for Right shoulder flexion, abduction, elbow flexion, extension, wrist flexion and extension, digit flexion and extension, and thumb abduction. Pt./caregiver education was provided about edema control techniques, ROM, positioning in right shoulder abduction with a pillow when in sitting. After ROM, Pt. was provided with a temporary 1" foam roll attachment to the palm protector to increase resting ROM. Pt. tolerated it well without skin irritation, or breakdown in the right palm. Pt./caregiver were provided with education about the goal of the temporary 1" foam roll with the palm protector, and recommended wearing time.    PATIENT EDUCATION: Education details: Right UE ROM, positioning of the RUE Person educated: Patient and Spouse Education method: Explanation, Demonstration, Tactile cues, and Verbal cues Education comprehension: verbalized understanding, returned demonstration, and needs further education   HOME EXERCISE PROGRAM:   Pt./caregiver education about positioning of the RUE, ROM    GOALS: Goals reviewed with patient? Yes  SHORT TERM GOALS: Target date: 09/15/2021    Pt./caregivers with demonstrate independence with HEPs for UE ROM, and edema control Baseline: No current HEPs Goal status: INITIAL    LONG TERM GOALS: Target date: 10/27/2021    Pt. Will improve right hand edema by 1 cm at the wrist, and MCPs Baseline: Eval: Wrist: 18.5cm, MCPs: 21.5cm Goal status: INITIAL  2.  Pt. Will improve right shoulder PROM by 10 degrees with ADLs Baseline:  Eval: PROM shoulder flexion: 30 degrees, Abduction: 40 degrees Goal status: INITIAL  3.  Pt. Will improve right elbow PROM by 10 degrees to prepare for hand to mouth patterns Baseline: Eval:   elbow extension -67, flexion 120 Goal status: INITIAL  4.  Pt. Will improve PROM right wrist flexion, and extension by 10 degrees Baseline: Eval: wrist extension 12 degrees, flexion to neutral Goal status: INITIAL  5.  Pt. Will tolerate full digit extension in the right hand to promote good skin integrity through the palmar surface of the right hand  Baseline: Pt. Maintains digits in tight flexion Goal status: INITIAL  6.  Pt. Will require minA UE dressing using one armed dressing techniques. Baseline: MaxA Goal status: INITIAL  ASSESSMENT:  CLINICAL IMPRESSION:  Pt. reports that the palm protector has been going very well.  Pt.'s right shoulder flexion, and abduction, and digit extension ROM is very limited by pain today. Pt. was able to tolerate slightly more abduction today following moist heat pack modality to the  right shoulder.  Pt.initially maintains the digits on her right hand in tight flexion. Pt. was able to tolerate retrograde massage for edema control, and ROM to the right hand, and digits. Pt. continues to work on improving pain, and improving RUE ROM in order to improve engagement with ADLs/IADLs, and promote good skin integrity through the palmar surface of the hand.  PERFORMANCE DEFICITS in functional skills including ADLs, IADLs, coordination, proprioception, sensation, edema, tone, ROM, strength, pain, FMC, GMC, balance, decreased knowledge of use of DME, skin integrity, and UE functional use, cognitive skills including attention, memory, and safety awareness, and psychosocial skills including coping strategies.   IMPAIRMENTS are limiting patient from ADLs, IADLs, education, work, leisure, and social participation.   COMORBIDITIES may have co-morbidities  that affects  occupational performance. Patient will benefit from skilled OT to address above impairments and improve overall function.  MODIFICATION OR ASSISTANCE TO COMPLETE EVALUATION: Maximum modification of tasks or assist with assess necessary to complete an evaluation.  OT OCCUPATIONAL PROFILE AND HISTORY: Detailed assessment: Review of records and additional review of physical, cognitive, psychosocial history related to current functional performance.  CLINICAL DECISION MAKING: High - multiple treatment options, significant modification of task necessary  REHAB POTENTIAL: Good for stated goals  EVALUATION COMPLEXITY: High    PLAN: OT FREQUENCY: 2x/week  OT DURATION: 12 weeks  PLANNED INTERVENTIONS: self care/ADL training, therapeutic exercise, therapeutic activity, neuromuscular re-education, manual therapy, passive range of motion, paraffin, moist heat, contrast bath, patient/family education, cognitive remediation/compensation, energy conservation, and coping strategies training  RECOMMENDED OTHER SERVICES: PT, and ST  CONSULTED AND AGREED WITH PLAN OF CARE: Patient and family member/caregiver  PLAN FOR NEXT SESSION: Initiate PROM, and RUE Edema control techniques   Harrel Carina, MS, OTR/L  Harrel Carina, OT 08/14/2021, 8:54 AM

## 2021-08-18 ENCOUNTER — Ambulatory Visit: Payer: Medicare Other | Admitting: Occupational Therapy

## 2021-08-18 ENCOUNTER — Encounter: Payer: Self-pay | Admitting: Occupational Therapy

## 2021-08-18 DIAGNOSIS — M6281 Muscle weakness (generalized): Secondary | ICD-10-CM

## 2021-08-18 DIAGNOSIS — R278 Other lack of coordination: Secondary | ICD-10-CM

## 2021-08-18 NOTE — Therapy (Signed)
OUTPATIENT OCCUPATIONAL THERAPY TREATMENT  Patient Name: Kelly Rollins MRN: [id number] DOB:Nov 27, 1970, 51 y.o., female Today's Date: 08/18/2021  PCP: [name], PA-C REFERRING PROVIDER: [name], Corrine Jeani Hawking, PA-C   OT End of Session - 08/18/21 1651     Visit Number 5    Number of Visits 24    Date for OT Re-Evaluation 10/27/21    OT Start Time 1300    OT Stop Time 1345    OT Time Calculation (min) 45 min    Activity Tolerance Patient tolerated treatment well    Behavior During Therapy [facility] for tasks assessed/performed             Past Medical History:  Diagnosis Date   ABDOMINAL PAIN OTHER SPECIFIED SITE 02/21/2009   Qualifier: Diagnosis of  By: Deborra Medina MD, Talia     ABDOMINAL PAIN-LUQ 04/17/2009   Qualifier: Diagnosis of  By: Fuller Plan MD Lamont Snowball T    Anal fissure    Anginal pain ([place])    Asthma    Depression    Diabetes mellitus without complication ([place])    Dyspnea    Dysrhythmia    Headache(784.0)    HEMATOCHEZIA 04/17/2009   Qualifier: Diagnosis of  By: Fuller Plan MD Lamont Snowball T    Hyperlipidemia    Hypertension    IBS (irritable bowel syndrome)    Migraine headache 11/06/2008   Qualifier: Diagnosis of  By: Deborra Medina MD, Talia     OVARIAN CYST, RIGHT 02/21/2009   Qualifier: Diagnosis of  By: Deborra Medina MD, Talia     PALPITATIONS, OCCASIONAL 04/17/2010   Qualifier: Diagnosis of  By: Deborra Medina MD, Talia     Pancreatitis 10/14/2017   Past Surgical History:  Procedure Laterality Date   BREAST EXCISIONAL BIOPSY Left 1997   ? left side, done at time of reduction , benign   BREAST REDUCTION SURGERY     bilateral   CESAREAN SECTION     CHOLECYSTECTOMY     COLONOSCOPY     ESOPHAGOGASTRODUODENOSCOPY     ESOPHAGOGASTRODUODENOSCOPY (EGD) WITH PROPOFOL N/A 10/21/2020   Procedure: ESOPHAGOGASTRODUODENOSCOPY (EGD) WITH PROPOFOL;  Surgeon: Lesly Rubenstein, MD;  Location: [facility] ENDOSCOPY;  Service: Endoscopy;  Laterality: N/A;   REDUCTION MAMMAPLASTY Bilateral  1997   VAGINAL HYSTERECTOMY     Patient Active Problem List   Diagnosis Date Noted   Abnormal MRI, lumbar spine (01/29/2020) 02/19/2020   Anxiety due to MRI 01/25/2020   Claustrophobia associated to MRI 01/25/2020   Enthesopathy of knee region (Right) 01/25/2020   Enthesopathy of knee region (Left) 01/25/2020   Lumbar facet hypertrophy 01/25/2020   Lumbosacral radiculopathy at S1 (Left) 01/17/2020   Lumbosacral radiculopathy (S1) (Left) 01/17/2020   Chronic lower extremity pain (Bilateral) (L>R) 01/17/2020   History of pancreatitis 07/18/2019   Obstructive sleep apnea syndrome 01/02/2019   Lumbar facet arthropathy (Multilevel) (Bilateral) 12/14/2018   Osteoarthritis involving multiple joints 11/28/2018   Chronic hip pain (Left) 11/16/2018   Acute hip pain (Left) 11/16/2018   DDD (degenerative disc disease), lumbosacral 11/16/2018   Chronic hip pain (Secondary source of pain) (Bilateral) (L>R) 05/19/2017   Elevated C-reactive protein (CRP) 05/19/2017   Elevated sed rate 05/19/2017   Spondylosis without myelopathy or radiculopathy, lumbosacral region 05/19/2017   Pharmacologic therapy 05/06/2017   Disorder of skeletal system 05/06/2017   Problems influencing health status 05/06/2017   Chest pain with moderate risk of acute coronary syndrome 04/21/2017   DOE (dyspnea on exertion) 04/21/2017   Intermittent palpitations 04/21/2017  Vitamin D insufficiency 05/04/2016   Depression 02/04/2016   Fatty liver 02/04/2016   Hypertension associated with diabetes ([place]) 02/04/2016   Chronic pain syndrome 01/15/2016   Acute low back pain 05/28/2015   Lumbar transverse process fracture (HCC) (Left L1) (03/11/15) 05/28/2015   Long term current use of opiate analgesic 05/01/2015   Long term prescription opiate use 05/01/2015   Opiate use (25 MME/Day) 05/01/2015   Encounter for therapeutic drug level monitoring 05/01/2015   Encounter for pain management planning 05/01/2015   Chronic low back  pain (Primary Source of Pain) (Bilateral) (L>R) 05/01/2015   Lumbar facet syndrome (Bilateral) (L>R) 05/01/2015   Chronic knee pain [facility] source of pain) (Bilateral) (L>R) 05/01/2015   Lumbar spondylosis 05/01/2015   Neuropathic pain 05/01/2015   Neurogenic pain 05/01/2015   Myofascial pain 05/01/2015   Essential hypertriglyceridemia 04/26/2014   Type 2 diabetes mellitus with hyperglycemia, with long-term current use of insulin ([place]) 04/26/2014   Anxiety 05/15/2010   Obesity 05/15/2010   Hyperlipidemia 04/03/2010   Fatty liver disease 04/02/2010   Pure hypercholesterolemia 03/19/2010   GERD 04/17/2009   Constipation 04/17/2009   TOBACCO ABUSE 11/06/2008    ONSET DATE: 02/06/2021  REFERRING DIAG: Astrocytoma  THERAPY DIAG:  Muscle weakness (generalized)  Other lack of coordination  Rationale for Evaluation and Treatment Rehabilitation  SUBJECTIVE: Pt. was present with her husband for therapy today.  SUBJECTIVE STATEMENT: Pt. Reports having had a busy weekend, however reports that it was very fatiguing. Pt accompanied by: significant other  PERTINENT HISTORY:  Pt. Is a 51 y.o. 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 PMHx includes: Anorexia, dehydration, Adult Failure to thrive, contracture of muscles multiple sites. Pt./caregiver reports recently being hospitalized with a UTI, and dehydration.   PRECAUTIONS: Fall  WEIGHT BEARING RESTRICTIONS No  PAIN:  Are you having pain?  Pt. Reports 4/10 pain in the right elbow.   FALLS: Has patient fallen in last 6 months? Yes. Number of falls Multiple Falls  LIVING ENVIRONMENT: Lives with: Family Lives in: House/apartment Stairs: Yes: External: 4 steps; bilateral but cannot reach both Has following equipment at home: Single point cane, Walker - 4 wheeled, Hemi walker, shower chair, bed side commode, and Grab bars Lift chair in the  tub, [place],  PLOF: Independent  PATIENT GOALS: To improve the right UE  OBJECTIVE:   HAND DOMINANCE: Right  ADLs: Transfers/ambulation related to ADLs: Eating: MaxA with cutting food, setting up, Modified I with nondominant Left hand, [place]  Grooming: Independent  UB Dressing: MaxA  LB Dressing: MaxA Toileting: Uses a BSCommode mInA transfers, MaxA clothing negotiation Bathing: MaxA Tub Shower transfers:  Uses a shower lift chair Equipment:  w/c, BSCommode, cane   IADLs: Shopping: Husband performs Light housekeeping: Max Meal Prep: MaxA Community mobility: Relies on family Medication management:Husband assists Doctor, hospital: Performs together Handwriting: Not legible  MOBILITY STATUS: Hx of falls    ACTIVITY TOLERANCE: Activity tolerance: limited  FUNCTIONAL OUTCOME MEASURES: FOTO: 11.76  UPPER EXTREMITY ROM  : WFL Left AROM/PROM   Passive ROM Right eval Right  08/18/21  Shoulder flexion 30   Shoulder abduction 40 62  Shoulder adduction    Shoulder extension    Shoulder internal rotation    Shoulder external rotation    Elbow flexion 120   Elbow extension -67   Wrist flexion Neutral   Wrist extension 12   Wrist ulnar deviation  Wrist radial deviation    Wrist pronation    Wrist supination    (Blank rows = not tested)   UPPER EXTREMITY MMT:     MMT Right eval Left eval  Shoulder flexion 0/5 4+/5  Shoulder abduction 0/5 4+/5  Shoulder adduction    Shoulder extension    Shoulder internal rotation    Shoulder external rotation    Middle trapezius    Lower trapezius    Elbow flexion 0/5 4+/5  Elbow extension 0/5 4+/5  Wrist flexion    Wrist extension 0/5 4+/5  Wrist ulnar deviation    Wrist radial deviation    Wrist pronation    Wrist supination    (Blank rows = not tested)    SENSATION: Light touch: WFL  EDEMA:   Wrist: 18.5 cm MCP: 21.5 cm    MUSCLE TONE: RUE: Severe  COGNITION: Overall cognitive status:  Impaired  VISION: Subjective report: Wears glasses Baseline vision: Wears glasses all the time Pt. Reports no changes    PRAXIS: Impaired: Motor planning     TODAY'S TREATMENT:   Pt. tolerated retrograde massage to the right hand, and digits secondary to edema. Pt. tolerated soft tissue massage to the muscles in the right scapular region prior to, and in preparation for ROM, and therapeutic Ex. Pt. tolerated PROM for Right shoulder flexion, abduction, elbow flexion, extension, wrist flexion and extension, digit flexion and extension, and thumb abduction. Pt./caregiver education was provided about edema control techniques, ROM, positioning in right shoulder abduction with a pillow when in sitting. After ROM, Pt. was provided with a temporary 1" foam roll attachment to the palm protector to increase resting ROM. Pt. Continues to tolerate the palm protector with  1" roll without skin irritation, or breakdown in the right palm.     PATIENT EDUCATION: Education details: Right UE ROM, positioning of the RUE Person educated: Patient and Spouse Education method: Explanation, Demonstration, Tactile cues, and Verbal cues Education comprehension: verbalized understanding, returned demonstration, and needs further education   HOME EXERCISE PROGRAM:   Pt./caregiver education about positioning of the RUE, ROM    GOALS: Goals reviewed with patient? Yes  SHORT TERM GOALS: Target date: 09/15/2021    Pt./caregivers with demonstrate independence with HEPs for UE ROM, and edema control Baseline: No current HEPs Goal status: INITIAL    LONG TERM GOALS: Target date: 10/27/2021    Pt. Will improve right hand edema by 1 cm at the wrist, and MCPs Baseline: Eval: Wrist: 18.5cm, MCPs: 21.5cm Goal status: INITIAL  2.  Pt. Will improve right shoulder PROM by 10 degrees with ADLs Baseline: Eval: PROM shoulder flexion: 30 degrees, Abduction: 40 degrees Goal status: INITIAL  3.  Pt. Will improve  right elbow PROM by 10 degrees to prepare for hand to mouth patterns Baseline: Eval:   elbow extension -67, flexion 120 Goal status: INITIAL  4.  Pt. Will improve PROM right wrist flexion, and extension by 10 degrees Baseline: Eval: wrist extension 12 degrees, flexion to neutral Goal status: INITIAL  5.  Pt. Will tolerate full digit extension in the right hand to promote good skin integrity through the palmar surface of the right hand  Baseline: Pt. Maintains digits in tight flexion Goal status: INITIAL  6.  Pt. Will require minA UE dressing using one armed dressing techniques. Baseline: MaxA Goal status: INITIAL  ASSESSMENT:  CLINICAL IMPRESSION:  Pt. reports that the palm protector has been going very well. Pt.'s right shoulder flexion, and abduction, and digit  extension ROM continues to be  limited by pain, however pt. was able to tolerate increased shoulder abduction, and digit extension today. Pt. was able to tolerate slightly more abduction today following moist heat pack modality to the  right shoulder.  Pt.initially maintains the digits on her right hand in tight flexion. Pt. was able to tolerate retrograde massage for edema control, and ROM to the right hand, and digits. Pt. continues to work on improving pain, and improving RUE ROM in order to improve engagement with ADLs/IADLs, and promote good skin integrity through the palmar surface of the hand.  PERFORMANCE DEFICITS in functional skills including ADLs, IADLs, coordination, proprioception, sensation, edema, tone, ROM, strength, pain, FMC, GMC, balance, decreased knowledge of use of DME, skin integrity, and UE functional use, cognitive skills including attention, memory, and safety awareness, and psychosocial skills including coping strategies.   IMPAIRMENTS are limiting patient from ADLs, IADLs, education, work, leisure, and social participation.   COMORBIDITIES may have co-morbidities  that affects occupational performance.  Patient will benefit from skilled OT to address above impairments and improve overall function.  MODIFICATION OR ASSISTANCE TO COMPLETE EVALUATION: Maximum modification of tasks or assist with assess necessary to complete an evaluation.  OT OCCUPATIONAL PROFILE AND HISTORY: Detailed assessment: Review of records and additional review of physical, cognitive, psychosocial history related to current functional performance.  CLINICAL DECISION MAKING: High - multiple treatment options, significant modification of task necessary  REHAB POTENTIAL: Good for stated goals  EVALUATION COMPLEXITY: High    PLAN: OT FREQUENCY: 2x/week  OT DURATION: 12 weeks  PLANNED INTERVENTIONS: self care/ADL training, therapeutic exercise, therapeutic activity, neuromuscular re-education, manual therapy, passive range of motion, paraffin, moist heat, contrast bath, patient/family education, cognitive remediation/compensation, energy conservation, and coping strategies training  RECOMMENDED OTHER SERVICES: PT, and ST  CONSULTED AND AGREED WITH PLAN OF CARE: Patient and family member/caregiver  PLAN FOR NEXT SESSION: Initiate PROM, and RUE Edema control techniques   Harrel Carina, MS, OTR/L  Harrel Carina, OT 08/18/2021, 4:52 PM

## 2021-08-20 ENCOUNTER — Encounter: Payer: Self-pay | Admitting: Occupational Therapy

## 2021-08-20 ENCOUNTER — Ambulatory Visit: Payer: Medicare Other | Admitting: Speech Pathology

## 2021-08-20 ENCOUNTER — Ambulatory Visit: Payer: Medicare Other | Admitting: Occupational Therapy

## 2021-08-20 DIAGNOSIS — M6281 Muscle weakness (generalized): Secondary | ICD-10-CM | POA: Diagnosis not present

## 2021-08-20 DIAGNOSIS — R4701 Aphasia: Secondary | ICD-10-CM

## 2021-08-20 DIAGNOSIS — R278 Other lack of coordination: Secondary | ICD-10-CM

## 2021-08-20 DIAGNOSIS — C719 Malignant neoplasm of brain, unspecified: Secondary | ICD-10-CM

## 2021-08-20 NOTE — Therapy (Signed)
OUTPATIENT OCCUPATIONAL THERAPY TREATMENT  Patient Name: Kelly Rollins MRN: [id number] DOB:[birth year], 51 y.o., female Today's Date: 08/20/2021  PCP: [name], PA-C REFERRING PROVIDER: [name], Corrine Jeani Hawking, PA-C   OT End of Session - 08/20/21 1422     Visit Number 6    Number of Visits 24    Date for OT Re-Evaluation 10/27/21    OT Start Time 3428    OT Stop Time 1345    OT Time Calculation (min) 38 min    Activity Tolerance Patient tolerated treatment well    Behavior During Therapy [facility] for tasks assessed/performed             Past Medical History:  Diagnosis Date   ABDOMINAL PAIN OTHER SPECIFIED SITE 02/21/2009   Qualifier: Diagnosis of  By: Deborra Medina MD, Talia     ABDOMINAL PAIN-LUQ 04/17/2009   Qualifier: Diagnosis of  By: Fuller Plan MD Lamont Snowball T    Anal fissure    Anginal pain ([place])    Asthma    Depression    Diabetes mellitus without complication ([place])    Dyspnea    Dysrhythmia    Headache(784.0)    HEMATOCHEZIA 04/17/2009   Qualifier: Diagnosis of  By: Fuller Plan MD Lamont Snowball T    Hyperlipidemia    Hypertension    IBS (irritable bowel syndrome)    Migraine headache 11/06/2008   Qualifier: Diagnosis of  By: Deborra Medina MD, Talia     OVARIAN CYST, RIGHT 02/21/2009   Qualifier: Diagnosis of  By: Deborra Medina MD, Talia     PALPITATIONS, OCCASIONAL 04/17/2010   Qualifier: Diagnosis of  By: Deborra Medina MD, Talia     Pancreatitis 10/14/2017   Past Surgical History:  Procedure Laterality Date   BREAST EXCISIONAL BIOPSY Left 1997   ? left side, done at time of reduction , benign   BREAST REDUCTION SURGERY     bilateral   CESAREAN SECTION     CHOLECYSTECTOMY     COLONOSCOPY     ESOPHAGOGASTRODUODENOSCOPY     ESOPHAGOGASTRODUODENOSCOPY (EGD) WITH PROPOFOL N/A 10/21/2020   Procedure: ESOPHAGOGASTRODUODENOSCOPY (EGD) WITH PROPOFOL;  Surgeon: Lesly Rubenstein, MD;  Location: [facility] ENDOSCOPY;  Service: Endoscopy;  Laterality: N/A;   REDUCTION MAMMAPLASTY Bilateral  1997   VAGINAL HYSTERECTOMY     Patient Active Problem List   Diagnosis Date Noted   Abnormal MRI, lumbar spine (01/29/2020) 02/19/2020   Anxiety due to MRI 01/25/2020   Claustrophobia associated to MRI 01/25/2020   Enthesopathy of knee region (Right) 01/25/2020   Enthesopathy of knee region (Left) 01/25/2020   Lumbar facet hypertrophy 01/25/2020   Lumbosacral radiculopathy at S1 (Left) 01/17/2020   Lumbosacral radiculopathy (S1) (Left) 01/17/2020   Chronic lower extremity pain (Bilateral) (L>R) 01/17/2020   History of pancreatitis 07/18/2019   Obstructive sleep apnea syndrome 01/02/2019   Lumbar facet arthropathy (Multilevel) (Bilateral) 12/14/2018   Osteoarthritis involving multiple joints 11/28/2018   Chronic hip pain (Left) 11/16/2018   Acute hip pain (Left) 11/16/2018   DDD (degenerative disc disease), lumbosacral 11/16/2018   Chronic hip pain (Secondary source of pain) (Bilateral) (L>R) 05/19/2017   Elevated C-reactive protein (CRP) 05/19/2017   Elevated sed rate 05/19/2017   Spondylosis without myelopathy or radiculopathy, lumbosacral region 05/19/2017   Pharmacologic therapy 05/06/2017   Disorder of skeletal system 05/06/2017   Problems influencing health status 05/06/2017   Chest pain with moderate risk of acute coronary syndrome 04/21/2017   DOE (dyspnea on exertion) 04/21/2017   Intermittent palpitations 04/21/2017  Vitamin D insufficiency 05/04/2016   Depression 02/04/2016   Fatty liver 02/04/2016   Hypertension associated with diabetes ([place]) 02/04/2016   Chronic pain syndrome 01/15/2016   Acute low back pain 05/28/2015   Lumbar transverse process fracture (HCC) (Left L1) (03/11/15) 05/28/2015   Long term current use of opiate analgesic 05/01/2015   Long term prescription opiate use 05/01/2015   Opiate use (25 MME/Day) 05/01/2015   Encounter for therapeutic drug level monitoring 05/01/2015   Encounter for pain management planning 05/01/2015   Chronic low back  pain (Primary Source of Pain) (Bilateral) (L>R) 05/01/2015   Lumbar facet syndrome (Bilateral) (L>R) 05/01/2015   Chronic knee pain [facility] source of pain) (Bilateral) (L>R) 05/01/2015   Lumbar spondylosis 05/01/2015   Neuropathic pain 05/01/2015   Neurogenic pain 05/01/2015   Myofascial pain 05/01/2015   Essential hypertriglyceridemia 04/26/2014   Type 2 diabetes mellitus with hyperglycemia, with long-term current use of insulin ([place]) 04/26/2014   Anxiety 05/15/2010   Obesity 05/15/2010   Hyperlipidemia 04/03/2010   Fatty liver disease 04/02/2010   Pure hypercholesterolemia 03/19/2010   GERD 04/17/2009   Constipation 04/17/2009   TOBACCO ABUSE 11/06/2008    ONSET DATE: 02/06/2021  REFERRING DIAG: Astrocytoma  THERAPY DIAG:  Muscle weakness (generalized)  Other lack of coordination  Rationale for Evaluation and Treatment Rehabilitation  SUBJECTIVE: Pt. was present with her husband for therapy today.  SUBJECTIVE STATEMENT: Pt. Reports having had a busy weekend, however reports that it was very fatiguing. Pt accompanied by: significant other  PERTINENT HISTORY:  Pt. Is a 51 y.o. 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 PMHx includes: Anorexia, dehydration, Adult Failure to thrive, contracture of muscles multiple sites. Pt./caregiver reports recently being hospitalized with a UTI, and dehydration.   PRECAUTIONS: Fall  WEIGHT BEARING RESTRICTIONS No  PAIN:  Are you having pain?  Pt. Reports 3/10 pain in the right UE, and digits.  FALLS: Has patient fallen in last 6 months? Yes. Number of falls Multiple Falls  LIVING ENVIRONMENT: Lives with: Family Lives in: House/apartment Stairs: Yes: External: 4 steps; bilateral but cannot reach both Has following equipment at home: Single point cane, Walker - 4 wheeled, Hemi walker, shower chair, bed side commode, and Grab bars Lift chair in  the tub, [place],  PLOF: Independent  PATIENT GOALS: To improve the right UE  OBJECTIVE:   HAND DOMINANCE: Right  ADLs: Transfers/ambulation related to ADLs: Eating: MaxA with cutting food, setting up, Modified I with nondominant Left hand, [place]  Grooming: Independent  UB Dressing: MaxA  LB Dressing: MaxA Toileting: Uses a BSCommode mInA transfers, MaxA clothing negotiation Bathing: MaxA Tub Shower transfers:  Uses a shower lift chair Equipment:  w/c, BSCommode, cane   IADLs: Shopping: Husband performs Light housekeeping: Max Meal Prep: MaxA Community mobility: Relies on family Medication management:Husband assists Doctor, hospital: Performs together Handwriting: Not legible  MOBILITY STATUS: Hx of falls    ACTIVITY TOLERANCE: Activity tolerance: limited  FUNCTIONAL OUTCOME MEASURES: FOTO: 11.76  UPPER EXTREMITY ROM  : WFL Left AROM/PROM   Passive ROM Right eval Right  08/18/21  Shoulder flexion 30   Shoulder abduction 40 62  Shoulder adduction    Shoulder extension    Shoulder internal rotation    Shoulder external rotation    Elbow flexion 120   Elbow extension -67   Wrist flexion Neutral   Wrist extension 12   Wrist ulnar deviation  Wrist radial deviation    Wrist pronation    Wrist supination    (Blank rows = not tested)   UPPER EXTREMITY MMT:     MMT Right eval Left eval  Shoulder flexion 0/5 4+/5  Shoulder abduction 0/5 4+/5  Shoulder adduction    Shoulder extension    Shoulder internal rotation    Shoulder external rotation    Middle trapezius    Lower trapezius    Elbow flexion 0/5 4+/5  Elbow extension 0/5 4+/5  Wrist flexion    Wrist extension 0/5 4+/5  Wrist ulnar deviation    Wrist radial deviation    Wrist pronation    Wrist supination    (Blank rows = not tested)    SENSATION: Light touch: WFL  EDEMA:   Wrist: 18.5 cm MCP: 21.5 cm    MUSCLE TONE: RUE: Severe  COGNITION: Overall cognitive status:  Impaired  VISION: Subjective report: Wears glasses Baseline vision: Wears glasses all the time Pt. Reports no changes    PRAXIS: Impaired: Motor planning     TODAY'S TREATMENT:   Pt. tolerated retrograde massage to the right hand, and digits secondary to edema. Pt. tolerated soft tissue massage to the muscles in the right scapular region prior to, and in preparation for ROM, and therapeutic Ex. Pt. tolerated PROM for right wrist flexion and extension, digit flexion and extension, and thumb abduction. Pt./caregiver education was provided about edema control techniques, ROM, positioning in right shoulder abduction with a pillow when in sitting. Pt. Continues to tolerate the palm protector with  1" roll without skin irritation, or breakdown in the right palm.  Pt. e   PATIENT EDUCATION: Education details: Right UE ROM, positioning of the RUE Person educated: Patient and Spouse Education method: Explanation, Demonstration, Tactile cues, and Verbal cues Education comprehension: verbalized understanding, returned demonstration, and needs further education   HOME EXERCISE PROGRAM:   Pt./caregiver education about positioning of the RUE, ROM    GOALS: Goals reviewed with patient? Yes  SHORT TERM GOALS: Target date: 09/15/2021    Pt./caregivers with demonstrate independence with HEPs for UE ROM, and edema control Baseline: No current HEPs Goal status: INITIAL    LONG TERM GOALS: Target date: 10/27/2021    Pt. Will improve right hand edema by 1 cm at the wrist, and MCPs Baseline: Eval: Wrist: 18.5cm, MCPs: 21.5cm Goal status: INITIAL  2.  Pt. Will improve right shoulder PROM by 10 degrees with ADLs Baseline: Eval: PROM shoulder flexion: 30 degrees, Abduction: 40 degrees Goal status: INITIAL  3.  Pt. Will improve right elbow PROM by 10 degrees to prepare for hand to mouth patterns Baseline: Eval:   elbow extension -67, flexion 120 Goal status: INITIAL  4.  Pt. Will  improve PROM right wrist flexion, and extension by 10 degrees Baseline: Eval: wrist extension 12 degrees, flexion to neutral Goal status: INITIAL  5.  Pt. Will tolerate full digit extension in the right hand to promote good skin integrity through the palmar surface of the right hand  Baseline: Pt. Maintains digits in tight flexion Goal status: INITIAL  6.  Pt. Will require minA UE dressing using one armed dressing techniques. Baseline: MaxA Goal status: INITIAL  ASSESSMENT:  CLINICAL IMPRESSION:  Pt. reports that the palm protector continues to help. Pt.'s right shoulder flexion, and abduction, and digit extension ROM continues to be  limited by pain, however pt. was able to tolerate increased shoulder abduction, and digit extension today. Pt. was able to tolerate  slightly more abduction today following moist heat pack modality to the right shoulder.  Pt. initially maintains the digits on her right hand in tight flexion. Pt. was able to tolerate retrograde massage for edema control, and ROM to the right hand, and digits. Pt. continues to work on improving pain, and improving RUE ROM in order to improve engagement with ADLs/IADLs, and promote good skin integrity through the palmar surface of the hand.  PERFORMANCE DEFICITS in functional skills including ADLs, IADLs, coordination, proprioception, sensation, edema, tone, ROM, strength, pain, FMC, GMC, balance, decreased knowledge of use of DME, skin integrity, and UE functional use, cognitive skills including attention, memory, and safety awareness, and psychosocial skills including coping strategies.   IMPAIRMENTS are limiting patient from ADLs, IADLs, education, work, leisure, and social participation.   COMORBIDITIES may have co-morbidities  that affects occupational performance. Patient will benefit from skilled OT to address above impairments and improve overall function.  MODIFICATION OR ASSISTANCE TO COMPLETE EVALUATION: Maximum  modification of tasks or assist with assess necessary to complete an evaluation.  OT OCCUPATIONAL PROFILE AND HISTORY: Detailed assessment: Review of records and additional review of physical, cognitive, psychosocial history related to current functional performance.  CLINICAL DECISION MAKING: High - multiple treatment options, significant modification of task necessary  REHAB POTENTIAL: Good for stated goals  EVALUATION COMPLEXITY: High    PLAN: OT FREQUENCY: 2x/week  OT DURATION: 12 weeks  PLANNED INTERVENTIONS: self care/ADL training, therapeutic exercise, therapeutic activity, neuromuscular re-education, manual therapy, passive range of motion, paraffin, moist heat, contrast bath, patient/family education, cognitive remediation/compensation, energy conservation, and coping strategies training  RECOMMENDED OTHER SERVICES: PT, and ST  CONSULTED AND AGREED WITH PLAN OF CARE: Patient and family member/caregiver  PLAN FOR NEXT SESSION: Initiate PROM, and RUE Edema control techniques   Harrel Carina, MS, OTR/L  Harrel Carina, OT 08/20/2021, 2:26 PM

## 2021-08-21 ENCOUNTER — Encounter: Payer: Self-pay | Admitting: Speech Pathology

## 2021-08-22 NOTE — Therapy (Signed)
OUTPATIENT SPEECH LANGUAGE PATHOLOGY APHASIA EVALUATION   Patient Name: Kelly Rollins MRN: [id number] DOB:09-Aug-1970, 51 y.o., female Today's Date: 08/22/2021  PCP: Grayland Ormond, PA REFERRING PROVIDER: Morrell Riddle, PA   End of Session - 08/21/21 1536     Visit Number 1    Number of Visits 25    Date for SLP Re-Evaluation 11/12/21    Authorization Type [employer]    Progress Note Due on Visit 10    SLP Start Time 1345    SLP Stop Time  1445    SLP Time Calculation (min) 60 min    Activity Tolerance Patient tolerated treatment well             Past Medical History:  Diagnosis Date   ABDOMINAL PAIN OTHER SPECIFIED SITE 02/21/2009   Qualifier: Diagnosis of  By: Deborra Medina MD, Talia     ABDOMINAL PAIN-LUQ 04/17/2009   Qualifier: Diagnosis of  By: Fuller Plan MD Lamont Snowball T    Anal fissure    Anginal pain ([place])    Asthma    Depression    Diabetes mellitus without complication ([place])    Dyspnea    Dysrhythmia    Headache(784.0)    HEMATOCHEZIA 04/17/2009   Qualifier: Diagnosis of  By: Fuller Plan MD Lamont Snowball T    Hyperlipidemia    Hypertension    IBS (irritable bowel syndrome)    Migraine headache 11/06/2008   Qualifier: Diagnosis of  By: Deborra Medina MD, Talia     OVARIAN CYST, RIGHT 02/21/2009   Qualifier: Diagnosis of  By: Deborra Medina MD, Talia     PALPITATIONS, OCCASIONAL 04/17/2010   Qualifier: Diagnosis of  By: Deborra Medina MD, Talia     Pancreatitis 10/14/2017   Past Surgical History:  Procedure Laterality Date   BREAST EXCISIONAL BIOPSY Left 1997   ? left side, done at time of reduction , benign   BREAST REDUCTION SURGERY     bilateral   CESAREAN SECTION     CHOLECYSTECTOMY     COLONOSCOPY     ESOPHAGOGASTRODUODENOSCOPY     ESOPHAGOGASTRODUODENOSCOPY (EGD) WITH PROPOFOL N/A 10/21/2020   Procedure: ESOPHAGOGASTRODUODENOSCOPY (EGD) WITH PROPOFOL;  Surgeon: Lesly Rubenstein, MD;  Location: [facility] ENDOSCOPY;  Service: Endoscopy;  Laterality: N/A;   REDUCTION  MAMMAPLASTY Bilateral 1997   VAGINAL HYSTERECTOMY     Patient Active Problem List   Diagnosis Date Noted   Abnormal MRI, lumbar spine (01/29/2020) 02/19/2020   Anxiety due to MRI 01/25/2020   Claustrophobia associated to MRI 01/25/2020   Enthesopathy of knee region (Right) 01/25/2020   Enthesopathy of knee region (Left) 01/25/2020   Lumbar facet hypertrophy 01/25/2020   Lumbosacral radiculopathy at S1 (Left) 01/17/2020   Lumbosacral radiculopathy (S1) (Left) 01/17/2020   Chronic lower extremity pain (Bilateral) (L>R) 01/17/2020   History of pancreatitis 07/18/2019   Obstructive sleep apnea syndrome 01/02/2019   Lumbar facet arthropathy (Multilevel) (Bilateral) 12/14/2018   Osteoarthritis involving multiple joints 11/28/2018   Chronic hip pain (Left) 11/16/2018   Acute hip pain (Left) 11/16/2018   DDD (degenerative disc disease), lumbosacral 11/16/2018   Chronic hip pain (Secondary source of pain) (Bilateral) (L>R) 05/19/2017   Elevated C-reactive protein (CRP) 05/19/2017   Elevated sed rate 05/19/2017   Spondylosis without myelopathy or radiculopathy, lumbosacral region 05/19/2017   Pharmacologic therapy 05/06/2017   Disorder of skeletal system 05/06/2017   Problems influencing health status 05/06/2017   Chest pain with moderate risk of acute coronary syndrome 04/21/2017   DOE (dyspnea on exertion) 04/21/2017  Intermittent palpitations 04/21/2017   Vitamin D insufficiency 05/04/2016   Depression 02/04/2016   Fatty liver 02/04/2016   Hypertension associated with diabetes ([place]) 02/04/2016   Chronic pain syndrome 01/15/2016   Acute low back pain 05/28/2015   Lumbar transverse process fracture (HCC) (Left L1) (03/11/15) 05/28/2015   Long term current use of opiate analgesic 05/01/2015   Long term prescription opiate use 05/01/2015   Opiate use (25 MME/Day) 05/01/2015   Encounter for therapeutic drug level monitoring 05/01/2015   Encounter for pain management planning 05/01/2015    Chronic low back pain (Primary Source of Pain) (Bilateral) (L>R) 05/01/2015   Lumbar facet syndrome (Bilateral) (L>R) 05/01/2015   Chronic knee pain [facility] source of pain) (Bilateral) (L>R) 05/01/2015   Lumbar spondylosis 05/01/2015   Neuropathic pain 05/01/2015   Neurogenic pain 05/01/2015   Myofascial pain 05/01/2015   Essential hypertriglyceridemia 04/26/2014   Type 2 diabetes mellitus with hyperglycemia, with long-term current use of insulin ([place]) 04/26/2014   Anxiety 05/15/2010   Obesity 05/15/2010   Hyperlipidemia 04/03/2010   Fatty liver disease 04/02/2010   Pure hypercholesterolemia 03/19/2010   GERD 04/17/2009   Constipation 04/17/2009   TOBACCO ABUSE 11/06/2008    ONSET DATE: 02/14/2021 date of initial dx; 08/19/2021 date of referral    REFERRING DIAG: R47.01 (ICD-10-CM) - Aphasia  THERAPY DIAG:  Aphasia  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[place])  Rationale for Evaluation and Treatment Rehabilitation  SUBJECTIVE:   SUBJECTIVE STATEMENT: "I have trouble talking" Pt accompanied by: significant other  PERTINENT HISTORY: PMH T2DM and 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 Colin Mulders in 02/14/21 s/p radiation and TMZ.   DIAGNOSTIC FINDINGS: MRI 07/10/2021 Within limitations of motion artifact, no significant difference in left frontal based mass crossing midline through the corpus callosum. On conventional T1 postcontrast imaging, the previous areas of hyper enhancement appear to have diminished some degree. No definite areas of new involvement. Redemonstrated left frontal stereotactic biopsy tract.    PAIN:  Are you having pain? Yes, bottom, repositioning pt brought relief, monitored during session.  FALLS: Has patient fallen in last 6 months?  No  LIVING ENVIRONMENT: Lives with: lives with their spouse Lives in: House/apartment  PLOF:  Level of assistance: Independent with  ADLs, Independent with IADLs Employment: Full-time employment   PATIENT GOALS to be able to speak fluently  OBJECTIVE:   COGNITION: Overall cognitive status: Within functional limits for tasks assessed  AUDITORY COMPREHENSION: Overall auditory comprehension: Appears intact YES/NO questions: Appears intact Following directions: Appears intact Conversation: Simple  READING COMPREHENSION: Impaired: sentence  EXPRESSION: verbal  VERBAL EXPRESSION: Level of generative/spontaneous verbalization: phrase Automatic speech: name: intact, social response: impaired, and counting: impaired  Repetition: Appears intact Naming: Responsive: 76-100%, Confrontation: 76-100%, Convergent: 76-100%, and Divergent: 0-25% Pragmatics: Appears intact Interfering components:  N/A Effective technique: open ended questions, semantic cues, and sentence completion Non-verbal means of communication: N/A  WRITTEN EXPRESSION: Dominant hand: right  Written expression:  Not tested d/t RUE weakness  MOTOR SPEECH: Overall motor speech: impaired Level of impairment: Conversation Respiration: diaphragmatic/abdominal breathing Phonation: normal Resonance: WFL Articulation: Appears intact Intelligibility: Intelligible Motor planning: Appears intact Motor speech errors:  N/A   ORAL MOTOR EXAMINATION Facial : Strength impaired: Impaired right Lingual: Strength Impaired: Impaired right Velum: WFL Mandible: WFL Cough: WFL Voice: WFL   STANDARDIZED ASSESSMENTS: Word Fluency: Severely below standard    TODAY'S TREATMENT:  Skilled treatment session focused on strategies  to utilize during communication breakdowns. SLP introduced verbal description and gestures. Pt able to demonstrate initial use in hypothetical word finding drills. In addition, her husband also voiced understanding and will remind pt to begin using some strategies.    PATIENT EDUCATION: Education details: results of this assessment; ST  POC; expressive aphasia Person educated: Patient and Spouse Education method: Explanation, Demonstration, Verbal cues, and Handouts Education comprehension: verbalized understanding and needs further education   GOALS: Goals reviewed with patient? Yes  SHORT TERM GOALS: Target date: 10 sessions  Given basic category, pt will generate at least 6 members in 7 out of 10 opportunities with minimal cues.  Baseline: 3 members in basic category Goal status: INITIAL  2.  Pt will demonstrate use of at least 1 word finding strategy to aid in communication breakdowns across 5 sessions with minimal cues.  Baseline: introduced concept 08/20/2021 Goal status: INITIAL  3.  Pt will expressively produce 4+ word sentence in response to situation/picture with 70% accuracy given minimal cues in order to increase ability to communicate basic wants needs. Baseline: 3 word Goal status: INITIAL   LONG TERM GOALS: Target date: 11/12/2021  Pt and caregiver will utilize word finding strategies to communicate pt's basic wants/needs in 5 out of 10 opportunities.  Baseline: introduced 08/20/2021 Goal status: INITIAL   ASSESSMENT:  CLINICAL IMPRESSION: Patient is a 51  y.o. female who was seen today for speech-language evaluation d/t report of communication difficulties. Pt presents with moderate Broca's Aphasia secondary to location of the astrocytoma. Pt's expressive communication can be described as halting with evidence of severe word finding difficulty. When performing naming tasks, pt demonstrated functional abilities during confrontation, responsive, and convergent naming. Pt demonstrated severe impairment with divergent naming as she was able to list 3 animals and 3 modes of transportation. This improved to 7 with semantic feature cues.   OBJECTIVE IMPAIRMENTS include expressive language and aphasia. These impairments are limiting patient from household responsibilities, ADLs/IADLs, and effectively  communicating at home and in community. Factors affecting potential to achieve goals and functional outcome are medical prognosis. Patient will benefit from skilled SLP services to address above impairments and improve overall function.  REHAB POTENTIAL: Fair - astrocytoma   PLAN: SLP FREQUENCY: 2x/week  SLP DURATION: 12 weeks  PLANNED INTERVENTIONS: Language facilitation, SLP instruction and feedback, and Patient/family education    Amandy Chubbuck B. Rutherford Nail, M.S., CCC-SLP, Mining engineer Certified Brain Injury [place]  [place] Office [phone] Ascom [phone] Fax [phone]

## 2021-08-25 ENCOUNTER — Encounter: Payer: Self-pay | Admitting: Occupational Therapy

## 2021-08-25 ENCOUNTER — Encounter: Payer: Medicare Other | Admitting: Speech Pathology

## 2021-08-25 ENCOUNTER — Ambulatory Visit: Payer: Medicare Other | Admitting: Speech Pathology

## 2021-08-25 ENCOUNTER — Ambulatory Visit: Payer: Medicare Other | Admitting: Occupational Therapy

## 2021-08-25 DIAGNOSIS — M6281 Muscle weakness (generalized): Secondary | ICD-10-CM

## 2021-08-25 DIAGNOSIS — C719 Malignant neoplasm of brain, unspecified: Secondary | ICD-10-CM

## 2021-08-25 DIAGNOSIS — R41841 Cognitive communication deficit: Secondary | ICD-10-CM

## 2021-08-25 NOTE — Therapy (Addendum)
OUTPATIENT OCCUPATIONAL THERAPY TREATMENT  Patient Name: Kelly Rollins MRN: [id number] DOB:Mar 01, 1970, 51 y.o., female Today's Date: 08/25/2021  PCP: [name], PA-C REFERRING PROVIDER: [name], Corrine Jeani Hawking, PA-C   OT End of Session - 08/25/21 1437     Visit Number 7    Number of Visits 24    Date for OT Re-Evaluation 10/27/21    OT Start Time 1300    OT Stop Time 1345    OT Time Calculation (min) 45 min    Activity Tolerance Patient tolerated treatment well    Behavior During Therapy [facility] for tasks assessed/performed             Past Medical History:  Diagnosis Date   ABDOMINAL PAIN OTHER SPECIFIED SITE 02/21/2009   Qualifier: Diagnosis of  By: Deborra Medina MD, Talia     ABDOMINAL PAIN-LUQ 04/17/2009   Qualifier: Diagnosis of  By: Fuller Plan MD Lamont Snowball T    Anal fissure    Anginal pain ([place])    Asthma    Depression    Diabetes mellitus without complication ([place])    Dyspnea    Dysrhythmia    Headache(784.0)    HEMATOCHEZIA 04/17/2009   Qualifier: Diagnosis of  By: Fuller Plan MD Lamont Snowball T    Hyperlipidemia    Hypertension    IBS (irritable bowel syndrome)    Migraine headache 11/06/2008   Qualifier: Diagnosis of  By: Deborra Medina MD, Talia     OVARIAN CYST, RIGHT 02/21/2009   Qualifier: Diagnosis of  By: Deborra Medina MD, Talia     PALPITATIONS, OCCASIONAL 04/17/2010   Qualifier: Diagnosis of  By: Deborra Medina MD, Talia     Pancreatitis 10/14/2017   Past Surgical History:  Procedure Laterality Date   BREAST EXCISIONAL BIOPSY Left 1997   ? left side, done at time of reduction , benign   BREAST REDUCTION SURGERY     bilateral   CESAREAN SECTION     CHOLECYSTECTOMY     COLONOSCOPY     ESOPHAGOGASTRODUODENOSCOPY     ESOPHAGOGASTRODUODENOSCOPY (EGD) WITH PROPOFOL N/A 10/21/2020   Procedure: ESOPHAGOGASTRODUODENOSCOPY (EGD) WITH PROPOFOL;  Surgeon: Lesly Rubenstein, MD;  Location: [facility] ENDOSCOPY;  Service: Endoscopy;  Laterality: N/A;   REDUCTION MAMMAPLASTY Bilateral  1997   VAGINAL HYSTERECTOMY     Patient Active Problem List   Diagnosis Date Noted   Abnormal MRI, lumbar spine (01/29/2020) 02/19/2020   Anxiety due to MRI 01/25/2020   Claustrophobia associated to MRI 01/25/2020   Enthesopathy of knee region (Right) 01/25/2020   Enthesopathy of knee region (Left) 01/25/2020   Lumbar facet hypertrophy 01/25/2020   Lumbosacral radiculopathy at S1 (Left) 01/17/2020   Lumbosacral radiculopathy (S1) (Left) 01/17/2020   Chronic lower extremity pain (Bilateral) (L>R) 01/17/2020   History of pancreatitis 07/18/2019   Obstructive sleep apnea syndrome 01/02/2019   Lumbar facet arthropathy (Multilevel) (Bilateral) 12/14/2018   Osteoarthritis involving multiple joints 11/28/2018   Chronic hip pain (Left) 11/16/2018   Acute hip pain (Left) 11/16/2018   DDD (degenerative disc disease), lumbosacral 11/16/2018   Chronic hip pain (Secondary source of pain) (Bilateral) (L>R) 05/19/2017   Elevated C-reactive protein (CRP) 05/19/2017   Elevated sed rate 05/19/2017   Spondylosis without myelopathy or radiculopathy, lumbosacral region 05/19/2017   Pharmacologic therapy 05/06/2017   Disorder of skeletal system 05/06/2017   Problems influencing health status 05/06/2017   Chest pain with moderate risk of acute coronary syndrome 04/21/2017   DOE (dyspnea on exertion) 04/21/2017   Intermittent palpitations 04/21/2017  Vitamin D insufficiency 05/04/2016   Depression 02/04/2016   Fatty liver 02/04/2016   Hypertension associated with diabetes ([place]) 02/04/2016   Chronic pain syndrome 01/15/2016   Acute low back pain 05/28/2015   Lumbar transverse process fracture (HCC) (Left L1) (03/11/15) 05/28/2015   Long term current use of opiate analgesic 05/01/2015   Long term prescription opiate use 05/01/2015   Opiate use (25 MME/Day) 05/01/2015   Encounter for therapeutic drug level monitoring 05/01/2015   Encounter for pain management planning 05/01/2015   Chronic low back  pain (Primary Source of Pain) (Bilateral) (L>R) 05/01/2015   Lumbar facet syndrome (Bilateral) (L>R) 05/01/2015   Chronic knee pain [facility] source of pain) (Bilateral) (L>R) 05/01/2015   Lumbar spondylosis 05/01/2015   Neuropathic pain 05/01/2015   Neurogenic pain 05/01/2015   Myofascial pain 05/01/2015   Essential hypertriglyceridemia 04/26/2014   Type 2 diabetes mellitus with hyperglycemia, with long-term current use of insulin ([place]) 04/26/2014   Anxiety 05/15/2010   Obesity 05/15/2010   Hyperlipidemia 04/03/2010   Fatty liver disease 04/02/2010   Pure hypercholesterolemia 03/19/2010   GERD 04/17/2009   Constipation 04/17/2009   TOBACCO ABUSE 11/06/2008    ONSET DATE: 02/06/2021  REFERRING DIAG: Astrocytoma  THERAPY DIAG:  Muscle weakness (generalized)  Rationale for Evaluation and Treatment Rehabilitation  SUBJECTIVE: Pt. was present with her husband for therapy today.  SUBJECTIVE STATEMENT: Pt. Reports having had a nice visit from a longtime friend.  Pt accompanied by: significant other  PERTINENT HISTORY:  Pt. Is a 51 y.o. 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 PMHx includes: Anorexia, dehydration, Adult Failure to thrive, contracture of muscles multiple sites. Pt./caregiver reports recently being hospitalized with a UTI, and dehydration.   PRECAUTIONS: Fall  WEIGHT BEARING RESTRICTIONS No  PAIN:  Are you having pain?  Pt. Reports 2/10 pain in the right UE, and digits. Pt reports 2/10 pain, however pt. intermittent facial, and verbal responses reflect increased pain levels.  FALLS: Has patient fallen in last 6 months? Yes. Number of falls Multiple Falls  LIVING ENVIRONMENT: Lives with: Family Lives in: House/apartment Stairs: Yes: External: 4 steps; bilateral but cannot reach both Has following equipment at home: Single point cane, Walker - 4 wheeled, Hemi walker,  shower chair, bed side commode, and Grab bars Lift chair in the tub, [place],  PLOF: Independent  PATIENT GOALS: To improve the right UE  OBJECTIVE:   HAND DOMINANCE: Right  ADLs: Transfers/ambulation related to ADLs: Eating: MaxA with cutting food, setting up, Modified I with nondominant Left hand, [place]  Grooming: Independent  UB Dressing: MaxA  LB Dressing: MaxA Toileting: Uses a BSCommode mInA transfers, MaxA clothing negotiation Bathing: MaxA Tub Shower transfers:  Uses a shower lift chair Equipment:  w/c, BSCommode, cane   IADLs: Shopping: Husband performs Light housekeeping: Max Meal Prep: MaxA Community mobility: Relies on family Medication management:Husband assists Doctor, hospital: Performs together Handwriting: Not legible  MOBILITY STATUS: Hx of falls    ACTIVITY TOLERANCE: Activity tolerance: limited  FUNCTIONAL OUTCOME MEASURES: FOTO: 11.76  UPPER EXTREMITY ROM  : WFL Left AROM/PROM   Passive ROM Right eval Right  08/18/21  Shoulder flexion 30   Shoulder abduction 40 62  Shoulder adduction    Shoulder extension    Shoulder internal rotation    Shoulder external rotation    Elbow flexion 120   Elbow extension -67   Wrist flexion Neutral   Wrist  extension 12   Wrist ulnar deviation    Wrist radial deviation    Wrist pronation    Wrist supination    (Blank rows = not tested)   UPPER EXTREMITY MMT:     MMT Right eval Left eval  Shoulder flexion 0/5 4+/5  Shoulder abduction 0/5 4+/5  Shoulder adduction    Shoulder extension    Shoulder internal rotation    Shoulder external rotation    Middle trapezius    Lower trapezius    Elbow flexion 0/5 4+/5  Elbow extension 0/5 4+/5  Wrist flexion    Wrist extension 0/5 4+/5  Wrist ulnar deviation    Wrist radial deviation    Wrist pronation    Wrist supination    (Blank rows = not tested)    SENSATION: Light touch: WFL  EDEMA:   Wrist: 18.5 cm MCP: 21.5  cm    MUSCLE TONE: RUE: Severe  COGNITION: Overall cognitive status: Impaired  VISION: Subjective report: Wears glasses Baseline vision: Wears glasses all the time Pt. Reports no changes    PRAXIS: Impaired: Motor planning     TODAY'S TREATMENT:   Pt. tolerated retrograde massage to the right hand, and digits secondary to edema. Pt. tolerated soft tissue massage to the muscles in the right scapular region. Manual therapy was perormed prior to, and in preparation for ROM, and therapeutic Ex. Pt. tolerated PROM for right wrist flexion and extension, digit flexion and extension, and thumb abduction. Pt./caregiver education was provided about  the  right hand palm protector, edema control techniques, ROM, positioning in right shoulder. Pt. Continues to tolerate the palm protector with  1" roll without skin irritation, or breakdown in the right palm.   Pt. reports that the palm protector continues to help, however has had a episode at home that her hand cramped into tight flexion, and was not able to fit the palm protector with the 1" roll in place at that time. Pt./caregiver education was provided about removing the 1" roll from the palm protector, and using the palm protector without it when experiencing tight flexor synergy pattern.  Pt./caregiver reports that at times the pt. Is not able tolerate ROM at home.  Pt.'s right shoulder flexion, and abduction, and digit extension ROM continues to be  limited by pain, however pt. was able to tolerate increased shoulder abduction, and digit extension today. Pt reports 2/10 pain, however pt. Intermittent facial, and verbal responses reflect increased pain levels. Pt. was able to tolerate slightly more abduction today following moist heat pack modality to the right shoulder.  Pt. continues to initially maintain the digits on her right hand in tight flexor synergy pattern. Pt. was able to tolerate retrograde massage for edema control, and ROM to the  right hand, and digits. Pt. continues to work on improving pain, and improving RUE ROM in order to improve engagement with ADLs/IADLs, and promote good skin integrity through the palmar surface of the hand   PATIENT EDUCATION: Education details: Right UE ROM, positioning of the RUE Person educated: Patient and Spouse Education method: Explanation, Demonstration, Tactile cues, and Verbal cues Education comprehension: verbalized understanding, returned demonstration, and needs further education   HOME EXERCISE PROGRAM:   Pt./caregiver education about positioning of the RUE, ROM    GOALS: Goals reviewed with patient? Yes  SHORT TERM GOALS: Target date: 09/15/2021    Pt./caregivers with demonstrate independence with HEPs for UE ROM, and edema control Baseline: No current HEPs Goal status: INITIAL  LONG TERM GOALS: Target date: 10/27/2021    Pt. Will improve right hand edema by 1 cm at the wrist, and MCPs Baseline: Eval: Wrist: 18.5cm, MCPs: 21.5cm Goal status: INITIAL  2.  Pt. Will improve right shoulder PROM by 10 degrees with ADLs Baseline: Eval: PROM shoulder flexion: 30 degrees, Abduction: 40 degrees Goal status: INITIAL  3.  Pt. Will improve right elbow PROM by 10 degrees to prepare for hand to mouth patterns Baseline: Eval:   elbow extension -67, flexion 120 Goal status: INITIAL  4.  Pt. Will improve PROM right wrist flexion, and extension by 10 degrees Baseline: Eval: wrist extension 12 degrees, flexion to neutral Goal status: INITIAL  5.  Pt. Will tolerate full digit extension in the right hand to promote good skin integrity through the palmar surface of the right hand  Baseline: Pt. Maintains digits in tight flexion Goal status: INITIAL  6.  Pt. Will require minA UE dressing using one armed dressing techniques. Baseline: MaxA Goal status: INITIAL  ASSESSMENT:  CLINICAL IMPRESSION:  Pt. reports that the palm protector continues to help, however has had  a episode at home that her hand cramped into tight flexion, and was not able to fit the palm protector with the 1" roll in place at that time. Pt./caregiver education was provided about removing the 1" roll from the palm protector, and using the palm protector without it when experiencing tight flexor synergy pattern.  Pt./care giver reports that at times the pt. Is not able tolerate ROM at home.  Pt.'s right shoulder flexion, and abduction, and digit extension ROM continues to be  limited by pain, however pt. was able to tolerate increased shoulder abduction, and digit extension today. Pt reports 2/10 pain, however pt. Intermittent facial, and verbal responses reflect increased pain levels. Pt. was able to tolerate slightly more abduction today following moist heat pack modality to the right shoulder.  Pt. continues to initially maintain the digits on her right hand in tight flexor synergy pattern. Pt. was able to tolerate retrograde massage for edema control, and ROM to the right hand, and digits. Pt. continues to work on improving pain, and improving RUE ROM in order to improve engagement with ADLs/IADLs, and promote good skin integrity through the palmar surface of the hand.  PERFORMANCE DEFICITS in functional skills including ADLs, IADLs, coordination, proprioception, sensation, edema, tone, ROM, strength, pain, FMC, GMC, balance, decreased knowledge of use of DME, skin integrity, and UE functional use, cognitive skills including attention, memory, and safety awareness, and psychosocial skills including coping strategies.   IMPAIRMENTS are limiting patient from ADLs, IADLs, education, work, leisure, and social participation.   COMORBIDITIES may have co-morbidities  that affects occupational performance. Patient will benefit from skilled OT to address above impairments and improve overall function.  MODIFICATION OR ASSISTANCE TO COMPLETE EVALUATION: Maximum modification of tasks or assist with assess  necessary to complete an evaluation.  OT OCCUPATIONAL PROFILE AND HISTORY: Detailed assessment: Review of records and additional review of physical, cognitive, psychosocial history related to current functional performance.  CLINICAL DECISION MAKING: High - multiple treatment options, significant modification of task necessary  REHAB POTENTIAL: Good for stated goals  EVALUATION COMPLEXITY: High    PLAN: OT FREQUENCY: 2x/week  OT DURATION: 12 weeks  PLANNED INTERVENTIONS: self care/ADL training, therapeutic exercise, therapeutic activity, neuromuscular re-education, manual therapy, passive range of motion, paraffin, moist heat, contrast bath, patient/family education, cognitive remediation/compensation, energy conservation, and coping strategies training  RECOMMENDED OTHER SERVICES: PT, and ST  CONSULTED AND AGREED WITH PLAN OF CARE: Patient and family member/caregiver  PLAN FOR NEXT SESSION: Initiate PROM, and RUE Edema control techniques   Harrel Carina, MS, OTR/L  Harrel Carina, OT 08/25/2021, 2:39 PM

## 2021-08-25 NOTE — Therapy (Signed)
OUTPATIENT SPEECH LANGUAGE PATHOLOGY TREATMENT NOTE   Patient Name: Kelly Rollins MRN: [id number] DOB:Jun 05, 1970, 51 y.o., female Today's Date: 08/25/2021  PCP: Grayland Ormond, PA REFERRING PROVIDER: Margarita Grizzle, NP  END OF SESSION:   End of Session - 08/25/21 1437     Visit Number 2    Number of Visits 25    Date for SLP Re-Evaluation 11/13/21    Authorization Type [employer]    Progress Note Due on Visit 10    SLP Start Time 1345    SLP Stop Time  1435    SLP Time Calculation (min) 50 min    Activity Tolerance Patient tolerated treatment well             Past Medical History:  Diagnosis Date   ABDOMINAL PAIN OTHER SPECIFIED SITE 02/21/2009   Qualifier: Diagnosis of  By: Deborra Medina MD, Talia     ABDOMINAL PAIN-LUQ 04/17/2009   Qualifier: Diagnosis of  By: Fuller Plan MD Lamont Snowball T    Anal fissure    Anginal pain ([place])    Asthma    Depression    Diabetes mellitus without complication ([place])    Dyspnea    Dysrhythmia    Headache(784.0)    HEMATOCHEZIA 04/17/2009   Qualifier: Diagnosis of  By: Fuller Plan MD Lamont Snowball T    Hyperlipidemia    Hypertension    IBS (irritable bowel syndrome)    Migraine headache 11/06/2008   Qualifier: Diagnosis of  By: Deborra Medina MD, Talia     OVARIAN CYST, RIGHT 02/21/2009   Qualifier: Diagnosis of  By: Deborra Medina MD, Talia     PALPITATIONS, OCCASIONAL 04/17/2010   Qualifier: Diagnosis of  By: Deborra Medina MD, Talia     Pancreatitis 10/14/2017   Past Surgical History:  Procedure Laterality Date   BREAST EXCISIONAL BIOPSY Left 1997   ? left side, done at time of reduction , benign   BREAST REDUCTION SURGERY     bilateral   CESAREAN SECTION     CHOLECYSTECTOMY     COLONOSCOPY     ESOPHAGOGASTRODUODENOSCOPY     ESOPHAGOGASTRODUODENOSCOPY (EGD) WITH PROPOFOL N/A 10/21/2020   Procedure: ESOPHAGOGASTRODUODENOSCOPY (EGD) WITH PROPOFOL;  Surgeon: Lesly Rubenstein, MD;  Location: [facility] ENDOSCOPY;  Service: Endoscopy;  Laterality:  N/A;   REDUCTION MAMMAPLASTY Bilateral 1997   VAGINAL HYSTERECTOMY     Patient Active Problem List   Diagnosis Date Noted   Abnormal MRI, lumbar spine (01/29/2020) 02/19/2020   Anxiety due to MRI 01/25/2020   Claustrophobia associated to MRI 01/25/2020   Enthesopathy of knee region (Right) 01/25/2020   Enthesopathy of knee region (Left) 01/25/2020   Lumbar facet hypertrophy 01/25/2020   Lumbosacral radiculopathy at S1 (Left) 01/17/2020   Lumbosacral radiculopathy (S1) (Left) 01/17/2020   Chronic lower extremity pain (Bilateral) (L>R) 01/17/2020   History of pancreatitis 07/18/2019   Obstructive sleep apnea syndrome 01/02/2019   Lumbar facet arthropathy (Multilevel) (Bilateral) 12/14/2018   Osteoarthritis involving multiple joints 11/28/2018   Chronic hip pain (Left) 11/16/2018   Acute hip pain (Left) 11/16/2018   DDD (degenerative disc disease), lumbosacral 11/16/2018   Chronic hip pain (Secondary source of pain) (Bilateral) (L>R) 05/19/2017   Elevated C-reactive protein (CRP) 05/19/2017   Elevated sed rate 05/19/2017   Spondylosis without myelopathy or radiculopathy, lumbosacral region 05/19/2017   Pharmacologic therapy 05/06/2017   Disorder of skeletal system 05/06/2017   Problems influencing health status 05/06/2017   Chest pain with moderate risk of acute coronary syndrome 04/21/2017   DOE (  dyspnea on exertion) 04/21/2017   Intermittent palpitations 04/21/2017   Vitamin D insufficiency 05/04/2016   Depression 02/04/2016   Fatty liver 02/04/2016   Hypertension associated with diabetes ([place]) 02/04/2016   Chronic pain syndrome 01/15/2016   Acute low back pain 05/28/2015   Lumbar transverse process fracture (HCC) (Left L1) (03/11/15) 05/28/2015   Long term current use of opiate analgesic 05/01/2015   Long term prescription opiate use 05/01/2015   Opiate use (25 MME/Day) 05/01/2015   Encounter for therapeutic drug level monitoring 05/01/2015   Encounter for pain management  planning 05/01/2015   Chronic low back pain (Primary Source of Pain) (Bilateral) (L>R) 05/01/2015   Lumbar facet syndrome (Bilateral) (L>R) 05/01/2015   Chronic knee pain [facility] source of pain) (Bilateral) (L>R) 05/01/2015   Lumbar spondylosis 05/01/2015   Neuropathic pain 05/01/2015   Neurogenic pain 05/01/2015   Myofascial pain 05/01/2015   Essential hypertriglyceridemia 04/26/2014   Type 2 diabetes mellitus with hyperglycemia, with long-term current use of insulin ([place]) 04/26/2014   Anxiety 05/15/2010   Obesity 05/15/2010   Hyperlipidemia 04/03/2010   Fatty liver disease 04/02/2010   Pure hypercholesterolemia 03/19/2010   GERD 04/17/2009   Constipation 04/17/2009   TOBACCO ABUSE 11/06/2008    ONSET DATE: 02/14/2021 date of initial dx; 08/19/2021 date of referral      REFERRING DIAG: R47.01 (ICD-10-CM) - Aphasia   PERTINENT HISTORY: PMH T2DM and 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 Colin Mulders in 02/14/21 s/p radiation and TMZ.    DIAGNOSTIC FINDINGS: MRI 07/10/2021 Within limitations of motion artifact, no significant difference in left frontal based mass crossing midline through the corpus callosum. On conventional T1 postcontrast imaging, the previous areas of hyper enhancement appear to have diminished some degree. No definite areas of new involvement. Redemonstrated left frontal stereotactic biopsy tract.   THERAPY DIAG:  Cognitive communication deficit  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[place])  Rationale for Evaluation and Treatment Rehabilitation  SUBJECTIVE: "my hand is hurting"  Pt accompanied by: significant other  PAIN:  Are you having pain?  Yes, monitored with repositioning  PATIENT GOALS: to be able to speak fluently  OBJECTIVE:   TODAY'S TREATMENT: Skilled treatment session focused on alternate task to facilitate word finding abilities. SLP instructed pt in using a  simple game to facilitate conversation as she exhibited profound word finding difficulty today likely related to arm and hand pain. SLP facilitated this strategy by engaging pt in game of checkers. Pt able to demonstrate new learning and excellent selective attention to task for ~ 20 minutes. In additional, during questions/answers/conversation surrounding the game, pt's word finding deficits were minimal.    PATIENT EDUCATION: Education details: tasks to facilitate word finding Person educated: Patient and Spouse Education method: Explanation, Demonstration, and Verbal cues Education comprehension: verbalized understanding and needs further education  GOALS: Goals reviewed with patient? Yes  SHORT TERM GOALS: Target date: 10 sessions   Given basic category, pt will generate at least 6 members in 7 out of 10 opportunities with minimal cues.  Baseline: 3 members in basic category Goal status: INITIAL   2.  Pt will demonstrate use of at least 1 word finding strategy to aid in communication breakdowns across 5 sessions with minimal cues.  Baseline: introduced concept 08/20/2021 Goal status: INITIAL   3.  Pt will expressively produce 4+ word sentence in response to situation/picture with 70% accuracy given minimal cues in order to increase  ability to communicate basic wants needs. Baseline: 3 word Goal status: INITIAL     LONG TERM GOALS: Target date: 11/12/2021   Pt and caregiver will utilize word finding strategies to communicate pt's basic wants/needs in 5 out of 10 opportunities.  Baseline: introduced 08/20/2021 Goal status: INITIAL    ASSESSMENT:  CLINICAL IMPRESSION: Pt continues with fluctuating expressive communication deficits. Both pt and her husband are very responsive to strategies to improve expressive abilities.   OBJECTIVE IMPAIRMENTS include attention, expressive language, and aphasia. These impairments are limiting patient from managing medications, managing  appointments, managing finances, household responsibilities, ADLs/IADLs, and effectively communicating at home and in community. Factors affecting potential to achieve goals and functional outcome are  astrocytoma . Patient will benefit from skilled SLP services to address above impairments and improve overall function.  REHAB POTENTIAL: Fair ; astrocytomia  PLAN: SLP FREQUENCY: 1-2x/week  SLP DURATION: 12 weeks  PLANNED INTERVENTIONS: Language facilitation, Functional tasks, SLP instruction and feedback, and Patient/family education  Happi B. Rutherford Nail, M.S., CCC-SLP, Mining engineer Certified Brain Injury [place]  [place] Office [phone] Ascom [phone] Fax [phone]

## 2021-08-27 ENCOUNTER — Encounter: Payer: Self-pay | Admitting: Occupational Therapy

## 2021-08-27 ENCOUNTER — Ambulatory Visit: Payer: Medicare Other | Admitting: Occupational Therapy

## 2021-08-27 DIAGNOSIS — M6281 Muscle weakness (generalized): Secondary | ICD-10-CM

## 2021-08-27 NOTE — Therapy (Signed)
OUTPATIENT OCCUPATIONAL THERAPY TREATMENT  Patient Name: Kelly Rollins MRN: [id number] DOB:03/27/1970, 51 y.o., female Today's Date: 08/27/2021  PCP: [name], PA-C REFERRING PROVIDER: [name], Corrine Jeani Hawking, PA-C   OT End of Session - 08/27/21 1659     Visit Number 8    Number of Visits 24    Date for OT Re-Evaluation 10/27/21    OT Start Time 1300    OT Stop Time 1345    OT Time Calculation (min) 45 min    Activity Tolerance Patient tolerated treatment well    Behavior During Therapy [facility] for tasks assessed/performed             Past Medical History:  Diagnosis Date   ABDOMINAL PAIN OTHER SPECIFIED SITE 02/21/2009   Qualifier: Diagnosis of  By: Deborra Medina MD, Talia     ABDOMINAL PAIN-LUQ 04/17/2009   Qualifier: Diagnosis of  By: Fuller Plan MD Lamont Snowball T    Anal fissure    Anginal pain ([place])    Asthma    Depression    Diabetes mellitus without complication ([place])    Dyspnea    Dysrhythmia    Headache(784.0)    HEMATOCHEZIA 04/17/2009   Qualifier: Diagnosis of  By: Fuller Plan MD Lamont Snowball T    Hyperlipidemia    Hypertension    IBS (irritable bowel syndrome)    Migraine headache 11/06/2008   Qualifier: Diagnosis of  By: Deborra Medina MD, Talia     OVARIAN CYST, RIGHT 02/21/2009   Qualifier: Diagnosis of  By: Deborra Medina MD, Talia     PALPITATIONS, OCCASIONAL 04/17/2010   Qualifier: Diagnosis of  By: Deborra Medina MD, Talia     Pancreatitis 10/14/2017   Past Surgical History:  Procedure Laterality Date   BREAST EXCISIONAL BIOPSY Left 1997   ? left side, done at time of reduction , benign   BREAST REDUCTION SURGERY     bilateral   CESAREAN SECTION     CHOLECYSTECTOMY     COLONOSCOPY     ESOPHAGOGASTRODUODENOSCOPY     ESOPHAGOGASTRODUODENOSCOPY (EGD) WITH PROPOFOL N/A 10/21/2020   Procedure: ESOPHAGOGASTRODUODENOSCOPY (EGD) WITH PROPOFOL;  Surgeon: Lesly Rubenstein, MD;  Location: [facility] ENDOSCOPY;  Service: Endoscopy;  Laterality: N/A;   REDUCTION MAMMAPLASTY Bilateral  1997   VAGINAL HYSTERECTOMY     Patient Active Problem List   Diagnosis Date Noted   Abnormal MRI, lumbar spine (01/29/2020) 02/19/2020   Anxiety due to MRI 01/25/2020   Claustrophobia associated to MRI 01/25/2020   Enthesopathy of knee region (Right) 01/25/2020   Enthesopathy of knee region (Left) 01/25/2020   Lumbar facet hypertrophy 01/25/2020   Lumbosacral radiculopathy at S1 (Left) 01/17/2020   Lumbosacral radiculopathy (S1) (Left) 01/17/2020   Chronic lower extremity pain (Bilateral) (L>R) 01/17/2020   History of pancreatitis 07/18/2019   Obstructive sleep apnea syndrome 01/02/2019   Lumbar facet arthropathy (Multilevel) (Bilateral) 12/14/2018   Osteoarthritis involving multiple joints 11/28/2018   Chronic hip pain (Left) 11/16/2018   Acute hip pain (Left) 11/16/2018   DDD (degenerative disc disease), lumbosacral 11/16/2018   Chronic hip pain (Secondary source of pain) (Bilateral) (L>R) 05/19/2017   Elevated C-reactive protein (CRP) 05/19/2017   Elevated sed rate 05/19/2017   Spondylosis without myelopathy or radiculopathy, lumbosacral region 05/19/2017   Pharmacologic therapy 05/06/2017   Disorder of skeletal system 05/06/2017   Problems influencing health status 05/06/2017   Chest pain with moderate risk of acute coronary syndrome 04/21/2017   DOE (dyspnea on exertion) 04/21/2017   Intermittent palpitations 04/21/2017  Vitamin D insufficiency 05/04/2016   Depression 02/04/2016   Fatty liver 02/04/2016   Hypertension associated with diabetes ([place]) 02/04/2016   Chronic pain syndrome 01/15/2016   Acute low back pain 05/28/2015   Lumbar transverse process fracture (HCC) (Left L1) (03/11/15) 05/28/2015   Long term current use of opiate analgesic 05/01/2015   Long term prescription opiate use 05/01/2015   Opiate use (25 MME/Day) 05/01/2015   Encounter for therapeutic drug level monitoring 05/01/2015   Encounter for pain management planning 05/01/2015   Chronic low back  pain (Primary Source of Pain) (Bilateral) (L>R) 05/01/2015   Lumbar facet syndrome (Bilateral) (L>R) 05/01/2015   Chronic knee pain [facility] source of pain) (Bilateral) (L>R) 05/01/2015   Lumbar spondylosis 05/01/2015   Neuropathic pain 05/01/2015   Neurogenic pain 05/01/2015   Myofascial pain 05/01/2015   Essential hypertriglyceridemia 04/26/2014   Type 2 diabetes mellitus with hyperglycemia, with long-term current use of insulin ([place]) 04/26/2014   Anxiety 05/15/2010   Obesity 05/15/2010   Hyperlipidemia 04/03/2010   Fatty liver disease 04/02/2010   Pure hypercholesterolemia 03/19/2010   GERD 04/17/2009   Constipation 04/17/2009   TOBACCO ABUSE 11/06/2008    ONSET DATE: 02/06/2021  REFERRING DIAG: Astrocytoma  THERAPY DIAG:  Muscle weakness (generalized)  Rationale for Evaluation and Treatment Rehabilitation  SUBJECTIVE: Pt. was present with her husband for therapy today.  SUBJECTIVE STATEMENT: Pt. Reports that it is her daughter's birthday today. Pt accompanied by: significant other  PERTINENT HISTORY:  Pt. Is a 51 y.o. 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 PMHx includes: Anorexia, dehydration, Adult Failure to thrive, contracture of muscles multiple sites. Pt./caregiver reports recently being hospitalized with a UTI, and dehydration.   PRECAUTIONS: Fall  WEIGHT BEARING RESTRICTIONS No  PAIN:  Are you having pain?  Pt. Reports 6/10 pain in the right UE, and digits. Pt reports 2/10 pain, however pt. intermittent facial, and verbal responses reflect increased pain levels.  FALLS: Has patient fallen in last 6 months? Yes. Number of falls Multiple Falls  LIVING ENVIRONMENT: Lives with: Family Lives in: House/apartment Stairs: Yes: External: 4 steps; bilateral but cannot reach both Has following equipment at home: Single point cane, Walker - 4 wheeled, Hemi walker, shower  chair, bed side commode, and Grab bars Lift chair in the tub, [place],  PLOF: Independent  PATIENT GOALS: To improve the right UE  OBJECTIVE:   HAND DOMINANCE: Right  ADLs: Transfers/ambulation related to ADLs: Eating: MaxA with cutting food, setting up, Modified I with nondominant Left hand, [place]  Grooming: Independent  UB Dressing: MaxA  LB Dressing: MaxA Toileting: Uses a BSCommode mInA transfers, MaxA clothing negotiation Bathing: MaxA Tub Shower transfers:  Uses a shower lift chair Equipment:  w/c, BSCommode, cane   IADLs: Shopping: Husband performs Light housekeeping: Max Meal Prep: MaxA Community mobility: Relies on family Medication management:Husband assists Doctor, hospital: Performs together Handwriting: Not legible  MOBILITY STATUS: Hx of falls    ACTIVITY TOLERANCE: Activity tolerance: limited  FUNCTIONAL OUTCOME MEASURES: FOTO: 11.76  UPPER EXTREMITY ROM  : WFL Left AROM/PROM   Passive ROM Right eval Right  08/18/21  Shoulder flexion 30   Shoulder abduction 40 62  Shoulder adduction    Shoulder extension    Shoulder internal rotation    Shoulder external rotation    Elbow flexion 120   Elbow extension -67   Wrist flexion Neutral   Wrist extension 12  Wrist ulnar deviation    Wrist radial deviation    Wrist pronation    Wrist supination    (Blank rows = not tested)   UPPER EXTREMITY MMT:     MMT Right eval Left eval  Shoulder flexion 0/5 4+/5  Shoulder abduction 0/5 4+/5  Shoulder adduction    Shoulder extension    Shoulder internal rotation    Shoulder external rotation    Middle trapezius    Lower trapezius    Elbow flexion 0/5 4+/5  Elbow extension 0/5 4+/5  Wrist flexion    Wrist extension 0/5 4+/5  Wrist ulnar deviation    Wrist radial deviation    Wrist pronation    Wrist supination    (Blank rows = not tested)    SENSATION: Light touch: WFL  EDEMA:   Wrist: 18.5 cm MCP: 21.5 cm    MUSCLE TONE:  RUE: Severe  COGNITION: Overall cognitive status: Impaired  VISION: Subjective report: Wears glasses Baseline vision: Wears glasses all the time Pt. Reports no changes    PRAXIS: Impaired: Motor planning     TODAY'S TREATMENT:   Pt. tolerated retrograde massage to the right hand, and digits secondary to edema. Pt. tolerated soft tissue massage to the muscles in the right scapular region. Manual therapy was perormed prior to, and in preparation for ROM, and therapeutic Ex. Pt. tolerated PROM for right wrist flexion and extension, digit flexion and extension, and thumb abduction. Pt./caregiver education was provided about  the  right hand palm protector, edema control techniques, ROM, positioning in right shoulder. Pt. Continues to tolerate the palm protector with 1" roll without skin irritation, or breakdown in the right palm.   Pt. reports that the palm protector continues to help, however has had several episodes at home that her hand cramped into tight flexion, and was not able to fit the palm protector with the 1" roll in place at that time. Pt./caregiver education was provided about removing the 1" roll from the palm protector, and using the palm protector without it when experiencing tight flexor synergy pattern. Pt./caregiver reports that at times the pt. is not able tolerate ROM at home. Pt./caregiver education was provided about resting hand splints.  Pt.'s right shoulder flexion, and abduction, and digit extension ROM continues to be  limited by pain, however pt. was able to tolerate increased shoulder abduction, and digit extension today. Pt reports 6/10 pain.  Pt. was able to tolerate slightly more abduction today following moist heat pack modality to the right shoulder, and soft tissue massage to the scapular region. Pt. continues to initially maintain the digits on her right hand in tight flexor synergy pattern. Pt. was able to tolerate retrograde massage for edema control, and ROM  to the right hand, and digits. Pt. continues to work on improving pain, and improving RUE ROM in order to improve engagement with ADLs/IADLs, and promote good skin integrity through the palmar surface of the hand   PATIENT EDUCATION: Education details: Right UE ROM, positioning of the RUE Person educated: Patient and Spouse Education method: Explanation, Demonstration, Tactile cues, and Verbal cues Education comprehension: verbalized understanding, returned demonstration, and needs further education   HOME EXERCISE PROGRAM:   Pt./caregiver education about positioning of the RUE, ROM    GOALS: Goals reviewed with patient? Yes  SHORT TERM GOALS: Target date: 09/15/2021    Pt./caregivers with demonstrate independence with HEPs for UE ROM, and edema control Baseline: No current HEPs Goal status: INITIAL  LONG TERM GOALS: Target date: 10/27/2021    Pt. Will improve right hand edema by 1 cm at the wrist, and MCPs Baseline: Eval: Wrist: 18.5cm, MCPs: 21.5cm Goal status: INITIAL  2.  Pt. Will improve right shoulder PROM by 10 degrees with ADLs Baseline: Eval: PROM shoulder flexion: 30 degrees, Abduction: 40 degrees Goal status: INITIAL  3.  Pt. Will improve right elbow PROM by 10 degrees to prepare for hand to mouth patterns Baseline: Eval:   elbow extension -67, flexion 120 Goal status: INITIAL  4.  Pt. Will improve PROM right wrist flexion, and extension by 10 degrees Baseline: Eval: wrist extension 12 degrees, flexion to neutral Goal status: INITIAL  5.  Pt. Will tolerate full digit extension in the right hand to promote good skin integrity through the palmar surface of the right hand  Baseline: Pt. Maintains digits in tight flexion Goal status: INITIAL  6.  Pt. Will require minA UE dressing using one armed dressing techniques. Baseline: MaxA Goal status: INITIAL  ASSESSMENT:  CLINICAL IMPRESSION:  Pt. reports that the palm protector continues to help, however  has had a episode at home that her hand cramped into tight flexion, and was not able to fit the palm protector with the 1" roll in place at that time. Pt./caregiver education was provided about removing the 1" roll from the palm protector, and using the palm protector without it when experiencing tight flexor synergy pattern.  Pt./care giver reports that at times the pt. Is not able tolerate ROM at home.  Pt.'s right shoulder flexion, and abduction, and digit extension ROM continues to be  limited by pain, however pt. was able to tolerate increased shoulder abduction, and digit extension today. Pt reports 2/10 pain, however pt. Intermittent facial, and verbal responses reflect increased pain levels. Pt. was able to tolerate slightly more abduction today following moist heat pack modality to the right shoulder.  Pt. continues to initially maintain the digits on her right hand in tight flexor synergy pattern. Pt. was able to tolerate retrograde massage for edema control, and ROM to the right hand, and digits. Pt. continues to work on improving pain, and improving RUE ROM in order to improve engagement with ADLs/IADLs, and promote good skin integrity through the palmar surface of the hand.  PERFORMANCE DEFICITS in functional skills including ADLs, IADLs, coordination, proprioception, sensation, edema, tone, ROM, strength, pain, FMC, GMC, balance, decreased knowledge of use of DME, skin integrity, and UE functional use, cognitive skills including attention, memory, and safety awareness, and psychosocial skills including coping strategies.   IMPAIRMENTS are limiting patient from ADLs, IADLs, education, work, leisure, and social participation.   COMORBIDITIES may have co-morbidities  that affects occupational performance. Patient will benefit from skilled OT to address above impairments and improve overall function.  MODIFICATION OR ASSISTANCE TO COMPLETE EVALUATION: Maximum modification of tasks or assist with  assess necessary to complete an evaluation.  OT OCCUPATIONAL PROFILE AND HISTORY: Detailed assessment: Review of records and additional review of physical, cognitive, psychosocial history related to current functional performance.  CLINICAL DECISION MAKING: High - multiple treatment options, significant modification of task necessary  REHAB POTENTIAL: Good for stated goals  EVALUATION COMPLEXITY: High    PLAN: OT FREQUENCY: 2x/week  OT DURATION: 12 weeks  PLANNED INTERVENTIONS: self care/ADL training, therapeutic exercise, therapeutic activity, neuromuscular re-education, manual therapy, passive range of motion, paraffin, moist heat, contrast bath, patient/family education, cognitive remediation/compensation, energy conservation, and coping strategies training  RECOMMENDED OTHER SERVICES: PT, and ST  CONSULTED AND AGREED WITH PLAN OF CARE: Patient and family member/caregiver  PLAN FOR NEXT SESSION: Initiate PROM, and RUE Edema control techniques   Harrel Carina, MS, OTR/L  Harrel Carina, OT 08/27/2021, 5:08 PM

## 2021-09-01 ENCOUNTER — Ambulatory Visit: Payer: Medicare Other | Admitting: Speech Pathology

## 2021-09-01 ENCOUNTER — Ambulatory Visit: Payer: Medicare Other | Admitting: Occupational Therapy

## 2021-09-01 ENCOUNTER — Encounter: Payer: Self-pay | Admitting: Occupational Therapy

## 2021-09-01 DIAGNOSIS — M6281 Muscle weakness (generalized): Secondary | ICD-10-CM

## 2021-09-01 DIAGNOSIS — C719 Malignant neoplasm of brain, unspecified: Secondary | ICD-10-CM

## 2021-09-01 DIAGNOSIS — R4701 Aphasia: Secondary | ICD-10-CM

## 2021-09-01 NOTE — Therapy (Addendum)
OUTPATIENT OCCUPATIONAL THERAPY TREATMENT  Patient Name: Kelly Rollins MRN: [id number] DOB:[birth year], 51 y.o., female Today's Date: 09/01/2021  PCP: [name], PA-C REFERRING PROVIDER: [name], Corrine Jeani Hawking, PA-C   OT End of Session - 09/01/21 1632     Visit Number 9    Number of Visits 24    Date for OT Re-Evaluation 10/27/21    Authorization Time Period Progress reports period starting 08/04/2021    OT Start Time 1300    OT Stop Time 1345    OT Time Calculation (min) 45 min    Activity Tolerance Patient tolerated treatment well    Behavior During Therapy [facility] for tasks assessed/performed             Past Medical History:  Diagnosis Date   ABDOMINAL PAIN OTHER SPECIFIED SITE 02/21/2009   Qualifier: Diagnosis of  By: Deborra Medina MD, Talia     ABDOMINAL PAIN-LUQ 04/17/2009   Qualifier: Diagnosis of  By: Fuller Plan MD Lamont Snowball T    Anal fissure    Anginal pain ([place])    Asthma    Depression    Diabetes mellitus without complication ([place])    Dyspnea    Dysrhythmia    Headache(784.0)    HEMATOCHEZIA 04/17/2009   Qualifier: Diagnosis of  By: Fuller Plan MD Lamont Snowball T    Hyperlipidemia    Hypertension    IBS (irritable bowel syndrome)    Migraine headache 11/06/2008   Qualifier: Diagnosis of  By: Deborra Medina MD, Talia     OVARIAN CYST, RIGHT 02/21/2009   Qualifier: Diagnosis of  By: Deborra Medina MD, Talia     PALPITATIONS, OCCASIONAL 04/17/2010   Qualifier: Diagnosis of  By: Deborra Medina MD, Talia     Pancreatitis 10/14/2017   Past Surgical History:  Procedure Laterality Date   BREAST EXCISIONAL BIOPSY Left 1997   ? left side, done at time of reduction , benign   BREAST REDUCTION SURGERY     bilateral   CESAREAN SECTION     CHOLECYSTECTOMY     COLONOSCOPY     ESOPHAGOGASTRODUODENOSCOPY     ESOPHAGOGASTRODUODENOSCOPY (EGD) WITH PROPOFOL N/A 10/21/2020   Procedure: ESOPHAGOGASTRODUODENOSCOPY (EGD) WITH PROPOFOL;  Surgeon: Lesly Rubenstein, MD;  Location: [facility] ENDOSCOPY;   Service: Endoscopy;  Laterality: N/A;   REDUCTION MAMMAPLASTY Bilateral 1997   VAGINAL HYSTERECTOMY     Patient Active Problem List   Diagnosis Date Noted   Abnormal MRI, lumbar spine (01/29/2020) 02/19/2020   Anxiety due to MRI 01/25/2020   Claustrophobia associated to MRI 01/25/2020   Enthesopathy of knee region (Right) 01/25/2020   Enthesopathy of knee region (Left) 01/25/2020   Lumbar facet hypertrophy 01/25/2020   Lumbosacral radiculopathy at S1 (Left) 01/17/2020   Lumbosacral radiculopathy (S1) (Left) 01/17/2020   Chronic lower extremity pain (Bilateral) (L>R) 01/17/2020   History of pancreatitis 07/18/2019   Obstructive sleep apnea syndrome 01/02/2019   Lumbar facet arthropathy (Multilevel) (Bilateral) 12/14/2018   Osteoarthritis involving multiple joints 11/28/2018   Chronic hip pain (Left) 11/16/2018   Acute hip pain (Left) 11/16/2018   DDD (degenerative disc disease), lumbosacral 11/16/2018   Chronic hip pain (Secondary source of pain) (Bilateral) (L>R) 05/19/2017   Elevated C-reactive protein (CRP) 05/19/2017   Elevated sed rate 05/19/2017   Spondylosis without myelopathy or radiculopathy, lumbosacral region 05/19/2017   Pharmacologic therapy 05/06/2017   Disorder of skeletal system 05/06/2017   Problems influencing health status 05/06/2017   Chest pain with moderate risk of acute coronary syndrome 04/21/2017   DOE (  dyspnea on exertion) 04/21/2017   Intermittent palpitations 04/21/2017   Vitamin D insufficiency 05/04/2016   Depression 02/04/2016   Fatty liver 02/04/2016   Hypertension associated with diabetes ([place]) 02/04/2016   Chronic pain syndrome 01/15/2016   Acute low back pain 05/28/2015   Lumbar transverse process fracture (HCC) (Left L1) (03/11/15) 05/28/2015   Long term current use of opiate analgesic 05/01/2015   Long term prescription opiate use 05/01/2015   Opiate use (25 MME/Day) 05/01/2015   Encounter for therapeutic drug level monitoring 05/01/2015    Encounter for pain management planning 05/01/2015   Chronic low back pain (Primary Source of Pain) (Bilateral) (L>R) 05/01/2015   Lumbar facet syndrome (Bilateral) (L>R) 05/01/2015   Chronic knee pain [facility] source of pain) (Bilateral) (L>R) 05/01/2015   Lumbar spondylosis 05/01/2015   Neuropathic pain 05/01/2015   Neurogenic pain 05/01/2015   Myofascial pain 05/01/2015   Essential hypertriglyceridemia 04/26/2014   Type 2 diabetes mellitus with hyperglycemia, with long-term current use of insulin ([place]) 04/26/2014   Anxiety 05/15/2010   Obesity 05/15/2010   Hyperlipidemia 04/03/2010   Fatty liver disease 04/02/2010   Pure hypercholesterolemia 03/19/2010   GERD 04/17/2009   Constipation 04/17/2009   TOBACCO ABUSE 11/06/2008    ONSET DATE: 02/06/2021  REFERRING DIAG: Astrocytoma  THERAPY DIAG:  Muscle weakness (generalized)  Rationale for Evaluation and Treatment Rehabilitation  SUBJECTIVE:  Pt. Reports they are in the process of getting her medications clarified with the pharmacy, and MD office.   SUBJECTIVE STATEMENT: Pt. Reports that it is her daughter's birthday today. Pt accompanied by: significant other  PERTINENT HISTORY:  Pt. Is a 51 y.o. 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 PMHx includes: Anorexia, dehydration, Adult Failure to thrive, contracture of muscles multiple sites. Pt./caregiver reports recently being hospitalized with a UTI, and dehydration.   PRECAUTIONS: Fall  WEIGHT BEARING RESTRICTIONS No  PAIN:  Are you having pain?  Pt. Reports 3/10 pain in the right UE, and digits. Pt. intermittent facial, and verbal responses reflect increased pain levels.  FALLS: Has patient fallen in last 6 months? Yes. Number of falls Multiple Falls  LIVING ENVIRONMENT: Lives with: Family Lives in: House/apartment Stairs: Yes: External: 4 steps; bilateral but cannot reach  both Has following equipment at home: Single point cane, Walker - 4 wheeled, Hemi walker, shower chair, bed side commode, and Grab bars Lift chair in the tub, [place],  PLOF: Independent  PATIENT GOALS: To improve the right UE  OBJECTIVE:   HAND DOMINANCE: Right  ADLs: Transfers/ambulation related to ADLs: Eating: MaxA with cutting food, setting up, Modified I with nondominant Left hand, [place]  Grooming: Independent  UB Dressing: MaxA  LB Dressing: MaxA Toileting: Uses a BSCommode mInA transfers, MaxA clothing negotiation Bathing: MaxA Tub Shower transfers:  Uses a shower lift chair Equipment:  w/c, BSCommode, cane   IADLs: Shopping: Husband performs Light housekeeping: Max Meal Prep: MaxA Community mobility: Relies on family Medication management:Husband assists Doctor, hospital: Performs together Handwriting: Not legible  MOBILITY STATUS: Hx of falls    ACTIVITY TOLERANCE: Activity tolerance: limited  FUNCTIONAL OUTCOME MEASURES: FOTO: 11.76  UPPER EXTREMITY ROM  : WFL Left AROM/PROM   Passive ROM Right eval Right  08/18/21  Shoulder flexion 30   Shoulder abduction 40 62  Shoulder adduction    Shoulder extension    Shoulder internal rotation    Shoulder external rotation    Elbow flexion  120   Elbow extension -67   Wrist flexion Neutral   Wrist extension 12   Wrist ulnar deviation    Wrist radial deviation    Wrist pronation    Wrist supination    (Blank rows = not tested)   UPPER EXTREMITY MMT:     MMT Right eval Left eval  Shoulder flexion 0/5 4+/5  Shoulder abduction 0/5 4+/5  Shoulder adduction    Shoulder extension    Shoulder internal rotation    Shoulder external rotation    Middle trapezius    Lower trapezius    Elbow flexion 0/5 4+/5  Elbow extension 0/5 4+/5  Wrist flexion    Wrist extension 0/5 4+/5  Wrist ulnar deviation    Wrist radial deviation    Wrist pronation    Wrist supination    (Blank rows = not  tested)    SENSATION: Light touch: WFL  EDEMA:   Wrist: 18.5 cm MCP: 21.5 cm    MUSCLE TONE: RUE: Severe  COGNITION: Overall cognitive status: Impaired  VISION: Subjective report: Wears glasses Baseline vision: Wears glasses all the time Pt. Reports no changes    PRAXIS: Impaired: Motor planning     TODAY'S TREATMENT:   Pt. tolerated retrograde massage to the right hand, and digits secondary to edema. Pt. tolerated soft tissue massage to the muscles in the right scapular region. Manual therapy was perormed prior to, and in preparation for ROM, and therapeutic Ex. Pt. tolerated PROM for right wrist flexion and extension, digit flexion and extension, and thumb abduction. Pt./caregiver education was provided about  the  right hand palm protector, edema control techniques, ROM, positioning in right shoulder. Pt. Continues to tolerate the palm protector with 1" roll without skin irritation, or breakdown in the right palm. Pt. Does have an area of redness, and irritation on the lateral aspect of the 2nd digit from the thumb nail when she is not wearing the palm protector.   Pt. presents with a red area on the lateral aspect of her right 2nd digit from the thumb nail when pt. is not wearing the palm protector secondary to tight flexor synergy. Pt.'s right shoulder flexion, and abduction, and digit extension ROM continues to be  limited by pain, however pt. was able to tolerate increased shoulder abduction, and digit extension today. Pt reports 3/10 pain.  Pt. continues to be able to tolerate slightly more abduction today following moist heat pack modality to the right shoulder, and soft tissue massage to the scapular region. Pt. presents with increased flexor tightness in the right 2nd, and 3rd digits.  Pt. continues to initially maintain the digits on her right hand in tight flexor synergy pattern. Pt. was able to tolerate retrograde massage for edema control, and ROM to the right hand,  and digits. Pt. continues to work on improving pain, and improving RUE ROM in order to improve engagement with ADLs/IADLs, and promote good skin integrity through the palmar surface of the hand.   PATIENT EDUCATION: Education details: Right UE ROM, positioning of the RUE Person educated: Patient and Spouse Education method: Explanation, Demonstration, Tactile cues, and Verbal cues Education comprehension: verbalized understanding, returned demonstration, and needs further education   HOME EXERCISE PROGRAM:   Pt./caregiver education about positioning of the RUE, ROM    GOALS: Goals reviewed with patient? Yes  SHORT TERM GOALS: Target date: 09/15/2021    Pt./caregivers with demonstrate independence with HEPs for UE ROM, and edema control Baseline: No current HEPs Goal  status: INITIAL    LONG TERM GOALS: Target date: 10/27/2021    Pt. Will improve right hand edema by 1 cm at the wrist, and MCPs Baseline: Eval: Wrist: 18.5cm, MCPs: 21.5cm Goal status: INITIAL  2.  Pt. Will improve right shoulder PROM by 10 degrees with ADLs Baseline: Eval: PROM shoulder flexion: 30 degrees, Abduction: 40 degrees Goal status: INITIAL  3.  Pt. Will improve right elbow PROM by 10 degrees to prepare for hand to mouth patterns Baseline: Eval:   elbow extension -67, flexion 120 Goal status: INITIAL  4.  Pt. Will improve PROM right wrist flexion, and extension by 10 degrees Baseline: Eval: wrist extension 12 degrees, flexion to neutral Goal status: INITIAL  5.  Pt. Will tolerate full digit extension in the right hand to promote good skin integrity through the palmar surface of the right hand  Baseline: Pt. Maintains digits in tight flexion Goal status: INITIAL  6.  Pt. Will require minA UE dressing using one armed dressing techniques. Baseline: MaxA Goal status: INITIAL  ASSESSMENT:  CLINICAL IMPRESSION:  Pt. presents with a red area on the lateral aspect of her right 2nd digit from the  thumb nail when pt. is not wearing the palm protector secondary to tight flexor synergy. Pt.'s right shoulder flexion, and abduction, and digit extension ROM continues to be  limited by pain, however pt. was able to tolerate increased shoulder abduction, and digit extension today. Pt reports 3/10 pain.  Pt. continues to be able to tolerate slightly more abduction today following moist heat pack modality to the right shoulder, and soft tissue massage to the scapular region. Pt. presents with increased flexor tightness in the right 2nd, and 3rd digits.  Pt. continues to initially maintain the digits on her right hand in tight flexor synergy pattern. Pt. was able to tolerate retrograde massage for edema control, and ROM to the right hand, and digits. Pt. continues to work on improving pain, and improving RUE ROM in order to improve engagement with ADLs/IADLs, and promote good skin integrity through the palmar surface of the hand.  PERFORMANCE DEFICITS in functional skills including ADLs, IADLs, coordination, proprioception, sensation, edema, tone, ROM, strength, pain, FMC, GMC, balance, decreased knowledge of use of DME, skin integrity, and UE functional use, cognitive skills including attention, memory, and safety awareness, and psychosocial skills including coping strategies.   IMPAIRMENTS are limiting patient from ADLs, IADLs, education, work, leisure, and social participation.   COMORBIDITIES may have co-morbidities  that affects occupational performance. Patient will benefit from skilled OT to address above impairments and improve overall function.  MODIFICATION OR ASSISTANCE TO COMPLETE EVALUATION: Maximum modification of tasks or assist with assess necessary to complete an evaluation.  OT OCCUPATIONAL PROFILE AND HISTORY: Detailed assessment: Review of records and additional review of physical, cognitive, psychosocial history related to current functional performance.  CLINICAL DECISION MAKING: High  - multiple treatment options, significant modification of task necessary  REHAB POTENTIAL: Good for stated goals  EVALUATION COMPLEXITY: High    PLAN: OT FREQUENCY: 2x/week  OT DURATION: 12 weeks  PLANNED INTERVENTIONS: self care/ADL training, therapeutic exercise, therapeutic activity, neuromuscular re-education, manual therapy, passive range of motion, paraffin, moist heat, contrast bath, patient/family education, cognitive remediation/compensation, energy conservation, and coping strategies training  RECOMMENDED OTHER SERVICES: PT, and ST  CONSULTED AND AGREED WITH PLAN OF CARE: Patient and family member/caregiver  PLAN FOR NEXT SESSION: Initiate PROM, and RUE Edema control techniques   Harrel Carina, MS, OTR/L  Harrel Carina, OT  09/01/2021, 4:35 PM

## 2021-09-01 NOTE — Therapy (Signed)
OUTPATIENT SPEECH LANGUAGE PATHOLOGY TREATMENT NOTE   Patient Name: Kelly Rollins MRN: [id number] DOB:November 05, 1970, 51 y.o., female Today's Date: 09/01/2021  PCP: Grayland Ormond, PA REFERRING PROVIDER: Margarita Grizzle, NP  END OF SESSION:   End of Session - 09/01/21 1413     Visit Number 3    Number of Visits 25    Date for SLP Re-Evaluation 11/13/21    Authorization Type [employer]    Progress Note Due on Visit 10    SLP Start Time 1345    SLP Stop Time  1445    SLP Time Calculation (min) 60 min    Activity Tolerance Patient tolerated treatment well             Past Medical History:  Diagnosis Date   ABDOMINAL PAIN OTHER SPECIFIED SITE 02/21/2009   Qualifier: Diagnosis of  By: Deborra Medina MD, Talia     ABDOMINAL PAIN-LUQ 04/17/2009   Qualifier: Diagnosis of  By: Fuller Plan MD Lamont Snowball T    Anal fissure    Anginal pain ([place])    Asthma    Depression    Diabetes mellitus without complication ([place])    Dyspnea    Dysrhythmia    Headache(784.0)    HEMATOCHEZIA 04/17/2009   Qualifier: Diagnosis of  By: Fuller Plan MD Lamont Snowball T    Hyperlipidemia    Hypertension    IBS (irritable bowel syndrome)    Migraine headache 11/06/2008   Qualifier: Diagnosis of  By: Deborra Medina MD, Talia     OVARIAN CYST, RIGHT 02/21/2009   Qualifier: Diagnosis of  By: Deborra Medina MD, Talia     PALPITATIONS, OCCASIONAL 04/17/2010   Qualifier: Diagnosis of  By: Deborra Medina MD, Talia     Pancreatitis 10/14/2017   Past Surgical History:  Procedure Laterality Date   BREAST EXCISIONAL BIOPSY Left 1997   ? left side, done at time of reduction , benign   BREAST REDUCTION SURGERY     bilateral   CESAREAN SECTION     CHOLECYSTECTOMY     COLONOSCOPY     ESOPHAGOGASTRODUODENOSCOPY     ESOPHAGOGASTRODUODENOSCOPY (EGD) WITH PROPOFOL N/A 10/21/2020   Procedure: ESOPHAGOGASTRODUODENOSCOPY (EGD) WITH PROPOFOL;  Surgeon: Lesly Rubenstein, MD;  Location: [facility] ENDOSCOPY;  Service: Endoscopy;  Laterality:  N/A;   REDUCTION MAMMAPLASTY Bilateral 1997   VAGINAL HYSTERECTOMY     Patient Active Problem List   Diagnosis Date Noted   Abnormal MRI, lumbar spine (01/29/2020) 02/19/2020   Anxiety due to MRI 01/25/2020   Claustrophobia associated to MRI 01/25/2020   Enthesopathy of knee region (Right) 01/25/2020   Enthesopathy of knee region (Left) 01/25/2020   Lumbar facet hypertrophy 01/25/2020   Lumbosacral radiculopathy at S1 (Left) 01/17/2020   Lumbosacral radiculopathy (S1) (Left) 01/17/2020   Chronic lower extremity pain (Bilateral) (L>R) 01/17/2020   History of pancreatitis 07/18/2019   Obstructive sleep apnea syndrome 01/02/2019   Lumbar facet arthropathy (Multilevel) (Bilateral) 12/14/2018   Osteoarthritis involving multiple joints 11/28/2018   Chronic hip pain (Left) 11/16/2018   Acute hip pain (Left) 11/16/2018   DDD (degenerative disc disease), lumbosacral 11/16/2018   Chronic hip pain (Secondary source of pain) (Bilateral) (L>R) 05/19/2017   Elevated C-reactive protein (CRP) 05/19/2017   Elevated sed rate 05/19/2017   Spondylosis without myelopathy or radiculopathy, lumbosacral region 05/19/2017   Pharmacologic therapy 05/06/2017   Disorder of skeletal system 05/06/2017   Problems influencing health status 05/06/2017   Chest pain with moderate risk of acute coronary syndrome 04/21/2017   DOE (  dyspnea on exertion) 04/21/2017   Intermittent palpitations 04/21/2017   Vitamin D insufficiency 05/04/2016   Depression 02/04/2016   Fatty liver 02/04/2016   Hypertension associated with diabetes (HCC) 02/04/2016   Chronic pain syndrome 01/15/2016   Acute low back pain 05/28/2015   Lumbar transverse process fracture (HCC) (Left L1) (03/11/15) 05/28/2015   Long term current use of opiate analgesic 05/01/2015   Long term prescription opiate use 05/01/2015   Opiate use (25 MME/Day) 05/01/2015   Encounter for therapeutic drug level monitoring 05/01/2015   Encounter for pain management  planning 05/01/2015   Chronic low back pain (Primary Source of Pain) (Bilateral) (L>R) 05/01/2015   Lumbar facet syndrome (Bilateral) (L>R) 05/01/2015   Chronic knee pain (Tertiary source of pain) (Bilateral) (L>R) 05/01/2015   Lumbar spondylosis 05/01/2015   Neuropathic pain 05/01/2015   Neurogenic pain 05/01/2015   Myofascial pain 05/01/2015   Essential hypertriglyceridemia 04/26/2014   Type 2 diabetes mellitus with hyperglycemia, with long-term current use of insulin (HCC) 04/26/2014   Anxiety 05/15/2010   Obesity 05/15/2010   Hyperlipidemia 04/03/2010   Fatty liver disease 04/02/2010   Pure hypercholesterolemia 03/19/2010   GERD 04/17/2009   Constipation 04/17/2009   TOBACCO ABUSE 11/06/2008    ONSET DATE: 02/14/2021 date of initial dx; 08/19/2021 date of referral      REFERRING DIAG: R47.01 (ICD-10-CM) - Aphasia   PERTINENT HISTORY: PMH T2DM and 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 Komisarow in 02/14/21 s/p radiation and TMZ.    DIAGNOSTIC FINDINGS: MRI 07/10/2021 Within limitations of motion artifact, no significant difference in left frontal based mass crossing midline through the corpus callosum. On conventional T1 postcontrast imaging, the previous areas of hyper enhancement appear to have diminished some degree. No definite areas of new involvement. Redemonstrated left frontal stereotactic biopsy tract.   THERAPY DIAG:  Aphasia  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HCC)  Rationale for Evaluation and Treatment Rehabilitation  SUBJECTIVE: pt appeared more tired than usual, frustrated by word finding difficulty  Pt accompanied by: significant other  PAIN:  Are you having pain?  Yes, monitored with repositioning  PATIENT GOALS: to be able to speak fluently  OBJECTIVE:   TODAY'S TREATMENT: Skilled treatment session focused on pt's word finding deficits. SLP introduced and  demonstrated Speech Generating Device. Pt and her husband interested in pursuing. Along this line, SLP further facilitated session by engaging pt in an interest checklist for creation of communicative utterances.    PATIENT EDUCATION: Education details: tasks to facilitate word finding Person educated: Patient and Spouse Education method: Explanation, Demonstration, and Verbal cues Education comprehension: verbalized understanding and needs further education  GOALS: Goals reviewed with patient? Yes  SHORT TERM GOALS: Target date: 10 sessions   Given basic category, pt will generate at least 6 members in 7 out of 10 opportunities with minimal cues.  Baseline: 3 members in basic category Goal status: INITIAL   2.  Pt will demonstrate use of at least 1 word finding strategy to aid in communication breakdowns across 5 sessions with minimal cues.  Baseline: introduced concept 08/20/2021 Goal status: INITIAL   3.  Pt will expressively produce 4+ word sentence in response to situation/picture with 70% accuracy given minimal cues in order to increase ability to communicate basic wants needs. Baseline: 3 word Goal status: INITIAL     LONG TERM GOALS: Target date: 11/12/2021   Pt and caregiver will utilize word finding   strategies to communicate pt's basic wants/needs in 5 out of 10 opportunities.  Baseline: introduced 08/20/2021 Goal status: INITIAL    ASSESSMENT:  CLINICAL IMPRESSION: Pt continues with fluctuating expressive communication deficits. Will request trial SGD from [facility] to assess if pt is a good candidate for use.   OBJECTIVE IMPAIRMENTS include attention, expressive language, and aphasia. These impairments are limiting patient from managing medications, managing appointments, managing finances, household responsibilities, ADLs/IADLs, and effectively communicating at home and in community. Factors affecting potential to achieve goals and functional outcome are   astrocytoma . Patient will benefit from skilled SLP services to address above impairments and improve overall function.  REHAB POTENTIAL: Fair ; astrocytomia  PLAN: SLP FREQUENCY: 1-2x/week  SLP DURATION: 12 weeks  PLANNED INTERVENTIONS: Language facilitation, Functional tasks, SLP instruction and feedback, and Patient/family education  Loralee Weitzman B. Rutherford Nail, M.S., CCC-SLP, Mining engineer Certified Brain Injury [place]  [place] Office [phone] Ascom [phone] Fax [phone]

## 2021-09-03 ENCOUNTER — Ambulatory Visit: Payer: Medicare Other | Admitting: Occupational Therapy

## 2021-09-03 ENCOUNTER — Ambulatory Visit: Payer: Medicare Other | Admitting: Speech Pathology

## 2021-09-08 ENCOUNTER — Ambulatory Visit: Payer: Medicare Other | Admitting: Speech Pathology

## 2021-09-08 ENCOUNTER — Encounter: Payer: Self-pay | Admitting: Occupational Therapy

## 2021-09-08 ENCOUNTER — Ambulatory Visit: Payer: Medicare Other | Attending: Medical Oncology | Admitting: Occupational Therapy

## 2021-09-08 DIAGNOSIS — R41841 Cognitive communication deficit: Secondary | ICD-10-CM | POA: Insufficient documentation

## 2021-09-08 DIAGNOSIS — R4701 Aphasia: Secondary | ICD-10-CM | POA: Diagnosis present

## 2021-09-08 DIAGNOSIS — C719 Malignant neoplasm of brain, unspecified: Secondary | ICD-10-CM

## 2021-09-08 DIAGNOSIS — R2681 Unsteadiness on feet: Secondary | ICD-10-CM | POA: Insufficient documentation

## 2021-09-08 DIAGNOSIS — R278 Other lack of coordination: Secondary | ICD-10-CM | POA: Diagnosis present

## 2021-09-08 DIAGNOSIS — M6281 Muscle weakness (generalized): Secondary | ICD-10-CM | POA: Insufficient documentation

## 2021-09-08 DIAGNOSIS — R262 Difficulty in walking, not elsewhere classified: Secondary | ICD-10-CM | POA: Insufficient documentation

## 2021-09-08 NOTE — Therapy (Addendum)
Occupational Therapy Progress Note  Dates of reporting period  08/04/2021   to   09/08/2021   Patient Name: Kelly Rollins MRN: [id number] DOB:[birth year], 51 y.o., female 63 Date: 09/08/2021  PCP: [name], PA-C REFERRING PROVIDER: [name], Corrine Jeani Hawking, PA-C   OT End of Session - 09/08/21 1640     Visit Number 10    Number of Visits 24    Date for OT Re-Evaluation 10/27/21    Authorization Time Period Progress reports period starting 08/04/2021    OT Start Time 1600    OT Stop Time 1645    OT Time Calculation (min) 45 min    Activity Tolerance Patient tolerated treatment well    Behavior During Therapy [facility] for tasks assessed/performed             Past Medical History:  Diagnosis Date   ABDOMINAL PAIN OTHER SPECIFIED SITE 02/21/2009   Qualifier: Diagnosis of  By: Deborra Medina MD, Talia     ABDOMINAL PAIN-LUQ 04/17/2009   Qualifier: Diagnosis of  By: Fuller Plan MD Lamont Snowball T    Anal fissure    Anginal pain ([place])    Asthma    Depression    Diabetes mellitus without complication ([place])    Dyspnea    Dysrhythmia    Headache(784.0)    HEMATOCHEZIA 04/17/2009   Qualifier: Diagnosis of  By: Fuller Plan MD Lamont Snowball T    Hyperlipidemia    Hypertension    IBS (irritable bowel syndrome)    Migraine headache 11/06/2008   Qualifier: Diagnosis of  By: Deborra Medina MD, Talia     OVARIAN CYST, RIGHT 02/21/2009   Qualifier: Diagnosis of  By: Deborra Medina MD, Talia     PALPITATIONS, OCCASIONAL 04/17/2010   Qualifier: Diagnosis of  By: Deborra Medina MD, Talia     Pancreatitis 10/14/2017   Past Surgical History:  Procedure Laterality Date   BREAST EXCISIONAL BIOPSY Left 1997   ? left side, done at time of reduction , benign   BREAST REDUCTION SURGERY     bilateral   CESAREAN SECTION     CHOLECYSTECTOMY     COLONOSCOPY     ESOPHAGOGASTRODUODENOSCOPY     ESOPHAGOGASTRODUODENOSCOPY (EGD) WITH PROPOFOL N/A 10/21/2020   Procedure: ESOPHAGOGASTRODUODENOSCOPY (EGD) WITH PROPOFOL;  Surgeon:  Lesly Rubenstein, MD;  Location: [facility] ENDOSCOPY;  Service: Endoscopy;  Laterality: N/A;   REDUCTION MAMMAPLASTY Bilateral 1997   VAGINAL HYSTERECTOMY     Patient Active Problem List   Diagnosis Date Noted   Abnormal MRI, lumbar spine (01/29/2020) 02/19/2020   Anxiety due to MRI 01/25/2020   Claustrophobia associated to MRI 01/25/2020   Enthesopathy of knee region (Right) 01/25/2020   Enthesopathy of knee region (Left) 01/25/2020   Lumbar facet hypertrophy 01/25/2020   Lumbosacral radiculopathy at S1 (Left) 01/17/2020   Lumbosacral radiculopathy (S1) (Left) 01/17/2020   Chronic lower extremity pain (Bilateral) (L>R) 01/17/2020   History of pancreatitis 07/18/2019   Obstructive sleep apnea syndrome 01/02/2019   Lumbar facet arthropathy (Multilevel) (Bilateral) 12/14/2018   Osteoarthritis involving multiple joints 11/28/2018   Chronic hip pain (Left) 11/16/2018   Acute hip pain (Left) 11/16/2018   DDD (degenerative disc disease), lumbosacral 11/16/2018   Chronic hip pain (Secondary source of pain) (Bilateral) (L>R) 05/19/2017   Elevated C-reactive protein (CRP) 05/19/2017   Elevated sed rate 05/19/2017   Spondylosis without myelopathy or radiculopathy, lumbosacral region 05/19/2017   Pharmacologic therapy 05/06/2017   Disorder of skeletal system 05/06/2017   Problems influencing health status 05/06/2017  Chest pain with moderate risk of acute coronary syndrome 04/21/2017   DOE (dyspnea on exertion) 04/21/2017   Intermittent palpitations 04/21/2017   Vitamin D insufficiency 05/04/2016   Depression 02/04/2016   Fatty liver 02/04/2016   Hypertension associated with diabetes ([place]) 02/04/2016   Chronic pain syndrome 01/15/2016   Acute low back pain 05/28/2015   Lumbar transverse process fracture (HCC) (Left L1) (03/11/15) 05/28/2015   Long term current use of opiate analgesic 05/01/2015   Long term prescription opiate use 05/01/2015   Opiate use (25 MME/Day) 05/01/2015    Encounter for therapeutic drug level monitoring 05/01/2015   Encounter for pain management planning 05/01/2015   Chronic low back pain (Primary Source of Pain) (Bilateral) (L>R) 05/01/2015   Lumbar facet syndrome (Bilateral) (L>R) 05/01/2015   Chronic knee pain [facility] source of pain) (Bilateral) (L>R) 05/01/2015   Lumbar spondylosis 05/01/2015   Neuropathic pain 05/01/2015   Neurogenic pain 05/01/2015   Myofascial pain 05/01/2015   Essential hypertriglyceridemia 04/26/2014   Type 2 diabetes mellitus with hyperglycemia, with long-term current use of insulin ([place]) 04/26/2014   Anxiety 05/15/2010   Obesity 05/15/2010   Hyperlipidemia 04/03/2010   Fatty liver disease 04/02/2010   Pure hypercholesterolemia 03/19/2010   GERD 04/17/2009   Constipation 04/17/2009   TOBACCO ABUSE 11/06/2008    ONSET DATE: 02/06/2021  REFERRING DIAG: Astrocytoma  THERAPY DIAG:  Muscle weakness (generalized)  Rationale for Evaluation and Treatment Rehabilitation  SUBJECTIVE:  Pt. Reports they are in the process of getting her medications clarified with the pharmacy, and MD office.   SUBJECTIVE STATEMENT: Pt. Reports that it is her daughter's birthday today. Pt accompanied by: significant other  PERTINENT HISTORY:  Pt. is a 51 y.o. 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 PMHx includes: Anorexia, dehydration, Adult Failure to thrive, contracture of muscles multiple sites. Pt./caregiver reports recently being hospitalized with a UTI, and dehydration.   PRECAUTIONS: Fall  WEIGHT BEARING RESTRICTIONS No  PAIN:  Are you having pain?  Pt. Reports 3/10 pain in the right UE, and digits. Pt. intermittent facial, and verbal responses reflect increased pain levels.  FALLS: Has patient fallen in last 6 months? Yes. Number of falls Multiple Falls  LIVING ENVIRONMENT: Lives with: Family Lives in:  House/apartment Stairs: Yes: External: 4 steps; bilateral but cannot reach both Has following equipment at home: Single point cane, Walker - 4 wheeled, Hemi walker, shower chair, bed side commode, and Grab bars Lift chair in the tub, [place],  PLOF: Independent  PATIENT GOALS: To improve the right UE  OBJECTIVE:   HAND DOMINANCE: Right  ADLs: Transfers/ambulation related to ADLs: Eating: MaxA with cutting food, setting up, Modified I with nondominant Left hand, [place]  Grooming: Independent  UB Dressing: MaxA  LB Dressing: MaxA Toileting: Uses a BSCommode mInA transfers, MaxA clothing negotiation Bathing: MaxA Tub Shower transfers:  Uses a shower lift chair Equipment:  w/c, BSCommode, cane   IADLs: Shopping: Husband performs Light housekeeping: Max Meal Prep: MaxA Community mobility: Relies on family Medication management:Husband assists Doctor, hospital: Performs together Handwriting: Not legible  MOBILITY STATUS: Hx of falls    ACTIVITY TOLERANCE: Activity tolerance: limited  FUNCTIONAL OUTCOME MEASURES: FOTO: 11.76  UPPER EXTREMITY ROM  : WFL Left AROM/PROM   Passive ROM Right eval Right  08/18/21 Right 09/08/2021  Shoulder flexion 30  48  Shoulder abduction 40 62 53  Shoulder adduction     Shoulder  extension     Shoulder internal rotation     Shoulder external rotation     Elbow flexion 120  110  Elbow extension -67  -46  Wrist flexion Neutral  Neutral  Wrist extension 12  40  Wrist ulnar deviation     Wrist radial deviation     Wrist pronation     Wrist supination     (Blank rows = not tested)   UPPER EXTREMITY MMT:     MMT Right eval Left eval  Shoulder flexion 0/5 4+/5  Shoulder abduction 0/5 4+/5  Shoulder adduction    Shoulder extension    Shoulder internal rotation    Shoulder external rotation    Middle trapezius    Lower trapezius    Elbow flexion 0/5 4+/5  Elbow extension 0/5 4+/5  Wrist flexion    Wrist extension 0/5  4+/5  Wrist ulnar deviation    Wrist radial deviation    Wrist pronation    Wrist supination    (Blank rows = not tested)  FOTO score: 09/08/2021: 11    SENSATION: Light touch: WFL  EDEMA:   Eval,  09/08/2021 Wrist: 18.5 cm,     17.5cm MCP: 21.5 cm,     20.5cm    MUSCLE TONE: RUE: Severe  COGNITION: Overall cognitive status: Impaired  VISION: Subjective report: Wears glasses Baseline vision: Wears glasses all the time Pt. Reports no changes    PRAXIS: Impaired: Motor planning     TODAY'S TREATMENT:   Pt. tolerated retrograde massage to the right hand, and digits secondary to edema. Pt. tolerated soft tissue massage to the muscles in the right scapular region. Manual therapy was perormed prior to, and in preparation for ROM, and therapeutic Ex. Pt. tolerated PROM for right wrist flexion and extension, digit flexion and extension, and thumb abduction. Pt./caregiver education was provided about  the  right hand palm protector, edema control techniques, ROM, positioning in right shoulder. Pt. Continues to tolerate the palm protector with 1" roll without skin irritation, or breakdown in the right palm. Pt. Does have an area of redness, and irritation on the lateral aspect of the 2nd digit from the thumb nail when she is not wearing the palm protector.   Measurements were obtained, and goals were reviewed with the pt.  Pt. has improved with edema in the right hand. Pt. Has progressed with her goals. Pt.'s PROM in right shoulder flexion, and abduction, elbow extension, and wrist extension. Pt. continues to be  limited by pain, however pt. was able to tolerate increased shoulder abduction, and digit extension today. Pt reports 3/10 pain. Pt. Continues to present with increased flexor tightness in the right 2nd, and 3rd digits. Pt. continues to initially maintain the digits on her right hand in tight flexor synergy pattern. Pt. was able to tolerate retrograde massage for edema control,  and ROM to the right hand, and digits. Pt. continues to work on improving pain, and improving RUE ROM in order to improve engagement with ADLs/IADLs, and promote good skin integrity through the palmar surface of the hand.   PATIENT EDUCATION: Education details: Right UE ROM, positioning of the RUE Person educated: Patient and Spouse Education method: Explanation, Demonstration, Tactile cues, and Verbal cues Education comprehension: verbalized understanding, returned demonstration, and needs further education   HOME EXERCISE PROGRAM:   Pt./caregiver education about positioning of the RUE, ROM    GOALS: Goals reviewed with patient? Yes  SHORT TERM GOALS: Target date: 09/15/2021  Pt./caregivers will demonstrate independence with HEPs for UE ROM, and edema control Baseline: 09/08/2021: Pt. Requires assist for positioning, ROM, edema control. Eval: No current HEPs  Goal status: Ongoing    LONG TERM GOALS: Target date: 10/27/2021    Pt. Will improve right hand edema by 1 cm at the wrist, and MCPs Baseline: 09/08/2021: Wrist: 17.5 cm, MCPs: 20.5 cm Eval: Wrist: 18.5cm, MCPs: 21.5cm Goal status: Ongoing  2.  Pt. Will improve right shoulder PROM by 10 degrees with ADLs Baseline: 09/08/2021: Right shoulder flexion: 48, abduction: 53 Eval: PROM shoulder flexion: 30 degrees, Abduction: 40 degrees Goal status: Ongoing  3.  Pt. Will improve right elbow PROM by 10 degrees to prepare for hand to mouth patterns Baseline: 09/08/2021: elbow extension: -46, flexion: 110 Eval:   elbow extension -67, flexion 120 Goal status: Ongoing  4.  Pt. Will improve PROM right wrist flexion, and extension by 10 degrees Baseline: 09/08/2021: Wrist extension: 40Eval: wrist extension 12 degrees, flexion to neutral Goal status: Ongoing  5.  Pt. Will tolerate full digit extension in the right hand to promote good skin integrity through the palmar surface of the right hand  Baseline: 09/08/2021: Pt. Is able to  tolerate PROM through 50% of digit extension in the digits. Eval: Pt. Maintains digits in tight flexion Goal status: Ongoing  6.  Pt. Will require minA UE dressing using one armed dressing techniques. Baseline: 09/08/2021: MaxA Eval: MaxA Goal status: Ongoing  ASSESSMENT:  CLINICAL IMPRESSION:  Measurements were obtained, and goals were reviewed with the pt.  Pt. has improved with edema in the right hand. Pt. Has progressed with her goals. Pt.'s PROM in right shoulder flexion, and abduction, elbow extension, and wrist extension. Pt. continues to be  limited by pain, however pt. was able to tolerate increased shoulder abduction, and digit extension today. Pt reports 3/10 pain. Pt. Continues to present with increased flexor tightness in the right 2nd, and 3rd digits. Pt. continues to initially maintain the digits on her right hand in tight flexor synergy pattern. Pt. was able to tolerate retrograde massage for edema control, and ROM to the right hand, and digits. Pt. continues to work on improving pain, and improving RUE ROM in order to improve engagement with ADLs/IADLs, and promote good skin integrity through the palmar surface of the hand.  PERFORMANCE DEFICITS in functional skills including ADLs, IADLs, coordination, proprioception, sensation, edema, tone, ROM, strength, pain, FMC, GMC, balance, decreased knowledge of use of DME, skin integrity, and UE functional use, cognitive skills including attention, memory, and safety awareness, and psychosocial skills including coping strategies.   IMPAIRMENTS are limiting patient from ADLs, IADLs, education, work, leisure, and social participation.   COMORBIDITIES may have co-morbidities  that affects occupational performance. Patient will benefit from skilled OT to address above impairments and improve overall function.  MODIFICATION OR ASSISTANCE TO COMPLETE EVALUATION: Maximum modification of tasks or assist with assess necessary to complete an  evaluation.  OT OCCUPATIONAL PROFILE AND HISTORY: Detailed assessment: Review of records and additional review of physical, cognitive, psychosocial history related to current functional performance.  CLINICAL DECISION MAKING: High - multiple treatment options, significant modification of task necessary  REHAB POTENTIAL: Good for stated goals  EVALUATION COMPLEXITY: High    PLAN: OT FREQUENCY: 2x/week  OT DURATION: 12 weeks  PLANNED INTERVENTIONS: self care/ADL training, therapeutic exercise, therapeutic activity, neuromuscular re-education, manual therapy, passive range of motion, paraffin, moist heat, contrast bath, patient/family education, cognitive remediation/compensation, energy conservation, and coping strategies training  RECOMMENDED OTHER SERVICES: PT, and ST  CONSULTED AND AGREED WITH PLAN OF CARE: Patient and family member/caregiver  PLAN FOR NEXT SESSION: Initiate PROM, and RUE Edema control techniques   Harrel Carina, MS, OTR/L  Harrel Carina, OT 09/08/2021, 4:42 PM

## 2021-09-08 NOTE — Therapy (Signed)
OUTPATIENT SPEECH LANGUAGE PATHOLOGY TREATMENT NOTE   Patient Name: Kelly Rollins MRN: [id number] DOB:1970-06-23, 51 y.o., female Today's Date: 09/08/2021  PCP: Grayland Ormond, PA REFERRING PROVIDER: Margarita Grizzle, NP  END OF SESSION:   End of Session - 09/08/21 1504     Visit Number 4    Number of Visits 25    Date for SLP Re-Evaluation 11/13/21    Authorization Type [employer]    Progress Note Due on Visit 10    SLP Start Time 1500    SLP Stop Time  1600    SLP Time Calculation (min) 60 min    Activity Tolerance Patient tolerated treatment well             Past Medical History:  Diagnosis Date   ABDOMINAL PAIN OTHER SPECIFIED SITE 02/21/2009   Qualifier: Diagnosis of  By: Deborra Medina MD, Talia     ABDOMINAL PAIN-LUQ 04/17/2009   Qualifier: Diagnosis of  By: Fuller Plan MD Lamont Snowball T    Anal fissure    Anginal pain ([place])    Asthma    Depression    Diabetes mellitus without complication ([place])    Dyspnea    Dysrhythmia    Headache(784.0)    HEMATOCHEZIA 04/17/2009   Qualifier: Diagnosis of  By: Fuller Plan MD Lamont Snowball T    Hyperlipidemia    Hypertension    IBS (irritable bowel syndrome)    Migraine headache 11/06/2008   Qualifier: Diagnosis of  By: Deborra Medina MD, Talia     OVARIAN CYST, RIGHT 02/21/2009   Qualifier: Diagnosis of  By: Deborra Medina MD, Talia     PALPITATIONS, OCCASIONAL 04/17/2010   Qualifier: Diagnosis of  By: Deborra Medina MD, Talia     Pancreatitis 10/14/2017   Past Surgical History:  Procedure Laterality Date   BREAST EXCISIONAL BIOPSY Left 1997   ? left side, done at time of reduction , benign   BREAST REDUCTION SURGERY     bilateral   CESAREAN SECTION     CHOLECYSTECTOMY     COLONOSCOPY     ESOPHAGOGASTRODUODENOSCOPY     ESOPHAGOGASTRODUODENOSCOPY (EGD) WITH PROPOFOL N/A 10/21/2020   Procedure: ESOPHAGOGASTRODUODENOSCOPY (EGD) WITH PROPOFOL;  Surgeon: Lesly Rubenstein, MD;  Location: [facility] ENDOSCOPY;  Service: Endoscopy;  Laterality:  N/A;   REDUCTION MAMMAPLASTY Bilateral 1997   VAGINAL HYSTERECTOMY     Patient Active Problem List   Diagnosis Date Noted   Abnormal MRI, lumbar spine (01/29/2020) 02/19/2020   Anxiety due to MRI 01/25/2020   Claustrophobia associated to MRI 01/25/2020   Enthesopathy of knee region (Right) 01/25/2020   Enthesopathy of knee region (Left) 01/25/2020   Lumbar facet hypertrophy 01/25/2020   Lumbosacral radiculopathy at S1 (Left) 01/17/2020   Lumbosacral radiculopathy (S1) (Left) 01/17/2020   Chronic lower extremity pain (Bilateral) (L>R) 01/17/2020   History of pancreatitis 07/18/2019   Obstructive sleep apnea syndrome 01/02/2019   Lumbar facet arthropathy (Multilevel) (Bilateral) 12/14/2018   Osteoarthritis involving multiple joints 11/28/2018   Chronic hip pain (Left) 11/16/2018   Acute hip pain (Left) 11/16/2018   DDD (degenerative disc disease), lumbosacral 11/16/2018   Chronic hip pain (Secondary source of pain) (Bilateral) (L>R) 05/19/2017   Elevated C-reactive protein (CRP) 05/19/2017   Elevated sed rate 05/19/2017   Spondylosis without myelopathy or radiculopathy, lumbosacral region 05/19/2017   Pharmacologic therapy 05/06/2017   Disorder of skeletal system 05/06/2017   Problems influencing health status 05/06/2017   Chest pain with moderate risk of acute coronary syndrome 04/21/2017   DOE (  dyspnea on exertion) 04/21/2017   Intermittent palpitations 04/21/2017   Vitamin D insufficiency 05/04/2016   Depression 02/04/2016   Fatty liver 02/04/2016   Hypertension associated with diabetes ([place]) 02/04/2016   Chronic pain syndrome 01/15/2016   Acute low back pain 05/28/2015   Lumbar transverse process fracture (HCC) (Left L1) (03/11/15) 05/28/2015   Long term current use of opiate analgesic 05/01/2015   Long term prescription opiate use 05/01/2015   Opiate use (25 MME/Day) 05/01/2015   Encounter for therapeutic drug level monitoring 05/01/2015   Encounter for pain management  planning 05/01/2015   Chronic low back pain (Primary Source of Pain) (Bilateral) (L>R) 05/01/2015   Lumbar facet syndrome (Bilateral) (L>R) 05/01/2015   Chronic knee pain [facility] source of pain) (Bilateral) (L>R) 05/01/2015   Lumbar spondylosis 05/01/2015   Neuropathic pain 05/01/2015   Neurogenic pain 05/01/2015   Myofascial pain 05/01/2015   Essential hypertriglyceridemia 04/26/2014   Type 2 diabetes mellitus with hyperglycemia, with long-term current use of insulin ([place]) 04/26/2014   Anxiety 05/15/2010   Obesity 05/15/2010   Hyperlipidemia 04/03/2010   Fatty liver disease 04/02/2010   Pure hypercholesterolemia 03/19/2010   GERD 04/17/2009   Constipation 04/17/2009   TOBACCO ABUSE 11/06/2008    ONSET DATE: 02/14/2021 date of initial dx; 08/19/2021 date of referral      REFERRING DIAG: R47.01 (ICD-10-CM) - Aphasia   PERTINENT HISTORY: PMH T2DM and 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 Colin Mulders in 02/14/21 s/p radiation and TMZ.    DIAGNOSTIC FINDINGS: MRI 07/10/2021 Within limitations of motion artifact, no significant difference in left frontal based mass crossing midline through the corpus callosum. On conventional T1 postcontrast imaging, the previous areas of hyper enhancement appear to have diminished some degree. No definite areas of new involvement. Redemonstrated left frontal stereotactic biopsy tract.   THERAPY DIAG:  Aphasia  Cognitive communication deficit  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[place])  Rationale for Evaluation and Treatment Rehabilitation  SUBJECTIVE: pt states that she feels better, has gotten better sleep this week  Pt accompanied by: significant other  PAIN:  Are you having pain?  Yes, monitored with repositioning  PATIENT GOALS: to be able to speak fluently  OBJECTIVE:   TODAY'S TREATMENT: Skilled treatment session focused on pt's word finding deficits.  SLP facilitated session by sending pictures of pt's medicines, daughters, grandchildren to email to load on potential SGD. SLP further facilitated session by informally assessing pt's reading comprehension by using the [facility] 6: Verbal Expression - Word Recall from Descriptions ([id number]). Pt read the phrases aloud and demonstrated good reading comprehension in 31 out of 31 opportunities. Pt's oral reading is c/b word deletion, phonemic paraphasias resulting in ~ 50-60% speech intelligibility.    PATIENT EDUCATION: Education details: tasks to facilitate word finding Person educated: Patient and Spouse Education method: Explanation, Demonstration, and Verbal cues Education comprehension: verbalized understanding and needs further education  GOALS: Goals reviewed with patient? Yes  SHORT TERM GOALS: Target date: 10 sessions   Given basic category, pt will generate at least 6 members in 7 out of 10 opportunities with minimal cues.  Baseline: 3 members in basic category Goal status: INITIAL   2.  Pt will demonstrate use of at least 1 word finding strategy to aid in communication breakdowns across 5 sessions with minimal cues.  Baseline: introduced concept 08/20/2021 Goal status: INITIAL   3.  Pt will expressively produce 4+ word sentence in  response to situation/picture with 70% accuracy given minimal cues in order to increase ability to communicate basic wants needs. Baseline: 3 word Goal status: INITIAL     LONG TERM GOALS: Target date: 11/12/2021   Pt and caregiver will utilize word finding strategies to communicate pt's basic wants/needs in 5 out of 10 opportunities.  Baseline: introduced 08/20/2021 Goal status: INITIAL    ASSESSMENT:  CLINICAL IMPRESSION: Pt continues with fluctuating expressive communication deficits. Will request trial SGD from [place] to assess if pt is a good candidate for use.   OBJECTIVE IMPAIRMENTS include attention, expressive language, and aphasia.  These impairments are limiting patient from managing medications, managing appointments, managing finances, household responsibilities, ADLs/IADLs, and effectively communicating at home and in community. Factors affecting potential to achieve goals and functional outcome are  astrocytoma . Patient will benefit from skilled SLP services to address above impairments and improve overall function.  REHAB POTENTIAL: Fair ; astrocytomia  PLAN: SLP FREQUENCY: 1-2x/week  SLP DURATION: 12 weeks  PLANNED INTERVENTIONS: Language facilitation, Functional tasks, SLP instruction and feedback, and Patient/family education  Gabbi Whetstone B. Rutherford Nail, M.S., CCC-SLP, Mining engineer Certified [facility] [place]  [place] Office [phone] Ascom [phone] Fax [phone]

## 2021-09-10 ENCOUNTER — Encounter: Payer: Self-pay | Admitting: Occupational Therapy

## 2021-09-10 ENCOUNTER — Ambulatory Visit: Payer: Medicare Other | Admitting: Occupational Therapy

## 2021-09-10 ENCOUNTER — Ambulatory Visit: Payer: Medicare Other | Admitting: Speech Pathology

## 2021-09-10 DIAGNOSIS — R4701 Aphasia: Secondary | ICD-10-CM

## 2021-09-10 DIAGNOSIS — M6281 Muscle weakness (generalized): Secondary | ICD-10-CM

## 2021-09-10 DIAGNOSIS — C719 Malignant neoplasm of brain, unspecified: Secondary | ICD-10-CM

## 2021-09-10 NOTE — Therapy (Addendum)
OUTPATIENT OCCUPATIONAL THERAPY TREATMENT  Patient Name: Kelly Rollins MRN: [id number] DOB:[birth year], 51 y.o., female Today's Date: 09/10/2021  PCP: [name], PA-C REFERRING PROVIDER: [name], Corrine Jeani Hawking, PA-C   OT End of Session - 09/10/21 1714     Visit Number 11    Number of Visits 24    Date for OT Re-Evaluation 10/27/21    Authorization Time Period Progress reports period starting 08/04/2021    OT Start Time 1345    OT Stop Time 1430    OT Time Calculation (min) 45 min    Activity Tolerance Patient tolerated treatment well    Behavior During Therapy [facility] for tasks assessed/performed             Past Medical History:  Diagnosis Date   ABDOMINAL PAIN OTHER SPECIFIED SITE 02/21/2009   Qualifier: Diagnosis of  By: Deborra Medina MD, Talia     ABDOMINAL PAIN-LUQ 04/17/2009   Qualifier: Diagnosis of  By: Fuller Plan MD Lamont Snowball T    Anal fissure    Anginal pain ([place])    Asthma    Depression    Diabetes mellitus without complication ([place])    Dyspnea    Dysrhythmia    Headache(784.0)    HEMATOCHEZIA 04/17/2009   Qualifier: Diagnosis of  By: Fuller Plan MD Lamont Snowball T    Hyperlipidemia    Hypertension    IBS (irritable bowel syndrome)    Migraine headache 11/06/2008   Qualifier: Diagnosis of  By: Deborra Medina MD, Talia     OVARIAN CYST, RIGHT 02/21/2009   Qualifier: Diagnosis of  By: Deborra Medina MD, Talia     PALPITATIONS, OCCASIONAL 04/17/2010   Qualifier: Diagnosis of  By: Deborra Medina MD, Talia     Pancreatitis 10/14/2017   Past Surgical History:  Procedure Laterality Date   BREAST EXCISIONAL BIOPSY Left 1997   ? left side, done at time of reduction , benign   BREAST REDUCTION SURGERY     bilateral   CESAREAN SECTION     CHOLECYSTECTOMY     COLONOSCOPY     ESOPHAGOGASTRODUODENOSCOPY     ESOPHAGOGASTRODUODENOSCOPY (EGD) WITH PROPOFOL N/A 10/21/2020   Procedure: ESOPHAGOGASTRODUODENOSCOPY (EGD) WITH PROPOFOL;  Surgeon: Lesly Rubenstein, MD;  Location: [facility] ENDOSCOPY;   Service: Endoscopy;  Laterality: N/A;   REDUCTION MAMMAPLASTY Bilateral 1997   VAGINAL HYSTERECTOMY     Patient Active Problem List   Diagnosis Date Noted   Abnormal MRI, lumbar spine (01/29/2020) 02/19/2020   Anxiety due to MRI 01/25/2020   Claustrophobia associated to MRI 01/25/2020   Enthesopathy of knee region (Right) 01/25/2020   Enthesopathy of knee region (Left) 01/25/2020   Lumbar facet hypertrophy 01/25/2020   Lumbosacral radiculopathy at S1 (Left) 01/17/2020   Lumbosacral radiculopathy (S1) (Left) 01/17/2020   Chronic lower extremity pain (Bilateral) (L>R) 01/17/2020   History of pancreatitis 07/18/2019   Obstructive sleep apnea syndrome 01/02/2019   Lumbar facet arthropathy (Multilevel) (Bilateral) 12/14/2018   Osteoarthritis involving multiple joints 11/28/2018   Chronic hip pain (Left) 11/16/2018   Acute hip pain (Left) 11/16/2018   DDD (degenerative disc disease), lumbosacral 11/16/2018   Chronic hip pain (Secondary source of pain) (Bilateral) (L>R) 05/19/2017   Elevated C-reactive protein (CRP) 05/19/2017   Elevated sed rate 05/19/2017   Spondylosis without myelopathy or radiculopathy, lumbosacral region 05/19/2017   Pharmacologic therapy 05/06/2017   Disorder of skeletal system 05/06/2017   Problems influencing health status 05/06/2017   Chest pain with moderate risk of acute coronary syndrome 04/21/2017   DOE (  dyspnea on exertion) 04/21/2017   Intermittent palpitations 04/21/2017   Vitamin D insufficiency 05/04/2016   Depression 02/04/2016   Fatty liver 02/04/2016   Hypertension associated with diabetes ([place]) 02/04/2016   Chronic pain syndrome 01/15/2016   Acute low back pain 05/28/2015   Lumbar transverse process fracture (HCC) (Left L1) (03/11/15) 05/28/2015   Long term current use of opiate analgesic 05/01/2015   Long term prescription opiate use 05/01/2015   Opiate use (25 MME/Day) 05/01/2015   Encounter for therapeutic drug level monitoring 05/01/2015    Encounter for pain management planning 05/01/2015   Chronic low back pain (Primary Source of Pain) (Bilateral) (L>R) 05/01/2015   Lumbar facet syndrome (Bilateral) (L>R) 05/01/2015   Chronic knee pain [facility] source of pain) (Bilateral) (L>R) 05/01/2015   Lumbar spondylosis 05/01/2015   Neuropathic pain 05/01/2015   Neurogenic pain 05/01/2015   Myofascial pain 05/01/2015   Essential hypertriglyceridemia 04/26/2014   Type 2 diabetes mellitus with hyperglycemia, with long-term current use of insulin ([place]) 04/26/2014   Anxiety 05/15/2010   Obesity 05/15/2010   Hyperlipidemia 04/03/2010   Fatty liver disease 04/02/2010   Pure hypercholesterolemia 03/19/2010   GERD 04/17/2009   Constipation 04/17/2009   TOBACCO ABUSE 11/06/2008    ONSET DATE: 02/06/2021  REFERRING DIAG: Astrocytoma  THERAPY DIAG:  Muscle weakness (generalized)  Rationale for Evaluation and Treatment Rehabilitation  SUBJECTIVE:  Pt. Reports they are in the process of getting her medications clarified with the pharmacy, and MD office.   SUBJECTIVE STATEMENT: Pt. Reports that it is her daughter's birthday today. Pt accompanied by: significant other  PERTINENT HISTORY:  Pt. Is a 51 y.o. 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 PMHx includes: Anorexia, dehydration, Adult Failure to thrive, contracture of muscles multiple sites. Pt./caregiver reports recently being hospitalized with a UTI, and dehydration.   PRECAUTIONS: Fall  WEIGHT BEARING RESTRICTIONS No  PAIN:  Are you having pain?  Pt. Reports 3/10 pain in the right UE, and digits. Pt. intermittent facial, and verbal responses reflect increased pain levels.  FALLS: Has patient fallen in last 6 months? Yes. Number of falls Multiple Falls  LIVING ENVIRONMENT: Lives with: Family Lives in: House/apartment Stairs: Yes: External: 4 steps; bilateral but cannot reach  both Has following equipment at home: Single point cane, Walker - 4 wheeled, Hemi walker, shower chair, bed side commode, and Grab bars Lift chair in the tub, [place],  PLOF: Independent  PATIENT GOALS: To improve the right UE  OBJECTIVE:   HAND DOMINANCE: Right  ADLs: Transfers/ambulation related to ADLs: Eating: MaxA with cutting food, setting up, Modified I with nondominant Left hand, Silicon Mat  Grooming: Independent  UB Dressing: MaxA  LB Dressing: MaxA Toileting: Uses a BSCommode mInA transfers, MaxA clothing negotiation Bathing: MaxA Tub Shower transfers:  Uses a shower lift chair Equipment:  w/c, BSCommode, cane   IADLs: Shopping: Husband performs Light housekeeping: Max Meal Prep: MaxA Community mobility: Relies on family Medication management:Husband assists Doctor, hospital: Performs together Handwriting: Not legible  MOBILITY STATUS: Hx of falls    ACTIVITY TOLERANCE: Activity tolerance: limited  FUNCTIONAL OUTCOME MEASURES: FOTO: 11.76  UPPER EXTREMITY ROM  : WFL Left AROM/PROM   Passive ROM Right eval Right  08/18/21  Shoulder flexion 30   Shoulder abduction 40 62  Shoulder adduction    Shoulder extension    Shoulder internal rotation    Shoulder external rotation    Elbow flexion  120   Elbow extension -67   Wrist flexion Neutral   Wrist extension 12   Wrist ulnar deviation    Wrist radial deviation    Wrist pronation    Wrist supination    (Blank rows = not tested)   UPPER EXTREMITY MMT:     MMT Right eval Left eval  Shoulder flexion 0/5 4+/5  Shoulder abduction 0/5 4+/5  Shoulder adduction    Shoulder extension    Shoulder internal rotation    Shoulder external rotation    Middle trapezius    Lower trapezius    Elbow flexion 0/5 4+/5  Elbow extension 0/5 4+/5  Wrist flexion    Wrist extension 0/5 4+/5  Wrist ulnar deviation    Wrist radial deviation    Wrist pronation    Wrist supination    (Blank rows = not  tested)    SENSATION: Light touch: WFL  EDEMA:   Wrist: 18.5 cm MCP: 21.5 cm    MUSCLE TONE: RUE: Severe  COGNITION: Overall cognitive status: Impaired  VISION: Subjective report: Wears glasses Baseline vision: Wears glasses all the time Pt. Reports no changes    PRAXIS: Impaired: Motor planning     TODAY'S TREATMENT:   Pt. tolerated retrograde massage to the right hand, and digits secondary to edema. Pt. tolerated soft tissue massage to the muscles in the right scapular region. Manual therapy was perormed prior to, and in preparation for ROM, and therapeutic Ex. Pt. tolerated PROM for right wrist flexion and extension, digit flexion and extension, and thumb abduction. Pt./caregiver education was provided about  the  right hand palm protector, edema control techniques, ROM, positioning in right shoulder. Pt. Continues to tolerate the palm protector with 1" roll without skin irritation, or breakdown in the right palm. Pt. Does have an area of redness, and irritation on the lateral aspect of the 2nd digit from the thumb nail when she is not wearing the palm protector.   Pt. presents with a red area on the lateral aspect of her right 2nd digit from the thumb nail when pt. is not wearing the palm protector secondary to tight flexor synergy. Pt.'s right shoulder flexion, and abduction, and digit extension ROM continues to be  limited by pain, however pt. was able to tolerate increased shoulder abduction, and digit extension today. Pt reports 3/10 pain.  Pt. continues to be able to tolerate slightly more abduction today following moist heat pack modality to the right shoulder, and soft tissue massage to the scapular region. Pt. presents with increased flexor tightness in the right 2nd, and 3rd digits.  Pt. continues to initially maintain the digits on her right hand in tight flexor synergy pattern. Pt. was able to tolerate retrograde massage for edema control, and ROM to the right hand,  and digits. Pt. continues to work on improving pain, and improving RUE ROM in order to improve engagement with ADLs/IADLs, and promote good skin integrity through the palmar surface of the hand.   PATIENT EDUCATION: Education details: Right UE ROM, positioning of the RUE Person educated: Patient and Spouse Education method: Explanation, Demonstration, Tactile cues, and Verbal cues Education comprehension: verbalized understanding, returned demonstration, and needs further education   HOME EXERCISE PROGRAM:   Pt./caregiver education about positioning of the RUE, ROM    GOALS: Goals reviewed with patient? Yes  SHORT TERM GOALS: Target date: 09/15/2021    Pt./caregivers will demonstrate independence with HEPs for UE ROM, and edema control Baseline: 09/08/2021: Pt. Requires assist  for positioning, ROM, edema control. Eval: No current HEPs  Goal status: Ongoing    LONG TERM GOALS: Target date: 10/27/2021    Pt. Will improve right hand edema by 1 cm at the wrist, and MCPs Baseline: 09/08/2021: Wrist: 17.5 cm, MCPs: 20.5 cm Eval: Wrist: 18.5cm, MCPs: 21.5cm Goal status: Ongoing  2.  Pt. Will improve right shoulder PROM by 10 degrees with ADLs Baseline: 09/08/2021: Right shoulder flexion: 48, abduction: 53 Eval: PROM shoulder flexion: 30 degrees, Abduction: 40 degrees Goal status: Ongoing  3.  Pt. Will improve right elbow PROM by 10 degrees to prepare for hand to mouth patterns Baseline: 09/08/2021: elbow extension: -46, flexion: 110 Eval:   elbow extension -67, flexion 120 Goal status: Ongoing  4.  Pt. Will improve PROM right wrist flexion, and extension by 10 degrees Baseline: 09/08/2021: Wrist extension: 40Eval: wrist extension 12 degrees, flexion to neutral Goal status: Ongoing  5.  Pt. Will tolerate full digit extension in the right hand to promote good skin integrity through the palmar surface of the right hand  Baseline: 09/08/2021: Pt. Is able to tolerate PROM through 50% of  digit extension in the digits. Eval: Pt. Maintains digits in tight flexion Goal status: Ongoing  6.  Pt. Will require minA UE dressing using one armed dressing techniques. Baseline: 09/08/2021: MaxA Eval: MaxA Goal status: Ongoing  ASSESSMENT:  CLINICAL IMPRESSION:  Pt. presents with a red area on the lateral aspect of her right 2nd digit from the thumb nail when pt. is not wearing the palm protector secondary to tight flexor synergy. Pt.'s right shoulder flexion, and abduction, and digit extension ROM continues to be  limited by pain, however pt. was able to tolerate increased shoulder abduction, and digit extension today. Pt reports 3/10 pain.  Pt. continues to be able to tolerate slightly more abduction today following moist heat pack modality to the right shoulder, and soft tissue massage to the scapular region. Pt. presents with increased flexor tightness in the right 2nd, and 3rd digits.  Pt. continues to initially maintain the digits on her right hand in tight flexor synergy pattern. Pt. was able to tolerate retrograde massage for edema control, and ROM to the right hand, and digits. Pt. continues to work on improving pain, and improving RUE ROM in order to improve engagement with ADLs/IADLs, and promote good skin integrity through the palmar surface of the hand.  PERFORMANCE DEFICITS in functional skills including ADLs, IADLs, coordination, proprioception, sensation, edema, tone, ROM, strength, pain, FMC, GMC, balance, decreased knowledge of use of DME, skin integrity, and UE functional use, cognitive skills including attention, memory, and safety awareness, and psychosocial skills including coping strategies.   IMPAIRMENTS are limiting patient from ADLs, IADLs, education, work, leisure, and social participation.   COMORBIDITIES may have co-morbidities  that affects occupational performance. Patient will benefit from skilled OT to address above impairments and improve overall  function.  MODIFICATION OR ASSISTANCE TO COMPLETE EVALUATION: Maximum modification of tasks or assist with assess necessary to complete an evaluation.  OT OCCUPATIONAL PROFILE AND HISTORY: Detailed assessment: Review of records and additional review of physical, cognitive, psychosocial history related to current functional performance.  CLINICAL DECISION MAKING: High - multiple treatment options, significant modification of task necessary  REHAB POTENTIAL: Good for stated goals  EVALUATION COMPLEXITY: High    PLAN: OT FREQUENCY: 2x/week  OT DURATION: 12 weeks  PLANNED INTERVENTIONS: self care/ADL training, therapeutic exercise, therapeutic activity, neuromuscular re-education, manual therapy, passive range of motion, paraffin, moist heat,  contrast bath, patient/family education, cognitive remediation/compensation, energy conservation, and coping strategies training  RECOMMENDED OTHER SERVICES: PT, and ST  CONSULTED AND AGREED WITH PLAN OF CARE: Patient and family member/caregiver  PLAN FOR NEXT SESSION: Initiate PROM, and RUE Edema control techniques   Harrel Carina, MS, OTR/L  Harrel Carina, OT 09/10/2021, 5:16 PM           OUTPATIENT OCCUPATIONAL THERAPY TREATMENT  Patient Name: Kelly Rollins MRN: [id number] DOB:03/10/1970, 51 y.o., female Today's Date: 09/10/2021  PCP: [name], PA-C REFERRING PROVIDER: [name], Corrine Jeani Hawking, PA-C   OT End of Session - 09/10/21 1714     Visit Number 11    Number of Visits 24    Date for OT Re-Evaluation 10/27/21    Authorization Time Period Progress reports period starting 08/04/2021    OT Start Time 1345    OT Stop Time 1430    OT Time Calculation (min) 45 min    Activity Tolerance Patient tolerated treatment well    Behavior During Therapy [facility] for tasks assessed/performed             Past Medical History:  Diagnosis Date   ABDOMINAL PAIN OTHER SPECIFIED SITE 02/21/2009   Qualifier:  Diagnosis of  By: Deborra Medina MD, Talia     ABDOMINAL PAIN-LUQ 04/17/2009   Qualifier: Diagnosis of  By: Fuller Plan MD Lamont Snowball T    Anal fissure    Anginal pain ([place])    Asthma    Depression    Diabetes mellitus without complication ([place])    Dyspnea    Dysrhythmia    Headache(784.0)    HEMATOCHEZIA 04/17/2009   Qualifier: Diagnosis of  By: Fuller Plan MD Lamont Snowball T    Hyperlipidemia    Hypertension    IBS (irritable bowel syndrome)    Migraine headache 11/06/2008   Qualifier: Diagnosis of  By: Deborra Medina MD, Talia     OVARIAN CYST, RIGHT 02/21/2009   Qualifier: Diagnosis of  By: Deborra Medina MD, Talia     PALPITATIONS, OCCASIONAL 04/17/2010   Qualifier: Diagnosis of  By: Deborra Medina MD, Talia     Pancreatitis 10/14/2017   Past Surgical History:  Procedure Laterality Date   BREAST EXCISIONAL BIOPSY Left 1997   ? left side, done at time of reduction , benign   BREAST REDUCTION SURGERY     bilateral   CESAREAN SECTION     CHOLECYSTECTOMY     COLONOSCOPY     ESOPHAGOGASTRODUODENOSCOPY     ESOPHAGOGASTRODUODENOSCOPY (EGD) WITH PROPOFOL N/A 10/21/2020   Procedure: ESOPHAGOGASTRODUODENOSCOPY (EGD) WITH PROPOFOL;  Surgeon: Lesly Rubenstein, MD;  Location: [facility] ENDOSCOPY;  Service: Endoscopy;  Laterality: N/A;   REDUCTION MAMMAPLASTY Bilateral 1997   VAGINAL HYSTERECTOMY     Patient Active Problem List   Diagnosis Date Noted   Abnormal MRI, lumbar spine (01/29/2020) 02/19/2020   Anxiety due to MRI 01/25/2020   Claustrophobia associated to MRI 01/25/2020   Enthesopathy of knee region (Right) 01/25/2020   Enthesopathy of knee region (Left) 01/25/2020   Lumbar facet hypertrophy 01/25/2020   Lumbosacral radiculopathy at S1 (Left) 01/17/2020   Lumbosacral radiculopathy (S1) (Left) 01/17/2020   Chronic lower extremity pain (Bilateral) (L>R) 01/17/2020   History of pancreatitis 07/18/2019   Obstructive sleep apnea syndrome 01/02/2019   Lumbar facet arthropathy (Multilevel) (Bilateral) 12/14/2018    Osteoarthritis involving multiple joints 11/28/2018   Chronic hip pain (Left) 11/16/2018   Acute hip pain (Left) 11/16/2018   DDD (degenerative disc disease), lumbosacral 11/16/2018  Chronic hip pain (Secondary source of pain) (Bilateral) (L>R) 05/19/2017   Elevated C-reactive protein (CRP) 05/19/2017   Elevated sed rate 05/19/2017   Spondylosis without myelopathy or radiculopathy, lumbosacral region 05/19/2017   Pharmacologic therapy 05/06/2017   Disorder of skeletal system 05/06/2017   Problems influencing health status 05/06/2017   Chest pain with moderate risk of acute coronary syndrome 04/21/2017   DOE (dyspnea on exertion) 04/21/2017   Intermittent palpitations 04/21/2017   Vitamin D insufficiency 05/04/2016   Depression 02/04/2016   Fatty liver 02/04/2016   Hypertension associated with diabetes ([place]) 02/04/2016   Chronic pain syndrome 01/15/2016   Acute low back pain 05/28/2015   Lumbar transverse process fracture (HCC) (Left L1) (03/11/15) 05/28/2015   Long term current use of opiate analgesic 05/01/2015   Long term prescription opiate use 05/01/2015   Opiate use (25 MME/Day) 05/01/2015   Encounter for therapeutic drug level monitoring 05/01/2015   Encounter for pain management planning 05/01/2015   Chronic low back pain (Primary Source of Pain) (Bilateral) (L>R) 05/01/2015   Lumbar facet syndrome (Bilateral) (L>R) 05/01/2015   Chronic knee pain [facility] source of pain) (Bilateral) (L>R) 05/01/2015   Lumbar spondylosis 05/01/2015   Neuropathic pain 05/01/2015   Neurogenic pain 05/01/2015   Myofascial pain 05/01/2015   Essential hypertriglyceridemia 04/26/2014   Type 2 diabetes mellitus with hyperglycemia, with long-term current use of insulin ([place]) 04/26/2014   Anxiety 05/15/2010   Obesity 05/15/2010   Hyperlipidemia 04/03/2010   Fatty liver disease 04/02/2010   Pure hypercholesterolemia 03/19/2010   GERD 04/17/2009   Constipation 04/17/2009   TOBACCO ABUSE  11/06/2008    ONSET DATE: 02/06/2021  REFERRING DIAG: Astrocytoma  THERAPY DIAG:  Muscle weakness (generalized)  Rationale for Evaluation and Treatment Rehabilitation  SUBJECTIVE:  Pt. Reports they are in the process of getting her medications clarified with the pharmacy, and MD office.   SUBJECTIVE STATEMENT: Pt. Reports that it is her daughter's birthday today. Pt accompanied by: significant other  PERTINENT HISTORY:  Pt. Is a 51 y.o. 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 PMHx includes: Anorexia, dehydration, Adult Failure to thrive, contracture of muscles multiple sites. Pt./caregiver reports recently being hospitalized with a UTI, and dehydration.   PRECAUTIONS: Fall  WEIGHT BEARING RESTRICTIONS No  PAIN:  Are you having pain?  Pt. Reports 3/10 pain in the right UE, and digits. Pt. intermittent facial, and verbal responses reflect increased pain levels.  FALLS: Has patient fallen in last 6 months? Yes. Number of falls Multiple Falls  LIVING ENVIRONMENT: Lives with: Family Lives in: House/apartment Stairs: Yes: External: 4 steps; bilateral but cannot reach both Has following equipment at home: Single point cane, Walker - 4 wheeled, Hemi walker, shower chair, bed side commode, and Grab bars Lift chair in the tub, [place],  PLOF: Independent  PATIENT GOALS: To improve the right UE  OBJECTIVE:   HAND DOMINANCE: Right  ADLs: Transfers/ambulation related to ADLs: Eating: MaxA with cutting food, setting up, Modified I with nondominant Left hand, Silicon Mat  Grooming: Independent  UB Dressing: MaxA  LB Dressing: MaxA Toileting: Uses a BSCommode mInA transfers, MaxA clothing negotiation Bathing: MaxA Tub Shower transfers:  Uses a shower lift chair Equipment:  w/c, BSCommode, cane   IADLs: Shopping: Husband performs Light housekeeping: Max Meal Prep: MaxA Community  mobility: Relies on family Medication management:Husband assists Doctor, hospital: Performs together Handwriting: Not legible  MOBILITY STATUS: Hx of falls  ACTIVITY TOLERANCE: Activity tolerance: limited  FUNCTIONAL OUTCOME MEASURES: FOTO: 11.76  UPPER EXTREMITY ROM  : WFL Left AROM/PROM   Passive ROM Right eval Right  08/18/21  Shoulder flexion 30   Shoulder abduction 40 62  Shoulder adduction    Shoulder extension    Shoulder internal rotation    Shoulder external rotation    Elbow flexion 120   Elbow extension -67   Wrist flexion Neutral   Wrist extension 12   Wrist ulnar deviation    Wrist radial deviation    Wrist pronation    Wrist supination    (Blank rows = not tested)   UPPER EXTREMITY MMT:     MMT Right eval Left eval  Shoulder flexion 0/5 4+/5  Shoulder abduction 0/5 4+/5  Shoulder adduction    Shoulder extension    Shoulder internal rotation    Shoulder external rotation    Middle trapezius    Lower trapezius    Elbow flexion 0/5 4+/5  Elbow extension 0/5 4+/5  Wrist flexion    Wrist extension 0/5 4+/5  Wrist ulnar deviation    Wrist radial deviation    Wrist pronation    Wrist supination    (Blank rows = not tested)    SENSATION: Light touch: WFL  EDEMA:   Wrist: 18.5 cm MCP: 21.5 cm    MUSCLE TONE: RUE: Severe  COGNITION: Overall cognitive status: Impaired  VISION: Subjective report: Wears glasses Baseline vision: Wears glasses all the time Pt. Reports no changes    PRAXIS: Impaired: Motor planning     TODAY'S TREATMENT:   Pt. tolerated retrograde massage to the right hand, and digits secondary to edema. Pt. tolerated soft tissue massage to the muscles in the right scapular region. Manual therapy was perormed prior to, and in preparation for ROM, and therapeutic Ex. Pt. tolerated PROM for right wrist flexion and extension, digit flexion and extension, and thumb abduction. Pt./caregiver education was provided  about  the  right hand palm protector, edema control techniques, ROM, positioning in right shoulder. Pt. Continues to tolerate the palm protector with 1" roll without skin irritation, or breakdown in the right palm. Pt. Does have an area of redness, and irritation on the lateral aspect of the 2nd digit from the thumb nail when she is not wearing the palm protector.   Pt. presents with a red area on the lateral aspect of her right 2nd digit from the thumb nail when pt. is not wearing the palm protector secondary to tight flexor synergy. Pt.'s right shoulder flexion, and abduction, and digit extension ROM continues to be  limited by pain, however pt. was able to tolerate increased shoulder abduction, and digit extension today. Pt reports 3/10 pain.  Pt. continues to be able to tolerate slightly more abduction today following moist heat pack modality to the right shoulder, and soft tissue massage to the scapular region. Pt. presents with increased flexor tightness in the right 2nd, and 3rd digits.  Pt. continues to initially maintain the digits on her right hand in tight flexor synergy pattern. Pt. was able to tolerate retrograde massage for edema control, and ROM to the right hand, and digits. Pt. continues to work on improving pain, and improving RUE ROM in order to improve engagement with ADLs/IADLs, and promote good skin integrity through the palmar surface of the hand.   PATIENT EDUCATION: Education details: Right UE ROM, positioning of the RUE Person educated: Patient and Spouse Education method: Explanation, Demonstration, Tactile cues, and  Verbal cues Education comprehension: verbalized understanding, returned demonstration, and needs further education   HOME EXERCISE PROGRAM:   Pt./caregiver education about positioning of the RUE, ROM    GOALS: Goals reviewed with patient? Yes  SHORT TERM GOALS: Target date: 09/15/2021    Pt./caregivers with demonstrate independence with HEPs for UE ROM,  and edema control Baseline: No current HEPs Goal status: INITIAL    LONG TERM GOALS: Target date: 10/27/2021    Pt. Will improve right hand edema by 1 cm at the wrist, and MCPs Baseline: Eval: Wrist: 18.5cm, MCPs: 21.5cm Goal status: INITIAL  2.  Pt. Will improve right shoulder PROM by 10 degrees with ADLs Baseline: Eval: PROM shoulder flexion: 30 degrees, Abduction: 40 degrees Goal status: INITIAL  3.  Pt. Will improve right elbow PROM by 10 degrees to prepare for hand to mouth patterns Baseline: Eval:   elbow extension -67, flexion 120 Goal status: INITIAL  4.  Pt. Will improve PROM right wrist flexion, and extension by 10 degrees Baseline: Eval: wrist extension 12 degrees, flexion to neutral Goal status: INITIAL  5.  Pt. Will tolerate full digit extension in the right hand to promote good skin integrity through the palmar surface of the right hand  Baseline: Pt. Maintains digits in tight flexion Goal status: INITIAL  6.  Pt. Will require minA UE dressing using one armed dressing techniques. Baseline: MaxA Goal status: INITIAL  ASSESSMENT:  CLINICAL IMPRESSION:  Pt. presents with a red area on the lateral aspect of her right 2nd digit from the thumb nail when pt. is not wearing the palm protector secondary to tight flexor synergy. Pt.'s right shoulder flexion, and abduction, and digit extension ROM continues to be  limited by pain, however pt. was able to tolerate increased shoulder abduction, and digit extension today. Pt reports 3/10 pain.  Pt. continues to be able to tolerate slightly more abduction today following moist heat pack modality to the right shoulder, and soft tissue massage to the scapular region. Pt. presents with increased flexor tightness in the right 2nd, and 3rd digits.  Pt. continues to initially maintain the digits on her right hand in tight flexor synergy pattern. Pt. was able to tolerate retrograde massage for edema control, and ROM to the right hand,  and digits. Pt. continues to work on improving pain, and improving RUE ROM in order to improve engagement with ADLs/IADLs, and promote good skin integrity through the palmar surface of the hand.  PERFORMANCE DEFICITS in functional skills including ADLs, IADLs, coordination, proprioception, sensation, edema, tone, ROM, strength, pain, FMC, GMC, balance, decreased knowledge of use of DME, skin integrity, and UE functional use, cognitive skills including attention, memory, and safety awareness, and psychosocial skills including coping strategies.   IMPAIRMENTS are limiting patient from ADLs, IADLs, education, work, leisure, and social participation.   COMORBIDITIES may have co-morbidities  that affects occupational performance. Patient will benefit from skilled OT to address above impairments and improve overall function.  MODIFICATION OR ASSISTANCE TO COMPLETE EVALUATION: Maximum modification of tasks or assist with assess necessary to complete an evaluation.  OT OCCUPATIONAL PROFILE AND HISTORY: Detailed assessment: Review of records and additional review of physical, cognitive, psychosocial history related to current functional performance.  CLINICAL DECISION MAKING: High - multiple treatment options, significant modification of task necessary  REHAB POTENTIAL: Good for stated goals  EVALUATION COMPLEXITY: High    PLAN: OT FREQUENCY: 2x/week  OT DURATION: 12 weeks  PLANNED INTERVENTIONS: self care/ADL training, therapeutic exercise, therapeutic activity, neuromuscular  re-education, manual therapy, passive range of motion, paraffin, moist heat, contrast bath, patient/family education, cognitive remediation/compensation, energy conservation, and coping strategies training  RECOMMENDED OTHER SERVICES: PT, and ST  CONSULTED AND AGREED WITH PLAN OF CARE: Patient and family member/caregiver  PLAN FOR NEXT SESSION: Initiate PROM, and RUE Edema control techniques   Harrel Carina, MS,  OTR/L  Harrel Carina, OT 09/10/2021, 5:17 PM           OUTPATIENT OCCUPATIONAL THERAPY TREATMENT  Patient Name: Kelly Rollins MRN: [id number] DOB:03-25-1970, 51 y.o., female Today's Date: 09/10/2021  PCP: [name], PA-C REFERRING PROVIDER: [name], Corrine Jeani Hawking, PA-C   OT End of Session - 09/10/21 1714     Visit Number 11    Number of Visits 24    Date for OT Re-Evaluation 10/27/21    Authorization Time Period Progress reports period starting 08/04/2021    OT Start Time 1345    OT Stop Time 1430    OT Time Calculation (min) 45 min    Activity Tolerance Patient tolerated treatment well    Behavior During Therapy [facility] for tasks assessed/performed             Past Medical History:  Diagnosis Date   ABDOMINAL PAIN OTHER SPECIFIED SITE 02/21/2009   Qualifier: Diagnosis of  By: Deborra Medina MD, Talia     ABDOMINAL PAIN-LUQ 04/17/2009   Qualifier: Diagnosis of  By: Fuller Plan MD Lamont Snowball T    Anal fissure    Anginal pain ([place])    Asthma    Depression    Diabetes mellitus without complication ([place])    Dyspnea    Dysrhythmia    Headache(784.0)    HEMATOCHEZIA 04/17/2009   Qualifier: Diagnosis of  By: Fuller Plan MD Lamont Snowball T    Hyperlipidemia    Hypertension    IBS (irritable bowel syndrome)    Migraine headache 11/06/2008   Qualifier: Diagnosis of  By: Deborra Medina MD, Talia     OVARIAN CYST, RIGHT 02/21/2009   Qualifier: Diagnosis of  By: Deborra Medina MD, Talia     PALPITATIONS, OCCASIONAL 04/17/2010   Qualifier: Diagnosis of  By: Deborra Medina MD, Talia     Pancreatitis 10/14/2017   Past Surgical History:  Procedure Laterality Date   BREAST EXCISIONAL BIOPSY Left 1997   ? left side, done at time of reduction , benign   BREAST REDUCTION SURGERY     bilateral   CESAREAN SECTION     CHOLECYSTECTOMY     COLONOSCOPY     ESOPHAGOGASTRODUODENOSCOPY     ESOPHAGOGASTRODUODENOSCOPY (EGD) WITH PROPOFOL N/A 10/21/2020   Procedure: ESOPHAGOGASTRODUODENOSCOPY (EGD) WITH  PROPOFOL;  Surgeon: Lesly Rubenstein, MD;  Location: [facility] ENDOSCOPY;  Service: Endoscopy;  Laterality: N/A;   REDUCTION MAMMAPLASTY Bilateral 1997   VAGINAL HYSTERECTOMY     Patient Active Problem List   Diagnosis Date Noted   Abnormal MRI, lumbar spine (01/29/2020) 02/19/2020   Anxiety due to MRI 01/25/2020   Claustrophobia associated to MRI 01/25/2020   Enthesopathy of knee region (Right) 01/25/2020   Enthesopathy of knee region (Left) 01/25/2020   Lumbar facet hypertrophy 01/25/2020   Lumbosacral radiculopathy at S1 (Left) 01/17/2020   Lumbosacral radiculopathy (S1) (Left) 01/17/2020   Chronic lower extremity pain (Bilateral) (L>R) 01/17/2020   History of pancreatitis 07/18/2019   Obstructive sleep apnea syndrome 01/02/2019   Lumbar facet arthropathy (Multilevel) (Bilateral) 12/14/2018   Osteoarthritis involving multiple joints 11/28/2018   Chronic hip pain (Left) 11/16/2018   Acute hip pain (Left) 11/16/2018  DDD (degenerative disc disease), lumbosacral 11/16/2018   Chronic hip pain (Secondary source of pain) (Bilateral) (L>R) 05/19/2017   Elevated C-reactive protein (CRP) 05/19/2017   Elevated sed rate 05/19/2017   Spondylosis without myelopathy or radiculopathy, lumbosacral region 05/19/2017   Pharmacologic therapy 05/06/2017   Disorder of skeletal system 05/06/2017   Problems influencing health status 05/06/2017   Chest pain with moderate risk of acute coronary syndrome 04/21/2017   DOE (dyspnea on exertion) 04/21/2017   Intermittent palpitations 04/21/2017   Vitamin D insufficiency 05/04/2016   Depression 02/04/2016   Fatty liver 02/04/2016   Hypertension associated with diabetes ([place]) 02/04/2016   Chronic pain syndrome 01/15/2016   Acute low back pain 05/28/2015   Lumbar transverse process fracture (HCC) (Left L1) (03/11/15) 05/28/2015   Long term current use of opiate analgesic 05/01/2015   Long term prescription opiate use 05/01/2015   Opiate use (25 MME/Day)  05/01/2015   Encounter for therapeutic drug level monitoring 05/01/2015   Encounter for pain management planning 05/01/2015   Chronic low back pain (Primary Source of Pain) (Bilateral) (L>R) 05/01/2015   Lumbar facet syndrome (Bilateral) (L>R) 05/01/2015   Chronic knee pain [facility] source of pain) (Bilateral) (L>R) 05/01/2015   Lumbar spondylosis 05/01/2015   Neuropathic pain 05/01/2015   Neurogenic pain 05/01/2015   Myofascial pain 05/01/2015   Essential hypertriglyceridemia 04/26/2014   Type 2 diabetes mellitus with hyperglycemia, with long-term current use of insulin ([place]) 04/26/2014   Anxiety 05/15/2010   Obesity 05/15/2010   Hyperlipidemia 04/03/2010   Fatty liver disease 04/02/2010   Pure hypercholesterolemia 03/19/2010   GERD 04/17/2009   Constipation 04/17/2009   TOBACCO ABUSE 11/06/2008    ONSET DATE: 02/06/2021  REFERRING DIAG: Astrocytoma  THERAPY DIAG:  Muscle weakness (generalized)  Rationale for Evaluation and Treatment Rehabilitation  SUBJECTIVE:  Pt. Reports they are in the process of getting her medications clarified with the pharmacy, and MD office.   SUBJECTIVE STATEMENT: Pt. Reports that it is her daughter's birthday today. Pt accompanied by: significant other  PERTINENT HISTORY:  Pt. Is a 51 y.o. 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 PMHx includes: Anorexia, dehydration, Adult Failure to thrive, contracture of muscles multiple sites. Pt./caregiver reports recently being hospitalized with a UTI, and dehydration.   PRECAUTIONS: Fall  WEIGHT BEARING RESTRICTIONS No  PAIN:  Are you having pain?  Pt. Reports 3/10 pain in the right UE, and digits. Pt. intermittent facial, and verbal responses reflect increased pain levels.  FALLS: Has patient fallen in last 6 months? Yes. Number of falls Multiple Falls  LIVING ENVIRONMENT: Lives with: Family Lives in:  House/apartment Stairs: Yes: External: 4 steps; bilateral but cannot reach both Has following equipment at home: Single point cane, Walker - 4 wheeled, Hemi walker, shower chair, bed side commode, and Grab bars Lift chair in the tub, [place],  PLOF: Independent  PATIENT GOALS: To improve the right UE  OBJECTIVE:   HAND DOMINANCE: Right  ADLs: Transfers/ambulation related to ADLs: Eating: MaxA with cutting food, setting up, Modified I with nondominant Left hand, [place]  Grooming: Independent  UB Dressing: MaxA  LB Dressing: MaxA Toileting: Uses a BSCommode mInA transfers, MaxA clothing negotiation Bathing: MaxA Tub Shower transfers:  Uses a shower lift chair Equipment:  w/c, BSCommode, cane   IADLs: Shopping: Husband performs Light housekeeping: Max Meal Prep: MaxA Community mobility: Relies on family Medication management:Husband assists Doctor, hospital: Performs together Handwriting:  Not legible  MOBILITY STATUS: Hx of falls    ACTIVITY TOLERANCE: Activity tolerance: limited  FUNCTIONAL OUTCOME MEASURES: FOTO: 11.76  UPPER EXTREMITY ROM  : WFL Left AROM/PROM   Passive ROM Right eval Right  08/18/21 Right 09/10/21  Shoulder flexion 30  61  Shoulder abduction 40 62 66  Shoulder adduction     Shoulder extension     Shoulder internal rotation     Shoulder external rotation     Elbow flexion 120    Elbow extension -67    Wrist flexion Neutral    Wrist extension 12    Wrist ulnar deviation     Wrist radial deviation     Wrist pronation     Wrist supination     (Blank rows = not tested)   UPPER EXTREMITY MMT:     MMT Right eval Left eval  Shoulder flexion 0/5 4+/5  Shoulder abduction 0/5 4+/5  Shoulder adduction    Shoulder extension    Shoulder internal rotation    Shoulder external rotation    Middle trapezius    Lower trapezius    Elbow flexion 0/5 4+/5  Elbow extension 0/5 4+/5  Wrist flexion    Wrist extension 0/5 4+/5  Wrist  ulnar deviation    Wrist radial deviation    Wrist pronation    Wrist supination    (Blank rows = not tested)    SENSATION: Light touch: WFL  EDEMA:   Wrist: 18.5 cm MCP: 21.5 cm    MUSCLE TONE: RUE: Severe  COGNITION: Overall cognitive status: Impaired  VISION: Subjective report: Wears glasses Baseline vision: Wears glasses all the time Pt. Reports no changes    PRAXIS: Impaired: Motor planning     TODAY'S TREATMENT:   Pt. tolerated retrograde massage to the right hand, and digits secondary to edema. Pt. tolerated soft tissue massage to the muscles in the right scapular region. Manual therapy was perormed prior to, and in preparation for ROM, and therapeutic Ex. Pt. tolerated PROM for right wrist flexion and extension, digit flexion and extension, and thumb abduction. Pt./caregiver education was provided about  the  right hand palm protector, edema control techniques, ROM, positioning in right shoulder. Pt. Continues to tolerate the palm protector with 1" roll without skin irritation, or breakdown in the right palm. Pt. education was provided about self-ROM to the right hand/digits.  Pt. presented with increased PROM form right shoulder flexion, and abduction. Pt.'s right shoulder flexion, abduction, and digit extension ROM continues to be  limited by pain, however pt. was able to tolerate increased shoulder abduction, and digit extension today. Pt continues to report 3/10 pain.  Pt. continues to be able to tolerate slightly more abduction today following moist heat pack modality to the right shoulder, and soft tissue massage to the scapular region. Pt. presents with increased flexor tightness in the right 2nd, and 3rd digits.  Pt. continues to initially maintain the digits on her right hand in tight flexor synergy pattern. Pt. was able to tolerate retrograde massage for edema control, and ROM to the right hand, and digits. Pt. continues to work on improving pain, and improving  RUE ROM in order to improve engagement with ADLs/IADLs, and promote good skin integrity through the palmar surface of the hand.   PATIENT EDUCATION: Education details: Right UE ROM, positioning of the RUE Person educated: Patient and Spouse Education method: Explanation, Demonstration, Tactile cues, and Verbal cues Education comprehension: verbalized understanding, returned demonstration, and needs further education  HOME EXERCISE PROGRAM:   Pt./caregiver education about positioning of the RUE, ROM    GOALS: Goals reviewed with patient? Yes  SHORT TERM GOALS: Target date: 09/15/2021    Pt./caregivers with demonstrate independence with HEPs for UE ROM, and edema control Baseline: No current HEPs Goal status: INITIAL    LONG TERM GOALS: Target date: 10/27/2021    Pt. Will improve right hand edema by 1 cm at the wrist, and MCPs Baseline: Eval: Wrist: 18.5cm, MCPs: 21.5cm Goal status: INITIAL  2.  Pt. Will improve right shoulder PROM by 10 degrees with ADLs Baseline: Eval: PROM shoulder flexion: 30 degrees, Abduction: 40 degrees Goal status: INITIAL  3.  Pt. Will improve right elbow PROM by 10 degrees to prepare for hand to mouth patterns Baseline: Eval:   elbow extension -67, flexion 120 Goal status: INITIAL  4.  Pt. Will improve PROM right wrist flexion, and extension by 10 degrees Baseline: Eval: wrist extension 12 degrees, flexion to neutral Goal status: INITIAL  5.  Pt. Will tolerate full digit extension in the right hand to promote good skin integrity through the palmar surface of the right hand  Baseline: Pt. Maintains digits in tight flexion Goal status: INITIAL  6.  Pt. Will require minA UE dressing using one armed dressing techniques. Baseline: MaxA Goal status: INITIAL  ASSESSMENT:  CLINICAL IMPRESSION:  Pt. presented with increased PROM form right shoulder flexion, and abduction. Pt.'s right shoulder flexion, abduction, and digit extension ROM  continues to be  limited by pain, however pt. was able to tolerate increased shoulder abduction, and digit extension today. Pt continues to report 3/10 pain.  Pt. continues to be able to tolerate slightly more abduction today following moist heat pack modality to the right shoulder, and soft tissue massage to the scapular region. Pt. presents with increased flexor tightness in the right 2nd, and 3rd digits.  Pt. continues to initially maintain the digits on her right hand in tight flexor synergy pattern. Pt. was able to tolerate retrograde massage for edema control, and ROM to the right hand, and digits. Pt. continues to work on improving pain, and improving RUE ROM in order to improve engagement with ADLs/IADLs, and promote good skin integrity through the palmar surface of the hand.  PERFORMANCE DEFICITS in functional skills including ADLs, IADLs, coordination, proprioception, sensation, edema, tone, ROM, strength, pain, FMC, GMC, balance, decreased knowledge of use of DME, skin integrity, and UE functional use, cognitive skills including attention, memory, and safety awareness, and psychosocial skills including coping strategies.   IMPAIRMENTS are limiting patient from ADLs, IADLs, education, work, leisure, and social participation.   COMORBIDITIES may have co-morbidities  that affects occupational performance. Patient will benefit from skilled OT to address above impairments and improve overall function.  MODIFICATION OR ASSISTANCE TO COMPLETE EVALUATION: Maximum modification of tasks or assist with assess necessary to complete an evaluation.  OT OCCUPATIONAL PROFILE AND HISTORY: Detailed assessment: Review of records and additional review of physical, cognitive, psychosocial history related to current functional performance.  CLINICAL DECISION MAKING: High - multiple treatment options, significant modification of task necessary  REHAB POTENTIAL: Good for stated goals  EVALUATION COMPLEXITY:  High    PLAN: OT FREQUENCY: 2x/week  OT DURATION: 12 weeks  PLANNED INTERVENTIONS: self care/ADL training, therapeutic exercise, therapeutic activity, neuromuscular re-education, manual therapy, passive range of motion, paraffin, moist heat, contrast bath, patient/family education, cognitive remediation/compensation, energy conservation, and coping strategies training  RECOMMENDED OTHER SERVICES: PT, and ST  CONSULTED AND AGREED WITH  PLAN OF CARE: Patient and family member/caregiver  PLAN FOR NEXT SESSION: Initiate PROM, and RUE Edema control techniques   Harrel Carina, MS, OTR/L  Harrel Carina, OT 09/10/2021, 5:17 PM

## 2021-09-11 NOTE — Therapy (Signed)
OUTPATIENT SPEECH LANGUAGE PATHOLOGY TREATMENT NOTE   Patient Name: Kelly Rollins MRN: [id number] DOB:1970-08-12, 51 y.o., female Today's Date: 09/11/2021  PCP: Grayland Ormond, PA REFERRING PROVIDER: Margarita Grizzle, NP  END OF SESSION:   End of Session - 09/11/21 1228     Visit Number 5    Number of Visits 25    Date for SLP Re-Evaluation 11/13/21    Authorization Type [employer]    Progress Note Due on Visit 10    SLP Start Time 1435    SLP Stop Time  1530    SLP Time Calculation (min) 55 min    Activity Tolerance Patient tolerated treatment well             Past Medical History:  Diagnosis Date   ABDOMINAL PAIN OTHER SPECIFIED SITE 02/21/2009   Qualifier: Diagnosis of  By: Deborra Medina MD, Talia     ABDOMINAL PAIN-LUQ 04/17/2009   Qualifier: Diagnosis of  By: Fuller Plan MD Lamont Snowball T    Anal fissure    Anginal pain ([place])    Asthma    Depression    Diabetes mellitus without complication ([place])    Dyspnea    Dysrhythmia    Headache(784.0)    HEMATOCHEZIA 04/17/2009   Qualifier: Diagnosis of  By: Fuller Plan MD Lamont Snowball T    Hyperlipidemia    Hypertension    IBS (irritable bowel syndrome)    Migraine headache 11/06/2008   Qualifier: Diagnosis of  By: Deborra Medina MD, Talia     OVARIAN CYST, RIGHT 02/21/2009   Qualifier: Diagnosis of  By: Deborra Medina MD, Talia     PALPITATIONS, OCCASIONAL 04/17/2010   Qualifier: Diagnosis of  By: Deborra Medina MD, Talia     Pancreatitis 10/14/2017   Past Surgical History:  Procedure Laterality Date   BREAST EXCISIONAL BIOPSY Left 1997   ? left side, done at time of reduction , benign   BREAST REDUCTION SURGERY     bilateral   CESAREAN SECTION     CHOLECYSTECTOMY     COLONOSCOPY     ESOPHAGOGASTRODUODENOSCOPY     ESOPHAGOGASTRODUODENOSCOPY (EGD) WITH PROPOFOL N/A 10/21/2020   Procedure: ESOPHAGOGASTRODUODENOSCOPY (EGD) WITH PROPOFOL;  Surgeon: Lesly Rubenstein, MD;  Location: [facility] ENDOSCOPY;  Service: Endoscopy;  Laterality:  N/A;   REDUCTION MAMMAPLASTY Bilateral 1997   VAGINAL HYSTERECTOMY     Patient Active Problem List   Diagnosis Date Noted   Abnormal MRI, lumbar spine (01/29/2020) 02/19/2020   Anxiety due to MRI 01/25/2020   Claustrophobia associated to MRI 01/25/2020   Enthesopathy of knee region (Right) 01/25/2020   Enthesopathy of knee region (Left) 01/25/2020   Lumbar facet hypertrophy 01/25/2020   Lumbosacral radiculopathy at S1 (Left) 01/17/2020   Lumbosacral radiculopathy (S1) (Left) 01/17/2020   Chronic lower extremity pain (Bilateral) (L>R) 01/17/2020   History of pancreatitis 07/18/2019   Obstructive sleep apnea syndrome 01/02/2019   Lumbar facet arthropathy (Multilevel) (Bilateral) 12/14/2018   Osteoarthritis involving multiple joints 11/28/2018   Chronic hip pain (Left) 11/16/2018   Acute hip pain (Left) 11/16/2018   DDD (degenerative disc disease), lumbosacral 11/16/2018   Chronic hip pain (Secondary source of pain) (Bilateral) (L>R) 05/19/2017   Elevated C-reactive protein (CRP) 05/19/2017   Elevated sed rate 05/19/2017   Spondylosis without myelopathy or radiculopathy, lumbosacral region 05/19/2017   Pharmacologic therapy 05/06/2017   Disorder of skeletal system 05/06/2017   Problems influencing health status 05/06/2017   Chest pain with moderate risk of acute coronary syndrome 04/21/2017   DOE (  dyspnea on exertion) 04/21/2017   Intermittent palpitations 04/21/2017   Vitamin D insufficiency 05/04/2016   Depression 02/04/2016   Fatty liver 02/04/2016   Hypertension associated with diabetes ([place]) 02/04/2016   Chronic pain syndrome 01/15/2016   Acute low back pain 05/28/2015   Lumbar transverse process fracture (HCC) (Left L1) (03/11/15) 05/28/2015   Long term current use of opiate analgesic 05/01/2015   Long term prescription opiate use 05/01/2015   Opiate use (25 MME/Day) 05/01/2015   Encounter for therapeutic drug level monitoring 05/01/2015   Encounter for pain management  planning 05/01/2015   Chronic low back pain (Primary Source of Pain) (Bilateral) (L>R) 05/01/2015   Lumbar facet syndrome (Bilateral) (L>R) 05/01/2015   Chronic knee pain [facility] source of pain) (Bilateral) (L>R) 05/01/2015   Lumbar spondylosis 05/01/2015   Neuropathic pain 05/01/2015   Neurogenic pain 05/01/2015   Myofascial pain 05/01/2015   Essential hypertriglyceridemia 04/26/2014   Type 2 diabetes mellitus with hyperglycemia, with long-term current use of insulin ([place]) 04/26/2014   Anxiety 05/15/2010   Obesity 05/15/2010   Hyperlipidemia 04/03/2010   Fatty liver disease 04/02/2010   Pure hypercholesterolemia 03/19/2010   GERD 04/17/2009   Constipation 04/17/2009   TOBACCO ABUSE 11/06/2008    ONSET DATE: 02/14/2021 date of initial dx; 08/19/2021 date of referral      REFERRING DIAG: R47.01 (ICD-10-CM) - Aphasia   PERTINENT HISTORY: PMH T2DM and 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 Colin Mulders in 02/14/21 s/p radiation and TMZ.    DIAGNOSTIC FINDINGS: MRI 07/10/2021 Within limitations of motion artifact, no significant difference in left frontal based mass crossing midline through the corpus callosum. On conventional T1 postcontrast imaging, the previous areas of hyper enhancement appear to have diminished some degree. No definite areas of new involvement. Redemonstrated left frontal stereotactic biopsy tract.   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  Yes, monitored with repositioning  PATIENT GOALS: to be able to speak fluently  OBJECTIVE:   TODAY'S TREATMENT: Skilled treatment session focused on facilitation of pt's feelings by providing maximal verbal cues and questions. With this level of  support, pt able to communicate her current emotional state regarding tumor dx and recovery.    PATIENT EDUCATION: Education details: tasks to facilitate word finding Person educated: Patient and Spouse Education method: Explanation, Demonstration, and Verbal cues Education comprehension: verbalized understanding and needs further education  GOALS: Goals reviewed with patient? Yes  SHORT TERM GOALS: Target date: 10 sessions   Given basic category, pt will generate at least 6 members in 7 out of 10 opportunities with minimal cues.  Baseline: 3 members in basic category Goal status: INITIAL   2.  Pt will demonstrate use of at least 1 word finding strategy to aid in communication breakdowns across 5 sessions with minimal cues.  Baseline: introduced concept 08/20/2021 Goal status: INITIAL   3.  Pt will expressively produce 4+ word sentence in response to situation/picture with 70% accuracy given minimal cues in order to increase ability to communicate basic wants needs. Baseline: 3 word Goal status: INITIAL     LONG TERM GOALS: Target date: 11/12/2021   Pt and caregiver will utilize word finding strategies to communicate pt's basic wants/needs in 5 out of 10 opportunities.  Baseline: introduced  08/20/2021 Goal status: INITIAL    ASSESSMENT:  CLINICAL IMPRESSION: Communication burden continues to lay within pt's communication partners.   OBJECTIVE IMPAIRMENTS include attention, expressive language, and aphasia. These impairments are limiting patient from managing medications, managing appointments, managing finances, household responsibilities, ADLs/IADLs, and effectively communicating at home and in community. Factors affecting potential to achieve goals and functional outcome are  astrocytoma . Patient will benefit from skilled SLP services to address above impairments and improve overall function.  REHAB POTENTIAL: Fair ; astrocytomia  PLAN: SLP FREQUENCY: 1-2x/week  SLP  DURATION: 12 weeks  PLANNED INTERVENTIONS: Language facilitation, Functional tasks, SLP instruction and feedback, and Patient/family education  Devota Viruet B. Rutherford Nail, M.S., CCC-SLP, Mining engineer Certified [facility] [place]  [place] Office [phone] Ascom [phone] Fax [phone]

## 2021-09-15 ENCOUNTER — Encounter: Payer: Medicare Other | Admitting: Occupational Therapy

## 2021-09-16 ENCOUNTER — Ambulatory Visit: Payer: Medicare Other

## 2021-09-16 ENCOUNTER — Ambulatory Visit: Payer: Medicare Other | Admitting: Speech Pathology

## 2021-09-16 ENCOUNTER — Ambulatory Visit: Payer: Medicare Other | Admitting: Occupational Therapy

## 2021-09-16 DIAGNOSIS — R4701 Aphasia: Secondary | ICD-10-CM

## 2021-09-16 DIAGNOSIS — C719 Malignant neoplasm of brain, unspecified: Secondary | ICD-10-CM

## 2021-09-16 DIAGNOSIS — R262 Difficulty in walking, not elsewhere classified: Secondary | ICD-10-CM

## 2021-09-16 DIAGNOSIS — M6281 Muscle weakness (generalized): Secondary | ICD-10-CM

## 2021-09-16 DIAGNOSIS — R278 Other lack of coordination: Secondary | ICD-10-CM

## 2021-09-16 DIAGNOSIS — R2681 Unsteadiness on feet: Secondary | ICD-10-CM

## 2021-09-16 NOTE — Therapy (Signed)
OUTPATIENT SPEECH LANGUAGE PATHOLOGY TREATMENT NOTE   Patient Name: Kelly Rollins MRN: [id number] DOB:August 10, 1970, 51 y.o., female Today's Date: 09/16/2021  PCP: Grayland Ormond, PA REFERRING PROVIDER: Margarita Grizzle, NP  END OF SESSION:   End of Session - 09/16/21 1358     Visit Number 6    Number of Visits 25    Date for SLP Re-Evaluation 11/13/21    Authorization Type [employer]    Progress Note Due on Visit 10    SLP Start Time 1400    SLP Stop Time  1500    SLP Time Calculation (min) 60 min    Activity Tolerance Patient tolerated treatment well             Past Medical History:  Diagnosis Date   ABDOMINAL PAIN OTHER SPECIFIED SITE 02/21/2009   Qualifier: Diagnosis of  By: Deborra Medina MD, Talia     ABDOMINAL PAIN-LUQ 04/17/2009   Qualifier: Diagnosis of  By: Fuller Plan MD Lamont Snowball T    Anal fissure    Anginal pain ([place])    Asthma    Depression    Diabetes mellitus without complication ([place])    Dyspnea    Dysrhythmia    Headache(784.0)    HEMATOCHEZIA 04/17/2009   Qualifier: Diagnosis of  By: Fuller Plan MD Lamont Snowball T    Hyperlipidemia    Hypertension    IBS (irritable bowel syndrome)    Migraine headache 11/06/2008   Qualifier: Diagnosis of  By: Deborra Medina MD, Talia     OVARIAN CYST, RIGHT 02/21/2009   Qualifier: Diagnosis of  By: Deborra Medina MD, Talia     PALPITATIONS, OCCASIONAL 04/17/2010   Qualifier: Diagnosis of  By: Deborra Medina MD, Talia     Pancreatitis 10/14/2017   Past Surgical History:  Procedure Laterality Date   BREAST EXCISIONAL BIOPSY Left 1997   ? left side, done at time of reduction , benign   BREAST REDUCTION SURGERY     bilateral   CESAREAN SECTION     CHOLECYSTECTOMY     COLONOSCOPY     ESOPHAGOGASTRODUODENOSCOPY     ESOPHAGOGASTRODUODENOSCOPY (EGD) WITH PROPOFOL N/A 10/21/2020   Procedure: ESOPHAGOGASTRODUODENOSCOPY (EGD) WITH PROPOFOL;  Surgeon: Lesly Rubenstein, MD;  Location: [facility] ENDOSCOPY;  Service: Endoscopy;  Laterality:  N/A;   REDUCTION MAMMAPLASTY Bilateral 1997   VAGINAL HYSTERECTOMY     Patient Active Problem List   Diagnosis Date Noted   Abnormal MRI, lumbar spine (01/29/2020) 02/19/2020   Anxiety due to MRI 01/25/2020   Claustrophobia associated to MRI 01/25/2020   Enthesopathy of knee region (Right) 01/25/2020   Enthesopathy of knee region (Left) 01/25/2020   Lumbar facet hypertrophy 01/25/2020   Lumbosacral radiculopathy at S1 (Left) 01/17/2020   Lumbosacral radiculopathy (S1) (Left) 01/17/2020   Chronic lower extremity pain (Bilateral) (L>R) 01/17/2020   History of pancreatitis 07/18/2019   Obstructive sleep apnea syndrome 01/02/2019   Lumbar facet arthropathy (Multilevel) (Bilateral) 12/14/2018   Osteoarthritis involving multiple joints 11/28/2018   Chronic hip pain (Left) 11/16/2018   Acute hip pain (Left) 11/16/2018   DDD (degenerative disc disease), lumbosacral 11/16/2018   Chronic hip pain (Secondary source of pain) (Bilateral) (L>R) 05/19/2017   Elevated C-reactive protein (CRP) 05/19/2017   Elevated sed rate 05/19/2017   Spondylosis without myelopathy or radiculopathy, lumbosacral region 05/19/2017   Pharmacologic therapy 05/06/2017   Disorder of skeletal system 05/06/2017   Problems influencing health status 05/06/2017   Chest pain with moderate risk of acute coronary syndrome 04/21/2017   DOE (  dyspnea on exertion) 04/21/2017   Intermittent palpitations 04/21/2017   Vitamin D insufficiency 05/04/2016   Depression 02/04/2016   Fatty liver 02/04/2016   Hypertension associated with diabetes ([place]) 02/04/2016   Chronic pain syndrome 01/15/2016   Acute low back pain 05/28/2015   Lumbar transverse process fracture (HCC) (Left L1) (03/11/15) 05/28/2015   Long term current use of opiate analgesic 05/01/2015   Long term prescription opiate use 05/01/2015   Opiate use (25 MME/Day) 05/01/2015   Encounter for therapeutic drug level monitoring 05/01/2015   Encounter for pain management  planning 05/01/2015   Chronic low back pain (Primary Source of Pain) (Bilateral) (L>R) 05/01/2015   Lumbar facet syndrome (Bilateral) (L>R) 05/01/2015   Chronic knee pain [facility] source of pain) (Bilateral) (L>R) 05/01/2015   Lumbar spondylosis 05/01/2015   Neuropathic pain 05/01/2015   Neurogenic pain 05/01/2015   Myofascial pain 05/01/2015   Essential hypertriglyceridemia 04/26/2014   Type 2 diabetes mellitus with hyperglycemia, with long-term current use of insulin ([place]) 04/26/2014   Anxiety 05/15/2010   Obesity 05/15/2010   Hyperlipidemia 04/03/2010   Fatty liver disease 04/02/2010   Pure hypercholesterolemia 03/19/2010   GERD 04/17/2009   Constipation 04/17/2009   TOBACCO ABUSE 11/06/2008    ONSET DATE: 02/14/2021 date of initial dx; 08/19/2021 date of referral      REFERRING DIAG: R47.01 (ICD-10-CM) - Aphasia   PERTINENT HISTORY: PMH T2DM and 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 Colin Mulders in 02/14/21 s/p radiation and TMZ.    DIAGNOSTIC FINDINGS: MRI 07/10/2021 Within limitations of motion artifact, no significant difference in left frontal based mass crossing midline through the corpus callosum. On conventional T1 postcontrast imaging, the previous areas of hyper enhancement appear to have diminished some degree. No definite areas of new involvement. Redemonstrated left frontal stereotactic biopsy tract.   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  Yes, monitored with repositioning  PATIENT GOALS: to be able to speak fluently  OBJECTIVE:   TODAY'S TREATMENT: Skilled treatment session focused on facilitation of pt's feelings by providing maximal verbal cues and questions. With this level of  support, pt able to communicate her current emotional state regarding tumor dx and recovery.    PATIENT EDUCATION: Education details: tasks to facilitate word finding Person educated: Patient and Spouse Education method: Explanation, Demonstration, and Verbal cues Education comprehension: verbalized understanding and needs further education  GOALS: Goals reviewed with patient? Yes  SHORT TERM GOALS: Target date: 10 sessions   Given basic category, pt will generate at least 6 members in 7 out of 10 opportunities with minimal cues.  Baseline: 3 members in basic category Goal status: INITIAL   2.  Pt will demonstrate use of at least 1 word finding strategy to aid in communication breakdowns across 5 sessions with minimal cues.  Baseline: introduced concept 08/20/2021 Goal status: INITIAL   3.  Pt will expressively produce 4+ word sentence in response to situation/picture with 70% accuracy given minimal cues in order to increase ability to communicate basic wants needs. Baseline: 3 word Goal status: INITIAL     LONG TERM GOALS: Target date: 11/12/2021   Pt and caregiver will utilize word finding strategies to communicate pt's basic wants/needs in 5 out of 10 opportunities.  Baseline: introduced  08/20/2021 Goal status: INITIAL    ASSESSMENT:  CLINICAL IMPRESSION: Communication burden continues to lay within pt's communication partners.   OBJECTIVE IMPAIRMENTS include attention, expressive language, and aphasia. These impairments are limiting patient from managing medications, managing appointments, managing finances, household responsibilities, ADLs/IADLs, and effectively communicating at home and in community. Factors affecting potential to achieve goals and functional outcome are  astrocytoma . Patient will benefit from skilled SLP services to address above impairments and improve overall function.  REHAB POTENTIAL: Fair ; astrocytomia  PLAN: SLP FREQUENCY: 1-2x/week  SLP  DURATION: 12 weeks  PLANNED INTERVENTIONS: Language facilitation, Functional tasks, SLP instruction and feedback, and Patient/family education  Forrestine Lecrone B. Rutherford Nail, M.S., CCC-SLP, Mining engineer Certified Brain [facility] [place]  [place] Office [phone] Ascom [phone] Fax [phone]

## 2021-09-16 NOTE — Therapy (Signed)
OUTPATIENT PHYSICAL THERAPY NEURO EVALUATION   Patient Name: Kelly Rollins MRN: [id number] DOB:12-30-1970, 51 y.o., female Today's Date: 09/17/2021   PCP: [name], PA-C REFERRING PROVIDER: [name], Bonnell Public, PA-C   PT End of Session - 09/17/21 0908     Visit Number 1    Number of Visits 25    Date for PT Re-Evaluation 12/09/21    PT Start Time 1603    PT Stop Time 1655    PT Time Calculation (min) 52 min    Equipment Utilized During Treatment Gait belt    Activity Tolerance Patient tolerated treatment well    Behavior During Therapy WFL for tasks assessed/performed             Past Medical History:  Diagnosis Date   ABDOMINAL PAIN OTHER SPECIFIED SITE 02/21/2009   Qualifier: Diagnosis of  By: Deborra Medina MD, Talia     ABDOMINAL PAIN-LUQ 04/17/2009   Qualifier: Diagnosis of  By: Fuller Plan MD Lamont Snowball T    Anal fissure    Anginal pain ([place])    Asthma    Depression    Diabetes mellitus without complication ([place])    Dyspnea    Dysrhythmia    Headache(784.0)    HEMATOCHEZIA 04/17/2009   Qualifier: Diagnosis of  By: Fuller Plan MD Lamont Snowball T    Hyperlipidemia    Hypertension    IBS (irritable bowel syndrome)    Migraine headache 11/06/2008   Qualifier: Diagnosis of  By: Deborra Medina MD, Talia     OVARIAN CYST, RIGHT 02/21/2009   Qualifier: Diagnosis of  By: Deborra Medina MD, Talia     PALPITATIONS, OCCASIONAL 04/17/2010   Qualifier: Diagnosis of  By: Deborra Medina MD, Talia     Pancreatitis 10/14/2017   Past Surgical History:  Procedure Laterality Date   BREAST EXCISIONAL BIOPSY Left 1997   ? left side, done at time of reduction , benign   BREAST REDUCTION SURGERY     bilateral   CESAREAN SECTION     CHOLECYSTECTOMY     COLONOSCOPY     ESOPHAGOGASTRODUODENOSCOPY     ESOPHAGOGASTRODUODENOSCOPY (EGD) WITH PROPOFOL N/A 10/21/2020   Procedure: ESOPHAGOGASTRODUODENOSCOPY (EGD) WITH PROPOFOL;  Surgeon: Lesly Rubenstein, MD;  Location: [facility] ENDOSCOPY;  Service:  Endoscopy;  Laterality: N/A;   REDUCTION MAMMAPLASTY Bilateral 1997   VAGINAL HYSTERECTOMY     Patient Active Problem List   Diagnosis Date Noted   Abnormal MRI, lumbar spine (01/29/2020) 02/19/2020   Anxiety due to MRI 01/25/2020   Claustrophobia associated to MRI 01/25/2020   Enthesopathy of knee region (Right) 01/25/2020   Enthesopathy of knee region (Left) 01/25/2020   Lumbar facet hypertrophy 01/25/2020   Lumbosacral radiculopathy at S1 (Left) 01/17/2020   Lumbosacral radiculopathy (S1) (Left) 01/17/2020   Chronic lower extremity pain (Bilateral) (L>R) 01/17/2020   History of pancreatitis 07/18/2019   Obstructive sleep apnea syndrome 01/02/2019   Lumbar facet arthropathy (Multilevel) (Bilateral) 12/14/2018   Osteoarthritis involving multiple joints 11/28/2018   Chronic hip pain (Left) 11/16/2018   Acute hip pain (Left) 11/16/2018   DDD (degenerative disc disease), lumbosacral 11/16/2018   Chronic hip pain (Secondary source of pain) (Bilateral) (L>R) 05/19/2017   Elevated C-reactive protein (CRP) 05/19/2017   Elevated sed rate 05/19/2017   Spondylosis without myelopathy or radiculopathy, lumbosacral region 05/19/2017   Pharmacologic therapy 05/06/2017   Disorder of skeletal system 05/06/2017   Problems influencing health status 05/06/2017   Chest pain with moderate risk of acute coronary syndrome 04/21/2017  DOE (dyspnea on exertion) 04/21/2017   Intermittent palpitations 04/21/2017   Vitamin D insufficiency 05/04/2016   Depression 02/04/2016   Fatty liver 02/04/2016   Hypertension associated with diabetes ([place]) 02/04/2016   Chronic pain syndrome 01/15/2016   Acute low back pain 05/28/2015   Lumbar transverse process fracture (HCC) (Left L1) (03/11/15) 05/28/2015   Long term current use of opiate analgesic 05/01/2015   Long term prescription opiate use 05/01/2015   Opiate use (25 MME/Day) 05/01/2015   Encounter for therapeutic drug level monitoring 05/01/2015    Encounter for pain management planning 05/01/2015   Chronic low back pain (Primary Source of Pain) (Bilateral) (L>R) 05/01/2015   Lumbar facet syndrome (Bilateral) (L>R) 05/01/2015   Chronic knee pain [facility] source of pain) (Bilateral) (L>R) 05/01/2015   Lumbar spondylosis 05/01/2015   Neuropathic pain 05/01/2015   Neurogenic pain 05/01/2015   Myofascial pain 05/01/2015   Essential hypertriglyceridemia 04/26/2014   Type 2 diabetes mellitus with hyperglycemia, with long-term current use of insulin ([place]) 04/26/2014   Anxiety 05/15/2010   Obesity 05/15/2010   Hyperlipidemia 04/03/2010   Fatty liver disease 04/02/2010   Pure hypercholesterolemia 03/19/2010   GERD 04/17/2009   Constipation 04/17/2009   TOBACCO ABUSE 11/06/2008    ONSET DATE: January 2023  REFERRING DIAG: C71.9 (ICD-10-CM) - Malignant neoplasm of brain, unspecified  THERAPY DIAG:  Muscle weakness (generalized)  Unsteadiness on feet  Difficulty in walking, not elsewhere classified  Other lack of coordination  Rationale for Evaluation and Treatment Rehabilitation  SUBJECTIVE:                                                                                                                                                                                              SUBJECTIVE STATEMENT: Pt spouse present for evaluation. Pt in manual WC being propelled by spouse. Pt has lift chair as she is unable to independently stand from a standard chair.   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 Pt's husband, [name], present with pt. [name] provides majority of hx as pt has difficulty with speech due to Broca's aphasia (working with SLP). Pt reports partial paralysis of R side. She is unable to move her RUE, RLE also affected. Pt ambulates with hemiwalker in their home  for short distances and does not use WC at home. She fatigues quickly with ambulation. Pt has AFO for RLE but unable to determine how to donn it without it hurting her LE. Pt currently with wound  on R calf covered by large bandage that they have been treating for at least a month (reports physician aware). In addition to wound on R calf, pt reports R side skin irritation under breast and lateral trunk due to bra underwire. They have since removed underwire. Pt was hospitalized 10 days in June due to UTI. She has had at least 12 falls in the last six months. Reports no injuries with falls, and states "just bumps and bruises." Pt reports she has constant head pain. Pt spouse reports pt has pain throughout her whole right side down to LE. Can have bad cramps in RUE and RLE. Reports no numbness/tingling. Occ dizziness, unable to describe further. Pt sleeps in recliner, wakes up stiff in the morning. PMH per chart includes: Anorexia, dehydration, Adult Failure to thrive, contracture of muscles multiple sites. Pt./caregiver reports recently being hospitalized with a UTI, and dehydration. Extensive hx, please refer to chart for full details.   PAIN:  Are you having pain? Yes: NPRS scale: not rated/10 Pain location: Top of head, front Pain description: States, "cancer pain" head pain Aggravating factors: unsure Relieving factors: medications  PRECAUTIONS: Fall  WEIGHT BEARING RESTRICTIONS No  FALLS: Has patient fallen in last 6 months? Yes. Number of falls 12  LIVING ENVIRONMENT: information primarily from chart Lives with: lives with their spouse Lives in: House/apartment Stairs: Yes: External: 4 steps; bilateral but cannot reach both Has following equipment at home: Single point cane, Walker - 4 wheeled, Hemi walker, shower chair, bed side commode, Grab bars, and lift chair  PLOF: Independent  PATIENT GOALS  "Normal function" as much as possible. Improving her balance.  OBJECTIVE:   DIAGNOSTIC  FINDINGS:   MR BRAIN W WO CONTRAST 02/06/2021:  "IMPRESSION: Masslike T2 signal in the anterior left frontal lobe, without associated enhancement, most concerning for a low-grade glioma. Additional areas of increased T2 signal in the left frontal lobe may also infiltrative low-grade glioma or associated edema."  CT HEAD WO CONTRAST (5MM) 02/06/2021: "IMPRESSION: High left anterior frontal edema with new adjacent masslike hyperdensity involving the left frontal white matter, high left left frontal cortex, and genu of corpus callosum. Overall, findings are suspicious for malignancy (including hypercellular tumors such as GBM and lymphoma). Acute or subacute left ACA territory infarct is a differential consideration, but thought less likely. Recommend MRI with contrast for further evaluation."   COGNITION: Overall cognitive status: Within functional limits for tasks assessed   SENSATION: Impaired in RLE - diminished and absent throughout R ankle/foot - noted wound on calf - LLE intact to light touch   COORDINATION:  Finger to chin with LUE - few errors otherwise able to complete. Unable to perform RUE.  EDEMA: noted B ankles   MUSCLE TONE: RLE: Mild-moderate. Pt unable to get R heel on floor in weightbearing position, plantarflexed   POSTURE: rounded shoulders, forward head, increased thoracic kyphosis, and flexed trunk   LOWER EXTREMITY MMT:     LLE grossly 4/5 throughout Trace activation RLE in chair with majority of muscles tested. RLE generally <3/5. Exception R hip abductors 3-/5, would benefit from further assessment in supine.   (Blank rows = not tested)  LOWER EXTREMITY MMT:   Impaired throughout RLE due to tone   TRANSFERS: Assistive device utilized: Hemi walker  Sit to stand:  Attempted 5xSTS in session, unable to complete but did perform 2x in session with mod a.  Spouse reports pt able to perform transfers with 20% help at home from chair of  increased  height Stand to sit: Mod Ax2   GAIT: Gait pattern: step to pattern, RLE fixed in PF, externally rotated, uses L hemiwalker Distance walked: 20 ft Assistive device utilized: Hemi walker Level of assistance:  close CGA Comments: heavy LUE weightbearing on HW  FUNCTIONAL TESTs:  5 times sit to stand: unable to complete from standard chair height, performs 1 STS  PATIENT SURVEYS:  FOTO unable to complete due to error indicating FOTO not available in system  TODAY'S TREATMENT:   Instructed pt in spouse in aggressive pressure-relief over current wound on R calf (pt has covered with bandage), do not use R AFO until sufficiently healed due to pressure from brace, and utilize pillows to off-weight the area when pt in recliner. Instructed to monitor wound and follow-up with her physician about it. Instructed to seek immediate care if feverish, area turns red and hot to touch, has odor. Pt also reports areas of skin irritation/possible wound development on R side where underwire bra sat. They have since removed underwire. Instruction in monitoring with current bra without underwire and to go without bra should irritation continue to reduce pressure to area. Also instructed to follow-up with her physician regarding these sites. Provided further education on signs and symptoms to look out for for possible wound development in other areas, skin check R foot.    PATIENT EDUCATION: Education details: wound care, pressure relief, to check RLE ankle/foot each day throughout day due to impaired sensation, assessment findings Person educated: pt and her spouse Education method:Explanation, Demonstration, Tactile cues, and Verbal cues Education comprehension: verbalized understanding and needs further education   HOME EXERCISE PROGRAM: To be initiated next 1-2 visits     GOALS: Goals reviewed with patient? Yes  SHORT TERM GOALS: Target date: 10/28/2021  Patient will be independent in home exercise  program to improve strength/mobility for better functional independence with ADLs. Baseline: to be initiated next 1-2 sessions Goal status: INITIAL   LONG TERM GOALS: Target date: 12/09/2021  Patient will increase FOTO score to equal to or greater than     to demonstrate statistically significant improvement in mobility and quality of life.  Baseline: to be initiated next 1-2 visits Goal status: INITIAL  2.   Patient (< 27 years old) will be able to perform and complete five times sit to stand test from standard chair height in <30 seconds to indicate increased LE strength and improved balance. Baseline: Pt able to complete only 1 stand, insufficient strength currently to complete full test Goal status: INITIAL  3.  Pt will improve LLE strength by at least 1/2 point on MMT in order to increase ease with transfers and functional mobility. Baseline: LLE grossly 4/5 throughout Goal status: INITIAL  4.  Patient will increase 10 meter walk test to at least 0.80 m/s as to improve gait speed for better community ambulation and to reduce fall risk. Baseline: to be tested next 1-2 visits Goal status: INITIAL  5.  Pt will complete STS from standard height chair with BUE assist off armrests and with no greater than min assist to improve ease and safety with transfers. Baseline: mod a x2 Goal status: INITIAL   ASSESSMENT:  CLINICAL IMPRESSION: Patient is a pleasant 51 y.o. female who was seen today for physical therapy evaluation following diagnosis of brain tumor in January 2023 with a hx of frequent falls. Examination indicates impairments in balance, strength, ROM, gait, sensation and pain. These impairments are affecting pt's ability to perform transfers, ADLs,  with safety and ease, further increasing her risk of future falls. The pt would benefit from further skilled PT to address these deficits in order to decrease fall risk and increase functional mobility and QOL.   OBJECTIVE IMPAIRMENTS  Abnormal gait, decreased activity tolerance, decreased balance, decreased coordination, decreased endurance, decreased mobility, difficulty walking, decreased ROM, decreased strength, hypomobility, increased edema, increased fascial restrictions, increased muscle spasms, impaired flexibility, impaired sensation, impaired tone, impaired UE functional use, improper body mechanics, postural dysfunction, and pain.   ACTIVITY LIMITATIONS carrying, lifting, bending, sitting, standing, squatting, stairs, transfers, bed mobility, bathing, toileting, dressing, reach over head, hygiene/grooming, locomotion level, and caring for others  PARTICIPATION LIMITATIONS: meal prep, cleaning, laundry, medication management, personal finances, driving, shopping, community activity, occupation, and yard work  PERSONAL Education officer, community, Past/current experiences, Sex, and 3+ comorbidities: Anorexia, dehydration, Adult Failure to thrive, contracture of muscles multiple sites, multiple falls, UTI, extensive PMH refer to chart  are also affecting patient's functional outcome.   REHAB POTENTIAL: Fair    CLINICAL DECISION MAKING: Evolving/moderate complexity  EVALUATION COMPLEXITY: High  PLAN: PT FREQUENCY: 2x/week  PT DURATION: 12 weeks  PLANNED INTERVENTIONS: Therapeutic exercises, Therapeutic activity, Neuromuscular re-education, Balance training, Gait training, Patient/Family education, Self Care, Joint mobilization, Joint manipulation, Stair training, Vestibular training, Canalith repositioning, Orthotic/Fit training, DME instructions, Wheelchair mobility training, Spinal mobilization, Cryotherapy, Moist heat, scar mobilization, Splintting, Taping, Manual therapy, and Re-evaluation  PLAN FOR NEXT SESSION: complete assessment, initiate stretching, strengthening, gait, balance   Zollie Pee, PT 09/17/2021, 4:37 PM

## 2021-09-17 NOTE — Therapy (Addendum)
OUTPATIENT OCCUPATIONAL THERAPY TREATMENT  Patient Name: Kelly Rollins MRN: [id number] DOB:[birth year], 51 y.o., female Today's Date: 09/17/2021  PCP: [name], PA-C REFERRING PROVIDER: [name], Corrine Jeani Hawking, PA-C   OT End of Session - 09/17/21 0856     Visit Number 12    Number of Visits 24    Date for OT Re-Evaluation 10/27/21    Authorization Time Period Progress reports period starting 08/04/2021    OT Start Time 1515    OT Stop Time 1600    OT Time Calculation (min) 45 min    Activity Tolerance Patient tolerated treatment well    Behavior During Therapy [facility] for tasks assessed/performed             Past Medical History:  Diagnosis Date   ABDOMINAL PAIN OTHER SPECIFIED SITE 02/21/2009   Qualifier: Diagnosis of  By: Deborra Medina MD, Talia     ABDOMINAL PAIN-LUQ 04/17/2009   Qualifier: Diagnosis of  By: Fuller Plan MD Lamont Snowball T    Anal fissure    Anginal pain ([place])    Asthma    Depression    Diabetes mellitus without complication ([place])    Dyspnea    Dysrhythmia    Headache(784.0)    HEMATOCHEZIA 04/17/2009   Qualifier: Diagnosis of  By: Fuller Plan MD Lamont Snowball T    Hyperlipidemia    Hypertension    IBS (irritable bowel syndrome)    Migraine headache 11/06/2008   Qualifier: Diagnosis of  By: Deborra Medina MD, Talia     OVARIAN CYST, RIGHT 02/21/2009   Qualifier: Diagnosis of  By: Deborra Medina MD, Talia     PALPITATIONS, OCCASIONAL 04/17/2010   Qualifier: Diagnosis of  By: Deborra Medina MD, Talia     Pancreatitis 10/14/2017   Past Surgical History:  Procedure Laterality Date   BREAST EXCISIONAL BIOPSY Left 1997   ? left side, done at time of reduction , benign   BREAST REDUCTION SURGERY     bilateral   CESAREAN SECTION     CHOLECYSTECTOMY     COLONOSCOPY     ESOPHAGOGASTRODUODENOSCOPY     ESOPHAGOGASTRODUODENOSCOPY (EGD) WITH PROPOFOL N/A 10/21/2020   Procedure: ESOPHAGOGASTRODUODENOSCOPY (EGD) WITH PROPOFOL;  Surgeon: Lesly Rubenstein, MD;  Location: [facility] ENDOSCOPY;   Service: Endoscopy;  Laterality: N/A;   REDUCTION MAMMAPLASTY Bilateral 1997   VAGINAL HYSTERECTOMY     Patient Active Problem List   Diagnosis Date Noted   Abnormal MRI, lumbar spine (01/29/2020) 02/19/2020   Anxiety due to MRI 01/25/2020   Claustrophobia associated to MRI 01/25/2020   Enthesopathy of knee region (Right) 01/25/2020   Enthesopathy of knee region (Left) 01/25/2020   Lumbar facet hypertrophy 01/25/2020   Lumbosacral radiculopathy at S1 (Left) 01/17/2020   Lumbosacral radiculopathy (S1) (Left) 01/17/2020   Chronic lower extremity pain (Bilateral) (L>R) 01/17/2020   History of pancreatitis 07/18/2019   Obstructive sleep apnea syndrome 01/02/2019   Lumbar facet arthropathy (Multilevel) (Bilateral) 12/14/2018   Osteoarthritis involving multiple joints 11/28/2018   Chronic hip pain (Left) 11/16/2018   Acute hip pain (Left) 11/16/2018   DDD (degenerative disc disease), lumbosacral 11/16/2018   Chronic hip pain (Secondary source of pain) (Bilateral) (L>R) 05/19/2017   Elevated C-reactive protein (CRP) 05/19/2017   Elevated sed rate 05/19/2017   Spondylosis without myelopathy or radiculopathy, lumbosacral region 05/19/2017   Pharmacologic therapy 05/06/2017   Disorder of skeletal system 05/06/2017   Problems influencing health status 05/06/2017   Chest pain with moderate risk of acute coronary syndrome 04/21/2017   DOE (  dyspnea on exertion) 04/21/2017   Intermittent palpitations 04/21/2017   Vitamin D insufficiency 05/04/2016   Depression 02/04/2016   Fatty liver 02/04/2016   Hypertension associated with diabetes ([place]) 02/04/2016   Chronic pain syndrome 01/15/2016   Acute low back pain 05/28/2015   Lumbar transverse process fracture (HCC) (Left L1) (03/11/15) 05/28/2015   Long term current use of opiate analgesic 05/01/2015   Long term prescription opiate use 05/01/2015   Opiate use (25 MME/Day) 05/01/2015   Encounter for therapeutic drug level monitoring 05/01/2015    Encounter for pain management planning 05/01/2015   Chronic low back pain (Primary Source of Pain) (Bilateral) (L>R) 05/01/2015   Lumbar facet syndrome (Bilateral) (L>R) 05/01/2015   Chronic knee pain [facility] source of pain) (Bilateral) (L>R) 05/01/2015   Lumbar spondylosis 05/01/2015   Neuropathic pain 05/01/2015   Neurogenic pain 05/01/2015   Myofascial pain 05/01/2015   Essential hypertriglyceridemia 04/26/2014   Type 2 diabetes mellitus with hyperglycemia, with long-term current use of insulin ([place]) 04/26/2014   Anxiety 05/15/2010   Obesity 05/15/2010   Hyperlipidemia 04/03/2010   Fatty liver disease 04/02/2010   Pure hypercholesterolemia 03/19/2010   GERD 04/17/2009   Constipation 04/17/2009   TOBACCO ABUSE 11/06/2008    ONSET DATE: 02/06/2021  REFERRING DIAG: Astrocytoma  THERAPY DIAG:  Muscle weakness (generalized)  Rationale for Evaluation and Treatment Rehabilitation  SUBJECTIVE:  Pt. Reports they are in the process of getting her medications clarified with the pharmacy, and MD office.   SUBJECTIVE STATEMENT: Pt. Reports that it is her daughter's birthday today. Pt accompanied by: significant other  PERTINENT HISTORY:  Pt. Is a 51 y.o. 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 PMHx includes: Anorexia, dehydration, Adult Failure to thrive, contracture of muscles multiple sites. Pt./caregiver reports recently being hospitalized with a UTI, and dehydration.   PRECAUTIONS: Fall  WEIGHT BEARING RESTRICTIONS No  PAIN:  Are you having pain?  Pt. Reports 3/10 pain in the right UE, and digits. Pt. intermittent facial, and verbal responses reflect increased pain levels.  FALLS: Has patient fallen in last 6 months? Yes. Number of falls Multiple Falls  LIVING ENVIRONMENT: Lives with: Family Lives in: House/apartment Stairs: Yes: External: 4 steps; bilateral but cannot reach  both Has following equipment at home: Single point cane, Walker - 4 wheeled, Hemi walker, shower chair, bed side commode, and Grab bars Lift chair in the tub, [place],  PLOF: Independent  PATIENT GOALS: To improve the right UE  OBJECTIVE:   HAND DOMINANCE: Right  ADLs: Transfers/ambulation related to ADLs: Eating: MaxA with cutting food, setting up, Modified I with nondominant Left hand, Silicon Mat  Grooming: Independent  UB Dressing: MaxA  LB Dressing: MaxA Toileting: Uses a BSCommode mInA transfers, MaxA clothing negotiation Bathing: MaxA Tub Shower transfers:  Uses a shower lift chair Equipment:  w/c, BSCommode, cane   IADLs: Shopping: Husband performs Light housekeeping: Max Meal Prep: MaxA Community mobility: Relies on family Medication management:Husband assists Doctor, hospital: Performs together Handwriting: Not legible  MOBILITY STATUS: Hx of falls    ACTIVITY TOLERANCE: Activity tolerance: limited  FUNCTIONAL OUTCOME MEASURES: FOTO: 11.76  UPPER EXTREMITY ROM  : WFL Left AROM/PROM   Passive ROM Right eval Right  08/18/21  Shoulder flexion 30   Shoulder abduction 40 62  Shoulder adduction    Shoulder extension    Shoulder internal rotation    Shoulder external rotation    Elbow flexion  120   Elbow extension -67   Wrist flexion Neutral   Wrist extension 12   Wrist ulnar deviation    Wrist radial deviation    Wrist pronation    Wrist supination    (Blank rows = not tested)   UPPER EXTREMITY MMT:     MMT Right eval Left eval  Shoulder flexion 0/5 4+/5  Shoulder abduction 0/5 4+/5  Shoulder adduction    Shoulder extension    Shoulder internal rotation    Shoulder external rotation    Middle trapezius    Lower trapezius    Elbow flexion 0/5 4+/5  Elbow extension 0/5 4+/5  Wrist flexion    Wrist extension 0/5 4+/5  Wrist ulnar deviation    Wrist radial deviation    Wrist pronation    Wrist supination    (Blank rows = not  tested)    SENSATION: Light touch: WFL  EDEMA:   Wrist: 18.5 cm MCP: 21.5 cm    MUSCLE TONE: RUE: Severe  COGNITION: Overall cognitive status: Impaired  VISION: Subjective report: Wears glasses Baseline vision: Wears glasses all the time Pt. Reports no changes    PRAXIS: Impaired: Motor planning     TODAY'S TREATMENT:   Pt. tolerated retrograde massage to the right hand, and digits secondary to edema. Pt. tolerated soft tissue massage to the muscles in the right scapular region. Manual therapy was perormed prior to, and in preparation for ROM, and therapeutic Ex. Pt. tolerated PROM for right wrist flexion and extension, digit flexion and extension, and thumb abduction. Pt./caregiver education was provided about  the  right hand palm protector, edema control techniques, ROM, positioning in right shoulder. Pt. Continues to tolerate the palm protector with 1" roll without skin irritation, or breakdown in the right palm. Pt. Does have an area of redness, and irritation on the lateral aspect of the 2nd digit from the thumb nail when she is not wearing the palm protector.   Pt. presents with a red area on the lateral aspect of her right 2nd digit from the thumb nail when pt. is not wearing the palm protector secondary to tight flexor synergy. Pt.'s right shoulder flexion, and abduction, and digit extension ROM continues to be  limited by pain, however pt. was able to tolerate increased shoulder abduction, and digit extension today. Pt reports 3/10 pain.  Pt. continues to be able to tolerate slightly more abduction today following moist heat pack modality to the right shoulder, and soft tissue massage to the scapular region. Pt. presents with increased flexor tightness in the right 2nd, and 3rd digits.  Pt. continues to initially maintain the digits on her right hand in tight flexor synergy pattern. Pt. was able to tolerate retrograde massage for edema control, and ROM to the right hand,  and digits. Pt. continues to work on improving pain, and improving RUE ROM in order to improve engagement with ADLs/IADLs, and promote good skin integrity through the palmar surface of the hand.   PATIENT EDUCATION: Education details: Right UE ROM, positioning of the RUE Person educated: Patient and Spouse Education method: Explanation, Demonstration, Tactile cues, and Verbal cues Education comprehension: verbalized understanding, returned demonstration, and needs further education   HOME EXERCISE PROGRAM:   Pt./caregiver education about positioning of the RUE, ROM    GOALS: Goals reviewed with patient? Yes  SHORT TERM GOALS: Target date: 09/15/2021    Pt./caregivers will demonstrate independence with HEPs for UE ROM, and edema control Baseline: 09/08/2021: Pt. Requires assist  for positioning, ROM, edema control. Eval: No current HEPs  Goal status: Ongoing    LONG TERM GOALS: Target date: 10/27/2021    Pt. Will improve right hand edema by 1 cm at the wrist, and MCPs Baseline: 09/08/2021: Wrist: 17.5 cm, MCPs: 20.5 cm Eval: Wrist: 18.5cm, MCPs: 21.5cm Goal status: Ongoing  2.  Pt. Will improve right shoulder PROM by 10 degrees with ADLs Baseline: 09/08/2021: Right shoulder flexion: 48, abduction: 53 Eval: PROM shoulder flexion: 30 degrees, Abduction: 40 degrees Goal status: Ongoing  3.  Pt. Will improve right elbow PROM by 10 degrees to prepare for hand to mouth patterns Baseline: 09/08/2021: elbow extension: -46, flexion: 110 Eval:   elbow extension -67, flexion 120 Goal status: Ongoing  4.  Pt. Will improve PROM right wrist flexion, and extension by 10 degrees Baseline: 09/08/2021: Wrist extension: 40Eval: wrist extension 12 degrees, flexion to neutral Goal status: Ongoing  5.  Pt. Will tolerate full digit extension in the right hand to promote good skin integrity through the palmar surface of the right hand  Baseline: 09/08/2021: Pt. Is able to tolerate PROM through 50% of  digit extension in the digits. Eval: Pt. Maintains digits in tight flexion Goal status: Ongoing  6.  Pt. Will require minA UE dressing using one armed dressing techniques. Baseline: 09/08/2021: MaxA Eval: MaxA Goal status: Ongoing  ASSESSMENT:  CLINICAL IMPRESSION:  Pt. presents with a red area on the lateral aspect of her right 2nd digit from the thumb nail when pt. is not wearing the palm protector secondary to tight flexor synergy. Pt.'s right shoulder flexion, and abduction, and digit extension ROM continues to be  limited by pain, however pt. was able to tolerate increased shoulder abduction, and digit extension today. Pt reports 3/10 pain.  Pt. continues to be able to tolerate slightly more abduction today following moist heat pack modality to the right shoulder, and soft tissue massage to the scapular region. Pt. presents with increased flexor tightness in the right 2nd, and 3rd digits.  Pt. continues to initially maintain the digits on her right hand in tight flexor synergy pattern. Pt. was able to tolerate retrograde massage for edema control, and ROM to the right hand, and digits. Pt. continues to work on improving pain, and improving RUE ROM in order to improve engagement with ADLs/IADLs, and promote good skin integrity through the palmar surface of the hand.  PERFORMANCE DEFICITS in functional skills including ADLs, IADLs, coordination, proprioception, sensation, edema, tone, ROM, strength, pain, FMC, GMC, balance, decreased knowledge of use of DME, skin integrity, and UE functional use, cognitive skills including attention, memory, and safety awareness, and psychosocial skills including coping strategies.   IMPAIRMENTS are limiting patient from ADLs, IADLs, education, work, leisure, and social participation.   COMORBIDITIES may have co-morbidities  that affects occupational performance. Patient will benefit from skilled OT to address above impairments and improve overall  function.  MODIFICATION OR ASSISTANCE TO COMPLETE EVALUATION: Maximum modification of tasks or assist with assess necessary to complete an evaluation.  OT OCCUPATIONAL PROFILE AND HISTORY: Detailed assessment: Review of records and additional review of physical, cognitive, psychosocial history related to current functional performance.  CLINICAL DECISION MAKING: High - multiple treatment options, significant modification of task necessary  REHAB POTENTIAL: Good for stated goals  EVALUATION COMPLEXITY: High    PLAN: OT FREQUENCY: 2x/week  OT DURATION: 12 weeks  PLANNED INTERVENTIONS: self care/ADL training, therapeutic exercise, therapeutic activity, neuromuscular re-education, manual therapy, passive range of motion, paraffin, moist heat,  contrast bath, patient/family education, cognitive remediation/compensation, energy conservation, and coping strategies training  RECOMMENDED OTHER SERVICES: PT, and ST  CONSULTED AND AGREED WITH PLAN OF CARE: Patient and family member/caregiver  PLAN FOR NEXT SESSION: Initiate PROM, and RUE Edema control techniques   Harrel Carina, MS, OTR/L  Harrel Carina, OT 09/17/2021, 9:07 AM           OUTPATIENT OCCUPATIONAL THERAPY TREATMENT  Patient Name: Kelly Rollins MRN: [id number] DOB:May 30, 1970, 51 y.o., female Today's Date: 09/17/2021  PCP: [name], PA-C REFERRING PROVIDER: [name], Corrine Jeani Hawking, PA-C   OT End of Session - 09/17/21 0856     Visit Number 12    Number of Visits 24    Date for OT Re-Evaluation 10/27/21    Authorization Time Period Progress reports period starting 08/04/2021    OT Start Time 1515    OT Stop Time 1600    OT Time Calculation (min) 45 min    Activity Tolerance Patient tolerated treatment well    Behavior During Therapy [facility] for tasks assessed/performed             Past Medical History:  Diagnosis Date   ABDOMINAL PAIN OTHER SPECIFIED SITE 02/21/2009   Qualifier:  Diagnosis of  By: Deborra Medina MD, Talia     ABDOMINAL PAIN-LUQ 04/17/2009   Qualifier: Diagnosis of  By: Fuller Plan MD Lamont Snowball T    Anal fissure    Anginal pain ([place])    Asthma    Depression    Diabetes mellitus without complication ([place])    Dyspnea    Dysrhythmia    Headache(784.0)    HEMATOCHEZIA 04/17/2009   Qualifier: Diagnosis of  By: Fuller Plan MD Lamont Snowball T    Hyperlipidemia    Hypertension    IBS (irritable bowel syndrome)    Migraine headache 11/06/2008   Qualifier: Diagnosis of  By: Deborra Medina MD, Talia     OVARIAN CYST, RIGHT 02/21/2009   Qualifier: Diagnosis of  By: Deborra Medina MD, Talia     PALPITATIONS, OCCASIONAL 04/17/2010   Qualifier: Diagnosis of  By: Deborra Medina MD, Talia     Pancreatitis 10/14/2017   Past Surgical History:  Procedure Laterality Date   BREAST EXCISIONAL BIOPSY Left 1997   ? left side, done at time of reduction , benign   BREAST REDUCTION SURGERY     bilateral   CESAREAN SECTION     CHOLECYSTECTOMY     COLONOSCOPY     ESOPHAGOGASTRODUODENOSCOPY     ESOPHAGOGASTRODUODENOSCOPY (EGD) WITH PROPOFOL N/A 10/21/2020   Procedure: ESOPHAGOGASTRODUODENOSCOPY (EGD) WITH PROPOFOL;  Surgeon: Lesly Rubenstein, MD;  Location: [facility] ENDOSCOPY;  Service: Endoscopy;  Laterality: N/A;   REDUCTION MAMMAPLASTY Bilateral 1997   VAGINAL HYSTERECTOMY     Patient Active Problem List   Diagnosis Date Noted   Abnormal MRI, lumbar spine (01/29/2020) 02/19/2020   Anxiety due to MRI 01/25/2020   Claustrophobia associated to MRI 01/25/2020   Enthesopathy of knee region (Right) 01/25/2020   Enthesopathy of knee region (Left) 01/25/2020   Lumbar facet hypertrophy 01/25/2020   Lumbosacral radiculopathy at S1 (Left) 01/17/2020   Lumbosacral radiculopathy (S1) (Left) 01/17/2020   Chronic lower extremity pain (Bilateral) (L>R) 01/17/2020   History of pancreatitis 07/18/2019   Obstructive sleep apnea syndrome 01/02/2019   Lumbar facet arthropathy (Multilevel) (Bilateral) 12/14/2018    Osteoarthritis involving multiple joints 11/28/2018   Chronic hip pain (Left) 11/16/2018   Acute hip pain (Left) 11/16/2018   DDD (degenerative disc disease), lumbosacral 11/16/2018  Chronic hip pain (Secondary source of pain) (Bilateral) (L>R) 05/19/2017   Elevated C-reactive protein (CRP) 05/19/2017   Elevated sed rate 05/19/2017   Spondylosis without myelopathy or radiculopathy, lumbosacral region 05/19/2017   Pharmacologic therapy 05/06/2017   Disorder of skeletal system 05/06/2017   Problems influencing health status 05/06/2017   Chest pain with moderate risk of acute coronary syndrome 04/21/2017   DOE (dyspnea on exertion) 04/21/2017   Intermittent palpitations 04/21/2017   Vitamin D insufficiency 05/04/2016   Depression 02/04/2016   Fatty liver 02/04/2016   Hypertension associated with diabetes ([place]) 02/04/2016   Chronic pain syndrome 01/15/2016   Acute low back pain 05/28/2015   Lumbar transverse process fracture (HCC) (Left L1) (03/11/15) 05/28/2015   Long term current use of opiate analgesic 05/01/2015   Long term prescription opiate use 05/01/2015   Opiate use (25 MME/Day) 05/01/2015   Encounter for therapeutic drug level monitoring 05/01/2015   Encounter for pain management planning 05/01/2015   Chronic low back pain (Primary Source of Pain) (Bilateral) (L>R) 05/01/2015   Lumbar facet syndrome (Bilateral) (L>R) 05/01/2015   Chronic knee pain [facility] source of pain) (Bilateral) (L>R) 05/01/2015   Lumbar spondylosis 05/01/2015   Neuropathic pain 05/01/2015   Neurogenic pain 05/01/2015   Myofascial pain 05/01/2015   Essential hypertriglyceridemia 04/26/2014   Type 2 diabetes mellitus with hyperglycemia, with long-term current use of insulin ([place]) 04/26/2014   Anxiety 05/15/2010   Obesity 05/15/2010   Hyperlipidemia 04/03/2010   Fatty liver disease 04/02/2010   Pure hypercholesterolemia 03/19/2010   GERD 04/17/2009   Constipation 04/17/2009   TOBACCO ABUSE  11/06/2008    ONSET DATE: 02/06/2021  REFERRING DIAG: Astrocytoma  THERAPY DIAG:  Muscle weakness (generalized)  Rationale for Evaluation and Treatment Rehabilitation  SUBJECTIVE:  Pt. Reports they are in the process of getting her medications clarified with the pharmacy, and MD office.   SUBJECTIVE STATEMENT: Pt. Reports that it is her daughter's birthday today. Pt accompanied by: significant other  PERTINENT HISTORY:  Pt. Is a 51 y.o. 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 PMHx includes: Anorexia, dehydration, Adult Failure to thrive, contracture of muscles multiple sites. Pt./caregiver reports recently being hospitalized with a UTI, and dehydration.   PRECAUTIONS: Fall  WEIGHT BEARING RESTRICTIONS No  PAIN:  Are you having pain?  Pt. Reports 3/10 pain in the right UE, and digits. Pt. intermittent facial, and verbal responses reflect increased pain levels.  FALLS: Has patient fallen in last 6 months? Yes. Number of falls Multiple Falls  LIVING ENVIRONMENT: Lives with: Family Lives in: House/apartment Stairs: Yes: External: 4 steps; bilateral but cannot reach both Has following equipment at home: Single point cane, Walker - 4 wheeled, Hemi walker, shower chair, bed side commode, and Grab bars Lift chair in the tub, [place],  PLOF: Independent  PATIENT GOALS: To improve the right UE  OBJECTIVE:   HAND DOMINANCE: Right  ADLs: Transfers/ambulation related to ADLs: Eating: MaxA with cutting food, setting up, Modified I with nondominant Left hand, Silicon Mat  Grooming: Independent  UB Dressing: MaxA  LB Dressing: MaxA Toileting: Uses a BSCommode mInA transfers, MaxA clothing negotiation Bathing: MaxA Tub Shower transfers:  Uses a shower lift chair Equipment:  w/c, BSCommode, cane   IADLs: Shopping: Husband performs Light housekeeping: Max Meal Prep: MaxA Community  mobility: Relies on family Medication management:Husband assists Doctor, hospital: Performs together Handwriting: Not legible  MOBILITY STATUS: Hx of falls  ACTIVITY TOLERANCE: Activity tolerance: limited  FUNCTIONAL OUTCOME MEASURES: FOTO: 11.76  UPPER EXTREMITY ROM  : WFL Left AROM/PROM   Passive ROM Right eval Right  08/18/21  Shoulder flexion 30   Shoulder abduction 40 62  Shoulder adduction    Shoulder extension    Shoulder internal rotation    Shoulder external rotation    Elbow flexion 120   Elbow extension -67   Wrist flexion Neutral   Wrist extension 12   Wrist ulnar deviation    Wrist radial deviation    Wrist pronation    Wrist supination    (Blank rows = not tested)   UPPER EXTREMITY MMT:     MMT Right eval Left eval  Shoulder flexion 0/5 4+/5  Shoulder abduction 0/5 4+/5  Shoulder adduction    Shoulder extension    Shoulder internal rotation    Shoulder external rotation    Middle trapezius    Lower trapezius    Elbow flexion 0/5 4+/5  Elbow extension 0/5 4+/5  Wrist flexion    Wrist extension 0/5 4+/5  Wrist ulnar deviation    Wrist radial deviation    Wrist pronation    Wrist supination    (Blank rows = not tested)    SENSATION: Light touch: WFL  EDEMA:   Wrist: 18.5 cm MCP: 21.5 cm    MUSCLE TONE: RUE: Severe  COGNITION: Overall cognitive status: Impaired  VISION: Subjective report: Wears glasses Baseline vision: Wears glasses all the time Pt. Reports no changes    PRAXIS: Impaired: Motor planning     TODAY'S TREATMENT:   Pt. tolerated retrograde massage to the right hand, and digits secondary to edema. Pt. tolerated soft tissue massage to the muscles in the right scapular region. Manual therapy was perormed prior to, and in preparation for ROM, and therapeutic Ex. Pt. tolerated PROM for right wrist flexion and extension, digit flexion and extension, and thumb abduction. Pt./caregiver education was provided  about  the  right hand palm protector, edema control techniques, ROM, positioning in right shoulder. Pt. Continues to tolerate the palm protector with 1" roll without skin irritation, or breakdown in the right palm. Pt. Does have an area of redness, and irritation on the lateral aspect of the 2nd digit from the thumb nail when she is not wearing the palm protector.   Pt. presents with a red area on the lateral aspect of her right 2nd digit from the thumb nail when pt. is not wearing the palm protector secondary to tight flexor synergy. Pt.'s right shoulder flexion, and abduction, and digit extension ROM continues to be  limited by pain, however pt. was able to tolerate increased shoulder abduction, and digit extension today. Pt reports 3/10 pain.  Pt. continues to be able to tolerate slightly more abduction today following moist heat pack modality to the right shoulder, and soft tissue massage to the scapular region. Pt. presents with increased flexor tightness in the right 2nd, and 3rd digits.  Pt. continues to initially maintain the digits on her right hand in tight flexor synergy pattern. Pt. was able to tolerate retrograde massage for edema control, and ROM to the right hand, and digits. Pt. continues to work on improving pain, and improving RUE ROM in order to improve engagement with ADLs/IADLs, and promote good skin integrity through the palmar surface of the hand.   PATIENT EDUCATION: Education details: Right UE ROM, positioning of the RUE Person educated: Patient and Spouse Education method: Explanation, Demonstration, Tactile cues, and  Verbal cues Education comprehension: verbalized understanding, returned demonstration, and needs further education   HOME EXERCISE PROGRAM:   Pt./caregiver education about positioning of the RUE, ROM    GOALS: Goals reviewed with patient? Yes  SHORT TERM GOALS: Target date: 09/15/2021    Pt./caregivers with demonstrate independence with HEPs for UE ROM,  and edema control Baseline: No current HEPs Goal status: INITIAL    LONG TERM GOALS: Target date: 10/27/2021    Pt. Will improve right hand edema by 1 cm at the wrist, and MCPs Baseline: Eval: Wrist: 18.5cm, MCPs: 21.5cm Goal status: INITIAL  2.  Pt. Will improve right shoulder PROM by 10 degrees with ADLs Baseline: Eval: PROM shoulder flexion: 30 degrees, Abduction: 40 degrees Goal status: INITIAL  3.  Pt. Will improve right elbow PROM by 10 degrees to prepare for hand to mouth patterns Baseline: Eval:   elbow extension -67, flexion 120 Goal status: INITIAL  4.  Pt. Will improve PROM right wrist flexion, and extension by 10 degrees Baseline: Eval: wrist extension 12 degrees, flexion to neutral Goal status: INITIAL  5.  Pt. Will tolerate full digit extension in the right hand to promote good skin integrity through the palmar surface of the right hand  Baseline: Pt. Maintains digits in tight flexion Goal status: INITIAL  6.  Pt. Will require minA UE dressing using one armed dressing techniques. Baseline: MaxA Goal status: INITIAL  ASSESSMENT:  CLINICAL IMPRESSION:  Pt. presents with a red area on the lateral aspect of her right 2nd digit from the thumb nail when pt. is not wearing the palm protector secondary to tight flexor synergy. Pt.'s right shoulder flexion, and abduction, and digit extension ROM continues to be  limited by pain, however pt. was able to tolerate increased shoulder abduction, and digit extension today. Pt reports 3/10 pain.  Pt. continues to be able to tolerate slightly more abduction today following moist heat pack modality to the right shoulder, and soft tissue massage to the scapular region. Pt. presents with increased flexor tightness in the right 2nd, and 3rd digits.  Pt. continues to initially maintain the digits on her right hand in tight flexor synergy pattern. Pt. was able to tolerate retrograde massage for edema control, and ROM to the right hand,  and digits. Pt. continues to work on improving pain, and improving RUE ROM in order to improve engagement with ADLs/IADLs, and promote good skin integrity through the palmar surface of the hand.  PERFORMANCE DEFICITS in functional skills including ADLs, IADLs, coordination, proprioception, sensation, edema, tone, ROM, strength, pain, FMC, GMC, balance, decreased knowledge of use of DME, skin integrity, and UE functional use, cognitive skills including attention, memory, and safety awareness, and psychosocial skills including coping strategies.   IMPAIRMENTS are limiting patient from ADLs, IADLs, education, work, leisure, and social participation.   COMORBIDITIES may have co-morbidities  that affects occupational performance. Patient will benefit from skilled OT to address above impairments and improve overall function.  MODIFICATION OR ASSISTANCE TO COMPLETE EVALUATION: Maximum modification of tasks or assist with assess necessary to complete an evaluation.  OT OCCUPATIONAL PROFILE AND HISTORY: Detailed assessment: Review of records and additional review of physical, cognitive, psychosocial history related to current functional performance.  CLINICAL DECISION MAKING: High - multiple treatment options, significant modification of task necessary  REHAB POTENTIAL: Good for stated goals  EVALUATION COMPLEXITY: High    PLAN: OT FREQUENCY: 2x/week  OT DURATION: 12 weeks  PLANNED INTERVENTIONS: self care/ADL training, therapeutic exercise, therapeutic activity, neuromuscular  re-education, manual therapy, passive range of motion, paraffin, moist heat, contrast bath, patient/family education, cognitive remediation/compensation, energy conservation, and coping strategies training  RECOMMENDED OTHER SERVICES: PT, and ST  CONSULTED AND AGREED WITH PLAN OF CARE: Patient and family member/caregiver  PLAN FOR NEXT SESSION: Initiate PROM, and RUE Edema control techniques   Harrel Carina, MS,  OTR/L  Harrel Carina, OT 09/17/2021, 9:07 AM           OUTPATIENT OCCUPATIONAL THERAPY TREATMENT  Patient Name: Kelly Rollins MRN: [id number] DOB:1970/07/21, 51 y.o., female Today's Date: 09/17/2021  PCP: [name], PA-C REFERRING PROVIDER: [name], Corrine Jeani Hawking, PA-C   OT End of Session - 09/17/21 0856     Visit Number 12    Number of Visits 24    Date for OT Re-Evaluation 10/27/21    Authorization Time Period Progress reports period starting 08/04/2021    OT Start Time 1515    OT Stop Time 1600    OT Time Calculation (min) 45 min    Activity Tolerance Patient tolerated treatment well    Behavior During Therapy [facility] for tasks assessed/performed             Past Medical History:  Diagnosis Date   ABDOMINAL PAIN OTHER SPECIFIED SITE 02/21/2009   Qualifier: Diagnosis of  By: Deborra Medina MD, Talia     ABDOMINAL PAIN-LUQ 04/17/2009   Qualifier: Diagnosis of  By: Fuller Plan MD Lamont Snowball T    Anal fissure    Anginal pain ([place])    Asthma    Depression    Diabetes mellitus without complication ([place])    Dyspnea    Dysrhythmia    Headache(784.0)    HEMATOCHEZIA 04/17/2009   Qualifier: Diagnosis of  By: Fuller Plan MD Lamont Snowball T    Hyperlipidemia    Hypertension    IBS (irritable bowel syndrome)    Migraine headache 11/06/2008   Qualifier: Diagnosis of  By: Deborra Medina MD, Talia     OVARIAN CYST, RIGHT 02/21/2009   Qualifier: Diagnosis of  By: Deborra Medina MD, Talia     PALPITATIONS, OCCASIONAL 04/17/2010   Qualifier: Diagnosis of  By: Deborra Medina MD, Talia     Pancreatitis 10/14/2017   Past Surgical History:  Procedure Laterality Date   BREAST EXCISIONAL BIOPSY Left 1997   ? left side, done at time of reduction , benign   BREAST REDUCTION SURGERY     bilateral   CESAREAN SECTION     CHOLECYSTECTOMY     COLONOSCOPY     ESOPHAGOGASTRODUODENOSCOPY     ESOPHAGOGASTRODUODENOSCOPY (EGD) WITH PROPOFOL N/A 10/21/2020   Procedure: ESOPHAGOGASTRODUODENOSCOPY (EGD) WITH  PROPOFOL;  Surgeon: Lesly Rubenstein, MD;  Location: [facility] ENDOSCOPY;  Service: Endoscopy;  Laterality: N/A;   REDUCTION MAMMAPLASTY Bilateral 1997   VAGINAL HYSTERECTOMY     Patient Active Problem List   Diagnosis Date Noted   Abnormal MRI, lumbar spine (01/29/2020) 02/19/2020   Anxiety due to MRI 01/25/2020   Claustrophobia associated to MRI 01/25/2020   Enthesopathy of knee region (Right) 01/25/2020   Enthesopathy of knee region (Left) 01/25/2020   Lumbar facet hypertrophy 01/25/2020   Lumbosacral radiculopathy at S1 (Left) 01/17/2020   Lumbosacral radiculopathy (S1) (Left) 01/17/2020   Chronic lower extremity pain (Bilateral) (L>R) 01/17/2020   History of pancreatitis 07/18/2019   Obstructive sleep apnea syndrome 01/02/2019   Lumbar facet arthropathy (Multilevel) (Bilateral) 12/14/2018   Osteoarthritis involving multiple joints 11/28/2018   Chronic hip pain (Left) 11/16/2018   Acute hip pain (Left) 11/16/2018  DDD (degenerative disc disease), lumbosacral 11/16/2018   Chronic hip pain (Secondary source of pain) (Bilateral) (L>R) 05/19/2017   Elevated C-reactive protein (CRP) 05/19/2017   Elevated sed rate 05/19/2017   Spondylosis without myelopathy or radiculopathy, lumbosacral region 05/19/2017   Pharmacologic therapy 05/06/2017   Disorder of skeletal system 05/06/2017   Problems influencing health status 05/06/2017   Chest pain with moderate risk of acute coronary syndrome 04/21/2017   DOE (dyspnea on exertion) 04/21/2017   Intermittent palpitations 04/21/2017   Vitamin D insufficiency 05/04/2016   Depression 02/04/2016   Fatty liver 02/04/2016   Hypertension associated with diabetes ([place]) 02/04/2016   Chronic pain syndrome 01/15/2016   Acute low back pain 05/28/2015   Lumbar transverse process fracture (HCC) (Left L1) (03/11/15) 05/28/2015   Long term current use of opiate analgesic 05/01/2015   Long term prescription opiate use 05/01/2015   Opiate use (25 MME/Day)  05/01/2015   Encounter for therapeutic drug level monitoring 05/01/2015   Encounter for pain management planning 05/01/2015   Chronic low back pain (Primary Source of Pain) (Bilateral) (L>R) 05/01/2015   Lumbar facet syndrome (Bilateral) (L>R) 05/01/2015   Chronic knee pain [facility] source of pain) (Bilateral) (L>R) 05/01/2015   Lumbar spondylosis 05/01/2015   Neuropathic pain 05/01/2015   Neurogenic pain 05/01/2015   Myofascial pain 05/01/2015   Essential hypertriglyceridemia 04/26/2014   Type 2 diabetes mellitus with hyperglycemia, with long-term current use of insulin ([place]) 04/26/2014   Anxiety 05/15/2010   Obesity 05/15/2010   Hyperlipidemia 04/03/2010   Fatty liver disease 04/02/2010   Pure hypercholesterolemia 03/19/2010   GERD 04/17/2009   Constipation 04/17/2009   TOBACCO ABUSE 11/06/2008    ONSET DATE: 02/06/2021  REFERRING DIAG: Astrocytoma  THERAPY DIAG:  Muscle weakness (generalized)  Rationale for Evaluation and Treatment Rehabilitation  SUBJECTIVE:  Pt. Reports they are in the process of getting her medications clarified with the pharmacy, and MD office.   SUBJECTIVE STATEMENT: Pt. Reports that it is her daughter's birthday today. Pt accompanied by: significant other  PERTINENT HISTORY:  Pt. Is a 51 y.o. 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 PMHx includes: Anorexia, dehydration, Adult Failure to thrive, contracture of muscles multiple sites. Pt./caregiver reports recently being hospitalized with a UTI, and dehydration.   PRECAUTIONS: Fall  WEIGHT BEARING RESTRICTIONS No  PAIN:  Are you having pain?  Pt. Reports 3/10 pain in the right UE, and digits. Pt. intermittent facial, and verbal responses reflect increased pain levels.  FALLS: Has patient fallen in last 6 months? Yes. Number of falls Multiple Falls  LIVING ENVIRONMENT: Lives with: Family Lives in:  House/apartment Stairs: Yes: External: 4 steps; bilateral but cannot reach both Has following equipment at home: Single point cane, Walker - 4 wheeled, Hemi walker, shower chair, bed side commode, and Grab bars Lift chair in the tub, [place],  PLOF: Independent  PATIENT GOALS: To improve the right UE  OBJECTIVE:   HAND DOMINANCE: Right  ADLs: Transfers/ambulation related to ADLs: Eating: MaxA with cutting food, setting up, Modified I with nondominant Left hand, [place]  Grooming: Independent  UB Dressing: MaxA  LB Dressing: MaxA Toileting: Uses a BSCommode mInA transfers, MaxA clothing negotiation Bathing: MaxA Tub Shower transfers:  Uses a shower lift chair Equipment:  w/c, BSCommode, cane   IADLs: Shopping: Husband performs Light housekeeping: Max Meal Prep: MaxA Community mobility: Relies on family Medication management:Husband assists Doctor, hospital: Performs together Handwriting:  Not legible  MOBILITY STATUS: Hx of falls    ACTIVITY TOLERANCE: Activity tolerance: limited  FUNCTIONAL OUTCOME MEASURES: FOTO: 11.76  UPPER EXTREMITY ROM  : WFL Left AROM/PROM   Passive ROM Right eval Right  08/18/21 Right 09/10/21  Shoulder flexion 30  61  Shoulder abduction 40 62 66  Shoulder adduction     Shoulder extension     Shoulder internal rotation     Shoulder external rotation     Elbow flexion 120    Elbow extension -67    Wrist flexion Neutral    Wrist extension 12    Wrist ulnar deviation     Wrist radial deviation     Wrist pronation     Wrist supination     (Blank rows = not tested)   UPPER EXTREMITY MMT:     MMT Right eval Left eval  Shoulder flexion 0/5 4+/5  Shoulder abduction 0/5 4+/5  Shoulder adduction    Shoulder extension    Shoulder internal rotation    Shoulder external rotation    Middle trapezius    Lower trapezius    Elbow flexion 0/5 4+/5  Elbow extension 0/5 4+/5  Wrist flexion    Wrist extension 0/5 4+/5  Wrist  ulnar deviation    Wrist radial deviation    Wrist pronation    Wrist supination    (Blank rows = not tested)    SENSATION: Light touch: WFL  EDEMA:   Wrist: 18.5 cm MCP: 21.5 cm    MUSCLE TONE: RUE: Severe  COGNITION: Overall cognitive status: Impaired  VISION: Subjective report: Wears glasses Baseline vision: Wears glasses all the time Pt. Reports no changes    PRAXIS: Impaired: Motor planning     TODAY'S TREATMENT:   Pt. tolerated retrograde massage to the right hand, and digits secondary to edema. Pt. tolerated soft tissue massage to the muscles in the right scapular region. Manual therapy was perormed prior to, and in preparation for ROM, and therapeutic Ex. Pt. tolerated PROM for right wrist flexion and extension, digit flexion and extension, and thumb abduction. Pt./caregiver education was provided about  the  right hand palm protector, edema control techniques, ROM, positioning in right shoulder. Pt. Continues to tolerate the palm protector with 1" roll without skin irritation, or breakdown in the right palm. Pt. education was provided about self-ROM to the right hand/digits.  Pt. Has an appointment at [facility] on Thursday for follow-up from her recent brain Imaging. Pt. presented with increased PROM form right shoulder flexion, and abduction. Pt.'s right shoulder flexion, abduction, and digit extension ROM continues to be  limited by pain, however pt. was able to tolerate increased shoulder abduction, and digit extension today. Pt continues to report 3/10 pain.  Pt. continues to be able to tolerate slightly more abduction today following moist heat pack modality to the right shoulder, and soft tissue massage to the scapular region. Pt. presents with increased flexor tightness in the right 2nd, and 3rd digits.  Pt. Was provided with fresh palm protectors for her right hand. Pt. continues to initially maintain the digits on her right hand in tight flexor synergy pattern. Pt.  was able to tolerate retrograde massage for edema control, and ROM to the right hand, and digits. Pt. continues to work on improving pain, and improving RUE ROM in order to improve engagement with ADLs/IADLs, and promote good skin integrity through the palmar surface of the hand.   PATIENT EDUCATION: Education details: Right UE ROM, positioning of  the RUE Person educated: Patient and Spouse Education method: Explanation, Demonstration, Tactile cues, and Verbal cues Education comprehension: verbalized understanding, returned demonstration, and needs further education   HOME EXERCISE PROGRAM:   Pt./caregiver education about positioning of the RUE, ROM    GOALS: Goals reviewed with patient? Yes  SHORT TERM GOALS: Target date: 09/15/2021    Pt./caregivers with demonstrate independence with HEPs for UE ROM, and edema control Baseline: No current HEPs Goal status: INITIAL    LONG TERM GOALS: Target date: 10/27/2021    Pt. Will improve right hand edema by 1 cm at the wrist, and MCPs Baseline: Eval: Wrist: 18.5cm, MCPs: 21.5cm Goal status: INITIAL  2.  Pt. Will improve right shoulder PROM by 10 degrees with ADLs Baseline: Eval: PROM shoulder flexion: 30 degrees, Abduction: 40 degrees Goal status: INITIAL  3.  Pt. Will improve right elbow PROM by 10 degrees to prepare for hand to mouth patterns Baseline: Eval:   elbow extension -67, flexion 120 Goal status: INITIAL  4.  Pt. Will improve PROM right wrist flexion, and extension by 10 degrees Baseline: Eval: wrist extension 12 degrees, flexion to neutral Goal status: INITIAL  5.  Pt. Will tolerate full digit extension in the right hand to promote good skin integrity through the palmar surface of the right hand  Baseline: Pt. Maintains digits in tight flexion Goal status: INITIAL  6.  Pt. Will require minA UE dressing using one armed dressing techniques. Baseline: MaxA Goal status: INITIAL  ASSESSMENT:  CLINICAL  IMPRESSION:  Pt. Has an appointment at [facility] on Thursday for follow-up from her recent brain Imaging. Pt. presented with increased PROM form right shoulder flexion, and abduction. Pt.'s right shoulder flexion, abduction, and digit extension ROM continues to be  limited by pain, however pt. was able to tolerate increased shoulder abduction, and digit extension today. Pt continues to report 3/10 pain.  Pt. continues to be able to tolerate slightly more abduction today following moist heat pack modality to the right shoulder, and soft tissue massage to the scapular region. Pt. presents with increased flexor tightness in the right 2nd, and 3rd digits.  Pt. Was provided with fresh palm protectors for her right hand. Pt. continues to initially maintain the digits on her right hand in tight flexor synergy pattern. Pt. was able to tolerate retrograde massage for edema control, and ROM to the right hand, and digits. Pt. continues to work on improving pain, and improving RUE ROM in order to improve engagement with ADLs/IADLs, and promote good skin integrity through the palmar surface of the hand.   PERFORMANCE DEFICITS in functional skills including ADLs, IADLs, coordination, proprioception, sensation, edema, tone, ROM, strength, pain, FMC, GMC, balance, decreased knowledge of use of DME, skin integrity, and UE functional use, cognitive skills including attention, memory, and safety awareness, and psychosocial skills including coping strategies.   IMPAIRMENTS are limiting patient from ADLs, IADLs, education, work, leisure, and social participation.   COMORBIDITIES may have co-morbidities  that affects occupational performance. Patient will benefit from skilled OT to address above impairments and improve overall function.  MODIFICATION OR ASSISTANCE TO COMPLETE EVALUATION: Maximum modification of tasks or assist with assess necessary to complete an evaluation.  OT OCCUPATIONAL PROFILE AND HISTORY: Detailed  assessment: Review of records and additional review of physical, cognitive, psychosocial history related to current functional performance.  CLINICAL DECISION MAKING: High - multiple treatment options, significant modification of task necessary  REHAB POTENTIAL: Good for stated goals  EVALUATION COMPLEXITY: High  PLAN: OT FREQUENCY: 2x/week  OT DURATION: 12 weeks  PLANNED INTERVENTIONS: self care/ADL training, therapeutic exercise, therapeutic activity, neuromuscular re-education, manual therapy, passive range of motion, paraffin, moist heat, contrast bath, patient/family education, cognitive remediation/compensation, energy conservation, and coping strategies training  RECOMMENDED OTHER SERVICES: PT, and ST  CONSULTED AND AGREED WITH PLAN OF CARE: Patient and family member/caregiver  PLAN FOR NEXT SESSION: Initiate PROM, and RUE Edema control techniques   Harrel Carina, MS, OTR/L  Harrel Carina, OT 09/17/2021, 9:07 AM

## 2021-09-18 ENCOUNTER — Ambulatory Visit: Payer: Medicare Other | Admitting: Speech Pathology

## 2021-09-18 ENCOUNTER — Ambulatory Visit: Payer: Medicare Other | Admitting: Occupational Therapy

## 2021-09-22 ENCOUNTER — Ambulatory Visit: Payer: Medicare Other

## 2021-09-22 ENCOUNTER — Ambulatory Visit: Payer: Medicare Other | Admitting: Speech Pathology

## 2021-09-25 ENCOUNTER — Encounter: Payer: Medicare Other | Admitting: Speech Pathology

## 2021-09-25 ENCOUNTER — Encounter: Payer: Medicare Other | Admitting: Occupational Therapy

## 2021-09-25 ENCOUNTER — Ambulatory Visit: Payer: Medicare Other

## 2021-09-30 ENCOUNTER — Ambulatory Visit: Payer: Medicare Other | Admitting: Speech Pathology

## 2021-09-30 ENCOUNTER — Ambulatory Visit: Payer: Medicare Other

## 2021-09-30 ENCOUNTER — Ambulatory Visit: Payer: Medicare Other | Admitting: Occupational Therapy

## 2021-10-02 ENCOUNTER — Ambulatory Visit: Payer: Medicare Other | Admitting: Occupational Therapy

## 2021-10-02 ENCOUNTER — Encounter: Payer: Self-pay | Admitting: Occupational Therapy

## 2021-10-02 ENCOUNTER — Ambulatory Visit: Payer: Medicare Other | Admitting: Speech Pathology

## 2021-10-02 ENCOUNTER — Ambulatory Visit: Payer: Medicare Other

## 2021-10-02 DIAGNOSIS — R262 Difficulty in walking, not elsewhere classified: Secondary | ICD-10-CM

## 2021-10-02 DIAGNOSIS — R41841 Cognitive communication deficit: Secondary | ICD-10-CM

## 2021-10-02 DIAGNOSIS — R2681 Unsteadiness on feet: Secondary | ICD-10-CM

## 2021-10-02 DIAGNOSIS — M6281 Muscle weakness (generalized): Secondary | ICD-10-CM

## 2021-10-02 DIAGNOSIS — R4701 Aphasia: Secondary | ICD-10-CM

## 2021-10-02 DIAGNOSIS — C719 Malignant neoplasm of brain, unspecified: Secondary | ICD-10-CM

## 2021-10-02 DIAGNOSIS — R278 Other lack of coordination: Secondary | ICD-10-CM

## 2021-10-02 NOTE — Therapy (Addendum)
OUTPATIENT OCCUPATIONAL THERAPY TREATMENT  Patient Name: Kelly Rollins MRN: [id number] DOB:1970-05-02, 51 y.o., female Today's Date: 10/02/2021  PCP: [name], PA-C REFERRING PROVIDER: [name], Corrine Jeani Hawking, PA-C   OT End of Session - 10/02/21 1738     Visit Number 13    Number of Visits 24    Date for OT Re-Evaluation 10/27/21    Authorization Time Period Progress reports period starting 08/04/2021    OT Start Time 1515    OT Stop Time 1600    OT Time Calculation (min) 45 min    Activity Tolerance Patient tolerated treatment well    Behavior During Therapy [facility] for tasks assessed/performed             Past Medical History:  Diagnosis Date   ABDOMINAL PAIN OTHER SPECIFIED SITE 02/21/2009   Qualifier: Diagnosis of  By: Deborra Medina MD, Talia     ABDOMINAL PAIN-LUQ 04/17/2009   Qualifier: Diagnosis of  By: Fuller Plan MD Lamont Snowball T    Anal fissure    Anginal pain ([place])    Asthma    Depression    Diabetes mellitus without complication ([place])    Dyspnea    Dysrhythmia    Headache(784.0)    HEMATOCHEZIA 04/17/2009   Qualifier: Diagnosis of  By: Fuller Plan MD Lamont Snowball T    Hyperlipidemia    Hypertension    IBS (irritable bowel syndrome)    Migraine headache 11/06/2008   Qualifier: Diagnosis of  By: Deborra Medina MD, Talia     OVARIAN CYST, RIGHT 02/21/2009   Qualifier: Diagnosis of  By: Deborra Medina MD, Talia     PALPITATIONS, OCCASIONAL 04/17/2010   Qualifier: Diagnosis of  By: Deborra Medina MD, Talia     Pancreatitis 10/14/2017   Past Surgical History:  Procedure Laterality Date   BREAST EXCISIONAL BIOPSY Left 1997   ? left side, done at time of reduction , benign   BREAST REDUCTION SURGERY     bilateral   CESAREAN SECTION     CHOLECYSTECTOMY     COLONOSCOPY     ESOPHAGOGASTRODUODENOSCOPY     ESOPHAGOGASTRODUODENOSCOPY (EGD) WITH PROPOFOL N/A 10/21/2020   Procedure: ESOPHAGOGASTRODUODENOSCOPY (EGD) WITH PROPOFOL;  Surgeon: Lesly Rubenstein, MD;  Location: [facility] ENDOSCOPY;   Service: Endoscopy;  Laterality: N/A;   REDUCTION MAMMAPLASTY Bilateral 1997   VAGINAL HYSTERECTOMY     Patient Active Problem List   Diagnosis Date Noted   Abnormal MRI, lumbar spine (01/29/2020) 02/19/2020   Anxiety due to MRI 01/25/2020   Claustrophobia associated to MRI 01/25/2020   Enthesopathy of knee region (Right) 01/25/2020   Enthesopathy of knee region (Left) 01/25/2020   Lumbar facet hypertrophy 01/25/2020   Lumbosacral radiculopathy at S1 (Left) 01/17/2020   Lumbosacral radiculopathy (S1) (Left) 01/17/2020   Chronic lower extremity pain (Bilateral) (L>R) 01/17/2020   History of pancreatitis 07/18/2019   Obstructive sleep apnea syndrome 01/02/2019   Lumbar facet arthropathy (Multilevel) (Bilateral) 12/14/2018   Osteoarthritis involving multiple joints 11/28/2018   Chronic hip pain (Left) 11/16/2018   Acute hip pain (Left) 11/16/2018   DDD (degenerative disc disease), lumbosacral 11/16/2018   Chronic hip pain (Secondary source of pain) (Bilateral) (L>R) 05/19/2017   Elevated C-reactive protein (CRP) 05/19/2017   Elevated sed rate 05/19/2017   Spondylosis without myelopathy or radiculopathy, lumbosacral region 05/19/2017   Pharmacologic therapy 05/06/2017   Disorder of skeletal system 05/06/2017   Problems influencing health status 05/06/2017   Chest pain with moderate risk of acute coronary syndrome 04/21/2017   DOE (  dyspnea on exertion) 04/21/2017   Intermittent palpitations 04/21/2017   Vitamin D insufficiency 05/04/2016   Depression 02/04/2016   Fatty liver 02/04/2016   Hypertension associated with diabetes ([place]) 02/04/2016   Chronic pain syndrome 01/15/2016   Acute low back pain 05/28/2015   Lumbar transverse process fracture (HCC) (Left L1) (03/11/15) 05/28/2015   Long term current use of opiate analgesic 05/01/2015   Long term prescription opiate use 05/01/2015   Opiate use (25 MME/Day) 05/01/2015   Encounter for therapeutic drug level monitoring 05/01/2015    Encounter for pain management planning 05/01/2015   Chronic low back pain (Primary Source of Pain) (Bilateral) (L>R) 05/01/2015   Lumbar facet syndrome (Bilateral) (L>R) 05/01/2015   Chronic knee pain [facility] source of pain) (Bilateral) (L>R) 05/01/2015   Lumbar spondylosis 05/01/2015   Neuropathic pain 05/01/2015   Neurogenic pain 05/01/2015   Myofascial pain 05/01/2015   Essential hypertriglyceridemia 04/26/2014   Type 2 diabetes mellitus with hyperglycemia, with long-term current use of insulin ([place]) 04/26/2014   Anxiety 05/15/2010   Obesity 05/15/2010   Hyperlipidemia 04/03/2010   Fatty liver disease 04/02/2010   Pure hypercholesterolemia 03/19/2010   GERD 04/17/2009   Constipation 04/17/2009   TOBACCO ABUSE 11/06/2008    ONSET DATE: 02/06/2021  REFERRING DIAG: Astrocytoma  THERAPY DIAG:  Muscle weakness (generalized)  Rationale for Evaluation and Treatment Rehabilitation  SUBJECTIVE:  Pt. Reports they are in the process of getting her medications clarified with the pharmacy, and MD office.   SUBJECTIVE STATEMENT: Pt. Reports that it is her daughter's birthday today. Pt accompanied by: significant other  PERTINENT HISTORY:  Pt. Is a 51 y.o. 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 PMHx includes: Anorexia, dehydration, Adult Failure to thrive, contracture of muscles multiple sites. Pt./caregiver reports recently being hospitalized with a UTI, and dehydration.   PRECAUTIONS: Fall  WEIGHT BEARING RESTRICTIONS No  PAIN:  Are you having pain?  Pt. Reports 3/10 pain in the right UE, and digits. Pt. intermittent facial, and verbal responses reflect increased pain levels.  FALLS: Has patient fallen in last 6 months? Yes. Number of falls Multiple Falls  LIVING ENVIRONMENT: Lives with: Family Lives in: House/apartment Stairs: Yes: External: 4 steps; bilateral but cannot reach  both Has following equipment at home: Single point cane, Walker - 4 wheeled, Hemi walker, shower chair, bed side commode, and Grab bars Lift chair in the tub, [place],  PLOF: Independent  PATIENT GOALS: To improve the right UE  OBJECTIVE:   HAND DOMINANCE: Right  ADLs: Transfers/ambulation related to ADLs: Eating: MaxA with cutting food, setting up, Modified I with nondominant Left hand, Silicon Mat  Grooming: Independent  UB Dressing: MaxA  LB Dressing: MaxA Toileting: Uses a BSCommode mInA transfers, MaxA clothing negotiation Bathing: MaxA Tub Shower transfers:  Uses a shower lift chair Equipment:  w/c, BSCommode, cane   IADLs: Shopping: Husband performs Light housekeeping: Max Meal Prep: MaxA Community mobility: Relies on family Medication management:Husband assists Doctor, hospital: Performs together Handwriting: Not legible  MOBILITY STATUS: Hx of falls    ACTIVITY TOLERANCE: Activity tolerance: limited  FUNCTIONAL OUTCOME MEASURES: FOTO: 11.76  UPPER EXTREMITY ROM  : WFL Left AROM/PROM   Passive ROM Right eval Right  08/18/21  Shoulder flexion 30   Shoulder abduction 40 62  Shoulder adduction    Shoulder extension    Shoulder internal rotation    Shoulder external rotation    Elbow flexion  120   Elbow extension -67   Wrist flexion Neutral   Wrist extension 12   Wrist ulnar deviation    Wrist radial deviation    Wrist pronation    Wrist supination    (Blank rows = not tested)   UPPER EXTREMITY MMT:     MMT Right eval Left eval  Shoulder flexion 0/5 4+/5  Shoulder abduction 0/5 4+/5  Shoulder adduction    Shoulder extension    Shoulder internal rotation    Shoulder external rotation    Middle trapezius    Lower trapezius    Elbow flexion 0/5 4+/5  Elbow extension 0/5 4+/5  Wrist flexion    Wrist extension 0/5 4+/5  Wrist ulnar deviation    Wrist radial deviation    Wrist pronation    Wrist supination    (Blank rows = not  tested)    SENSATION: Light touch: WFL  EDEMA:   Wrist: 18.5 cm MCP: 21.5 cm    MUSCLE TONE: RUE: Severe  COGNITION: Overall cognitive status: Impaired  VISION: Subjective report: Wears glasses Baseline vision: Wears glasses all the time Pt. Reports no changes    PRAXIS: Impaired: Motor planning     TODAY'S TREATMENT:   Pt. tolerated retrograde massage to the right hand, and digits secondary to edema. Pt. tolerated soft tissue massage to the muscles in the right scapular region. Manual therapy was perormed prior to, and in preparation for ROM, and therapeutic Ex. Pt. tolerated PROM for right wrist flexion and extension, digit flexion and extension, and thumb abduction. Pt./caregiver education was provided about  the  right hand palm protector, edema control techniques, ROM, positioning in right shoulder. Pt. Continues to tolerate the palm protector with 1" roll without skin irritation, or breakdown in the right palm. Pt. Does have an area of redness, and irritation on the lateral aspect of the 2nd digit from the thumb nail when she is not wearing the palm protector.   Pt. presents with a red area on the lateral aspect of her right 2nd digit from the thumb nail when pt. is not wearing the palm protector secondary to tight flexor synergy. Pt.'s right shoulder flexion, and abduction, and digit extension ROM continues to be  limited by pain, however pt. was able to tolerate increased shoulder abduction, and digit extension today. Pt reports 3/10 pain.  Pt. continues to be able to tolerate slightly more abduction today following moist heat pack modality to the right shoulder, and soft tissue massage to the scapular region. Pt. presents with increased flexor tightness in the right 2nd, and 3rd digits.  Pt. continues to initially maintain the digits on her right hand in tight flexor synergy pattern. Pt. was able to tolerate retrograde massage for edema control, and ROM to the right hand,  and digits. Pt. continues to work on improving pain, and improving RUE ROM in order to improve engagement with ADLs/IADLs, and promote good skin integrity through the palmar surface of the hand.   PATIENT EDUCATION: Education details: Right UE ROM, positioning of the RUE Person educated: Patient and Spouse Education method: Explanation, Demonstration, Tactile cues, and Verbal cues Education comprehension: verbalized understanding, returned demonstration, and needs further education   HOME EXERCISE PROGRAM:   Pt./caregiver education about positioning of the RUE, ROM    GOALS: Goals reviewed with patient? Yes  SHORT TERM GOALS: Target date: 09/15/2021    Pt./caregivers will demonstrate independence with HEPs for UE ROM, and edema control Baseline: 09/08/2021: Pt. Requires assist  for positioning, ROM, edema control. Eval: No current HEPs  Goal status: Ongoing    LONG TERM GOALS: Target date: 10/27/2021    Pt. Will improve right hand edema by 1 cm at the wrist, and MCPs Baseline: 09/08/2021: Wrist: 17.5 cm, MCPs: 20.5 cm Eval: Wrist: 18.5cm, MCPs: 21.5cm Goal status: Ongoing  2.  Pt. Will improve right shoulder PROM by 10 degrees with ADLs Baseline: 09/08/2021: Right shoulder flexion: 48, abduction: 53 Eval: PROM shoulder flexion: 30 degrees, Abduction: 40 degrees Goal status: Ongoing  3.  Pt. Will improve right elbow PROM by 10 degrees to prepare for hand to mouth patterns Baseline: 09/08/2021: elbow extension: -46, flexion: 110 Eval:   elbow extension -67, flexion 120 Goal status: Ongoing  4.  Pt. Will improve PROM right wrist flexion, and extension by 10 degrees Baseline: 09/08/2021: Wrist extension: 40Eval: wrist extension 12 degrees, flexion to neutral Goal status: Ongoing  5.  Pt. Will tolerate full digit extension in the right hand to promote good skin integrity through the palmar surface of the right hand  Baseline: 09/08/2021: Pt. Is able to tolerate PROM through 50% of  digit extension in the digits. Eval: Pt. Maintains digits in tight flexion Goal status: Ongoing  6.  Pt. Will require minA UE dressing using one armed dressing techniques. Baseline: 09/08/2021: MaxA Eval: MaxA Goal status: Ongoing  ASSESSMENT:  CLINICAL IMPRESSION:  Pt. presents with a red area on the lateral aspect of her right 2nd digit from the thumb nail when pt. is not wearing the palm protector secondary to tight flexor synergy. Pt.'s right shoulder flexion, and abduction, and digit extension ROM continues to be  limited by pain, however pt. was able to tolerate increased shoulder abduction, and digit extension today. Pt reports 3/10 pain.  Pt. continues to be able to tolerate slightly more abduction today following moist heat pack modality to the right shoulder, and soft tissue massage to the scapular region. Pt. presents with increased flexor tightness in the right 2nd, and 3rd digits.  Pt. continues to initially maintain the digits on her right hand in tight flexor synergy pattern. Pt. was able to tolerate retrograde massage for edema control, and ROM to the right hand, and digits. Pt. continues to work on improving pain, and improving RUE ROM in order to improve engagement with ADLs/IADLs, and promote good skin integrity through the palmar surface of the hand.  PERFORMANCE DEFICITS in functional skills including ADLs, IADLs, coordination, proprioception, sensation, edema, tone, ROM, strength, pain, FMC, GMC, balance, decreased knowledge of use of DME, skin integrity, and UE functional use, cognitive skills including attention, memory, and safety awareness, and psychosocial skills including coping strategies.   IMPAIRMENTS are limiting patient from ADLs, IADLs, education, work, leisure, and social participation.   COMORBIDITIES may have co-morbidities  that affects occupational performance. Patient will benefit from skilled OT to address above impairments and improve overall  function.  MODIFICATION OR ASSISTANCE TO COMPLETE EVALUATION: Maximum modification of tasks or assist with assess necessary to complete an evaluation.  OT OCCUPATIONAL PROFILE AND HISTORY: Detailed assessment: Review of records and additional review of physical, cognitive, psychosocial history related to current functional performance.  CLINICAL DECISION MAKING: High - multiple treatment options, significant modification of task necessary  REHAB POTENTIAL: Good for stated goals  EVALUATION COMPLEXITY: High    PLAN: OT FREQUENCY: 2x/week  OT DURATION: 12 weeks  PLANNED INTERVENTIONS: self care/ADL training, therapeutic exercise, therapeutic activity, neuromuscular re-education, manual therapy, passive range of motion, paraffin, moist heat,  contrast bath, patient/family education, cognitive remediation/compensation, energy conservation, and coping strategies training  RECOMMENDED OTHER SERVICES: PT, and ST  CONSULTED AND AGREED WITH PLAN OF CARE: Patient and family member/caregiver  PLAN FOR NEXT SESSION: Initiate PROM, and RUE Edema control techniques   Olegario Messier, MS, OTR/L  Olegario Messier, OT 10/02/2021, 5:40 PM           OUTPATIENT OCCUPATIONAL THERAPY TREATMENT  Patient Name: Kelly Rollins MRN: [id number] DOB:05-18-1970, 51 y.o., female Today's Date: 10/02/2021  PCP: Wilford Corner, PA-C REFERRING PROVIDER: Etta Quill, Corrine Larita Fife, PA-C   OT End of Session - 10/02/21 1738     Visit Number 13    Number of Visits 24    Date for OT Re-Evaluation 10/27/21    Authorization Time Period Progress reports period starting 08/04/2021    OT Start Time 1515    OT Stop Time 1600    OT Time Calculation (min) 45 min    Activity Tolerance Patient tolerated treatment well    Behavior During Therapy [facility] for tasks assessed/performed             Past Medical History:  Diagnosis Date   ABDOMINAL PAIN OTHER SPECIFIED SITE 02/21/2009   Qualifier:  Diagnosis of  By: Dayton Martes MD, Talia     ABDOMINAL PAIN-LUQ 04/17/2009   Qualifier: Diagnosis of  By: Russella Dar MD Bronson Curb T    Anal fissure    Anginal pain (HCC)    Asthma    Depression    Diabetes mellitus without complication (HCC)    Dyspnea    Dysrhythmia    Headache(784.0)    HEMATOCHEZIA 04/17/2009   Qualifier: Diagnosis of  By: Russella Dar MD Bronson Curb T    Hyperlipidemia    Hypertension    IBS (irritable bowel syndrome)    Migraine headache 11/06/2008   Qualifier: Diagnosis of  By: Dayton Martes MD, Talia     OVARIAN CYST, RIGHT 02/21/2009   Qualifier: Diagnosis of  By: Dayton Martes MD, Talia     PALPITATIONS, OCCASIONAL 04/17/2010   Qualifier: Diagnosis of  By: Dayton Martes MD, Talia     Pancreatitis 10/14/2017   Past Surgical History:  Procedure Laterality Date   BREAST EXCISIONAL BIOPSY Left 1997   ? left side, done at time of reduction , benign   BREAST REDUCTION SURGERY     bilateral   CESAREAN SECTION     CHOLECYSTECTOMY     COLONOSCOPY     ESOPHAGOGASTRODUODENOSCOPY     ESOPHAGOGASTRODUODENOSCOPY (EGD) WITH PROPOFOL N/A 10/21/2020   Procedure: ESOPHAGOGASTRODUODENOSCOPY (EGD) WITH PROPOFOL;  Surgeon: Regis Bill, MD;  Location: [facility] ENDOSCOPY;  Service: Endoscopy;  Laterality: N/A;   REDUCTION MAMMAPLASTY Bilateral 1997   VAGINAL HYSTERECTOMY     Patient Active Problem List   Diagnosis Date Noted   Abnormal MRI, lumbar spine (01/29/2020) 02/19/2020   Anxiety due to MRI 01/25/2020   Claustrophobia associated to MRI 01/25/2020   Enthesopathy of knee region (Right) 01/25/2020   Enthesopathy of knee region (Left) 01/25/2020   Lumbar facet hypertrophy 01/25/2020   Lumbosacral radiculopathy at S1 (Left) 01/17/2020   Lumbosacral radiculopathy (S1) (Left) 01/17/2020   Chronic lower extremity pain (Bilateral) (L>R) 01/17/2020   History of pancreatitis 07/18/2019   Obstructive sleep apnea syndrome 01/02/2019   Lumbar facet arthropathy (Multilevel) (Bilateral) 12/14/2018    Osteoarthritis involving multiple joints 11/28/2018   Chronic hip pain (Left) 11/16/2018   Acute hip pain (Left) 11/16/2018   DDD (degenerative disc disease), lumbosacral 11/16/2018  Chronic hip pain (Secondary source of pain) (Bilateral) (L>R) 05/19/2017   Elevated C-reactive protein (CRP) 05/19/2017   Elevated sed rate 05/19/2017   Spondylosis without myelopathy or radiculopathy, lumbosacral region 05/19/2017   Pharmacologic therapy 05/06/2017   Disorder of skeletal system 05/06/2017   Problems influencing health status 05/06/2017   Chest pain with moderate risk of acute coronary syndrome 04/21/2017   DOE (dyspnea on exertion) 04/21/2017   Intermittent palpitations 04/21/2017   Vitamin D insufficiency 05/04/2016   Depression 02/04/2016   Fatty liver 02/04/2016   Hypertension associated with diabetes ([place]) 02/04/2016   Chronic pain syndrome 01/15/2016   Acute low back pain 05/28/2015   Lumbar transverse process fracture (HCC) (Left L1) (03/11/15) 05/28/2015   Long term current use of opiate analgesic 05/01/2015   Long term prescription opiate use 05/01/2015   Opiate use (25 MME/Day) 05/01/2015   Encounter for therapeutic drug level monitoring 05/01/2015   Encounter for pain management planning 05/01/2015   Chronic low back pain (Primary Source of Pain) (Bilateral) (L>R) 05/01/2015   Lumbar facet syndrome (Bilateral) (L>R) 05/01/2015   Chronic knee pain [facility] source of pain) (Bilateral) (L>R) 05/01/2015   Lumbar spondylosis 05/01/2015   Neuropathic pain 05/01/2015   Neurogenic pain 05/01/2015   Myofascial pain 05/01/2015   Essential hypertriglyceridemia 04/26/2014   Type 2 diabetes mellitus with hyperglycemia, with long-term current use of insulin ([place]) 04/26/2014   Anxiety 05/15/2010   Obesity 05/15/2010   Hyperlipidemia 04/03/2010   Fatty liver disease 04/02/2010   Pure hypercholesterolemia 03/19/2010   GERD 04/17/2009   Constipation 04/17/2009   TOBACCO ABUSE  11/06/2008    ONSET DATE: 02/06/2021  REFERRING DIAG: Astrocytoma  THERAPY DIAG:  Muscle weakness (generalized)  Rationale for Evaluation and Treatment Rehabilitation  SUBJECTIVE:  Pt. Reports they are in the process of getting her medications clarified with the pharmacy, and MD office.   SUBJECTIVE STATEMENT: Pt. Reports that it is her daughter's birthday today. Pt accompanied by: significant other  PERTINENT HISTORY:  Pt. Is a 51 y.o. 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 PMHx includes: Anorexia, dehydration, Adult Failure to thrive, contracture of muscles multiple sites. Pt./caregiver reports recently being hospitalized with a UTI, and dehydration.   PRECAUTIONS: Fall  WEIGHT BEARING RESTRICTIONS No  PAIN:  Are you having pain?  Pt. Reports 3/10 pain in the right UE, and digits. Pt. intermittent facial, and verbal responses reflect increased pain levels.  FALLS: Has patient fallen in last 6 months? Yes. Number of falls Multiple Falls  LIVING ENVIRONMENT: Lives with: Family Lives in: House/apartment Stairs: Yes: External: 4 steps; bilateral but cannot reach both Has following equipment at home: Single point cane, Walker - 4 wheeled, Hemi walker, shower chair, bed side commode, and Grab bars Lift chair in the tub, [place],  PLOF: Independent  PATIENT GOALS: To improve the right UE  OBJECTIVE:   HAND DOMINANCE: Right  ADLs: Transfers/ambulation related to ADLs: Eating: MaxA with cutting food, setting up, Modified I with nondominant Left hand, Silicon Mat  Grooming: Independent  UB Dressing: MaxA  LB Dressing: MaxA Toileting: Uses a BSCommode mInA transfers, MaxA clothing negotiation Bathing: MaxA Tub Shower transfers:  Uses a shower lift chair Equipment:  w/c, BSCommode, cane   IADLs: Shopping: Husband performs Light housekeeping: Max Meal Prep: MaxA Community  mobility: Relies on family Medication management:Husband assists Doctor, hospital: Performs together Handwriting: Not legible  MOBILITY STATUS: Hx of falls  ACTIVITY TOLERANCE: Activity tolerance: limited  FUNCTIONAL OUTCOME MEASURES: FOTO: 11.76  UPPER EXTREMITY ROM  : WFL Left AROM/PROM   Passive ROM Right eval Right  08/18/21  Shoulder flexion 30   Shoulder abduction 40 62  Shoulder adduction    Shoulder extension    Shoulder internal rotation    Shoulder external rotation    Elbow flexion 120   Elbow extension -67   Wrist flexion Neutral   Wrist extension 12   Wrist ulnar deviation    Wrist radial deviation    Wrist pronation    Wrist supination    (Blank rows = not tested)   UPPER EXTREMITY MMT:     MMT Right eval Left eval  Shoulder flexion 0/5 4+/5  Shoulder abduction 0/5 4+/5  Shoulder adduction    Shoulder extension    Shoulder internal rotation    Shoulder external rotation    Middle trapezius    Lower trapezius    Elbow flexion 0/5 4+/5  Elbow extension 0/5 4+/5  Wrist flexion    Wrist extension 0/5 4+/5  Wrist ulnar deviation    Wrist radial deviation    Wrist pronation    Wrist supination    (Blank rows = not tested)    SENSATION: Light touch: WFL  EDEMA:   Wrist: 18.5 cm MCP: 21.5 cm    MUSCLE TONE: RUE: Severe  COGNITION: Overall cognitive status: Impaired  VISION: Subjective report: Wears glasses Baseline vision: Wears glasses all the time Pt. Reports no changes    PRAXIS: Impaired: Motor planning     TODAY'S TREATMENT:   Pt. tolerated retrograde massage to the right hand, and digits secondary to edema. Pt. tolerated soft tissue massage to the muscles in the right scapular region. Manual therapy was perormed prior to, and in preparation for ROM, and therapeutic Ex. Pt. tolerated PROM for right wrist flexion and extension, digit flexion and extension, and thumb abduction. Pt./caregiver education was provided  about  the  right hand palm protector, edema control techniques, ROM, positioning in right shoulder. Pt. Continues to tolerate the palm protector with 1" roll without skin irritation, or breakdown in the right palm. Pt. Does have an area of redness, and irritation on the lateral aspect of the 2nd digit from the thumb nail when she is not wearing the palm protector.   Pt. presents with a red area on the lateral aspect of her right 2nd digit from the thumb nail when pt. is not wearing the palm protector secondary to tight flexor synergy. Pt.'s right shoulder flexion, and abduction, and digit extension ROM continues to be  limited by pain, however pt. was able to tolerate increased shoulder abduction, and digit extension today. Pt reports 3/10 pain.  Pt. continues to be able to tolerate slightly more abduction today following moist heat pack modality to the right shoulder, and soft tissue massage to the scapular region. Pt. presents with increased flexor tightness in the right 2nd, and 3rd digits.  Pt. continues to initially maintain the digits on her right hand in tight flexor synergy pattern. Pt. was able to tolerate retrograde massage for edema control, and ROM to the right hand, and digits. Pt. continues to work on improving pain, and improving RUE ROM in order to improve engagement with ADLs/IADLs, and promote good skin integrity through the palmar surface of the hand.   PATIENT EDUCATION: Education details: Right UE ROM, positioning of the RUE Person educated: Patient and Spouse Education method: Explanation, Demonstration, Tactile cues, and  Verbal cues Education comprehension: verbalized understanding, returned demonstration, and needs further education   HOME EXERCISE PROGRAM:   Pt./caregiver education about positioning of the RUE, ROM    GOALS: Goals reviewed with patient? Yes  SHORT TERM GOALS: Target date: 09/15/2021    Pt./caregivers with demonstrate independence with HEPs for UE ROM,  and edema control Baseline: No current HEPs Goal status: INITIAL    LONG TERM GOALS: Target date: 10/27/2021    Pt. Will improve right hand edema by 1 cm at the wrist, and MCPs Baseline: Eval: Wrist: 18.5cm, MCPs: 21.5cm Goal status: INITIAL  2.  Pt. Will improve right shoulder PROM by 10 degrees with ADLs Baseline: Eval: PROM shoulder flexion: 30 degrees, Abduction: 40 degrees Goal status: INITIAL  3.  Pt. Will improve right elbow PROM by 10 degrees to prepare for hand to mouth patterns Baseline: Eval:   elbow extension -67, flexion 120 Goal status: INITIAL  4.  Pt. Will improve PROM right wrist flexion, and extension by 10 degrees Baseline: Eval: wrist extension 12 degrees, flexion to neutral Goal status: INITIAL  5.  Pt. Will tolerate full digit extension in the right hand to promote good skin integrity through the palmar surface of the right hand  Baseline: Pt. Maintains digits in tight flexion Goal status: INITIAL  6.  Pt. Will require minA UE dressing using one armed dressing techniques. Baseline: MaxA Goal status: INITIAL  ASSESSMENT:  CLINICAL IMPRESSION:  Pt. presents with a red area on the lateral aspect of her right 2nd digit from the thumb nail when pt. is not wearing the palm protector secondary to tight flexor synergy. Pt.'s right shoulder flexion, and abduction, and digit extension ROM continues to be  limited by pain, however pt. was able to tolerate increased shoulder abduction, and digit extension today. Pt reports 3/10 pain.  Pt. continues to be able to tolerate slightly more abduction today following moist heat pack modality to the right shoulder, and soft tissue massage to the scapular region. Pt. presents with increased flexor tightness in the right 2nd, and 3rd digits.  Pt. continues to initially maintain the digits on her right hand in tight flexor synergy pattern. Pt. was able to tolerate retrograde massage for edema control, and ROM to the right hand,  and digits. Pt. continues to work on improving pain, and improving RUE ROM in order to improve engagement with ADLs/IADLs, and promote good skin integrity through the palmar surface of the hand.  PERFORMANCE DEFICITS in functional skills including ADLs, IADLs, coordination, proprioception, sensation, edema, tone, ROM, strength, pain, FMC, GMC, balance, decreased knowledge of use of DME, skin integrity, and UE functional use, cognitive skills including attention, memory, and safety awareness, and psychosocial skills including coping strategies.   IMPAIRMENTS are limiting patient from ADLs, IADLs, education, work, leisure, and social participation.   COMORBIDITIES may have co-morbidities  that affects occupational performance. Patient will benefit from skilled OT to address above impairments and improve overall function.  MODIFICATION OR ASSISTANCE TO COMPLETE EVALUATION: Maximum modification of tasks or assist with assess necessary to complete an evaluation.  OT OCCUPATIONAL PROFILE AND HISTORY: Detailed assessment: Review of records and additional review of physical, cognitive, psychosocial history related to current functional performance.  CLINICAL DECISION MAKING: High - multiple treatment options, significant modification of task necessary  REHAB POTENTIAL: Good for stated goals  EVALUATION COMPLEXITY: High    PLAN: OT FREQUENCY: 2x/week  OT DURATION: 12 weeks  PLANNED INTERVENTIONS: self care/ADL training, therapeutic exercise, therapeutic activity, neuromuscular  re-education, manual therapy, passive range of motion, paraffin, moist heat, contrast bath, patient/family education, cognitive remediation/compensation, energy conservation, and coping strategies training  RECOMMENDED OTHER SERVICES: PT, and ST  CONSULTED AND AGREED WITH PLAN OF CARE: Patient and family member/caregiver  PLAN FOR NEXT SESSION: Initiate PROM, and RUE Edema control techniques   Harrel Carina, MS,  OTR/L  Harrel Carina, OT 10/02/2021, 5:40 PM           OUTPATIENT OCCUPATIONAL THERAPY TREATMENT  Patient Name: Kelly Rollins MRN: [id number] DOB:Mar 14, 1970, 51 y.o., female Today's Date: 10/02/2021  PCP: [name], PA-C REFERRING PROVIDER: [name], Corrine Jeani Hawking, PA-C   OT End of Session - 10/02/21 1738     Visit Number 13    Number of Visits 24    Date for OT Re-Evaluation 10/27/21    Authorization Time Period Progress reports period starting 08/04/2021    OT Start Time 1515    OT Stop Time 1600    OT Time Calculation (min) 45 min    Activity Tolerance Patient tolerated treatment well    Behavior During Therapy [facility] for tasks assessed/performed             Past Medical History:  Diagnosis Date   ABDOMINAL PAIN OTHER SPECIFIED SITE 02/21/2009   Qualifier: Diagnosis of  By: Deborra Medina MD, Talia     ABDOMINAL PAIN-LUQ 04/17/2009   Qualifier: Diagnosis of  By: Fuller Plan MD Lamont Snowball T    Anal fissure    Anginal pain ([place])    Asthma    Depression    Diabetes mellitus without complication ([place])    Dyspnea    Dysrhythmia    Headache(784.0)    HEMATOCHEZIA 04/17/2009   Qualifier: Diagnosis of  By: Fuller Plan MD Lamont Snowball T    Hyperlipidemia    Hypertension    IBS (irritable bowel syndrome)    Migraine headache 11/06/2008   Qualifier: Diagnosis of  By: Deborra Medina MD, Talia     OVARIAN CYST, RIGHT 02/21/2009   Qualifier: Diagnosis of  By: Deborra Medina MD, Talia     PALPITATIONS, OCCASIONAL 04/17/2010   Qualifier: Diagnosis of  By: Deborra Medina MD, Talia     Pancreatitis 10/14/2017   Past Surgical History:  Procedure Laterality Date   BREAST EXCISIONAL BIOPSY Left 1997   ? left side, done at time of reduction , benign   BREAST REDUCTION SURGERY     bilateral   CESAREAN SECTION     CHOLECYSTECTOMY     COLONOSCOPY     ESOPHAGOGASTRODUODENOSCOPY     ESOPHAGOGASTRODUODENOSCOPY (EGD) WITH PROPOFOL N/A 10/21/2020   Procedure: ESOPHAGOGASTRODUODENOSCOPY (EGD) WITH  PROPOFOL;  Surgeon: Lesly Rubenstein, MD;  Location: [facility] ENDOSCOPY;  Service: Endoscopy;  Laterality: N/A;   REDUCTION MAMMAPLASTY Bilateral 1997   VAGINAL HYSTERECTOMY     Patient Active Problem List   Diagnosis Date Noted   Abnormal MRI, lumbar spine (01/29/2020) 02/19/2020   Anxiety due to MRI 01/25/2020   Claustrophobia associated to MRI 01/25/2020   Enthesopathy of knee region (Right) 01/25/2020   Enthesopathy of knee region (Left) 01/25/2020   Lumbar facet hypertrophy 01/25/2020   Lumbosacral radiculopathy at S1 (Left) 01/17/2020   Lumbosacral radiculopathy (S1) (Left) 01/17/2020   Chronic lower extremity pain (Bilateral) (L>R) 01/17/2020   History of pancreatitis 07/18/2019   Obstructive sleep apnea syndrome 01/02/2019   Lumbar facet arthropathy (Multilevel) (Bilateral) 12/14/2018   Osteoarthritis involving multiple joints 11/28/2018   Chronic hip pain (Left) 11/16/2018   Acute hip pain (Left) 11/16/2018  DDD (degenerative disc disease), lumbosacral 11/16/2018   Chronic hip pain (Secondary source of pain) (Bilateral) (L>R) 05/19/2017   Elevated C-reactive protein (CRP) 05/19/2017   Elevated sed rate 05/19/2017   Spondylosis without myelopathy or radiculopathy, lumbosacral region 05/19/2017   Pharmacologic therapy 05/06/2017   Disorder of skeletal system 05/06/2017   Problems influencing health status 05/06/2017   Chest pain with moderate risk of acute coronary syndrome 04/21/2017   DOE (dyspnea on exertion) 04/21/2017   Intermittent palpitations 04/21/2017   Vitamin D insufficiency 05/04/2016   Depression 02/04/2016   Fatty liver 02/04/2016   Hypertension associated with diabetes ([place]) 02/04/2016   Chronic pain syndrome 01/15/2016   Acute low back pain 05/28/2015   Lumbar transverse process fracture (HCC) (Left L1) (03/11/15) 05/28/2015   Long term current use of opiate analgesic 05/01/2015   Long term prescription opiate use 05/01/2015   Opiate use (25 MME/Day)  05/01/2015   Encounter for therapeutic drug level monitoring 05/01/2015   Encounter for pain management planning 05/01/2015   Chronic low back pain (Primary Source of Pain) (Bilateral) (L>R) 05/01/2015   Lumbar facet syndrome (Bilateral) (L>R) 05/01/2015   Chronic knee pain [facility] source of pain) (Bilateral) (L>R) 05/01/2015   Lumbar spondylosis 05/01/2015   Neuropathic pain 05/01/2015   Neurogenic pain 05/01/2015   Myofascial pain 05/01/2015   Essential hypertriglyceridemia 04/26/2014   Type 2 diabetes mellitus with hyperglycemia, with long-term current use of insulin ([place]) 04/26/2014   Anxiety 05/15/2010   Obesity 05/15/2010   Hyperlipidemia 04/03/2010   Fatty liver disease 04/02/2010   Pure hypercholesterolemia 03/19/2010   GERD 04/17/2009   Constipation 04/17/2009   TOBACCO ABUSE 11/06/2008    ONSET DATE: 02/06/2021  REFERRING DIAG: Astrocytoma  THERAPY DIAG:  Muscle weakness (generalized)  Rationale for Evaluation and Treatment Rehabilitation  SUBJECTIVE:  Pt. Reports they are in the process of getting her medications clarified with the pharmacy, and MD office.   SUBJECTIVE STATEMENT:  Pt. Reports that she turns 51 this weekend.  Pt accompanied by: significant other  PERTINENT HISTORY:  Pt. Is a 51 y.o. 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 PMHx includes: Anorexia, dehydration, Adult Failure to thrive, contracture of muscles multiple sites. Pt./caregiver reports recently being hospitalized with a UTI, and dehydration.   PRECAUTIONS: Fall  WEIGHT BEARING RESTRICTIONS No  PAIN:  Are you having pain?  Pt. Reports 3/10 pain in the right wrist  FALLS: Has patient fallen in last 6 months? Yes. Number of falls Multiple Falls  LIVING ENVIRONMENT: Lives with: Family Lives in: House/apartment Stairs: Yes: External: 4 steps; bilateral but cannot reach both Has following  equipment at home: Single point cane, Walker - 4 wheeled, Hemi walker, shower chair, bed side commode, and Grab bars Lift chair in the tub, [place],  PLOF: Independent  PATIENT GOALS: To improve the right UE  OBJECTIVE:   HAND DOMINANCE: Right  ADLs: Transfers/ambulation related to ADLs: Eating: MaxA with cutting food, setting up, Modified I with nondominant Left hand, [place]  Grooming: Independent  UB Dressing: MaxA  LB Dressing: MaxA Toileting: Uses a BSCommode mInA transfers, MaxA clothing negotiation Bathing: MaxA Tub Shower transfers:  Uses a shower lift chair Equipment:  w/c, BSCommode, cane   IADLs: Shopping: Husband performs Light housekeeping: Max Meal Prep: MaxA Community mobility: Relies on family Medication management:Husband assists Doctor, hospital: Performs together Handwriting: Not legible  MOBILITY STATUS: Hx of falls  ACTIVITY TOLERANCE: Activity tolerance: limited  FUNCTIONAL OUTCOME MEASURES: FOTO: 11.76  UPPER EXTREMITY ROM  : WFL Left AROM/PROM   Passive ROM Right eval Right  08/18/21 Right 09/10/21  Shoulder flexion 30  61  Shoulder abduction 40 62 66  Shoulder adduction     Shoulder extension     Shoulder internal rotation     Shoulder external rotation     Elbow flexion 120    Elbow extension -67    Wrist flexion Neutral    Wrist extension 12    Wrist ulnar deviation     Wrist radial deviation     Wrist pronation     Wrist supination     (Blank rows = not tested)   UPPER EXTREMITY MMT:     MMT Right eval Left eval  Shoulder flexion 0/5 4+/5  Shoulder abduction 0/5 4+/5  Shoulder adduction    Shoulder extension    Shoulder internal rotation    Shoulder external rotation    Middle trapezius    Lower trapezius    Elbow flexion 0/5 4+/5  Elbow extension 0/5 4+/5  Wrist flexion    Wrist extension 0/5 4+/5  Wrist ulnar deviation    Wrist radial deviation    Wrist pronation    Wrist supination    (Blank rows  = not tested)    SENSATION: Light touch: WFL  EDEMA:   Wrist: 18.5 cm MCP: 21.5 cm    MUSCLE TONE: RUE: Severe  COGNITION: Overall cognitive status: Impaired  VISION: Subjective report: Wears glasses Baseline vision: Wears glasses all the time Pt. Reports no changes    PRAXIS: Impaired: Motor planning     TODAY'S TREATMENT:   Pt. tolerated retrograde massage to the right hand, and digits secondary to edema. Pt. tolerated soft tissue massage to the muscles in the right scapular region. Manual therapy was performed prior to, and in preparation for ROM, and therapeutic Ex. Pt. tolerated PROM  for right digit flexion and extension, and thumb abduction. Pt./caregiver education was provided about  the  right hand palm protector, edema control techniques, ROM, positioning in right shoulder. Pt. tolerated the palm protector without a 1" foam roll . Pt. education was provided about positioning the right UE secondary to right elbow pressure soreness.   Pt. has started chemotherapy since the last session. Pt. presented with increased tone, and flexor tightness in the RUE, and hand. Pt continues to report 3/10 pain. Pt. Reports a new onset of persistent right wrist pain since a recent fall. Pt was to tolerate ROM to tolerate gentle ROM to the right wrist, however was unable to tolerate forearm supination with pain and tenderness at the wrist. Pt.'s right wrist is hypersensitive to touch, which reflects a new onset of pain. Discussed with the Pt., and husband the recommendation to have the right wrist assessed by her physician. Pt. continues to initially maintain the digits on her right hand in tight flexor synergy pattern. Pt. continues to work on improving pain, and improving RUE ROM in order to improve engagement with ADLs/IADLs, and promote good skin integrity through the palmar surface of the hand.   PATIENT EDUCATION: Education details: Right UE ROM, positioning of the RUE Person  educated: Patient and Spouse Education method: Explanation, Demonstration, Tactile cues, and Verbal cues Education comprehension: verbalized understanding, returned demonstration, and needs further education   HOME EXERCISE PROGRAM:   Pt./caregiver education about positioning of the RUE, ROM    GOALS: Goals reviewed with patient? Yes  SHORT TERM GOALS: Target date: 09/15/2021    Pt./caregivers with demonstrate independence with HEPs for UE ROM, and edema control Baseline: No current HEPs Goal status: INITIAL    LONG TERM GOALS: Target date: 10/27/2021    Pt. Will improve right hand edema by 1 cm at the wrist, and MCPs Baseline: Eval: Wrist: 18.5cm, MCPs: 21.5cm Goal status: INITIAL  2.  Pt. Will improve right shoulder PROM by 10 degrees with ADLs Baseline: Eval: PROM shoulder flexion: 30 degrees, Abduction: 40 degrees Goal status: INITIAL  3.  Pt. Will improve right elbow PROM by 10 degrees to prepare for hand to mouth patterns Baseline: Eval:   elbow extension -67, flexion 120 Goal status: INITIAL  4.  Pt. Will improve PROM right wrist flexion, and extension by 10 degrees Baseline: Eval: wrist extension 12 degrees, flexion to neutral Goal status: INITIAL  5.  Pt. Will tolerate full digit extension in the right hand to promote good skin integrity through the palmar surface of the right hand  Baseline: Pt. Maintains digits in tight flexion Goal status: INITIAL  6.  Pt. Will require minA UE dressing using one armed dressing techniques. Baseline: MaxA Goal status: INITIAL  ASSESSMENT:  CLINICAL IMPRESSION:  Pt. has started chemotherapy since the last session. Pt. presented with increased tone, and flexor tightness in the RUE, and hand. Pt continues to report 3/10 pain. Pt. Reports a new onset of persistent right wrist pain since a recent fall. Pt was to tolerate ROM to tolerate gentle ROM to the right wrist, however was unable to tolerate forearm supination with pain  and tenderness at the wrist. Pt.'s right wrist is hypersensitive to touch, which reflects a new onset of pain. Discussed with the Pt., and husband the recommendation to have the right wrist assessed by her physician. Pt. continues to initially maintain the digits on her right hand in tight flexor synergy pattern. Pt. continues to work on improving pain, and improving RUE ROM in order to improve engagement with ADLs/IADLs, and promote good skin integrity through the palmar surface of the hand.    PERFORMANCE DEFICITS in functional skills including ADLs, IADLs, coordination, proprioception, sensation, edema, tone, ROM, strength, pain, FMC, GMC, balance, decreased knowledge of use of DME, skin integrity, and UE functional use, cognitive skills including attention, memory, and safety awareness, and psychosocial skills including coping strategies.   IMPAIRMENTS are limiting patient from ADLs, IADLs, education, work, leisure, and social participation.   COMORBIDITIES may have co-morbidities  that affects occupational performance. Patient will benefit from skilled OT to address above impairments and improve overall function.  MODIFICATION OR ASSISTANCE TO COMPLETE EVALUATION: Maximum modification of tasks or assist with assess necessary to complete an evaluation.  OT OCCUPATIONAL PROFILE AND HISTORY: Detailed assessment: Review of records and additional review of physical, cognitive, psychosocial history related to current functional performance.  CLINICAL DECISION MAKING: High - multiple treatment options, significant modification of task necessary  REHAB POTENTIAL: Good for stated goals  EVALUATION COMPLEXITY: High    PLAN: OT FREQUENCY: 2x/week  OT DURATION: 12 weeks  PLANNED INTERVENTIONS: self care/ADL training, therapeutic exercise, therapeutic activity, neuromuscular re-education, manual therapy, passive range of motion, paraffin, moist heat, contrast bath, patient/family education,  cognitive remediation/compensation, energy conservation, and coping strategies training  RECOMMENDED OTHER SERVICES: PT, and ST  CONSULTED AND AGREED WITH PLAN OF CARE: Patient and family member/caregiver  PLAN FOR NEXT SESSION: Initiate PROM, and RUE Edema control techniques   Harrel Carina, MS, OTR/L  Harrel Carina, OT  10/02/2021, 5:40 PM

## 2021-10-02 NOTE — Therapy (Signed)
OUTPATIENT SPEECH LANGUAGE PATHOLOGY TREATMENT NOTE   Patient Name: Kelly Rollins MRN: [id number] DOB:12-20-1970, 51 y.o., female Today's Date: 10/02/2021  PCP: Grayland Ormond, PA REFERRING PROVIDER: Margarita Grizzle, NP  END OF SESSION:   End of Session - 10/02/21 1411     Visit Number 7    Number of Visits 25    Date for SLP Re-Evaluation 11/13/21    Authorization Type [employer]    Progress Note Due on Visit 10    SLP Start Time 1400    SLP Stop Time  1500    SLP Time Calculation (min) 60 min    Activity Tolerance Patient tolerated treatment well             Past Medical History:  Diagnosis Date   ABDOMINAL PAIN OTHER SPECIFIED SITE 02/21/2009   Qualifier: Diagnosis of  By: Deborra Medina MD, Talia     ABDOMINAL PAIN-LUQ 04/17/2009   Qualifier: Diagnosis of  By: Fuller Plan MD Lamont Snowball T    Anal fissure    Anginal pain ([place])    Asthma    Depression    Diabetes mellitus without complication ([place])    Dyspnea    Dysrhythmia    Headache(784.0)    HEMATOCHEZIA 04/17/2009   Qualifier: Diagnosis of  By: Fuller Plan MD Lamont Snowball T    Hyperlipidemia    Hypertension    IBS (irritable bowel syndrome)    Migraine headache 11/06/2008   Qualifier: Diagnosis of  By: Deborra Medina MD, Talia     OVARIAN CYST, RIGHT 02/21/2009   Qualifier: Diagnosis of  By: Deborra Medina MD, Talia     PALPITATIONS, OCCASIONAL 04/17/2010   Qualifier: Diagnosis of  By: Deborra Medina MD, Talia     Pancreatitis 10/14/2017   Past Surgical History:  Procedure Laterality Date   BREAST EXCISIONAL BIOPSY Left 1997   ? left side, done at time of reduction , benign   BREAST REDUCTION SURGERY     bilateral   CESAREAN SECTION     CHOLECYSTECTOMY     COLONOSCOPY     ESOPHAGOGASTRODUODENOSCOPY     ESOPHAGOGASTRODUODENOSCOPY (EGD) WITH PROPOFOL N/A 10/21/2020   Procedure: ESOPHAGOGASTRODUODENOSCOPY (EGD) WITH PROPOFOL;  Surgeon: Lesly Rubenstein, MD;  Location: [facility] ENDOSCOPY;  Service: Endoscopy;  Laterality:  N/A;   REDUCTION MAMMAPLASTY Bilateral 1997   VAGINAL HYSTERECTOMY     Patient Active Problem List   Diagnosis Date Noted   Abnormal MRI, lumbar spine (01/29/2020) 02/19/2020   Anxiety due to MRI 01/25/2020   Claustrophobia associated to MRI 01/25/2020   Enthesopathy of knee region (Right) 01/25/2020   Enthesopathy of knee region (Left) 01/25/2020   Lumbar facet hypertrophy 01/25/2020   Lumbosacral radiculopathy at S1 (Left) 01/17/2020   Lumbosacral radiculopathy (S1) (Left) 01/17/2020   Chronic lower extremity pain (Bilateral) (L>R) 01/17/2020   History of pancreatitis 07/18/2019   Obstructive sleep apnea syndrome 01/02/2019   Lumbar facet arthropathy (Multilevel) (Bilateral) 12/14/2018   Osteoarthritis involving multiple joints 11/28/2018   Chronic hip pain (Left) 11/16/2018   Acute hip pain (Left) 11/16/2018   DDD (degenerative disc disease), lumbosacral 11/16/2018   Chronic hip pain (Secondary source of pain) (Bilateral) (L>R) 05/19/2017   Elevated C-reactive protein (CRP) 05/19/2017   Elevated sed rate 05/19/2017   Spondylosis without myelopathy or radiculopathy, lumbosacral region 05/19/2017   Pharmacologic therapy 05/06/2017   Disorder of skeletal system 05/06/2017   Problems influencing health status 05/06/2017   Chest pain with moderate risk of acute coronary syndrome 04/21/2017   DOE (  dyspnea on exertion) 04/21/2017   Intermittent palpitations 04/21/2017   Vitamin D insufficiency 05/04/2016   Depression 02/04/2016   Fatty liver 02/04/2016   Hypertension associated with diabetes ([place]) 02/04/2016   Chronic pain syndrome 01/15/2016   Acute low back pain 05/28/2015   Lumbar transverse process fracture (HCC) (Left L1) (03/11/15) 05/28/2015   Long term current use of opiate analgesic 05/01/2015   Long term prescription opiate use 05/01/2015   Opiate use (25 MME/Day) 05/01/2015   Encounter for therapeutic drug level monitoring 05/01/2015   Encounter for pain management  planning 05/01/2015   Chronic low back pain (Primary Source of Pain) (Bilateral) (L>R) 05/01/2015   Lumbar facet syndrome (Bilateral) (L>R) 05/01/2015   Chronic knee pain [facility] source of pain) (Bilateral) (L>R) 05/01/2015   Lumbar spondylosis 05/01/2015   Neuropathic pain 05/01/2015   Neurogenic pain 05/01/2015   Myofascial pain 05/01/2015   Essential hypertriglyceridemia 04/26/2014   Type 2 diabetes mellitus with hyperglycemia, with long-term current use of insulin ([place]) 04/26/2014   Anxiety 05/15/2010   Obesity 05/15/2010   Hyperlipidemia 04/03/2010   Fatty liver disease 04/02/2010   Pure hypercholesterolemia 03/19/2010   GERD 04/17/2009   Constipation 04/17/2009   TOBACCO ABUSE 11/06/2008    ONSET DATE: 02/14/2021 date of initial dx; 08/19/2021 date of referral      REFERRING DIAG: R47.01 (ICD-10-CM) - Aphasia   PERTINENT HISTORY: PMH T2DM and 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 Colin Mulders in 02/14/21 s/p radiation and TMZ.    DIAGNOSTIC FINDINGS: MRI 07/10/2021 Within limitations of motion artifact, no significant difference in left frontal based mass crossing midline through the corpus callosum. On conventional T1 postcontrast imaging, the previous areas of hyper enhancement appear to have diminished some degree. No definite areas of new involvement. Redemonstrated left frontal stereotactic biopsy tract.   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  Yes, monitored with repositioning  PATIENT GOALS: to be able to speak fluently  OBJECTIVE:   TODAY'S TREATMENT: Skilled treatment session focused on pt's expressive language goals. SLP facilitated session by  providing the following interventions:  [employer] Therapy app utilized: Sentence Scramble: Level 2 - more than a reasonable amount of time to complete Level 4: Min A to achieve 80% Sentence Completion: Level 2 - 90%      PATIENT EDUCATION: Education details: tasks to facilitate word finding Person educated: Patient and Spouse Education method: Explanation, Demonstration, and Verbal cues Education comprehension: verbalized understanding and needs further education  GOALS: Goals reviewed with patient? Yes  SHORT TERM GOALS: Target date: 10 sessions   Given basic category, pt will generate at least 6 members in 7 out of 10 opportunities with minimal cues.  Baseline: 3 members in basic category Goal status: INITIAL   2.  Pt will demonstrate use of at least 1 word finding strategy to aid in communication breakdowns across 5 sessions with minimal cues.  Baseline: introduced concept 08/20/2021 Goal status: INITIAL   3.  Pt will expressively produce 4+ word sentence in response to situation/picture with 70% accuracy given minimal cues in order to increase ability to communicate basic wants needs. Baseline: 3 word Goal status: INITIAL     LONG TERM GOALS: Target date: 11/12/2021  Pt and caregiver will utilize word finding strategies to communicate pt's basic wants/needs in 5 out of 10 opportunities.  Baseline: introduced 08/20/2021 Goal status: INITIAL    ASSESSMENT:  CLINICAL IMPRESSION: Pt continues to present with moderate word finding and thought completion impairments. Pt frequently stops mid-thought with inability to continue her thought. At this time, doubtful that communication strategies is helpful to utilize during moments of breakdown as pt is largely unable to formulate a complete thought and sheis frequently overwhelmed by her husband's questions. At this time, recommend continued skilled ST to develop a maintenance HEP.   OBJECTIVE IMPAIRMENTS include attention,  expressive language, and aphasia. These impairments are limiting patient from managing medications, managing appointments, managing finances, household responsibilities, ADLs/IADLs, and effectively communicating at home and in community. Factors affecting potential to achieve goals and functional outcome are  astrocytoma . Patient will benefit from skilled SLP services to address above impairments and improve overall function.  REHAB POTENTIAL: Fair ; astrocytomia  PLAN: SLP FREQUENCY: 1-2x/week  SLP DURATION: 12 weeks  PLANNED INTERVENTIONS: Language facilitation, Functional tasks, SLP instruction and feedback, and Patient/family education  Myrla Malanowski B. Rutherford Nail, M.S., CCC-SLP, Mining engineer Certified Brain Injury [place]  [place] Office [phone] Ascom [phone] Fax [phone]

## 2021-10-02 NOTE — Therapy (Signed)
OUTPATIENT PHYSICAL THERAPY NEURO EVALUATION   Patient Name: Kelly Rollins MRN: [id number] DOB:31-Dec-1970, 51 y.o., female Today's Date: 10/02/2021   PCP: [name], PA-C REFERRING PROVIDER: [name], Bonnell Public, PA-C   PT End of Session - 10/02/21 1605     Visit Number 2    Number of Visits 25    Date for PT Re-Evaluation 12/09/21    PT Start Time 2244    PT Stop Time 1645    PT Time Calculation (min) 40 min    Equipment Utilized During Treatment Gait belt    Activity Tolerance Patient tolerated treatment well    Behavior During Therapy WFL for tasks assessed/performed             Past Medical History:  Diagnosis Date   ABDOMINAL PAIN OTHER SPECIFIED SITE 02/21/2009   Qualifier: Diagnosis of  By: Deborra Medina MD, Talia     ABDOMINAL PAIN-LUQ 04/17/2009   Qualifier: Diagnosis of  By: Fuller Plan MD Lamont Snowball T    Anal fissure    Anginal pain ([place])    Asthma    Depression    Diabetes mellitus without complication ([place])    Dyspnea    Dysrhythmia    Headache(784.0)    HEMATOCHEZIA 04/17/2009   Qualifier: Diagnosis of  By: Fuller Plan MD Lamont Snowball T    Hyperlipidemia    Hypertension    IBS (irritable bowel syndrome)    Migraine headache 11/06/2008   Qualifier: Diagnosis of  By: Deborra Medina MD, Talia     OVARIAN CYST, RIGHT 02/21/2009   Qualifier: Diagnosis of  By: Deborra Medina MD, Talia     PALPITATIONS, OCCASIONAL 04/17/2010   Qualifier: Diagnosis of  By: Deborra Medina MD, Talia     Pancreatitis 10/14/2017   Past Surgical History:  Procedure Laterality Date   BREAST EXCISIONAL BIOPSY Left 1997   ? left side, done at time of reduction , benign   BREAST REDUCTION SURGERY     bilateral   CESAREAN SECTION     CHOLECYSTECTOMY     COLONOSCOPY     ESOPHAGOGASTRODUODENOSCOPY     ESOPHAGOGASTRODUODENOSCOPY (EGD) WITH PROPOFOL N/A 10/21/2020   Procedure: ESOPHAGOGASTRODUODENOSCOPY (EGD) WITH PROPOFOL;  Surgeon: Lesly Rubenstein, MD;  Location: [facility] ENDOSCOPY;  Service:  Endoscopy;  Laterality: N/A;   REDUCTION MAMMAPLASTY Bilateral 1997   VAGINAL HYSTERECTOMY     Patient Active Problem List   Diagnosis Date Noted   Abnormal MRI, lumbar spine (01/29/2020) 02/19/2020   Anxiety due to MRI 01/25/2020   Claustrophobia associated to MRI 01/25/2020   Enthesopathy of knee region (Right) 01/25/2020   Enthesopathy of knee region (Left) 01/25/2020   Lumbar facet hypertrophy 01/25/2020   Lumbosacral radiculopathy at S1 (Left) 01/17/2020   Lumbosacral radiculopathy (S1) (Left) 01/17/2020   Chronic lower extremity pain (Bilateral) (L>R) 01/17/2020   History of pancreatitis 07/18/2019   Obstructive sleep apnea syndrome 01/02/2019   Lumbar facet arthropathy (Multilevel) (Bilateral) 12/14/2018   Osteoarthritis involving multiple joints 11/28/2018   Chronic hip pain (Left) 11/16/2018   Acute hip pain (Left) 11/16/2018   DDD (degenerative disc disease), lumbosacral 11/16/2018   Chronic hip pain (Secondary source of pain) (Bilateral) (L>R) 05/19/2017   Elevated C-reactive protein (CRP) 05/19/2017   Elevated sed rate 05/19/2017   Spondylosis without myelopathy or radiculopathy, lumbosacral region 05/19/2017   Pharmacologic therapy 05/06/2017   Disorder of skeletal system 05/06/2017   Problems influencing health status 05/06/2017   Chest pain with moderate risk of acute coronary syndrome 04/21/2017  DOE (dyspnea on exertion) 04/21/2017   Intermittent palpitations 04/21/2017   Vitamin D insufficiency 05/04/2016   Depression 02/04/2016   Fatty liver 02/04/2016   Hypertension associated with diabetes ([place]) 02/04/2016   Chronic pain syndrome 01/15/2016   Acute low back pain 05/28/2015   Lumbar transverse process fracture (HCC) (Left L1) (03/11/15) 05/28/2015   Long term current use of opiate analgesic 05/01/2015   Long term prescription opiate use 05/01/2015   Opiate use (25 MME/Day) 05/01/2015   Encounter for therapeutic drug level monitoring 05/01/2015    Encounter for pain management planning 05/01/2015   Chronic low back pain (Primary Source of Pain) (Bilateral) (L>R) 05/01/2015   Lumbar facet syndrome (Bilateral) (L>R) 05/01/2015   Chronic knee pain [facility] source of pain) (Bilateral) (L>R) 05/01/2015   Lumbar spondylosis 05/01/2015   Neuropathic pain 05/01/2015   Neurogenic pain 05/01/2015   Myofascial pain 05/01/2015   Essential hypertriglyceridemia 04/26/2014   Type 2 diabetes mellitus with hyperglycemia, with long-term current use of insulin ([place]) 04/26/2014   Anxiety 05/15/2010   Obesity 05/15/2010   Hyperlipidemia 04/03/2010   Fatty liver disease 04/02/2010   Pure hypercholesterolemia 03/19/2010   GERD 04/17/2009   Constipation 04/17/2009   TOBACCO ABUSE 11/06/2008    ONSET DATE: January 2023  REFERRING DIAG: C71.9 (ICD-10-CM) - Malignant neoplasm of brain, unspecified  THERAPY DIAG:  Muscle weakness (generalized)  Unsteadiness on feet  Difficulty in walking, not elsewhere classified  Other lack of coordination  Cognitive communication deficit  Rationale for Evaluation and Treatment Rehabilitation  SUBJECTIVE:                                                                                                                                                                                              SUBJECTIVE STATEMENT: Patient reports recent fall and increased right arm/hand/wrist pain- Reports doing okay otherwise. States does not see Wound MD until around 9/10 or close to that date and bandaging her right calf and not currently using her AFO.  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 Pt's husband, [name], present with pt. [name] provides majority of hx as pt has difficulty with speech due to Broca's aphasia (working with SLP). Pt reports partial paralysis  of R side. She is unable to move her RUE, RLE also affected. Pt ambulates with hemiwalker in their home for short distances and does not use WC at home. She fatigues quickly with ambulation. Pt has AFO for RLE but unable to determine  how to donn it without it hurting her LE. Pt currently with wound on R calf covered by large bandage that they have been treating for at least a month (reports physician aware). In addition to wound on R calf, pt reports R side skin irritation under breast and lateral trunk due to bra underwire. They have since removed underwire. Pt was hospitalized 10 days in June due to UTI. She has had at least 12 falls in the last six months. Reports no injuries with falls, and states "just bumps and bruises." Pt reports she has constant head pain. Pt spouse reports pt has pain throughout her whole right side down to LE. Can have bad cramps in RUE and RLE. Reports no numbness/tingling. Occ dizziness, unable to describe further. Pt sleeps in recliner, wakes up stiff in the morning. PMH per chart includes: Anorexia, dehydration, Adult Failure to thrive, contracture of muscles multiple sites. Pt./caregiver reports recently being hospitalized with a UTI, and dehydration. Extensive hx, please refer to chart for full details.   PAIN:  Are you having pain? Yes: NPRS scale: not rated/10 Pain location: Top of head, front Pain description: States, "cancer pain" head pain Aggravating factors: unsure Relieving factors: medications  PRECAUTIONS: Fall  WEIGHT BEARING RESTRICTIONS No  FALLS: Has patient fallen in last 6 months? Yes. Number of falls 12  LIVING ENVIRONMENT: information primarily from chart Lives with: lives with their spouse Lives in: House/apartment Stairs: Yes: External: 4 steps; bilateral but cannot reach both Has following equipment at home: Single point cane, Walker - 4 wheeled, Hemi walker, shower chair, bed side commode, Grab bars, and lift chair  PLOF:  Independent  PATIENT GOALS  "Normal function" as much as possible. Improving her balance.  OBJECTIVE:   DIAGNOSTIC FINDINGS:   MR BRAIN W WO CONTRAST 02/06/2021:  "IMPRESSION: Masslike T2 signal in the anterior left frontal lobe, without associated enhancement, most concerning for a low-grade glioma. Additional areas of increased T2 signal in the left frontal lobe may also infiltrative low-grade glioma or associated edema."  CT HEAD WO CONTRAST (5MM) 02/06/2021: "IMPRESSION: High left anterior frontal edema with new adjacent masslike hyperdensity involving the left frontal white matter, high left left frontal cortex, and genu of corpus callosum. Overall, findings are suspicious for malignancy (including hypercellular tumors such as GBM and lymphoma). Acute or subacute left ACA territory infarct is a differential consideration, but thought less likely. Recommend MRI with contrast for further evaluation."   COGNITION: Overall cognitive status: Within functional limits for tasks assessed   SENSATION: Impaired in RLE - diminished and absent throughout R ankle/foot - noted wound on calf - LLE intact to light touch   COORDINATION:  Finger to chin with LUE - few errors otherwise able to complete. Unable to perform RUE.  EDEMA: noted B ankles   MUSCLE TONE: RLE: Mild-moderate. Pt unable to get R heel on floor in weightbearing position, plantarflexed   POSTURE: rounded shoulders, forward head, increased thoracic kyphosis, and flexed trunk   LOWER EXTREMITY MMT:     LLE grossly 4/5 throughout Trace activation RLE in chair with majority of muscles tested. RLE generally <3/5. Exception R hip abductors 3-/5, would benefit from further assessment in supine.   (Blank rows = not tested)  LOWER EXTREMITY MMT:   Impaired throughout RLE due to tone   TRANSFERS: Assistive device utilized: Hemi walker  Sit to stand:  Attempted 5xSTS in session, unable to complete but did perform 2x in  session with mod a.  Spouse  reports pt able to perform transfers with 20% help at home from chair of increased height Stand to sit: Mod Ax2   GAIT: Gait pattern: step to pattern, RLE fixed in PF, externally rotated, uses L hemiwalker Distance walked: 20 ft Assistive device utilized: Hemi walker Level of assistance:  close CGA Comments: heavy LUE weightbearing on HW  FUNCTIONAL TESTs:  5 times sit to stand: unable to complete from standard chair height, performs 1 STS  PATIENT SURVEYS:  FOTO unable to complete due to error indicating FOTO not available in system  TODAY'S TREATMENT:   Observed right calf- Bandage with no swelling/redness surrounding bandage. Patient to see wound MD in next 1-2 weeks. Instructed to continue not to use right AFO until wound heals.  *Patient slow to communicate and requires increased time to respond and effectively communicate. Patient reported no specific HEP from Home health PT. States she is getting up some from lift chair raised at approx 80 % Therex: Instructed patient in some beginner seated LE strengthening exercises. -seated hip flex (AROM on Left and AAROM on right) 2 sets of 10 reps (minimal activation initially with R LE but did improve with reps.)  -seated knee ext (AROM on Left and AAROM on right) 2 sets of 10 reps (only partial ROM with R LE- did improve some with with reps.)  -Seated Assisted quad sets on right (leg extended) - minimal activation of quads 2 sets of 10 reps (unable to hold > 2 sec)   Therapeutic activity:  Instructed in proper sit to stand technique from wheelchair including: Scooting to edge of seat; position feet and left UE, lean forward Patient required mod/max A with max VC to push up from armrest rather than reach for hemiwalker x 5 trials today.   PATIENT EDUCATION: Education details: wound care, pressure relief, to check RLE ankle/foot each day throughout day due to impaired sensation, assessment findings Person  educated: pt and her spouse Education method:Explanation, Demonstration, Tactile cues, and Verbal cues Education comprehension: verbalized understanding and needs further education   HOME EXERCISE PROGRAM: Access Code: [email or web address] URL: [email or web address] Date: 10/02/2021 Prepared by: Sande Brothers  Exercises - Seated Hip Flexion March with Ankle Weights  - 1 x daily - 7 x weekly - 3 sets - 10 reps - Seated Long Arc Quad  - 1 x daily - 7 x weekly - 3 sets - 10 reps - Supine Quad Set  - 1 x daily - 7 x weekly - 3 sets - 10 reps - Proper Sit to Stand Technique  - 1 x daily - 7 x weekly - 3 sets - 10 reps  GOALS: Goals reviewed with patient? Yes  SHORT TERM GOALS: Target date: 10/28/2021  Patient will be independent in home exercise program to improve strength/mobility for better functional independence with ADLs. Baseline: to be initiated next 1-2 sessions Goal status: INITIAL   LONG TERM GOALS: Target date: 12/09/2021  Patient will increase FOTO score to equal to or greater than     to demonstrate statistically significant improvement in mobility and quality of life.  Baseline: to be initiated next 1-2 visits Goal status: INITIAL  2.   Patient (< 84 years old) will be able to perform and complete five times sit to stand test from standard chair height in <30 seconds to indicate increased LE strength and improved balance. Baseline: Pt able to complete only 1 stand, insufficient strength currently to complete full test Goal status: INITIAL  3.  Pt will improve  LLE strength by at least 1/2 point on MMT in order to increase ease with transfers and functional mobility. Baseline: LLE grossly 4/5 throughout Goal status: INITIAL  4.  Patient will increase 10 meter walk test to at least 0.80 m/s as to improve gait speed for better community ambulation and to reduce fall risk. Baseline: to be tested next 1-2 visits Goal status: INITIAL  5.  Pt will complete STS from  standard height chair with BUE assist off armrests and with no greater than min assist to improve ease and safety with transfers. Baseline: mod a x2 Goal status: INITIAL   ASSESSMENT:  CLINICAL IMPRESSION: Patient presents with potential injury to right wrist after recent falls. Stressed importance of going to MD for further evaluation or ortho urgent care. Patient and husband verbalized understanding. Treatment was slightly limited as patient was trying to provide details of her condition but had a hard time articulating/verbalizing. She was instructed in some basic LE strengthening and safety training with transfer. Provided beginner HEP and patient will benefit from review and progression next visit.  The pt would benefit from continued skilled PT to address Her immobility, LE muscle weakness, and pain in order to decrease fall risk and increase functional mobility and QOL.   OBJECTIVE IMPAIRMENTS Abnormal gait, decreased activity tolerance, decreased balance, decreased coordination, decreased endurance, decreased mobility, difficulty walking, decreased ROM, decreased strength, hypomobility, increased edema, increased fascial restrictions, increased muscle spasms, impaired flexibility, impaired sensation, impaired tone, impaired UE functional use, improper body mechanics, postural dysfunction, and pain.   ACTIVITY LIMITATIONS carrying, lifting, bending, sitting, standing, squatting, stairs, transfers, bed mobility, bathing, toileting, dressing, reach over head, hygiene/grooming, locomotion level, and caring for others  PARTICIPATION LIMITATIONS: meal prep, cleaning, laundry, medication management, personal finances, driving, shopping, community activity, occupation, and yard work  PERSONAL Education officer, community, Past/current experiences, Sex, and 3+ comorbidities: Anorexia, dehydration, Adult Failure to thrive, contracture of muscles multiple sites, multiple falls, UTI, extensive PMH refer to chart   are also affecting patient's functional outcome.   REHAB POTENTIAL: Fair    CLINICAL DECISION MAKING: Evolving/moderate complexity  EVALUATION COMPLEXITY: High  PLAN: PT FREQUENCY: 2x/week  PT DURATION: 12 weeks  PLANNED INTERVENTIONS: Therapeutic exercises, Therapeutic activity, Neuromuscular re-education, Balance training, Gait training, Patient/Family education, Self Care, Joint mobilization, Joint manipulation, Stair training, Vestibular training, Canalith repositioning, Orthotic/Fit training, DME instructions, Wheelchair mobility training, Spinal mobilization, Cryotherapy, Moist heat, scar mobilization, Splintting, Taping, Manual therapy, and Re-evaluation  PLAN FOR NEXT SESSION: complete assessment, initiate stretching, strengthening, gait, balance   Lewis Moccasin, PT 10/02/2021, 5:07 PM

## 2021-10-07 ENCOUNTER — Encounter: Payer: Self-pay | Admitting: Occupational Therapy

## 2021-10-07 ENCOUNTER — Ambulatory Visit: Payer: Medicare Other | Attending: Adult Health Nurse Practitioner

## 2021-10-07 ENCOUNTER — Ambulatory Visit: Payer: Medicare Other | Admitting: Occupational Therapy

## 2021-10-07 ENCOUNTER — Ambulatory Visit: Payer: Medicare Other | Admitting: Speech Pathology

## 2021-10-07 DIAGNOSIS — M6281 Muscle weakness (generalized): Secondary | ICD-10-CM | POA: Diagnosis present

## 2021-10-07 DIAGNOSIS — R4701 Aphasia: Secondary | ICD-10-CM | POA: Insufficient documentation

## 2021-10-07 DIAGNOSIS — C719 Malignant neoplasm of brain, unspecified: Secondary | ICD-10-CM | POA: Insufficient documentation

## 2021-10-07 DIAGNOSIS — R2681 Unsteadiness on feet: Secondary | ICD-10-CM | POA: Insufficient documentation

## 2021-10-07 DIAGNOSIS — R41841 Cognitive communication deficit: Secondary | ICD-10-CM | POA: Insufficient documentation

## 2021-10-07 DIAGNOSIS — R262 Difficulty in walking, not elsewhere classified: Secondary | ICD-10-CM | POA: Insufficient documentation

## 2021-10-07 DIAGNOSIS — R278 Other lack of coordination: Secondary | ICD-10-CM | POA: Insufficient documentation

## 2021-10-07 NOTE — Therapy (Signed)
OUTPATIENT PHYSICAL THERAPY NEURO TREATMENT  Patient Name: Kelly Rollins MRN: [id number] DOB:28-Jul-1970, 51 y.o., female Today's Date: 10/07/2021   PCP: [name], PA-C REFERRING PROVIDER: [name], Bonnell Public, PA-C   PT End of Session - 10/07/21 1353     Visit Number 3    Number of Visits 25    Date for PT Re-Evaluation 12/09/21    PT Start Time 1350    PT Stop Time 1431    PT Time Calculation (min) 41 min    Equipment Utilized During Treatment Gait belt    Activity Tolerance Patient tolerated treatment well    Behavior During Therapy [facility] for tasks assessed/performed              Past Medical History:  Diagnosis Date   ABDOMINAL PAIN OTHER SPECIFIED SITE 02/21/2009   Qualifier: Diagnosis of  By: Deborra Medina MD, Talia     ABDOMINAL PAIN-LUQ 04/17/2009   Qualifier: Diagnosis of  By: Fuller Plan MD Lamont Snowball T    Anal fissure    Anginal pain ([place])    Asthma    Depression    Diabetes mellitus without complication ([place])    Dyspnea    Dysrhythmia    Headache(784.0)    HEMATOCHEZIA 04/17/2009   Qualifier: Diagnosis of  By: Fuller Plan MD Lamont Snowball T    Hyperlipidemia    Hypertension    IBS (irritable bowel syndrome)    Migraine headache 11/06/2008   Qualifier: Diagnosis of  By: Deborra Medina MD, Talia     OVARIAN CYST, RIGHT 02/21/2009   Qualifier: Diagnosis of  By: Deborra Medina MD, Talia     PALPITATIONS, OCCASIONAL 04/17/2010   Qualifier: Diagnosis of  By: Deborra Medina MD, Talia     Pancreatitis 10/14/2017   Past Surgical History:  Procedure Laterality Date   BREAST EXCISIONAL BIOPSY Left 1997   ? left side, done at time of reduction , benign   BREAST REDUCTION SURGERY     bilateral   CESAREAN SECTION     CHOLECYSTECTOMY     COLONOSCOPY     ESOPHAGOGASTRODUODENOSCOPY     ESOPHAGOGASTRODUODENOSCOPY (EGD) WITH PROPOFOL N/A 10/21/2020   Procedure: ESOPHAGOGASTRODUODENOSCOPY (EGD) WITH PROPOFOL;  Surgeon: Lesly Rubenstein, MD;  Location: [facility] ENDOSCOPY;  Service: Endoscopy;   Laterality: N/A;   REDUCTION MAMMAPLASTY Bilateral 1997   VAGINAL HYSTERECTOMY     Patient Active Problem List   Diagnosis Date Noted   Abnormal MRI, lumbar spine (01/29/2020) 02/19/2020   Anxiety due to MRI 01/25/2020   Claustrophobia associated to MRI 01/25/2020   Enthesopathy of knee region (Right) 01/25/2020   Enthesopathy of knee region (Left) 01/25/2020   Lumbar facet hypertrophy 01/25/2020   Lumbosacral radiculopathy at S1 (Left) 01/17/2020   Lumbosacral radiculopathy (S1) (Left) 01/17/2020   Chronic lower extremity pain (Bilateral) (L>R) 01/17/2020   History of pancreatitis 07/18/2019   Obstructive sleep apnea syndrome 01/02/2019   Lumbar facet arthropathy (Multilevel) (Bilateral) 12/14/2018   Osteoarthritis involving multiple joints 11/28/2018   Chronic hip pain (Left) 11/16/2018   Acute hip pain (Left) 11/16/2018   DDD (degenerative disc disease), lumbosacral 11/16/2018   Chronic hip pain (Secondary source of pain) (Bilateral) (L>R) 05/19/2017   Elevated C-reactive protein (CRP) 05/19/2017   Elevated sed rate 05/19/2017   Spondylosis without myelopathy or radiculopathy, lumbosacral region 05/19/2017   Pharmacologic therapy 05/06/2017   Disorder of skeletal system 05/06/2017   Problems influencing health status 05/06/2017   Chest pain with moderate risk of acute coronary syndrome 04/21/2017  DOE (dyspnea on exertion) 04/21/2017   Intermittent palpitations 04/21/2017   Vitamin D insufficiency 05/04/2016   Depression 02/04/2016   Fatty liver 02/04/2016   Hypertension associated with diabetes ([place]) 02/04/2016   Chronic pain syndrome 01/15/2016   Acute low back pain 05/28/2015   Lumbar transverse process fracture (HCC) (Left L1) (03/11/15) 05/28/2015   Long term current use of opiate analgesic 05/01/2015   Long term prescription opiate use 05/01/2015   Opiate use (25 MME/Day) 05/01/2015   Encounter for therapeutic drug level monitoring 05/01/2015   Encounter for pain  management planning 05/01/2015   Chronic low back pain (Primary Source of Pain) (Bilateral) (L>R) 05/01/2015   Lumbar facet syndrome (Bilateral) (L>R) 05/01/2015   Chronic knee pain [facility] source of pain) (Bilateral) (L>R) 05/01/2015   Lumbar spondylosis 05/01/2015   Neuropathic pain 05/01/2015   Neurogenic pain 05/01/2015   Myofascial pain 05/01/2015   Essential hypertriglyceridemia 04/26/2014   Type 2 diabetes mellitus with hyperglycemia, with long-term current use of insulin ([place]) 04/26/2014   Anxiety 05/15/2010   Obesity 05/15/2010   Hyperlipidemia 04/03/2010   Fatty liver disease 04/02/2010   Pure hypercholesterolemia 03/19/2010   GERD 04/17/2009   Constipation 04/17/2009   TOBACCO ABUSE 11/06/2008    ONSET DATE: January 2023  REFERRING DIAG: C71.9 (ICD-10-CM) - Malignant neoplasm of brain, unspecified  THERAPY DIAG:  Muscle weakness (generalized)  Unsteadiness on feet  Other lack of coordination  Rationale for Evaluation and Treatment Rehabilitation  SUBJECTIVE:                                                                                                                                                                                              SUBJECTIVE STATEMENT: Pt reports she has not followed-up with a physician regarding her R hand/wrist. Pt reports she has been extremely busy with birthday festivities. She had a good birthday. She reports swelling "all went down and I didn't have any more pain" in R hand/wrist. Current general pain level is 3/10, head pain is 3/10.  Pt accompanied by:  [name]  PAIN:  Are you having pain? 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 Pt's husband, [name], present with pt. [name] provides majority of hx as pt has difficulty with speech due to Broca's aphasia (working with SLP). Pt reports partial paralysis of R side. She is unable to move her  RUE, RLE also affected. Pt ambulates with hemiwalker in their home for short distances and does not use WC at home. She fatigues quickly with ambulation. Pt has AFO for RLE but unable to determine how to donn it without it hurting her LE. Pt currently with wound on R calf covered by large bandage that they have been treating for at least a month (reports physician aware). In addition to wound on R calf, pt reports R side skin irritation under breast and lateral trunk due to bra underwire. They have since removed underwire. Pt was hospitalized 10 days in June due to UTI. She has had at least 12 falls in the last six months. Reports no injuries with falls, and states "just bumps and bruises." Pt reports she has constant head pain. Pt spouse reports pt has pain throughout her whole right side down to LE. Can have bad cramps in RUE and RLE. Reports no numbness/tingling. Occ dizziness, unable to describe further. Pt sleeps in recliner, wakes up stiff in the morning. PMH per chart includes: Anorexia, dehydration, Adult Failure to thrive, contracture of muscles multiple sites. Pt./caregiver reports recently being hospitalized with a UTI, and dehydration. Extensive hx, please refer to chart for full details.   PAIN:  Are you having pain? Yes: NPRS scale: 3/10 Pain location: Top of head, front Pain description: States, "cancer pain" head pain Aggravating factors: unsure Relieving factors: medications  PRECAUTIONS: Fall  WEIGHT BEARING RESTRICTIONS No  FALLS: Has patient fallen in last 6 months? Yes. Number of falls 12  LIVING ENVIRONMENT: information primarily from chart Lives with: lives with their spouse Lives in: House/apartment Stairs: Yes: External: 4 steps; bilateral but cannot  reach both Has following equipment at home: Single point cane, Walker - 4 wheeled, Hemi walker, shower chair, bed side commode, Grab bars, and lift chair  PLOF: Independent  PATIENT GOALS  "Normal function" as much as possible. Improving her balance.  OBJECTIVE:   DIAGNOSTIC FINDINGS:   MR BRAIN W WO CONTRAST 02/06/2021:  "IMPRESSION: Masslike T2 signal in the anterior left frontal lobe, without associated enhancement, most concerning for a low-grade glioma. Additional areas of increased T2 signal in the left frontal lobe may also infiltrative low-grade glioma or associated edema."  CT HEAD WO CONTRAST (5MM) 02/06/2021: "IMPRESSION: High left anterior frontal edema with new adjacent masslike hyperdensity involving the left frontal white matter, high left left frontal cortex, and genu of corpus callosum. Overall, findings are suspicious for malignancy (including hypercellular tumors such as GBM and lymphoma). Acute or subacute left ACA territory infarct is a differential consideration, but thought less likely. Recommend MRI with contrast for further evaluation."   COGNITION: Overall cognitive status: Within functional limits for tasks assessed   SENSATION: Impaired in RLE - diminished and absent throughout R ankle/foot - noted wound on calf - LLE intact to light touch   COORDINATION:  Finger to chin with LUE - few errors otherwise able to complete. Unable to perform RUE.  EDEMA: noted B ankles   MUSCLE TONE: RLE: Mild-moderate. Pt unable to get R heel on floor in weightbearing position, plantarflexed  LOWER EXTREMITY MMT:     LLE grossly 4/5 throughout Trace activation RLE in chair with majority of muscles tested. RLE generally <3/5. Exception R hip abductors 3-/5, would benefit from further assessment in supine.    TRANSFERS: Assistive device utilized: Hemi walker  Sit to stand:  Attempted 5xSTS in session, unable to complete but did perform 2x in session with mod a.  Spouse reports pt able to perform transfers with 20% help at home from chair of increased height Stand to sit: Mod Ax2   GAIT: Gait pattern: step to pattern, RLE fixed in PF, externally rotated, uses L hemiwalker Distance walked: 20 ft Assistive device utilized: Hemi walker Level of assistance:  close CGA Comments: heavy LUE weightbearing on HW  FUNCTIONAL TESTs:  5 times sit to stand: unable to complete from standard chair height, performs 1 STS  PATIENT SURVEYS:  FOTO unable to complete due to error indicating FOTO not available in system  TODAY'S TREATMENT:    Therex: Instructed patient in some beginner seated LE strengthening exercises.  Seated: AAROM for RLE and AROM LLE with the following: -LAQ 2x10 R, 2x15 L  Chair assisted hamstring stretch 60 sec B  Soccer ball kicks with focus on RLE, large-amplitude cues, x multiple reps. Improves amplitude of movement with reps. Rates medium.  Standing endurance at support bar 45 sec with UUE support CGA-min a and block at R knee  STS 5x, 3x. Pulls to stand.  YTB LLE hamstring curl 15x    Towel slide hamstring curls RLE 15x. Performed with assist from PT, improves with reps.   PATIENT EDUCATION: Education details: Pt educated throughout session about proper posture and technique with exercises. Improved exercise technique, movement at target joints, use of target muscles after min to mod verbal, visual, tactile cues.  Person educated: pt and her spouse Education method:Explanation, Demonstration, Tactile cues, and Verbal cues Education comprehension: verbalized understanding and needs further education   HOME EXERCISE PROGRAM: No updates on this date, pt to continue HEP as previously given Access Code: [email or web address] URL: [email or web address] Date: 10/02/2021 Prepared by: Sande Brothers  Exercises - Seated Hip Flexion March with Ankle Weights  - 1 x daily - 7 x weekly - 3 sets - 10 reps - Seated Long Arc  Quad  - 1 x daily - 7 x weekly - 3 sets - 10 reps - Supine Quad Set  - 1 x daily - 7 x weekly - 3 sets - 10 reps - Proper Sit to Stand Technique  - 1 x daily - 7 x weekly - 3 sets - 10 reps  GOALS: Goals reviewed with patient? Yes  SHORT TERM GOALS: Target date: 10/28/2021  Patient will be independent in home exercise program to improve strength/mobility for better functional independence with ADLs. Baseline: to be initiated next 1-2 sessions Goal status: INITIAL   LONG TERM GOALS: Target date: 12/09/2021  Patient will increase FOTO score to equal to or greater than     to demonstrate statistically significant improvement in mobility and quality of life.  Baseline: to be initiated next 1-2 visits Goal status: INITIAL  2.   Patient (< 23 years old) will be able to perform and complete five times sit to stand test from standard chair height in <30 seconds to indicate increased LE strength and improved balance. Baseline: Pt able to complete only 1 stand, insufficient strength currently to complete full test Goal status: INITIAL  3.  Pt will improve LLE strength by at least 1/2 point on MMT in order to increase ease with transfers and functional mobility. Baseline: LLE grossly 4/5 throughout Goal status: INITIAL  4.  Patient will increase 10 meter walk test to at least 0.80 m/s as to improve gait speed for better community ambulation and to reduce fall risk. Baseline: to be tested next 1-2 visits Goal status: INITIAL  5.  Pt will complete STS  from standard height chair with BUE assist off armrests and with no greater than min assist to improve ease and safety with transfers. Baseline: mod a x2 Goal status: INITIAL   ASSESSMENT:  CLINICAL IMPRESSION: Pt presents with improvement in R wrist/hand symptoms. She did not follow-up with physician, but reports resolution of swelling and pain. No swelling observed on this date. Pt able to progress to performing more challenging strengthening  therex. Her ability to perform hamstring curls/knee ext. Are somewhat limited by tone in addition to weakness of RLE. Pt did exhibit within-session improvement in R knee ext following stretching and with use of external cue. The pt would benefit from continued skilled PT to address Her immobility, LE muscle weakness, and pain in order to decrease fall risk and increase functional mobility and QOL.   OBJECTIVE IMPAIRMENTS Abnormal gait, decreased activity tolerance, decreased balance, decreased coordination, decreased endurance, decreased mobility, difficulty walking, decreased ROM, decreased strength, hypomobility, increased edema, increased fascial restrictions, increased muscle spasms, impaired flexibility, impaired sensation, impaired tone, impaired UE functional use, improper body mechanics, postural dysfunction, and pain.   ACTIVITY LIMITATIONS carrying, lifting, bending, sitting, standing, squatting, stairs, transfers, bed mobility, bathing, toileting, dressing, reach over head, hygiene/grooming, locomotion level, and caring for others  PARTICIPATION LIMITATIONS: meal prep, cleaning, laundry, medication management, personal finances, driving, shopping, community activity, occupation, and yard work  PERSONAL Education officer, community, Past/current experiences, Sex, and 3+ comorbidities: Anorexia, dehydration, Adult Failure to thrive, contracture of muscles multiple sites, multiple falls, UTI, extensive PMH refer to chart  are also affecting patient's functional outcome.   REHAB POTENTIAL: Fair    CLINICAL DECISION MAKING: Evolving/moderate complexity  EVALUATION COMPLEXITY: High  PLAN: PT FREQUENCY: 2x/week  PT DURATION: 12 weeks  PLANNED INTERVENTIONS: Therapeutic exercises, Therapeutic activity, Neuromuscular re-education, Balance training, Gait training, Patient/Family education, Self Care, Joint mobilization, Joint manipulation, Stair training, Vestibular training, Canalith repositioning,  Orthotic/Fit training, DME instructions, Wheelchair mobility training, Spinal mobilization, Cryotherapy, Moist heat, scar mobilization, Splintting, Taping, Manual therapy, and Re-evaluation  PLAN FOR NEXT SESSION:  stretching, strengthening, gait, balance   Zollie Pee, PT 10/07/2021, 2:45 PM

## 2021-10-07 NOTE — Therapy (Addendum)
OUTPATIENT OCCUPATIONAL THERAPY TREATMENT  Patient Name: Kelly Rollins MRN: [id number] DOB:03/18/1970, 51 y.o., female Today's Date: 10/07/2021  PCP: [name], PA-C REFERRING PROVIDER: [name], Corrine Jeani Hawking, PA-C   OT End of Session - 10/07/21 1354     Visit Number 14    Number of Visits 24    Date for OT Re-Evaluation 10/27/21    Authorization Time Period Progress reports period starting 08/04/2021    OT Start Time 1300    OT Stop Time 1345    OT Time Calculation (min) 45 min    Activity Tolerance Patient tolerated treatment well    Behavior During Therapy [facility] for tasks assessed/performed             Past Medical History:  Diagnosis Date   ABDOMINAL PAIN OTHER SPECIFIED SITE 02/21/2009   Qualifier: Diagnosis of  By: Deborra Medina MD, Talia     ABDOMINAL PAIN-LUQ 04/17/2009   Qualifier: Diagnosis of  By: Fuller Plan MD Lamont Snowball T    Anal fissure    Anginal pain ([place])    Asthma    Depression    Diabetes mellitus without complication ([place])    Dyspnea    Dysrhythmia    Headache(784.0)    HEMATOCHEZIA 04/17/2009   Qualifier: Diagnosis of  By: Fuller Plan MD Lamont Snowball T    Hyperlipidemia    Hypertension    IBS (irritable bowel syndrome)    Migraine headache 11/06/2008   Qualifier: Diagnosis of  By: Deborra Medina MD, Talia     OVARIAN CYST, RIGHT 02/21/2009   Qualifier: Diagnosis of  By: Deborra Medina MD, Talia     PALPITATIONS, OCCASIONAL 04/17/2010   Qualifier: Diagnosis of  By: Deborra Medina MD, Talia     Pancreatitis 10/14/2017   Past Surgical History:  Procedure Laterality Date   BREAST EXCISIONAL BIOPSY Left 1997   ? left side, done at time of reduction , benign   BREAST REDUCTION SURGERY     bilateral   CESAREAN SECTION     CHOLECYSTECTOMY     COLONOSCOPY     ESOPHAGOGASTRODUODENOSCOPY     ESOPHAGOGASTRODUODENOSCOPY (EGD) WITH PROPOFOL N/A 10/21/2020   Procedure: ESOPHAGOGASTRODUODENOSCOPY (EGD) WITH PROPOFOL;  Surgeon: Lesly Rubenstein, MD;  Location: [facility] ENDOSCOPY;   Service: Endoscopy;  Laterality: N/A;   REDUCTION MAMMAPLASTY Bilateral 1997   VAGINAL HYSTERECTOMY     Patient Active Problem List   Diagnosis Date Noted   Abnormal MRI, lumbar spine (01/29/2020) 02/19/2020   Anxiety due to MRI 01/25/2020   Claustrophobia associated to MRI 01/25/2020   Enthesopathy of knee region (Right) 01/25/2020   Enthesopathy of knee region (Left) 01/25/2020   Lumbar facet hypertrophy 01/25/2020   Lumbosacral radiculopathy at S1 (Left) 01/17/2020   Lumbosacral radiculopathy (S1) (Left) 01/17/2020   Chronic lower extremity pain (Bilateral) (L>R) 01/17/2020   History of pancreatitis 07/18/2019   Obstructive sleep apnea syndrome 01/02/2019   Lumbar facet arthropathy (Multilevel) (Bilateral) 12/14/2018   Osteoarthritis involving multiple joints 11/28/2018   Chronic hip pain (Left) 11/16/2018   Acute hip pain (Left) 11/16/2018   DDD (degenerative disc disease), lumbosacral 11/16/2018   Chronic hip pain (Secondary source of pain) (Bilateral) (L>R) 05/19/2017   Elevated C-reactive protein (CRP) 05/19/2017   Elevated sed rate 05/19/2017   Spondylosis without myelopathy or radiculopathy, lumbosacral region 05/19/2017   Pharmacologic therapy 05/06/2017   Disorder of skeletal system 05/06/2017   Problems influencing health status 05/06/2017   Chest pain with moderate risk of acute coronary syndrome 04/21/2017   DOE (  dyspnea on exertion) 04/21/2017   Intermittent palpitations 04/21/2017   Vitamin D insufficiency 05/04/2016   Depression 02/04/2016   Fatty liver 02/04/2016   Hypertension associated with diabetes ([place]) 02/04/2016   Chronic pain syndrome 01/15/2016   Acute low back pain 05/28/2015   Lumbar transverse process fracture (HCC) (Left L1) (03/11/15) 05/28/2015   Long term current use of opiate analgesic 05/01/2015   Long term prescription opiate use 05/01/2015   Opiate use (25 MME/Day) 05/01/2015   Encounter for therapeutic drug level monitoring 05/01/2015    Encounter for pain management planning 05/01/2015   Chronic low back pain (Primary Source of Pain) (Bilateral) (L>R) 05/01/2015   Lumbar facet syndrome (Bilateral) (L>R) 05/01/2015   Chronic knee pain [facility] source of pain) (Bilateral) (L>R) 05/01/2015   Lumbar spondylosis 05/01/2015   Neuropathic pain 05/01/2015   Neurogenic pain 05/01/2015   Myofascial pain 05/01/2015   Essential hypertriglyceridemia 04/26/2014   Type 2 diabetes mellitus with hyperglycemia, with long-term current use of insulin ([place]) 04/26/2014   Anxiety 05/15/2010   Obesity 05/15/2010   Hyperlipidemia 04/03/2010   Fatty liver disease 04/02/2010   Pure hypercholesterolemia 03/19/2010   GERD 04/17/2009   Constipation 04/17/2009   TOBACCO ABUSE 11/06/2008    ONSET DATE: 02/06/2021  REFERRING DIAG: Astrocytoma  THERAPY DIAG:  Muscle weakness (generalized)  Rationale for Evaluation and Treatment Rehabilitation  SUBJECTIVE:  Pt. Reports they are in the process of getting her medications clarified with the pharmacy, and MD office.   SUBJECTIVE STATEMENT: Pt. Reports  having had a huge turnout for her 51th birthday party this weekend. Pt. reports that she saw many people that she hasn't seen.   Pt accompanied by: significant other  PERTINENT HISTORY:  Pt. Is a 51 y.o. 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 PMHx includes: Anorexia, dehydration, Adult Failure to thrive, contracture of muscles multiple sites. Pt./caregiver reports recently being hospitalized with a UTI, and dehydration.   PRECAUTIONS: Fall  WEIGHT BEARING RESTRICTIONS No  PAIN:  Are you having pain?  Pt. reports 3/10 pain in the right shoulder. Pt. intermittent facial, and verbal responses reflect increased pain levels.  FALLS: Has patient fallen in last 6 months? Yes. Number of falls Multiple Falls  LIVING ENVIRONMENT: Lives with: Family Lives  in: House/apartment Stairs: Yes: External: 4 steps; bilateral but cannot reach both Has following equipment at home: Single point cane, Walker - 4 wheeled, Hemi walker, shower chair, bed side commode, and Grab bars Lift chair in the tub, [place],  PLOF: Independent  PATIENT GOALS: To improve the right UE  OBJECTIVE:   HAND DOMINANCE: Right  ADLs: Transfers/ambulation related to ADLs: Eating: MaxA with cutting food, setting up, Modified I with nondominant Left hand, [place]  Grooming: Independent  UB Dressing: MaxA  LB Dressing: MaxA Toileting: Uses a BSCommode mInA transfers, MaxA clothing negotiation Bathing: MaxA Tub Shower transfers:  Uses a shower lift chair Equipment:  w/c, BSCommode, cane   IADLs: Shopping: Husband performs Light housekeeping: Max Meal Prep: MaxA Community mobility: Relies on family Medication management:Husband assists Doctor, hospital: Performs together Handwriting: Not legible  MOBILITY STATUS: Hx of falls    ACTIVITY TOLERANCE: Activity tolerance: limited  FUNCTIONAL OUTCOME MEASURES: FOTO: 11.76  UPPER EXTREMITY ROM  : WFL Left AROM/PROM   Passive ROM Right eval Right  08/18/21  Shoulder flexion 30   Shoulder abduction 40 62  Shoulder adduction    Shoulder extension  Shoulder internal rotation    Shoulder external rotation    Elbow flexion 120   Elbow extension -67   Wrist flexion Neutral   Wrist extension 12   Wrist ulnar deviation    Wrist radial deviation    Wrist pronation    Wrist supination    (Blank rows = not tested)   UPPER EXTREMITY MMT:     MMT Right eval Left eval  Shoulder flexion 0/5 4+/5  Shoulder abduction 0/5 4+/5  Shoulder adduction    Shoulder extension    Shoulder internal rotation    Shoulder external rotation    Middle trapezius    Lower trapezius    Elbow flexion 0/5 4+/5  Elbow extension 0/5 4+/5  Wrist flexion    Wrist extension 0/5 4+/5  Wrist ulnar deviation    Wrist radial  deviation    Wrist pronation    Wrist supination    (Blank rows = not tested)    SENSATION: Light touch: WFL  EDEMA:   Wrist: 18.5 cm MCP: 21.5 cm    MUSCLE TONE: RUE: Severe  COGNITION: Overall cognitive status: Impaired  VISION: Subjective report: Wears glasses Baseline vision: Wears glasses all the time Pt. Reports no changes    PRAXIS: Impaired: Motor planning                TODAY'S TREATMENT:   Pt. tolerated retrograde massage to the right hand, and digits secondary to edema. Pt. tolerated soft tissue massage to the muscles in the right scapular region. Manual therapy was perormed prior to, and in preparation for ROM, and therapeutic Ex. Pt. tolerated PROM for right wrist flexion and extension, forearm supination/pronation, digit flexion and extension, and thumb abduction. Pt./caregiver education was provided about  the  right hand palm protector, edema control techniques, ROM, positioning in right shoulder. Pt. continues to tolerate the palm protector with 1" roll without skin irritation, or breakdown in the right palm.    Pt. presents with less pain, and edema in the right wrist, and distal forearm today, although reports 3/10 pain in the right shoulder today. Pt.'s right shoulder flexion, abduction, and digit extension ROM continues to be limited by pain, however pt. was able to tolerate increased shoulder abduction,  elbow flexion, extension, forearm supination, pronation, and digit extension today.  Pt. continues to be able to tolerate slightly more abduction today following moist heat pack modality to the right shoulder, and soft tissue massage to the scapular region. Pt. presents with increased flexor tightness in the right 2nd, and 3rd digits. Pt. continues to initially maintain the digits on her right hand in tight flexor synergy pattern. Pt. was able to tolerate retrograde massage for edema control, and ROM to the right hand, and digits. Pt. continues to work on  improving pain, and improving RUE ROM in order to improve engagement with ADLs/IADLs, and promote good skin integrity through the palmar surface of the hand.    PATIENT EDUCATION: Education details: Right UE ROM, positioning of the RUE Person educated: Patient and Spouse Education method: Explanation, Demonstration, Tactile cues, and Verbal cues Education comprehension: verbalized understanding, returned demonstration, and needs further education   HOME EXERCISE PROGRAM:   Pt./caregiver education about positioning of the RUE, ROM    GOALS: Goals reviewed with patient? Yes  SHORT TERM GOALS: Target date: 09/15/2021    Pt./caregivers will demonstrate independence with HEPs for UE ROM, and edema control Baseline: 09/08/2021: Pt. Requires assist for positioning, ROM, edema control. Eval: No current  HEPs  Goal status: Ongoing    LONG TERM GOALS: Target date: 10/27/2021    Pt. Will improve right hand edema by 1 cm at the wrist, and MCPs Baseline: 09/08/2021: Wrist: 17.5 cm, MCPs: 20.5 cm Eval: Wrist: 18.5cm, MCPs: 21.5cm Goal status: Ongoing  2.  Pt. Will improve right shoulder PROM by 10 degrees with ADLs Baseline: 09/08/2021: Right shoulder flexion: 48, abduction: 53 Eval: PROM shoulder flexion: 30 degrees, Abduction: 40 degrees Goal status: Ongoing  3.  Pt. Will improve right elbow PROM by 10 degrees to prepare for hand to mouth patterns Baseline: 09/08/2021: elbow extension: -46, flexion: 110 Eval:   elbow extension -67, flexion 120 Goal status: Ongoing  4.  Pt. Will improve PROM right wrist flexion, and extension by 10 degrees Baseline: 09/08/2021: Wrist extension: 40Eval: wrist extension 12 degrees, flexion to neutral Goal status: Ongoing  5.  Pt. Will tolerate full digit extension in the right hand to promote good skin integrity through the palmar surface of the right hand  Baseline: 09/08/2021: Pt. Is able to tolerate PROM through 50% of digit extension in the digits.  Eval: Pt. Maintains digits in tight flexion Goal status: Ongoing  6.  Pt. Will require minA UE dressing using one armed dressing techniques. Baseline: 09/08/2021: MaxA Eval: MaxA Goal status: Ongoing ASSESSMENT:  CLINICAL IMPRESSION:  Pt. presents with less pain, and edema in the right wrist, and distal forearm today, although reports 3/10 pain in the right shoulder today. Pt.'s right shoulder flexion, abduction, and digit extension ROM continues to be limited by pain, however pt. was able to tolerate increased shoulder abduction,  elbow flexion, extension, forearm supination, pronation, and digit extension today.  Pt. continues to be able to tolerate slightly more abduction today following moist heat pack modality to the right shoulder, and soft tissue massage to the scapular region. Pt. presents with increased flexor tightness in the right 2nd, and 3rd digits. Pt. continues to initially maintain the digits on her right hand in tight flexor synergy pattern. Pt. was able to tolerate retrograde massage for edema control, and ROM to the right hand, and digits. Pt. continues to work on improving pain, and improving RUE ROM in order to improve engagement with ADLs/IADLs, and promote good skin integrity through the palmar surface of the hand.    PERFORMANCE DEFICITS in functional skills including ADLs, IADLs, coordination, proprioception, sensation, edema, tone, ROM, strength, pain, FMC, GMC, balance, decreased knowledge of use of DME, skin integrity, and UE functional use, cognitive skills including attention, memory, and safety awareness, and psychosocial skills including coping strategies.   IMPAIRMENTS are limiting patient from ADLs, IADLs, education, work, leisure, and social participation.   COMORBIDITIES may have co-morbidities  that affects occupational performance. Patient will benefit from skilled OT to address above impairments and improve overall function.  MODIFICATION OR ASSISTANCE TO  COMPLETE EVALUATION: Maximum modification of tasks or assist with assess necessary to complete an evaluation.  OT OCCUPATIONAL PROFILE AND HISTORY: Detailed assessment: Review of records and additional review of physical, cognitive, psychosocial history related to current functional performance.  CLINICAL DECISION MAKING: High - multiple treatment options, significant modification of task necessary  REHAB POTENTIAL: Good for stated goals  EVALUATION COMPLEXITY: High    PLAN: OT FREQUENCY: 2x/week  OT DURATION: 12 weeks  PLANNED INTERVENTIONS: self care/ADL training, therapeutic exercise, therapeutic activity, neuromuscular re-education, manual therapy, passive range of motion, paraffin, moist heat, contrast bath, patient/family education, cognitive remediation/compensation, energy conservation, and coping strategies training  RECOMMENDED OTHER  SERVICES: PT, and ST  CONSULTED AND AGREED WITH PLAN OF CARE: Patient and family member/caregiver  PLAN FOR NEXT SESSION: Initiate PROM, and RUE Edema control techniques   Harrel Carina, MS, OTR/L  Harrel Carina, OT 10/07/2021, 1:58 PM

## 2021-10-09 ENCOUNTER — Ambulatory Visit: Payer: Medicare Other | Admitting: Speech Pathology

## 2021-10-09 ENCOUNTER — Encounter: Payer: Medicare Other | Admitting: Occupational Therapy

## 2021-10-09 ENCOUNTER — Ambulatory Visit: Payer: Medicare Other

## 2021-10-14 ENCOUNTER — Ambulatory Visit: Payer: Medicare Other | Admitting: Occupational Therapy

## 2021-10-14 ENCOUNTER — Ambulatory Visit: Payer: Medicare Other

## 2021-10-16 ENCOUNTER — Encounter: Payer: Self-pay | Admitting: Occupational Therapy

## 2021-10-16 ENCOUNTER — Ambulatory Visit: Payer: Medicare Other | Admitting: Occupational Therapy

## 2021-10-16 ENCOUNTER — Ambulatory Visit: Payer: Medicare Other

## 2021-10-16 DIAGNOSIS — R278 Other lack of coordination: Secondary | ICD-10-CM

## 2021-10-16 DIAGNOSIS — C719 Malignant neoplasm of brain, unspecified: Secondary | ICD-10-CM

## 2021-10-16 DIAGNOSIS — M6281 Muscle weakness (generalized): Secondary | ICD-10-CM

## 2021-10-16 DIAGNOSIS — R2681 Unsteadiness on feet: Secondary | ICD-10-CM

## 2021-10-16 NOTE — Therapy (Signed)
OUTPATIENT PHYSICAL THERAPY NEURO TREATMENT  Patient Name: Kelly Rollins MRN: [id number] DOB:Sep 22, 1970, 51 y.o., female Today's Date: 10/16/2021   PCP: [name], PA-C REFERRING PROVIDER: [name], Bonnell Public, PA-C   PT End of Session - 10/16/21 1522     Visit Number 4    Number of Visits 25    Date for PT Re-Evaluation 12/09/21    PT Start Time 1351    PT Stop Time 1430    PT Time Calculation (min) 39 min    Equipment Utilized During Treatment Gait belt    Activity Tolerance Patient tolerated treatment well;No increased pain;Patient limited by fatigue    Behavior During Therapy [facility] for tasks assessed/performed               Past Medical History:  Diagnosis Date   ABDOMINAL PAIN OTHER SPECIFIED SITE 02/21/2009   Qualifier: Diagnosis of  By: Deborra Medina MD, Talia     ABDOMINAL PAIN-LUQ 04/17/2009   Qualifier: Diagnosis of  By: Fuller Plan MD Lamont Snowball T    Anal fissure    Anginal pain ([place])    Asthma    Depression    Diabetes mellitus without complication ([place])    Dyspnea    Dysrhythmia    Headache(784.0)    HEMATOCHEZIA 04/17/2009   Qualifier: Diagnosis of  By: Fuller Plan MD Lamont Snowball T    Hyperlipidemia    Hypertension    IBS (irritable bowel syndrome)    Migraine headache 11/06/2008   Qualifier: Diagnosis of  By: Deborra Medina MD, Talia     OVARIAN CYST, RIGHT 02/21/2009   Qualifier: Diagnosis of  By: Deborra Medina MD, Talia     PALPITATIONS, OCCASIONAL 04/17/2010   Qualifier: Diagnosis of  By: Deborra Medina MD, Talia     Pancreatitis 10/14/2017   Past Surgical History:  Procedure Laterality Date   BREAST EXCISIONAL BIOPSY Left 1997   ? left side, done at time of reduction , benign   BREAST REDUCTION SURGERY     bilateral   CESAREAN SECTION     CHOLECYSTECTOMY     COLONOSCOPY     ESOPHAGOGASTRODUODENOSCOPY     ESOPHAGOGASTRODUODENOSCOPY (EGD) WITH PROPOFOL N/A 10/21/2020   Procedure: ESOPHAGOGASTRODUODENOSCOPY (EGD) WITH PROPOFOL;  Surgeon: Lesly Rubenstein, MD;   Location: [facility] ENDOSCOPY;  Service: Endoscopy;  Laterality: N/A;   REDUCTION MAMMAPLASTY Bilateral 1997   VAGINAL HYSTERECTOMY     Patient Active Problem List   Diagnosis Date Noted   Abnormal MRI, lumbar spine (01/29/2020) 02/19/2020   Anxiety due to MRI 01/25/2020   Claustrophobia associated to MRI 01/25/2020   Enthesopathy of knee region (Right) 01/25/2020   Enthesopathy of knee region (Left) 01/25/2020   Lumbar facet hypertrophy 01/25/2020   Lumbosacral radiculopathy at S1 (Left) 01/17/2020   Lumbosacral radiculopathy (S1) (Left) 01/17/2020   Chronic lower extremity pain (Bilateral) (L>R) 01/17/2020   History of pancreatitis 07/18/2019   Obstructive sleep apnea syndrome 01/02/2019   Lumbar facet arthropathy (Multilevel) (Bilateral) 12/14/2018   Osteoarthritis involving multiple joints 11/28/2018   Chronic hip pain (Left) 11/16/2018   Acute hip pain (Left) 11/16/2018   DDD (degenerative disc disease), lumbosacral 11/16/2018   Chronic hip pain (Secondary source of pain) (Bilateral) (L>R) 05/19/2017   Elevated C-reactive protein (CRP) 05/19/2017   Elevated sed rate 05/19/2017   Spondylosis without myelopathy or radiculopathy, lumbosacral region 05/19/2017   Pharmacologic therapy 05/06/2017   Disorder of skeletal system 05/06/2017   Problems influencing health status 05/06/2017   Chest pain with moderate risk of  acute coronary syndrome 04/21/2017   DOE (dyspnea on exertion) 04/21/2017   Intermittent palpitations 04/21/2017   Vitamin D insufficiency 05/04/2016   Depression 02/04/2016   Fatty liver 02/04/2016   Hypertension associated with diabetes ([place]) 02/04/2016   Chronic pain syndrome 01/15/2016   Acute low back pain 05/28/2015   Lumbar transverse process fracture (HCC) (Left L1) (03/11/15) 05/28/2015   Long term current use of opiate analgesic 05/01/2015   Long term prescription opiate use 05/01/2015   Opiate use (25 MME/Day) 05/01/2015   Encounter for therapeutic drug  level monitoring 05/01/2015   Encounter for pain management planning 05/01/2015   Chronic low back pain (Primary Source of Pain) (Bilateral) (L>R) 05/01/2015   Lumbar facet syndrome (Bilateral) (L>R) 05/01/2015   Chronic knee pain [facility] source of pain) (Bilateral) (L>R) 05/01/2015   Lumbar spondylosis 05/01/2015   Neuropathic pain 05/01/2015   Neurogenic pain 05/01/2015   Myofascial pain 05/01/2015   Essential hypertriglyceridemia 04/26/2014   Type 2 diabetes mellitus with hyperglycemia, with long-term current use of insulin ([place]) 04/26/2014   Anxiety 05/15/2010   Obesity 05/15/2010   Hyperlipidemia 04/03/2010   Fatty liver disease 04/02/2010   Pure hypercholesterolemia 03/19/2010   GERD 04/17/2009   Constipation 04/17/2009   TOBACCO ABUSE 11/06/2008    ONSET DATE: January 2023  REFERRING DIAG: C71.9 (ICD-10-CM) - Malignant neoplasm of brain, unspecified  THERAPY DIAG:  Muscle weakness (generalized)  Other lack of coordination  Unsteadiness on feet  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HCC)  Rationale for Evaluation and Treatment Rehabilitation  SUBJECTIVE:                                                                                                                                                                                              SUBJECTIVE STATEMENT: Pt had rough morning, and reports 5/10 pain currently. She is finishing up round of chemo, feels exhausted. Pt husband present, reports they saw wound specialist, and now pt has new dressing applied to wound to address LE wound on skin on trunk. They report appointment went very well.  Pt accompanied by:  Kelly Rollins  PAIN:  Are you having pain? 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 Pt's husband, Kelly Rollins, present with pt. Kelly Rollins provides majority of hx  as pt has difficulty with speech due to Broca's aphasia (working with SLP). Pt reports partial paralysis of R side. She is unable to move her RUE, RLE also affected. Pt ambulates with hemiwalker in  their home for short distances and does not use WC at home. She fatigues quickly with ambulation. Pt has AFO for RLE but unable to determine how to donn it without it hurting her LE. Pt currently with wound on R calf covered by large bandage that they have been treating for at least a month (reports physician aware). In addition to wound on R calf, pt reports R side skin irritation under breast and lateral trunk due to bra underwire. They have since removed underwire. Pt was hospitalized 10 days in June due to UTI. She has had at least 12 falls in the last six months. Reports no injuries with falls, and states "just bumps and bruises." Pt reports she has constant head pain. Pt spouse reports pt has pain throughout her whole right side down to LE. Can have bad cramps in RUE and RLE. Reports no numbness/tingling. Occ dizziness, unable to describe further. Pt sleeps in recliner, wakes up stiff in the morning. PMH per chart includes: Anorexia, dehydration, Adult Failure to thrive, contracture of muscles multiple sites. Pt./caregiver reports recently being hospitalized with a UTI, and dehydration. Extensive hx, please refer to chart for full details.   PAIN:  Are you having pain? Yes: NPRS scale: 3/10 Pain location: Top of head, front Pain description: States, "cancer pain" head pain Aggravating factors: unsure Relieving factors: medications  PRECAUTIONS: Fall  WEIGHT BEARING RESTRICTIONS No  FALLS: Has patient fallen in last 6 months? Yes. Number of falls 12  LIVING ENVIRONMENT: information primarily from chart Lives with: lives with their spouse Lives in: House/apartment Stairs: Yes: External: 4 steps; bilateral but cannot reach both Has following equipment at home: Single point cane, Walker - 4 wheeled,  Hemi walker, shower chair, bed side commode, Grab bars, and lift chair  PLOF: Independent  PATIENT GOALS  "Normal function" as much as possible. Improving her balance.  OBJECTIVE:   DIAGNOSTIC FINDINGS:   MR BRAIN W WO CONTRAST 02/06/2021:  "IMPRESSION: Masslike T2 signal in the anterior left frontal lobe, without associated enhancement, most concerning for a low-grade glioma. Additional areas of increased T2 signal in the left frontal lobe may also infiltrative low-grade glioma or associated edema."  CT HEAD WO CONTRAST (5MM) 02/06/2021: "IMPRESSION: High left anterior frontal edema with new adjacent masslike hyperdensity involving the left frontal white matter, high left left frontal cortex, and genu of corpus callosum. Overall, findings are suspicious for malignancy (including hypercellular tumors such as GBM and lymphoma). Acute or subacute left ACA territory infarct is a differential consideration, but thought less likely. Recommend MRI with contrast for further evaluation."   COGNITION: Overall cognitive status: Within functional limits for tasks assessed   SENSATION: Impaired in RLE - diminished and absent throughout R ankle/foot - noted wound on calf - LLE intact to light touch   COORDINATION:  Finger to chin with LUE - few errors otherwise able to complete. Unable to perform RUE.  EDEMA: noted B ankles   MUSCLE TONE: RLE: Mild-moderate. Pt unable to get R heel on floor in weightbearing position, plantarflexed  LOWER EXTREMITY MMT:     LLE grossly 4/5 throughout Trace activation RLE in chair with majority of muscles tested. RLE generally <3/5. Exception R hip abductors 3-/5, would benefit from further assessment in supine.    TRANSFERS: Assistive device utilized: Hemi walker  Sit to stand:  Attempted 5xSTS in session, unable to complete but did perform 2x in session with mod a.  Spouse reports pt able to perform transfers with 20%  help at home from chair of  increased height Stand to sit: Mod Ax2   GAIT: Gait pattern: step to pattern, RLE fixed in PF, externally rotated, uses L hemiwalker Distance walked: 20 ft Assistive device utilized: Hemi walker Level of assistance:  close CGA Comments: heavy LUE weightbearing on HW  FUNCTIONAL TESTs:  5 times sit to stand: unable to complete from standard chair height, performs 1 STS  PATIENT SURVEYS:  FOTO unable to complete due to error indicating FOTO not available in system  TODAY'S TREATMENT:   Therex: Seated DF 2x20-22 AROM LLE, PROM/AAROM RLE  Heel raises 2x20-22 AROM LLE, PROM RLE  LAQ 2x20 AROM LLE fatiguing  PT- assisted hamstring stretch 60 sec RLE  Soccer ball kicks with focus on RLE, large-amplitude cues, with and without cone target to knock over x multiple reps. PT additionally provides AAROM for a few reps and TC. Pt does exhibit some improvement following cues and AAROM, but quickly fatigues.    Standing endurance at support bar x 4 min with UUE support CGA-min a and occ block R knee  STS 2x, pulling on support bar, min assist.  Pt fatigued today. Closing eyes throughout treatment due to sleepiness/fatigue. Pt able to follow all cues and refocus on task with cuing.   Rest breaks throughout.  PT provided education on repositioning of RLE to reduce clonic movement.  PATIENT EDUCATION: Education details: Pt educated throughout session about proper posture and technique with exercises. Improved exercise technique, movement at target joints, use of target muscles after min to mod verbal, visual, tactile cues.  Person educated: pt and her spouse Education method:Explanation, Demonstration, Tactile cues, and Verbal cues Education comprehension: verbalized understanding, returned demonstration, tactile cues required, and needs further education   HOME EXERCISE PROGRAM: No updates on this date, pt to continue HEP as previously given Access Code: [email or web address] URL:  [email or web address] Date: 10/02/2021 Prepared by: Sande Brothers  Exercises - Seated Hip Flexion March with Ankle Weights  - 1 x daily - 7 x weekly - 3 sets - 10 reps - Seated Long Arc Quad  - 1 x daily - 7 x weekly - 3 sets - 10 reps - Supine Quad Set  - 1 x daily - 7 x weekly - 3 sets - 10 reps - Proper Sit to Stand Technique  - 1 x daily - 7 x weekly - 3 sets - 10 reps  GOALS: Goals reviewed with patient? Yes  SHORT TERM GOALS: Target date: 10/28/2021  Patient will be independent in home exercise program to improve strength/mobility for better functional independence with ADLs. Baseline: to be initiated next 1-2 sessions Goal status: INITIAL   LONG TERM GOALS: Target date: 12/09/2021  Patient will increase FOTO score to equal to or greater than     to demonstrate statistically significant improvement in mobility and quality of life.  Baseline: to be initiated next 1-2 visits Goal status: INITIAL  2.   Patient (< 57 years old) will be able to perform and complete five times sit to stand test from standard chair height in <30 seconds to indicate increased LE strength and improved balance. Baseline: Pt able to complete only 1 stand, insufficient strength currently to complete full test Goal status: INITIAL  3.  Pt will improve LLE strength by at least 1/2 point on MMT in order to increase ease with transfers and functional mobility. Baseline: LLE grossly 4/5 throughout Goal status: INITIAL  4.  Patient will increase 10 meter walk test to  at least 0.80 m/s as to improve gait speed for better community ambulation and to reduce fall risk. Baseline: to be tested next 1-2 visits Goal status: INITIAL  5.  Pt will complete STS from standard height chair with BUE assist off armrests and with no greater than min assist to improve ease and safety with transfers. Baseline: mod a x2 Goal status: INITIAL   ASSESSMENT:  CLINICAL IMPRESSION: Pt presents today with  significant fatigue secondary to chemo, session somewhat limited. Majority of appointment focused on improving RLE quad activation, and gentle therex in wheelchair to pt tolerance. However, pt was still able to perform standing endurance exercise for 4 minutes with improved R knee stability. The pt would benefit from continued skilled PT to address Her immobility, LE muscle weakness, and pain in order to decrease fall risk and increase functional mobility and QOL.   OBJECTIVE IMPAIRMENTS Abnormal gait, decreased activity tolerance, decreased balance, decreased coordination, decreased endurance, decreased mobility, difficulty walking, decreased ROM, decreased strength, hypomobility, increased edema, increased fascial restrictions, increased muscle spasms, impaired flexibility, impaired sensation, impaired tone, impaired UE functional use, improper body mechanics, postural dysfunction, and pain.   ACTIVITY LIMITATIONS carrying, lifting, bending, sitting, standing, squatting, stairs, transfers, bed mobility, bathing, toileting, dressing, reach over head, hygiene/grooming, locomotion level, and caring for others  PARTICIPATION LIMITATIONS: meal prep, cleaning, laundry, medication management, personal finances, driving, shopping, community activity, occupation, and yard work  PERSONAL Education officer, community, Past/current experiences, Sex, and 3+ comorbidities: Anorexia, dehydration, Adult Failure to thrive, contracture of muscles multiple sites, multiple falls, UTI, extensive PMH refer to chart  are also affecting patient's functional outcome.   REHAB POTENTIAL: Fair    CLINICAL DECISION MAKING: Evolving/moderate complexity  EVALUATION COMPLEXITY: High  PLAN: PT FREQUENCY: 2x/week  PT DURATION: 12 weeks  PLANNED INTERVENTIONS: Therapeutic exercises, Therapeutic activity, Neuromuscular re-education, Balance training, Gait training, Patient/Family education, Self Care, Joint mobilization, Joint manipulation,  Stair training, Vestibular training, Canalith repositioning, Orthotic/Fit training, DME instructions, Wheelchair mobility training, Spinal mobilization, Cryotherapy, Moist heat, scar mobilization, Splintting, Taping, Manual therapy, and Re-evaluation  PLAN FOR NEXT SESSION:  stretching, strengthening, gait, balance, continue plan   Zollie Pee, PT 10/16/2021, 4:04 PM

## 2021-10-16 NOTE — Therapy (Signed)
OUTPATIENT OCCUPATIONAL THERAPY TREATMENT  Patient Name: Kelly Rollins MRN: [id number] DOB:01/21/1971, 51 y.o., female Today's Date: 10/16/2021  PCP: Kelly Rossetti, PA-C REFERRING PROVIDER: Elnora Rollins, Corrine Jeani Hawking, PA-C   OT End of Session - 10/16/21 1423     Visit Number 15    Number of Visits 24    Date for OT Re-Evaluation 10/27/21    Authorization Time Period Progress reports period starting 08/04/2021    OT Start Time 1430    OT Stop Time 1506    OT Time Calculation (min) 36 min    Activity Tolerance Patient tolerated treatment well    Behavior During Therapy [facility] for tasks assessed/performed             Past Medical History:  Diagnosis Date   ABDOMINAL PAIN OTHER SPECIFIED SITE 02/21/2009   Qualifier: Diagnosis of  By: Kelly Rollins, Kelly     ABDOMINAL PAIN-LUQ 04/17/2009   Qualifier: Diagnosis of  By: Rollins Plan Rollins Kelly Rollins T    Anal fissure    Anginal pain ([place])    Asthma    Depression    Diabetes mellitus without complication ([place])    Dyspnea    Dysrhythmia    Headache(784.0)    HEMATOCHEZIA 04/17/2009   Qualifier: Diagnosis of  By: Rollins Plan Rollins Kelly Rollins T    Hyperlipidemia    Hypertension    IBS (irritable bowel syndrome)    Migraine headache 11/06/2008   Qualifier: Diagnosis of  By: Kelly Rollins, Kelly     OVARIAN CYST, RIGHT 02/21/2009   Qualifier: Diagnosis of  By: Kelly Rollins, Kelly     PALPITATIONS, OCCASIONAL 04/17/2010   Qualifier: Diagnosis of  By: Kelly Rollins, Kelly     Pancreatitis 10/14/2017   Past Surgical History:  Procedure Laterality Date   BREAST EXCISIONAL BIOPSY Left 1997   ? left side, done at time of reduction , benign   BREAST REDUCTION SURGERY     bilateral   CESAREAN SECTION     CHOLECYSTECTOMY     COLONOSCOPY     ESOPHAGOGASTRODUODENOSCOPY     ESOPHAGOGASTRODUODENOSCOPY (EGD) WITH PROPOFOL N/A 10/21/2020   Procedure: ESOPHAGOGASTRODUODENOSCOPY (EGD) WITH PROPOFOL;  Surgeon: Lesly Rubenstein, Rollins;  Location: [facility] ENDOSCOPY;   Service: Endoscopy;  Laterality: N/A;   REDUCTION MAMMAPLASTY Bilateral 1997   VAGINAL HYSTERECTOMY     Patient Active Problem List   Diagnosis Date Noted   Abnormal MRI, lumbar spine (01/29/2020) 02/19/2020   Anxiety due to MRI 01/25/2020   Claustrophobia associated to MRI 01/25/2020   Enthesopathy of knee region (Right) 01/25/2020   Enthesopathy of knee region (Left) 01/25/2020   Lumbar facet hypertrophy 01/25/2020   Lumbosacral radiculopathy at S1 (Left) 01/17/2020   Lumbosacral radiculopathy (S1) (Left) 01/17/2020   Chronic lower extremity pain (Bilateral) (L>R) 01/17/2020   History of pancreatitis 07/18/2019   Obstructive sleep apnea syndrome 01/02/2019   Lumbar facet arthropathy (Multilevel) (Bilateral) 12/14/2018   Osteoarthritis involving multiple joints 11/28/2018   Chronic hip pain (Left) 11/16/2018   Acute hip pain (Left) 11/16/2018   DDD (degenerative disc disease), lumbosacral 11/16/2018   Chronic hip pain (Secondary source of pain) (Bilateral) (L>R) 05/19/2017   Elevated C-reactive protein (CRP) 05/19/2017   Elevated sed rate 05/19/2017   Spondylosis without myelopathy or radiculopathy, lumbosacral region 05/19/2017   Pharmacologic therapy 05/06/2017   Disorder of skeletal system 05/06/2017   Problems influencing health status 05/06/2017   Chest pain with moderate risk of acute coronary syndrome 04/21/2017   DOE (  dyspnea on exertion) 04/21/2017   Intermittent palpitations 04/21/2017   Vitamin D insufficiency 05/04/2016   Depression 02/04/2016   Fatty liver 02/04/2016   Hypertension associated with diabetes ([place]) 02/04/2016   Chronic pain syndrome 01/15/2016   Acute low back pain 05/28/2015   Lumbar transverse process fracture (HCC) (Left L1) (03/11/15) 05/28/2015   Long term current use of opiate analgesic 05/01/2015   Long term prescription opiate use 05/01/2015   Opiate use (25 MME/Day) 05/01/2015   Encounter for therapeutic drug level monitoring 05/01/2015    Encounter for pain management planning 05/01/2015   Chronic low back pain (Primary Source of Pain) (Bilateral) (L>R) 05/01/2015   Lumbar facet syndrome (Bilateral) (L>R) 05/01/2015   Chronic knee pain [facility] source of pain) (Bilateral) (L>R) 05/01/2015   Lumbar spondylosis 05/01/2015   Neuropathic pain 05/01/2015   Neurogenic pain 05/01/2015   Myofascial pain 05/01/2015   Essential hypertriglyceridemia 04/26/2014   Type 2 diabetes mellitus with hyperglycemia, with long-term current use of insulin ([place]) 04/26/2014   Anxiety 05/15/2010   Obesity 05/15/2010   Hyperlipidemia 04/03/2010   Fatty liver disease 04/02/2010   Pure hypercholesterolemia 03/19/2010   GERD 04/17/2009   Constipation 04/17/2009   TOBACCO ABUSE 11/06/2008    ONSET DATE: 02/06/2021  REFERRING DIAG: Astrocytoma  THERAPY DIAG:  Unsteadiness on feet  Other lack of coordination  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HCC)  Muscle weakness (generalized)  Rationale for Evaluation and Treatment Rehabilitation  SUBJECTIVE:     SUBJECTIVE STATEMENT: Pt reports she is almost finished with chemo and is looking forward to having more energy.   Pt accompanied by: significant other  PERTINENT HISTORY:  Pt. Is a 51 y.o. 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 PMHx includes: Anorexia, dehydration, Adult Failure to thrive, contracture of muscles multiple sites. Pt./caregiver reports recently being hospitalized with a UTI, and dehydration.   PRECAUTIONS: Fall  WEIGHT BEARING RESTRICTIONS No  PAIN:  Are you having pain?  Pt. reports 4/10 pain in the entire R side.   FALLS: Has patient fallen in last 6 months? Yes. Number of falls Multiple Falls  LIVING ENVIRONMENT: Lives with: Family Lives in: House/apartment Stairs: Yes: External: 4 steps; bilateral but cannot reach both Has following equipment at home: Single point cane, Walker -  4 wheeled, Hemi walker, shower chair, bed side commode, and Grab bars Lift chair in the tub, [place],  PLOF: Independent  PATIENT GOALS: To improve the right UE  OBJECTIVE:   HAND DOMINANCE: Right  ADLs: Transfers/ambulation related to ADLs: Eating: MaxA with cutting food, setting up, Modified I with nondominant Left hand, [place]  Grooming: Independent  UB Dressing: MaxA  LB Dressing: MaxA Toileting: Uses a BSCommode mInA transfers, MaxA clothing negotiation Bathing: MaxA Tub Shower transfers:  Uses a shower lift chair Equipment:  w/c, BSCommode, cane   IADLs: Shopping: Husband performs Light housekeeping: Max Meal Prep: MaxA Community mobility: Relies on family Medication management:Husband assists Doctor, hospital: Performs together Handwriting: Not legible  MOBILITY STATUS: Hx of falls    ACTIVITY TOLERANCE: Activity tolerance: limited  FUNCTIONAL OUTCOME MEASURES: FOTO: 11.76  UPPER EXTREMITY ROM  : WFL Left AROM/PROM   Passive ROM Right eval Right  08/18/21  Shoulder flexion 30   Shoulder abduction 40 62  Shoulder adduction    Shoulder extension    Shoulder internal rotation    Shoulder external rotation    Elbow flexion 120   Elbow  extension -67   Wrist flexion Neutral   Wrist extension 12   Wrist ulnar deviation    Wrist radial deviation    Wrist pronation    Wrist supination    (Blank rows = not tested)   UPPER EXTREMITY MMT:     MMT Right eval Left eval  Shoulder flexion 0/5 4+/5  Shoulder abduction 0/5 4+/5  Shoulder adduction    Shoulder extension    Shoulder internal rotation    Shoulder external rotation    Middle trapezius    Lower trapezius    Elbow flexion 0/5 4+/5  Elbow extension 0/5 4+/5  Wrist flexion    Wrist extension 0/5 4+/5  Wrist ulnar deviation    Wrist radial deviation    Wrist pronation    Wrist supination    (Blank rows = not tested)    SENSATION: Light touch: WFL  EDEMA:   Wrist: 18.5  cm MCP: 21.5 cm    MUSCLE TONE: RUE: Severe  COGNITION: Overall cognitive status: Impaired  VISION: Subjective report: Wears glasses Baseline vision: Wears glasses all the time Pt. Reports no changes    PRAXIS: Impaired: Motor planning                TODAY'S TREATMENT:   Pt tolerated retrograde massage to the right hand, and digits secondary to edema - pr reports decreased pain during ROM following massage. Pt tolerated PROM for right wrist flexion and extension, forearm supination/pronation, digit flexion and extension, and thumb abduction. Pt/caregiver education was provided about positioning for edema mgmt, importance of skin checks to prevent breakdown, and PROM HEP. Pt. continues to tolerate the palm protector with 1" roll without skin irritation, or breakdown in the right palm.     PATIENT EDUCATION: Education details: Right UE ROM, positioning of the RUE Person educated: Patient and Spouse Education method: Explanation, Demonstration, Tactile cues, and Verbal cues Education comprehension: verbalized understanding, returned demonstration, and needs further education   HOME EXERCISE PROGRAM:   Pt./caregiver education about positioning of the RUE, ROM    GOALS: Goals reviewed with patient? Yes  SHORT TERM GOALS: Target date: 09/15/2021    Pt./caregivers with demonstrate independence with HEPs for UE ROM, and edema control Baseline: No current HEPs Goal status: INITIAL    LONG TERM GOALS: Target date: 10/27/2021    Pt. Will improve right hand edema by 1 cm at the wrist, and MCPs Baseline: Eval: Wrist: 18.5cm, MCPs: 21.5cm Goal status: INITIAL  2.  Pt. Will improve right shoulder PROM by 10 degrees with ADLs Baseline: Eval: PROM shoulder flexion: 30 degrees, Abduction: 40 degrees Goal status: INITIAL  3.  Pt. Will improve right elbow PROM by 10 degrees to prepare for hand to mouth patterns Baseline: Eval:   elbow extension -67, flexion 120 Goal status:  INITIAL  4.  Pt. Will improve PROM right wrist flexion, and extension by 10 degrees Baseline: Eval: wrist extension 12 degrees, flexion to neutral Goal status: INITIAL  5.  Pt. Will tolerate full digit extension in the right hand to promote good skin integrity through the palmar surface of the right hand  Baseline: Pt. Maintains digits in tight flexion Goal status: INITIAL  6.  Pt. Will require minA UE dressing using one armed dressing techniques. Baseline: MaxA Goal status: INITIAL  ASSESSMENT:  CLINICAL IMPRESSION:  Pt presents with less pain, and edema in the right wrist, and distal forearm today, although reports 4/10 pain in the R shoulder and LE. Pt.'s right shoulder  flexion, abduction, and digit extension ROM continues to be limited by pain, however pt continues to be able to tolerate slightly more abduction following moist heat pack modality to the right shoulder and soft tissue massage to the wrist/forearm/hand. Pt continues to initially maintain the digits on her right hand in tight flexor synergy pattern. Discussed skin integrity and edema mgmt strategies. Pt continues to work on improving pain, and improving RUE ROM in order to improve engagement with ADLs/IADLs, and promote good skin integrity through the palmar surface of the hand.    PERFORMANCE DEFICITS in functional skills including ADLs, IADLs, coordination, proprioception, sensation, edema, tone, ROM, strength, pain, FMC, GMC, balance, decreased knowledge of use of DME, skin integrity, and UE functional use, cognitive skills including attention, memory, and safety awareness, and psychosocial skills including coping strategies.   IMPAIRMENTS are limiting patient from ADLs, IADLs, education, work, leisure, and social participation.   COMORBIDITIES may have co-morbidities  that affects occupational performance. Patient will benefit from skilled OT to address above impairments and improve overall function.  MODIFICATION OR  ASSISTANCE TO COMPLETE EVALUATION: Maximum modification of tasks or assist with assess necessary to complete an evaluation.  OT OCCUPATIONAL PROFILE AND HISTORY: Detailed assessment: Review of records and additional review of physical, cognitive, psychosocial history related to current functional performance.  CLINICAL DECISION MAKING: High - multiple treatment options, significant modification of task necessary  REHAB POTENTIAL: Good for stated goals  EVALUATION COMPLEXITY: High    PLAN: OT FREQUENCY: 2x/week  OT DURATION: 12 weeks  PLANNED INTERVENTIONS: self care/ADL training, therapeutic exercise, therapeutic activity, neuromuscular re-education, manual therapy, passive range of motion, paraffin, moist heat, contrast bath, patient/family education, cognitive remediation/compensation, energy conservation, and coping strategies training  RECOMMENDED OTHER SERVICES: PT, and ST  CONSULTED AND AGREED WITH PLAN OF CARE: Patient and family member/caregiver  PLAN FOR NEXT SESSION: Initiate PROM, and RUE Edema control techniques   Dessie Coma, M.S. OTR/L  10/16/21, 3:07 PM  ascom [phone]   Leonides Cave, OT 10/16/2021, 3:07 PM

## 2021-10-21 ENCOUNTER — Ambulatory Visit: Payer: Medicare Other

## 2021-10-21 ENCOUNTER — Ambulatory Visit: Payer: Medicare Other | Admitting: Speech Pathology

## 2021-10-21 ENCOUNTER — Encounter: Payer: Self-pay | Admitting: Occupational Therapy

## 2021-10-21 ENCOUNTER — Ambulatory Visit: Payer: Medicare Other | Admitting: Occupational Therapy

## 2021-10-21 DIAGNOSIS — R2681 Unsteadiness on feet: Secondary | ICD-10-CM

## 2021-10-21 DIAGNOSIS — C719 Malignant neoplasm of brain, unspecified: Secondary | ICD-10-CM

## 2021-10-21 DIAGNOSIS — M6281 Muscle weakness (generalized): Secondary | ICD-10-CM | POA: Diagnosis not present

## 2021-10-21 DIAGNOSIS — R278 Other lack of coordination: Secondary | ICD-10-CM

## 2021-10-21 DIAGNOSIS — R262 Difficulty in walking, not elsewhere classified: Secondary | ICD-10-CM

## 2021-10-21 DIAGNOSIS — R4701 Aphasia: Secondary | ICD-10-CM

## 2021-10-21 NOTE — Therapy (Signed)
OUTPATIENT OCCUPATIONAL THERAPY TREATMENT  Patient Name: Kelly Rollins MRN: [id number] DOB:[birth year], 51 y.o., female Today's Date: 10/21/2021  PCP: Kelly Rossetti, PA-C REFERRING PROVIDER: Elnora Rollins, Corrine Jeani Hawking, PA-C   OT End of Session - 10/21/21 1604     Visit Number 16    Number of Visits 24    Date for OT Re-Evaluation 10/27/21    Authorization Time Period Progress reports period starting 08/04/2021    OT Start Time 1600    OT Stop Time 1645    OT Time Calculation (min) 45 min    Activity Tolerance Patient tolerated treatment well    Behavior During Therapy [facility] for tasks assessed/performed             Past Medical History:  Diagnosis Date   ABDOMINAL PAIN OTHER SPECIFIED SITE 02/21/2009   Qualifier: Diagnosis of  By: Deborra Medina MD, Talia     ABDOMINAL PAIN-LUQ 04/17/2009   Qualifier: Diagnosis of  By: Fuller Plan MD Lamont Snowball T    Anal fissure    Anginal pain ([place])    Asthma    Depression    Diabetes mellitus without complication ([place])    Dyspnea    Dysrhythmia    Headache(784.0)    HEMATOCHEZIA 04/17/2009   Qualifier: Diagnosis of  By: Fuller Plan MD Lamont Snowball T    Hyperlipidemia    Hypertension    IBS (irritable bowel syndrome)    Migraine headache 11/06/2008   Qualifier: Diagnosis of  By: Deborra Medina MD, Talia     OVARIAN CYST, RIGHT 02/21/2009   Qualifier: Diagnosis of  By: Deborra Medina MD, Talia     PALPITATIONS, OCCASIONAL 04/17/2010   Qualifier: Diagnosis of  By: Deborra Medina MD, Talia     Pancreatitis 10/14/2017   Past Surgical History:  Procedure Laterality Date   BREAST EXCISIONAL BIOPSY Left 1997   ? left side, done at time of reduction , benign   BREAST REDUCTION SURGERY     bilateral   CESAREAN SECTION     CHOLECYSTECTOMY     COLONOSCOPY     ESOPHAGOGASTRODUODENOSCOPY     ESOPHAGOGASTRODUODENOSCOPY (EGD) WITH PROPOFOL N/A 10/21/2020   Procedure: ESOPHAGOGASTRODUODENOSCOPY (EGD) WITH PROPOFOL;  Surgeon: Lesly Rubenstein, MD;  Location: [facility] ENDOSCOPY;   Service: Endoscopy;  Laterality: N/A;   REDUCTION MAMMAPLASTY Bilateral 1997   VAGINAL HYSTERECTOMY     Patient Active Problem List   Diagnosis Date Noted   Abnormal MRI, lumbar spine (01/29/2020) 02/19/2020   Anxiety due to MRI 01/25/2020   Claustrophobia associated to MRI 01/25/2020   Enthesopathy of knee region (Right) 01/25/2020   Enthesopathy of knee region (Left) 01/25/2020   Lumbar facet hypertrophy 01/25/2020   Lumbosacral radiculopathy at S1 (Left) 01/17/2020   Lumbosacral radiculopathy (S1) (Left) 01/17/2020   Chronic lower extremity pain (Bilateral) (L>R) 01/17/2020   History of pancreatitis 07/18/2019   Obstructive sleep apnea syndrome 01/02/2019   Lumbar facet arthropathy (Multilevel) (Bilateral) 12/14/2018   Osteoarthritis involving multiple joints 11/28/2018   Chronic hip pain (Left) 11/16/2018   Acute hip pain (Left) 11/16/2018   DDD (degenerative disc disease), lumbosacral 11/16/2018   Chronic hip pain (Secondary source of pain) (Bilateral) (L>R) 05/19/2017   Elevated C-reactive protein (CRP) 05/19/2017   Elevated sed rate 05/19/2017   Spondylosis without myelopathy or radiculopathy, lumbosacral region 05/19/2017   Pharmacologic therapy 05/06/2017   Disorder of skeletal system 05/06/2017   Problems influencing health status 05/06/2017   Chest pain with moderate risk of acute coronary syndrome 04/21/2017   DOE (  dyspnea on exertion) 04/21/2017   Intermittent palpitations 04/21/2017   Vitamin D insufficiency 05/04/2016   Depression 02/04/2016   Fatty liver 02/04/2016   Hypertension associated with diabetes ([place]) 02/04/2016   Chronic pain syndrome 01/15/2016   Acute low back pain 05/28/2015   Lumbar transverse process fracture (HCC) (Left L1) (03/11/15) 05/28/2015   Long term current use of opiate analgesic 05/01/2015   Long term prescription opiate use 05/01/2015   Opiate use (25 MME/Day) 05/01/2015   Encounter for therapeutic drug level monitoring 05/01/2015    Encounter for pain management planning 05/01/2015   Chronic low back pain (Primary Source of Pain) (Bilateral) (L>R) 05/01/2015   Lumbar facet syndrome (Bilateral) (L>R) 05/01/2015   Chronic knee pain [facility] source of pain) (Bilateral) (L>R) 05/01/2015   Lumbar spondylosis 05/01/2015   Neuropathic pain 05/01/2015   Neurogenic pain 05/01/2015   Myofascial pain 05/01/2015   Essential hypertriglyceridemia 04/26/2014   Type 2 diabetes mellitus with hyperglycemia, with long-term current use of insulin ([place]) 04/26/2014   Anxiety 05/15/2010   Obesity 05/15/2010   Hyperlipidemia 04/03/2010   Fatty liver disease 04/02/2010   Pure hypercholesterolemia 03/19/2010   GERD 04/17/2009   Constipation 04/17/2009   TOBACCO ABUSE 11/06/2008    ONSET DATE: 02/06/2021  REFERRING DIAG: Astrocytoma  THERAPY DIAG:  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   Pt accompanied by: significant other  PERTINENT HISTORY:  Pt. Is a 51 y.o. 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 PMHx includes: Anorexia, dehydration, Adult Failure to thrive, contracture of muscles multiple sites. Pt./caregiver reports recently being hospitalized with a UTI, and dehydration.   PRECAUTIONS: Fall  WEIGHT BEARING RESTRICTIONS No  PAIN:  Are you having pain?  Pt. reports 6/10 pain in the entire R side.   FALLS: Has patient fallen in last 6 months? Yes. Number of falls Multiple Falls  LIVING ENVIRONMENT: Lives with: Family Lives in: House/apartment Stairs: Yes: External: 4 steps; bilateral but cannot reach both Has following equipment at home: Single point cane, Walker - 4 wheeled, Hemi walker, shower chair, bed side commode, and  Grab bars Lift chair in the tub, [place],  PLOF: Independent  PATIENT GOALS: To improve the right UE  OBJECTIVE:   HAND DOMINANCE: Right  ADLs: Transfers/ambulation related to ADLs: Eating: MaxA with cutting food, setting up, Modified I with nondominant Left hand, [place]  Grooming: Independent  UB Dressing: MaxA  LB Dressing: MaxA Toileting: Uses a BSCommode mInA transfers, MaxA clothing negotiation Bathing: MaxA Tub Shower transfers:  Uses a shower lift chair Equipment:  w/c, BSCommode, cane   IADLs: Shopping: Husband performs Light housekeeping: Max Meal Prep: MaxA Community mobility: Relies on family Medication management:Husband assists Doctor, hospital: Performs together Handwriting: Not legible  MOBILITY STATUS: Hx of falls    ACTIVITY TOLERANCE: Activity tolerance: limited  FUNCTIONAL OUTCOME MEASURES: FOTO: 11.76  UPPER EXTREMITY ROM  : WFL Left AROM/PROM   Passive ROM Right eval Right  08/18/21  Shoulder flexion 30   Shoulder abduction 40 62  Shoulder adduction    Shoulder extension    Shoulder internal rotation    Shoulder external rotation    Elbow flexion 120   Elbow extension -67   Wrist flexion Neutral   Wrist  extension 12   Wrist ulnar deviation    Wrist radial deviation    Wrist pronation    Wrist supination    (Blank rows = not tested)   UPPER EXTREMITY MMT:     MMT Right eval Left eval  Shoulder flexion 0/5 4+/5  Shoulder abduction 0/5 4+/5  Shoulder adduction    Shoulder extension    Shoulder internal rotation    Shoulder external rotation    Middle trapezius    Lower trapezius    Elbow flexion 0/5 4+/5  Elbow extension 0/5 4+/5  Wrist flexion    Wrist extension 0/5 4+/5  Wrist ulnar deviation    Wrist radial deviation    Wrist pronation    Wrist supination    (Blank rows = not tested)    SENSATION: Light touch: WFL  EDEMA:   Wrist: 18.5 cm MCP: 21.5 cm    MUSCLE TONE: RUE:  Severe  COGNITION: Overall cognitive status: Impaired  VISION: Subjective report: Wears glasses Baseline vision: Wears glasses all the time Pt. Reports no changes    PRAXIS: Impaired: Motor planning                TODAY'S TREATMENT:   Therapeutic Exercise: Pt tolerated retrograde massage to the right hand, and digits secondary to edema - pt reports decreased pain during ROM following heat and massage. Pt reports 6/10 pain in R shoulder improving to 2/10 at end of session. Pt tolerated PROM for right wrist flexion and extension, forearm supination/pronation, digit flexion and extension, and thumb abduction. Discussed use of heating at home for pain management and lumbar roll for posture. Pt completed 10 reps of scapular retraction - instructed on HEP for improved posture.     PATIENT EDUCATION: Education details: Right UE ROM, positioning of the RUE Person educated: Patient and Spouse Education method: Explanation, Demonstration, Tactile cues, and Verbal cues Education comprehension: verbalized understanding, returned demonstration, and needs further education   HOME EXERCISE PROGRAM:   Pt./caregiver education about positioning of the RUE, ROM    GOALS: Goals reviewed with patient? Yes  SHORT TERM GOALS: Target date: 09/15/2021    Pt./caregivers with demonstrate independence with HEPs for UE ROM, and edema control Baseline: No current HEPs Goal status: INITIAL    LONG TERM GOALS: Target date: 10/27/2021    Pt. Will improve right hand edema by 1 cm at the wrist, and MCPs Baseline: Eval: Wrist: 18.5cm, MCPs: 21.5cm Goal status: INITIAL  2.  Pt. Will improve right shoulder PROM by 10 degrees with ADLs Baseline: Eval: PROM shoulder flexion: 30 degrees, Abduction: 40 degrees Goal status: INITIAL  3.  Pt. Will improve right elbow PROM by 10 degrees to prepare for hand to mouth patterns Baseline: Eval:   elbow extension -67, flexion 120 Goal status: INITIAL  4.  Pt.  Will improve PROM right wrist flexion, and extension by 10 degrees Baseline: Eval: wrist extension 12 degrees, flexion to neutral Goal status: INITIAL  5.  Pt. Will tolerate full digit extension in the right hand to promote good skin integrity through the palmar surface of the right hand  Baseline: Pt. Maintains digits in tight flexion Goal status: INITIAL  6.  Pt. Will require minA UE dressing using one armed dressing techniques. Baseline: MaxA Goal status: INITIAL  ASSESSMENT:  CLINICAL IMPRESSION:  Pt presents with increased pain 6/10 R shoulder at start of session improving to 2/10 end of session following heat, massage, and PROM. Pt continues to initially maintain the digits  on her right hand in tight flexor synergy pattern, greatly improved after PROM. Discussed skin integrity and edema mgmt strategies. Pt continues to work on improving pain, and improving RUE ROM in order to improve engagement with ADLs/IADLs, and promote good skin integrity through the palmar surface of the hand.    PERFORMANCE DEFICITS in functional skills including ADLs, IADLs, coordination, proprioception, sensation, edema, tone, ROM, strength, pain, FMC, GMC, balance, decreased knowledge of use of DME, skin integrity, and UE functional use, cognitive skills including attention, memory, and safety awareness, and psychosocial skills including coping strategies.   IMPAIRMENTS are limiting patient from ADLs, IADLs, education, work, leisure, and social participation.   COMORBIDITIES may have co-morbidities  that affects occupational performance. Patient will benefit from skilled OT to address above impairments and improve overall function.  MODIFICATION OR ASSISTANCE TO COMPLETE EVALUATION: Maximum modification of tasks or assist with assess necessary to complete an evaluation.  OT OCCUPATIONAL PROFILE AND HISTORY: Detailed assessment: Review of records and additional review of physical, cognitive, psychosocial  history related to current functional performance.  CLINICAL DECISION MAKING: High - multiple treatment options, significant modification of task necessary  REHAB POTENTIAL: Good for stated goals  EVALUATION COMPLEXITY: High    PLAN: OT FREQUENCY: 2x/week  OT DURATION: 12 weeks  PLANNED INTERVENTIONS: self care/ADL training, therapeutic exercise, therapeutic activity, neuromuscular re-education, manual therapy, passive range of motion, paraffin, moist heat, contrast bath, patient/family education, cognitive remediation/compensation, energy conservation, and coping strategies training  RECOMMENDED OTHER SERVICES: PT, and ST  CONSULTED AND AGREED WITH PLAN OF CARE: Patient and family member/caregiver  PLAN FOR NEXT SESSION: Initiate PROM, and RUE Edema control techniques   Dessie Coma, M.S. OTR/L  10/21/21, 4:05 PM  ascom [phone]   Leonides Cave, OT 10/21/2021, 4:05 PM

## 2021-10-21 NOTE — Therapy (Signed)
OUTPATIENT PHYSICAL THERAPY NEURO TREATMENT  Patient Name: Kelly Rollins MRN: [id number] DOB:[birth year], 51 y.o., female Today's Date: 10/22/2021   PCP: Kelly Rossetti, PA-C REFERRING PROVIDER: Elnora Rollins, Kelly Public, PA-C   PT End of Session - 10/22/21 1021     Visit Number 5    Number of Visits 25    Date for PT Re-Evaluation 12/09/21    PT Start Time 6578    PT Stop Time 1429    PT Time Calculation (min) 40 min    Equipment Utilized During Treatment Gait belt    Activity Tolerance Patient tolerated treatment well;No increased pain;Patient limited by fatigue    Behavior During Therapy Kelly Rollins for tasks assessed/performed                Past Medical History:  Diagnosis Date   ABDOMINAL PAIN OTHER SPECIFIED SITE 02/21/2009   Qualifier: Diagnosis of  By: Kelly Medina MD, Kelly Rollins     ABDOMINAL PAIN-LUQ 04/17/2009   Qualifier: Diagnosis of  By: Kelly Plan MD Kelly Rollins    Anal fissure    Anginal pain ([place])    Asthma    Depression    Diabetes mellitus without complication ([place])    Dyspnea    Dysrhythmia    Headache(784.0)    HEMATOCHEZIA 04/17/2009   Qualifier: Diagnosis of  By: Kelly Plan MD Kelly Rollins    Hyperlipidemia    Hypertension    IBS (irritable bowel syndrome)    Migraine headache 11/06/2008   Qualifier: Diagnosis of  By: Kelly Medina MD, Kelly Rollins     OVARIAN CYST, RIGHT 02/21/2009   Qualifier: Diagnosis of  By: Kelly Medina MD, Kelly Rollins     PALPITATIONS, OCCASIONAL 04/17/2010   Qualifier: Diagnosis of  By: Kelly Medina MD, Kelly Rollins     Pancreatitis 10/14/2017   Past Surgical History:  Procedure Laterality Date   BREAST EXCISIONAL BIOPSY Left 1997   ? left side, done at time of reduction , benign   BREAST REDUCTION SURGERY     bilateral   CESAREAN SECTION     CHOLECYSTECTOMY     COLONOSCOPY     ESOPHAGOGASTRODUODENOSCOPY     ESOPHAGOGASTRODUODENOSCOPY (EGD) WITH PROPOFOL N/A 10/21/2020   Procedure: ESOPHAGOGASTRODUODENOSCOPY (EGD) WITH PROPOFOL;  Surgeon: Kelly Rubenstein,  MD;  Location: [facility] ENDOSCOPY;  Service: Endoscopy;  Laterality: N/A;   REDUCTION MAMMAPLASTY Bilateral 1997   VAGINAL HYSTERECTOMY     Patient Active Problem List   Diagnosis Date Noted   Abnormal MRI, lumbar spine (01/29/2020) 02/19/2020   Anxiety due to MRI 01/25/2020   Claustrophobia associated to MRI 01/25/2020   Enthesopathy of knee region (Right) 01/25/2020   Enthesopathy of knee region (Left) 01/25/2020   Lumbar facet hypertrophy 01/25/2020   Lumbosacral radiculopathy at S1 (Left) 01/17/2020   Lumbosacral radiculopathy (S1) (Left) 01/17/2020   Chronic lower extremity pain (Bilateral) (L>R) 01/17/2020   History of pancreatitis 07/18/2019   Obstructive sleep apnea syndrome 01/02/2019   Lumbar facet arthropathy (Multilevel) (Bilateral) 12/14/2018   Osteoarthritis involving multiple joints 11/28/2018   Chronic hip pain (Left) 11/16/2018   Acute hip pain (Left) 11/16/2018   DDD (degenerative disc disease), lumbosacral 11/16/2018   Chronic hip pain (Secondary source of pain) (Bilateral) (L>R) 05/19/2017   Elevated C-reactive protein (CRP) 05/19/2017   Elevated sed rate 05/19/2017   Spondylosis without myelopathy or radiculopathy, lumbosacral region 05/19/2017   Pharmacologic therapy 05/06/2017   Disorder of skeletal system 05/06/2017   Problems influencing health status 05/06/2017   Chest pain with moderate risk  of acute coronary syndrome 04/21/2017   DOE (dyspnea on exertion) 04/21/2017   Intermittent palpitations 04/21/2017   Vitamin D insufficiency 05/04/2016   Depression 02/04/2016   Fatty liver 02/04/2016   Hypertension associated with diabetes ([place]) 02/04/2016   Chronic pain syndrome 01/15/2016   Acute low back pain 05/28/2015   Lumbar transverse process fracture (HCC) (Left L1) (03/11/15) 05/28/2015   Long term current use of opiate analgesic 05/01/2015   Long term prescription opiate use 05/01/2015   Opiate use (25 MME/Day) 05/01/2015   Encounter for therapeutic  drug level monitoring 05/01/2015   Encounter for pain management planning 05/01/2015   Chronic low back pain (Primary Source of Pain) (Bilateral) (L>R) 05/01/2015   Lumbar facet syndrome (Bilateral) (L>R) 05/01/2015   Chronic knee pain [facility] source of pain) (Bilateral) (L>R) 05/01/2015   Lumbar spondylosis 05/01/2015   Neuropathic pain 05/01/2015   Neurogenic pain 05/01/2015   Myofascial pain 05/01/2015   Essential hypertriglyceridemia 04/26/2014   Type 2 diabetes mellitus with hyperglycemia, with long-term current use of insulin ([place]) 04/26/2014   Anxiety 05/15/2010   Obesity 05/15/2010   Hyperlipidemia 04/03/2010   Fatty liver disease 04/02/2010   Pure hypercholesterolemia 03/19/2010   GERD 04/17/2009   Constipation 04/17/2009   TOBACCO ABUSE 11/06/2008    ONSET DATE: January 2023  REFERRING DIAG: C71.9 (ICD-10-CM) - Malignant neoplasm of brain, unspecified  THERAPY DIAG:  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HCC)  Muscle weakness (generalized)  Difficulty in walking, not elsewhere classified  Other lack of coordination  Unsteadiness on feet  Rationale for Evaluation and Treatment Rehabilitation  SUBJECTIVE:                                                                                                                                                                                              SUBJECTIVE STATEMENT: Pt rates current pain level as 4/10. Pt and her spouse report she fell this morning coming out of the bathroom. Pt spouse reports pt primarily fell against wall on L shoulder and that she bumped her head a little stating "it didn'Rollins leave a knot." She reports no head pain following fall and no other symptoms. Pt reports she feels just as fatigued/exhausted as she was previous session.  Pt accompanied by:  Kelly Rollins  PAIN:  Are you having pain? 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 Pt's husband, Kelly Rollins, present with pt. Kelly Rollins provides majority of hx as pt has difficulty with speech due  to Broca's aphasia (working with SLP). Pt reports partial paralysis of R side. She is unable to move her RUE, RLE also affected. Pt ambulates with hemiwalker in their home for short distances and does not use WC at home. She fatigues quickly with ambulation. Pt has AFO for RLE but unable to determine how to donn it without it hurting her LE. Pt currently with wound on R calf covered by large bandage that they have been treating for at least a month (reports physician aware). In addition to wound on R calf, pt reports R side skin irritation under breast and lateral trunk due to bra underwire. They have since removed underwire. Pt was hospitalized 10 days in June due to UTI. She has had at least 12 falls in the last six months. Reports no injuries with falls, and states "just bumps and bruises." Pt reports she has constant head pain. Pt spouse reports pt has pain throughout her whole right side down to LE. Can have bad cramps in RUE and RLE. Reports no numbness/tingling. Occ dizziness, unable to describe further. Pt sleeps in recliner, wakes up stiff in the morning. PMH per chart includes: Anorexia, dehydration, Adult Failure to thrive, contracture of muscles multiple sites. Pt./caregiver reports recently being hospitalized with a UTI, and dehydration. Extensive hx, please refer to chart for full details.   PAIN:  Are you having pain? Yes: NPRS scale: 3/10 Pain location: Top of head, front Pain description: States, "cancer pain" head pain Aggravating factors: unsure Relieving factors: medications  PRECAUTIONS: Fall  WEIGHT BEARING RESTRICTIONS No  FALLS: Has patient fallen in last 6 months? Yes. Number of falls 12  LIVING ENVIRONMENT: information primarily from chart Lives with: lives with their spouse Lives  in: House/apartment Stairs: Yes: External: 4 steps; bilateral but cannot reach both Has following equipment at home: Single point cane, Walker - 4 wheeled, Hemi walker, shower chair, bed side commode, Grab bars, and lift chair  PLOF: Independent  PATIENT GOALS  "Normal function" as much as possible. Improving her balance.  OBJECTIVE:   DIAGNOSTIC FINDINGS:   MR BRAIN W WO CONTRAST 02/06/2021:  "IMPRESSION: Masslike T2 signal in the anterior left frontal lobe, without associated enhancement, most concerning for a low-grade glioma. Additional areas of increased T2 signal in the left frontal lobe may also infiltrative low-grade glioma or associated edema."  CT HEAD WO CONTRAST (5MM) 02/06/2021: "IMPRESSION: High left anterior frontal edema with new adjacent masslike hyperdensity involving the left frontal white matter, high left left frontal cortex, and genu of corpus callosum. Overall, findings are suspicious for malignancy (including hypercellular tumors such as GBM and lymphoma). Acute or subacute left ACA territory infarct is a differential consideration, but thought less likely. Recommend MRI with contrast for further evaluation."   COGNITION: Overall cognitive status: Within functional limits for tasks assessed   SENSATION: Impaired in RLE - diminished and absent throughout R ankle/foot - noted wound on calf - LLE intact to light touch   COORDINATION:  Finger to chin with LUE - few errors otherwise able to complete. Unable to perform RUE.  EDEMA: noted B ankles   MUSCLE TONE: RLE: Mild-moderate. Pt unable to get R heel on floor in weightbearing position, plantarflexed  LOWER EXTREMITY MMT:     LLE grossly 4/5 throughout Trace activation RLE in chair with majority of muscles tested. RLE generally <3/5. Exception R hip abductors 3-/5, would benefit from further assessment in supine.    TRANSFERS: Assistive device utilized: Hemi walker  Sit to  stand:  Attempted 5xSTS  in session, unable to complete but did perform 2x in session with mod a.  Spouse reports pt able to perform transfers with 20% help at home from chair of increased height Stand to sit: Mod Ax2   GAIT: Gait pattern: step to pattern, RLE fixed in PF, externally rotated, uses L hemiwalker Distance walked: 20 ft Assistive device utilized: Hemi walker Level of assistance:  close CGA Comments: heavy LUE weightbearing on HW  FUNCTIONAL TESTs:  5 times sit to stand: unable to complete from standard chair height, performs 1 STS  PATIENT SURVEYS:  FOTO unable to complete due to error indicating FOTO not available in system  TODAY'S TREATMENT:   At support surface:  STS (pt pulling to stand) 10 reps. PT completed 10 LTL weight-shifts in standing for 3 sets as well. CGA-mod assist.  In // bars: Amb with WC follow and UUE support cga-min in // bars fwd/bckwd stepping 1x8 length of bars and 1x2 length of bars. PT provides CGA-min a throughout.  Seated: LLE lateral step-out for hip abd/ER  with and without obstacle (half foam) for addition of foot clerance - 3 sets of 10. Challenging  PROM seated hip abd/er RLE 10x.   LAQ LLE 15x. Challenging. TC to promote increased quad activation.  PT provides RLE hamstring and gastrocsol stetch x 60 sec  Pt fatigued by end of session but able to perform more challenging therex    PATIENT EDUCATION: Education details: Pt educated throughout session about proper posture and technique with exercises. Improved exercise technique, movement at target joints, use of target muscles after min to mod verbal, visual, tactile cues.  Person educated: pt and her spouse Education method:Explanation, Demonstration, Tactile cues, and Verbal cues Education comprehension: verbalized understanding, returned demonstration, tactile cues required, and needs further education   HOME EXERCISE PROGRAM: No updates on this date, pt to continue HEP as previously given Access  Code: [email or web address] URL: [email or web address] Date: 10/02/2021 Prepared by: Sande Brothers  Exercises - Seated Hip Flexion March with Ankle Weights  - 1 x daily - 7 x weekly - 3 sets - 10 reps - Seated Long Arc Quad  - 1 x daily - 7 x weekly - 3 sets - 10 reps - Supine Quad Set  - 1 x daily - 7 x weekly - 3 sets - 10 reps - Proper Sit to Stand Technique  - 1 x daily - 7 x weekly - 3 sets - 10 reps  GOALS: Goals reviewed with patient? Yes  SHORT TERM GOALS: Target date: 10/28/2021  Patient will be independent in home exercise program to improve strength/mobility for better functional independence with ADLs. Baseline: to be initiated next 1-2 sessions Goal status: INITIAL   LONG TERM GOALS: Target date: 12/09/2021  Patient will increase FOTO score to equal to or greater than     to demonstrate statistically significant improvement in mobility and quality of life.  Baseline: to be initiated next 1-2 visits Goal status: INITIAL  2.   Patient (< 23 years old) will be able to perform and complete five times sit to stand test from standard chair height in <30 seconds to indicate increased LE strength and improved balance. Baseline: Pt able to complete only 1 stand, insufficient strength currently to complete full test Goal status: INITIAL  3.  Pt will improve LLE strength by at least 1/2 point on MMT in order to increase ease with transfers and functional mobility. Baseline: LLE grossly 4/5 throughout Goal  status: INITIAL  4.  Patient will increase 10 meter walk test to at least 0.80 m/s as to improve gait speed for better community ambulation and to reduce fall risk. Baseline: to be tested next 1-2 visits Goal status: INITIAL  5.  Pt will complete STS from standard height chair with BUE assist off armrests and with no greater than min assist to improve ease and safety with transfers. Baseline: mod a x2 Goal status: INITIAL   ASSESSMENT:  CLINICAL IMPRESSION: The  pt continues to present with significant fatigue at start of session. However, pt highly motivated to participate and was able to perform more challenging interventions today such as multiple STS and ambulation intervention in // bars. She required up to mod assist today with standing therex. The pt would benefit from continued skilled PT to address immobility, LE muscle weakness, and pain in order to decrease fall risk and increase functional mobility and QOL.   OBJECTIVE IMPAIRMENTS Abnormal gait, decreased activity tolerance, decreased balance, decreased coordination, decreased endurance, decreased mobility, difficulty walking, decreased ROM, decreased strength, hypomobility, increased edema, increased fascial restrictions, increased muscle spasms, impaired flexibility, impaired sensation, impaired tone, impaired UE functional use, improper body mechanics, postural dysfunction, and pain.   ACTIVITY LIMITATIONS carrying, lifting, bending, sitting, standing, squatting, stairs, transfers, bed mobility, bathing, toileting, dressing, reach over head, hygiene/grooming, locomotion level, and caring for others  PARTICIPATION LIMITATIONS: meal prep, cleaning, laundry, medication management, personal finances, driving, shopping, community activity, occupation, and yard work  PERSONAL Education officer, community, Past/current experiences, Sex, and 3+ comorbidities: Anorexia, dehydration, Adult Failure to thrive, contracture of muscles multiple sites, multiple falls, UTI, extensive PMH refer to chart  are also affecting patient's functional outcome.   REHAB POTENTIAL: Fair    CLINICAL DECISION MAKING: Evolving/moderate complexity  EVALUATION COMPLEXITY: High  Rollins: PT FREQUENCY: 2x/week  PT DURATION: 12 weeks  PLANNED INTERVENTIONS: Therapeutic exercises, Therapeutic activity, Neuromuscular re-education, Balance training, Gait training, Patient/Family education, Self Care, Joint mobilization, Joint manipulation,  Stair training, Vestibular training, Canalith repositioning, Orthotic/Fit training, DME instructions, Wheelchair mobility training, Spinal mobilization, Cryotherapy, Moist heat, scar mobilization, Splintting, Taping, Manual therapy, and Re-evaluation  Rollins FOR NEXT SESSION:  stretching, strengthening, gait, balance, continue Rollins   Zollie Pee, PT 10/22/2021, 10:34 AM

## 2021-10-23 ENCOUNTER — Ambulatory Visit: Payer: Medicare Other

## 2021-10-23 ENCOUNTER — Encounter: Payer: Medicare Other | Admitting: Speech Pathology

## 2021-10-23 ENCOUNTER — Ambulatory Visit: Payer: Medicare Other | Admitting: Occupational Therapy

## 2021-10-24 NOTE — Therapy (Signed)
OUTPATIENT SPEECH LANGUAGE PATHOLOGY TREATMENT NOTE   Patient Name: Kelly Rollins MRN: [id number] DOB:[birth year], 51 y.o., female Today's Date: 10/24/2021  PCP: Grayland Ormond, PA REFERRING PROVIDER: Margarita Grizzle, NP  END OF SESSION:   End of Session - 10/24/21 0741     Visit Number 8    Number of Visits 25    Date for SLP Re-Evaluation 11/13/21    Authorization Type [employer]    Progress Note Due on Visit 10    SLP Start Time 1500    SLP Stop Time  1600    SLP Time Calculation (min) 60 min    Activity Tolerance Patient tolerated treatment well             Past Medical History:  Diagnosis Date   ABDOMINAL PAIN OTHER SPECIFIED SITE 02/21/2009   Qualifier: Diagnosis of  By: Deborra Medina MD, Talia     ABDOMINAL PAIN-LUQ 04/17/2009   Qualifier: Diagnosis of  By: Fuller Plan MD Lamont Snowball T    Anal fissure    Anginal pain ([place])    Asthma    Depression    Diabetes mellitus without complication ([place])    Dyspnea    Dysrhythmia    Headache(784.0)    HEMATOCHEZIA 04/17/2009   Qualifier: Diagnosis of  By: Fuller Plan MD Lamont Snowball T    Hyperlipidemia    Hypertension    IBS (irritable bowel syndrome)    Migraine headache 11/06/2008   Qualifier: Diagnosis of  By: Deborra Medina MD, Talia     OVARIAN CYST, RIGHT 02/21/2009   Qualifier: Diagnosis of  By: Deborra Medina MD, Talia     PALPITATIONS, OCCASIONAL 04/17/2010   Qualifier: Diagnosis of  By: Deborra Medina MD, Talia     Pancreatitis 10/14/2017   Past Surgical History:  Procedure Laterality Date   BREAST EXCISIONAL BIOPSY Left 1997   ? left side, done at time of reduction , benign   BREAST REDUCTION SURGERY     bilateral   CESAREAN SECTION     CHOLECYSTECTOMY     COLONOSCOPY     ESOPHAGOGASTRODUODENOSCOPY     ESOPHAGOGASTRODUODENOSCOPY (EGD) WITH PROPOFOL N/A 10/21/2020   Procedure: ESOPHAGOGASTRODUODENOSCOPY (EGD) WITH PROPOFOL;  Surgeon: Lesly Rubenstein, MD;  Location: [facility] ENDOSCOPY;  Service: Endoscopy;  Laterality:  N/A;   REDUCTION MAMMAPLASTY Bilateral 1997   VAGINAL HYSTERECTOMY     Patient Active Problem List   Diagnosis Date Noted   Abnormal MRI, lumbar spine (01/29/2020) 02/19/2020   Anxiety due to MRI 01/25/2020   Claustrophobia associated to MRI 01/25/2020   Enthesopathy of knee region (Right) 01/25/2020   Enthesopathy of knee region (Left) 01/25/2020   Lumbar facet hypertrophy 01/25/2020   Lumbosacral radiculopathy at S1 (Left) 01/17/2020   Lumbosacral radiculopathy (S1) (Left) 01/17/2020   Chronic lower extremity pain (Bilateral) (L>R) 01/17/2020   History of pancreatitis 07/18/2019   Obstructive sleep apnea syndrome 01/02/2019   Lumbar facet arthropathy (Multilevel) (Bilateral) 12/14/2018   Osteoarthritis involving multiple joints 11/28/2018   Chronic hip pain (Left) 11/16/2018   Acute hip pain (Left) 11/16/2018   DDD (degenerative disc disease), lumbosacral 11/16/2018   Chronic hip pain (Secondary source of pain) (Bilateral) (L>R) 05/19/2017   Elevated C-reactive protein (CRP) 05/19/2017   Elevated sed rate 05/19/2017   Spondylosis without myelopathy or radiculopathy, lumbosacral region 05/19/2017   Pharmacologic therapy 05/06/2017   Disorder of skeletal system 05/06/2017   Problems influencing health status 05/06/2017   Chest pain with moderate risk of acute coronary syndrome 04/21/2017   DOE (  dyspnea on exertion) 04/21/2017   Intermittent palpitations 04/21/2017   Vitamin D insufficiency 05/04/2016   Depression 02/04/2016   Fatty liver 02/04/2016   Hypertension associated with diabetes ([place]) 02/04/2016   Chronic pain syndrome 01/15/2016   Acute low back pain 05/28/2015   Lumbar transverse process fracture (HCC) (Left L1) (03/11/15) 05/28/2015   Long term current use of opiate analgesic 05/01/2015   Long term prescription opiate use 05/01/2015   Opiate use (25 MME/Day) 05/01/2015   Encounter for therapeutic drug level monitoring 05/01/2015   Encounter for pain management  planning 05/01/2015   Chronic low back pain (Primary Source of Pain) (Bilateral) (L>R) 05/01/2015   Lumbar facet syndrome (Bilateral) (L>R) 05/01/2015   Chronic knee pain [facility] source of pain) (Bilateral) (L>R) 05/01/2015   Lumbar spondylosis 05/01/2015   Neuropathic pain 05/01/2015   Neurogenic pain 05/01/2015   Myofascial pain 05/01/2015   Essential hypertriglyceridemia 04/26/2014   Type 2 diabetes mellitus with hyperglycemia, with long-term current use of insulin ([place]) 04/26/2014   Anxiety 05/15/2010   Obesity 05/15/2010   Hyperlipidemia 04/03/2010   Fatty liver disease 04/02/2010   Pure hypercholesterolemia 03/19/2010   GERD 04/17/2009   Constipation 04/17/2009   TOBACCO ABUSE 11/06/2008    ONSET DATE: 02/14/2021 date of initial dx; 08/19/2021 date of referral      REFERRING DIAG: R47.01 (ICD-10-CM) - Aphasia   PERTINENT HISTORY: PMH T2DM and 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 Colin Mulders in 02/14/21 s/p radiation and TMZ.    DIAGNOSTIC FINDINGS: MRI 07/10/2021 Within limitations of motion artifact, no significant difference in left frontal based mass crossing midline through the corpus callosum. On conventional T1 postcontrast imaging, the previous areas of hyper enhancement appear to have diminished some degree. No definite areas of new involvement. Redemonstrated left frontal stereotactic biopsy tract.   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  Yes, monitored with repositioning  PATIENT GOALS: to be able to speak fluently  OBJECTIVE:   TODAY'S TREATMENT: Skilled treatment session focused on pt's expressive language goals. SLP facilitated session by  providing the following interventions:  SLP created pt account and downloaded [place] app on pt's personal iPad.   Specifically, [employer] app utilized to target: Sentence Scramble: Level 2 - more than a reasonable amount of time to complete Writing: Word ID: 90% Which word does not belong: maximal to moderate assistance to achieve 75% accuracy      PATIENT EDUCATION: Education details: tasks to facilitate word finding Person educated: Patient and Spouse Education method: Explanation, Demonstration, and Verbal cues Education comprehension: verbalized understanding and needs further education  GOALS: Goals reviewed with patient? Yes  SHORT TERM GOALS: Target date: 10 sessions   Given basic category, pt will generate at least 6 members in 7 out of 10 opportunities with minimal cues.  Baseline: 3 members in basic category Goal status: INITIAL   2.  Pt will demonstrate use of at least 1 word finding strategy to aid in communication breakdowns across 5 sessions with minimal cues.  Baseline: introduced concept 08/20/2021 Goal status: INITIAL   3.  Pt will expressively produce 4+ word sentence in response to situation/picture with 70% accuracy given minimal cues in order to increase ability  to communicate basic wants needs. Baseline: 3 word Goal status: INITIAL     LONG TERM GOALS: Target date: 11/12/2021   Pt and caregiver will utilize word finding strategies to communicate pt's basic wants/needs in 5 out of 10 opportunities.  Baseline: introduced 08/20/2021 Goal status: INITIAL    ASSESSMENT:  CLINICAL IMPRESSION: Pt continues to present with moderate word finding and thought completion impairments. Pt remains eager to maintain/progress her abilities while undergoing cancer treatments. As such, pt demonstrated good ability to understand HEP consisting of new app ([employer] Therapy) on her iPad.  OBJECTIVE IMPAIRMENTS include attention, expressive language,  and aphasia. These impairments are limiting patient from managing medications, managing appointments, managing finances, household responsibilities, ADLs/IADLs, and effectively communicating at home and in community. Factors affecting potential to achieve goals and functional outcome are  astrocytoma . Patient will benefit from skilled SLP services to address above impairments and improve overall function.  REHAB POTENTIAL: Fair ; astrocytomia  PLAN: SLP FREQUENCY: 1-2x/week  SLP DURATION: 12 weeks  PLANNED INTERVENTIONS: Language facilitation, Functional tasks, SLP instruction and feedback, and Patient/family education  Michon Kaczmarek B. Rutherford Nail, M.S., CCC-SLP, Mining engineer Certified Brain Injury [place]  [place] Office [phone] Ascom [phone] Fax [phone]

## 2021-10-27 NOTE — Therapy (Signed)
OUTPATIENT PHYSICAL THERAPY NEURO TREATMENT  Patient Name: Kelly Rollins MRN: [id number] DOB:03-12-1970, 50 y.o., female Today's Date: 10/28/2021   PCP: Kelly Rossetti, PA-C REFERRING PROVIDER: Elnora Rollins, Kelly Public, PA-C   PT End of Session - 10/28/21 1702     Visit Number 6    Number of Visits 25    Date for PT Re-Evaluation 12/09/21    PT Start Time 8413    PT Stop Time 1430    PT Time Calculation (min) 42 min    Equipment Utilized During Treatment Gait belt    Activity Tolerance Patient tolerated treatment well;No increased pain;Patient limited by fatigue    Behavior During Therapy Kelly Rollins for tasks assessed/performed                 Past Medical History:  Diagnosis Date   ABDOMINAL PAIN OTHER SPECIFIED SITE 02/21/2009   Qualifier: Diagnosis of  By: Kelly Medina MD, Kelly Rollins     ABDOMINAL PAIN-LUQ 04/17/2009   Qualifier: Diagnosis of  By: Kelly Plan MD Kelly Rollins    Anal fissure    Anginal pain ([place])    Asthma    Depression    Diabetes mellitus without complication ([place])    Dyspnea    Dysrhythmia    Headache(784.0)    HEMATOCHEZIA 04/17/2009   Qualifier: Diagnosis of  By: Kelly Plan MD Kelly Rollins    Hyperlipidemia    Hypertension    IBS (irritable bowel syndrome)    Migraine headache 11/06/2008   Qualifier: Diagnosis of  By: Kelly Medina MD, Kelly Rollins     OVARIAN CYST, RIGHT 02/21/2009   Qualifier: Diagnosis of  By: Kelly Medina MD, Kelly Rollins     PALPITATIONS, OCCASIONAL 04/17/2010   Qualifier: Diagnosis of  By: Kelly Medina MD, Kelly Rollins     Pancreatitis 10/14/2017   Past Surgical History:  Procedure Laterality Date   BREAST EXCISIONAL BIOPSY Left 1997   ? left side, done at time of reduction , benign   BREAST REDUCTION SURGERY     bilateral   CESAREAN SECTION     CHOLECYSTECTOMY     COLONOSCOPY     ESOPHAGOGASTRODUODENOSCOPY     ESOPHAGOGASTRODUODENOSCOPY (EGD) WITH PROPOFOL N/A 10/21/2020   Procedure: ESOPHAGOGASTRODUODENOSCOPY (EGD) WITH PROPOFOL;  Surgeon: Kelly Rubenstein,  MD;  Location: [facility] ENDOSCOPY;  Service: Endoscopy;  Laterality: N/A;   REDUCTION MAMMAPLASTY Bilateral 1997   VAGINAL HYSTERECTOMY     Patient Active Problem List   Diagnosis Date Noted   Abnormal MRI, lumbar spine (01/29/2020) 02/19/2020   Anxiety due to MRI 01/25/2020   Claustrophobia associated to MRI 01/25/2020   Enthesopathy of knee region (Right) 01/25/2020   Enthesopathy of knee region (Left) 01/25/2020   Lumbar facet hypertrophy 01/25/2020   Lumbosacral radiculopathy at S1 (Left) 01/17/2020   Lumbosacral radiculopathy (S1) (Left) 01/17/2020   Chronic lower extremity pain (Bilateral) (L>R) 01/17/2020   History of pancreatitis 07/18/2019   Obstructive sleep apnea syndrome 01/02/2019   Lumbar facet arthropathy (Multilevel) (Bilateral) 12/14/2018   Osteoarthritis involving multiple joints 11/28/2018   Chronic hip pain (Left) 11/16/2018   Acute hip pain (Left) 11/16/2018   DDD (degenerative disc disease), lumbosacral 11/16/2018   Chronic hip pain (Secondary source of pain) (Bilateral) (L>R) 05/19/2017   Elevated C-reactive protein (CRP) 05/19/2017   Elevated sed rate 05/19/2017   Spondylosis without myelopathy or radiculopathy, lumbosacral region 05/19/2017   Pharmacologic therapy 05/06/2017   Disorder of skeletal system 05/06/2017   Problems influencing health status 05/06/2017   Chest pain with moderate  risk of acute coronary syndrome 04/21/2017   DOE (dyspnea on exertion) 04/21/2017   Intermittent palpitations 04/21/2017   Vitamin D insufficiency 05/04/2016   Depression 02/04/2016   Fatty liver 02/04/2016   Hypertension associated with diabetes ([place]) 02/04/2016   Chronic pain syndrome 01/15/2016   Acute low back pain 05/28/2015   Lumbar transverse process fracture (HCC) (Left L1) (03/11/15) 05/28/2015   Long term current use of opiate analgesic 05/01/2015   Long term prescription opiate use 05/01/2015   Opiate use (25 MME/Day) 05/01/2015   Encounter for therapeutic  drug level monitoring 05/01/2015   Encounter for pain management planning 05/01/2015   Chronic low back pain (Primary Source of Pain) (Bilateral) (L>R) 05/01/2015   Lumbar facet syndrome (Bilateral) (L>R) 05/01/2015   Chronic knee pain [facility] source of pain) (Bilateral) (L>R) 05/01/2015   Lumbar spondylosis 05/01/2015   Neuropathic pain 05/01/2015   Neurogenic pain 05/01/2015   Myofascial pain 05/01/2015   Essential hypertriglyceridemia 04/26/2014   Type 2 diabetes mellitus with hyperglycemia, with long-term current use of insulin ([place]) 04/26/2014   Anxiety 05/15/2010   Obesity 05/15/2010   Hyperlipidemia 04/03/2010   Fatty liver disease 04/02/2010   Pure hypercholesterolemia 03/19/2010   GERD 04/17/2009   Constipation 04/17/2009   TOBACCO ABUSE 11/06/2008    ONSET DATE: January 2023  REFERRING DIAG: C71.9 (ICD-10-CM) - Malignant neoplasm of brain, unspecified  THERAPY DIAG:  Muscle weakness (generalized)  Unsteadiness on feet  Other lack of coordination  Difficulty in walking, not elsewhere classified  Rationale for Evaluation and Treatment Rehabilitation  SUBJECTIVE:                                                                                                                                                                                              SUBJECTIVE STATEMENT: Pt rates pain level as a 3/10. Pt reports no stumbles/Rollins. Pt spouse reports pt was a bit tired and sore after last appointment but was ok the next day.  Pt accompanied by:  Kelly Rollins  PAIN:  Are you having pain? 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 Pt's husband, Kelly Rollins, present with pt. Kelly Rollins provides majority of hx as pt has difficulty with speech due to Broca's aphasia (working with SLP). Pt reports partial paralysis  of R side. She is unable to move her RUE, RLE also affected. Pt ambulates with hemiwalker in their home for short distances and does not use WC at home. She fatigues quickly  with ambulation. Pt has AFO for RLE but unable to determine how to donn it without it hurting her LE. Pt currently with wound on R calf covered by large bandage that they have been treating for at least a month (reports physician aware). In addition to wound on R calf, pt reports R side skin irritation under breast and lateral trunk due to bra underwire. They have since removed underwire. Pt was hospitalized 10 days in June due to UTI. She has had at least 12 Rollins in the last six months. Reports no injuries with Rollins, and states "just bumps and bruises." Pt reports she has constant head pain. Pt spouse reports pt has pain throughout her whole right side down to LE. Can have bad cramps in RUE and RLE. Reports no numbness/tingling. Occ dizziness, unable to describe further. Pt sleeps in recliner, wakes up stiff in the morning. PMH per chart includes: Anorexia, dehydration, Adult Failure to thrive, contracture of muscles multiple sites. Pt./caregiver reports recently being hospitalized with a UTI, and dehydration. Extensive hx, please refer to chart for full details.   PAIN:  Are you having pain? Yes: NPRS scale: 3/10 Pain location: Top of head, front Pain description: States, "cancer pain" head pain Aggravating factors: unsure Relieving factors: medications  PRECAUTIONS: Fall  WEIGHT BEARING RESTRICTIONS No  Rollins: Has patient fallen in last 6 months? Yes. Number of Rollins 12  LIVING ENVIRONMENT: information primarily from chart Lives with: lives with their spouse Lives in: House/apartment Stairs: Yes: External: 4 steps; bilateral but cannot reach both Has following equipment at home: Single point cane, Walker - 4 wheeled, Hemi walker, shower chair, bed side commode, Grab bars, and lift chair  PLOF: Independent  PATIENT  GOALS  "Normal function" as much as possible. Improving her balance.  OBJECTIVE:   DIAGNOSTIC FINDINGS:   MR BRAIN W WO CONTRAST 02/06/2021:  "IMPRESSION: Masslike T2 signal in the anterior left frontal lobe, without associated enhancement, most concerning for a low-grade glioma. Additional areas of increased T2 signal in the left frontal lobe may also infiltrative low-grade glioma or associated edema."  CT HEAD WO CONTRAST (5MM) 02/06/2021: "IMPRESSION: High left anterior frontal edema with new adjacent masslike hyperdensity involving the left frontal white matter, high left left frontal cortex, and genu of corpus callosum. Overall, findings are suspicious for malignancy (including hypercellular tumors such as GBM and lymphoma). Acute or subacute left ACA territory infarct is a differential consideration, but thought less likely. Recommend MRI with contrast for further evaluation."   COGNITION: Overall cognitive status: Within functional limits for tasks assessed   SENSATION: Impaired in RLE - diminished and absent throughout R ankle/foot - noted wound on calf - LLE intact to light touch   COORDINATION:  Finger to chin with LUE - few errors otherwise able to complete. Unable to perform RUE.  EDEMA: noted B ankles   MUSCLE TONE: RLE: Mild-moderate. Pt unable to get R heel on floor in weightbearing position, plantarflexed  LOWER EXTREMITY MMT:     LLE grossly 4/5 throughout Trace activation RLE in chair with majority of muscles tested. RLE generally <3/5. Exception R hip abductors 3-/5, would benefit from further assessment in supine.    TRANSFERS: Assistive device utilized: Hemi walker  Sit to stand:  Attempted 5xSTS in session, unable to complete but did perform 2x in session with mod a.  Spouse reports pt able to perform transfers with 20% help at home from chair of increased height Stand to sit: Mod Ax2  GAIT: Gait pattern: step to pattern, RLE fixed in PF,  externally rotated, uses L hemiwalker Distance walked: 20 ft Assistive device utilized: Hemi walker Level of assistance:  close CGA Comments: heavy LUE weightbearing on HW  FUNCTIONAL TESTs:  5 times sit to stand: unable to complete from standard chair height, performs 1 STS  PATIENT SURVEYS:  FOTO unable to complete due to error indicating FOTO not available in system  TODAY'S TREATMENT:   TherEx-  Inspected wound R calf, no redness noted currently, yellow slough in wound bed, appears better than last time checked. Pt spouse reports they Rollins to change dressing later today, and pt does have appointment with wound specialist next month. PT also inspected R big toe as pt reports ingrown nail. A callus is present distal medial great toe near nail edge but no redness or swelling noted. Skin temp WNL. Area is somewhat TTP, PT recommends pt and spouse monitor area and should it worsen reach out to physician or wound specialist.   Seated LLE forwad and LTL toe tap onto airex 15x each way  Seated RLE hip flexion march with external cue (reach PT's hand) and cuing for large amplitude movement - x multiple reps. Improved activation with cuing within session.  In // bars: STS (pt pulling to stand) 3x5 CGA-mod assist - attempts with dual cog task - difficult.  Amb for endurance and activity tolerance with WC follow and UUE support cga-mod a in // bars fwd/bckwd stepping x multiple rounds where pt ambulated  length of bars   Rest breaks taken throughout  PATIENT EDUCATION: Education details: Pt educated throughout session about proper posture and technique with exercises. Improved exercise technique, movement at target joints, use of target muscles after min to mod verbal, visual, tactile cues.  Person educated: pt and her spouse Education method:Explanation, Demonstration, Tactile cues, and Verbal cues Education comprehension: verbalized understanding, returned demonstration, tactile cues  required, and needs further education   HOME EXERCISE PROGRAM: No updates on this date, pt to continue HEP as previously given Access Code: [email or web address] URL: [email or web address] Date: 10/02/2021 Prepared by: Sande Brothers  Exercises - Seated Hip Flexion March with Ankle Weights  - 1 x daily - 7 x weekly - 3 sets - 10 reps - Seated Long Arc Quad  - 1 x daily - 7 x weekly - 3 sets - 10 reps - Supine Quad Set  - 1 x daily - 7 x weekly - 3 sets - 10 reps - Proper Sit to Stand Technique  - 1 x daily - 7 x weekly - 3 sets - 10 reps  GOALS: Goals reviewed with patient? Yes  SHORT TERM GOALS: Target date: 10/28/2021  Patient will be independent in home exercise program to improve strength/mobility for better functional independence with ADLs. Baseline: to be initiated next 1-2 sessions Goal status: INITIAL   LONG TERM GOALS: Target date: 12/09/2021  Patient will increase FOTO score to equal to or greater than     to demonstrate statistically significant improvement in mobility and quality of life.  Baseline: to be initiated next 1-2 visits Goal status: INITIAL  2.   Patient (< 50 years old) will be able to perform and complete five times sit to stand test from standard chair height in <30 seconds to indicate increased LE strength and improved balance. Baseline: Pt able to complete only 1 stand, insufficient strength currently to complete full test Goal status: INITIAL  3.  Pt will improve LLE strength by at  least 1/2 point on MMT in order to increase ease with transfers and functional mobility. Baseline: LLE grossly 4/5 throughout Goal status: INITIAL  4.  Patient will increase 10 meter walk test to at least 0.80 m/s as to improve gait speed for better community ambulation and to reduce fall risk. Baseline: to be tested next 1-2 visits Goal status: INITIAL  5.  Pt will complete STS from standard height chair with BUE assist off armrests and with no greater than min  assist to improve ease and safety with transfers. Baseline: mod a x2 Goal status: INITIAL   ASSESSMENT:  CLINICAL IMPRESSION: Pt shows progress today with exhibiting significant increase in RLE hip flexion AROM. Pt able to further increase AROM with external TC and cue for large amplitude movement. This indicates increased LE strength and coordination. While pt shows progress, she still struggles with quick onset of fatigue. Plant to trial UE cardioresp endurance activity future session The pt would benefit from continued skilled PT to address immobility, LE muscle weakness, and pain in order to decrease fall risk and increase functional mobility and QOL.   OBJECTIVE IMPAIRMENTS Abnormal gait, decreased activity tolerance, decreased balance, decreased coordination, decreased endurance, decreased mobility, difficulty walking, decreased ROM, decreased strength, hypomobility, increased edema, increased fascial restrictions, increased muscle spasms, impaired flexibility, impaired sensation, impaired tone, impaired UE functional use, improper body mechanics, postural dysfunction, and pain.   ACTIVITY LIMITATIONS carrying, lifting, bending, sitting, standing, squatting, stairs, transfers, bed mobility, bathing, toileting, dressing, reach over head, hygiene/grooming, locomotion level, and caring for others  PARTICIPATION LIMITATIONS: meal prep, cleaning, laundry, medication management, personal finances, driving, shopping, community activity, occupation, and yard work  PERSONAL Education officer, community, Past/current experiences, Sex, and 3+ comorbidities: Anorexia, dehydration, Adult Failure to thrive, contracture of muscles multiple sites, multiple Rollins, UTI, extensive PMH refer to chart  are also affecting patient's functional outcome.   REHAB POTENTIAL: Fair    CLINICAL DECISION MAKING: Evolving/moderate complexity  EVALUATION COMPLEXITY: High  Rollins: PT FREQUENCY: 2x/week  PT DURATION: 12  weeks  PLANNED INTERVENTIONS: Therapeutic exercises, Therapeutic activity, Neuromuscular re-education, Balance training, Gait training, Patient/Family education, Self Care, Joint mobilization, Joint manipulation, Stair training, Vestibular training, Canalith repositioning, Orthotic/Fit training, DME instructions, Wheelchair mobility training, Spinal mobilization, Cryotherapy, Moist heat, scar mobilization, Splintting, Taping, Manual therapy, and Re-evaluation  Rollins FOR NEXT SESSION:  stretching, strengthening, gait, balance, continue Rollins   Zollie Pee, PT 10/28/2021, 5:13 PM

## 2021-10-28 ENCOUNTER — Encounter: Payer: Self-pay | Admitting: Occupational Therapy

## 2021-10-28 ENCOUNTER — Ambulatory Visit: Payer: Medicare Other | Admitting: Speech Pathology

## 2021-10-28 ENCOUNTER — Ambulatory Visit: Payer: Medicare Other

## 2021-10-28 ENCOUNTER — Ambulatory Visit: Payer: Medicare Other | Admitting: Occupational Therapy

## 2021-10-28 DIAGNOSIS — R262 Difficulty in walking, not elsewhere classified: Secondary | ICD-10-CM

## 2021-10-28 DIAGNOSIS — R41841 Cognitive communication deficit: Secondary | ICD-10-CM

## 2021-10-28 DIAGNOSIS — M6281 Muscle weakness (generalized): Secondary | ICD-10-CM

## 2021-10-28 DIAGNOSIS — R2681 Unsteadiness on feet: Secondary | ICD-10-CM

## 2021-10-28 DIAGNOSIS — R278 Other lack of coordination: Secondary | ICD-10-CM

## 2021-10-28 DIAGNOSIS — C719 Malignant neoplasm of brain, unspecified: Secondary | ICD-10-CM

## 2021-10-28 NOTE — Therapy (Addendum)
Patient Name: Kelly Rollins MRN: [id number] DOB:27-Mar-1970, 51 y.o., female Today's Date: 10/28/2021  Pt. Arrived for all therapies this afternoon. Pt. became diaphoretic with nausea during ST, following PT. Pt. Started feeling better, and reported that she wanted to try to do OT today. Once arriving at OT, Pt. had another wave of nausea. The remaining of the OT session was cancelled for the afternoon.  Harrel Carina, MS, OTR/L   Harrel Carina, OT 10/28/2021, 4:17 PM

## 2021-10-28 NOTE — Therapy (Signed)
OUTPATIENT SPEECH LANGUAGE PATHOLOGY TREATMENT NOTE   Patient Name: Kelly Rollins MRN: [id number] DOB:[birth year], 51 y.o., female Today's Date: 10/28/2021  PCP: Grayland Ormond, PA REFERRING PROVIDER: Margarita Grizzle, NP  END OF SESSION:   End of Session - 10/28/21 2126     Visit Number 9    Number of Visits 25    Date for SLP Re-Evaluation 11/13/21    Authorization Type [employer]    Progress Note Due on Visit 10    SLP Start Time 1500    SLP Stop Time  1600    SLP Time Calculation (min) 60 min    Activity Tolerance Other (comment)   treatment limited d/t severe dizziness and nausea            Past Medical History:  Diagnosis Date   ABDOMINAL PAIN OTHER SPECIFIED SITE 02/21/2009   Qualifier: Diagnosis of  By: Deborra Medina MD, Talia     ABDOMINAL PAIN-LUQ 04/17/2009   Qualifier: Diagnosis of  By: Fuller Plan MD Lamont Snowball T    Anal fissure    Anginal pain ([place])    Asthma    Depression    Diabetes mellitus without complication ([place])    Dyspnea    Dysrhythmia    Headache(784.0)    HEMATOCHEZIA 04/17/2009   Qualifier: Diagnosis of  By: Fuller Plan MD Lamont Snowball T    Hyperlipidemia    Hypertension    IBS (irritable bowel syndrome)    Migraine headache 11/06/2008   Qualifier: Diagnosis of  By: Deborra Medina MD, Talia     OVARIAN CYST, RIGHT 02/21/2009   Qualifier: Diagnosis of  By: Deborra Medina MD, Talia     PALPITATIONS, OCCASIONAL 04/17/2010   Qualifier: Diagnosis of  By: Deborra Medina MD, Talia     Pancreatitis 10/14/2017   Past Surgical History:  Procedure Laterality Date   BREAST EXCISIONAL BIOPSY Left 1997   ? left side, done at time of reduction , benign   BREAST REDUCTION SURGERY     bilateral   CESAREAN SECTION     CHOLECYSTECTOMY     COLONOSCOPY     ESOPHAGOGASTRODUODENOSCOPY     ESOPHAGOGASTRODUODENOSCOPY (EGD) WITH PROPOFOL N/A 10/21/2020   Procedure: ESOPHAGOGASTRODUODENOSCOPY (EGD) WITH PROPOFOL;  Surgeon: Lesly Rubenstein, MD;  Location: [facility] ENDOSCOPY;   Service: Endoscopy;  Laterality: N/A;   REDUCTION MAMMAPLASTY Bilateral 1997   VAGINAL HYSTERECTOMY     Patient Active Problem List   Diagnosis Date Noted   Abnormal MRI, lumbar spine (01/29/2020) 02/19/2020   Anxiety due to MRI 01/25/2020   Claustrophobia associated to MRI 01/25/2020   Enthesopathy of knee region (Right) 01/25/2020   Enthesopathy of knee region (Left) 01/25/2020   Lumbar facet hypertrophy 01/25/2020   Lumbosacral radiculopathy at S1 (Left) 01/17/2020   Lumbosacral radiculopathy (S1) (Left) 01/17/2020   Chronic lower extremity pain (Bilateral) (L>R) 01/17/2020   History of pancreatitis 07/18/2019   Obstructive sleep apnea syndrome 01/02/2019   Lumbar facet arthropathy (Multilevel) (Bilateral) 12/14/2018   Osteoarthritis involving multiple joints 11/28/2018   Chronic hip pain (Left) 11/16/2018   Acute hip pain (Left) 11/16/2018   DDD (degenerative disc disease), lumbosacral 11/16/2018   Chronic hip pain (Secondary source of pain) (Bilateral) (L>R) 05/19/2017   Elevated C-reactive protein (CRP) 05/19/2017   Elevated sed rate 05/19/2017   Spondylosis without myelopathy or radiculopathy, lumbosacral region 05/19/2017   Pharmacologic therapy 05/06/2017   Disorder of skeletal system 05/06/2017   Problems influencing health status 05/06/2017   Chest pain with moderate risk of acute  coronary syndrome 04/21/2017   DOE (dyspnea on exertion) 04/21/2017   Intermittent palpitations 04/21/2017   Vitamin D insufficiency 05/04/2016   Depression 02/04/2016   Fatty liver 02/04/2016   Hypertension associated with diabetes ([place]) 02/04/2016   Chronic pain syndrome 01/15/2016   Acute low back pain 05/28/2015   Lumbar transverse process fracture (HCC) (Left L1) (03/11/15) 05/28/2015   Long term current use of opiate analgesic 05/01/2015   Long term prescription opiate use 05/01/2015   Opiate use (25 MME/Day) 05/01/2015   Encounter for therapeutic drug level monitoring 05/01/2015    Encounter for pain management planning 05/01/2015   Chronic low back pain (Primary Source of Pain) (Bilateral) (L>R) 05/01/2015   Lumbar facet syndrome (Bilateral) (L>R) 05/01/2015   Chronic knee pain [facility] source of pain) (Bilateral) (L>R) 05/01/2015   Lumbar spondylosis 05/01/2015   Neuropathic pain 05/01/2015   Neurogenic pain 05/01/2015   Myofascial pain 05/01/2015   Essential hypertriglyceridemia 04/26/2014   Type 2 diabetes mellitus with hyperglycemia, with long-term current use of insulin ([place]) 04/26/2014   Anxiety 05/15/2010   Obesity 05/15/2010   Hyperlipidemia 04/03/2010   Fatty liver disease 04/02/2010   Pure hypercholesterolemia 03/19/2010   GERD 04/17/2009   Constipation 04/17/2009   TOBACCO ABUSE 11/06/2008    ONSET DATE: 02/14/2021 date of initial dx; 08/19/2021 date of referral      REFERRING DIAG: R47.01 (ICD-10-CM) - Aphasia   PERTINENT HISTORY: PMH T2DM and 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 Colin Mulders in 02/14/21 s/p radiation and TMZ.    DIAGNOSTIC FINDINGS: MRI 07/10/2021 Within limitations of motion artifact, no significant difference in left frontal based mass crossing midline through the corpus callosum. On conventional T1 postcontrast imaging, the previous areas of hyper enhancement appear to have diminished some degree. No definite areas of new involvement. Redemonstrated left frontal stereotactic biopsy tract.   THERAPY DIAG:  Cognitive communication deficit  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[place])  Rationale for Evaluation and Treatment Rehabilitation  SUBJECTIVE: Pt with sudden dizziness and nausea  Pt accompanied by: husband  PAIN:  Are you having pain?  Yes, monitored with repositioning  PATIENT GOALS: to be able to speak fluently  OBJECTIVE:   TODAY'S TREATMENT: Skilled treatment session focused on pt's expressive language goals. SLP facilitated  session by providing the following interventions:  SLP downloaded 6 language and cognition apps onto pt's iPad.       PATIENT EDUCATION: Education details: tasks to facilitate word finding Person educated: Patient and Spouse Education method: Explanation, Demonstration, and Verbal cues Education comprehension: verbalized understanding and needs further education  GOALS: Goals reviewed with patient? Yes  SHORT TERM GOALS: Target date: 10 sessions   Given basic category, pt will generate at least 6 members in 7 out of 10 opportunities with minimal cues.  Baseline: 3 members in basic category Goal status: INITIAL   2.  Pt will demonstrate use of at least 1 word finding strategy to aid in communication breakdowns across 5 sessions with minimal cues.  Baseline: introduced concept 08/20/2021 Goal status: INITIAL   3.  Pt will expressively produce 4+ word sentence in response to situation/picture with 70% accuracy given minimal cues in order to increase ability to communicate basic wants needs. Baseline: 3 word Goal status: INITIAL     LONG TERM GOALS: Target date: 11/12/2021   Pt and caregiver will utilize word finding strategies to communicate pt's basic wants/needs in 5 out  of 10 opportunities.  Baseline: introduced 08/20/2021 Goal status: INITIAL    ASSESSMENT:  CLINICAL IMPRESSION: Pt's participation in skilled treatment was significantly limited d/t acute onset of dizziness and nausea.   Vitals taken and were: BP 140/90 Temp 97  Pt and her husband decided to go home after this session as pt had Zofran at home.    OBJECTIVE IMPAIRMENTS include attention, expressive language, and aphasia. These impairments are limiting patient from managing medications, managing appointments, managing finances, household responsibilities, ADLs/IADLs, and effectively communicating at home and in community. Factors affecting potential to achieve goals and functional outcome are   astrocytoma . Patient will benefit from skilled SLP services to address above impairments and improve overall function.  REHAB POTENTIAL: Fair ; astrocytomia  PLAN: SLP FREQUENCY: 1-2x/week  SLP DURATION: 12 weeks  PLANNED INTERVENTIONS: Language facilitation, Functional tasks, SLP instruction and feedback, and Patient/family education  Ryheem Jay B. Rutherford Nail, M.S., CCC-SLP, Mining engineer Certified Brain Injury [place]  [place] Office [phone] Ascom [phone] Fax [phone]

## 2021-10-30 ENCOUNTER — Ambulatory Visit: Payer: Medicare Other | Admitting: Occupational Therapy

## 2021-10-30 ENCOUNTER — Ambulatory Visit: Payer: Medicare Other | Admitting: Speech Pathology

## 2021-10-30 ENCOUNTER — Ambulatory Visit: Payer: Medicare Other

## 2021-10-30 DIAGNOSIS — C719 Malignant neoplasm of brain, unspecified: Secondary | ICD-10-CM

## 2021-10-30 DIAGNOSIS — R278 Other lack of coordination: Secondary | ICD-10-CM

## 2021-10-30 DIAGNOSIS — M6281 Muscle weakness (generalized): Secondary | ICD-10-CM

## 2021-10-30 DIAGNOSIS — R41841 Cognitive communication deficit: Secondary | ICD-10-CM

## 2021-10-30 DIAGNOSIS — R262 Difficulty in walking, not elsewhere classified: Secondary | ICD-10-CM

## 2021-10-30 DIAGNOSIS — R2681 Unsteadiness on feet: Secondary | ICD-10-CM

## 2021-10-30 NOTE — Therapy (Signed)
OUTPATIENT PHYSICAL THERAPY NEURO TREATMENT  Patient Name: Kelly Rollins MRN: [id number] DOB:05-10-1970, 51 y.o., female Today's Date: 10/30/2021   PCP: [name], PA-C REFERRING PROVIDER: [name], Bonnell Public, PA-C   PT End of Session - 10/30/21 1540     Visit Number 7    Number of Visits 25    Date for PT Re-Evaluation 12/09/21    PT Start Time 9150    PT Stop Time 1428    PT Time Calculation (min) 43 min    Equipment Utilized During Treatment Gait belt    Activity Tolerance Patient tolerated treatment well;No increased pain;Patient limited by fatigue    Behavior During Therapy [facility] for tasks assessed/performed                  Past Medical History:  Diagnosis Date   ABDOMINAL PAIN OTHER SPECIFIED SITE 02/21/2009   Qualifier: Diagnosis of  By: Deborra Medina MD, Talia     ABDOMINAL PAIN-LUQ 04/17/2009   Qualifier: Diagnosis of  By: Fuller Plan MD Lamont Snowball T    Anal fissure    Anginal pain ([place])    Asthma    Depression    Diabetes mellitus without complication ([place])    Dyspnea    Dysrhythmia    Headache(784.0)    HEMATOCHEZIA 04/17/2009   Qualifier: Diagnosis of  By: Fuller Plan MD Lamont Snowball T    Hyperlipidemia    Hypertension    IBS (irritable bowel syndrome)    Migraine headache 11/06/2008   Qualifier: Diagnosis of  By: Deborra Medina MD, Talia     OVARIAN CYST, RIGHT 02/21/2009   Qualifier: Diagnosis of  By: Deborra Medina MD, Talia     PALPITATIONS, OCCASIONAL 04/17/2010   Qualifier: Diagnosis of  By: Deborra Medina MD, Talia     Pancreatitis 10/14/2017   Past Surgical History:  Procedure Laterality Date   BREAST EXCISIONAL BIOPSY Left 1997   ? left side, done at time of reduction , benign   BREAST REDUCTION SURGERY     bilateral   CESAREAN SECTION     CHOLECYSTECTOMY     COLONOSCOPY     ESOPHAGOGASTRODUODENOSCOPY     ESOPHAGOGASTRODUODENOSCOPY (EGD) WITH PROPOFOL N/A 10/21/2020   Procedure: ESOPHAGOGASTRODUODENOSCOPY (EGD) WITH PROPOFOL;  Surgeon: Lesly Rubenstein, MD;  Location: [facility] ENDOSCOPY;  Service: Endoscopy;  Laterality: N/A;   REDUCTION MAMMAPLASTY Bilateral 1997   VAGINAL HYSTERECTOMY     Patient Active Problem List   Diagnosis Date Noted   Abnormal MRI, lumbar spine (01/29/2020) 02/19/2020   Anxiety due to MRI 01/25/2020   Claustrophobia associated to MRI 01/25/2020   Enthesopathy of knee region (Right) 01/25/2020   Enthesopathy of knee region (Left) 01/25/2020   Lumbar facet hypertrophy 01/25/2020   Lumbosacral radiculopathy at S1 (Left) 01/17/2020   Lumbosacral radiculopathy (S1) (Left) 01/17/2020   Chronic lower extremity pain (Bilateral) (L>R) 01/17/2020   History of pancreatitis 07/18/2019   Obstructive sleep apnea syndrome 01/02/2019   Lumbar facet arthropathy (Multilevel) (Bilateral) 12/14/2018   Osteoarthritis involving multiple joints 11/28/2018   Chronic hip pain (Left) 11/16/2018   Acute hip pain (Left) 11/16/2018   DDD (degenerative disc disease), lumbosacral 11/16/2018   Chronic hip pain (Secondary source of pain) (Bilateral) (L>R) 05/19/2017   Elevated C-reactive protein (CRP) 05/19/2017   Elevated sed rate 05/19/2017   Spondylosis without myelopathy or radiculopathy, lumbosacral region 05/19/2017   Pharmacologic therapy 05/06/2017   Disorder of skeletal system 05/06/2017   Problems influencing health status 05/06/2017   Chest pain with  moderate risk of acute coronary syndrome 04/21/2017   DOE (dyspnea on exertion) 04/21/2017   Intermittent palpitations 04/21/2017   Vitamin D insufficiency 05/04/2016   Depression 02/04/2016   Fatty liver 02/04/2016   Hypertension associated with diabetes ([place]) 02/04/2016   Chronic pain syndrome 01/15/2016   Acute low back pain 05/28/2015   Lumbar transverse process fracture (HCC) (Left L1) (03/11/15) 05/28/2015   Long term current use of opiate analgesic 05/01/2015   Long term prescription opiate use 05/01/2015   Opiate use (25 MME/Day) 05/01/2015   Encounter for therapeutic  drug level monitoring 05/01/2015   Encounter for pain management planning 05/01/2015   Chronic low back pain (Primary Source of Pain) (Bilateral) (L>R) 05/01/2015   Lumbar facet syndrome (Bilateral) (L>R) 05/01/2015   Chronic knee pain [facility] source of pain) (Bilateral) (L>R) 05/01/2015   Lumbar spondylosis 05/01/2015   Neuropathic pain 05/01/2015   Neurogenic pain 05/01/2015   Myofascial pain 05/01/2015   Essential hypertriglyceridemia 04/26/2014   Type 2 diabetes mellitus with hyperglycemia, with long-term current use of insulin ([place]) 04/26/2014   Anxiety 05/15/2010   Obesity 05/15/2010   Hyperlipidemia 04/03/2010   Fatty liver disease 04/02/2010   Pure hypercholesterolemia 03/19/2010   GERD 04/17/2009   Constipation 04/17/2009   TOBACCO ABUSE 11/06/2008    ONSET DATE: January 2023  REFERRING DIAG: C71.9 (ICD-10-CM) - Malignant neoplasm of brain, unspecified  THERAPY DIAG:  Muscle weakness (generalized)  Difficulty in walking, not elsewhere classified  Other lack of coordination  Unsteadiness on feet  Rationale for Evaluation and Treatment Rehabilitation  SUBJECTIVE:                                                                                                                                                                                              SUBJECTIVE STATEMENT: Pt reports nausea following last appointment. Unsure if she got too hot with what she was wearing during exercise or if it is what she ate following appointment. She is feeling better today. No stumbles/falls.   Pt accompanied by:  [name]  PAIN:  Are you having pain? 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 Pt's husband, [name], present with pt. [name] provides majority of hx as pt has difficulty with speech due to  Broca's aphasia (working with SLP). Pt reports partial paralysis of R side. She is unable to move her RUE, RLE also affected. Pt ambulates with hemiwalker in their home for short distances and does  not use WC at home. She fatigues quickly with ambulation. Pt has AFO for RLE but unable to determine how to donn it without it hurting her LE. Pt currently with wound on R calf covered by large bandage that they have been treating for at least a month (reports physician aware). In addition to wound on R calf, pt reports R side skin irritation under breast and lateral trunk due to bra underwire. They have since removed underwire. Pt was hospitalized 10 days in June due to UTI. She has had at least 12 falls in the last six months. Reports no injuries with falls, and states "just bumps and bruises." Pt reports she has constant head pain. Pt spouse reports pt has pain throughout her whole right side down to LE. Can have bad cramps in RUE and RLE. Reports no numbness/tingling. Occ dizziness, unable to describe further. Pt sleeps in recliner, wakes up stiff in the morning. PMH per chart includes: Anorexia, dehydration, Adult Failure to thrive, contracture of muscles multiple sites. Pt./caregiver reports recently being hospitalized with a UTI, and dehydration. Extensive hx, please refer to chart for full details.   PAIN:  Are you having pain? Yes: NPRS scale: 3/10 Pain location: Top of head, front Pain description: States, "cancer pain" head pain Aggravating factors: unsure Relieving factors: medications  PRECAUTIONS: Fall  WEIGHT BEARING RESTRICTIONS No  FALLS: Has patient fallen in last 6 months? Yes. Number of falls 12  LIVING ENVIRONMENT: information primarily from chart Lives with: lives with their spouse Lives in: House/apartment Stairs: Yes: External: 4 steps; bilateral but cannot reach both Has following equipment at home: Single point cane, Walker - 4 wheeled, Hemi walker, shower chair, bed side  commode, Grab bars, and lift chair  PLOF: Independent  PATIENT GOALS  "Normal function" as much as possible. Improving her balance.  OBJECTIVE:   DIAGNOSTIC FINDINGS:   MR BRAIN W WO CONTRAST 02/06/2021:  "IMPRESSION: Masslike T2 signal in the anterior left frontal lobe, without associated enhancement, most concerning for a low-grade glioma. Additional areas of increased T2 signal in the left frontal lobe may also infiltrative low-grade glioma or associated edema."  CT HEAD WO CONTRAST (5MM) 02/06/2021: "IMPRESSION: High left anterior frontal edema with new adjacent masslike hyperdensity involving the left frontal white matter, high left left frontal cortex, and genu of corpus callosum. Overall, findings are suspicious for malignancy (including hypercellular tumors such as GBM and lymphoma). Acute or subacute left ACA territory infarct is a differential consideration, but thought less likely. Recommend MRI with contrast for further evaluation."   COGNITION: Overall cognitive status: Within functional limits for tasks assessed   SENSATION: Impaired in RLE - diminished and absent throughout R ankle/foot - noted wound on calf - LLE intact to light touch   COORDINATION:  Finger to chin with LUE - few errors otherwise able to complete. Unable to perform RUE.  EDEMA: noted B ankles   MUSCLE TONE: RLE: Mild-moderate. Pt unable to get R heel on floor in weightbearing position, plantarflexed  LOWER EXTREMITY MMT:     LLE grossly 4/5 throughout Trace activation RLE in chair with majority of muscles tested. RLE generally <3/5. Exception R hip abductors 3-/5, would benefit from further assessment in supine.    TRANSFERS: Assistive device utilized: Hemi walker  Sit to stand:  Attempted 5xSTS in session, unable to complete but did perform 2x in session with mod a.  Spouse reports pt able to perform transfers with 20% help at home from chair of increased  height Stand to sit: Mod  Ax2   GAIT: Gait pattern: step to pattern, RLE fixed in PF, externally rotated, uses L hemiwalker Distance walked: 20 ft Assistive device utilized: Hemi walker Level of assistance:  close CGA Comments: heavy LUE weightbearing on HW  FUNCTIONAL TESTs:  5 times sit to stand: unable to complete from standard chair height, performs 1 STS  PATIENT SURVEYS:  FOTO unable to complete due to error indicating FOTO not available in system  TODAY'S TREATMENT:   In // bars with gait belt donned throughout TherEx-  STS 11x pull to stand -  up to mod assist provided  Large ampliutde cuing with external TC provided for the following: March RLE x multiple reps, approx 20x LLE LAQ RLE x multiple reps then perform LLE approx 2x10  Attempted B DF x multiple reps AROM and PROM, unclear if activation in R DFs  PT provides long duration R DF stretch - noted spasticity. Observed that pt has swelling in lateral R ankle just inferior to malleolus, no redness, skin temp WNL, no cuts/abrasions/wounds in this region. Will continue to monitor.  Soccer ball kicks with RLE. Demos within session improvement with RLE quad activation with intervention. However, pt has difficulty with timing of kicking/sequencing when PT kicks ball to pt.   PATIENT EDUCATION: Education details: Pt educated throughout session about proper posture and technique with exercises. Improved exercise technique, movement at target joints, use of target muscles after min to mod verbal, visual, tactile cues.  Person educated: pt and her spouse Education method:Explanation, Demonstration, Tactile cues, and Verbal cues Education comprehension: verbalized understanding, returned demonstration, tactile cues required, and needs further education   HOME EXERCISE PROGRAM: No updates on this date, pt to continue HEP as previously given Access Code: [email or web address] URL: [email or web address] Date: 10/02/2021 Prepared by: Sande Brothers  Exercises - Seated Hip Flexion March with Ankle Weights  - 1 x daily - 7 x weekly - 3 sets - 10 reps - Seated Long Arc Quad  - 1 x daily - 7 x weekly - 3 sets - 10 reps - Supine Quad Set  - 1 x daily - 7 x weekly - 3 sets - 10 reps - Proper Sit to Stand Technique  - 1 x daily - 7 x weekly - 3 sets - 10 reps  GOALS: Goals reviewed with patient? Yes  SHORT TERM GOALS: Target date: 10/28/2021  Patient will be independent in home exercise program to improve strength/mobility for better functional independence with ADLs. Baseline: to be initiated next 1-2 sessions Goal status: INITIAL   LONG TERM GOALS: Target date: 12/09/2021  Patient will increase FOTO score to equal to or greater than     to demonstrate statistically significant improvement in mobility and quality of life.  Baseline: to be initiated next 1-2 visits Goal status: INITIAL  2.   Patient (< 58 years old) will be able to perform and complete five times sit to stand test from standard chair height in <30 seconds to indicate increased LE strength and improved balance. Baseline: Pt able to complete only 1 stand, insufficient strength currently to complete full test Goal status: INITIAL  3.  Pt will improve LLE strength by at least 1/2 point on MMT in order to increase ease with transfers and functional mobility. Baseline: LLE grossly 4/5 throughout Goal status: INITIAL  4.  Patient will increase 10 meter walk test to at least 0.80 m/s as to improve gait speed for better community ambulation and  to reduce fall risk. Baseline: to be tested next 1-2 visits Goal status: INITIAL  5.  Pt will complete STS from standard height chair with BUE assist off armrests and with no greater than min assist to improve ease and safety with transfers. Baseline: mod a x2 Goal status: INITIAL   ASSESSMENT:  CLINICAL IMPRESSION: Interventions somewhat regressed today as pt reported significant nausea following last session. She  tolerated interventions well without pain or extreme fatigue. Pt showed progress with exhibiting both R hip flexor and R quad activation. She was able to perform multiple LAQ and soccer ball kicks with RLE. She had some difficulty with timing/sequencing, however.The pt would benefit from continued skilled PT to address immobility, LE muscle weakness, and pain in order to decrease fall risk and increase functional mobility and QOL.   OBJECTIVE IMPAIRMENTS Abnormal gait, decreased activity tolerance, decreased balance, decreased coordination, decreased endurance, decreased mobility, difficulty walking, decreased ROM, decreased strength, hypomobility, increased edema, increased fascial restrictions, increased muscle spasms, impaired flexibility, impaired sensation, impaired tone, impaired UE functional use, improper body mechanics, postural dysfunction, and pain.   ACTIVITY LIMITATIONS carrying, lifting, bending, sitting, standing, squatting, stairs, transfers, bed mobility, bathing, toileting, dressing, reach over head, hygiene/grooming, locomotion level, and caring for others  PARTICIPATION LIMITATIONS: meal prep, cleaning, laundry, medication management, personal finances, driving, shopping, community activity, occupation, and yard work  PERSONAL Education officer, community, Past/current experiences, Sex, and 3+ comorbidities: Anorexia, dehydration, Adult Failure to thrive, contracture of muscles multiple sites, multiple falls, UTI, extensive PMH refer to chart  are also affecting patient's functional outcome.   REHAB POTENTIAL: Fair    CLINICAL DECISION MAKING: Evolving/moderate complexity  EVALUATION COMPLEXITY: High  PLAN: PT FREQUENCY: 2x/week  PT DURATION: 12 weeks  PLANNED INTERVENTIONS: Therapeutic exercises, Therapeutic activity, Neuromuscular re-education, Balance training, Gait training, Patient/Family education, Self Care, Joint mobilization, Joint manipulation, Stair training, Vestibular  training, Canalith repositioning, Orthotic/Fit training, DME instructions, Wheelchair mobility training, Spinal mobilization, Cryotherapy, Moist heat, scar mobilization, Splintting, Taping, Manual therapy, and Re-evaluation  PLAN FOR NEXT SESSION:  stretching, strengthening, gait, balance, continue plan   Zollie Pee, PT 10/30/2021, 3:55 PM

## 2021-10-30 NOTE — Therapy (Signed)
OUTPATIENT SPEECH LANGUAGE PATHOLOGY TREATMENT NOTE DISCHARGE SUMMARY   Patient Name: SANDEEP DELAGARZA MRN: [id number] DOB:[birth year], 51 y.o., female Today's Date: 10/30/2021  PCP: Grayland Ormond, PA REFERRING PROVIDER: Margarita Grizzle, NP  END OF SESSION:   End of Session - 10/30/21 1518     Visit Number 10    Number of Visits 25    Date for SLP Re-Evaluation 11/13/21    Authorization Type [employer]    Progress Note Due on Visit 10    SLP Start Time 1430    SLP Stop Time  1515    SLP Time Calculation (min) 45 min    Activity Tolerance Patient tolerated treatment well             Past Medical History:  Diagnosis Date   ABDOMINAL PAIN OTHER SPECIFIED SITE 02/21/2009   Qualifier: Diagnosis of  By: Deborra Medina MD, Talia     ABDOMINAL PAIN-LUQ 04/17/2009   Qualifier: Diagnosis of  By: Fuller Plan MD Lamont Snowball T    Anal fissure    Anginal pain ([place])    Asthma    Depression    Diabetes mellitus without complication ([place])    Dyspnea    Dysrhythmia    Headache(784.0)    HEMATOCHEZIA 04/17/2009   Qualifier: Diagnosis of  By: Fuller Plan MD Lamont Snowball T    Hyperlipidemia    Hypertension    IBS (irritable bowel syndrome)    Migraine headache 11/06/2008   Qualifier: Diagnosis of  By: Deborra Medina MD, Talia     OVARIAN CYST, RIGHT 02/21/2009   Qualifier: Diagnosis of  By: Deborra Medina MD, Talia     PALPITATIONS, OCCASIONAL 04/17/2010   Qualifier: Diagnosis of  By: Deborra Medina MD, Talia     Pancreatitis 10/14/2017   Past Surgical History:  Procedure Laterality Date   BREAST EXCISIONAL BIOPSY Left 1997   ? left side, done at time of reduction , benign   BREAST REDUCTION SURGERY     bilateral   CESAREAN SECTION     CHOLECYSTECTOMY     COLONOSCOPY     ESOPHAGOGASTRODUODENOSCOPY     ESOPHAGOGASTRODUODENOSCOPY (EGD) WITH PROPOFOL N/A 10/21/2020   Procedure: ESOPHAGOGASTRODUODENOSCOPY (EGD) WITH PROPOFOL;  Surgeon: Lesly Rubenstein, MD;  Location: [facility] ENDOSCOPY;  Service:  Endoscopy;  Laterality: N/A;   REDUCTION MAMMAPLASTY Bilateral 1997   VAGINAL HYSTERECTOMY     Patient Active Problem List   Diagnosis Date Noted   Abnormal MRI, lumbar spine (01/29/2020) 02/19/2020   Anxiety due to MRI 01/25/2020   Claustrophobia associated to MRI 01/25/2020   Enthesopathy of knee region (Right) 01/25/2020   Enthesopathy of knee region (Left) 01/25/2020   Lumbar facet hypertrophy 01/25/2020   Lumbosacral radiculopathy at S1 (Left) 01/17/2020   Lumbosacral radiculopathy (S1) (Left) 01/17/2020   Chronic lower extremity pain (Bilateral) (L>R) 01/17/2020   History of pancreatitis 07/18/2019   Obstructive sleep apnea syndrome 01/02/2019   Lumbar facet arthropathy (Multilevel) (Bilateral) 12/14/2018   Osteoarthritis involving multiple joints 11/28/2018   Chronic hip pain (Left) 11/16/2018   Acute hip pain (Left) 11/16/2018   DDD (degenerative disc disease), lumbosacral 11/16/2018   Chronic hip pain (Secondary source of pain) (Bilateral) (L>R) 05/19/2017   Elevated C-reactive protein (CRP) 05/19/2017   Elevated sed rate 05/19/2017   Spondylosis without myelopathy or radiculopathy, lumbosacral region 05/19/2017   Pharmacologic therapy 05/06/2017   Disorder of skeletal system 05/06/2017   Problems influencing health status 05/06/2017   Chest pain with moderate risk of acute coronary syndrome 04/21/2017  DOE (dyspnea on exertion) 04/21/2017   Intermittent palpitations 04/21/2017   Vitamin D insufficiency 05/04/2016   Depression 02/04/2016   Fatty liver 02/04/2016   Hypertension associated with diabetes ([place]) 02/04/2016   Chronic pain syndrome 01/15/2016   Acute low back pain 05/28/2015   Lumbar transverse process fracture (HCC) (Left L1) (03/11/15) 05/28/2015   Long term current use of opiate analgesic 05/01/2015   Long term prescription opiate use 05/01/2015   Opiate use (25 MME/Day) 05/01/2015   Encounter for therapeutic drug level monitoring 05/01/2015    Encounter for pain management planning 05/01/2015   Chronic low back pain (Primary Source of Pain) (Bilateral) (L>R) 05/01/2015   Lumbar facet syndrome (Bilateral) (L>R) 05/01/2015   Chronic knee pain [facility] source of pain) (Bilateral) (L>R) 05/01/2015   Lumbar spondylosis 05/01/2015   Neuropathic pain 05/01/2015   Neurogenic pain 05/01/2015   Myofascial pain 05/01/2015   Essential hypertriglyceridemia 04/26/2014   Type 2 diabetes mellitus with hyperglycemia, with long-term current use of insulin ([place]) 04/26/2014   Anxiety 05/15/2010   Obesity 05/15/2010   Hyperlipidemia 04/03/2010   Fatty liver disease 04/02/2010   Pure hypercholesterolemia 03/19/2010   GERD 04/17/2009   Constipation 04/17/2009   TOBACCO ABUSE 11/06/2008    ONSET DATE: 02/14/2021 date of initial dx; 08/19/2021 date of referral      REFERRING DIAG: R47.01 (ICD-10-CM) - Aphasia   PERTINENT HISTORY: PMH T2DM and 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 Colin Mulders in 02/14/21 s/p radiation and TMZ.    DIAGNOSTIC FINDINGS: MRI 07/10/2021 Within limitations of motion artifact, no significant difference in left frontal based mass crossing midline through the corpus callosum. On conventional T1 postcontrast imaging, the previous areas of hyper enhancement appear to have diminished some degree. No definite areas of new involvement. Redemonstrated left frontal stereotactic biopsy tract.   THERAPY DIAG:  Cognitive communication deficit  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[place])  Rationale for Evaluation and Treatment Rehabilitation  SUBJECTIVE: "I feel much better today"  Pt accompanied by: husband  PAIN:  Are you having pain?  Yes, monitored with repositioning  PATIENT GOALS: to be able to speak fluently  OBJECTIVE:   TODAY'S TREATMENT: Skilled treatment session focused on pt's expressive language goals. SLP facilitated session by  providing the following interventions:  TalkPath Therapy app utilized: Sequencing words: level 1 - Moderate faded to minimal assistance to achieve 90% accuracy [place]izz[place]: Level 1 - moderate assistance for slow scanning to achieve 80% accuracy      PATIENT EDUCATION: Education details: tasks to facilitate word finding Person educated: Patient and Spouse Education method: Explanation, Demonstration, and Verbal cues Education comprehension: verbalized understanding and needs further education  GOALS: Goals reviewed with patient? Yes  SHORT TERM GOALS: Target date: 10 sessions   Given basic category, pt will generate at least 6 members in 7 out of 10 opportunities with minimal cues.  Baseline: 3 members in basic category Goal status: NOT MET - improved to 5   2.  Pt will demonstrate use of at least 1 word finding strategy to aid in communication breakdowns across 5 sessions with minimal cues.  Baseline: introduced concept 08/20/2021 Goal status: MET - gestures   3.  Pt will expressively produce 4+ word sentence in response to situation/picture with 70% accuracy given minimal cues in order to increase ability to communicate basic wants needs. Baseline: 3 word Goal status: Fluctuating ability     LONG TERM  GOALS: Target date: 11/12/2021   Pt and caregiver will utilize word finding strategies to communicate pt's basic wants/needs in 5 out of 10 opportunities.  Baseline: introduced 08/20/2021 Goal status: MET    ASSESSMENT:  CLINICAL IMPRESSION: Pt's basic communication has remained stable throughout ST sessions. While her language/communication abilities have occasionally fluctuated, the fluctuation is often based on fatigue and/or pain. At this time, SLP has provided pt with language building activities on her iPad and has instructed pt as well as her husband on ways to engage in cognitively stimulating tasks. Unfortunately, pt hasn't demonstrated progress with higher  level/abstract language. At this time, pt is no longer gaining functional ability in ST sessions.     Noell Shular B. Rutherford Nail, M.S., CCC-SLP, Mining engineer Certified Brain Injury [place]  [place] Office [phone] Ascom [phone] Fax [phone]

## 2021-10-30 NOTE — Therapy (Addendum)
OUTPATIENT OCCUPATIONAL THERAPY TREATMENT/RECERTIFICATION NOTE  Patient Name: Kelly Rollins MRN: [id number] DOB:09/18/1970, 51 y.o., female Today's Date: 10/30/2021  PCP: [name], PA-C REFERRING PROVIDER: [name], Corrine Jeani Hawking, PA-C   OT End of Session - 10/30/21 1549     Visit Number 17    Number of Visits 67    Date for OT Re-Evaluation 01/22/22    Authorization Time Period Progress reports period starting 09/08/2021    OT Start Time 1515    OT Stop Time 1600    OT Time Calculation (min) 45 min    Activity Tolerance Patient tolerated treatment well    Behavior During Therapy [facility] for tasks assessed/performed             Past Medical History:  Diagnosis Date   ABDOMINAL PAIN OTHER SPECIFIED SITE 02/21/2009   Qualifier: Diagnosis of  By: Deborra Medina MD, Talia     ABDOMINAL PAIN-LUQ 04/17/2009   Qualifier: Diagnosis of  By: Fuller Plan MD Lamont Snowball T    Anal fissure    Anginal pain ([place])    Asthma    Depression    Diabetes mellitus without complication ([place])    Dyspnea    Dysrhythmia    Headache(784.0)    HEMATOCHEZIA 04/17/2009   Qualifier: Diagnosis of  By: Fuller Plan MD Lamont Snowball T    Hyperlipidemia    Hypertension    IBS (irritable bowel syndrome)    Migraine headache 11/06/2008   Qualifier: Diagnosis of  By: Deborra Medina MD, Talia     OVARIAN CYST, RIGHT 02/21/2009   Qualifier: Diagnosis of  By: Deborra Medina MD, Talia     PALPITATIONS, OCCASIONAL 04/17/2010   Qualifier: Diagnosis of  By: Deborra Medina MD, Talia     Pancreatitis 10/14/2017   Past Surgical History:  Procedure Laterality Date   BREAST EXCISIONAL BIOPSY Left 1997   ? left side, done at time of reduction , benign   BREAST REDUCTION SURGERY     bilateral   CESAREAN SECTION     CHOLECYSTECTOMY     COLONOSCOPY     ESOPHAGOGASTRODUODENOSCOPY     ESOPHAGOGASTRODUODENOSCOPY (EGD) WITH PROPOFOL N/A 10/21/2020   Procedure: ESOPHAGOGASTRODUODENOSCOPY (EGD) WITH PROPOFOL;  Surgeon: Lesly Rubenstein, MD;   Location: [facility] ENDOSCOPY;  Service: Endoscopy;  Laterality: N/A;   REDUCTION MAMMAPLASTY Bilateral 1997   VAGINAL HYSTERECTOMY     Patient Active Problem List   Diagnosis Date Noted   Abnormal MRI, lumbar spine (01/29/2020) 02/19/2020   Anxiety due to MRI 01/25/2020   Claustrophobia associated to MRI 01/25/2020   Enthesopathy of knee region (Right) 01/25/2020   Enthesopathy of knee region (Left) 01/25/2020   Lumbar facet hypertrophy 01/25/2020   Lumbosacral radiculopathy at S1 (Left) 01/17/2020   Lumbosacral radiculopathy (S1) (Left) 01/17/2020   Chronic lower extremity pain (Bilateral) (L>R) 01/17/2020   History of pancreatitis 07/18/2019   Obstructive sleep apnea syndrome 01/02/2019   Lumbar facet arthropathy (Multilevel) (Bilateral) 12/14/2018   Osteoarthritis involving multiple joints 11/28/2018   Chronic hip pain (Left) 11/16/2018   Acute hip pain (Left) 11/16/2018   DDD (degenerative disc disease), lumbosacral 11/16/2018   Chronic hip pain (Secondary source of pain) (Bilateral) (L>R) 05/19/2017   Elevated C-reactive protein (CRP) 05/19/2017   Elevated sed rate 05/19/2017   Spondylosis without myelopathy or radiculopathy, lumbosacral region 05/19/2017   Pharmacologic therapy 05/06/2017   Disorder of skeletal system 05/06/2017   Problems influencing health status 05/06/2017   Chest pain with moderate risk of acute coronary syndrome 04/21/2017  DOE (dyspnea on exertion) 04/21/2017   Intermittent palpitations 04/21/2017   Vitamin D insufficiency 05/04/2016   Depression 02/04/2016   Fatty liver 02/04/2016   Hypertension associated with diabetes ([place]) 02/04/2016   Chronic pain syndrome 01/15/2016   Acute low back pain 05/28/2015   Lumbar transverse process fracture (HCC) (Left L1) (03/11/15) 05/28/2015   Long term current use of opiate analgesic 05/01/2015   Long term prescription opiate use 05/01/2015   Opiate use (25 MME/Day) 05/01/2015   Encounter for therapeutic drug  level monitoring 05/01/2015   Encounter for pain management planning 05/01/2015   Chronic low back pain (Primary Source of Pain) (Bilateral) (L>R) 05/01/2015   Lumbar facet syndrome (Bilateral) (L>R) 05/01/2015   Chronic knee pain [facility] source of pain) (Bilateral) (L>R) 05/01/2015   Lumbar spondylosis 05/01/2015   Neuropathic pain 05/01/2015   Neurogenic pain 05/01/2015   Myofascial pain 05/01/2015   Essential hypertriglyceridemia 04/26/2014   Type 2 diabetes mellitus with hyperglycemia, with long-term current use of insulin ([place]) 04/26/2014   Anxiety 05/15/2010   Obesity 05/15/2010   Hyperlipidemia 04/03/2010   Fatty liver disease 04/02/2010   Pure hypercholesterolemia 03/19/2010   GERD 04/17/2009   Constipation 04/17/2009   TOBACCO ABUSE 11/06/2008    ONSET DATE: 02/06/2021  REFERRING DIAG: Astrocytoma  THERAPY DIAG:  Muscle weakness (generalized)  Rationale for Evaluation and Treatment Rehabilitation  SUBJECTIVE:     SUBJECTIVE STATEMENT: Pt reports she has not been wearing her palm protector.   Pt accompanied by: significant other  PERTINENT HISTORY:  Pt. Is a 51 y.o. 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 PMHx includes: Anorexia, dehydration, Adult Failure to thrive, contracture of muscles multiple sites. Pt./caregiver reports recently being hospitalized with a UTI, and dehydration.   PRECAUTIONS: Fall  WEIGHT BEARING RESTRICTIONS No  PAIN:  Are you having pain?  Pt. reports 6/10 pain in the entire R side.   FALLS: Has patient fallen in last 6 months? Yes. Number of falls Multiple Falls  LIVING ENVIRONMENT: Lives with: Family Lives in: House/apartment Stairs: Yes: External: 4 steps; bilateral but cannot reach both Has following equipment at home: Single point cane, Walker - 4 wheeled, Hemi walker, shower chair, bed side commode, and Grab bars Lift chair in the  tub, [place],  PLOF: Independent  PATIENT GOALS: To improve the right UE  OBJECTIVE:   HAND DOMINANCE: Right  ADLs: Transfers/ambulation related to ADLs: Eating: MaxA with cutting food, setting up, Modified I with nondominant Left hand, [place]  Grooming: Independent  UB Dressing: MaxA  LB Dressing: MaxA Toileting: Uses a BSCommode mInA transfers, MaxA clothing negotiation Bathing: MaxA Tub Shower transfers:  Uses a shower lift chair Equipment:  w/c, BSCommode, cane   IADLs: Shopping: Husband performs Light housekeeping: Max Meal Prep: MaxA Community mobility: Relies on family Medication management:Husband assists Doctor, hospital: Performs together Handwriting: Not legible  MOBILITY STATUS: Hx of falls    ACTIVITY TOLERANCE: Activity tolerance: limited  FUNCTIONAL OUTCOME MEASURES: FOTO: 11.76  UPPER EXTREMITY ROM  : WFL Left AROM/PROM   Passive ROM Right eval Right  08/18/21 Right 09/08/2021 Right 10/30/2021  Shoulder flexion 30   48 15  Shoulder abduction 40 62 53 45  Shoulder adduction         Shoulder extension         Shoulder internal rotation         Shoulder external rotation  Elbow flexion 120   110 122  Elbow extension -67   -46 -72  Wrist flexion Neutral   Neutral neutral  Wrist extension 12   40 25  Wrist ulnar deviation         Wrist radial deviation         Wrist pronation         Wrist supination         (Blank rows = not tested)     UPPER EXTREMITY MMT:      MMT Right eval Left eval  Shoulder flexion 0/5 4+/5  Shoulder abduction 0/5 4+/5  Shoulder adduction      Shoulder extension      Shoulder internal rotation      Shoulder external rotation      Middle trapezius      Lower trapezius      Elbow flexion 0/5 4+/5  Elbow extension 0/5 4+/5  Wrist flexion      Wrist extension 0/5 4+/5  Wrist ulnar deviation      Wrist radial deviation      Wrist pronation      Wrist supination      (Blank rows = not tested)    FOTO score: 09/08/2021: 11   SENSATION: Light touch: WFL  EDEMA:   Wrist: 18.5 cm MCP: 21.5 cm  10/30/2021:  Wrist: 17.5 cm MCPs: 20cm    MUSCLE TONE: RUE: Severe  COGNITION: Overall cognitive status: Impaired  VISION: Subjective report: Wears glasses Baseline vision: Wears glasses all the time Pt. Reports no changes    PRAXIS: Impaired: Motor planning                TODAY'S TREATMENT:       PATIENT EDUCATION: Education details: Right UE ROM, positioning of the RUE Person educated: Patient and Spouse Education method: Explanation, Demonstration, Tactile cues, and Verbal cues Education comprehension: verbalized understanding, returned demonstration, and needs further education   HOME EXERCISE PROGRAM:   Pt./caregiver education about positioning of the RUE, ROM    GOALS: Goals reviewed with patient? Yes  SHORT TERM GOALS: Target date: 12/11/2021      Pt./caregivers will demonstrate independence with HEPs for UE ROM, and edema control Baseline: 10/30/2021: Pt. Edema has improved. Pt. Requires caregiver assist for positioning. 09/08/2021: Pt. Requires assist for positioning, ROM, edema control. Eval: No current HEPs  Goal status: Ongoing    LONG TERM GOALS: Target date: 01/22/2022    Pt. Will improve right hand edema by 1 cm at the wrist, and MCPs Baseline: 10/30/2021:  wrist: 17.5 cm, MCPs: 20 cm 09/08/2021: Wrist: 17.5 cm, MCPs: 20.5 cm Eval: Wrist: 18.5cm, MCPs: 21.5cm Goal status: Ongoing  2.  Pt. Will improve right shoulder PROM by 10 degrees with ADLs Baseline: 10/30/2021: shoulder flexion: 15, abduction: 45 09/08/2021: Right shoulder flexion: 48, abduction: 53 Eval: PROM shoulder flexion: 30 degrees, Abduction: 40 degrees Goal status: Ongoing  3.  Pt. Will improve right elbow PROM by 10 degrees to prepare for hand to mouth patterns Baseline: 10/30/2021:elbow extension -72, flexion: 122 09/08/2021: elbow extension: -46, flexion: 110 Eval:   elbow  extension -67, flexion 120 Goal status: Ongoing  4.  Pt. Will improve PROM right wrist extension by 10 degrees Baseline: 10/30/2021: wrist extension 25 09/08/2021: Wrist extension: 40 Eval: wrist extension 12 degrees, flexion to neutral Goal status: Ongoing  5.  Pt. Will tolerate full digit extension in the right hand to promote good skin integrity through the palmar surface of  the right hand  Baseline: 10/30/2021: Pt. Tolerates ROM through 50% of  digit extension range, occasionally 75% 09/08/2021: Pt. Is able to tolerate PROM through 50% of digit extension in the digits. Eval: Pt. Maintains digits in tight flexion Goal status: Ongoing  6.  Pt. Will require minA UE dressing using one armed dressing techniques. Baseline: 10/30/2021: Independent with button down shirt/sweater/cardigan, MaxA pullover shirt.09/08/2021: MaxA Eval: MaxA Goal status: Ongoing   ASSESSMENT:  CLINICAL IMPRESSION:  Measurements were obtained, and goals were reviewed with the Pt. Pt presents with 7/10 right shoulder pain, and decreased PROM today secondary to having a new painful episode when her daughter moved her arm in the shower this morning. Pt. Continues to present with limited RUE ROM  in the elbow, wrist, and digits secondary to pain. Pt. Has progressed with shoulder abduction, and wrist extension since the initial evaluation. Pt. Had made progress overall with RUE ROM during this past certification period, however, had a fall on her right UE, an injury this morning which has affected this progress. Pt. has improved with right wrist, and digit edema. Pt. Continues to benefit from OT services to work towards improving Pian, ROM, and to increase engagement of the RUE during ADLs, and IADLs.     PERFORMANCE DEFICITS in functional skills including ADLs, IADLs, coordination, proprioception, sensation, edema, tone, ROM, strength, pain, FMC, GMC, balance, decreased knowledge of use of DME, skin integrity, and UE functional  use, cognitive skills including attention, memory, and safety awareness, and psychosocial skills including coping strategies.   IMPAIRMENTS are limiting patient from ADLs, IADLs, education, work, leisure, and social participation.   COMORBIDITIES may have co-morbidities  that affects occupational performance. Patient will benefit from skilled OT to address above impairments and improve overall function.  MODIFICATION OR ASSISTANCE TO COMPLETE EVALUATION: Maximum modification of tasks or assist with assess necessary to complete an evaluation.  OT OCCUPATIONAL PROFILE AND HISTORY: Detailed assessment: Review of records and additional review of physical, cognitive, psychosocial history related to current functional performance.  CLINICAL DECISION MAKING: High - multiple treatment options, significant modification of task necessary  REHAB POTENTIAL: Good for stated goals  EVALUATION COMPLEXITY: High    PLAN: OT FREQUENCY: 2x/week  OT DURATION: 12 weeks  PLANNED INTERVENTIONS: self care/ADL training, therapeutic exercise, therapeutic activity, neuromuscular re-education, manual therapy, passive range of motion, paraffin, moist heat, contrast bath, patient/family education, cognitive remediation/compensation, energy conservation, and coping strategies training  RECOMMENDED OTHER SERVICES: PT, and ST  CONSULTED AND AGREED WITH PLAN OF CARE: Patient and family member/caregiver  PLAN FOR NEXT SESSION: Initiate PROM, and RUE Edema control techniques   Dessie Coma, M.S. OTR/L  10/30/21, 3:51 PM  ascom [phone]   Harrel Carina, OT 10/30/2021, 3:51 PM

## 2021-11-04 ENCOUNTER — Ambulatory Visit: Payer: Medicare Other

## 2021-11-06 ENCOUNTER — Ambulatory Visit: Payer: Medicare Other | Admitting: Occupational Therapy

## 2021-11-06 ENCOUNTER — Ambulatory Visit: Payer: Medicare Other | Attending: Adult Health Nurse Practitioner

## 2021-11-06 ENCOUNTER — Encounter: Payer: Medicare Other | Admitting: Speech Pathology

## 2021-11-06 ENCOUNTER — Encounter: Payer: Self-pay | Admitting: Occupational Therapy

## 2021-11-06 DIAGNOSIS — M6281 Muscle weakness (generalized): Secondary | ICD-10-CM | POA: Insufficient documentation

## 2021-11-06 DIAGNOSIS — R2689 Other abnormalities of gait and mobility: Secondary | ICD-10-CM | POA: Insufficient documentation

## 2021-11-06 DIAGNOSIS — C719 Malignant neoplasm of brain, unspecified: Secondary | ICD-10-CM | POA: Insufficient documentation

## 2021-11-06 DIAGNOSIS — R278 Other lack of coordination: Secondary | ICD-10-CM | POA: Insufficient documentation

## 2021-11-06 DIAGNOSIS — R2681 Unsteadiness on feet: Secondary | ICD-10-CM | POA: Insufficient documentation

## 2021-11-06 DIAGNOSIS — R262 Difficulty in walking, not elsewhere classified: Secondary | ICD-10-CM | POA: Insufficient documentation

## 2021-11-06 NOTE — Therapy (Signed)
[place] [facility] SERVICES [address] [place], [place], [address] Phone: [phone]   Fax:  [phone]  Patient Details  Name: Kelly Rollins MRN: [id number] Date of Birth: 1971-01-20 Referring Provider:  No ref. provider found  Encounter Date: 11/06/2021  Pt. Did not arrive for her therapy appointment this afternoon. A follow-up phone call was made, and a message was left via voicemail with our clinic's return phone number.   Harrel Carina, OT 11/06/2021, 5:32 PM  [place] [facility] SERVICES [address] [place], [place], [address] Phone: [phone]   Fax:  [phone]

## 2021-11-11 ENCOUNTER — Ambulatory Visit: Payer: Medicare Other

## 2021-11-11 ENCOUNTER — Ambulatory Visit: Payer: Medicare Other | Admitting: Speech Pathology

## 2021-11-11 DIAGNOSIS — M6281 Muscle weakness (generalized): Secondary | ICD-10-CM | POA: Diagnosis not present

## 2021-11-11 DIAGNOSIS — R262 Difficulty in walking, not elsewhere classified: Secondary | ICD-10-CM | POA: Diagnosis present

## 2021-11-11 DIAGNOSIS — R2681 Unsteadiness on feet: Secondary | ICD-10-CM | POA: Diagnosis present

## 2021-11-11 DIAGNOSIS — C719 Malignant neoplasm of brain, unspecified: Secondary | ICD-10-CM | POA: Diagnosis present

## 2021-11-11 DIAGNOSIS — R2689 Other abnormalities of gait and mobility: Secondary | ICD-10-CM | POA: Diagnosis present

## 2021-11-11 DIAGNOSIS — R278 Other lack of coordination: Secondary | ICD-10-CM | POA: Diagnosis present

## 2021-11-11 NOTE — Therapy (Signed)
OUTPATIENT PHYSICAL THERAPY NEURO TREATMENT  Patient Name: Kelly Rollins MRN: [id number] DOB:1970-09-15, 51 y.o., female Today's Date: 11/11/2021   PCP: [name], PA-C REFERRING PROVIDER: [name], Bonnell Public, PA-C   PT End of Session - 11/11/21 1347     Visit Number 8    Number of Visits 25    Date for PT Re-Evaluation 12/09/21    PT Start Time 1351    PT Stop Time 1430    PT Time Calculation (min) 39 min    Equipment Utilized During Treatment Gait belt    Activity Tolerance Patient tolerated treatment well;No increased pain;Patient limited by fatigue    Behavior During Therapy [facility] for tasks assessed/performed                   Past Medical History:  Diagnosis Date   ABDOMINAL PAIN OTHER SPECIFIED SITE 02/21/2009   Qualifier: Diagnosis of  By: Deborra Medina MD, Talia     ABDOMINAL PAIN-LUQ 04/17/2009   Qualifier: Diagnosis of  By: Fuller Plan MD Lamont Snowball T    Anal fissure    Anginal pain ([place])    Asthma    Depression    Diabetes mellitus without complication ([place])    Dyspnea    Dysrhythmia    Headache(784.0)    HEMATOCHEZIA 04/17/2009   Qualifier: Diagnosis of  By: Fuller Plan MD Lamont Snowball T    Hyperlipidemia    Hypertension    IBS (irritable bowel syndrome)    Migraine headache 11/06/2008   Qualifier: Diagnosis of  By: Deborra Medina MD, Talia     OVARIAN CYST, RIGHT 02/21/2009   Qualifier: Diagnosis of  By: Deborra Medina MD, Talia     PALPITATIONS, OCCASIONAL 04/17/2010   Qualifier: Diagnosis of  By: Deborra Medina MD, Talia     Pancreatitis 10/14/2017   Past Surgical History:  Procedure Laterality Date   BREAST EXCISIONAL BIOPSY Left 1997   ? left side, done at time of reduction , benign   BREAST REDUCTION SURGERY     bilateral   CESAREAN SECTION     CHOLECYSTECTOMY     COLONOSCOPY     ESOPHAGOGASTRODUODENOSCOPY     ESOPHAGOGASTRODUODENOSCOPY (EGD) WITH PROPOFOL N/A 10/21/2020   Procedure: ESOPHAGOGASTRODUODENOSCOPY (EGD) WITH PROPOFOL;  Surgeon: Lesly Rubenstein, MD;  Location: [facility] ENDOSCOPY;  Service: Endoscopy;  Laterality: N/A;   REDUCTION MAMMAPLASTY Bilateral 1997   VAGINAL HYSTERECTOMY     Patient Active Problem List   Diagnosis Date Noted   Abnormal MRI, lumbar spine (01/29/2020) 02/19/2020   Anxiety due to MRI 01/25/2020   Claustrophobia associated to MRI 01/25/2020   Enthesopathy of knee region (Right) 01/25/2020   Enthesopathy of knee region (Left) 01/25/2020   Lumbar facet hypertrophy 01/25/2020   Lumbosacral radiculopathy at S1 (Left) 01/17/2020   Lumbosacral radiculopathy (S1) (Left) 01/17/2020   Chronic lower extremity pain (Bilateral) (L>R) 01/17/2020   History of pancreatitis 07/18/2019   Obstructive sleep apnea syndrome 01/02/2019   Lumbar facet arthropathy (Multilevel) (Bilateral) 12/14/2018   Osteoarthritis involving multiple joints 11/28/2018   Chronic hip pain (Left) 11/16/2018   Acute hip pain (Left) 11/16/2018   DDD (degenerative disc disease), lumbosacral 11/16/2018   Chronic hip pain (Secondary source of pain) (Bilateral) (L>R) 05/19/2017   Elevated C-reactive protein (CRP) 05/19/2017   Elevated sed rate 05/19/2017   Spondylosis without myelopathy or radiculopathy, lumbosacral region 05/19/2017   Pharmacologic therapy 05/06/2017   Disorder of skeletal system 05/06/2017   Problems influencing health status 05/06/2017   Chest pain  with moderate risk of acute coronary syndrome 04/21/2017   DOE (dyspnea on exertion) 04/21/2017   Intermittent palpitations 04/21/2017   Vitamin D insufficiency 05/04/2016   Depression 02/04/2016   Fatty liver 02/04/2016   Hypertension associated with diabetes ([place]) 02/04/2016   Chronic pain syndrome 01/15/2016   Acute low back pain 05/28/2015   Lumbar transverse process fracture (HCC) (Left L1) (03/11/15) 05/28/2015   Long term current use of opiate analgesic 05/01/2015   Long term prescription opiate use 05/01/2015   Opiate use (25 MME/Day) 05/01/2015   Encounter for  therapeutic drug level monitoring 05/01/2015   Encounter for pain management planning 05/01/2015   Chronic low back pain (Primary Source of Pain) (Bilateral) (L>R) 05/01/2015   Lumbar facet syndrome (Bilateral) (L>R) 05/01/2015   Chronic knee pain [facility] source of pain) (Bilateral) (L>R) 05/01/2015   Lumbar spondylosis 05/01/2015   Neuropathic pain 05/01/2015   Neurogenic pain 05/01/2015   Myofascial pain 05/01/2015   Essential hypertriglyceridemia 04/26/2014   Type 2 diabetes mellitus with hyperglycemia, with long-term current use of insulin ([place]) 04/26/2014   Anxiety 05/15/2010   Obesity 05/15/2010   Hyperlipidemia 04/03/2010   Fatty liver disease 04/02/2010   Pure hypercholesterolemia 03/19/2010   GERD 04/17/2009   Constipation 04/17/2009   TOBACCO ABUSE 11/06/2008    ONSET DATE: January 2023  REFERRING DIAG: C71.9 (ICD-10-CM) - Malignant neoplasm of brain, unspecified  THERAPY DIAG:  Muscle weakness (generalized)  Other lack of coordination  Unsteadiness on feet  Rationale for Evaluation and Treatment Rehabilitation  SUBJECTIVE:                                                                                                                                                                                              SUBJECTIVE STATEMENT: Pt received good news that imaging showed decreased size of tumor.   Pt accompanied by:  [name]  PAIN:  Are you having pain? 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 Pt's husband, [name], present with pt. [name] provides majority of hx as pt has difficulty with speech due to Broca's aphasia (working with SLP). Pt reports partial paralysis of R side. She is unable to move her RUE, RLE also affected. Pt ambulates with hemiwalker in their home for short  distances and does not use WC at home. She fatigues quickly with ambulation. Pt has AFO for RLE but unable to determine how to donn it without it hurting her LE. Pt currently  with wound on R calf covered by large bandage that they have been treating for at least a month (reports physician aware). In addition to wound on R calf, pt reports R side skin irritation under breast and lateral trunk due to bra underwire. They have since removed underwire. Pt was hospitalized 10 days in June due to UTI. She has had at least 12 falls in the last six months. Reports no injuries with falls, and states "just bumps and bruises." Pt reports she has constant head pain. Pt spouse reports pt has pain throughout her whole right side down to LE. Can have bad cramps in RUE and RLE. Reports no numbness/tingling. Occ dizziness, unable to describe further. Pt sleeps in recliner, wakes up stiff in the morning. PMH per chart includes: Anorexia, dehydration, Adult Failure to thrive, contracture of muscles multiple sites. Pt./caregiver reports recently being hospitalized with a UTI, and dehydration. Extensive hx, please refer to chart for full details.   PAIN:  Are you having pain? Yes: NPRS scale: 3/10 Pain location: Top of head, front Pain description: States, "cancer pain" head pain Aggravating factors: unsure Relieving factors: medications  PRECAUTIONS: Fall  WEIGHT BEARING RESTRICTIONS No  FALLS: Has patient fallen in last 6 months? Yes. Number of falls 12  LIVING ENVIRONMENT: information primarily from chart Lives with: lives with their spouse Lives in: House/apartment Stairs: Yes: External: 4 steps; bilateral but cannot reach both Has following equipment at home: Single point cane, Walker - 4 wheeled, Hemi walker, shower chair, bed side commode, Grab bars, and lift chair  PLOF: Independent  PATIENT GOALS  "Normal function" as much as possible. Improving her balance.  OBJECTIVE:   DIAGNOSTIC FINDINGS:   MR  BRAIN W WO CONTRAST 02/06/2021:  "IMPRESSION: Masslike T2 signal in the anterior left frontal lobe, without associated enhancement, most concerning for a low-grade glioma. Additional areas of increased T2 signal in the left frontal lobe may also infiltrative low-grade glioma or associated edema."  CT HEAD WO CONTRAST (5MM) 02/06/2021: "IMPRESSION: High left anterior frontal edema with new adjacent masslike hyperdensity involving the left frontal white matter, high left left frontal cortex, and genu of corpus callosum. Overall, findings are suspicious for malignancy (including hypercellular tumors such as GBM and lymphoma). Acute or subacute left ACA territory infarct is a differential consideration, but thought less likely. Recommend MRI with contrast for further evaluation."   COGNITION: Overall cognitive status: Within functional limits for tasks assessed   SENSATION: Impaired in RLE - diminished and absent throughout R ankle/foot - noted wound on calf - LLE intact to light touch   COORDINATION:  Finger to chin with LUE - few errors otherwise able to complete. Unable to perform RUE.  EDEMA: noted B ankles   MUSCLE TONE: RLE: Mild-moderate. Pt unable to get R heel on floor in weightbearing position, plantarflexed  LOWER EXTREMITY MMT:     LLE grossly 4/5 throughout Trace activation RLE in chair with majority of muscles tested. RLE generally <3/5. Exception R hip abductors 3-/5, would benefit from further assessment in supine.    TRANSFERS: Assistive device utilized: Hemi walker  Sit to stand:  Attempted 5xSTS in session, unable to complete but did perform 2x in session with mod a.  Spouse reports pt able to perform transfers with 20% help at home from chair of increased height Stand to sit: Mod Ax2   GAIT: Gait pattern: step to pattern, RLE fixed in PF, externally rotated, uses L hemiwalker Distance walked: 20 ft Assistive device utilized:  Hemi walker Level of  assistance:  close CGA Comments: heavy LUE weightbearing on HW  FUNCTIONAL TESTs:  5 times sit to stand: unable to complete from standard chair height, performs 1 STS  PATIENT SURVEYS:  FOTO unable to complete due to error indicating FOTO not available in system  TODAY'S TREATMENT:   TherEx: Heat applied to low back x 10 min while pt performs interventions, reports heat feels good. No adverse reaction to treatment. Pt reports nothing applied to low back that would prohibit use of heat.  UBE Bike x 5 min low intensity LUE only, pt achieves moderate intensity  2x5, 1x2 STS mod assist in // bars (pull to stand)  Ambulation FWD/BCKWD in // bars, close CGA x multiple reps with 2 rest breaks.   Seated LAQ 20x LLE,  RLE 10x with soccer ball as external cue  -Pt then repeats interventions with 1.5# AW donned. Rates easy  Seated trunk ext over chair backrest 10x to promote improved upright posture and decrease risk of contracture  1.5# AW each LE seated march 10x each LE, TC/external cue for RLE to improve ROM  PT places cushion in session under L hip to address pelvic obliquity in chair (removes after assessment). Recommends to pt and spouse that pt may benefit from [facility] eval for more appropriate chair/cushioning and to seek referral from MD. Pt and spouse verbalized understanding.  PATIENT EDUCATION: Education details: Pt educated throughout session about proper posture and technique with exercises. Improved exercise technique, movement at target joints, use of target muscles after min to mod verbal, visual, tactile cues.  Person educated: pt and her spouse Education method:Explanation, Demonstration, Tactile cues, and Verbal cues Education comprehension: verbalized understanding, returned demonstration, tactile cues required, and needs further education   HOME EXERCISE PROGRAM: No updates on this date, pt to continue HEP as previously given Access Code: [email or web address] URL:  [email or web address] Date: 10/02/2021 Prepared by: Sande Brothers  Exercises - Seated Hip Flexion March with Ankle Weights  - 1 x daily - 7 x weekly - 3 sets - 10 reps - Seated Long Arc Quad  - 1 x daily - 7 x weekly - 3 sets - 10 reps - Supine Quad Set  - 1 x daily - 7 x weekly - 3 sets - 10 reps - Proper Sit to Stand Technique  - 1 x daily - 7 x weekly - 3 sets - 10 reps  GOALS: Goals reviewed with patient? Yes  SHORT TERM GOALS: Target date: 10/28/2021  Patient will be independent in home exercise program to improve strength/mobility for better functional independence with ADLs. Baseline: to be initiated next 1-2 sessions Goal status: INITIAL   LONG TERM GOALS: Target date: 12/09/2021  Patient will increase FOTO score to equal to or greater than     to demonstrate statistically significant improvement in mobility and quality of life.  Baseline: to be initiated next 1-2 visits Goal status: INITIAL  2.   Patient (< 72 years old) will be able to perform and complete five times sit to stand test from standard chair height in <30 seconds to indicate increased LE strength and improved balance. Baseline: Pt able to complete only 1 stand, insufficient strength currently to complete full test Goal status: INITIAL  3.  Pt will improve LLE strength by at least 1/2 point on MMT in order to increase ease with transfers and functional mobility. Baseline: LLE grossly 4/5 throughout Goal status: INITIAL  4.  Patient will increase 10 meter walk  test to at least 0.80 m/s as to improve gait speed for better community ambulation and to reduce fall risk. Baseline: to be tested next 1-2 visits Goal status: INITIAL  5.  Pt will complete STS from standard height chair with BUE assist off armrests and with no greater than min assist to improve ease and safety with transfers. Baseline: mod a x2 Goal status: INITIAL   ASSESSMENT:  CLINICAL IMPRESSION: Pt presents to PT session  feeling better. She is able to advance seated therex with increased resistance. Pt also initiated cardioresp and UE mm endurance training on UBE bike today, tolerated well without significant fatigue and without pain. The pt would benefit from continued skilled PT to address immobility, LE muscle weakness, and pain in order to decrease fall risk and increase functional mobility and QOL.   OBJECTIVE IMPAIRMENTS Abnormal gait, decreased activity tolerance, decreased balance, decreased coordination, decreased endurance, decreased mobility, difficulty walking, decreased ROM, decreased strength, hypomobility, increased edema, increased fascial restrictions, increased muscle spasms, impaired flexibility, impaired sensation, impaired tone, impaired UE functional use, improper body mechanics, postural dysfunction, and pain.   ACTIVITY LIMITATIONS carrying, lifting, bending, sitting, standing, squatting, stairs, transfers, bed mobility, bathing, toileting, dressing, reach over head, hygiene/grooming, locomotion level, and caring for others  PARTICIPATION LIMITATIONS: meal prep, cleaning, laundry, medication management, personal finances, driving, shopping, community activity, occupation, and yard work  PERSONAL Education officer, community, Past/current experiences, Sex, and 3+ comorbidities: Anorexia, dehydration, Adult Failure to thrive, contracture of muscles multiple sites, multiple falls, UTI, extensive PMH refer to chart  are also affecting patient's functional outcome.   REHAB POTENTIAL: Fair    CLINICAL DECISION MAKING: Evolving/moderate complexity  EVALUATION COMPLEXITY: High  PLAN: PT FREQUENCY: 2x/week  PT DURATION: 12 weeks  PLANNED INTERVENTIONS: Therapeutic exercises, Therapeutic activity, Neuromuscular re-education, Balance training, Gait training, Patient/Family education, Self Care, Joint mobilization, Joint manipulation, Stair training, Vestibular training, Canalith repositioning, Orthotic/Fit  training, DME instructions, Wheelchair mobility training, Spinal mobilization, Cryotherapy, Moist heat, scar mobilization, Splintting, Taping, Manual therapy, and Re-evaluation  PLAN FOR NEXT SESSION:  stretching, strengthening, gait, balance, continue plan   Zollie Pee, PT 11/11/2021, 5:34 PM

## 2021-11-12 NOTE — Therapy (Signed)
OUTPATIENT OCCUPATIONAL THERAPY TREATMENT NOTE  Patient Name: Kelly Rollins MRN: [id number] DOB:[birth year], 51 y.o., female Today's Date: 11/12/2021  PCP: [name], PA-C REFERRING PROVIDER: [name], Corrine Jeani Hawking, PA-C   OT End of Session - 10/30/21 1549     Visit Number 17    Number of Visits 66    Date for OT Re-Evaluation 01/22/22    Authorization Time Period Progress reports period starting 09/08/2021    OT Start Time 1515    OT Stop Time 1600    OT Time Calculation (min) 45 min    Activity Tolerance Patient tolerated treatment well    Behavior During Therapy [facility] for tasks assessed/performed             Past Medical History:  Diagnosis Date   ABDOMINAL PAIN OTHER SPECIFIED SITE 02/21/2009   Qualifier: Diagnosis of  By: Deborra Medina MD, Talia     ABDOMINAL PAIN-LUQ 04/17/2009   Qualifier: Diagnosis of  By: Fuller Plan MD Lamont Snowball T    Anal fissure    Anginal pain ([place])    Asthma    Depression    Diabetes mellitus without complication ([place])    Dyspnea    Dysrhythmia    Headache(784.0)    HEMATOCHEZIA 04/17/2009   Qualifier: Diagnosis of  By: Fuller Plan MD Lamont Snowball T    Hyperlipidemia    Hypertension    IBS (irritable bowel syndrome)    Migraine headache 11/06/2008   Qualifier: Diagnosis of  By: Deborra Medina MD, Talia     OVARIAN CYST, RIGHT 02/21/2009   Qualifier: Diagnosis of  By: Deborra Medina MD, Talia     PALPITATIONS, OCCASIONAL 04/17/2010   Qualifier: Diagnosis of  By: Deborra Medina MD, Talia     Pancreatitis 10/14/2017   Past Surgical History:  Procedure Laterality Date   BREAST EXCISIONAL BIOPSY Left 1997   ? left side, done at time of reduction , benign   BREAST REDUCTION SURGERY     bilateral   CESAREAN SECTION     CHOLECYSTECTOMY     COLONOSCOPY     ESOPHAGOGASTRODUODENOSCOPY     ESOPHAGOGASTRODUODENOSCOPY (EGD) WITH PROPOFOL N/A 10/21/2020   Procedure: ESOPHAGOGASTRODUODENOSCOPY (EGD) WITH PROPOFOL;  Surgeon: Lesly Rubenstein, MD;  Location: [facility]  ENDOSCOPY;  Service: Endoscopy;  Laterality: N/A;   REDUCTION MAMMAPLASTY Bilateral 1997   VAGINAL HYSTERECTOMY     Patient Active Problem List   Diagnosis Date Noted   Abnormal MRI, lumbar spine (01/29/2020) 02/19/2020   Anxiety due to MRI 01/25/2020   Claustrophobia associated to MRI 01/25/2020   Enthesopathy of knee region (Right) 01/25/2020   Enthesopathy of knee region (Left) 01/25/2020   Lumbar facet hypertrophy 01/25/2020   Lumbosacral radiculopathy at S1 (Left) 01/17/2020   Lumbosacral radiculopathy (S1) (Left) 01/17/2020   Chronic lower extremity pain (Bilateral) (L>R) 01/17/2020   History of pancreatitis 07/18/2019   Obstructive sleep apnea syndrome 01/02/2019   Lumbar facet arthropathy (Multilevel) (Bilateral) 12/14/2018   Osteoarthritis involving multiple joints 11/28/2018   Chronic hip pain (Left) 11/16/2018   Acute hip pain (Left) 11/16/2018   DDD (degenerative disc disease), lumbosacral 11/16/2018   Chronic hip pain (Secondary source of pain) (Bilateral) (L>R) 05/19/2017   Elevated C-reactive protein (CRP) 05/19/2017   Elevated sed rate 05/19/2017   Spondylosis without myelopathy or radiculopathy, lumbosacral region 05/19/2017   Pharmacologic therapy 05/06/2017   Disorder of skeletal system 05/06/2017   Problems influencing health status 05/06/2017   Chest pain with moderate risk of acute coronary syndrome 04/21/2017  DOE (dyspnea on exertion) 04/21/2017   Intermittent palpitations 04/21/2017   Vitamin D insufficiency 05/04/2016   Depression 02/04/2016   Fatty liver 02/04/2016   Hypertension associated with diabetes ([place]) 02/04/2016   Chronic pain syndrome 01/15/2016   Acute low back pain 05/28/2015   Lumbar transverse process fracture (HCC) (Left L1) (03/11/15) 05/28/2015   Long term current use of opiate analgesic 05/01/2015   Long term prescription opiate use 05/01/2015   Opiate use (25 MME/Day) 05/01/2015   Encounter for therapeutic drug level monitoring  05/01/2015   Encounter for pain management planning 05/01/2015   Chronic low back pain (Primary Source of Pain) (Bilateral) (L>R) 05/01/2015   Lumbar facet syndrome (Bilateral) (L>R) 05/01/2015   Chronic knee pain [facility] source of pain) (Bilateral) (L>R) 05/01/2015   Lumbar spondylosis 05/01/2015   Neuropathic pain 05/01/2015   Neurogenic pain 05/01/2015   Myofascial pain 05/01/2015   Essential hypertriglyceridemia 04/26/2014   Type 2 diabetes mellitus with hyperglycemia, with long-term current use of insulin ([place]) 04/26/2014   Anxiety 05/15/2010   Obesity 05/15/2010   Hyperlipidemia 04/03/2010   Fatty liver disease 04/02/2010   Pure hypercholesterolemia 03/19/2010   GERD 04/17/2009   Constipation 04/17/2009   TOBACCO ABUSE 11/06/2008    ONSET DATE: 02/06/2021  REFERRING DIAG: Astrocytoma  THERAPY DIAG:  Muscle weakness (generalized)  Other lack of coordination  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HCC)  Rationale for Evaluation and Treatment Rehabilitation  SUBJECTIVE:     SUBJECTIVE STATEMENT: Pt reported that she had some exciting news to share today.  Pt stated that her recent scan showed that her tumor has decreased in size since the last scan, and that she is currently stable and will not have to undergo chemo at this time.   Pt accompanied by: significant other  PERTINENT HISTORY:  Pt. Is a 51 y.o. 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 PMHx includes: Anorexia, dehydration, Adult Failure to thrive, contracture of muscles multiple sites. Pt./caregiver reports recently being hospitalized with a UTI, and dehydration.   PRECAUTIONS: Fall  WEIGHT BEARING RESTRICTIONS No  PAIN:  Are you having pain?  Pt. reports 6/10 pain in the entire R side.   FALLS: Has patient fallen in last 6 months? Yes. Number of falls Multiple Falls  LIVING ENVIRONMENT: Lives with: Family Lives in:  House/apartment Stairs: Yes: External: 4 steps; bilateral but cannot reach both Has following equipment at home: Single point cane, Walker - 4 wheeled, Hemi walker, shower chair, bed side commode, and Grab bars Lift chair in the tub, [place],  PLOF: Independent  PATIENT GOALS: To improve the right UE  OBJECTIVE:   HAND DOMINANCE: Right  ADLs: Transfers/ambulation related to ADLs: Eating: MaxA with cutting food, setting up, Modified I with nondominant Left hand, [place]  Grooming: Independent  UB Dressing: MaxA  LB Dressing: MaxA Toileting: Uses a BSCommode mInA transfers, MaxA clothing negotiation Bathing: MaxA Tub Shower transfers:  Uses a shower lift chair Equipment:  w/c, BSCommode, cane   IADLs: Shopping: Husband performs Light housekeeping: Max Meal Prep: MaxA Community mobility: Relies on family Medication management:Husband assists Doctor, hospital: Performs together Handwriting: Not legible  MOBILITY STATUS: Hx of falls   ACTIVITY TOLERANCE: Activity tolerance: limited  FUNCTIONAL OUTCOME MEASURES: FOTO: 11.76  UPPER EXTREMITY ROM  : WFL Left AROM/PROM   Passive ROM Right eval Right  08/18/21 Right 09/08/2021 Right 10/30/2021  Shoulder flexion 30   48 15  Shoulder abduction 40 62 53 45  Shoulder adduction         Shoulder extension         Shoulder internal rotation         Shoulder external rotation         Elbow flexion 120   110 122  Elbow extension -67   -46 -72  Wrist flexion Neutral   Neutral neutral  Wrist extension 12   40 25  Wrist ulnar deviation         Wrist radial deviation         Wrist pronation         Wrist supination         (Blank rows = not tested)     UPPER EXTREMITY MMT:      MMT Right eval Left eval  Shoulder flexion 0/5 4+/5  Shoulder abduction 0/5 4+/5  Shoulder adduction      Shoulder extension      Shoulder internal rotation      Shoulder external rotation      Middle trapezius      Lower trapezius       Elbow flexion 0/5 4+/5  Elbow extension 0/5 4+/5  Wrist flexion      Wrist extension 0/5 4+/5  Wrist ulnar deviation      Wrist radial deviation      Wrist pronation      Wrist supination      (Blank rows = not tested)   FOTO score: 09/08/2021: 11   SENSATION: Light touch: WFL  EDEMA:   Wrist: 18.5 cm MCP: 21.5 cm  10/30/2021:  Wrist: 17.5 cm MCPs: 20cm    MUSCLE TONE: RUE: Severe  COGNITION: Overall cognitive status: Impaired  VISION: Subjective report: Wears glasses Baseline vision: Wears glasses all the time Pt. Reports no changes    PRAXIS: Impaired: Motor planning  TODAY'S TREATMENT:  Moist heat applied to R shoulder and R hand x 5 min for pain reduction/muscle relaxation in prep for passive stretching to the RUE.    Therapeutic Exercise: Placed hot pack to lumbar spine to promote more erect sitting posture while performing passive shoulder retraction stretch with 20 sec hold x3.  Pt responded well to this and OT encouraged spouse perform this stretch at home when able, using towel roll to lumbar spine for optimal sitting posture.  Performed R scapular gliding for abd/add and elevation depression.  Attempted slow rocking for passive shoulder flex/ext and abd/add with poor tolerance.  Placed rolled towel beneath R axilla to promote gentle shoulder abd stretch.  Pt tolerated this for ~15 min.  Encouraged pt/spouse use this technique at home for optimal positioning of RUE away from side.  Performed passive elbow flex/ext, forearm pron/sup, wrist flex/ext, and digit flex/ext/abd.  With slow, prolonged stretch, OT was able to extend digits for pt to rest hand in open position on top of hot pack, though pt tolerated this open hand for up to 2 min before she felt cramping in the hand.    PATIENT EDUCATION: Education details: Right UE ROM, positioning of the RUE Person educated: Patient and Spouse Education method: Explanation, Demonstration, Tactile cues, and Verbal  cues Education comprehension: verbalized understanding, returned demonstration, and needs further education   HOME EXERCISE PROGRAM:   Pt./caregiver education about positioning of the RUE, ROM    GOALS: Goals reviewed with patient? Yes  SHORT TERM GOALS: Target date: 12/11/2021      Pt./caregivers will demonstrate independence  with HEPs for UE ROM, and edema control Baseline: 10/30/2021: Pt. Edema has improved. Pt. Requires caregiver assist for positioning. 09/08/2021: Pt. Requires assist for positioning, ROM, edema control. Eval: No current HEPs  Goal status: Ongoing    LONG TERM GOALS: Target date: 01/22/2022    Pt. Will improve right hand edema by 1 cm at the wrist, and MCPs Baseline: 10/30/2021:  wrist: 17.5 cm, MCPs: 20 cm 09/08/2021: Wrist: 17.5 cm, MCPs: 20.5 cm Eval: Wrist: 18.5cm, MCPs: 21.5cm Goal status: Ongoing  2.  Pt. Will improve right shoulder PROM by 10 degrees with ADLs Baseline: 10/30/2021: shoulder flexion: 15, abduction: 45 09/08/2021: Right shoulder flexion: 48, abduction: 53 Eval: PROM shoulder flexion: 30 degrees, Abduction: 40 degrees Goal status: Ongoing  3.  Pt. Will improve right elbow PROM by 10 degrees to prepare for hand to mouth patterns Baseline: 10/30/2021:elbow extension -72, flexion: 122 09/08/2021: elbow extension: -46, flexion: 110 Eval:   elbow extension -67, flexion 120 Goal status: Ongoing  4.  Pt. Will improve PROM right wrist extension by 10 degrees Baseline: 10/30/2021: wrist extension 25 09/08/2021: Wrist extension: 40 Eval: wrist extension 12 degrees, flexion to neutral Goal status: Ongoing  5.  Pt. Will tolerate full digit extension in the right hand to promote good skin integrity through the palmar surface of the right hand  Baseline: 10/30/2021: Pt. Tolerates ROM through 50% of  digit extension range, occasionally 75% 09/08/2021: Pt. Is able to tolerate PROM through 50% of digit extension in the digits. Eval: Pt. Maintains digits in  tight flexion Goal status: Ongoing  6.  Pt. Will require minA UE dressing using one armed dressing techniques. Baseline: 10/30/2021: Independent with button down shirt/sweater/cardigan, MaxA pullover shirt.09/08/2021: MaxA Eval: MaxA Goal status: Ongoing   ASSESSMENT:  CLINICAL IMPRESSION: Pt was excited to report that recent scan showed that her tumor has decreased in size since the last scan, and that she is currently stable and will not have to undergo chemo at this time.  Pt continues to present with high pain levels and severe spasticity throughout the RUE.  Pt responded well to moist heat at the R shoulder and hand, and tolerated scapular retraction well with a hot pack to lumbar spine.  OT encouraged rolled towel for lumbar support at home for spouse to perform this stretch, working to counteract pt's rounded shoulders and flexed posture in sitting.  Pt tolerated a rolled towel beneath R axilla to promote prolonged shoulder abd stretch.  Encouraged using this technique at home as well.  OT was able to extend digits for pt to rest hand in open position on top of hot pack, though pt tolerated this open hand for up to 2 min before she felt cramping in the hand.  Despite gentle ROM throughout session, pt reported pain at 8/10 by end of session, though she states her pain is always worse after therapy, but then improves after rest and pt states she eventually feels better than when she came in, feeling more relaxed in the RUE.  OT reinforced benefits of rolled towels or pillows, as noted in tx session, to achieve optimal sitting posture and positioning of the RUE.  Pt/spouse verbalized understanding.     Pt continues to benefit from OT services to work towards improving pain, ROM, and to increase engagement of the RUE during ADLs, and IADLs.    PERFORMANCE DEFICITS in functional skills including ADLs, IADLs, coordination, proprioception, sensation, edema, tone, ROM, strength, pain, FMC, GMC, balance,  decreased knowledge of  use of DME, skin integrity, and UE functional use, cognitive skills including attention, memory, and safety awareness, and psychosocial skills including coping strategies.   IMPAIRMENTS are limiting patient from ADLs, IADLs, education, work, leisure, and social participation.   COMORBIDITIES may have co-morbidities  that affects occupational performance. Patient will benefit from skilled OT to address above impairments and improve overall function.  MODIFICATION OR ASSISTANCE TO COMPLETE EVALUATION: Maximum modification of tasks or assist with assess necessary to complete an evaluation.  OT OCCUPATIONAL PROFILE AND HISTORY: Detailed assessment: Review of records and additional review of physical, cognitive, psychosocial history related to current functional performance.  CLINICAL DECISION MAKING: High - multiple treatment options, significant modification of task necessary  REHAB POTENTIAL: Good for stated goals  EVALUATION COMPLEXITY: High    PLAN: OT FREQUENCY: 2x/week  OT DURATION: 12 weeks  PLANNED INTERVENTIONS: self care/ADL training, therapeutic exercise, therapeutic activity, neuromuscular re-education, manual therapy, passive range of motion, paraffin, moist heat, contrast bath, patient/family education, cognitive remediation/compensation, energy conservation, and coping strategies training  RECOMMENDED OTHER SERVICES: PT, and ST  CONSULTED AND AGREED WITH PLAN OF CARE: Patient and family member/caregiver  PLAN FOR NEXT SESSION: Initiate PROM, and RUE Edema control techniques   Leta Speller, MS, OTR/L  Darleene Cleaver, OT 11/12/2021, 8:42 AM

## 2021-11-13 ENCOUNTER — Encounter: Payer: Medicare Other | Admitting: Speech Pathology

## 2021-11-13 ENCOUNTER — Encounter: Payer: Self-pay | Admitting: Occupational Therapy

## 2021-11-13 ENCOUNTER — Ambulatory Visit: Payer: Medicare Other

## 2021-11-13 ENCOUNTER — Ambulatory Visit: Payer: Medicare Other | Admitting: Occupational Therapy

## 2021-11-13 DIAGNOSIS — R278 Other lack of coordination: Secondary | ICD-10-CM

## 2021-11-13 DIAGNOSIS — M6281 Muscle weakness (generalized): Secondary | ICD-10-CM

## 2021-11-13 DIAGNOSIS — R2689 Other abnormalities of gait and mobility: Secondary | ICD-10-CM

## 2021-11-13 NOTE — Therapy (Signed)
OUTPATIENT OCCUPATIONAL THERAPY TREATMENT NOTE  Patient Name: DOMINICK ZERTUCHE MRN: [id number] DOB:[birth year], 51 y.o., female Today's Date: 11/13/2021  PCP: [name], PA-C REFERRING PROVIDER: [name], Corrine Jeani Hawking, PA-C    OT End of Session - 11/13/21 1752     Visit Number 19    Number of Visits 19    Date for OT Re-Evaluation 01/22/22    Authorization Time Period Progress reporting period starting 09/08/2021    OT Start Time 1515    OT Stop Time 1600    OT Time Calculation (min) 45 min    Activity Tolerance Patient tolerated treatment well    Behavior During Therapy [facility] for tasks assessed/performed                    Past Medical History:  Diagnosis Date   ABDOMINAL PAIN OTHER SPECIFIED SITE 02/21/2009   Qualifier: Diagnosis of  By: Deborra Medina MD, Talia     ABDOMINAL PAIN-LUQ 04/17/2009   Qualifier: Diagnosis of  By: Fuller Plan MD Lamont Snowball T    Anal fissure    Anginal pain ([place])    Asthma    Depression    Diabetes mellitus without complication ([place])    Dyspnea    Dysrhythmia    Headache(784.0)    HEMATOCHEZIA 04/17/2009   Qualifier: Diagnosis of  By: Fuller Plan MD Lamont Snowball T    Hyperlipidemia    Hypertension    IBS (irritable bowel syndrome)    Migraine headache 11/06/2008   Qualifier: Diagnosis of  By: Deborra Medina MD, Talia     OVARIAN CYST, RIGHT 02/21/2009   Qualifier: Diagnosis of  By: Deborra Medina MD, Talia     PALPITATIONS, OCCASIONAL 04/17/2010   Qualifier: Diagnosis of  By: Deborra Medina MD, Talia     Pancreatitis 10/14/2017   Past Surgical History:  Procedure Laterality Date   BREAST EXCISIONAL BIOPSY Left 1997   ? left side, done at time of reduction , benign   BREAST REDUCTION SURGERY     bilateral   CESAREAN SECTION     CHOLECYSTECTOMY     COLONOSCOPY     ESOPHAGOGASTRODUODENOSCOPY     ESOPHAGOGASTRODUODENOSCOPY (EGD) WITH PROPOFOL N/A 10/21/2020   Procedure: ESOPHAGOGASTRODUODENOSCOPY (EGD) WITH PROPOFOL;  Surgeon: Lesly Rubenstein, MD;   Location: [facility] ENDOSCOPY;  Service: Endoscopy;  Laterality: N/A;   REDUCTION MAMMAPLASTY Bilateral 1997   VAGINAL HYSTERECTOMY     Patient Active Problem List   Diagnosis Date Noted   Abnormal MRI, lumbar spine (01/29/2020) 02/19/2020   Anxiety due to MRI 01/25/2020   Claustrophobia associated to MRI 01/25/2020   Enthesopathy of knee region (Right) 01/25/2020   Enthesopathy of knee region (Left) 01/25/2020   Lumbar facet hypertrophy 01/25/2020   Lumbosacral radiculopathy at S1 (Left) 01/17/2020   Lumbosacral radiculopathy (S1) (Left) 01/17/2020   Chronic lower extremity pain (Bilateral) (L>R) 01/17/2020   History of pancreatitis 07/18/2019   Obstructive sleep apnea syndrome 01/02/2019   Lumbar facet arthropathy (Multilevel) (Bilateral) 12/14/2018   Osteoarthritis involving multiple joints 11/28/2018   Chronic hip pain (Left) 11/16/2018   Acute hip pain (Left) 11/16/2018   DDD (degenerative disc disease), lumbosacral 11/16/2018   Chronic hip pain (Secondary source of pain) (Bilateral) (L>R) 05/19/2017   Elevated C-reactive protein (CRP) 05/19/2017   Elevated sed rate 05/19/2017   Spondylosis without myelopathy or radiculopathy, lumbosacral region 05/19/2017   Pharmacologic therapy 05/06/2017   Disorder of skeletal system 05/06/2017   Problems influencing health status 05/06/2017   Chest pain with moderate  risk of acute coronary syndrome 04/21/2017   DOE (dyspnea on exertion) 04/21/2017   Intermittent palpitations 04/21/2017   Vitamin D insufficiency 05/04/2016   Depression 02/04/2016   Fatty liver 02/04/2016   Hypertension associated with diabetes ([place]) 02/04/2016   Chronic pain syndrome 01/15/2016   Acute low back pain 05/28/2015   Lumbar transverse process fracture (HCC) (Left L1) (03/11/15) 05/28/2015   Long term current use of opiate analgesic 05/01/2015   Long term prescription opiate use 05/01/2015   Opiate use (25 MME/Day) 05/01/2015   Encounter for therapeutic drug  level monitoring 05/01/2015   Encounter for pain management planning 05/01/2015   Chronic low back pain (Primary Source of Pain) (Bilateral) (L>R) 05/01/2015   Lumbar facet syndrome (Bilateral) (L>R) 05/01/2015   Chronic knee pain [facility] source of pain) (Bilateral) (L>R) 05/01/2015   Lumbar spondylosis 05/01/2015   Neuropathic pain 05/01/2015   Neurogenic pain 05/01/2015   Myofascial pain 05/01/2015   Essential hypertriglyceridemia 04/26/2014   Type 2 diabetes mellitus with hyperglycemia, with long-term current use of insulin ([place]) 04/26/2014   Anxiety 05/15/2010   Obesity 05/15/2010   Hyperlipidemia 04/03/2010   Fatty liver disease 04/02/2010   Pure hypercholesterolemia 03/19/2010   GERD 04/17/2009   Constipation 04/17/2009   TOBACCO ABUSE 11/06/2008    ONSET DATE: 02/06/2021  REFERRING DIAG: Astrocytoma  THERAPY DIAG:  Muscle weakness (generalized)  Rationale for Evaluation and Treatment Rehabilitation  SUBJECTIVE:     SUBJECTIVE STATEMENT:  Pt. Reports that she has an infusion next Thursday, so she may not be able to attend therapy.  Pt accompanied by: significant other  PERTINENT HISTORY:  Pt. Is a 51 y.o. 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 PMHx includes: Anorexia, dehydration, Adult Failure to thrive, contracture of muscles multiple sites. Pt./caregiver reports recently being hospitalized with a UTI, and dehydration.   PRECAUTIONS: Fall  WEIGHT BEARING RESTRICTIONS No  PAIN:  Are you having pain?  Pt. reports 3/10 pain in the entire R side.   FALLS: Has patient fallen in last 6 months? Yes. Number of falls Multiple Falls  LIVING ENVIRONMENT: Lives with: Family Lives in: House/apartment Stairs: Yes: External: 4 steps; bilateral but cannot reach both Has following equipment at home: Single point cane, Walker - 4 wheeled, Hemi walker, shower chair, bed side  commode, and Grab bars Lift chair in the tub, [place],  PLOF: Independent  PATIENT GOALS: To improve the right UE  OBJECTIVE:   HAND DOMINANCE: Right  ADLs: Transfers/ambulation related to ADLs: Eating: MaxA with cutting food, setting up, Modified I with nondominant Left hand, [place]  Grooming: Independent  UB Dressing: MaxA  LB Dressing: MaxA Toileting: Uses a BSCommode mInA transfers, MaxA clothing negotiation Bathing: MaxA Tub Shower transfers:  Uses a shower lift chair Equipment:  w/c, BSCommode, cane   IADLs: Shopping: Husband performs Light housekeeping: Max Meal Prep: MaxA Community mobility: Relies on family Medication management:Husband assists Doctor, hospital: Performs together Handwriting: Not legible  MOBILITY STATUS: Hx of falls   ACTIVITY TOLERANCE: Activity tolerance: limited  FUNCTIONAL OUTCOME MEASURES: FOTO: 11.76  UPPER EXTREMITY ROM  : WFL Left AROM/PROM   Passive ROM Right eval Right  08/18/21 Right 09/08/2021 Right 10/30/2021  Shoulder flexion 30   48 15  Shoulder abduction 40 62 53 45  Shoulder adduction         Shoulder extension         Shoulder internal rotation  Shoulder external rotation         Elbow flexion 120   110 122  Elbow extension -67   -46 -72  Wrist flexion Neutral   Neutral neutral  Wrist extension 12   40 25  Wrist ulnar deviation         Wrist radial deviation         Wrist pronation         Wrist supination         (Blank rows = not tested)     UPPER EXTREMITY MMT:      MMT Right eval Left eval  Shoulder flexion 0/5 4+/5  Shoulder abduction 0/5 4+/5  Shoulder adduction      Shoulder extension      Shoulder internal rotation      Shoulder external rotation      Middle trapezius      Lower trapezius      Elbow flexion 0/5 4+/5  Elbow extension 0/5 4+/5  Wrist flexion      Wrist extension 0/5 4+/5  Wrist ulnar deviation      Wrist radial deviation      Wrist pronation      Wrist  supination      (Blank rows = not tested)   FOTO score: 09/08/2021: 11   SENSATION: Light touch: WFL  EDEMA:   Wrist: 18.5 cm MCP: 21.5 cm  10/30/2021:  Wrist: 17.5 cm MCPs: 20cm    MUSCLE TONE: RUE: Severe  COGNITION: Overall cognitive status: Impaired  VISION: Subjective report: Wears glasses Baseline vision: Wears glasses all the time Pt. Reports no changes    PRAXIS: Impaired: Motor planning  TODAY'S TREATMENT:   Pt. tolerated PROM for  shoulder flexion, and abduction. AAROM/PROM for shoulder flexion at the pillow type moving the arm out to the edge of the pillow and back.  Patient tolerated right wrist flexion and extension, radial and ulnar deviation, forearm supination/pronation, digit flexion and extension, and thumb abduction passive range of motion.  Pt./caregiver education was provided about positioning to increase progressive passive stretching of right shoulder abduction with pillows.   Pt. reports 3/10 pain in the right shoulder today. Pt.'s right shoulder flexion, abduction, and digit extension ROM continues to be limited by pain today.  Patient presents with increased flexor tone, stiffness, and tightness in the RUE. pt. presents with increased flexor tightness in the right 2nd, and 3rd digits. Pt. continues to initially maintain the digits on her right hand in tight flexor synergy pattern. Pt. was able to tolerate retrograde massage for edema control, and ROM to the right hand, and digits. Pt. continues to work on improving pain, and improving RUE ROM in order to improve engagement with ADLs/IADLs, and promote good skin integrity through the palmar surface of the hand.  PATIENT EDUCATION: Education details: Right UE ROM, positioning of the RUE Person educated: Patient and Spouse Education method: Explanation, Demonstration, Tactile cues, and Verbal cues Education comprehension: verbalized understanding, returned demonstration, and needs further  education   HOME EXERCISE PROGRAM:   Pt./caregiver education about positioning of the RUE, ROM    GOALS: Goals reviewed with patient? Yes  SHORT TERM GOALS: Target date: 12/11/2021      Pt./caregivers will demonstrate independence with HEPs for UE ROM, and edema control Baseline: 10/30/2021: Pt. Edema has improved. Pt. Requires caregiver assist for positioning. 09/08/2021: Pt. Requires assist for positioning, ROM, edema control. Eval: No current HEPs  Goal status: Ongoing    LONG TERM  GOALS: Target date: 01/22/2022    Pt. Will improve right hand edema by 1 cm at the wrist, and MCPs Baseline: 10/30/2021:  wrist: 17.5 cm, MCPs: 20 cm 09/08/2021: Wrist: 17.5 cm, MCPs: 20.5 cm Eval: Wrist: 18.5cm, MCPs: 21.5cm Goal status: Ongoing  2.  Pt. Will improve right shoulder PROM by 10 degrees with ADLs Baseline: 10/30/2021: shoulder flexion: 15, abduction: 45 09/08/2021: Right shoulder flexion: 48, abduction: 53 Eval: PROM shoulder flexion: 30 degrees, Abduction: 40 degrees Goal status: Ongoing  3.  Pt. Will improve right elbow PROM by 10 degrees to prepare for hand to mouth patterns Baseline: 10/30/2021:elbow extension -72, flexion: 122 09/08/2021: elbow extension: -46, flexion: 110 Eval:   elbow extension -67, flexion 120 Goal status: Ongoing  4.  Pt. Will improve PROM right wrist extension by 10 degrees Baseline: 10/30/2021: wrist extension 25 09/08/2021: Wrist extension: 40 Eval: wrist extension 12 degrees, flexion to neutral Goal status: Ongoing  5.  Pt. Will tolerate full digit extension in the right hand to promote good skin integrity through the palmar surface of the right hand  Baseline: 10/30/2021: Pt. Tolerates ROM through 50% of  digit extension range, occasionally 75% 09/08/2021: Pt. Is able to tolerate PROM through 50% of digit extension in the digits. Eval: Pt. Maintains digits in tight flexion Goal status: Ongoing  6.  Pt. Will require minA UE dressing using one armed dressing  techniques. Baseline: 10/30/2021: Independent with button down shirt/sweater/cardigan, MaxA pullover shirt.09/08/2021: MaxA Eval: MaxA Goal status: Ongoing   ASSESSMENT:  CLINICAL IMPRESSION:  Pt. reports 3/10 pain in the right shoulder today. Pt.'s right shoulder flexion, abduction, and digit extension ROM continues to be limited by pain today.  Patient presents with increased flexor tone, stiffness, and tightness in the RUE. pt. presents with increased flexor tightness in the right 2nd, and 3rd digits. Pt. continues to initially maintain the digits on her right hand in tight flexor synergy pattern. Pt. was able to tolerate retrograde massage for edema control, and ROM to the right hand, and digits. Pt. continues to work on improving pain, and improving RUE ROM in order to improve engagement with ADLs/IADLs, and promote good skin integrity through the palmar surface of the hand.    Pt continues to benefit from OT services to work towards improving pain, ROM, and to increase engagement of the RUE during ADLs, and IADLs.    PERFORMANCE DEFICITS in functional skills including ADLs, IADLs, coordination, proprioception, sensation, edema, tone, ROM, strength, pain, FMC, GMC, balance, decreased knowledge of use of DME, skin integrity, and UE functional use, cognitive skills including attention, memory, and safety awareness, and psychosocial skills including coping strategies.   IMPAIRMENTS are limiting patient from ADLs, IADLs, education, work, leisure, and social participation.   COMORBIDITIES may have co-morbidities  that affects occupational performance. Patient will benefit from skilled OT to address above impairments and improve overall function.  MODIFICATION OR ASSISTANCE TO COMPLETE EVALUATION: Maximum modification of tasks or assist with assess necessary to complete an evaluation.  OT OCCUPATIONAL PROFILE AND HISTORY: Detailed assessment: Review of records and additional review of physical,  cognitive, psychosocial history related to current functional performance.  CLINICAL DECISION MAKING: High - multiple treatment options, significant modification of task necessary  REHAB POTENTIAL: Good for stated goals  EVALUATION COMPLEXITY: High    PLAN: OT FREQUENCY: 2x/week  OT DURATION: 12 weeks  PLANNED INTERVENTIONS: self care/ADL training, therapeutic exercise, therapeutic activity, neuromuscular re-education, manual therapy, passive range of motion, paraffin, moist heat, contrast bath, patient/family  education, cognitive remediation/compensation, energy conservation, and coping strategies training  RECOMMENDED OTHER SERVICES: PT, and ST  CONSULTED AND AGREED WITH PLAN OF CARE: Patient and family member/caregiver  PLAN FOR NEXT SESSION: Initiate PROM, and RUE Edema control techniques  Harrel Carina, MS, OTR/L   Harrel Carina, OT 11/13/2021, 5:58 PM

## 2021-11-13 NOTE — Therapy (Signed)
OUTPATIENT PHYSICAL THERAPY NEURO TREATMENT  Patient Name: Kelly Rollins MRN: [id number] DOB:11/18/1970, 51 y.o., female Today's Date: 11/13/2021   PCP: [name], PA-C REFERRING PROVIDER: [name], Bonnell Public, PA-C   PT End of Session - 11/13/21 1656     Visit Number 9    Number of Visits 25    Date for PT Re-Evaluation 12/09/21    PT Start Time 1603    PT Stop Time 1646    PT Time Calculation (min) 43 min    Equipment Utilized During Treatment Gait belt    Activity Tolerance Patient tolerated treatment well;Patient limited by fatigue    Behavior During Therapy [facility] for tasks assessed/performed                    Past Medical History:  Diagnosis Date   ABDOMINAL PAIN OTHER SPECIFIED SITE 02/21/2009   Qualifier: Diagnosis of  By: Deborra Medina MD, Talia     ABDOMINAL PAIN-LUQ 04/17/2009   Qualifier: Diagnosis of  By: Fuller Plan MD Lamont Snowball T    Anal fissure    Anginal pain ([place])    Asthma    Depression    Diabetes mellitus without complication ([place])    Dyspnea    Dysrhythmia    Headache(784.0)    HEMATOCHEZIA 04/17/2009   Qualifier: Diagnosis of  By: Fuller Plan MD Lamont Snowball T    Hyperlipidemia    Hypertension    IBS (irritable bowel syndrome)    Migraine headache 11/06/2008   Qualifier: Diagnosis of  By: Deborra Medina MD, Talia     OVARIAN CYST, RIGHT 02/21/2009   Qualifier: Diagnosis of  By: Deborra Medina MD, Talia     PALPITATIONS, OCCASIONAL 04/17/2010   Qualifier: Diagnosis of  By: Deborra Medina MD, Talia     Pancreatitis 10/14/2017   Past Surgical History:  Procedure Laterality Date   BREAST EXCISIONAL BIOPSY Left 1997   ? left side, done at time of reduction , benign   BREAST REDUCTION SURGERY     bilateral   CESAREAN SECTION     CHOLECYSTECTOMY     COLONOSCOPY     ESOPHAGOGASTRODUODENOSCOPY     ESOPHAGOGASTRODUODENOSCOPY (EGD) WITH PROPOFOL N/A 10/21/2020   Procedure: ESOPHAGOGASTRODUODENOSCOPY (EGD) WITH PROPOFOL;  Surgeon: Lesly Rubenstein, MD;   Location: [facility] ENDOSCOPY;  Service: Endoscopy;  Laterality: N/A;   REDUCTION MAMMAPLASTY Bilateral 1997   VAGINAL HYSTERECTOMY     Patient Active Problem List   Diagnosis Date Noted   Abnormal MRI, lumbar spine (01/29/2020) 02/19/2020   Anxiety due to MRI 01/25/2020   Claustrophobia associated to MRI 01/25/2020   Enthesopathy of knee region (Right) 01/25/2020   Enthesopathy of knee region (Left) 01/25/2020   Lumbar facet hypertrophy 01/25/2020   Lumbosacral radiculopathy at S1 (Left) 01/17/2020   Lumbosacral radiculopathy (S1) (Left) 01/17/2020   Chronic lower extremity pain (Bilateral) (L>R) 01/17/2020   History of pancreatitis 07/18/2019   Obstructive sleep apnea syndrome 01/02/2019   Lumbar facet arthropathy (Multilevel) (Bilateral) 12/14/2018   Osteoarthritis involving multiple joints 11/28/2018   Chronic hip pain (Left) 11/16/2018   Acute hip pain (Left) 11/16/2018   DDD (degenerative disc disease), lumbosacral 11/16/2018   Chronic hip pain (Secondary source of pain) (Bilateral) (L>R) 05/19/2017   Elevated C-reactive protein (CRP) 05/19/2017   Elevated sed rate 05/19/2017   Spondylosis without myelopathy or radiculopathy, lumbosacral region 05/19/2017   Pharmacologic therapy 05/06/2017   Disorder of skeletal system 05/06/2017   Problems influencing health status 05/06/2017   Chest pain with  moderate risk of acute coronary syndrome 04/21/2017   DOE (dyspnea on exertion) 04/21/2017   Intermittent palpitations 04/21/2017   Vitamin D insufficiency 05/04/2016   Depression 02/04/2016   Fatty liver 02/04/2016   Hypertension associated with diabetes ([place]) 02/04/2016   Chronic pain syndrome 01/15/2016   Acute low back pain 05/28/2015   Lumbar transverse process fracture (HCC) (Left L1) (03/11/15) 05/28/2015   Long term current use of opiate analgesic 05/01/2015   Long term prescription opiate use 05/01/2015   Opiate use (25 MME/Day) 05/01/2015   Encounter for therapeutic drug  level monitoring 05/01/2015   Encounter for pain management planning 05/01/2015   Chronic low back pain (Primary Source of Pain) (Bilateral) (L>R) 05/01/2015   Lumbar facet syndrome (Bilateral) (L>R) 05/01/2015   Chronic knee pain [facility] source of pain) (Bilateral) (L>R) 05/01/2015   Lumbar spondylosis 05/01/2015   Neuropathic pain 05/01/2015   Neurogenic pain 05/01/2015   Myofascial pain 05/01/2015   Essential hypertriglyceridemia 04/26/2014   Type 2 diabetes mellitus with hyperglycemia, with long-term current use of insulin ([place]) 04/26/2014   Anxiety 05/15/2010   Obesity 05/15/2010   Hyperlipidemia 04/03/2010   Fatty liver disease 04/02/2010   Pure hypercholesterolemia 03/19/2010   GERD 04/17/2009   Constipation 04/17/2009   TOBACCO ABUSE 11/06/2008    ONSET DATE: January 2023  REFERRING DIAG: C71.9 (ICD-10-CM) - Malignant neoplasm of brain, unspecified  THERAPY DIAG:  Other abnormalities of gait and mobility  Muscle weakness (generalized)  Other lack of coordination  Rationale for Evaluation and Treatment Rehabilitation  SUBJECTIVE:                                                                                                                                                                                              SUBJECTIVE STATEMENT: Pt reports she feels somewhat confused today. She reports some changes in her medicine made at recent appointment in hopes she can reduce her drowsiness.  Pt accompanied by:  Kelly Rollins  PAIN:  Are you having pain? 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 Pt's husband, Kelly Rollins, present with pt. Kelly Rollins provides majority of hx as pt has difficulty with speech due to Broca's aphasia (working with SLP). Pt reports partial paralysis of R side. She is unable to  move her RUE, RLE also affected. Pt ambulates with hemiwalker in their home for short distances and does not use WC at home. She fatigues quickly with ambulation. Pt has AFO for RLE  but unable to determine how to donn it without it hurting her LE. Pt currently with wound on R calf covered by large bandage that they have been treating for at least a month (reports physician aware). In addition to wound on R calf, pt reports R side skin irritation under breast and lateral trunk due to bra underwire. They have since removed underwire. Pt was hospitalized 10 days in June due to UTI. She has had at least 12 falls in the last six months. Reports no injuries with falls, and states "just bumps and bruises." Pt reports she has constant head pain. Pt spouse reports pt has pain throughout her whole right side down to LE. Can have bad cramps in RUE and RLE. Reports no numbness/tingling. Occ dizziness, unable to describe further. Pt sleeps in recliner, wakes up stiff in the morning. PMH per chart includes: Anorexia, dehydration, Adult Failure to thrive, contracture of muscles multiple sites. Pt./caregiver reports recently being hospitalized with a UTI, and dehydration. Extensive hx, please refer to chart for full details.   PAIN: from eval Are you having pain? Yes: NPRS scale: 3/10 Pain location: Top of head, front Pain description: States, "cancer pain" head pain Aggravating factors: unsure Relieving factors: medications  PRECAUTIONS: Fall  WEIGHT BEARING RESTRICTIONS No  FALLS: Has patient fallen in last 6 months? Yes. Number of falls 12  LIVING ENVIRONMENT: information primarily from chart Lives with: lives with their spouse Lives in: House/apartment Stairs: Yes: External: 4 steps; bilateral but cannot reach both Has following equipment at home: Single point cane, Walker - 4 wheeled, Hemi walker, shower chair, bed side commode, Grab bars, and lift chair  PLOF: Independent  PATIENT GOALS  "Normal  function" as much as possible. Improving her balance.  OBJECTIVE:   DIAGNOSTIC FINDINGS:   MR BRAIN W WO CONTRAST 02/06/2021:  "IMPRESSION: Masslike T2 signal in the anterior left frontal lobe, without associated enhancement, most concerning for a low-grade glioma. Additional areas of increased T2 signal in the left frontal lobe may also infiltrative low-grade glioma or associated edema."  CT HEAD WO CONTRAST (5MM) 02/06/2021: "IMPRESSION: High left anterior frontal edema with new adjacent masslike hyperdensity involving the left frontal white matter, high left left frontal cortex, and genu of corpus callosum. Overall, findings are suspicious for malignancy (including hypercellular tumors such as GBM and lymphoma). Acute or subacute left ACA territory infarct is a differential consideration, but thought less likely. Recommend MRI with contrast for further evaluation."   COGNITION: Overall cognitive status: Within functional limits for tasks assessed   SENSATION: Impaired in RLE - diminished and absent throughout R ankle/foot - noted wound on calf - LLE intact to light touch   COORDINATION:  Finger to chin with LUE - few errors otherwise able to complete. Unable to perform RUE.  EDEMA: noted B ankles   MUSCLE TONE: RLE: Mild-moderate. Pt unable to get R heel on floor in weightbearing position, plantarflexed  LOWER EXTREMITY MMT:     LLE grossly 4/5 throughout Trace activation RLE in chair with majority of muscles tested. RLE generally <3/5. Exception R hip abductors 3-/5, would benefit from further assessment in supine.    TRANSFERS: Assistive device utilized: Hemi walker  Sit to stand:  Attempted 5xSTS in session, unable to complete but did perform 2x in session with mod a.  Spouse reports pt able to perform transfers with 20% help at home from chair of increased height Stand to sit: Mod Ax2   GAIT: Gait pattern: step to pattern,  RLE fixed in PF, externally rotated,  uses L hemiwalker Distance walked: 20 ft Assistive device utilized: Hemi walker Level of assistance:  close CGA Comments: heavy LUE weightbearing on HW  FUNCTIONAL TESTs:  5 times sit to stand: unable to complete from standard chair height, performs 1 STS  PATIENT SURVEYS:  FOTO unable to complete due to error indicating FOTO not available in system  TODAY'S TREATMENT:   UBE Bike x 6 min low intensity LUE only - PT adjusts intensity throughout and monitors pt for exercise response  Seated thoracic ext over chair with 5 sec hold x 10  Trialed pillow placement on L side of chair to address pelvic obliquity in chair. Minimal improvement, pt needs cushioning to address and improve pelvic obliquity, trunk lean, and general posture in chair. PT recommends wheelchair evaluation so pt can receive a more appropriate wheelchair. Discussed this with pt and her spouse.   Seated LAQ 10x each LE with external cue, mirror cue Seated March 10x each LE with external cue, mirror cue  Manual - pt seated in chair STM focus to R UT and posterior shoulder musculature to assist in tissue extensibility for stretching, and reduce development of contracture. Pt reports intervention feels good and as though it relieves pain and tension felt in R shoulder.  R side UT stretch to pt tolerance x long duration, performed to prevent contracture formation. Pt generally in R cervical flexion, tissue still somewhat flexible.    PATIENT EDUCATION: Education details: Pt educated throughout session about proper posture and technique with exercises. Improved exercise technique, movement at target joints, use of target muscles after min to mod verbal, visual, tactile cues.  Person educated: pt and her spouse Education method:Explanation, Demonstration, Tactile cues, and Verbal cues Education comprehension: verbalized understanding, returned demonstration, tactile cues required, and needs further education   HOME EXERCISE  PROGRAM: No updates on this date, pt to continue HEP as previously given Access Code: [email or web address] URL: [email or web address] Date: 10/02/2021 Prepared by: Sande Brothers  Exercises - Seated Hip Flexion March with Ankle Weights  - 1 x daily - 7 x weekly - 3 sets - 10 reps - Seated Long Arc Quad  - 1 x daily - 7 x weekly - 3 sets - 10 reps - Supine Quad Set  - 1 x daily - 7 x weekly - 3 sets - 10 reps - Proper Sit to Stand Technique  - 1 x daily - 7 x weekly - 3 sets - 10 reps  GOALS: Goals reviewed with patient? Yes  SHORT TERM GOALS: Target date: 10/28/2021  Patient will be independent in home exercise program to improve strength/mobility for better functional independence with ADLs. Baseline: to be initiated next 1-2 sessions Goal status: INITIAL   LONG TERM GOALS: Target date: 12/09/2021  Patient will increase FOTO score to equal to or greater than     to demonstrate statistically significant improvement in mobility and quality of life.  Baseline: to be initiated next 1-2 visits Goal status: INITIAL  2.   Patient (< 28 years old) will be able to perform and complete five times sit to stand test from standard chair height in <30 seconds to indicate increased LE strength and improved balance. Baseline: Pt able to complete only 1 stand, insufficient strength currently to complete full test Goal status: INITIAL  3.  Pt will improve LLE strength by at least 1/2 point on MMT in order to increase ease with transfers and functional mobility. Baseline: LLE grossly 4/5  throughout Goal status: INITIAL  4.  Patient will increase 10 meter walk test to at least 0.80 m/s as to improve gait speed for better community ambulation and to reduce fall risk. Baseline: to be tested next 1-2 visits Goal status: INITIAL  5.  Pt will complete STS from standard height chair with BUE assist off armrests and with no greater than min assist to improve ease and safety with  transfers. Baseline: mod a x2 Goal status: INITIAL   ASSESSMENT:  CLINICAL IMPRESSION: Interventions somewhat regressed as pt feeling very fatigued today. PT initiated manual therapy as adjunct to stretching to assist with postural deficits in chair. PT did attempt to correct pt posture with placement of pillow at L side for trunk support. However, this did little to correct this impairment, likely due to pelvic obliquity from cushioning. Pt would benefit from wheelchair evaluation for more appropriate seating to improve posture, prevent contracture formation and reduce likelihood of wound development and correct pelvic obliquity The pt would benefit from continued skilled PT to address immobility, LE muscle weakness, and pain in order to decrease fall risk and increase functional mobility and QOL.   OBJECTIVE IMPAIRMENTS Abnormal gait, decreased activity tolerance, decreased balance, decreased coordination, decreased endurance, decreased mobility, difficulty walking, decreased ROM, decreased strength, hypomobility, increased edema, increased fascial restrictions, increased muscle spasms, impaired flexibility, impaired sensation, impaired tone, impaired UE functional use, improper body mechanics, postural dysfunction, and pain.   ACTIVITY LIMITATIONS carrying, lifting, bending, sitting, standing, squatting, stairs, transfers, bed mobility, bathing, toileting, dressing, reach over head, hygiene/grooming, locomotion level, and caring for others  PARTICIPATION LIMITATIONS: meal prep, cleaning, laundry, medication management, personal finances, driving, shopping, community activity, occupation, and yard work  PERSONAL Education officer, community, Past/current experiences, Sex, and 3+ comorbidities: Anorexia, dehydration, Adult Failure to thrive, contracture of muscles multiple sites, multiple falls, UTI, extensive PMH refer to chart  are also affecting patient's functional outcome.   REHAB POTENTIAL: Fair     CLINICAL DECISION MAKING: Evolving/moderate complexity  EVALUATION COMPLEXITY: High  PLAN: PT FREQUENCY: 2x/week  PT DURATION: 12 weeks  PLANNED INTERVENTIONS: Therapeutic exercises, Therapeutic activity, Neuromuscular re-education, Balance training, Gait training, Patient/Family education, Self Care, Joint mobilization, Joint manipulation, Stair training, Vestibular training, Canalith repositioning, Orthotic/Fit training, DME instructions, Wheelchair mobility training, Spinal mobilization, Cryotherapy, Moist heat, scar mobilization, Splintting, Taping, Manual therapy, and Re-evaluation  PLAN FOR NEXT SESSION:  stretching, strengthening, gait, balance, continue plan   Zollie Pee, PT 11/13/2021, 4:59 PM

## 2021-11-18 ENCOUNTER — Ambulatory Visit: Payer: Medicare Other

## 2021-11-18 ENCOUNTER — Ambulatory Visit: Payer: Medicare Other | Admitting: Occupational Therapy

## 2021-11-18 ENCOUNTER — Encounter: Payer: Medicare Other | Admitting: Speech Pathology

## 2021-11-18 DIAGNOSIS — M6281 Muscle weakness (generalized): Secondary | ICD-10-CM

## 2021-11-18 DIAGNOSIS — R262 Difficulty in walking, not elsewhere classified: Secondary | ICD-10-CM

## 2021-11-18 DIAGNOSIS — R278 Other lack of coordination: Secondary | ICD-10-CM

## 2021-11-18 DIAGNOSIS — R2681 Unsteadiness on feet: Secondary | ICD-10-CM

## 2021-11-18 NOTE — Therapy (Addendum)
Occupational Therapy Progress Note  Dates of reporting period  09/08/2021   to   11/18/2021   Patient Name: Kelly Rollins MRN: [id number] DOB:[birth year], 51 y.o., female 40 Date: 11/18/2021  PCP: [name], PA-C REFERRING PROVIDER: [name], Corrine Jeani Hawking, PA-C    OT End of Session - 11/18/21 1309     Visit Number 20    Number of Visits 92    Date for OT Re-Evaluation 01/22/22    Authorization Time Period Progress reporting period starting 09/08/2021    OT Start Time 1300    OT Stop Time 1345    OT Time Calculation (min) 45 min    Activity Tolerance Patient tolerated treatment well    Behavior During Therapy [facility] for tasks assessed/performed                    Past Medical History:  Diagnosis Date   ABDOMINAL PAIN OTHER SPECIFIED SITE 02/21/2009   Qualifier: Diagnosis of  By: Deborra Medina MD, Talia     ABDOMINAL PAIN-LUQ 04/17/2009   Qualifier: Diagnosis of  By: Fuller Plan MD Lamont Snowball T    Anal fissure    Anginal pain ([place])    Asthma    Depression    Diabetes mellitus without complication ([place])    Dyspnea    Dysrhythmia    Headache(784.0)    HEMATOCHEZIA 04/17/2009   Qualifier: Diagnosis of  By: Fuller Plan MD Lamont Snowball T    Hyperlipidemia    Hypertension    IBS (irritable bowel syndrome)    Migraine headache 11/06/2008   Qualifier: Diagnosis of  By: Deborra Medina MD, Talia     OVARIAN CYST, RIGHT 02/21/2009   Qualifier: Diagnosis of  By: Deborra Medina MD, Talia     PALPITATIONS, OCCASIONAL 04/17/2010   Qualifier: Diagnosis of  By: Deborra Medina MD, Talia     Pancreatitis 10/14/2017   Past Surgical History:  Procedure Laterality Date   BREAST EXCISIONAL BIOPSY Left 1997   ? left side, done at time of reduction , benign   BREAST REDUCTION SURGERY     bilateral   CESAREAN SECTION     CHOLECYSTECTOMY     COLONOSCOPY     ESOPHAGOGASTRODUODENOSCOPY     ESOPHAGOGASTRODUODENOSCOPY (EGD) WITH PROPOFOL N/A 10/21/2020   Procedure: ESOPHAGOGASTRODUODENOSCOPY (EGD) WITH  PROPOFOL;  Surgeon: Lesly Rubenstein, MD;  Location: [facility] ENDOSCOPY;  Service: Endoscopy;  Laterality: N/A;   REDUCTION MAMMAPLASTY Bilateral 1997   VAGINAL HYSTERECTOMY     Patient Active Problem List   Diagnosis Date Noted   Abnormal MRI, lumbar spine (01/29/2020) 02/19/2020   Anxiety due to MRI 01/25/2020   Claustrophobia associated to MRI 01/25/2020   Enthesopathy of knee region (Right) 01/25/2020   Enthesopathy of knee region (Left) 01/25/2020   Lumbar facet hypertrophy 01/25/2020   Lumbosacral radiculopathy at S1 (Left) 01/17/2020   Lumbosacral radiculopathy (S1) (Left) 01/17/2020   Chronic lower extremity pain (Bilateral) (L>R) 01/17/2020   History of pancreatitis 07/18/2019   Obstructive sleep apnea syndrome 01/02/2019   Lumbar facet arthropathy (Multilevel) (Bilateral) 12/14/2018   Osteoarthritis involving multiple joints 11/28/2018   Chronic hip pain (Left) 11/16/2018   Acute hip pain (Left) 11/16/2018   DDD (degenerative disc disease), lumbosacral 11/16/2018   Chronic hip pain (Secondary source of pain) (Bilateral) (L>R) 05/19/2017   Elevated C-reactive protein (CRP) 05/19/2017   Elevated sed rate 05/19/2017   Spondylosis without myelopathy or radiculopathy, lumbosacral region 05/19/2017   Pharmacologic therapy 05/06/2017   Disorder of skeletal system 05/06/2017  Problems influencing health status 05/06/2017   Chest pain with moderate risk of acute coronary syndrome 04/21/2017   DOE (dyspnea on exertion) 04/21/2017   Intermittent palpitations 04/21/2017   Vitamin D insufficiency 05/04/2016   Depression 02/04/2016   Fatty liver 02/04/2016   Hypertension associated with diabetes ([place]) 02/04/2016   Chronic pain syndrome 01/15/2016   Acute low back pain 05/28/2015   Lumbar transverse process fracture (HCC) (Left L1) (03/11/15) 05/28/2015   Long term current use of opiate analgesic 05/01/2015   Long term prescription opiate use 05/01/2015   Opiate use (25 MME/Day)  05/01/2015   Encounter for therapeutic drug level monitoring 05/01/2015   Encounter for pain management planning 05/01/2015   Chronic low back pain (Primary Source of Pain) (Bilateral) (L>R) 05/01/2015   Lumbar facet syndrome (Bilateral) (L>R) 05/01/2015   Chronic knee pain [facility] source of pain) (Bilateral) (L>R) 05/01/2015   Lumbar spondylosis 05/01/2015   Neuropathic pain 05/01/2015   Neurogenic pain 05/01/2015   Myofascial pain 05/01/2015   Essential hypertriglyceridemia 04/26/2014   Type 2 diabetes mellitus with hyperglycemia, with long-term current use of insulin ([place]) 04/26/2014   Anxiety 05/15/2010   Obesity 05/15/2010   Hyperlipidemia 04/03/2010   Fatty liver disease 04/02/2010   Pure hypercholesterolemia 03/19/2010   GERD 04/17/2009   Constipation 04/17/2009   TOBACCO ABUSE 11/06/2008    ONSET DATE: 02/06/2021  REFERRING DIAG: Astrocytoma  THERAPY DIAG:  No diagnosis found.  Rationale for Evaluation and Treatment Rehabilitation  SUBJECTIVE:     SUBJECTIVE STATEMENT:  Pt. Reports that she has an infusion next Thursday, so she may not be able to attend therapy.  Pt accompanied by: significant other  PERTINENT HISTORY:  Pt. Is a 51 y.o. 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 PMHx includes: Anorexia, dehydration, Adult Failure to thrive, contracture of muscles multiple sites. Pt./caregiver reports recently being hospitalized with a UTI, and dehydration.   PRECAUTIONS: Fall  WEIGHT BEARING RESTRICTIONS No  PAIN:  Are you having pain?  Pt. reports 3/10 pain in the RUE.   FALLS: Has patient fallen in last 6 months? Yes. Number of falls Multiple Falls  LIVING ENVIRONMENT: Lives with: Family Lives in: House/apartment Stairs: Yes: External: 4 steps; bilateral but cannot reach both Has following equipment at home: Single point cane, Walker - 4 wheeled, Hemi walker,  shower chair, bed side commode, and Grab bars Lift chair in the tub, [place],  PLOF: Independent  PATIENT GOALS: To improve the right UE  OBJECTIVE:   HAND DOMINANCE: Right  ADLs: Transfers/ambulation related to ADLs: Eating: MaxA with cutting food, setting up, Modified I with nondominant Left hand, [place]  Grooming: Independent  UB Dressing: MaxA  LB Dressing: MaxA Toileting: Uses a BSCommode mInA transfers, MaxA clothing negotiation Bathing: MaxA Tub Shower transfers:  Uses a shower lift chair Equipment:  w/c, BSCommode, cane   IADLs: Shopping: Husband performs Light housekeeping: Max Meal Prep: MaxA Community mobility: Relies on family Medication management:Husband assists Doctor, hospital: Performs together Handwriting: Not legible  MOBILITY STATUS: Hx of falls   ACTIVITY TOLERANCE: Activity tolerance: limited  FUNCTIONAL OUTCOME MEASURES: FOTO: 11.76  UPPER EXTREMITY ROM  : WFL Left AROM/PROM   Passive ROM Right eval Right  08/18/21 Right 09/08/2021 Right 10/30/2021 Right 11/18/2021  Shoulder flexion 30   48 15 36  Shoulder abduction 40 62 53 45 58  Shoulder adduction  Shoulder extension          Shoulder internal rotation          Shoulder external rotation          Elbow flexion 120   110 122 110  Elbow extension -67   -46 -72 -45  Wrist flexion Neutral   Neutral Neutral Neutral  Wrist extension 12   40 25 50  Wrist ulnar deviation          Wrist radial deviation          Wrist pronation          Wrist supination          (Blank rows = not tested)     UPPER EXTREMITY MMT:      MMT Right eval Left eval  Shoulder flexion 0/5 4+/5  Shoulder abduction 0/5 4+/5  Shoulder adduction      Shoulder extension      Shoulder internal rotation      Shoulder external rotation      Middle trapezius      Lower trapezius      Elbow flexion 0/5 4+/5  Elbow extension 0/5 4+/5  Wrist flexion      Wrist extension 0/5 4+/5  Wrist ulnar  deviation      Wrist radial deviation      Wrist pronation      Wrist supination      (Blank rows = not tested)   FOTO score: 09/08/2021: 11   SENSATION: Light touch: WFL  EDEMA:   Wrist: 18.5 cm MCP: 21.5 cm  10/30/2021:  Wrist: 17.5 cm MCPs: 20cm    MUSCLE TONE: RUE: Severe  COGNITION: Overall cognitive status: Impaired  VISION: Subjective report: Wears glasses Baseline vision: Wears glasses all the time Pt. Reports no changes    PRAXIS: Impaired: Motor planning  TODAY'S TREATMENT:   Pt. tolerated PROM for  shoulder flexion, and abduction. AAROM/PROM for shoulder flexion at the pillow type moving the arm out to the edge of the pillow and back.  Patient tolerated right wrist flexion and extension, radial and ulnar deviation, forearm supination/pronation, digit flexion and extension, and thumb abduction passive range of motion.  Pt./caregiver education was provided about positioning to increase progressive passive stretching of right shoulder abduction with pillows.   Pt. reports 3/10 pain in the right shoulder today. Pt.'s right shoulder flexion, abduction, and digit extension ROM continues to be limited by pain today. Patient presents with increased flexor tone, stiffness, and tightness in the RUE. Pt. presents with increased flexor tightness in the right 2nd, and 3rd digits. Pt. continues to initially maintain the digits on her right hand in tight flexor synergy pattern. Pt. was able to tolerate retrograde massage for edema control, and ROM to the right hand, and digits. Pt. continues to work on improving pain, and improving RUE ROM in order to improve engagement with ADLs/IADLs, and promote good skin integrity through the palmar surface of the hand.  PATIENT EDUCATION: Education details: Right UE ROM, positioning of the RUE Person educated: Patient and Spouse Education method: Explanation, Demonstration, Tactile cues, and Verbal cues Education comprehension: verbalized  understanding, returned demonstration, and needs further education   HOME EXERCISE PROGRAM:   Pt./caregiver education about positioning of the RUE, ROM    GOALS: Goals reviewed with patient? Yes  SHORT TERM GOALS: Target date: 12/11/2021      Pt./caregivers will demonstrate independence with HEPs for UE ROM, and edema control Baseline:  11/18/2021:  Edema has improved. 10/30/2021: Pt. Edema has improved. Pt. Requires caregiver assist for positioning. 09/08/2021: Pt. Requires assist for positioning, ROM, edema control. Eval: No current HEPs  Goal status: Ongoing    LONG TERM GOALS: Target date: 01/22/2022    Pt. Will improve right hand edema by 1 cm at the wrist, and MCPs Baseline: 11/18/2021: Edema has improved overall  10/30/2021:  wrist: 17.5 cm, MCPs: 20 cm 09/08/2021: Wrist: 17.5 cm, MCPs: 20.5 cm Eval: Wrist: 18.5cm, MCPs: 21.5cm Goal status: Ongoing  2.  Pt. Will improve right shoulder PROM by 10 degrees with ADLs Baseline: 11/18/2021: shoulder flexion: 36, abduction:  58  10/30/2021: shoulder flexion: 15, abduction: 45 09/08/2021: Right shoulder flexion: 48, abduction: 53 Eval: PROM shoulder flexion: 30 degrees, Abduction: 40 degrees Goal status: Ongoing  3.  Pt. Will improve right elbow PROM by 10 degrees to prepare for hand to mouth patterns Baseline: 11/18/2021: -45 , flexion: 110 10/30/2021:elbow extension -72, flexion: 122 09/08/2021: elbow extension: -46, flexion: 110 Eval:   elbow extension -67, flexion 120 Goal status: Ongoing  4.  Pt. Will improve PROM right wrist extension by 10 degrees Baseline: 11/18/2021: wrist extension 50 10/30/2021: wrist extension 25 09/08/2021: Wrist extension: 40 Eval: wrist extension 12 degrees, flexion to neutral Goal status: Ongoing  5.  Pt. Will tolerate full digit extension in the right hand to promote good skin integrity through the palmar surface of the right hand.  Baseline: 11/18/2021: Pt. Keeps her right hand in tight flexor synergy.  Pt. Intermittently tolerates  digit extension through 50% of the ROM. 10/30/2021: Pt. Tolerates ROM through 50% of  digit extension range, occasionally 75% 09/08/2021: Pt. Is able to tolerate PROM through 50% of digit extension in the digits. Eval: Pt. Maintains digits in tight flexion Goal status: Ongoing  6.  Pt. Will require minA UE dressing using one armed dressing techniques. Baseline: 11/18/2021: MinA-ModA UE dressing.  10/30/2021: Independent with button down shirt/sweater/cardigan, MaxA pullover shirt.09/08/2021: MaxA Eval: MaxA Goal status: Ongoing   ASSESSMENT:  CLINICAL IMPRESSION:  Pt. reports 3/10 pain in the right shoulder today. Pt.'s right shoulder flexion, abduction, and digit extension ROM continues to be limited by pain today. Patient presents with increased flexor tone, stiffness, and tightness in the RUE. Pt. presents with increased flexor tightness in the right 2nd, and 3rd digits. Pt. continues to initially maintain the digits on her right hand in tight flexor synergy pattern.  FOTO score: 11. Pt. was able to tolerate retrograde massage for edema control, and ROM to the right hand, and digits. Pt. continues to work on improving pain, and improving RUE ROM in order to improve engagement with ADLs/IADLs, and promote good skin integrity through the palmar surface of the hand.    PERFORMANCE DEFICITS in functional skills including ADLs, IADLs, coordination, proprioception, sensation, edema, tone, ROM, strength, pain, FMC, GMC, balance, decreased knowledge of use of DME, skin integrity, and UE functional use, cognitive skills including attention, memory, and safety awareness, and psychosocial skills including coping strategies.   IMPAIRMENTS are limiting patient from ADLs, IADLs, education, work, leisure, and social participation.   COMORBIDITIES may have co-morbidities  that affects occupational performance. Patient will benefit from skilled OT to address above impairments and  improve overall function.  MODIFICATION OR ASSISTANCE TO COMPLETE EVALUATION: Maximum modification of tasks or assist with assess necessary to complete an evaluation.  OT OCCUPATIONAL PROFILE AND HISTORY: Detailed assessment: Review of records and additional review of physical, cognitive, psychosocial history related to current functional performance.  CLINICAL DECISION MAKING: High - multiple treatment options, significant modification of task necessary  REHAB POTENTIAL: Good for stated goals  EVALUATION COMPLEXITY: High    PLAN: OT FREQUENCY: 2x/week  OT DURATION: 12 weeks  PLANNED INTERVENTIONS: self care/ADL training, therapeutic exercise, therapeutic activity, neuromuscular re-education, manual therapy, passive range of motion, paraffin, moist heat, contrast bath, patient/family education, cognitive remediation/compensation, energy conservation, and coping strategies training  RECOMMENDED OTHER SERVICES: PT, and ST  CONSULTED AND AGREED WITH PLAN OF CARE: Patient and family member/caregiver  PLAN FOR NEXT SESSION: Initiate PROM, and RUE Edema control techniques    Harrel Carina, MS, OTR/L   Harrel Carina, OT 11/18/2021, 1:13 PM

## 2021-11-18 NOTE — Therapy (Unsigned)
OUTPATIENT PHYSICAL THERAPY NEURO TREATMENT/Physical Therapy Progress Note   Dates of reporting period  09/16/2021   to   11/18/2021   Patient Name: Kelly Rollins MRN: [id number] DOB:1970-06-26, 51 y.o., female Today's Date: 11/19/2021   PCP: [name], PA-C REFERRING PROVIDER: [name], Bonnell Public, PA-C   PT End of Session - 11/18/21 1339     Visit Number 10    Number of Visits 25    Date for PT Re-Evaluation 12/09/21    PT Start Time 1859    PT Stop Time 1430    PT Time Calculation (min) 38 min    Equipment Utilized During Treatment Gait belt    Activity Tolerance Patient tolerated treatment well;Patient limited by fatigue    Behavior During Therapy [facility] for tasks assessed/performed                     Past Medical History:  Diagnosis Date   ABDOMINAL PAIN OTHER SPECIFIED SITE 02/21/2009   Qualifier: Diagnosis of  By: Deborra Medina MD, Talia     ABDOMINAL PAIN-LUQ 04/17/2009   Qualifier: Diagnosis of  By: Fuller Plan MD Lamont Snowball T    Anal fissure    Anginal pain ([place])    Asthma    Depression    Diabetes mellitus without complication ([place])    Dyspnea    Dysrhythmia    Headache(784.0)    HEMATOCHEZIA 04/17/2009   Qualifier: Diagnosis of  By: Fuller Plan MD Lamont Snowball T    Hyperlipidemia    Hypertension    IBS (irritable bowel syndrome)    Migraine headache 11/06/2008   Qualifier: Diagnosis of  By: Deborra Medina MD, Talia     OVARIAN CYST, RIGHT 02/21/2009   Qualifier: Diagnosis of  By: Deborra Medina MD, Talia     PALPITATIONS, OCCASIONAL 04/17/2010   Qualifier: Diagnosis of  By: Deborra Medina MD, Talia     Pancreatitis 10/14/2017   Past Surgical History:  Procedure Laterality Date   BREAST EXCISIONAL BIOPSY Left 1997   ? left side, done at time of reduction , benign   BREAST REDUCTION SURGERY     bilateral   CESAREAN SECTION     CHOLECYSTECTOMY     COLONOSCOPY     ESOPHAGOGASTRODUODENOSCOPY     ESOPHAGOGASTRODUODENOSCOPY (EGD) WITH PROPOFOL N/A 10/21/2020    Procedure: ESOPHAGOGASTRODUODENOSCOPY (EGD) WITH PROPOFOL;  Surgeon: Lesly Rubenstein, MD;  Location: [facility] ENDOSCOPY;  Service: Endoscopy;  Laterality: N/A;   REDUCTION MAMMAPLASTY Bilateral 1997   VAGINAL HYSTERECTOMY     Patient Active Problem List   Diagnosis Date Noted   Abnormal MRI, lumbar spine (01/29/2020) 02/19/2020   Anxiety due to MRI 01/25/2020   Claustrophobia associated to MRI 01/25/2020   Enthesopathy of knee region (Right) 01/25/2020   Enthesopathy of knee region (Left) 01/25/2020   Lumbar facet hypertrophy 01/25/2020   Lumbosacral radiculopathy at S1 (Left) 01/17/2020   Lumbosacral radiculopathy (S1) (Left) 01/17/2020   Chronic lower extremity pain (Bilateral) (L>R) 01/17/2020   History of pancreatitis 07/18/2019   Obstructive sleep apnea syndrome 01/02/2019   Lumbar facet arthropathy (Multilevel) (Bilateral) 12/14/2018   Osteoarthritis involving multiple joints 11/28/2018   Chronic hip pain (Left) 11/16/2018   Acute hip pain (Left) 11/16/2018   DDD (degenerative disc disease), lumbosacral 11/16/2018   Chronic hip pain (Secondary source of pain) (Bilateral) (L>R) 05/19/2017   Elevated C-reactive protein (CRP) 05/19/2017   Elevated sed rate 05/19/2017   Spondylosis without myelopathy or radiculopathy, lumbosacral region 05/19/2017   Pharmacologic therapy 05/06/2017  Disorder of skeletal system 05/06/2017   Problems influencing health status 05/06/2017   Chest pain with moderate risk of acute coronary syndrome 04/21/2017   DOE (dyspnea on exertion) 04/21/2017   Intermittent palpitations 04/21/2017   Vitamin D insufficiency 05/04/2016   Depression 02/04/2016   Fatty liver 02/04/2016   Hypertension associated with diabetes ([place]) 02/04/2016   Chronic pain syndrome 01/15/2016   Acute low back pain 05/28/2015   Lumbar transverse process fracture (HCC) (Left L1) (03/11/15) 05/28/2015   Long term current use of opiate analgesic 05/01/2015   Long term prescription  opiate use 05/01/2015   Opiate use (25 MME/Day) 05/01/2015   Encounter for therapeutic drug level monitoring 05/01/2015   Encounter for pain management planning 05/01/2015   Chronic low back pain (Primary Source of Pain) (Bilateral) (L>R) 05/01/2015   Lumbar facet syndrome (Bilateral) (L>R) 05/01/2015   Chronic knee pain [facility] source of pain) (Bilateral) (L>R) 05/01/2015   Lumbar spondylosis 05/01/2015   Neuropathic pain 05/01/2015   Neurogenic pain 05/01/2015   Myofascial pain 05/01/2015   Essential hypertriglyceridemia 04/26/2014   Type 2 diabetes mellitus with hyperglycemia, with long-term current use of insulin ([place]) 04/26/2014   Anxiety 05/15/2010   Obesity 05/15/2010   Hyperlipidemia 04/03/2010   Fatty liver disease 04/02/2010   Pure hypercholesterolemia 03/19/2010   GERD 04/17/2009   Constipation 04/17/2009   TOBACCO ABUSE 11/06/2008    ONSET DATE: January 2023  REFERRING DIAG: C71.9 (ICD-10-CM) - Malignant neoplasm of brain, unspecified  THERAPY DIAG:  Muscle weakness (generalized)  Other lack of coordination  Difficulty in walking, not elsewhere classified  Unsteadiness on feet  Rationale for Evaluation and Treatment Rehabilitation  SUBJECTIVE:                                                                                                                                                                                              SUBJECTIVE STATEMENT: Pt is exhausted currently. Has some difficulty answering questions due to fatigue. Spouse helps her. Pt accompanied by:  Kelly Rollins  PAIN:  Are you having pain? 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 Pt's husband, Kelly Rollins, present with pt. Kelly Rollins provides majority of hx as pt has difficulty with speech due to Broca's aphasia (working  with SLP). Pt reports partial paralysis of R side. She is unable to move her RUE, RLE also affected. Pt ambulates with hemiwalker in their home for short distances and does not use WC at home.  She fatigues quickly with ambulation. Pt has AFO for RLE but unable to determine how to donn it without it hurting her LE. Pt currently with wound on R calf covered by large bandage that they have been treating for at least a month (reports physician aware). In addition to wound on R calf, pt reports R side skin irritation under breast and lateral trunk due to bra underwire. They have since removed underwire. Pt was hospitalized 10 days in June due to UTI. She has had at least 12 falls in the last six months. Reports no injuries with falls, and states "just bumps and bruises." Pt reports she has constant head pain. Pt spouse reports pt has pain throughout her whole right side down to LE. Can have bad cramps in RUE and RLE. Reports no numbness/tingling. Occ dizziness, unable to describe further. Pt sleeps in recliner, wakes up stiff in the morning. PMH per chart includes: Anorexia, dehydration, Adult Failure to thrive, contracture of muscles multiple sites. Pt./caregiver reports recently being hospitalized with a UTI, and dehydration. Extensive hx, please refer to chart for full details.   PAIN: from eval Are you having pain? Yes: NPRS scale: 3/10 Pain location: Top of head, front Pain description: States, "cancer pain" head pain Aggravating factors: unsure Relieving factors: medications  PRECAUTIONS: Fall  WEIGHT BEARING RESTRICTIONS No  FALLS: Has patient fallen in last 6 months? Yes. Number of falls 12  LIVING ENVIRONMENT: information primarily from chart Lives with: lives with their spouse Lives in: House/apartment Stairs: Yes: External: 4 steps; bilateral but cannot reach both Has following equipment at home: Single point cane, Walker - 4 wheeled, Hemi walker, shower chair, bed side commode, Grab bars,  and lift chair  PLOF: Independent  PATIENT GOALS  "Normal function" as much as possible. Improving her balance.  OBJECTIVE:   DIAGNOSTIC FINDINGS:   MR BRAIN W WO CONTRAST 02/06/2021:  "IMPRESSION: Masslike T2 signal in the anterior left frontal lobe, without associated enhancement, most concerning for a low-grade glioma. Additional areas of increased T2 signal in the left frontal lobe may also infiltrative low-grade glioma or associated edema."  CT HEAD WO CONTRAST (5MM) 02/06/2021: "IMPRESSION: High left anterior frontal edema with new adjacent masslike hyperdensity involving the left frontal white matter, high left left frontal cortex, and genu of corpus callosum. Overall, findings are suspicious for malignancy (including hypercellular tumors such as GBM and lymphoma). Acute or subacute left ACA territory infarct is a differential consideration, but thought less likely. Recommend MRI with contrast for further evaluation."   COGNITION: Overall cognitive status: Within functional limits for tasks assessed   SENSATION: Impaired in RLE - diminished and absent throughout R ankle/foot - noted wound on calf - LLE intact to light touch   COORDINATION:  Finger to chin with LUE - few errors otherwise able to complete. Unable to perform RUE.  EDEMA: noted B ankles   MUSCLE TONE: RLE: Mild-moderate. Pt unable to get R heel on floor in weightbearing position, plantarflexed  LOWER EXTREMITY MMT:     LLE grossly 4/5 throughout Trace activation RLE in chair with majority of muscles tested. RLE generally <3/5. Exception R hip abductors 3-/5, would benefit from further assessment in supine.    TRANSFERS: Assistive device utilized: Hemi walker  Sit to stand:  Attempted 5xSTS in session, unable to complete but did perform 2x in session with mod a.  Spouse reports pt able to perform transfers with 20% help at home from chair of increased height Stand to sit:  Mod Ax2   GAIT: Gait  pattern: step to pattern, RLE fixed in PF, externally rotated, uses L hemiwalker Distance walked: 20 ft Assistive device utilized: Hemi walker Level of assistance:  close CGA Comments: heavy LUE weightbearing on HW  FUNCTIONAL TESTs:  5 times sit to stand: unable to complete from standard chair height, performs 1 STS  PATIENT SURVEYS:  FOTO unable to complete due to error indicating FOTO not available in system  TODAY'S TREATMENT:     TherAct-  Goal retesting completed today for progress note. Please refer to goal section below for details.   TherEx-  Amb in // bars 2x2 length of bars ambulating FWD/BCKWD with close CGA - with rest break between sets.   Tricep ext. In [place] with YTB and RTB 10 reps with  each band LUE   Seated adductor squeezes 2x10 BLE, minim activation RLE, provides TC to assist   Added tricep ext. To HEP to promote ability to perform STS by pushing from [place]: Education details: Pt educated throughout session about proper posture and technique with exercises. Improved exercise technique, movement at target joints, use of target muscles after min to mod verbal, visual, tactile cues. Goals, HEP, plan  Person educated: pt and her spouse Education method:Explanation, Demonstration, Tactile cues, and Verbal cues, printout of HEP Education comprehension: verbalized understanding, returned demonstration, tactile cues required, and needs further education   HOME EXERCISE PROGRAM:   10/17:   Access Code: [email or web address] URL: [email or web address] Date: 11/18/2021 Prepared by: Ricard Dillon  Exercises - Elbow Extension with Anchored Resistance  - 1 x daily - 5 x weekly - 3 sets - 10 reps  Access Code: [email or web address] URL: [email or web address] Date: 10/02/2021 Prepared by: Sande Brothers  Exercises - Seated Hip Flexion March with Ankle Weights  - 1 x daily - 7 x weekly - 3 sets - 10 reps - Seated Long Arc Quad  - 1 x daily -  7 x weekly - 3 sets - 10 reps - Supine Quad Set  - 1 x daily - 7 x weekly - 3 sets - 10 reps - Proper Sit to Stand Technique  - 1 x daily - 7 x weekly - 3 sets - 10 reps  GOALS: Goals reviewed with patient? Yes  SHORT TERM GOALS: Target date: 10/28/2021  Patient will be independent in home exercise program to improve strength/mobility for better functional independence with ADLs. Baseline: to be initiated next 1-2 sessions; 10/17 previously issued, pt is  Goal status: INITIAL   LONG TERM GOALS: Target date: 12/09/2021  Patient will demo ability to perform at least 1 STS by pushing from [facility] with LUE instead of pulling to stand to improve functional mobility Baseline: Goal changed as pt FOTO tracked by OT, with new goal: pt currently pulls to stand Goal status: REVISED to new functional goal since OT tracking  2.   Patient (< 62 years old) will be able to perform and complete five times sit to stand test from standard chair height in <30 seconds to indicate increased LE strength and improved balance. Baseline: Pt able to complete only 1 stand, insufficient strength currently to complete full test; 10/17 31 seconds pull to stand with UE, close CGA-min a Goal status:  PARTIALLY MET  3.  Pt will improve LLE strength by at least 1/2 point on MMT in order to increase ease with transfers and functional mobility. Baseline: LLE grossly 4/5 throughout; 10/17: still generally 4/5 B, however now exhibits AROM with  R hip flexion and knee ext. Goal status: IN PROGRESS  4.  Patient will increase 10 meter walk test to at least 0.80 m/s as to improve gait speed for better community ambulation and to reduce fall risk. Baseline: to be tested next 1-2 visits; 10/17: pt unable to perform Goal status: IN PROGRESS  5.  Pt will complete STS from standard height chair with UUE assist off armrests and with no greater than min assist to improve ease and safety with transfers. Baseline: mod a x2; 10/17: pulling to  stand with LUE CGA Goal status: IN PROGRESS, Revised   ASSESSMENT:  CLINICAL IMPRESSION: Pt making gains AEB completing 5xSTS test today in 31 sec with pulling to stand.  While pt general BLE strength remains 4/5, pt now exhibits AROM of hip flexors and knee ext. on affected side, although not through full range (in part limited due to increased mm tone). PT has issued tricep extension as part of HEP to promote ability to perform STS by pushing from [facility], which pt is currently unable to do. The pt would benefit from continued skilled PT to address immobility, LE muscle weakness, and pain in order to decrease fall risk and increase functional mobility and QOL.   OBJECTIVE IMPAIRMENTS Abnormal gait, decreased activity tolerance, decreased balance, decreased coordination, decreased endurance, decreased mobility, difficulty walking, decreased ROM, decreased strength, hypomobility, increased edema, increased fascial restrictions, increased muscle spasms, impaired flexibility, impaired sensation, impaired tone, impaired UE functional use, improper body mechanics, postural dysfunction, and pain.   ACTIVITY LIMITATIONS carrying, lifting, bending, sitting, standing, squatting, stairs, transfers, bed mobility, bathing, toileting, dressing, reach over head, hygiene/grooming, locomotion level, and caring for others  PARTICIPATION LIMITATIONS: meal prep, cleaning, laundry, medication management, personal finances, driving, shopping, community activity, occupation, and yard work  PERSONAL Education officer, community, Past/current experiences, Sex, and 3+ comorbidities: Anorexia, dehydration, Adult Failure to thrive, contracture of muscles multiple sites, multiple falls, UTI, extensive PMH refer to chart  are also affecting patient's functional outcome.   REHAB POTENTIAL: Fair    CLINICAL DECISION MAKING: Evolving/moderate complexity  EVALUATION COMPLEXITY: High  PLAN: PT FREQUENCY: 2x/week  PT DURATION: 12  weeks  PLANNED INTERVENTIONS: Therapeutic exercises, Therapeutic activity, Neuromuscular re-education, Balance training, Gait training, Patient/Family education, Self Care, Joint mobilization, Joint manipulation, Stair training, Vestibular training, Canalith repositioning, Orthotic/Fit training, DME instructions, Wheelchair mobility training, Spinal mobilization, Cryotherapy, Moist heat, scar mobilization, Splintting, Taping, Manual therapy, and Re-evaluation  PLAN FOR NEXT SESSION:  stretching, strengthening, gait, balance, continue plan   Zollie Pee, PT 11/19/2021, 9:31 AM

## 2021-11-20 ENCOUNTER — Encounter: Payer: Medicare Other | Admitting: Speech Pathology

## 2021-11-20 ENCOUNTER — Ambulatory Visit: Payer: Medicare Other

## 2021-11-25 ENCOUNTER — Ambulatory Visit: Payer: Medicare Other

## 2021-11-25 ENCOUNTER — Encounter: Payer: Self-pay | Admitting: Occupational Therapy

## 2021-11-25 ENCOUNTER — Ambulatory Visit: Payer: Medicare Other | Admitting: Occupational Therapy

## 2021-11-25 DIAGNOSIS — R2681 Unsteadiness on feet: Secondary | ICD-10-CM

## 2021-11-25 DIAGNOSIS — R278 Other lack of coordination: Secondary | ICD-10-CM

## 2021-11-25 DIAGNOSIS — M6281 Muscle weakness (generalized): Secondary | ICD-10-CM | POA: Diagnosis not present

## 2021-11-25 DIAGNOSIS — R262 Difficulty in walking, not elsewhere classified: Secondary | ICD-10-CM

## 2021-11-25 DIAGNOSIS — R2689 Other abnormalities of gait and mobility: Secondary | ICD-10-CM

## 2021-11-25 NOTE — Therapy (Signed)
Occupational Therapy Progress Note  Dates of reporting period  09/08/2021   to   11/18/2021   Patient Name: Kelly Rollins MRN: [id number] DOB:[birth year], 51 y.o., female 35 Date: 11/25/2021  PCP: [name], PA-C REFERRING PROVIDER: [name], Corrine Jeani Hawking, PA-C    OT End of Session - 11/25/21 1405     Visit Number 21    Number of Visits 27    Date for OT Re-Evaluation 01/22/22    Authorization Time Period Progress reporting period starting 09/08/2021    OT Start Time 1302    OT Stop Time 1345    OT Time Calculation (min) 43 min    Activity Tolerance Patient tolerated treatment well    Behavior During Therapy [facility] for tasks assessed/performed                    Past Medical History:  Diagnosis Date   ABDOMINAL PAIN OTHER SPECIFIED SITE 02/21/2009   Qualifier: Diagnosis of  By: Deborra Medina MD, Talia     ABDOMINAL PAIN-LUQ 04/17/2009   Qualifier: Diagnosis of  By: Fuller Plan MD Lamont Snowball T    Anal fissure    Anginal pain ([place])    Asthma    Depression    Diabetes mellitus without complication ([place])    Dyspnea    Dysrhythmia    Headache(784.0)    HEMATOCHEZIA 04/17/2009   Qualifier: Diagnosis of  By: Fuller Plan MD Lamont Snowball T    Hyperlipidemia    Hypertension    IBS (irritable bowel syndrome)    Migraine headache 11/06/2008   Qualifier: Diagnosis of  By: Deborra Medina MD, Talia     OVARIAN CYST, RIGHT 02/21/2009   Qualifier: Diagnosis of  By: Deborra Medina MD, Talia     PALPITATIONS, OCCASIONAL 04/17/2010   Qualifier: Diagnosis of  By: Deborra Medina MD, Talia     Pancreatitis 10/14/2017   Past Surgical History:  Procedure Laterality Date   BREAST EXCISIONAL BIOPSY Left 1997   ? left side, done at time of reduction , benign   BREAST REDUCTION SURGERY     bilateral   CESAREAN SECTION     CHOLECYSTECTOMY     COLONOSCOPY     ESOPHAGOGASTRODUODENOSCOPY     ESOPHAGOGASTRODUODENOSCOPY (EGD) WITH PROPOFOL N/A 10/21/2020   Procedure: ESOPHAGOGASTRODUODENOSCOPY (EGD) WITH  PROPOFOL;  Surgeon: Lesly Rubenstein, MD;  Location: [facility] ENDOSCOPY;  Service: Endoscopy;  Laterality: N/A;   REDUCTION MAMMAPLASTY Bilateral 1997   VAGINAL HYSTERECTOMY     Patient Active Problem List   Diagnosis Date Noted   Abnormal MRI, lumbar spine (01/29/2020) 02/19/2020   Anxiety due to MRI 01/25/2020   Claustrophobia associated to MRI 01/25/2020   Enthesopathy of knee region (Right) 01/25/2020   Enthesopathy of knee region (Left) 01/25/2020   Lumbar facet hypertrophy 01/25/2020   Lumbosacral radiculopathy at S1 (Left) 01/17/2020   Lumbosacral radiculopathy (S1) (Left) 01/17/2020   Chronic lower extremity pain (Bilateral) (L>R) 01/17/2020   History of pancreatitis 07/18/2019   Obstructive sleep apnea syndrome 01/02/2019   Lumbar facet arthropathy (Multilevel) (Bilateral) 12/14/2018   Osteoarthritis involving multiple joints 11/28/2018   Chronic hip pain (Left) 11/16/2018   Acute hip pain (Left) 11/16/2018   DDD (degenerative disc disease), lumbosacral 11/16/2018   Chronic hip pain (Secondary source of pain) (Bilateral) (L>R) 05/19/2017   Elevated C-reactive protein (CRP) 05/19/2017   Elevated sed rate 05/19/2017   Spondylosis without myelopathy or radiculopathy, lumbosacral region 05/19/2017   Pharmacologic therapy 05/06/2017   Disorder of skeletal system 05/06/2017  Problems influencing health status 05/06/2017   Chest pain with moderate risk of acute coronary syndrome 04/21/2017   DOE (dyspnea on exertion) 04/21/2017   Intermittent palpitations 04/21/2017   Vitamin D insufficiency 05/04/2016   Depression 02/04/2016   Fatty liver 02/04/2016   Hypertension associated with diabetes ([place]) 02/04/2016   Chronic pain syndrome 01/15/2016   Acute low back pain 05/28/2015   Lumbar transverse process fracture (HCC) (Left L1) (03/11/15) 05/28/2015   Long term current use of opiate analgesic 05/01/2015   Long term prescription opiate use 05/01/2015   Opiate use (25 MME/Day)  05/01/2015   Encounter for therapeutic drug level monitoring 05/01/2015   Encounter for pain management planning 05/01/2015   Chronic low back pain (Primary Source of Pain) (Bilateral) (L>R) 05/01/2015   Lumbar facet syndrome (Bilateral) (L>R) 05/01/2015   Chronic knee pain [facility] source of pain) (Bilateral) (L>R) 05/01/2015   Lumbar spondylosis 05/01/2015   Neuropathic pain 05/01/2015   Neurogenic pain 05/01/2015   Myofascial pain 05/01/2015   Essential hypertriglyceridemia 04/26/2014   Type 2 diabetes mellitus with hyperglycemia, with long-term current use of insulin ([place]) 04/26/2014   Anxiety 05/15/2010   Obesity 05/15/2010   Hyperlipidemia 04/03/2010   Fatty liver disease 04/02/2010   Pure hypercholesterolemia 03/19/2010   GERD 04/17/2009   Constipation 04/17/2009   TOBACCO ABUSE 11/06/2008    ONSET DATE: 02/06/2021  REFERRING DIAG: Astrocytoma  THERAPY DIAG:  Muscle weakness (generalized)  Rationale for Evaluation and Treatment Rehabilitation  SUBJECTIVE:     SUBJECTIVE STATEMENT:  Pt. Presents with a rash on her face, and neck. Pt. attributes it to using a beaded heat pack with lavendar oils at home.    Pt accompanied by: significant other  PERTINENT HISTORY:  Pt. Is a 51 y.o. 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 PMHx includes: Anorexia, dehydration, Adult Failure to thrive, contracture of muscles multiple sites. Pt./caregiver reports recently being hospitalized with a UTI, and dehydration.   PRECAUTIONS: Fall  WEIGHT BEARING RESTRICTIONS No  PAIN:  Are you having pain?  Pt. reports 7/10 pain in the RUE  FALLS: Has patient fallen in last 6 months? Yes. Number of falls Multiple Falls  LIVING ENVIRONMENT: Lives with: Family Lives in: House/apartment Stairs: Yes: External: 4 steps; bilateral but cannot reach both Has following equipment at home: Single point cane,  Walker - 4 wheeled, Hemi walker, shower chair, bed side commode, and Grab bars Lift chair in the tub, [place],  PLOF: Independent  PATIENT GOALS: To improve the right UE  OBJECTIVE:   HAND DOMINANCE: Right  ADLs: Transfers/ambulation related to ADLs: Eating: MaxA with cutting food, setting up, Modified I with nondominant Left hand, [place]  Grooming: Independent  UB Dressing: MaxA  LB Dressing: MaxA Toileting: Uses a BSCommode mInA transfers, MaxA clothing negotiation Bathing: MaxA Tub Shower transfers:  Uses a shower lift chair Equipment:  w/c, BSCommode, cane   IADLs: Shopping: Husband performs Light housekeeping: Max Meal Prep: MaxA Community mobility: Relies on family Medication management:Husband assists Doctor, hospital: Performs together Handwriting: Not legible  MOBILITY STATUS: Hx of falls   ACTIVITY TOLERANCE: Activity tolerance: limited  FUNCTIONAL OUTCOME MEASURES: FOTO: 11.76  UPPER EXTREMITY ROM  : WFL Left AROM/PROM   Passive ROM Right eval Right  08/18/21 Right 09/08/2021 Right 10/30/2021 Right 11/18/2021  Shoulder flexion 30   48 15 36  Shoulder abduction 40 62 53 45 58  Shoulder adduction  Shoulder extension          Shoulder internal rotation          Shoulder external rotation          Elbow flexion 120   110 122 110  Elbow extension -67   -46 -72 -45  Wrist flexion Neutral   Neutral Neutral Neutral  Wrist extension 12   40 25 50  Wrist ulnar deviation          Wrist radial deviation          Wrist pronation          Wrist supination          (Blank rows = not tested)     UPPER EXTREMITY MMT:      MMT Right eval Left eval  Shoulder flexion 0/5 4+/5  Shoulder abduction 0/5 4+/5  Shoulder adduction      Shoulder extension      Shoulder internal rotation      Shoulder external rotation      Middle trapezius      Lower trapezius      Elbow flexion 0/5 4+/5  Elbow extension 0/5 4+/5  Wrist flexion      Wrist  extension 0/5 4+/5  Wrist ulnar deviation      Wrist radial deviation      Wrist pronation      Wrist supination      (Blank rows = not tested)   FOTO score: 09/08/2021: 11   SENSATION: Light touch: WFL  EDEMA:   Wrist: 18.5 cm MCP: 21.5 cm  10/30/2021:  Wrist: 17.5 cm MCPs: 20cm    MUSCLE TONE: RUE: Severe  COGNITION: Overall cognitive status: Impaired  VISION: Subjective report: Wears glasses Baseline vision: Wears glasses all the time Pt. Reports no changes    PRAXIS: Impaired: Motor planning  TODAY'S TREATMENT:    Pt. tolerated PROM for  shoulder flexion, and abduction. AAROM/PROM for shoulder flexion at the pillow while moving the arm out to the edge of the pillow and back with MaxA in AAROM. Patient tolerated right wrist flexion and extension, radial and ulnar deviation, forearm supination/pronation, digit flexion and extension, and thumb abduction passive range of motion.  Pt./caregiver education was provided about positioning to increase progressive passive stretching of right shoulder abduction with pillows.  Manual Therapy:  Pt. Tolerated scapular mobilizations in elevation, depression, abduction, and rotation, and soft tissue massage to the right scapular region. Manual therapy was performed independent of,a nd in preparation for ROM.    Pt. reports 7/10 pain in the RUE today. Pt.'s right shoulder flexion, abduction, and digit extension ROM continues to be limited by pain today. Patient presents with increased flexor tone, stiffness, and tightness in the RUE. Pt. presents with increased flexor tightness in the right 2nd, and 3rd digits. Pt. continues to initially maintain the digits on her right hand in tight flexor synergy pattern. Pt. was able to tolerate soft tissue massage and ROM to the right hand, and digits. Pt. continues to work on improving pain, and improving RUE ROM in order to improve engagement with ADLs/IADLs, and promote good skin integrity through  the palmar surface of the hand.  PATIENT EDUCATION: Education details: Right UE ROM, positioning of the RUE Person educated: Patient and Spouse Education method: Explanation, Demonstration, Tactile cues, and Verbal cues Education comprehension: verbalized understanding, returned demonstration, and needs further education   HOME EXERCISE PROGRAM:   Pt./caregiver education about positioning of the RUE, ROM  GOALS: Goals reviewed with patient? Yes  SHORT TERM GOALS: Target date: 12/11/2021      Pt./caregivers will demonstrate independence with HEPs for UE ROM, and edema control Baseline:  11/18/2021: Edema has improved. 10/30/2021: Pt. Edema has improved. Pt. Requires caregiver assist for positioning. 09/08/2021: Pt. Requires assist for positioning, ROM, edema control. Eval: No current HEPs  Goal status: Ongoing    LONG TERM GOALS: Target date: 01/22/2022    Pt. Will improve right hand edema by 1 cm at the wrist, and MCPs Baseline: 11/18/2021: Edema has improved overall  10/30/2021:  wrist: 17.5 cm, MCPs: 20 cm 09/08/2021: Wrist: 17.5 cm, MCPs: 20.5 cm Eval: Wrist: 18.5cm, MCPs: 21.5cm Goal status: Ongoing  2.  Pt. Will improve right shoulder PROM by 10 degrees with ADLs Baseline: 11/18/2021: shoulder flexion: 36, abduction:  58  10/30/2021: shoulder flexion: 15, abduction: 45 09/08/2021: Right shoulder flexion: 48, abduction: 53 Eval: PROM shoulder flexion: 30 degrees, Abduction: 40 degrees Goal status: Ongoing  3.  Pt. Will improve right elbow PROM by 10 degrees to prepare for hand to mouth patterns Baseline: 11/18/2021: -45 , flexion: 110 10/30/2021:elbow extension -72, flexion: 122 09/08/2021: elbow extension: -46, flexion: 110 Eval:   elbow extension -67, flexion 120 Goal status: Ongoing  4.  Pt. Will improve PROM right wrist extension by 10 degrees Baseline: 11/18/2021: wrist extension 50 10/30/2021: wrist extension 25 09/08/2021: Wrist extension: 40 Eval: wrist extension 12  degrees, flexion to neutral Goal status: Ongoing  5.  Pt. Will tolerate full digit extension in the right hand to promote good skin integrity through the palmar surface of the right hand.  Baseline: 11/18/2021: Pt. Keeps her right hand in tight flexor synergy. Pt. Intermittently tolerates  digit extension through 50% of the ROM. 10/30/2021: Pt. Tolerates ROM through 50% of  digit extension range, occasionally 75% 09/08/2021: Pt. Is able to tolerate PROM through 50% of digit extension in the digits. Eval: Pt. Maintains digits in tight flexion Goal status: Ongoing  6.  Pt. Will require minA UE dressing using one armed dressing techniques. Baseline: 11/18/2021: MinA-ModA UE dressing.  10/30/2021: Independent with button down shirt/sweater/cardigan, MaxA pullover shirt.09/08/2021: MaxA Eval: MaxA Goal status: Ongoing   ASSESSMENT:  CLINICAL IMPRESSION:  Pt. reports 7/10 pain in the RUE today. Pt.'s right shoulder flexion, abduction, and digit extension ROM continues to be limited by pain today. Patient presents with increased flexor tone, stiffness, and tightness in the RUE. Pt. presents with increased flexor tightness in the right 2nd, and 3rd digits. Pt. continues to initially maintain the digits on her right hand in tight flexor synergy pattern. Pt. was able to tolerate soft tissue massage and ROM to the right hand, and digits. Pt. continues to work on improving pain, and improving RUE ROM in order to improve engagement with ADLs/IADLs, and promote good skin integrity through the palmar surface of the hand.     PERFORMANCE DEFICITS in functional skills including ADLs, IADLs, coordination, proprioception, sensation, edema, tone, ROM, strength, pain, FMC, GMC, balance, decreased knowledge of use of DME, skin integrity, and UE functional use, cognitive skills including attention, memory, and safety awareness, and psychosocial skills including coping strategies.   IMPAIRMENTS are limiting patient from  ADLs, IADLs, education, work, leisure, and social participation.   COMORBIDITIES may have co-morbidities  that affects occupational performance. Patient will benefit from skilled OT to address above impairments and improve overall function.  MODIFICATION OR ASSISTANCE TO COMPLETE EVALUATION: Maximum modification of tasks or assist with assess necessary to  complete an evaluation.  OT OCCUPATIONAL PROFILE AND HISTORY: Detailed assessment: Review of records and additional review of physical, cognitive, psychosocial history related to current functional performance.  CLINICAL DECISION MAKING: High - multiple treatment options, significant modification of task necessary  REHAB POTENTIAL: Good for stated goals  EVALUATION COMPLEXITY: High    PLAN: OT FREQUENCY: 2x/week  OT DURATION: 12 weeks  PLANNED INTERVENTIONS: self care/ADL training, therapeutic exercise, therapeutic activity, neuromuscular re-education, manual therapy, passive range of motion, paraffin, moist heat, contrast bath, patient/family education, cognitive remediation/compensation, energy conservation, and coping strategies training  RECOMMENDED OTHER SERVICES: PT, and ST  CONSULTED AND AGREED WITH PLAN OF CARE: Patient and family member/caregiver  PLAN FOR NEXT SESSION: Initiate PROM, and RUE Edema control techniques    Harrel Carina, MS, OTR/L   Harrel Carina, OT 11/25/2021, 2:14 PM

## 2021-11-25 NOTE — Therapy (Addendum)
OUTPATIENT PHYSICAL THERAPY NEURO TREATMENT    Patient Name: Kelly Rollins MRN: [id number] DOB:09/06/1970, 51 y.o., female Today's Date: 11/25/2021   PCP: [name], PA-C REFERRING PROVIDER: [name], Bonnell Public, PA-C   PT End of Session - 11/25/21 1405     Visit Number 11    Number of Visits 25    Date for PT Re-Evaluation 12/09/21    Authorization Type [employer] Medicare    Authorization Time Period 09/16/21-12/09/21                     Past Medical History:  Diagnosis Date   ABDOMINAL PAIN OTHER SPECIFIED SITE 02/21/2009   Qualifier: Diagnosis of  By: Deborra Medina MD, Talia     ABDOMINAL PAIN-LUQ 04/17/2009   Qualifier: Diagnosis of  By: Fuller Plan MD Lamont Snowball T    Anal fissure    Anginal pain ([place])    Asthma    Depression    Diabetes mellitus without complication ([place])    Dyspnea    Dysrhythmia    Headache(784.0)    HEMATOCHEZIA 04/17/2009   Qualifier: Diagnosis of  By: Fuller Plan MD Lamont Snowball T    Hyperlipidemia    Hypertension    IBS (irritable bowel syndrome)    Migraine headache 11/06/2008   Qualifier: Diagnosis of  By: Deborra Medina MD, Talia     OVARIAN CYST, RIGHT 02/21/2009   Qualifier: Diagnosis of  By: Deborra Medina MD, Talia     PALPITATIONS, OCCASIONAL 04/17/2010   Qualifier: Diagnosis of  By: Deborra Medina MD, Talia     Pancreatitis 10/14/2017   Past Surgical History:  Procedure Laterality Date   BREAST EXCISIONAL BIOPSY Left 1997   ? left side, done at time of reduction , benign   BREAST REDUCTION SURGERY     bilateral   CESAREAN SECTION     CHOLECYSTECTOMY     COLONOSCOPY     ESOPHAGOGASTRODUODENOSCOPY     ESOPHAGOGASTRODUODENOSCOPY (EGD) WITH PROPOFOL N/A 10/21/2020   Procedure: ESOPHAGOGASTRODUODENOSCOPY (EGD) WITH PROPOFOL;  Surgeon: Lesly Rubenstein, MD;  Location: [facility] ENDOSCOPY;  Service: Endoscopy;  Laterality: N/A;   REDUCTION MAMMAPLASTY Bilateral 1997   VAGINAL HYSTERECTOMY     Patient Active Problem List   Diagnosis Date Noted    Abnormal MRI, lumbar spine (01/29/2020) 02/19/2020   Anxiety due to MRI 01/25/2020   Claustrophobia associated to MRI 01/25/2020   Enthesopathy of knee region (Right) 01/25/2020   Enthesopathy of knee region (Left) 01/25/2020   Lumbar facet hypertrophy 01/25/2020   Lumbosacral radiculopathy at S1 (Left) 01/17/2020   Lumbosacral radiculopathy (S1) (Left) 01/17/2020   Chronic lower extremity pain (Bilateral) (L>R) 01/17/2020   History of pancreatitis 07/18/2019   Obstructive sleep apnea syndrome 01/02/2019   Lumbar facet arthropathy (Multilevel) (Bilateral) 12/14/2018   Osteoarthritis involving multiple joints 11/28/2018   Chronic hip pain (Left) 11/16/2018   Acute hip pain (Left) 11/16/2018   DDD (degenerative disc disease), lumbosacral 11/16/2018   Chronic hip pain (Secondary source of pain) (Bilateral) (L>R) 05/19/2017   Elevated C-reactive protein (CRP) 05/19/2017   Elevated sed rate 05/19/2017   Spondylosis without myelopathy or radiculopathy, lumbosacral region 05/19/2017   Pharmacologic therapy 05/06/2017   Disorder of skeletal system 05/06/2017   Problems influencing health status 05/06/2017   Chest pain with moderate risk of acute coronary syndrome 04/21/2017   DOE (dyspnea on exertion) 04/21/2017   Intermittent palpitations 04/21/2017   Vitamin D insufficiency 05/04/2016   Depression 02/04/2016   Fatty liver 02/04/2016   Hypertension  associated with diabetes ([place]) 02/04/2016   Chronic pain syndrome 01/15/2016   Acute low back pain 05/28/2015   Lumbar transverse process fracture ([place]) (Left L1) (03/11/15) 05/28/2015   Long term current use of opiate analgesic 05/01/2015   Long term prescription opiate use 05/01/2015   Opiate use (25 MME/Day) 05/01/2015   Encounter for therapeutic drug level monitoring 05/01/2015   Encounter for pain management planning 05/01/2015   Chronic low back pain (Primary Source of Pain) (Bilateral) (L>R) 05/01/2015   Lumbar facet syndrome  (Bilateral) (L>R) 05/01/2015   Chronic knee pain [facility] source of pain) (Bilateral) (L>R) 05/01/2015   Lumbar spondylosis 05/01/2015   Neuropathic pain 05/01/2015   Neurogenic pain 05/01/2015   Myofascial pain 05/01/2015   Essential hypertriglyceridemia 04/26/2014   Type 2 diabetes mellitus with hyperglycemia, with long-term current use of insulin ([place]) 04/26/2014   Anxiety 05/15/2010   Obesity 05/15/2010   Hyperlipidemia 04/03/2010   Fatty liver disease 04/02/2010   Pure hypercholesterolemia 03/19/2010   GERD 04/17/2009   Constipation 04/17/2009   TOBACCO ABUSE 11/06/2008    ONSET DATE: January 2023  REFERRING DIAG: C71.9 (ICD-10-CM) - Malignant neoplasm of brain, unspecified  THERAPY DIAG:  Muscle weakness (generalized)  Other lack of coordination  Difficulty in walking, not elsewhere classified  Unsteadiness on feet  Other abnormalities of gait and mobility  Rationale for Evaluation and Treatment Rehabilitation  SUBJECTIVE:                                                                                                                                                                                              SUBJECTIVE STATEMENT: Doing well. Started chemo again so pretty worn out.   Pt accompanied by:  [name]  PAIN:  Are you having pain? 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 Pt's husband, [name], present with pt. [name] provides majority of hx as pt has difficulty with speech due to Broca's aphasia (working with SLP). Pt reports partial paralysis of R side. She is unable to move her RUE, RLE also affected. Pt ambulates with hemiwalker in their home for short distances and does not use WC at home. She fatigues quickly with ambulation. Pt has AFO for RLE but unable to determine how to  donn it without it hurting her LE. Pt currently with wound on R calf covered by large bandage that they have been treating for at least a month (reports physician aware). In addition to wound  on R calf, pt reports R side skin irritation under breast and lateral trunk due to bra underwire. They have since removed underwire. Pt was hospitalized 10 days in June due to UTI. She has had at least 12 falls in the last six months. Reports no injuries with falls, and states "just bumps and bruises." Pt reports she has constant head pain. Pt spouse reports pt has pain throughout her whole right side down to LE. Can have bad cramps in RUE and RLE. Reports no numbness/tingling. Occ dizziness, unable to describe further. Pt sleeps in recliner, wakes up stiff in the morning. PMH per chart includes: Anorexia, dehydration, Adult Failure to thrive, contracture of muscles multiple sites. Pt./caregiver reports recently being hospitalized with a UTI, and dehydration. Extensive hx, please refer to chart for full details.   PAIN: from eval Are you having pain? Yes: NPRS scale: 3/10 Pain location: Top of head, front Pain description: States, "cancer pain" head pain Aggravating factors: unsure Relieving factors: medications  PRECAUTIONS: Fall  WEIGHT BEARING RESTRICTIONS No  FALLS: Has patient fallen in last 6 months? Yes. Number of falls 12  LIVING ENVIRONMENT: information primarily from chart Lives with: lives with their spouse Lives in: House/apartment Stairs: Yes: External: 4 steps; bilateral but cannot reach both Has following equipment at home: Single point cane, Walker - 4 wheeled, Hemi walker, shower chair, bed side commode, Grab bars, and lift chair  PLOF: Independent  PATIENT GOALS  "Normal function" as much as possible. Improving her balance.    TODAY'S TREATMENT:  -ergonmeter 4 minutes alternating -orthostatic dizziness upon first stand  Transfer with husband  AMB 11f FWD, 178fback, LUE HW, WC  follow  -STS x8 WC + airex, LUE push off arm rest, education on centralized Left foo  -STS x5 same as above  -70M AMB FWD with HW 3 minutes 30 sec  -statitc standing balance unsupported x10    PATIENT EDUCATION: Education details: Pt educated throughout session about proper posture and technique with exercises. Improved exercise technique, movement at target joints, use of target muscles after min to mod verbal, visual, tactile cues. Goals, HEP, plan  Person educated: pt and her spouse Education method:Explanation, Demonstration, Tactile cues, and Verbal cues, printout of HEP Education comprehension: verbalized understanding, returned demonstration, tactile cues required, and needs further education   HOME EXERCISE PROGRAM:     Access Code: [email or web address] URL: [email or web address] Date: 11/18/2021 Prepared by: HaRicard DillonExercises - Elbow Extension with Anchored Resistance  - 1 x daily - 5 x weekly - 3 sets - 10 reps  Access Code: [email or web address]: [email or web address] Date: 10/02/2021 Prepared by: JeSande BrothersExercises - Seated Hip Flexion March with Ankle Weights  - 1 x daily - 7 x weekly - 3 sets - 10 reps - Seated Long Arc Quad  - 1 x daily - 7 x weekly - 3 sets - 10 reps - Supine Quad Set  - 1 x daily - 7 x weekly - 3 sets - 10 reps - Proper Sit to Stand Technique  - 1 x daily - 7 x weekly - 3 sets - 10 reps  GOALS: Goals reviewed with patient? Yes  SHORT TERM GOALS: Target date: 10/28/2021  Patient will be independent in home exercise program to improve strength/mobility for better functional independence with ADLs. Baseline: to be initiated next 1-2 sessions; 10/17 previously issued, pt is  Goal status: INITIAL   LONG TERM GOALS: Target date: 12/09/2021  Patient will demo ability to perform  at least 1 STS by pushing from [facility] with LUE instead of pulling to stand to improve functional mobility Baseline: Goal changed as pt FOTO tracked  by OT, with new goal: pt currently pulls to stand Goal status: REVISED to new functional goal since OT tracking  2.   Patient (< 56 years old) will be able to perform and complete five times sit to stand test from standard chair height in <30 seconds to indicate increased LE strength and improved balance. Baseline: Pt able to complete only 1 stand, insufficient strength currently to complete full test; 10/17 31 seconds pull to stand with UE, close CGA-min a Goal status:  PARTIALLY MET  3.  Pt will improve LLE strength by at least 1/2 point on MMT in order to increase ease with transfers and functional mobility. Baseline: LLE grossly 4/5 throughout; 10/17: still generally 4/5 B, however now exhibits AROM with R hip flexion and knee ext. Goal status: IN PROGRESS  4.  Patient will increase 10 meter walk test to at least 0.80 m/s as to improve gait speed for better community ambulation and to reduce fall risk. Baseline: to be tested next 1-2 visits; 10/17: pt unable to perform Goal status: IN PROGRESS  5.  Pt will complete STS from standard height chair with UUE assist off armrests and with no greater than min assist to improve ease and safety with transfers. Baseline: mod a x2; 10/17: pulling to stand with LUE CGA Goal status: IN PROGRESS, Revised   ASSESSMENT:  CLINICAL IMPRESSION: P The pt would benefit from continued skilled PT to address immobility, LE muscle weakness, and pain in order to decrease fall risk and increase functional mobility and QOL.   OBJECTIVE IMPAIRMENTS Abnormal gait, decreased activity tolerance, decreased balance, decreased coordination, decreased endurance, decreased mobility, difficulty walking, decreased ROM, decreased strength, hypomobility, increased edema, increased fascial restrictions, increased muscle spasms, impaired flexibility, impaired sensation, impaired tone, impaired UE functional use, improper body mechanics, postural dysfunction, and pain.    ACTIVITY LIMITATIONS carrying, lifting, bending, sitting, standing, squatting, stairs, transfers, bed mobility, bathing, toileting, dressing, reach over head, hygiene/grooming, locomotion level, and caring for others  PARTICIPATION LIMITATIONS: meal prep, cleaning, laundry, medication management, personal finances, driving, shopping, community activity, occupation, and yard work  PERSONAL Education officer, community, Past/current experiences, Sex, and 3+ comorbidities: Anorexia, dehydration, Adult Failure to thrive, contracture of muscles multiple sites, multiple falls, UTI, extensive PMH refer to chart  are also affecting patient's functional outcome.   REHAB POTENTIAL: Fair    CLINICAL DECISION MAKING: Evolving/moderate complexity  EVALUATION COMPLEXITY: High  PLAN: PT FREQUENCY: 2x/week  PT DURATION: 12 weeks  PLANNED INTERVENTIONS: Therapeutic exercises, Therapeutic activity, Neuromuscular re-education, Balance training, Gait training, Patient/Family education, Self Care, Joint mobilization, Joint manipulation, Stair training, Vestibular training, Canalith repositioning, Orthotic/Fit training, DME instructions, Wheelchair mobility training, Spinal mobilization, Cryotherapy, Moist heat, scar mobilization, Splintting, Taping, Manual therapy, and Re-evaluation  PLAN FOR NEXT SESSION:  stretching, strengthening, gait, balance, continue plan 2:37 PM, 11/25/21 Etta Grandchild, PT, DPT Physical Therapist - [place] [phone]      [place], PT 11/25/2021, 2:25 PM

## 2021-11-27 ENCOUNTER — Encounter: Payer: Medicare Other | Admitting: Speech Pathology

## 2021-11-27 ENCOUNTER — Ambulatory Visit: Payer: Medicare Other | Admitting: Occupational Therapy

## 2021-11-27 ENCOUNTER — Ambulatory Visit: Payer: Medicare Other

## 2021-11-27 NOTE — Therapy (Incomplete)
OUTPATIENT PHYSICAL THERAPY NEURO TREATMENT    Patient Name: Kelly Rollins MRN: [id number] DOB:1970/09/06, 51 y.o., female Today's Date: 11/27/2021   PCP: [name], PA-C REFERRING PROVIDER: [name], Bonnell Public, PA-C             Past Medical History:  Diagnosis Date   ABDOMINAL PAIN OTHER SPECIFIED SITE 02/21/2009   Qualifier: Diagnosis of  By: Deborra Medina MD, Talia     ABDOMINAL PAIN-LUQ 04/17/2009   Qualifier: Diagnosis of  By: Fuller Plan MD Lamont Snowball T    Anal fissure    Anginal pain ([place])    Asthma    Depression    Diabetes mellitus without complication ([place])    Dyspnea    Dysrhythmia    Headache(784.0)    HEMATOCHEZIA 04/17/2009   Qualifier: Diagnosis of  By: Fuller Plan MD Lamont Snowball T    Hyperlipidemia    Hypertension    IBS (irritable bowel syndrome)    Migraine headache 11/06/2008   Qualifier: Diagnosis of  By: Deborra Medina MD, Talia     OVARIAN CYST, RIGHT 02/21/2009   Qualifier: Diagnosis of  By: Deborra Medina MD, Talia     PALPITATIONS, OCCASIONAL 04/17/2010   Qualifier: Diagnosis of  By: Deborra Medina MD, Talia     Pancreatitis 10/14/2017   Past Surgical History:  Procedure Laterality Date   BREAST EXCISIONAL BIOPSY Left 1997   ? left side, done at time of reduction , benign   BREAST REDUCTION SURGERY     bilateral   CESAREAN SECTION     CHOLECYSTECTOMY     COLONOSCOPY     ESOPHAGOGASTRODUODENOSCOPY     ESOPHAGOGASTRODUODENOSCOPY (EGD) WITH PROPOFOL N/A 10/21/2020   Procedure: ESOPHAGOGASTRODUODENOSCOPY (EGD) WITH PROPOFOL;  Surgeon: Lesly Rubenstein, MD;  Location: [facility] ENDOSCOPY;  Service: Endoscopy;  Laterality: N/A;   REDUCTION MAMMAPLASTY Bilateral 1997   VAGINAL HYSTERECTOMY     Patient Active Problem List   Diagnosis Date Noted   Abnormal MRI, lumbar spine (01/29/2020) 02/19/2020   Anxiety due to MRI 01/25/2020   Claustrophobia associated to MRI 01/25/2020   Enthesopathy of knee region (Right) 01/25/2020   Enthesopathy of knee region (Left)  01/25/2020   Lumbar facet hypertrophy 01/25/2020   Lumbosacral radiculopathy at S1 (Left) 01/17/2020   Lumbosacral radiculopathy (S1) (Left) 01/17/2020   Chronic lower extremity pain (Bilateral) (L>R) 01/17/2020   History of pancreatitis 07/18/2019   Obstructive sleep apnea syndrome 01/02/2019   Lumbar facet arthropathy (Multilevel) (Bilateral) 12/14/2018   Osteoarthritis involving multiple joints 11/28/2018   Chronic hip pain (Left) 11/16/2018   Acute hip pain (Left) 11/16/2018   DDD (degenerative disc disease), lumbosacral 11/16/2018   Chronic hip pain (Secondary source of pain) (Bilateral) (L>R) 05/19/2017   Elevated C-reactive protein (CRP) 05/19/2017   Elevated sed rate 05/19/2017   Spondylosis without myelopathy or radiculopathy, lumbosacral region 05/19/2017   Pharmacologic therapy 05/06/2017   Disorder of skeletal system 05/06/2017   Problems influencing health status 05/06/2017   Chest pain with moderate risk of acute coronary syndrome 04/21/2017   DOE (dyspnea on exertion) 04/21/2017   Intermittent palpitations 04/21/2017   Vitamin D insufficiency 05/04/2016   Depression 02/04/2016   Fatty liver 02/04/2016   Hypertension associated with diabetes ([place]) 02/04/2016   Chronic pain syndrome 01/15/2016   Acute low back pain 05/28/2015   Lumbar transverse process fracture ([place]) (Left L1) (03/11/15) 05/28/2015   Long term current use of opiate analgesic 05/01/2015   Long term prescription opiate use 05/01/2015   Opiate use (25 MME/Day)  05/01/2015   Encounter for therapeutic drug level monitoring 05/01/2015   Encounter for pain management planning 05/01/2015   Chronic low back pain (Primary Source of Pain) (Bilateral) (L>R) 05/01/2015   Lumbar facet syndrome (Bilateral) (L>R) 05/01/2015   Chronic knee pain [facility] source of pain) (Bilateral) (L>R) 05/01/2015   Lumbar spondylosis 05/01/2015   Neuropathic pain 05/01/2015   Neurogenic pain 05/01/2015   Myofascial pain  05/01/2015   Essential hypertriglyceridemia 04/26/2014   Type 2 diabetes mellitus with hyperglycemia, with long-term current use of insulin ([place]) 04/26/2014   Anxiety 05/15/2010   Obesity 05/15/2010   Hyperlipidemia 04/03/2010   Fatty liver disease 04/02/2010   Pure hypercholesterolemia 03/19/2010   GERD 04/17/2009   Constipation 04/17/2009   TOBACCO ABUSE 11/06/2008    ONSET DATE: January 2023  REFERRING DIAG: C71.9 (ICD-10-CM) - Malignant neoplasm of brain, unspecified  THERAPY DIAG:  No diagnosis found.  Rationale for Evaluation and Treatment Rehabilitation  SUBJECTIVE:                                                                                                                                                                                              SUBJECTIVE STATEMENT: Doing well. Started chemo again so pretty worn out.   Pt accompanied by:  Kelly Rollins  PAIN:  Are you having pain? 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 Pt's husband, Kelly Rollins, present with pt. Kelly Rollins provides majority of hx as pt has difficulty with speech due to Broca's aphasia (working with SLP). Pt reports partial paralysis of R side. She is unable to move her RUE, RLE also affected. Pt ambulates with hemiwalker in their home for short distances and does not use WC at home. She fatigues quickly with ambulation. Pt has AFO for RLE but unable to determine how to donn it without it hurting her LE. Pt currently with wound on R calf covered by large bandage that they have been treating for at least a month (reports physician aware). In addition to wound on R calf, pt reports R side skin irritation under breast and lateral trunk due to bra underwire. They have since removed underwire. Pt was hospitalized 10 days in June due to UTI. She  has had at least 12 falls in the last six months. Reports no injuries with falls, and states "just bumps and bruises." Pt reports she has constant head pain. Pt spouse reports pt has pain throughout her whole right side down to  LE. Can have bad cramps in RUE and RLE. Reports no numbness/tingling. Occ dizziness, unable to describe further. Pt sleeps in recliner, wakes up stiff in the morning. PMH per chart includes: Anorexia, dehydration, Adult Failure to thrive, contracture of muscles multiple sites. Pt./caregiver reports recently being hospitalized with a UTI, and dehydration. Extensive hx, please refer to chart for full details.   PAIN: from eval Are you having pain? Yes: NPRS scale: 3/10 Pain location: Top of head, front Pain description: States, "cancer pain" head pain Aggravating factors: unsure Relieving factors: medications  PRECAUTIONS: Fall  WEIGHT BEARING RESTRICTIONS No  FALLS: Has patient fallen in last 6 months? Yes. Number of falls 12  LIVING ENVIRONMENT: information primarily from chart Lives with: lives with their spouse Lives in: House/apartment Stairs: Yes: External: 4 steps; bilateral but cannot reach both Has following equipment at home: Single point cane, Walker - 4 wheeled, Hemi walker, shower chair, bed side commode, Grab bars, and lift chair  PLOF: Independent  PATIENT GOALS  "Normal function" as much as possible. Improving her balance.    TODAY'S TREATMENT:  -ergonmeter 4 minutes alternating -orthostatic dizziness upon first stand  Transfer with husband  AMB 38f FWD, 89fback, LUE HW, WC follow  -STS x8 WC + airex, LUE push off arm rest, education on centralized Left foo  -STS x5 same as above  -68M AMB FWD with HW 3 minutes 30 sec  -statitc standing balance unsupported x10    PATIENT EDUCATION: Education details: Pt educated throughout session about proper posture and technique with exercises. Improved exercise technique, movement at target joints,  use of target muscles after min to mod verbal, visual, tactile cues. Goals, HEP, plan  Person educated: pt and her spouse Education method:Explanation, Demonstration, Tactile cues, and Verbal cues, printout of HEP Education comprehension: verbalized understanding, returned demonstration, tactile cues required, and needs further education   HOME EXERCISE PROGRAM:     Access Code: [email or web address] URL: [email or web address] Date: 11/18/2021 Prepared by: HaRicard DillonExercises - Elbow Extension with Anchored Resistance  - 1 x daily - 5 x weekly - 3 sets - 10 reps  Access Code: [email or web address]: [email or web address] Date: 10/02/2021 Prepared by: JeSande BrothersExercises - Seated Hip Flexion March with Ankle Weights  - 1 x daily - 7 x weekly - 3 sets - 10 reps - Seated Long Arc Quad  - 1 x daily - 7 x weekly - 3 sets - 10 reps - Supine Quad Set  - 1 x daily - 7 x weekly - 3 sets - 10 reps - Proper Sit to Stand Technique  - 1 x daily - 7 x weekly - 3 sets - 10 reps  GOALS: Goals reviewed with patient? Yes  SHORT TERM GOALS: Target date: 10/28/2021  Patient will be independent in home exercise program to improve strength/mobility for better functional independence with ADLs. Baseline: to be initiated next 1-2 sessions; 10/17 previously issued, pt is  Goal status: INITIAL   LONG TERM GOALS: Target date: 12/09/2021  Patient will demo ability to perform at least 1 STS by [facility] instead of pulling to stand to improve functional mobility Baseline: Goal changed as pt FOTO tracked by OT, with new goal: pt currently pulls to stand Goal status: REVISED to new functional goal since OT tracking  2.   Patient (< [age]ears old) will be able to perform and complete five times sit to stand test from standard chair height in <30 seconds to  indicate increased LE strength and improved balance. Baseline: Pt able to complete only 1 stand, insufficient strength  currently to complete full test; 10/17 31 seconds pull to stand with UE, close CGA-min a Goal status:  PARTIALLY MET  3.  Pt will improve LLE strength by at least 1/2 point on MMT in order to increase ease with transfers and functional mobility. Baseline: LLE grossly 4/5 throughout; 10/17: still generally 4/5 B, however now exhibits AROM with R hip flexion and knee ext. Goal status: IN PROGRESS  4.  Patient will increase 10 meter walk test to at least 0.80 m/s as to improve gait speed for better community ambulation and to reduce fall risk. Baseline: to be tested next 1-2 visits; 10/17: pt unable to perform Goal status: IN PROGRESS  5.  Pt will complete STS from standard height chair with UUE assist off armrests and with no greater than min assist to improve ease and safety with transfers. Baseline: mod a x2; 10/17: pulling to stand with LUE CGA Goal status: IN PROGRESS, Revised   ASSESSMENT:  CLINICAL IMPRESSION: P The pt would benefit from continued skilled PT to address immobility, LE muscle weakness, and pain in order to decrease fall risk and increase functional mobility and QOL.   OBJECTIVE IMPAIRMENTS Abnormal gait, decreased activity tolerance, decreased balance, decreased coordination, decreased endurance, decreased mobility, difficulty walking, decreased ROM, decreased strength, hypomobility, increased edema, increased fascial restrictions, increased muscle spasms, impaired flexibility, impaired sensation, impaired tone, impaired UE functional use, improper body mechanics, postural dysfunction, and pain.   ACTIVITY LIMITATIONS carrying, lifting, bending, sitting, standing, squatting, stairs, transfers, bed mobility, bathing, toileting, dressing, reach over head, hygiene/grooming, locomotion level, and caring for others  PARTICIPATION LIMITATIONS: meal prep, cleaning, laundry, medication management, personal finances, driving, shopping, community activity, occupation, and yard  work  PERSONAL Education officer, community, Past/current experiences, Sex, and 3+ comorbidities: Anorexia, dehydration, Adult Failure to thrive, contracture of muscles multiple sites, multiple falls, UTI, extensive PMH refer to chart  are also affecting patient's functional outcome.   REHAB POTENTIAL: Fair    CLINICAL DECISION MAKING: Evolving/moderate complexity  EVALUATION COMPLEXITY: High  PLAN: PT FREQUENCY: 2x/week  PT DURATION: 12 weeks  PLANNED INTERVENTIONS: Therapeutic exercises, Therapeutic activity, Neuromuscular re-education, Balance training, Gait training, Patient/Family education, Self Care, Joint mobilization, Joint manipulation, Stair training, Vestibular training, Canalith repositioning, Orthotic/Fit training, DME instructions, Wheelchair mobility training, Spinal mobilization, Cryotherapy, Moist heat, scar mobilization, Splintting, Taping, Manual therapy, and Re-evaluation  PLAN FOR NEXT SESSION:  stretching, strengthening, gait, balance, continue plan 9:44 AM, 11/27/21   Zollie Pee, PT 11/27/2021, 9:44 AM

## 2021-12-02 ENCOUNTER — Encounter: Payer: Medicare Other | Admitting: Speech Pathology

## 2021-12-02 ENCOUNTER — Ambulatory Visit: Payer: Medicare Other | Admitting: Occupational Therapy

## 2021-12-02 ENCOUNTER — Encounter: Payer: Self-pay | Admitting: Occupational Therapy

## 2021-12-02 ENCOUNTER — Ambulatory Visit: Payer: Medicare Other

## 2021-12-02 DIAGNOSIS — R278 Other lack of coordination: Secondary | ICD-10-CM

## 2021-12-02 DIAGNOSIS — R262 Difficulty in walking, not elsewhere classified: Secondary | ICD-10-CM

## 2021-12-02 DIAGNOSIS — M6281 Muscle weakness (generalized): Secondary | ICD-10-CM | POA: Diagnosis not present

## 2021-12-02 DIAGNOSIS — R2681 Unsteadiness on feet: Secondary | ICD-10-CM

## 2021-12-02 NOTE — Therapy (Addendum)
Occupational Therapy Neuro Treatment Note  Patient Name: ANUREET BRUINGTON MRN: [id number] DOB:25-Jun-1970, 51 y.o., female 98 Date: 12/02/2021  PCP: [name], PA-C REFERRING PROVIDER: [name], Corrine Jeani Hawking, PA-C    OT End of Session - 12/02/21 1437     Visit Number 22    Number of Visits 48    Date for OT Re-Evaluation 01/22/22    Authorization Time Period Progress reporting period starting 09/08/2021    OT Start Time 1300    OT Stop Time 1345    OT Time Calculation (min) 45 min    Activity Tolerance Patient tolerated treatment well    Behavior During Therapy [facility] for tasks assessed/performed                    Past Medical History:  Diagnosis Date   ABDOMINAL PAIN OTHER SPECIFIED SITE 02/21/2009   Qualifier: Diagnosis of  By: Deborra Medina MD, Talia     ABDOMINAL PAIN-LUQ 04/17/2009   Qualifier: Diagnosis of  By: Fuller Plan MD Lamont Snowball T    Anal fissure    Anginal pain ([place])    Asthma    Depression    Diabetes mellitus without complication ([place])    Dyspnea    Dysrhythmia    Headache(784.0)    HEMATOCHEZIA 04/17/2009   Qualifier: Diagnosis of  By: Fuller Plan MD Lamont Snowball T    Hyperlipidemia    Hypertension    IBS (irritable bowel syndrome)    Migraine headache 11/06/2008   Qualifier: Diagnosis of  By: Deborra Medina MD, Talia     OVARIAN CYST, RIGHT 02/21/2009   Qualifier: Diagnosis of  By: Deborra Medina MD, Talia     PALPITATIONS, OCCASIONAL 04/17/2010   Qualifier: Diagnosis of  By: Deborra Medina MD, Talia     Pancreatitis 10/14/2017   Past Surgical History:  Procedure Laterality Date   BREAST EXCISIONAL BIOPSY Left 1997   ? left side, done at time of reduction , benign   BREAST REDUCTION SURGERY     bilateral   CESAREAN SECTION     CHOLECYSTECTOMY     COLONOSCOPY     ESOPHAGOGASTRODUODENOSCOPY     ESOPHAGOGASTRODUODENOSCOPY (EGD) WITH PROPOFOL N/A 10/21/2020   Procedure: ESOPHAGOGASTRODUODENOSCOPY (EGD) WITH PROPOFOL;  Surgeon: Lesly Rubenstein, MD;  Location:  [facility] ENDOSCOPY;  Service: Endoscopy;  Laterality: N/A;   REDUCTION MAMMAPLASTY Bilateral 1997   VAGINAL HYSTERECTOMY     Patient Active Problem List   Diagnosis Date Noted   Abnormal MRI, lumbar spine (01/29/2020) 02/19/2020   Anxiety due to MRI 01/25/2020   Claustrophobia associated to MRI 01/25/2020   Enthesopathy of knee region (Right) 01/25/2020   Enthesopathy of knee region (Left) 01/25/2020   Lumbar facet hypertrophy 01/25/2020   Lumbosacral radiculopathy at S1 (Left) 01/17/2020   Lumbosacral radiculopathy (S1) (Left) 01/17/2020   Chronic lower extremity pain (Bilateral) (L>R) 01/17/2020   History of pancreatitis 07/18/2019   Obstructive sleep apnea syndrome 01/02/2019   Lumbar facet arthropathy (Multilevel) (Bilateral) 12/14/2018   Osteoarthritis involving multiple joints 11/28/2018   Chronic hip pain (Left) 11/16/2018   Acute hip pain (Left) 11/16/2018   DDD (degenerative disc disease), lumbosacral 11/16/2018   Chronic hip pain (Secondary source of pain) (Bilateral) (L>R) 05/19/2017   Elevated C-reactive protein (CRP) 05/19/2017   Elevated sed rate 05/19/2017   Spondylosis without myelopathy or radiculopathy, lumbosacral region 05/19/2017   Pharmacologic therapy 05/06/2017   Disorder of skeletal system 05/06/2017   Problems influencing health status 05/06/2017   Chest pain with moderate  risk of acute coronary syndrome 04/21/2017   DOE (dyspnea on exertion) 04/21/2017   Intermittent palpitations 04/21/2017   Vitamin D insufficiency 05/04/2016   Depression 02/04/2016   Fatty liver 02/04/2016   Hypertension associated with diabetes ([place]) 02/04/2016   Chronic pain syndrome 01/15/2016   Acute low back pain 05/28/2015   Lumbar transverse process fracture (HCC) (Left L1) (03/11/15) 05/28/2015   Long term current use of opiate analgesic 05/01/2015   Long term prescription opiate use 05/01/2015   Opiate use (25 MME/Day) 05/01/2015   Encounter for therapeutic drug level  monitoring 05/01/2015   Encounter for pain management planning 05/01/2015   Chronic low back pain (Primary Source of Pain) (Bilateral) (L>R) 05/01/2015   Lumbar facet syndrome (Bilateral) (L>R) 05/01/2015   Chronic knee pain [facility] source of pain) (Bilateral) (L>R) 05/01/2015   Lumbar spondylosis 05/01/2015   Neuropathic pain 05/01/2015   Neurogenic pain 05/01/2015   Myofascial pain 05/01/2015   Essential hypertriglyceridemia 04/26/2014   Type 2 diabetes mellitus with hyperglycemia, with long-term current use of insulin ([place]) 04/26/2014   Anxiety 05/15/2010   Obesity 05/15/2010   Hyperlipidemia 04/03/2010   Fatty liver disease 04/02/2010   Pure hypercholesterolemia 03/19/2010   GERD 04/17/2009   Constipation 04/17/2009   TOBACCO ABUSE 11/06/2008    ONSET DATE: 02/06/2021  REFERRING DIAG: Astrocytoma  THERAPY DIAG:  Muscle weakness (generalized)  Rationale for Evaluation and Treatment Rehabilitation  SUBJECTIVE:     SUBJECTIVE STATEMENT:  Pt. reports that she she has conflicts with her scheduled appointments on Thursday this week, and the following Tuesday.    Pt accompanied by: significant other  PERTINENT HISTORY:  Pt. Is a 51 y.o. 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 PMHx includes: Anorexia, dehydration, Adult Failure to thrive, contracture of muscles multiple sites. Pt./caregiver reports recently being hospitalized with a UTI, and dehydration.   PRECAUTIONS: Fall  WEIGHT BEARING RESTRICTIONS No  PAIN:  Are you having pain?  Pt. reports 3/10 pain in the RUE  FALLS: Has patient fallen in last 6 months? Yes. Number of falls Multiple Falls  LIVING ENVIRONMENT: Lives with: Family Lives in: House/apartment Stairs: Yes: External: 4 steps; bilateral but cannot reach both Has following equipment at home: Single point cane, Walker - 4 wheeled, Hemi walker, shower chair, bed  side commode, and Grab bars Lift chair in the tub, [place],  PLOF: Independent  PATIENT GOALS: To improve the right UE  OBJECTIVE:   HAND DOMINANCE: Right  ADLs: Transfers/ambulation related to ADLs: Eating: MaxA with cutting food, setting up, Modified I with nondominant Left hand, [place]  Grooming: Independent  UB Dressing: MaxA  LB Dressing: MaxA Toileting: Uses a BSCommode mInA transfers, MaxA clothing negotiation Bathing: MaxA Tub Shower transfers:  Uses a shower lift chair Equipment:  w/c, BSCommode, cane   IADLs: Shopping: Husband performs Light housekeeping: Max Meal Prep: MaxA Community mobility: Relies on family Medication management:Husband assists Doctor, hospital: Performs together Handwriting: Not legible  MOBILITY STATUS: Hx of falls   ACTIVITY TOLERANCE: Activity tolerance: limited  FUNCTIONAL OUTCOME MEASURES: FOTO: 11.76  UPPER EXTREMITY ROM  : WFL Left AROM/PROM   Passive ROM Right eval Right  08/18/21 Right 09/08/2021 Right 10/30/2021 Right 11/18/2021  Shoulder flexion 30   48 15 36  Shoulder abduction 40 62 53 45 58  Shoulder adduction          Shoulder extension  Shoulder internal rotation          Shoulder external rotation          Elbow flexion 120   110 122 110  Elbow extension -67   -46 -72 -45  Wrist flexion Neutral   Neutral Neutral Neutral  Wrist extension 12   40 25 50  Wrist ulnar deviation          Wrist radial deviation          Wrist pronation          Wrist supination          (Blank rows = not tested)     UPPER EXTREMITY MMT:      MMT Right eval Left eval  Shoulder flexion 0/5 4+/5  Shoulder abduction 0/5 4+/5  Shoulder adduction      Shoulder extension      Shoulder internal rotation      Shoulder external rotation      Middle trapezius      Lower trapezius      Elbow flexion 0/5 4+/5  Elbow extension 0/5 4+/5  Wrist flexion      Wrist extension 0/5 4+/5  Wrist ulnar deviation      Wrist  radial deviation      Wrist pronation      Wrist supination      (Blank rows = not tested)   FOTO score: 09/08/2021: 11   SENSATION: Light touch: WFL  EDEMA:   Wrist: 18.5 cm MCP: 21.5 cm  10/30/2021:  Wrist: 17.5 cm MCPs: 20cm    MUSCLE TONE: RUE: Severe  COGNITION: Overall cognitive status: Impaired  VISION: Subjective report: Wears glasses Baseline vision: Wears glasses all the time Pt. Reports no changes    PRAXIS: Impaired: Motor planning  TODAY'S TREATMENT:    Pt. tolerated PROM for shoulder flexion, and abduction. AAROM/PROM for shoulder flexion at the pillow while moving the arm out to the edge of the pillow and back with MaxA in AAROM. Patient tolerated right wrist flexion and extension, radial and ulnar deviation, forearm supination/pronation, digit flexion and extension, and thumb abduction passive range of motion.  Pt./caregiver education was provided about positioning to increase progressive passive stretching of right shoulder abduction with pillows.  Manual Therapy:  Pt. Tolerated scapular mobilizations in elevation, depression, abduction, and rotation, and soft tissue massage to the right scapular region. Manual therapy was performed independent of,a nd in preparation for ROM.    Pt.'s facial rash has improved. Pt. reports 3/10 pain in the RUE today.  Pt. was able to tolerate increased PROM today. Pt.'s right shoulder flexion, abduction, and digit extension ROM continues to be limited by pain today. Patient presents with increased flexor tone, stiffness, and tightness in the RUE. Pt. presents with increased flexor tightness in the right 2nd, and 3rd digits. Pt. continues to initially maintain the digits on her right hand in tight flexor synergy pattern. Pt. was able to tolerate soft tissue massage and ROM to the right hand, and digits. Pt. continues to work on improving pain, and improving RUE ROM in order to improve engagement with ADLs/IADLs, and promote  good skin integrity through the palmar surface of the hand.  PATIENT EDUCATION: Education details: Right UE ROM, positioning of the RUE Person educated: Patient and Spouse Education method: Explanation, Demonstration, Tactile cues, and Verbal cues Education comprehension: verbalized understanding, returned demonstration, and needs further education   HOME EXERCISE PROGRAM:   Pt./caregiver education about positioning of the  RUE, ROM    GOALS: Goals reviewed with patient? Yes  SHORT TERM GOALS: Target date: 12/11/2021      Pt./caregivers will demonstrate independence with HEPs for UE ROM, and edema control Baseline:  11/18/2021: Edema has improved. 10/30/2021: Pt. Edema has improved. Pt. Requires caregiver assist for positioning. 09/08/2021: Pt. Requires assist for positioning, ROM, edema control. Eval: No current HEPs  Goal status: Ongoing    LONG TERM GOALS: Target date: 01/22/2022    Pt. Will improve right hand edema by 1 cm at the wrist, and MCPs Baseline: 11/18/2021: Edema has improved overall  10/30/2021:  wrist: 17.5 cm, MCPs: 20 cm 09/08/2021: Wrist: 17.5 cm, MCPs: 20.5 cm Eval: Wrist: 18.5cm, MCPs: 21.5cm Goal status: Ongoing  2.  Pt. Will improve right shoulder PROM by 10 degrees with ADLs Baseline: 11/18/2021: shoulder flexion: 36, abduction:  58  10/30/2021: shoulder flexion: 15, abduction: 45 09/08/2021: Right shoulder flexion: 48, abduction: 53 Eval: PROM shoulder flexion: 30 degrees, Abduction: 40 degrees Goal status: Ongoing  3.  Pt. Will improve right elbow PROM by 10 degrees to prepare for hand to mouth patterns Baseline: 11/18/2021: -45 , flexion: 110 10/30/2021:elbow extension -72, flexion: 122 09/08/2021: elbow extension: -46, flexion: 110 Eval:   elbow extension -67, flexion 120 Goal status: Ongoing  4.  Pt. Will improve PROM right wrist extension by 10 degrees Baseline: 11/18/2021: wrist extension 50 10/30/2021: wrist extension 25 09/08/2021: Wrist extension: 40  Eval: wrist extension 12 degrees, flexion to neutral Goal status: Ongoing  5.  Pt. Will tolerate full digit extension in the right hand to promote good skin integrity through the palmar surface of the right hand.  Baseline: 11/18/2021: Pt. Keeps her right hand in tight flexor synergy. Pt. Intermittently tolerates  digit extension through 50% of the ROM. 10/30/2021: Pt. Tolerates ROM through 50% of  digit extension range, occasionally 75% 09/08/2021: Pt. Is able to tolerate PROM through 50% of digit extension in the digits. Eval: Pt. Maintains digits in tight flexion Goal status: Ongoing  6.  Pt. Will require minA UE dressing using one armed dressing techniques. Baseline: 11/18/2021: MinA-ModA UE dressing.  10/30/2021: Independent with button down shirt/sweater/cardigan, MaxA pullover shirt.09/08/2021: MaxA Eval: MaxA Goal status: Ongoing   ASSESSMENT:  CLINICAL IMPRESSION:  Pt.'s facial rash has improved. Pt. reports 3/10 pain in the RUE today.  Pt. was able to tolerate increased PROM today. Pt.'s right shoulder flexion, abduction, and digit extension ROM continues to be limited by pain today. Patient presents with increased flexor tone, stiffness, and tightness in the RUE. Pt. presents with increased flexor tightness in the right 2nd, and 3rd digits. Pt. continues to initially maintain the digits on her right hand in tight flexor synergy pattern. Pt. was able to tolerate soft tissue massage and ROM to the right hand, and digits. Pt. continues to work on improving pain, and improving RUE ROM in order to improve engagement with ADLs/IADLs, and promote good skin integrity through the palmar surface of the hand.    PERFORMANCE DEFICITS in functional skills including ADLs, IADLs, coordination, proprioception, sensation, edema, tone, ROM, strength, pain, FMC, GMC, balance, decreased knowledge of use of DME, skin integrity, and UE functional use, cognitive skills including attention, memory, and safety  awareness, and psychosocial skills including coping strategies.   IMPAIRMENTS are limiting patient from ADLs, IADLs, education, work, leisure, and social participation.   COMORBIDITIES may have co-morbidities  that affects occupational performance. Patient will benefit from skilled OT to address above impairments and improve overall  function.  MODIFICATION OR ASSISTANCE TO COMPLETE EVALUATION: Maximum modification of tasks or assist with assess necessary to complete an evaluation.  OT OCCUPATIONAL PROFILE AND HISTORY: Detailed assessment: Review of records and additional review of physical, cognitive, psychosocial history related to current functional performance.  CLINICAL DECISION MAKING: High - multiple treatment options, significant modification of task necessary  REHAB POTENTIAL: Good for stated goals  EVALUATION COMPLEXITY: High    PLAN: OT FREQUENCY: 2x/week  OT DURATION: 12 weeks  PLANNED INTERVENTIONS: self care/ADL training, therapeutic exercise, therapeutic activity, neuromuscular re-education, manual therapy, passive range of motion, paraffin, moist heat, contrast bath, patient/family education, cognitive remediation/compensation, energy conservation, and coping strategies training  RECOMMENDED OTHER SERVICES: PT, and ST  CONSULTED AND AGREED WITH PLAN OF CARE: Patient and family member/caregiver  PLAN FOR NEXT SESSION: Initiate PROM, and RUE Edema control techniques    Harrel Carina, MS, OTR/L   Harrel Carina, OT 12/02/2021, 3:30 PM

## 2021-12-02 NOTE — Therapy (Signed)
OUTPATIENT PHYSICAL THERAPY NEURO TREATMENT    Patient Name: Kelly Rollins MRN: [id number] DOB:12/04/1970, 51 y.o., female Today's Date: 12/02/2021   PCP: Kelly Rossetti, PA-C REFERRING PROVIDER: Elnora Rollins, Kelly Public, PA-C   PT End of Session - 12/02/21 1340     Visit Number 12    Number of Visits 25    Date for PT Re-Evaluation 12/09/21    Authorization Type [employer] Medicare    Authorization Time Period 09/16/21-12/09/21    PT Start Time 1348    PT Stop Time 1430    PT Time Calculation (min) 42 min    Equipment Utilized During Treatment Gait belt    Activity Tolerance Patient tolerated treatment well;Patient limited by fatigue    Behavior During Therapy [facility] for tasks assessed/performed                      Past Medical History:  Diagnosis Date   ABDOMINAL PAIN OTHER SPECIFIED SITE 02/21/2009   Qualifier: Diagnosis of  By: Kelly Medina MD, Kelly Rollins     ABDOMINAL PAIN-LUQ 04/17/2009   Qualifier: Diagnosis of  By: Kelly Plan MD Kelly Rollins    Anal fissure    Anginal pain ([place])    Asthma    Depression    Diabetes mellitus without complication ([place])    Dyspnea    Dysrhythmia    Headache(784.0)    HEMATOCHEZIA 04/17/2009   Qualifier: Diagnosis of  By: Kelly Plan MD Kelly Rollins    Hyperlipidemia    Hypertension    IBS (irritable bowel syndrome)    Migraine headache 11/06/2008   Qualifier: Diagnosis of  By: Kelly Medina MD, Kelly Rollins     OVARIAN CYST, RIGHT 02/21/2009   Qualifier: Diagnosis of  By: Kelly Medina MD, Kelly Rollins     PALPITATIONS, OCCASIONAL 04/17/2010   Qualifier: Diagnosis of  By: Kelly Medina MD, Kelly Rollins     Pancreatitis 10/14/2017   Past Surgical History:  Procedure Laterality Date   BREAST EXCISIONAL BIOPSY Left 1997   ? left side, done at time of reduction , benign   BREAST REDUCTION SURGERY     bilateral   CESAREAN SECTION     CHOLECYSTECTOMY     COLONOSCOPY     ESOPHAGOGASTRODUODENOSCOPY     ESOPHAGOGASTRODUODENOSCOPY (EGD) WITH PROPOFOL N/A 10/21/2020    Procedure: ESOPHAGOGASTRODUODENOSCOPY (EGD) WITH PROPOFOL;  Surgeon: Kelly Rubenstein, MD;  Location: [facility] ENDOSCOPY;  Service: Endoscopy;  Laterality: N/A;   REDUCTION MAMMAPLASTY Bilateral 1997   VAGINAL HYSTERECTOMY     Patient Active Problem List   Diagnosis Date Noted   Abnormal MRI, lumbar spine (01/29/2020) 02/19/2020   Anxiety due to MRI 01/25/2020   Claustrophobia associated to MRI 01/25/2020   Enthesopathy of knee region (Right) 01/25/2020   Enthesopathy of knee region (Left) 01/25/2020   Lumbar facet hypertrophy 01/25/2020   Lumbosacral radiculopathy at S1 (Left) 01/17/2020   Lumbosacral radiculopathy (S1) (Left) 01/17/2020   Chronic lower extremity pain (Bilateral) (L>R) 01/17/2020   History of pancreatitis 07/18/2019   Obstructive sleep apnea syndrome 01/02/2019   Lumbar facet arthropathy (Multilevel) (Bilateral) 12/14/2018   Osteoarthritis involving multiple joints 11/28/2018   Chronic hip pain (Left) 11/16/2018   Acute hip pain (Left) 11/16/2018   DDD (degenerative disc disease), lumbosacral 11/16/2018   Chronic hip pain (Secondary source of pain) (Bilateral) (L>R) 05/19/2017   Elevated C-reactive protein (CRP) 05/19/2017   Elevated sed rate 05/19/2017   Spondylosis without myelopathy or radiculopathy, lumbosacral region 05/19/2017   Pharmacologic therapy 05/06/2017  Disorder of skeletal system 05/06/2017   Problems influencing health status 05/06/2017   Chest pain with moderate risk of acute coronary syndrome 04/21/2017   DOE (dyspnea on exertion) 04/21/2017   Intermittent palpitations 04/21/2017   Vitamin D insufficiency 05/04/2016   Depression 02/04/2016   Fatty liver 02/04/2016   Hypertension associated with diabetes ([place]) 02/04/2016   Chronic pain syndrome 01/15/2016   Acute low back pain 05/28/2015   Lumbar transverse process fracture (HCC) (Left L1) (03/11/15) 05/28/2015   Long term current use of opiate analgesic 05/01/2015   Long term prescription  opiate use 05/01/2015   Opiate use (25 MME/Day) 05/01/2015   Encounter for therapeutic drug level monitoring 05/01/2015   Encounter for pain management planning 05/01/2015   Chronic low back pain (Primary Source of Pain) (Bilateral) (L>R) 05/01/2015   Lumbar facet syndrome (Bilateral) (L>R) 05/01/2015   Chronic knee pain [facility] source of pain) (Bilateral) (L>R) 05/01/2015   Lumbar spondylosis 05/01/2015   Neuropathic pain 05/01/2015   Neurogenic pain 05/01/2015   Myofascial pain 05/01/2015   Essential hypertriglyceridemia 04/26/2014   Type 2 diabetes mellitus with hyperglycemia, with long-term current use of insulin ([place]) 04/26/2014   Anxiety 05/15/2010   Obesity 05/15/2010   Hyperlipidemia 04/03/2010   Fatty liver disease 04/02/2010   Pure hypercholesterolemia 03/19/2010   GERD 04/17/2009   Constipation 04/17/2009   TOBACCO ABUSE 11/06/2008    ONSET DATE: January 2023  REFERRING DIAG: C71.9 (ICD-10-CM) - Malignant neoplasm of brain, unspecified  THERAPY DIAG:  Muscle weakness (generalized)  Other lack of coordination  Unsteadiness on feet  Difficulty in walking, not elsewhere classified  Rationale for Evaluation and Treatment Rehabilitation  SUBJECTIVE:                                                                                                                                                                                              SUBJECTIVE STATEMENT: Pt fatigued today. Had wound-care appt, says went well. No other current concerns/updates.   Pt accompanied by:  Kelly Rollins  PAIN:  Are you having pain? 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 Pt's husband, Kelly Rollins, present with pt. Kelly Rollins provides majority of hx as pt has difficulty with speech due to Broca's aphasia (working with  SLP). Pt reports partial paralysis of R side. She is unable to move her RUE, RLE also affected. Pt ambulates with hemiwalker in their home for short distances and does not use WC at home.  She fatigues quickly with ambulation. Pt has AFO for RLE but unable to determine how to donn it without it hurting her LE. Pt currently with wound on R calf covered by large bandage that they have been treating for at least a month (reports physician aware). In addition to wound on R calf, pt reports R side skin irritation under breast and lateral trunk due to bra underwire. They have since removed underwire. Pt was hospitalized 10 days in June due to UTI. She has had at least 12 falls in the last six months. Reports no injuries with falls, and states "just bumps and bruises." Pt reports she has constant head pain. Pt spouse reports pt has pain throughout her whole right side down to LE. Can have bad cramps in RUE and RLE. Reports no numbness/tingling. Occ dizziness, unable to describe further. Pt sleeps in recliner, wakes up stiff in the morning. PMH per chart includes: Anorexia, dehydration, Adult Failure to thrive, contracture of muscles multiple sites. Pt./caregiver reports recently being hospitalized with a UTI, and dehydration. Extensive hx, please refer to chart for full details.   PAIN: from eval Are you having pain? Yes: NPRS scale: 3/10 Pain location: Top of head, front Pain description: States, "cancer pain" head pain Aggravating factors: unsure Relieving factors: medications  PRECAUTIONS: Fall  WEIGHT BEARING RESTRICTIONS No  FALLS: Has patient fallen in last 6 months? Yes. Number of falls 12  LIVING ENVIRONMENT: information primarily from chart Lives with: lives with their spouse Lives in: House/apartment Stairs: Yes: External: 4 steps; bilateral but cannot reach both Has following equipment at home: Single point cane, Walker - 4 wheeled, Hemi walker, shower chair, bed side commode, Grab bars, and  lift chair  PLOF: Independent  PATIENT GOALS  "Normal function" as much as possible. Improving her balance.    TODAY'S TREATMENT:   TherEx-  STS from WC mod a with amb x  2 ft with hemiwalker 2x through from WC<> nustep, no greater than CGA provided  Nustep endurance training- Lvl 1 performed for 5 minutes total.SPM ranges from 26-36. Pt completes 0.08 miles. She rates intervention as medium.  PT provides assist with RLE positioning intermittently. Close CGA for mount/dismount.  In WC at mat table - STS 7 reps total pushing from WC (sitting on pillow).  - Pt performs 3 sustained stands for 20-30 sec. With 1 stand she performed trunk twists B and was cued for upright posture throughout.  Seated thoracic ext over backrest of WC 2x10 Seated L scapular squeeze 5x  Manual- Pt seated in WC, STM and TrP release along R UT, R posterior shoulder musculature and R cervical paraspinals. All performed within pain tolerance. PT additionally provided sustained R UT stretch 4x within pain-tolerance. Pt attempted assisted cervical rotation stretch to R, however, unable at this time. May benefit from attempt in supine.    PATIENT EDUCATION: Education details: Pt educated throughout session about proper posture and technique with exercises. Improved exercise technique, movement at target joints, use of target muscles after min to mod verbal, visual, tactile cues.   Person educated: pt and her spouse Education method:Explanation, Demonstration, Tactile cues, and Verbal cues, Education comprehension: verbalized understanding, returned demonstration, tactile cues required, and needs further education   HOME EXERCISE PROGRAM:  No updates today, pt to continue HEP as previously given  Access Code: [email or web address] URL: [email or web address] Date: 11/18/2021 Prepared by: Ricard Dillon  Exercises - Elbow Extension with Anchored Resistance  - 1 x daily - 5 x weekly -  3 sets - 10 reps  Access  Code: [email or web address] URL: [email or web address] Date: 10/02/2021 Prepared by: Sande Brothers  Exercises - Seated Hip Flexion March with Ankle Weights  - 1 x daily - 7 x weekly - 3 sets - 10 reps - Seated Long Arc Quad  - 1 x daily - 7 x weekly - 3 sets - 10 reps - Supine Quad Set  - 1 x daily - 7 x weekly - 3 sets - 10 reps - Proper Sit to Stand Technique  - 1 x daily - 7 x weekly - 3 sets - 10 reps  GOALS: Goals reviewed with patient? Yes  SHORT TERM GOALS: Target date: 10/28/2021  Patient will be independent in home exercise program to improve strength/mobility for better functional independence with ADLs. Baseline: to be initiated next 1-2 sessions; 10/17 previously issued, pt is  Goal status: INITIAL   LONG TERM GOALS: Target date: 12/09/2021  Patient will demo ability to perform at least 1 STS by pushing from [facility] with LUE instead of pulling to stand to improve functional mobility Baseline: Goal changed as pt FOTO tracked by OT, with new goal: pt currently pulls to stand Goal status: REVISED to new functional goal since OT tracking  2.   Patient (< 89 years old) will be able to perform and complete five times sit to stand test from standard chair height in <30 seconds to indicate increased LE strength and improved balance. Baseline: Pt able to complete only 1 stand, insufficient strength currently to complete full test; 10/17 31 seconds pull to stand with UE, close CGA-min a Goal status:  PARTIALLY MET  3.  Pt will improve LLE strength by at least 1/2 point on MMT in order to increase ease with transfers and functional mobility. Baseline: LLE grossly 4/5 throughout; 10/17: still generally 4/5 B, however now exhibits AROM with R hip flexion and knee ext. Goal status: IN PROGRESS  4.  Patient will increase 10 meter walk test to at least 0.80 m/s as to improve gait speed for better community ambulation and to reduce fall risk. Baseline: to be tested next 1-2 visits;  10/17: pt unable to perform Goal status: IN PROGRESS  5.  Pt will complete STS from standard height chair with UUE assist off armrests and with no greater than min assist to improve ease and safety with transfers. Baseline: mod a x2; 10/17: pulling to stand with LUE CGA Goal status: IN PROGRESS, Revised   ASSESSMENT:  CLINICAL IMPRESSION: Pt able to progress to more challenging variations of endurance training and STS training. She also shows ability to improve to a more upright posture in both seated and standing positions. Pt still with difficulty performing cervical rotation. She would benefit from further manual and stretching to these areas. Pt also awaiting referral for WC eval. The pt would benefit from continued skilled PT to address immobility, LE muscle weakness, and pain in order to decrease fall risk and increase functional mobility and QOL.   OBJECTIVE IMPAIRMENTS Abnormal gait, decreased activity tolerance, decreased balance, decreased coordination, decreased endurance, decreased mobility, difficulty walking, decreased ROM, decreased strength, hypomobility, increased edema, increased fascial restrictions, increased muscle spasms, impaired flexibility, impaired sensation, impaired tone, impaired UE functional use, improper body mechanics, postural dysfunction, and pain.   ACTIVITY LIMITATIONS carrying, lifting, bending, sitting, standing, squatting, stairs, transfers, bed mobility, bathing, toileting, dressing, reach over head, hygiene/grooming, locomotion level, and caring for others  PARTICIPATION LIMITATIONS: meal prep, cleaning, laundry, medication management, personal finances, driving,  shopping, community activity, occupation, and yard work  PERSONAL Education officer, community, Past/current experiences, Sex, and 3+ comorbidities: Anorexia, dehydration, Adult Failure to thrive, contracture of muscles multiple sites, multiple falls, UTI, extensive PMH refer to chart  are also affecting  patient's functional outcome.   REHAB POTENTIAL: Fair    CLINICAL DECISION MAKING: Evolving/moderate complexity  EVALUATION COMPLEXITY: High  Rollins: PT FREQUENCY: 2x/week  PT DURATION: 12 weeks  PLANNED INTERVENTIONS: Therapeutic exercises, Therapeutic activity, Neuromuscular re-education, Balance training, Gait training, Patient/Family education, Self Care, Joint mobilization, Joint manipulation, Stair training, Vestibular training, Canalith repositioning, Orthotic/Fit training, DME instructions, Wheelchair mobility training, Spinal mobilization, Cryotherapy, Moist heat, scar mobilization, Splintting, Taping, Manual therapy, and Re-evaluation  Rollins FOR NEXT SESSION:  stretching, strengthening, gait, balance, manual, continue Rollins 4:51 PM, 12/02/21   Zollie Pee, PT 12/02/2021, 4:51 PM

## 2021-12-03 ENCOUNTER — Encounter: Payer: Medicare Other | Admitting: Speech Pathology

## 2021-12-04 ENCOUNTER — Encounter: Payer: Medicare Other | Admitting: Speech Pathology

## 2021-12-04 ENCOUNTER — Ambulatory Visit: Payer: Medicare Other | Admitting: Occupational Therapy

## 2021-12-04 ENCOUNTER — Ambulatory Visit: Payer: Medicare Other

## 2021-12-09 ENCOUNTER — Ambulatory Visit: Payer: Medicare Other | Admitting: Occupational Therapy

## 2021-12-09 ENCOUNTER — Encounter: Payer: Medicare Other | Admitting: Speech Pathology

## 2021-12-09 ENCOUNTER — Ambulatory Visit: Payer: Medicare Other

## 2021-12-11 ENCOUNTER — Ambulatory Visit: Payer: Medicare Other

## 2021-12-11 ENCOUNTER — Encounter: Payer: Medicare Other | Admitting: Speech Pathology

## 2021-12-11 ENCOUNTER — Ambulatory Visit: Payer: Medicare Other | Admitting: Occupational Therapy

## 2021-12-16 ENCOUNTER — Ambulatory Visit: Payer: Medicare Other

## 2021-12-16 ENCOUNTER — Ambulatory Visit: Payer: Medicare Other | Admitting: Occupational Therapy

## 2021-12-18 ENCOUNTER — Ambulatory Visit: Payer: Medicare Other | Attending: Adult Health Nurse Practitioner

## 2021-12-18 ENCOUNTER — Encounter: Payer: Medicare Other | Admitting: Speech Pathology

## 2021-12-18 ENCOUNTER — Encounter: Payer: Self-pay | Admitting: Occupational Therapy

## 2021-12-18 ENCOUNTER — Ambulatory Visit: Payer: Medicare Other | Admitting: Occupational Therapy

## 2021-12-18 DIAGNOSIS — R278 Other lack of coordination: Secondary | ICD-10-CM | POA: Diagnosis present

## 2021-12-18 DIAGNOSIS — M6281 Muscle weakness (generalized): Secondary | ICD-10-CM | POA: Insufficient documentation

## 2021-12-18 DIAGNOSIS — R2681 Unsteadiness on feet: Secondary | ICD-10-CM | POA: Insufficient documentation

## 2021-12-18 DIAGNOSIS — R262 Difficulty in walking, not elsewhere classified: Secondary | ICD-10-CM | POA: Diagnosis present

## 2021-12-18 DIAGNOSIS — C719 Malignant neoplasm of brain, unspecified: Secondary | ICD-10-CM | POA: Insufficient documentation

## 2021-12-18 NOTE — Therapy (Signed)
Occupational Therapy Neuro Treatment Note  Patient Name: Kelly Rollins MRN: [id number] DOB:25-Jun-1970, 51 y.o., female 98 Date: 12/02/2021  PCP: [name], PA-C REFERRING PROVIDER: [name], Corrine Jeani Hawking, PA-C    OT End of Session - 12/02/21 1437     Visit Number 22    Number of Visits 48    Date for OT Re-Evaluation 01/22/22    Authorization Time Period Progress reporting period starting 09/08/2021    OT Start Time 1300    OT Stop Time 1345    OT Time Calculation (min) 45 min    Activity Tolerance Patient tolerated treatment well    Behavior During Therapy [facility] for tasks assessed/performed                    Past Medical History:  Diagnosis Date   ABDOMINAL PAIN OTHER SPECIFIED SITE 02/21/2009   Qualifier: Diagnosis of  By: Deborra Medina MD, Talia     ABDOMINAL PAIN-LUQ 04/17/2009   Qualifier: Diagnosis of  By: Fuller Plan MD Lamont Snowball T    Anal fissure    Anginal pain ([place])    Asthma    Depression    Diabetes mellitus without complication ([place])    Dyspnea    Dysrhythmia    Headache(784.0)    HEMATOCHEZIA 04/17/2009   Qualifier: Diagnosis of  By: Fuller Plan MD Lamont Snowball T    Hyperlipidemia    Hypertension    IBS (irritable bowel syndrome)    Migraine headache 11/06/2008   Qualifier: Diagnosis of  By: Deborra Medina MD, Talia     OVARIAN CYST, RIGHT 02/21/2009   Qualifier: Diagnosis of  By: Deborra Medina MD, Talia     PALPITATIONS, OCCASIONAL 04/17/2010   Qualifier: Diagnosis of  By: Deborra Medina MD, Talia     Pancreatitis 10/14/2017   Past Surgical History:  Procedure Laterality Date   BREAST EXCISIONAL BIOPSY Left 1997   ? left side, done at time of reduction , benign   BREAST REDUCTION SURGERY     bilateral   CESAREAN SECTION     CHOLECYSTECTOMY     COLONOSCOPY     ESOPHAGOGASTRODUODENOSCOPY     ESOPHAGOGASTRODUODENOSCOPY (EGD) WITH PROPOFOL N/A 10/21/2020   Procedure: ESOPHAGOGASTRODUODENOSCOPY (EGD) WITH PROPOFOL;  Surgeon: Lesly Rubenstein, MD;  Location:  [facility] ENDOSCOPY;  Service: Endoscopy;  Laterality: N/A;   REDUCTION MAMMAPLASTY Bilateral 1997   VAGINAL HYSTERECTOMY     Patient Active Problem List   Diagnosis Date Noted   Abnormal MRI, lumbar spine (01/29/2020) 02/19/2020   Anxiety due to MRI 01/25/2020   Claustrophobia associated to MRI 01/25/2020   Enthesopathy of knee region (Right) 01/25/2020   Enthesopathy of knee region (Left) 01/25/2020   Lumbar facet hypertrophy 01/25/2020   Lumbosacral radiculopathy at S1 (Left) 01/17/2020   Lumbosacral radiculopathy (S1) (Left) 01/17/2020   Chronic lower extremity pain (Bilateral) (L>R) 01/17/2020   History of pancreatitis 07/18/2019   Obstructive sleep apnea syndrome 01/02/2019   Lumbar facet arthropathy (Multilevel) (Bilateral) 12/14/2018   Osteoarthritis involving multiple joints 11/28/2018   Chronic hip pain (Left) 11/16/2018   Acute hip pain (Left) 11/16/2018   DDD (degenerative disc disease), lumbosacral 11/16/2018   Chronic hip pain (Secondary source of pain) (Bilateral) (L>R) 05/19/2017   Elevated C-reactive protein (CRP) 05/19/2017   Elevated sed rate 05/19/2017   Spondylosis without myelopathy or radiculopathy, lumbosacral region 05/19/2017   Pharmacologic therapy 05/06/2017   Disorder of skeletal system 05/06/2017   Problems influencing health status 05/06/2017   Chest pain with moderate  risk of acute coronary syndrome 04/21/2017   DOE (dyspnea on exertion) 04/21/2017   Intermittent palpitations 04/21/2017   Vitamin D insufficiency 05/04/2016   Depression 02/04/2016   Fatty liver 02/04/2016   Hypertension associated with diabetes ([place]) 02/04/2016   Chronic pain syndrome 01/15/2016   Acute low back pain 05/28/2015   Lumbar transverse process fracture (HCC) (Left L1) (03/11/15) 05/28/2015   Long term current use of opiate analgesic 05/01/2015   Long term prescription opiate use 05/01/2015   Opiate use (25 MME/Day) 05/01/2015   Encounter for therapeutic drug level  monitoring 05/01/2015   Encounter for pain management planning 05/01/2015   Chronic low back pain (Primary Source of Pain) (Bilateral) (L>R) 05/01/2015   Lumbar facet syndrome (Bilateral) (L>R) 05/01/2015   Chronic knee pain [facility] source of pain) (Bilateral) (L>R) 05/01/2015   Lumbar spondylosis 05/01/2015   Neuropathic pain 05/01/2015   Neurogenic pain 05/01/2015   Myofascial pain 05/01/2015   Essential hypertriglyceridemia 04/26/2014   Type 2 diabetes mellitus with hyperglycemia, with long-term current use of insulin ([place]) 04/26/2014   Anxiety 05/15/2010   Obesity 05/15/2010   Hyperlipidemia 04/03/2010   Fatty liver disease 04/02/2010   Pure hypercholesterolemia 03/19/2010   GERD 04/17/2009   Constipation 04/17/2009   TOBACCO ABUSE 11/06/2008    ONSET DATE: 02/06/2021  REFERRING DIAG: Astrocytoma  THERAPY DIAG:  Muscle weakness (generalized)  Rationale for Evaluation and Treatment Rehabilitation  SUBJECTIVE:     SUBJECTIVE STATEMENT:  Pt. Reports nausea today.    Pt accompanied by: significant other  PERTINENT HISTORY:  Pt. Is a 51 y.o. 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 PMHx includes: Anorexia, dehydration, Adult Failure to thrive, contracture of muscles multiple sites. Pt./caregiver reports recently being hospitalized with a UTI, and dehydration.   PRECAUTIONS: Fall  WEIGHT BEARING RESTRICTIONS No  PAIN:  Are you having pain?  Pt. reports 3/10 pain in the RUE  FALLS: Has patient fallen in last 6 months? Yes. Number of falls Multiple Falls  LIVING ENVIRONMENT: Lives with: Family Lives in: House/apartment Stairs: Yes: External: 4 steps; bilateral but cannot reach both Has following equipment at home: Single point cane, Walker - 4 wheeled, Hemi walker, shower chair, bed side commode, and Grab bars Lift chair in the tub, [place],  PLOF: Independent  PATIENT GOALS:  To improve the right UE  OBJECTIVE:   HAND DOMINANCE: Right  ADLs: Transfers/ambulation related to ADLs: Eating: MaxA with cutting food, setting up, Modified I with nondominant Left hand, [place]  Grooming: Independent  UB Dressing: MaxA  LB Dressing: MaxA Toileting: Uses a BSCommode mInA transfers, MaxA clothing negotiation Bathing: MaxA Tub Shower transfers:  Uses a shower lift chair Equipment:  w/c, BSCommode, cane   IADLs: Shopping: Husband performs Light housekeeping: Max Meal Prep: MaxA Community mobility: Relies on family Medication management:Husband assists Doctor, hospital: Performs together Handwriting: Not legible  MOBILITY STATUS: Hx of falls   ACTIVITY TOLERANCE: Activity tolerance: limited  FUNCTIONAL OUTCOME MEASURES: FOTO: 11.76  UPPER EXTREMITY ROM  : WFL Left AROM/PROM   Passive ROM Right eval Right  08/18/21 Right 09/08/2021 Right 10/30/2021 Right 11/18/2021  Shoulder flexion 30   48 15 36  Shoulder abduction 40 62 53 45 58  Shoulder adduction          Shoulder extension          Shoulder internal rotation  Shoulder external rotation          Elbow flexion 120   110 122 110  Elbow extension -67   -46 -72 -45  Wrist flexion Neutral   Neutral Neutral Neutral  Wrist extension 12   40 25 50  Wrist ulnar deviation          Wrist radial deviation          Wrist pronation          Wrist supination          (Blank rows = not tested)     UPPER EXTREMITY MMT:      MMT Right eval Left eval  Shoulder flexion 0/5 4+/5  Shoulder abduction 0/5 4+/5  Shoulder adduction      Shoulder extension      Shoulder internal rotation      Shoulder external rotation      Middle trapezius      Lower trapezius      Elbow flexion 0/5 4+/5  Elbow extension 0/5 4+/5  Wrist flexion      Wrist extension 0/5 4+/5  Wrist ulnar deviation      Wrist radial deviation      Wrist pronation      Wrist supination      (Blank rows = not tested)    FOTO score: 09/08/2021: 11   SENSATION: Light touch: WFL  EDEMA:   Wrist: 18.5 cm MCP: 21.5 cm  10/30/2021:  Wrist: 17.5 cm MCPs: 20cm    MUSCLE TONE: RUE: Severe  COGNITION: Overall cognitive status: Impaired  VISION: Subjective report: Wears glasses Baseline vision: Wears glasses all the time Pt. Reports no changes    PRAXIS: Impaired: Motor planning  TODAY'S TREATMENT:    Pt. tolerated PROM for shoulder flexion, and abduction.  Patient tolerated right wrist flexion and extension, radial and ulnar deviation, forearm supination/pronation, digit flexion and extension, and thumb abduction passive range of motion.  Pt./caregiver education was provided about positioning to increase progressive passive stretching of right shoulder abduction with pillows.  Manual Therapy:  Pt. Tolerated scapular mobilizations in elevation, depression, abduction, and rotation, and soft tissue massage to the right scapular region. Manual therapy was performed independent of, and in preparation for ROM.    Pt. Reports nausea today. Pt. reports having had 4 teeth extracted last week. Pt. Reports having had a rough time with it, and is just now starting to add solid foods back.  Pt. was able to tolerate increased PROM today in the right hand, and digits with less tightness. Pt.'s right shoulder flexion, abduction, and digit extension ROM continues to be limited by pain, and tightness. Patient presents with increased flexor tone, stiffness, and tightness in the RUE. Pt. presents with increased flexor tightness in the right 2nd, and 3rd digits. Pt. continues to initially maintain the digits on her right hand in tight flexor synergy pattern. Pt. was able to tolerate soft tissue massage and ROM to the right hand, and digits. Pt. continues to work on improving pain, and improving RUE ROM in order to improve engagement with ADLs/IADLs, and promote good skin integrity through the palmar surface of the  hand.  PATIENT EDUCATION: Education details: Right UE ROM, positioning of the RUE Person educated: Patient and Spouse Education method: Explanation, Demonstration, Tactile cues, and Verbal cues Education comprehension: verbalized understanding, returned demonstration, and needs further education   HOME EXERCISE PROGRAM:   Pt./caregiver education about positioning of the RUE, ROM    GOALS:  Goals reviewed with patient? Yes  SHORT TERM GOALS: Target date: 12/11/2021      Pt./caregivers will demonstrate independence with HEPs for UE ROM, and edema control Baseline:  11/18/2021: Edema has improved. 10/30/2021: Pt. Edema has improved. Pt. Requires caregiver assist for positioning. 09/08/2021: Pt. Requires assist for positioning, ROM, edema control. Eval: No current HEPs  Goal status: Ongoing    LONG TERM GOALS: Target date: 01/22/2022    Pt. Will improve right hand edema by 1 cm at the wrist, and MCPs Baseline: 11/18/2021: Edema has improved overall  10/30/2021:  wrist: 17.5 cm, MCPs: 20 cm 09/08/2021: Wrist: 17.5 cm, MCPs: 20.5 cm Eval: Wrist: 18.5cm, MCPs: 21.5cm Goal status: Ongoing  2.  Pt. Will improve right shoulder PROM by 10 degrees with ADLs Baseline: 11/18/2021: shoulder flexion: 36, abduction:  58  10/30/2021: shoulder flexion: 15, abduction: 45 09/08/2021: Right shoulder flexion: 48, abduction: 53 Eval: PROM shoulder flexion: 30 degrees, Abduction: 40 degrees Goal status: Ongoing  3.  Pt. Will improve right elbow PROM by 10 degrees to prepare for hand to mouth patterns Baseline: 11/18/2021: -45 , flexion: 110 10/30/2021:elbow extension -72, flexion: 122 09/08/2021: elbow extension: -46, flexion: 110 Eval:   elbow extension -67, flexion 120 Goal status: Ongoing  4.  Pt. Will improve PROM right wrist extension by 10 degrees Baseline: 11/18/2021: wrist extension 50 10/30/2021: wrist extension 25 09/08/2021: Wrist extension: 40 Eval: wrist extension 12 degrees, flexion to  neutral Goal status: Ongoing  5.  Pt. Will tolerate full digit extension in the right hand to promote good skin integrity through the palmar surface of the right hand.  Baseline: 11/18/2021: Pt. Keeps her right hand in tight flexor synergy. Pt. Intermittently tolerates  digit extension through 50% of the ROM. 10/30/2021: Pt. Tolerates ROM through 50% of  digit extension range, occasionally 75% 09/08/2021: Pt. Is able to tolerate PROM through 50% of digit extension in the digits. Eval: Pt. Maintains digits in tight flexion Goal status: Ongoing  6.  Pt. Will require minA UE dressing using one armed dressing techniques. Baseline: 11/18/2021: MinA-ModA UE dressing.  10/30/2021: Independent with button down shirt/sweater/cardigan, MaxA pullover shirt.09/08/2021: MaxA Eval: MaxA Goal status: Ongoing   ASSESSMENT:  CLINICAL IMPRESSION:  Pt. reports nausea today. Pt. reports having had 4 teeth extracted last week. Pt. Reports having had a rough time with it, and is just now starting to add solid foods back.  Pt. was able to tolerate increased PROM today in the right hand, and digits with less tightness. Pt.'s right shoulder flexion, abduction, and digit extension ROM continues to be limited by pain, and tightness. Patient presents with increased flexor tone, stiffness, and tightness in the RUE. Pt. presents with increased flexor tightness in the right 2nd, and 3rd digits. Pt. continues to initially maintain the digits on her right hand in tight flexor synergy pattern. Pt. was able to tolerate soft tissue massage and ROM to the right hand, and digits. Pt. continues to work on improving pain, and improving RUE ROM in order to improve engagement with ADLs/IADLs, and promote good skin integrity through the palmar surface of the hand.   PERFORMANCE DEFICITS in functional skills including ADLs, IADLs, coordination, proprioception, sensation, edema, tone, ROM, strength, pain, FMC, GMC, balance, decreased knowledge of  use of DME, skin integrity, and UE functional use, cognitive skills including attention, memory, and safety awareness, and psychosocial skills including coping strategies.   IMPAIRMENTS are limiting patient from ADLs, IADLs, education, work, leisure, and social participation.  COMORBIDITIES may have co-morbidities  that affects occupational performance. Patient will benefit from skilled OT to address above impairments and improve overall function.  MODIFICATION OR ASSISTANCE TO COMPLETE EVALUATION: Maximum modification of tasks or assist with assess necessary to complete an evaluation.  OT OCCUPATIONAL PROFILE AND HISTORY: Detailed assessment: Review of records and additional review of physical, cognitive, psychosocial history related to current functional performance.  CLINICAL DECISION MAKING: High - multiple treatment options, significant modification of task necessary  REHAB POTENTIAL: Good for stated goals  EVALUATION COMPLEXITY: High    PLAN: OT FREQUENCY: 2x/week  OT DURATION: 12 weeks  PLANNED INTERVENTIONS: self care/ADL training, therapeutic exercise, therapeutic activity, neuromuscular re-education, manual therapy, passive range of motion, paraffin, moist heat, contrast bath, patient/family education, cognitive remediation/compensation, energy conservation, and coping strategies training  RECOMMENDED OTHER SERVICES: PT, and ST  CONSULTED AND AGREED WITH PLAN OF CARE: Patient and family member/caregiver  PLAN FOR NEXT SESSION: Initiate PROM, and RUE Edema control techniques    Harrel Carina, MS, OTR/L   Harrel Carina, OT 12/02/2021, 3:30 PM

## 2021-12-18 NOTE — Therapy (Signed)
OUTPATIENT PHYSICAL THERAPY NEURO TREATMENT/RECERT    Patient Name: Kelly Rollins MRN: [id number] DOB:1970/08/07, 51 y.o., female Today's Date: 12/19/2021   PCP: [name], PA-C REFERRING PROVIDER: [name], Bonnell Public, PA-C   PT End of Session - 12/18/21 1056     Visit Number 13    Number of Visits 39    Date for PT Re-Evaluation 03/12/22    Authorization Type [employer] Medicare    Authorization Time Period 09/16/21-12/09/21    PT Start Time 1101    PT Stop Time 1131    PT Time Calculation (min) 30 min    Equipment Utilized During Treatment Gait belt    Activity Tolerance Patient tolerated treatment well;Patient limited by fatigue;Other (comment)   limited by nausea   Behavior During Therapy [facility] for tasks assessed/performed                      Past Medical History:  Diagnosis Date   ABDOMINAL PAIN OTHER SPECIFIED SITE 02/21/2009   Qualifier: Diagnosis of  By: Deborra Medina MD, Talia     ABDOMINAL PAIN-LUQ 04/17/2009   Qualifier: Diagnosis of  By: Fuller Plan MD Lamont Snowball T    Anal fissure    Anginal pain ([place])    Asthma    Depression    Diabetes mellitus without complication ([place])    Dyspnea    Dysrhythmia    Headache(784.0)    HEMATOCHEZIA 04/17/2009   Qualifier: Diagnosis of  By: Fuller Plan MD Lamont Snowball T    Hyperlipidemia    Hypertension    IBS (irritable bowel syndrome)    Migraine headache 11/06/2008   Qualifier: Diagnosis of  By: Deborra Medina MD, Talia     OVARIAN CYST, RIGHT 02/21/2009   Qualifier: Diagnosis of  By: Deborra Medina MD, Talia     PALPITATIONS, OCCASIONAL 04/17/2010   Qualifier: Diagnosis of  By: Deborra Medina MD, Talia     Pancreatitis 10/14/2017   Past Surgical History:  Procedure Laterality Date   BREAST EXCISIONAL BIOPSY Left 1997   ? left side, done at time of reduction , benign   BREAST REDUCTION SURGERY     bilateral   CESAREAN SECTION     CHOLECYSTECTOMY     COLONOSCOPY     ESOPHAGOGASTRODUODENOSCOPY     ESOPHAGOGASTRODUODENOSCOPY  (EGD) WITH PROPOFOL N/A 10/21/2020   Procedure: ESOPHAGOGASTRODUODENOSCOPY (EGD) WITH PROPOFOL;  Surgeon: Lesly Rubenstein, MD;  Location: [facility] ENDOSCOPY;  Service: Endoscopy;  Laterality: N/A;   REDUCTION MAMMAPLASTY Bilateral 1997   VAGINAL HYSTERECTOMY     Patient Active Problem List   Diagnosis Date Noted   Abnormal MRI, lumbar spine (01/29/2020) 02/19/2020   Anxiety due to MRI 01/25/2020   Claustrophobia associated to MRI 01/25/2020   Enthesopathy of knee region (Right) 01/25/2020   Enthesopathy of knee region (Left) 01/25/2020   Lumbar facet hypertrophy 01/25/2020   Lumbosacral radiculopathy at S1 (Left) 01/17/2020   Lumbosacral radiculopathy (S1) (Left) 01/17/2020   Chronic lower extremity pain (Bilateral) (L>R) 01/17/2020   History of pancreatitis 07/18/2019   Obstructive sleep apnea syndrome 01/02/2019   Lumbar facet arthropathy (Multilevel) (Bilateral) 12/14/2018   Osteoarthritis involving multiple joints 11/28/2018   Chronic hip pain (Left) 11/16/2018   Acute hip pain (Left) 11/16/2018   DDD (degenerative disc disease), lumbosacral 11/16/2018   Chronic hip pain (Secondary source of pain) (Bilateral) (L>R) 05/19/2017   Elevated C-reactive protein (CRP) 05/19/2017   Elevated sed rate 05/19/2017   Spondylosis without myelopathy or radiculopathy, lumbosacral region 05/19/2017  Pharmacologic therapy 05/06/2017   Disorder of skeletal system 05/06/2017   Problems influencing health status 05/06/2017   Chest pain with moderate risk of acute coronary syndrome 04/21/2017   DOE (dyspnea on exertion) 04/21/2017   Intermittent palpitations 04/21/2017   Vitamin D insufficiency 05/04/2016   Depression 02/04/2016   Fatty liver 02/04/2016   Hypertension associated with diabetes ([place]) 02/04/2016   Chronic pain syndrome 01/15/2016   Acute low back pain 05/28/2015   Lumbar transverse process fracture (HCC) (Left L1) (03/11/15) 05/28/2015   Long term current use of opiate analgesic  05/01/2015   Long term prescription opiate use 05/01/2015   Opiate use (25 MME/Day) 05/01/2015   Encounter for therapeutic drug level monitoring 05/01/2015   Encounter for pain management planning 05/01/2015   Chronic low back pain (Primary Source of Pain) (Bilateral) (L>R) 05/01/2015   Lumbar facet syndrome (Bilateral) (L>R) 05/01/2015   Chronic knee pain [facility] source of pain) (Bilateral) (L>R) 05/01/2015   Lumbar spondylosis 05/01/2015   Neuropathic pain 05/01/2015   Neurogenic pain 05/01/2015   Myofascial pain 05/01/2015   Essential hypertriglyceridemia 04/26/2014   Type 2 diabetes mellitus with hyperglycemia, with long-term current use of insulin ([place]) 04/26/2014   Anxiety 05/15/2010   Obesity 05/15/2010   Hyperlipidemia 04/03/2010   Fatty liver disease 04/02/2010   Pure hypercholesterolemia 03/19/2010   GERD 04/17/2009   Constipation 04/17/2009   TOBACCO ABUSE 11/06/2008    ONSET DATE: January 2023  REFERRING DIAG: C71.9 (ICD-10-CM) - Malignant neoplasm of brain, unspecified  THERAPY DIAG:  Muscle weakness (generalized)  Other lack of coordination  Rationale for Evaluation and Treatment Rehabilitation  SUBJECTIVE:                                                                                                                                                                                              SUBJECTIVE STATEMENT: Pt returns after procedure to have teeth pulled. Has had some difficulty eating since procedure, not feeling her best today. She is nervous about bending forward due to feeling pressure/pain in her jaw. Her spouse reports it has been 10 days since tooth extraction.   Pt accompanied by:  [name]  PAIN:  Are you having pain? 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 Pt's  husband, [name], present with pt. [name] provides majority of hx as pt has difficulty with speech due to Broca's aphasia (working with SLP). Pt reports partial paralysis of R side. She is  unable to move her RUE, RLE also affected. Pt ambulates with hemiwalker in their home for short distances and does not use WC at home. She fatigues quickly with ambulation. Pt has AFO for RLE but unable to determine how to donn it without it hurting her LE. Pt currently with wound on R calf covered by large bandage that they have been treating for at least a month (reports physician aware). In addition to wound on R calf, pt reports R side skin irritation under breast and lateral trunk due to bra underwire. They have since removed underwire. Pt was hospitalized 10 days in June due to UTI. She has had at least 12 falls in the last six months. Reports no injuries with falls, and states "just bumps and bruises." Pt reports she has constant head pain. Pt spouse reports pt has pain throughout her whole right side down to LE. Can have bad cramps in RUE and RLE. Reports no numbness/tingling. Occ dizziness, unable to describe further. Pt sleeps in recliner, wakes up stiff in the morning. PMH per chart includes: Anorexia, dehydration, Adult Failure to thrive, contracture of muscles multiple sites. Pt./caregiver reports recently being hospitalized with a UTI, and dehydration. Extensive hx, please refer to chart for full details.   PAIN: from eval Are you having pain? Yes: NPRS scale: 3/10 Pain location: Top of head, front Pain description: States, "cancer pain" head pain Aggravating factors: unsure Relieving factors: medications  PRECAUTIONS: Fall  WEIGHT BEARING RESTRICTIONS No  FALLS: Has patient fallen in last 6 months? Yes. Number of falls 12  LIVING ENVIRONMENT: information primarily from chart Lives with: lives with their spouse Lives in: House/apartment Stairs: Yes: External: 4 steps; bilateral but cannot reach  both Has following equipment at home: Single point cane, Walker - 4 wheeled, Hemi walker, shower chair, bed side commode, Grab bars, and lift chair  PLOF: Independent  PATIENT GOALS  "Normal function" as much as possible. Improving her balance.    TODAY'S TREATMENT:    [employer]Act- Goal retesting initiated on this date for recert. PT instructed pt throughout with goal reassessment technique and indications. However, majority of testing deferred due to pt with increased fatigue, pain, and nausea thinks is due to combo of chemo medication and recent dental procedure.   Manual: pt seated STM to R posterior shoulder mm, R UT and R cervical paraspinals with PT providing multiple bouts of long-duration stretch into L cerv lat flexion x several minutes. All performed within pain-tolerance. Pt reported stretch felt good.   Session ended early due to pt feeling nauseated. Spouse leaves to get pt's nausea medication, and PT monitors pt for remainder of session time and then brings pt to OT appointment, updates OT regarding pt status.       PATIENT EDUCATION: Education details: Pt educated throughout session about proper posture and technique with exercises. Improved exercise technique, movement at target joints, use of target muscles after min to mod verbal, visual, tactile cues. Goals  Person educated: pt and her spouse Education method:Explanation, Demonstration, Tactile cues, and Verbal cues, Education comprehension: verbalized understanding, returned demonstration, tactile cues required, and needs further education   HOME EXERCISE PROGRAM:  No updates today, pt to continue HEP as previously given  Access Code: [email or web address] URL: [email or web address] Date: 11/18/2021 Prepared by: Ricard Dillon  Exercises - Elbow Extension with Anchored Resistance  - 1 x daily - 5 x weekly - 3 sets - 10 reps  Access Code: [email or web address] URL: [email or web address] Date:  10/02/2021 Prepared by: Dellis Filbert  [place]  Exercises - Seated Hip Flexion March with Ankle Weights  - 1 x daily - 7 x weekly - 3 sets - 10 reps - Seated Long Arc Quad  - 1 x daily - 7 x weekly - 3 sets - 10 reps - Supine Quad Set  - 1 x daily - 7 x weekly - 3 sets - 10 reps - Proper Sit to Stand Technique  - 1 x daily - 7 x weekly - 3 sets - 10 reps  GOALS: Goals reviewed with patient? Yes  SHORT TERM GOALS: Target date: 01/29/2022   Patient will be independent in home exercise program to improve strength/mobility for better functional independence with ADLs. Baseline: to be initiated next 1-2 sessions; 10/17 previously issued, pt is 11/16: to be advanced as pt has gained strength since previous assessment  Goal status: ONGOING   LONG TERM GOALS: Target date: 03/12/2022   Patient will demo ability to perform at least 1 STS by pushing from [facility] with LUE instead of pulling to stand to improve functional mobility Baseline: Goal changed as pt FOTO tracked by OT, with new goal: pt currently pulls to stand; 11/16: deferred due to pt feeling unwell Goal status: ONGOING   2.   Patient (< 3 years old) will be able to perform and complete five times sit to stand test from standard chair height in <30 seconds to indicate increased LE strength and improved balance. Baseline: Pt able to complete only 1 stand, insufficient strength currently to complete full test; 10/17 31 seconds pull to stand with UE, close CGA-min a 11/16: deferred due to pt feeling unwell Goal status:  PARTIALLY MET  3.  Pt will improve LLE strength by at least 1/2 point on MMT in order to increase ease with transfers and functional mobility. Baseline: LLE grossly 4/5 throughout; 10/17: still generally 4/5 B, however now exhibits AROM with R hip flexion and knee ext; 11/16: 4+/5 LLE, RLE 3-/5, noted improvement however in quads and hip flexors  Goal status: MET  4.  Patient will increase 10 meter walk test to at least 0.80 m/s  as to improve gait speed for better community ambulation and to reduce fall risk. Baseline: to be tested next 1-2 visits; 10/17: pt unable to perform 11/16: deferred due to pt feeling unwell Goal status: IN PROGRESS  5.  Pt will complete STS from standard height chair with UUE assist off armrests and with no greater than min assist to improve ease and safety with transfers. Baseline: mod a x2; 10/17: pulling to stand with LUE CGA 11/16: deferred due to pt feeling unwell Goal status: IN PROGRESS,   6.  Pt will improve RLE strength by at least 1/2 point on MMT in order to increase ease with transfers and functional mobility. Baseline: 11/16: 4+/5 LLE, RLE 3-/5, noted improvement however in quads and hip flexors on R Goal status: NEW   ASSESSMENT:  CLINICAL IMPRESSION: Session somewhat limited today as pt became nauseated following manual therapy. Pt and spouse thinks may be de to medications/chemo and recent dental procedure still affecting pt. However, pt did meet her current LE MMT goal, and new goal to address RLE strength has now been added. Plan to retest remaining goals future visit as pt able. Patient's condition has the potential to improve in response to therapy. Maximum improvement is yet to be obtained. The anticipated improvement is attainable and reasonable in a generally predictable time.  The pt would benefit from continued skilled PT  to address immobility, LE muscle weakness, and pain in order to decrease fall risk and increase functional mobility and QOL.   OBJECTIVE IMPAIRMENTS Abnormal gait, decreased activity tolerance, decreased balance, decreased coordination, decreased endurance, decreased mobility, difficulty walking, decreased ROM, decreased strength, hypomobility, increased edema, increased fascial restrictions, increased muscle spasms, impaired flexibility, impaired sensation, impaired tone, impaired UE functional use, improper body mechanics, postural dysfunction, and  pain.   ACTIVITY LIMITATIONS carrying, lifting, bending, sitting, standing, squatting, stairs, transfers, bed mobility, bathing, toileting, dressing, reach over head, hygiene/grooming, locomotion level, and caring for others  PARTICIPATION LIMITATIONS: meal prep, cleaning, laundry, medication management, personal finances, driving, shopping, community activity, occupation, and yard work  PERSONAL Education officer, community, Past/current experiences, Sex, and 3+ comorbidities: Anorexia, dehydration, Adult Failure to thrive, contracture of muscles multiple sites, multiple falls, UTI, extensive PMH refer to chart  are also affecting patient's functional outcome.   REHAB POTENTIAL: Fair    CLINICAL DECISION MAKING: Evolving/moderate complexity  EVALUATION COMPLEXITY: High  PLAN: PT FREQUENCY: 2x/week  PT DURATION: 12 weeks  PLANNED INTERVENTIONS: Therapeutic exercises, Therapeutic activity, Neuromuscular re-education, Balance training, Gait training, Patient/Family education, Self Care, Joint mobilization, Joint manipulation, Stair training, Vestibular training, Canalith repositioning, Orthotic/Fit training, DME instructions, Wheelchair mobility training, Spinal mobilization, Cryotherapy, Moist heat, scar mobilization, Splintting, Taping, Manual therapy, and Re-evaluation  PLAN FOR NEXT SESSION:  stretching, strengthening, gait, balance, manual, continue plan 10:02 AM, 12/19/21   Zollie Pee, PT 12/19/2021, 10:02 AM

## 2021-12-23 ENCOUNTER — Encounter: Payer: Medicare Other | Admitting: Speech Pathology

## 2021-12-23 ENCOUNTER — Ambulatory Visit: Payer: Medicare Other | Admitting: Occupational Therapy

## 2021-12-23 ENCOUNTER — Ambulatory Visit: Payer: Medicare Other

## 2021-12-23 DIAGNOSIS — M6281 Muscle weakness (generalized): Secondary | ICD-10-CM

## 2021-12-23 DIAGNOSIS — R2681 Unsteadiness on feet: Secondary | ICD-10-CM

## 2021-12-23 DIAGNOSIS — R262 Difficulty in walking, not elsewhere classified: Secondary | ICD-10-CM

## 2021-12-23 DIAGNOSIS — R278 Other lack of coordination: Secondary | ICD-10-CM

## 2021-12-23 NOTE — Therapy (Signed)
OUTPATIENT PHYSICAL THERAPY NEURO TREATMENT    Patient Name: Kelly Rollins MRN: [id number] DOB:[birth year], 51 y.o., female Today's Date: 12/24/2021   PCP: Kelly Rossetti, PA-C REFERRING PROVIDER: Elnora Rollins, Kelly Public, PA-C   PT End of Session - 12/24/21 1108     Visit Number 14    Number of Visits 37    Date for PT Re-Evaluation 03/12/22    Authorization Type [employer] Medicare    Authorization Time Period 09/16/21-12/09/21    PT Start Time 1346    PT Stop Time 1424    PT Time Calculation (min) 38 min    Equipment Utilized During Treatment Gait belt    Activity Tolerance Patient tolerated treatment well;Patient limited by fatigue    Behavior During Therapy [facility] for tasks assessed/performed                      Past Medical History:  Diagnosis Date   ABDOMINAL PAIN OTHER SPECIFIED SITE 02/21/2009   Qualifier: Diagnosis of  By: Kelly Medina MD, Kelly Rollins     ABDOMINAL PAIN-LUQ 04/17/2009   Qualifier: Diagnosis of  By: Kelly Rollins    Anal fissure    Anginal pain ([place])    Asthma    Depression    Diabetes mellitus without complication ([place])    Dyspnea    Dysrhythmia    Headache(784.0)    HEMATOCHEZIA 04/17/2009   Qualifier: Diagnosis of  By: Kelly Rollins    Hyperlipidemia    Hypertension    IBS (irritable bowel syndrome)    Migraine headache 11/06/2008   Qualifier: Diagnosis of  By: Kelly Medina MD, Kelly Rollins     OVARIAN CYST, RIGHT 02/21/2009   Qualifier: Diagnosis of  By: Kelly Medina MD, Kelly Rollins     PALPITATIONS, OCCASIONAL 04/17/2010   Qualifier: Diagnosis of  By: Kelly Medina MD, Kelly Rollins     Pancreatitis 10/14/2017   Past Surgical History:  Procedure Laterality Date   BREAST EXCISIONAL BIOPSY Left 1997   ? left side, done at time of reduction , benign   BREAST REDUCTION SURGERY     bilateral   CESAREAN SECTION     CHOLECYSTECTOMY     COLONOSCOPY     ESOPHAGOGASTRODUODENOSCOPY     ESOPHAGOGASTRODUODENOSCOPY (EGD) WITH PROPOFOL N/A 10/21/2020    Procedure: ESOPHAGOGASTRODUODENOSCOPY (EGD) WITH PROPOFOL;  Surgeon: Kelly Rubenstein, MD;  Location: [facility] ENDOSCOPY;  Service: Endoscopy;  Laterality: N/A;   REDUCTION MAMMAPLASTY Bilateral 1997   VAGINAL HYSTERECTOMY     Patient Active Problem List   Diagnosis Date Noted   Abnormal MRI, lumbar spine (01/29/2020) 02/19/2020   Anxiety due to MRI 01/25/2020   Claustrophobia associated to MRI 01/25/2020   Enthesopathy of knee region (Right) 01/25/2020   Enthesopathy of knee region (Left) 01/25/2020   Lumbar facet hypertrophy 01/25/2020   Lumbosacral radiculopathy at S1 (Left) 01/17/2020   Lumbosacral radiculopathy (S1) (Left) 01/17/2020   Chronic lower extremity pain (Bilateral) (L>R) 01/17/2020   History of pancreatitis 07/18/2019   Obstructive sleep apnea syndrome 01/02/2019   Lumbar facet arthropathy (Multilevel) (Bilateral) 12/14/2018   Osteoarthritis involving multiple joints 11/28/2018   Chronic hip pain (Left) 11/16/2018   Acute hip pain (Left) 11/16/2018   DDD (degenerative disc disease), lumbosacral 11/16/2018   Chronic hip pain (Secondary source of pain) (Bilateral) (L>R) 05/19/2017   Elevated C-reactive protein (CRP) 05/19/2017   Elevated sed rate 05/19/2017   Spondylosis without myelopathy or radiculopathy, lumbosacral region 05/19/2017   Pharmacologic therapy 05/06/2017  Disorder of skeletal system 05/06/2017   Problems influencing health status 05/06/2017   Chest pain with moderate risk of acute coronary syndrome 04/21/2017   DOE (dyspnea on exertion) 04/21/2017   Intermittent palpitations 04/21/2017   Vitamin D insufficiency 05/04/2016   Depression 02/04/2016   Fatty liver 02/04/2016   Hypertension associated with diabetes ([place]) 02/04/2016   Chronic pain syndrome 01/15/2016   Acute low back pain 05/28/2015   Lumbar transverse process fracture (HCC) (Left L1) (03/11/15) 05/28/2015   Long term current use of opiate analgesic 05/01/2015   Long term prescription  opiate use 05/01/2015   Opiate use (25 MME/Day) 05/01/2015   Encounter for therapeutic drug level monitoring 05/01/2015   Encounter for pain management planning 05/01/2015   Chronic low back pain (Primary Source of Pain) (Bilateral) (L>R) 05/01/2015   Lumbar facet syndrome (Bilateral) (L>R) 05/01/2015   Chronic knee pain [facility] source of pain) (Bilateral) (L>R) 05/01/2015   Lumbar spondylosis 05/01/2015   Neuropathic pain 05/01/2015   Neurogenic pain 05/01/2015   Myofascial pain 05/01/2015   Essential hypertriglyceridemia 04/26/2014   Type 2 diabetes mellitus with hyperglycemia, with long-term current use of insulin ([place]) 04/26/2014   Anxiety 05/15/2010   Obesity 05/15/2010   Hyperlipidemia 04/03/2010   Fatty liver disease 04/02/2010   Pure hypercholesterolemia 03/19/2010   GERD 04/17/2009   Constipation 04/17/2009   TOBACCO ABUSE 11/06/2008    ONSET DATE: January 2023  REFERRING DIAG: C71.9 (ICD-10-CM) - Malignant neoplasm of brain, unspecified  THERAPY DIAG:  Muscle weakness (generalized)  Other lack of coordination  Unsteadiness on feet  Difficulty in walking, not elsewhere classified  Rationale for Evaluation and Treatment Rehabilitation  SUBJECTIVE:                                                                                                                                                                                              SUBJECTIVE STATEMENT: Pt feeling better today. Her husband reports she's eating little a better.They have brought in her tens unit she's been using.  Pt accompanied by:  Kelly Rollins  PAIN:  Are you having pain? 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 Pt's husband, Kelly Rollins, present with pt. Kelly Rollins provides majority of hx as pt has difficulty with speech due  to Broca's aphasia (working with SLP). Pt reports partial paralysis of R side. She is unable to move her RUE, RLE also affected. Pt ambulates with hemiwalker in their home for short distances  and does not use WC at home. She fatigues quickly with ambulation. Pt has AFO for RLE but unable to determine how to donn it without it hurting her LE. Pt currently with wound on R calf covered by large bandage that they have been treating for at least a month (reports physician aware). In addition to wound on R calf, pt reports R side skin irritation under breast and lateral trunk due to bra underwire. They have since removed underwire. Pt was hospitalized 10 days in June due to UTI. She has had at least 12 falls in the last six months. Reports no injuries with falls, and states "just bumps and bruises." Pt reports she has constant head pain. Pt spouse reports pt has pain throughout her whole right side down to LE. Can have bad cramps in RUE and RLE. Reports no numbness/tingling. Occ dizziness, unable to describe further. Pt sleeps in recliner, wakes up stiff in the morning. PMH per chart includes: Anorexia, dehydration, Adult Failure to thrive, contracture of muscles multiple sites. Pt./caregiver reports recently being hospitalized with a UTI, and dehydration. Extensive hx, please refer to chart for full details.   PAIN: from eval Are you having pain? Yes: NPRS scale: 3/10 Pain location: Top of head, front Pain description: States, "cancer pain" head pain Aggravating factors: unsure Relieving factors: medications  PRECAUTIONS: Fall  WEIGHT BEARING RESTRICTIONS No  FALLS: Has patient fallen in last 6 months? Yes. Number of falls 12  LIVING ENVIRONMENT: information primarily from chart Lives with: lives with their spouse Lives in: House/apartment Stairs: Yes: External: 4 steps; bilateral but cannot reach both Has following equipment at home: Single point cane, Walker - 4 wheeled, Hemi walker, shower chair,  bed side commode, Grab bars, and lift chair  PLOF: Independent  PATIENT GOALS  "Normal function" as much as possible. Improving her balance.    TODAY'S TREATMENT:   TherEx Reviewed and discussed safe use of tens unit, including where not to place pads, and not to place over areas of impaired sensation.  10MWT:  0.17 m/s with hemi walker, close CGA and wheelchair follow due to fall risk  4 STS throughout with pt pushing to stand, requires mod assist  Ambulated with hemiwalker, close CGA, WC follow 65 ft (initial for 10MWT), approx 158 ft  NMR Standing balance at support surface, WC behind pt, close CGA-min a throughout -[place] stacking and unstacking with twisting and reaching each direction x several minutes -Standing LTL weight shifts 10x each side  Pt requests rest break due to fatigue. Session ended to avoid excessive fatigue.     PATIENT EDUCATION: Education details: Pt educated throughout session about proper posture and technique with exercises. Improved exercise technique, movement at target joints, use of target muscles after min to mod verbal, visual, tactile cues. Goals  Person educated: pt and her spouse Education method:Explanation, Demonstration, Tactile cues, and Verbal cues, Education comprehension: verbalized understanding, returned demonstration, tactile cues required, and needs further education   HOME EXERCISE PROGRAM:  No updates today, pt to continue HEP as previously given  Access Code: [email or web address] URL: [email or web address] Date: 11/18/2021 Prepared by: Ricard Dillon  Exercises - Elbow Extension with Anchored Resistance  - 1 x daily - 5 x weekly - 3 sets - 10 reps  Access Code: [email or web address] URL: [email or web address] Date: 10/02/2021 Prepared by: Sande Brothers  Exercises - Seated Hip Flexion March with Ankle Weights  - 1 x daily - 7 x weekly - 3 sets - 10 reps - Seated Long Arc  Quad  - 1 x daily - 7 x weekly - 3 sets -  10 reps - Supine Quad Set  - 1 x daily - 7 x weekly - 3 sets - 10 reps - Proper Sit to Stand Technique  - 1 x daily - 7 x weekly - 3 sets - 10 reps  GOALS: Goals reviewed with patient? Yes  SHORT TERM GOALS: Target date: 01/29/2022   Patient will be independent in home exercise program to improve strength/mobility for better functional independence with ADLs. Baseline: to be initiated next 1-2 sessions; 10/17 previously issued, pt is 11/16: to be advanced as pt has gained strength since previous assessment  Goal status: ONGOING   LONG TERM GOALS: Target date: 03/12/2022   Patient will demo ability to perform at least 1 STS by pushing from [facility] with LUE instead of pulling to stand to improve functional mobility Baseline: Goal changed as pt FOTO tracked by OT, with new goal: pt currently pulls to stand; 11/16: deferred due to pt feeling unwell; 11/22: able to complete with mod assist Goal status: ONGOING   2.   Patient (< 36 years old) will be able to perform and complete five times sit to stand test from standard chair height in <30 seconds to indicate increased LE strength and improved balance. Baseline: Pt able to complete only 1 stand, insufficient strength currently to complete full test; 10/17 31 seconds pull to stand with UE, close CGA-min a 11/16: deferred due to pt feeling unwell Goal status:  PARTIALLY MET  3.  Pt will improve LLE strength by at least 1/2 point on MMT in order to increase ease with transfers and functional mobility. Baseline: LLE grossly 4/5 throughout; 10/17: still generally 4/5 B, however now exhibits AROM with R hip flexion and knee ext; 11/16: 4+/5 LLE, RLE 3-/5, noted improvement however in quads and hip flexors  Goal status: MET  4.  Patient will increase 10 meter walk test to at least 0.80 m/s as to improve gait speed for better community ambulation and to reduce fall risk. Baseline: to be tested next 1-2 visits; 10/17: pt unable to perform 11/16: deferred  due to pt feeling unwell; 11/21: 0.17 m/s Goal status: IN PROGRESS  5.  Pt will complete STS from standard height chair with UUE assist off armrests and with no greater than min assist to improve ease and safety with transfers. Baseline: mod a x2; 10/17: pulling to stand with LUE CGA 11/16: deferred due to pt feeling unwell; 11/21: currently requires mod a to complete Goal status: IN PROGRESS,   6.  Pt will improve RLE strength by at least 1/2 point on MMT in order to increase ease with transfers and functional mobility. Baseline: 11/16: 4+/5 LLE, RLE 3-/5, noted improvement however in quads and hip flexors on R Goal status: NEW   ASSESSMENT:  CLINICAL IMPRESSION: Pt shows progress by completing 10MWT today and additional ambulation with hemi-walker, WC and close CGA throughout (do to high fall risk). Pt also then able to complete standing balance tasks involving reaching and stacking/unstacking cones. This indicates improved LE strength, endurance, balance and general functional mobility. While pt does show progress, session then ended early due to fatigue. The pt would benefit from continued skilled PT to address immobility, LE muscle weakness, and pain in order to decrease fall risk and increase functional mobility and QOL.   OBJECTIVE IMPAIRMENTS Abnormal gait, decreased activity tolerance, decreased balance, decreased coordination, decreased endurance, decreased mobility, difficulty walking, decreased ROM,  decreased strength, hypomobility, increased edema, increased fascial restrictions, increased muscle spasms, impaired flexibility, impaired sensation, impaired tone, impaired UE functional use, improper body mechanics, postural dysfunction, and pain.   ACTIVITY LIMITATIONS carrying, lifting, bending, sitting, standing, squatting, stairs, transfers, bed mobility, bathing, toileting, dressing, reach over head, hygiene/grooming, locomotion level, and caring for others  PARTICIPATION  LIMITATIONS: meal prep, cleaning, laundry, medication management, personal finances, driving, shopping, community activity, occupation, and yard work  PERSONAL Education officer, community, Past/current experiences, Sex, and 3+ comorbidities: Anorexia, dehydration, Adult Failure to thrive, contracture of muscles multiple sites, multiple falls, UTI, extensive PMH refer to chart  are also affecting patient's functional outcome.   REHAB POTENTIAL: Fair    CLINICAL DECISION MAKING: Evolving/moderate complexity  EVALUATION COMPLEXITY: High  PLAN: PT FREQUENCY: 2x/week  PT DURATION: 12 weeks  PLANNED INTERVENTIONS: Therapeutic exercises, Therapeutic activity, Neuromuscular re-education, Balance training, Gait training, Patient/Family education, Self Care, Joint mobilization, Joint manipulation, Stair training, Vestibular training, Canalith repositioning, Orthotic/Fit training, DME instructions, Wheelchair mobility training, Spinal mobilization, Cryotherapy, Moist heat, scar mobilization, Splintting, Taping, Manual therapy, and Re-evaluation  PLAN FOR NEXT SESSION:  stretching, strengthening, gait, balance, manual, continue plan 11:16 AM, 12/24/21   Zollie Pee, PT 12/24/2021, 11:16 AM

## 2021-12-23 NOTE — Therapy (Signed)
Occupational Therapy Neuro Treatment Note  Patient Name: Kelly Rollins MRN: [id number] DOB:[birth year], 51 y.o., female 56 Date: 12/02/2021  PCP: [name], PA-C REFERRING PROVIDER: [name], Corrine Jeani Hawking, PA-C    OT End of Session - 12/23/21 1754     Visit Number 24    Number of Visits 44    Date for OT Re-Evaluation 01/22/22    Authorization Time Period Progress reporting period starting 09/08/2021    OT Start Time 1303    OT Stop Time 1345    OT Time Calculation (min) 42 min    Activity Tolerance Patient tolerated treatment well    Behavior During Therapy [facility] for tasks assessed/performed                      Past Medical History:  Diagnosis Date   ABDOMINAL PAIN OTHER SPECIFIED SITE 02/21/2009   Qualifier: Diagnosis of  By: Deborra Medina MD, Talia     ABDOMINAL PAIN-LUQ 04/17/2009   Qualifier: Diagnosis of  By: Fuller Plan MD Lamont Snowball T    Anal fissure    Anginal pain ([place])    Asthma    Depression    Diabetes mellitus without complication ([place])    Dyspnea    Dysrhythmia    Headache(784.0)    HEMATOCHEZIA 04/17/2009   Qualifier: Diagnosis of  By: Fuller Plan MD Lamont Snowball T    Hyperlipidemia    Hypertension    IBS (irritable bowel syndrome)    Migraine headache 11/06/2008   Qualifier: Diagnosis of  By: Deborra Medina MD, Talia     OVARIAN CYST, RIGHT 02/21/2009   Qualifier: Diagnosis of  By: Deborra Medina MD, Talia     PALPITATIONS, OCCASIONAL 04/17/2010   Qualifier: Diagnosis of  By: Deborra Medina MD, Talia     Pancreatitis 10/14/2017   Past Surgical History:  Procedure Laterality Date   BREAST EXCISIONAL BIOPSY Left 1997   ? left side, done at time of reduction , benign   BREAST REDUCTION SURGERY     bilateral   CESAREAN SECTION     CHOLECYSTECTOMY     COLONOSCOPY     ESOPHAGOGASTRODUODENOSCOPY     ESOPHAGOGASTRODUODENOSCOPY (EGD) WITH PROPOFOL N/A 10/21/2020   Procedure: ESOPHAGOGASTRODUODENOSCOPY (EGD) WITH PROPOFOL;  Surgeon: Lesly Rubenstein, MD;  Location:  [facility] ENDOSCOPY;  Service: Endoscopy;  Laterality: N/A;   REDUCTION MAMMAPLASTY Bilateral 1997   VAGINAL HYSTERECTOMY     Patient Active Problem List   Diagnosis Date Noted   Abnormal MRI, lumbar spine (01/29/2020) 02/19/2020   Anxiety due to MRI 01/25/2020   Claustrophobia associated to MRI 01/25/2020   Enthesopathy of knee region (Right) 01/25/2020   Enthesopathy of knee region (Left) 01/25/2020   Lumbar facet hypertrophy 01/25/2020   Lumbosacral radiculopathy at S1 (Left) 01/17/2020   Lumbosacral radiculopathy (S1) (Left) 01/17/2020   Chronic lower extremity pain (Bilateral) (L>R) 01/17/2020   History of pancreatitis 07/18/2019   Obstructive sleep apnea syndrome 01/02/2019   Lumbar facet arthropathy (Multilevel) (Bilateral) 12/14/2018   Osteoarthritis involving multiple joints 11/28/2018   Chronic hip pain (Left) 11/16/2018   Acute hip pain (Left) 11/16/2018   DDD (degenerative disc disease), lumbosacral 11/16/2018   Chronic hip pain (Secondary source of pain) (Bilateral) (L>R) 05/19/2017   Elevated C-reactive protein (CRP) 05/19/2017   Elevated sed rate 05/19/2017   Spondylosis without myelopathy or radiculopathy, lumbosacral region 05/19/2017   Pharmacologic therapy 05/06/2017   Disorder of skeletal system 05/06/2017   Problems influencing health status 05/06/2017   Chest pain  with moderate risk of acute coronary syndrome 04/21/2017   DOE (dyspnea on exertion) 04/21/2017   Intermittent palpitations 04/21/2017   Vitamin D insufficiency 05/04/2016   Depression 02/04/2016   Fatty liver 02/04/2016   Hypertension associated with diabetes ([place]) 02/04/2016   Chronic pain syndrome 01/15/2016   Acute low back pain 05/28/2015   Lumbar transverse process fracture (HCC) (Left L1) (03/11/15) 05/28/2015   Long term current use of opiate analgesic 05/01/2015   Long term prescription opiate use 05/01/2015   Opiate use (25 MME/Day) 05/01/2015   Encounter for therapeutic drug level  monitoring 05/01/2015   Encounter for pain management planning 05/01/2015   Chronic low back pain (Primary Source of Pain) (Bilateral) (L>R) 05/01/2015   Lumbar facet syndrome (Bilateral) (L>R) 05/01/2015   Chronic knee pain [facility] source of pain) (Bilateral) (L>R) 05/01/2015   Lumbar spondylosis 05/01/2015   Neuropathic pain 05/01/2015   Neurogenic pain 05/01/2015   Myofascial pain 05/01/2015   Essential hypertriglyceridemia 04/26/2014   Type 2 diabetes mellitus with hyperglycemia, with long-term current use of insulin ([place]) 04/26/2014   Anxiety 05/15/2010   Obesity 05/15/2010   Hyperlipidemia 04/03/2010   Fatty liver disease 04/02/2010   Pure hypercholesterolemia 03/19/2010   GERD 04/17/2009   Constipation 04/17/2009   TOBACCO ABUSE 11/06/2008    ONSET DATE: 02/06/2021  REFERRING DIAG: Astrocytoma  THERAPY DIAG:  Muscle weakness (generalized)  Rationale for Evaluation and Treatment Rehabilitation  SUBJECTIVE:     SUBJECTIVE STATEMENT:  Pt. Reports nausea today.    Pt accompanied by: significant other  PERTINENT HISTORY:  Pt. Is a 51 y.o. 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 PMHx includes: Anorexia, dehydration, Adult Failure to thrive, contracture of muscles multiple sites. Pt./caregiver reports recently being hospitalized with a UTI, and dehydration.   PRECAUTIONS: Fall  WEIGHT BEARING RESTRICTIONS No  PAIN:  Are you having pain?  Pt. reports 3/10 pain in the RUE  FALLS: Has patient fallen in last 6 months? Yes. Number of falls Multiple Falls  LIVING ENVIRONMENT: Lives with: Family Lives in: House/apartment Stairs: Yes: External: 4 steps; bilateral but cannot reach both Has following equipment at home: Single point cane, Walker - 4 wheeled, Hemi walker, shower chair, bed side commode, and Grab bars Lift chair in the tub, [place],  PLOF: Independent  PATIENT GOALS:  To improve the right UE  OBJECTIVE:   HAND DOMINANCE: Right  ADLs: Transfers/ambulation related to ADLs: Eating: MaxA with cutting food, setting up, Modified I with nondominant Left hand, [place]  Grooming: Independent  UB Dressing: MaxA  LB Dressing: MaxA Toileting: Uses a BSCommode mInA transfers, MaxA clothing negotiation Bathing: MaxA Tub Shower transfers:  Uses a shower lift chair Equipment:  w/c, BSCommode, cane   IADLs: Shopping: Husband performs Light housekeeping: Max Meal Prep: MaxA Community mobility: Relies on family Medication management:Husband assists Doctor, hospital: Performs together Handwriting: Not legible  MOBILITY STATUS: Hx of falls   ACTIVITY TOLERANCE: Activity tolerance: limited  FUNCTIONAL OUTCOME MEASURES: FOTO: 11.76  UPPER EXTREMITY ROM  : WFL Left AROM/PROM   Passive ROM Right eval Right  08/18/21 Right 09/08/2021 Right 10/30/2021 Right 11/18/2021  Shoulder flexion 30   48 15 36  Shoulder abduction 40 62 53 45 58  Shoulder adduction          Shoulder extension          Shoulder internal rotation  Shoulder external rotation          Elbow flexion 120   110 122 110  Elbow extension -67   -46 -72 -45  Wrist flexion Neutral   Neutral Neutral Neutral  Wrist extension 12   40 25 50  Wrist ulnar deviation          Wrist radial deviation          Wrist pronation          Wrist supination          (Blank rows = not tested)     UPPER EXTREMITY MMT:      MMT Right eval Left eval  Shoulder flexion 0/5 4+/5  Shoulder abduction 0/5 4+/5  Shoulder adduction      Shoulder extension      Shoulder internal rotation      Shoulder external rotation      Middle trapezius      Lower trapezius      Elbow flexion 0/5 4+/5  Elbow extension 0/5 4+/5  Wrist flexion      Wrist extension 0/5 4+/5  Wrist ulnar deviation      Wrist radial deviation      Wrist pronation      Wrist supination      (Blank rows = not tested)    FOTO score: 09/08/2021: 11   SENSATION: Light touch: WFL  EDEMA:   Wrist: 18.5 cm MCP: 21.5 cm  10/30/2021:  Wrist: 17.5 cm MCPs: 20cm    MUSCLE TONE: RUE: Severe  COGNITION: Overall cognitive status: Impaired  VISION: Subjective report: Wears glasses Baseline vision: Wears glasses all the time Pt. Reports no changes    PRAXIS: Impaired: Motor planning  TODAY'S TREATMENT:    Pt. tolerated PROM for shoulder flexion, and abduction.  Patient tolerated right wrist flexion and extension, radial and ulnar deviation, forearm supination/pronation, digit flexion and extension, and thumb abduction passive range of motion.  Pt./caregiver education was provided about positioning to increase progressive passive stretching of right shoulder abduction with pillows.  Manual Therapy:  Pt. Tolerated scapular mobilizations in elevation, depression, abduction, and rotation, and soft tissue massage to the right scapular region. Manual therapy was performed independent of, and in preparation for ROM.    Pt. Reports feeling much better after having had her teeth extracted.  Pt. was able to tolerate increased PROM today in the right shoulder abduction in the available pain free range, right hand, and digits with less tightness. Pt.'s right shoulder flexion, abduction, and digit extension ROM continues to be limited by pain, and tightness. Patient continues to present with increased flexor tone, stiffness, and tightness in the RUE. Pt. continues to initially maintain the digits on her right hand in tight flexor synergy pattern. Pt. was able to tolerate soft tissue massage and ROM to the right hand, and digits. Pt. continues to work on improving pain, and improving RUE ROM in order to improve engagement with ADLs/IADLs, and promote good skin integrity through the palmar surface of the hand.  PATIENT EDUCATION: Education details: Right UE ROM, positioning of the RUE Person educated: Patient and  Spouse Education method: Explanation, Demonstration, Tactile cues, and Verbal cues Education comprehension: verbalized understanding, returned demonstration, and needs further education   HOME EXERCISE PROGRAM:   Pt./caregiver education about positioning of the RUE, ROM    GOALS: Goals reviewed with patient? Yes  SHORT TERM GOALS: Target date: 12/11/2021      Pt./caregivers will demonstrate independence with HEPs  for UE ROM, and edema control Baseline:  11/18/2021: Edema has improved. 10/30/2021: Pt. Edema has improved. Pt. Requires caregiver assist for positioning. 09/08/2021: Pt. Requires assist for positioning, ROM, edema control. Eval: No current HEPs  Goal status: Ongoing    LONG TERM GOALS: Target date: 01/22/2022    Pt. Will improve right hand edema by 1 cm at the wrist, and MCPs Baseline: 11/18/2021: Edema has improved overall  10/30/2021:  wrist: 17.5 cm, MCPs: 20 cm 09/08/2021: Wrist: 17.5 cm, MCPs: 20.5 cm Eval: Wrist: 18.5cm, MCPs: 21.5cm Goal status: Ongoing  2.  Pt. Will improve right shoulder PROM by 10 degrees with ADLs Baseline: 11/18/2021: shoulder flexion: 36, abduction:  58  10/30/2021: shoulder flexion: 15, abduction: 45 09/08/2021: Right shoulder flexion: 48, abduction: 53 Eval: PROM shoulder flexion: 30 degrees, Abduction: 40 degrees Goal status: Ongoing  3.  Pt. Will improve right elbow PROM by 10 degrees to prepare for hand to mouth patterns Baseline: 11/18/2021: -45 , flexion: 110 10/30/2021:elbow extension -72, flexion: 122 09/08/2021: elbow extension: -46, flexion: 110 Eval:   elbow extension -67, flexion 120 Goal status: Ongoing  4.  Pt. Will improve PROM right wrist extension by 10 degrees Baseline: 11/18/2021: wrist extension 50 10/30/2021: wrist extension 25 09/08/2021: Wrist extension: 40 Eval: wrist extension 12 degrees, flexion to neutral Goal status: Ongoing  5.  Pt. Will tolerate full digit extension in the right hand to promote good skin  integrity through the palmar surface of the right hand.  Baseline: 11/18/2021: Pt. Keeps her right hand in tight flexor synergy. Pt. Intermittently tolerates  digit extension through 50% of the ROM. 10/30/2021: Pt. Tolerates ROM through 50% of  digit extension range, occasionally 75% 09/08/2021: Pt. Is able to tolerate PROM through 50% of digit extension in the digits. Eval: Pt. Maintains digits in tight flexion Goal status: Ongoing  6.  Pt. Will require minA UE dressing using one armed dressing techniques. Baseline: 11/18/2021: MinA-ModA UE dressing.  10/30/2021: Independent with button down shirt/sweater/cardigan, MaxA pullover shirt.09/08/2021: MaxA Eval: MaxA Goal status: Ongoing   ASSESSMENT:  CLINICAL IMPRESSION:  Pt. Reports feeling much better after having had her teeth extracted.  Pt. was able to tolerate increased PROM today in the right shoulder abduction in the available pain free range, right hand, and digits with less tightness. Pt.'s right shoulder flexion, abduction, and digit extension ROM continues to be limited by pain, and tightness. Patient continues to present with increased flexor tone, stiffness, and tightness in the RUE. Pt. continues to initially maintain the digits on her right hand in tight flexor synergy pattern. Pt. was able to tolerate soft tissue massage and ROM to the right hand, and digits. Pt. continues to work on improving pain, and improving RUE ROM in order to improve engagement with ADLs/IADLs, and promote good skin integrity through the palmar surface of the hand.   PERFORMANCE DEFICITS in functional skills including ADLs, IADLs, coordination, proprioception, sensation, edema, tone, ROM, strength, pain, FMC, GMC, balance, decreased knowledge of use of DME, skin integrity, and UE functional use, cognitive skills including attention, memory, and safety awareness, and psychosocial skills including coping strategies.   IMPAIRMENTS are limiting patient from ADLs,  IADLs, education, work, leisure, and social participation.   COMORBIDITIES may have co-morbidities  that affects occupational performance. Patient will benefit from skilled OT to address above impairments and improve overall function.  MODIFICATION OR ASSISTANCE TO COMPLETE EVALUATION: Maximum modification of tasks or assist with assess necessary to complete an evaluation.  OT OCCUPATIONAL  PROFILE AND HISTORY: Detailed assessment: Review of records and additional review of physical, cognitive, psychosocial history related to current functional performance.  CLINICAL DECISION MAKING: High - multiple treatment options, significant modification of task necessary  REHAB POTENTIAL: Good for stated goals  EVALUATION COMPLEXITY: High    PLAN: OT FREQUENCY: 2x/week  OT DURATION: 12 weeks  PLANNED INTERVENTIONS: self care/ADL training, therapeutic exercise, therapeutic activity, neuromuscular re-education, manual therapy, passive range of motion, paraffin, moist heat, contrast bath, patient/family education, cognitive remediation/compensation, energy conservation, and coping strategies training  RECOMMENDED OTHER SERVICES: PT, and ST  CONSULTED AND AGREED WITH PLAN OF CARE: Patient and family member/caregiver  PLAN FOR NEXT SESSION: Initiate PROM, and RUE Edema control techniques    Harrel Carina, MS, OTR/L   Harrel Carina, OT 12/02/2021, 3:30 PM

## 2021-12-30 ENCOUNTER — Ambulatory Visit: Payer: Medicare Other

## 2021-12-30 ENCOUNTER — Encounter: Payer: Medicare Other | Admitting: Speech Pathology

## 2021-12-30 DIAGNOSIS — R278 Other lack of coordination: Secondary | ICD-10-CM

## 2021-12-30 DIAGNOSIS — M6281 Muscle weakness (generalized): Secondary | ICD-10-CM

## 2021-12-30 DIAGNOSIS — C719 Malignant neoplasm of brain, unspecified: Secondary | ICD-10-CM

## 2021-12-30 DIAGNOSIS — R2681 Unsteadiness on feet: Secondary | ICD-10-CM

## 2021-12-30 DIAGNOSIS — R262 Difficulty in walking, not elsewhere classified: Secondary | ICD-10-CM

## 2021-12-30 NOTE — Therapy (Signed)
OUTPATIENT PHYSICAL THERAPY NEURO TREATMENT    Patient Name: Kelly Rollins MRN: [id number] DOB:August 26, 1970, 51 y.o., female Today's Date: 12/30/2021   PCP: [name], PA-C REFERRING PROVIDER: [name], Bonnell Public, PA-C   PT End of Session - 12/30/21 1348     Visit Number 15    Number of Visits 37    Date for PT Re-Evaluation 03/12/22    Authorization Type [employer] Medicare    Authorization Time Period 09/16/21-12/09/21    PT Start Time 1348    PT Stop Time 1430    PT Time Calculation (min) 42 min    Equipment Utilized During Treatment Gait belt    Activity Tolerance Patient tolerated treatment well    Behavior During Therapy WFL for tasks assessed/performed                      Past Medical History:  Diagnosis Date   ABDOMINAL PAIN OTHER SPECIFIED SITE 02/21/2009   Qualifier: Diagnosis of  By: Deborra Medina MD, Talia     ABDOMINAL PAIN-LUQ 04/17/2009   Qualifier: Diagnosis of  By: Fuller Plan MD Lamont Snowball T    Anal fissure    Anginal pain ([place])    Asthma    Depression    Diabetes mellitus without complication ([place])    Dyspnea    Dysrhythmia    Headache(784.0)    HEMATOCHEZIA 04/17/2009   Qualifier: Diagnosis of  By: Fuller Plan MD Lamont Snowball T    Hyperlipidemia    Hypertension    IBS (irritable bowel syndrome)    Migraine headache 11/06/2008   Qualifier: Diagnosis of  By: Deborra Medina MD, Talia     OVARIAN CYST, RIGHT 02/21/2009   Qualifier: Diagnosis of  By: Deborra Medina MD, Talia     PALPITATIONS, OCCASIONAL 04/17/2010   Qualifier: Diagnosis of  By: Deborra Medina MD, Talia     Pancreatitis 10/14/2017   Past Surgical History:  Procedure Laterality Date   BREAST EXCISIONAL BIOPSY Left 1997   ? left side, done at time of reduction , benign   BREAST REDUCTION SURGERY     bilateral   CESAREAN SECTION     CHOLECYSTECTOMY     COLONOSCOPY     ESOPHAGOGASTRODUODENOSCOPY     ESOPHAGOGASTRODUODENOSCOPY (EGD) WITH PROPOFOL N/A 10/21/2020   Procedure:  ESOPHAGOGASTRODUODENOSCOPY (EGD) WITH PROPOFOL;  Surgeon: Lesly Rubenstein, MD;  Location: [facility] ENDOSCOPY;  Service: Endoscopy;  Laterality: N/A;   REDUCTION MAMMAPLASTY Bilateral 1997   VAGINAL HYSTERECTOMY     Patient Active Problem List   Diagnosis Date Noted   Abnormal MRI, lumbar spine (01/29/2020) 02/19/2020   Anxiety due to MRI 01/25/2020   Claustrophobia associated to MRI 01/25/2020   Enthesopathy of knee region (Right) 01/25/2020   Enthesopathy of knee region (Left) 01/25/2020   Lumbar facet hypertrophy 01/25/2020   Lumbosacral radiculopathy at S1 (Left) 01/17/2020   Lumbosacral radiculopathy (S1) (Left) 01/17/2020   Chronic lower extremity pain (Bilateral) (L>R) 01/17/2020   History of pancreatitis 07/18/2019   Obstructive sleep apnea syndrome 01/02/2019   Lumbar facet arthropathy (Multilevel) (Bilateral) 12/14/2018   Osteoarthritis involving multiple joints 11/28/2018   Chronic hip pain (Left) 11/16/2018   Acute hip pain (Left) 11/16/2018   DDD (degenerative disc disease), lumbosacral 11/16/2018   Chronic hip pain (Secondary source of pain) (Bilateral) (L>R) 05/19/2017   Elevated C-reactive protein (CRP) 05/19/2017   Elevated sed rate 05/19/2017   Spondylosis without myelopathy or radiculopathy, lumbosacral region 05/19/2017   Pharmacologic therapy 05/06/2017   Disorder of  skeletal system 05/06/2017   Problems influencing health status 05/06/2017   Chest pain with moderate risk of acute coronary syndrome 04/21/2017   DOE (dyspnea on exertion) 04/21/2017   Intermittent palpitations 04/21/2017   Vitamin D insufficiency 05/04/2016   Depression 02/04/2016   Fatty liver 02/04/2016   Hypertension associated with diabetes ([place]) 02/04/2016   Chronic pain syndrome 01/15/2016   Acute low back pain 05/28/2015   Lumbar transverse process fracture (HCC) (Left L1) (03/11/15) 05/28/2015   Long term current use of opiate analgesic 05/01/2015   Long term prescription opiate use  05/01/2015   Opiate use (25 MME/Day) 05/01/2015   Encounter for therapeutic drug level monitoring 05/01/2015   Encounter for pain management planning 05/01/2015   Chronic low back pain (Primary Source of Pain) (Bilateral) (L>R) 05/01/2015   Lumbar facet syndrome (Bilateral) (L>R) 05/01/2015   Chronic knee pain [place] source of pain) (Bilateral) (L>R) 05/01/2015   Lumbar spondylosis 05/01/2015   Neuropathic pain 05/01/2015   Neurogenic pain 05/01/2015   Myofascial pain 05/01/2015   Essential hypertriglyceridemia 04/26/2014   Type 2 diabetes mellitus with hyperglycemia, with long-term current use of insulin ([place]) 04/26/2014   Anxiety 05/15/2010   Obesity 05/15/2010   Hyperlipidemia 04/03/2010   Fatty liver disease 04/02/2010   Pure hypercholesterolemia 03/19/2010   GERD 04/17/2009   Constipation 04/17/2009   TOBACCO ABUSE 11/06/2008    ONSET DATE: January 2023  REFERRING DIAG: C71.9 (ICD-10-CM) - Malignant neoplasm of brain, unspecified  THERAPY DIAG:  Muscle weakness (generalized)  Other lack of coordination  Unsteadiness on feet  Difficulty in walking, not elsewhere classified  Rationale for Evaluation and Treatment Rehabilitation  SUBJECTIVE:                                                                                                                                                                                              SUBJECTIVE STATEMENT: Pt had a good Thanksgiving. Ambulated with assistance for a little bit and ascended/descended steps with assistance.   Pt accompanied by:  Kelly Rollins  PAIN:  Are you having pain? 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 Pt's husband, Kelly Rollins, present with pt. Kelly Rollins provides majority of hx as pt has difficulty with speech due to Broca's aphasia  (working with SLP). Pt reports partial paralysis of R side. She is unable to move her RUE, RLE also affected. Pt ambulates with hemiwalker in their home for short distances and does not use WC  at home. She fatigues quickly with ambulation. Pt has AFO for RLE but unable to determine how to donn it without it hurting her LE. Pt currently with wound on R calf covered by large bandage that they have been treating for at least a month (reports physician aware). In addition to wound on R calf, pt reports R side skin irritation under breast and lateral trunk due to bra underwire. They have since removed underwire. Pt was hospitalized 10 days in June due to UTI. She has had at least 12 falls in the last six months. Reports no injuries with falls, and states "just bumps and bruises." Pt reports she has constant head pain. Pt spouse reports pt has pain throughout her whole right side down to LE. Can have bad cramps in RUE and RLE. Reports no numbness/tingling. Occ dizziness, unable to describe further. Pt sleeps in recliner, wakes up stiff in the morning. PMH per chart includes: Anorexia, dehydration, Adult Failure to thrive, contracture of muscles multiple sites. Pt./caregiver reports recently being hospitalized with a UTI, and dehydration. Extensive hx, please refer to chart for full details.   PAIN: from eval Are you having pain? Yes: NPRS scale: 3/10 Pain location: Top of head, front Pain description: States, "cancer pain" head pain Aggravating factors: unsure Relieving factors: medications  PRECAUTIONS: Fall  WEIGHT BEARING RESTRICTIONS No  FALLS: Has patient fallen in last 6 months? Yes. Number of falls 12  LIVING ENVIRONMENT: information primarily from chart Lives with: lives with their spouse Lives in: House/apartment Stairs: Yes: External: 4 steps; bilateral but cannot reach both Has following equipment at home: Single point cane, Walker - 4 wheeled, Hemi walker, shower chair, bed side commode,  Grab bars, and lift chair  PLOF: Independent  PATIENT GOALS  "Normal function" as much as possible. Improving her balance.    TODAY'S TREATMENT:   TherEx Ambulated with hemiwalker, close CGA for safety, WC follow approx 178 ft before requiring rest break  NMR Standing balance at support surface, WC behind pt, close CGA throughout spelling out alphabet on board  -then repeated standing balance activity with drawing pictures on board. Close CGA throughout.  Lateral weight shift onto RLE with green wobble pad under RLE 10x - intermittent UE support. CGA   PATIENT EDUCATION: Education details: Pt educated throughout session about proper posture and technique with exercises. Improved exercise technique, movement at target joints, use of target muscles after min to mod verbal, visual, tactile cues.   Person educated: pt and her spouse Education method:Explanation, Demonstration, Tactile cues, and Verbal cues, Education comprehension: verbalized understanding, returned demonstration, tactile cues required, and needs further education   HOME EXERCISE PROGRAM:  No updates today, pt to continue HEP as previously given  Access Code: [email or web address] URL: [email or web address] Date: 11/18/2021 Prepared by: Ricard Dillon  Exercises - Elbow Extension with Anchored Resistance  - 1 x daily - 5 x weekly - 3 sets - 10 reps  Access Code: [email or web address] URL: [email or web address] Date: 10/02/2021 Prepared by: Sande Brothers  Exercises - Seated Hip Flexion March with Ankle Weights  - 1 x daily - 7 x weekly - 3 sets - 10 reps - Seated Long Arc Quad  - 1 x daily - 7 x weekly - 3 sets - 10 reps - Supine Quad Set  - 1 x daily - 7 x weekly - 3 sets - 10 reps - Proper Sit to Stand Technique  - 1 x daily - 7 x weekly - 3 sets - 10 reps  GOALS: Goals reviewed with patient? Yes  SHORT TERM GOALS: Target date: 01/29/2022   Patient will be independent in home exercise program  to improve strength/mobility for better functional independence with ADLs. Baseline: to be initiated next 1-2 sessions; 10/17 previously issued, pt is 11/16: to be advanced as pt has gained strength since previous assessment  Goal status: ONGOING   LONG TERM GOALS: Target date: 03/12/2022   Patient will demo ability to perform at least 1 STS by pushing from [facility] with LUE instead of pulling to stand to improve functional mobility Baseline: Goal changed as pt FOTO tracked by OT, with new goal: pt currently pulls to stand; 11/16: deferred due to pt feeling unwell; 11/22: able to complete with mod assist Goal status: ONGOING   2.   Patient (< 67 years old) will be able to perform and complete five times sit to stand test from standard chair height in <30 seconds to indicate increased LE strength and improved balance. Baseline: Pt able to complete only 1 stand, insufficient strength currently to complete full test; 10/17 31 seconds pull to stand with UE, close CGA-min a 11/16: deferred due to pt feeling unwell Goal status:  PARTIALLY MET  3.  Pt will improve LLE strength by at least 1/2 point on MMT in order to increase ease with transfers and functional mobility. Baseline: LLE grossly 4/5 throughout; 10/17: still generally 4/5 B, however now exhibits AROM with R hip flexion and knee ext; 11/16: 4+/5 LLE, RLE 3-/5, noted improvement however in quads and hip flexors  Goal status: MET  4.  Patient will increase 10 meter walk test to at least 0.80 m/s as to improve gait speed for better community ambulation and to reduce fall risk. Baseline: to be tested next 1-2 visits; 10/17: pt unable to perform 11/16: deferred due to pt feeling unwell; 11/21: 0.17 m/s Goal status: IN PROGRESS  5.  Pt will complete STS from standard height chair with UUE assist off armrests and with no greater than min assist to improve ease and safety with transfers. Baseline: mod a x2; 10/17: pulling to stand with LUE CGA 11/16:  deferred due to pt feeling unwell; 11/21: currently requires mod a to complete Goal status: IN PROGRESS,   6.  Pt will improve RLE strength by at least 1/2 point on MMT in order to increase ease with transfers and functional mobility. Baseline: 11/16: 4+/5 LLE, RLE 3-/5, noted improvement however in quads and hip flexors on R Goal status: NEW   ASSESSMENT:  CLINICAL IMPRESSION: Continued walking endurance activities with close CGA and WC follow. Pt also performed more standing balance activities today with improved endurance compared to previous session. The pt would benefit from continued skilled PT to address immobility, LE muscle weakness, and pain in order to decrease fall risk and increase functional mobility and QOL.   OBJECTIVE IMPAIRMENTS Abnormal gait, decreased activity tolerance, decreased balance, decreased coordination, decreased endurance, decreased mobility, difficulty walking, decreased ROM, decreased strength, hypomobility, increased edema, increased fascial restrictions, increased muscle spasms, impaired flexibility, impaired sensation, impaired tone, impaired UE functional use, improper body mechanics, postural dysfunction, and pain.   ACTIVITY LIMITATIONS carrying, lifting, bending, sitting, standing, squatting, stairs, transfers, bed mobility, bathing, toileting, dressing, reach over head, hygiene/grooming, locomotion level, and caring for others  PARTICIPATION LIMITATIONS: meal prep, cleaning, laundry, medication management, personal finances, driving, shopping, community activity, occupation, and yard work  PERSONAL FACTORS Fitness, Past/current experiences, Sex, and 3+ comorbidities: Anorexia, dehydration, Adult Failure to thrive, contracture  of muscles multiple sites, multiple falls, UTI, extensive PMH refer to chart  are also affecting patient's functional outcome.   REHAB POTENTIAL: Fair    CLINICAL DECISION MAKING: Evolving/moderate complexity  EVALUATION  COMPLEXITY: High  PLAN: PT FREQUENCY: 2x/week  PT DURATION: 12 weeks  PLANNED INTERVENTIONS: Therapeutic exercises, Therapeutic activity, Neuromuscular re-education, Balance training, Gait training, Patient/Family education, Self Care, Joint mobilization, Joint manipulation, Stair training, Vestibular training, Canalith repositioning, Orthotic/Fit training, DME instructions, Wheelchair mobility training, Spinal mobilization, Cryotherapy, Moist heat, scar mobilization, Splintting, Taping, Manual therapy, and Re-evaluation  PLAN FOR NEXT SESSION:  stretching, strengthening, gait, balance, manual, standing balance 2:45 PM, 12/30/21   Zollie Pee, PT 12/30/2021, 2:45 PM

## 2022-01-01 ENCOUNTER — Ambulatory Visit: Payer: Medicare Other

## 2022-01-01 ENCOUNTER — Encounter: Payer: Medicare Other | Admitting: Speech Pathology

## 2022-01-01 ENCOUNTER — Ambulatory Visit: Payer: Medicare Other | Admitting: Occupational Therapy

## 2022-01-01 NOTE — Therapy (Signed)
OUTPATIENT OCCUPATIONAL THERAPY TREATMENT NOTE  Patient Name: Kelly Rollins MRN: [id number] DOB:14-Apr-1970, 51 y.o., female Today's Date: 01/01/2022  PCP: [name], PA-C REFERRING PROVIDER: [name], Corrine Jeani Hawking, PA-C   OT End of Session - 01/01/22 1837     Visit Number 25    Number of Visits 48    Date for OT Re-Evaluation 01/22/22    Authorization Time Period Progress reporting period starting 09/08/2021    OT Start Time 1300    OT Stop Time 1345    OT Time Calculation (min) 45 min    Activity Tolerance Patient tolerated treatment well    Behavior During Therapy [facility] for tasks assessed/performed             Past Medical History:  Diagnosis Date   ABDOMINAL PAIN OTHER SPECIFIED SITE 02/21/2009   Qualifier: Diagnosis of  By: Deborra Medina MD, Talia     ABDOMINAL PAIN-LUQ 04/17/2009   Qualifier: Diagnosis of  By: Fuller Plan MD Lamont Snowball T    Anal fissure    Anginal pain ([place])    Asthma    Depression    Diabetes mellitus without complication ([place])    Dyspnea    Dysrhythmia    Headache(784.0)    HEMATOCHEZIA 04/17/2009   Qualifier: Diagnosis of  By: Fuller Plan MD Lamont Snowball T    Hyperlipidemia    Hypertension    IBS (irritable bowel syndrome)    Migraine headache 11/06/2008   Qualifier: Diagnosis of  By: Deborra Medina MD, Talia     OVARIAN CYST, RIGHT 02/21/2009   Qualifier: Diagnosis of  By: Deborra Medina MD, Talia     PALPITATIONS, OCCASIONAL 04/17/2010   Qualifier: Diagnosis of  By: Deborra Medina MD, Talia     Pancreatitis 10/14/2017   Past Surgical History:  Procedure Laterality Date   BREAST EXCISIONAL BIOPSY Left 1997   ? left side, done at time of reduction , benign   BREAST REDUCTION SURGERY     bilateral   CESAREAN SECTION     CHOLECYSTECTOMY     COLONOSCOPY     ESOPHAGOGASTRODUODENOSCOPY     ESOPHAGOGASTRODUODENOSCOPY (EGD) WITH PROPOFOL N/A 10/21/2020   Procedure: ESOPHAGOGASTRODUODENOSCOPY (EGD) WITH PROPOFOL;  Surgeon: Lesly Rubenstein, MD;  Location: [facility]  ENDOSCOPY;  Service: Endoscopy;  Laterality: N/A;   REDUCTION MAMMAPLASTY Bilateral 1997   VAGINAL HYSTERECTOMY     Patient Active Problem List   Diagnosis Date Noted   Abnormal MRI, lumbar spine (01/29/2020) 02/19/2020   Anxiety due to MRI 01/25/2020   Claustrophobia associated to MRI 01/25/2020   Enthesopathy of knee region (Right) 01/25/2020   Enthesopathy of knee region (Left) 01/25/2020   Lumbar facet hypertrophy 01/25/2020   Lumbosacral radiculopathy at S1 (Left) 01/17/2020   Lumbosacral radiculopathy (S1) (Left) 01/17/2020   Chronic lower extremity pain (Bilateral) (L>R) 01/17/2020   History of pancreatitis 07/18/2019   Obstructive sleep apnea syndrome 01/02/2019   Lumbar facet arthropathy (Multilevel) (Bilateral) 12/14/2018   Osteoarthritis involving multiple joints 11/28/2018   Chronic hip pain (Left) 11/16/2018   Acute hip pain (Left) 11/16/2018   DDD (degenerative disc disease), lumbosacral 11/16/2018   Chronic hip pain (Secondary source of pain) (Bilateral) (L>R) 05/19/2017   Elevated C-reactive protein (CRP) 05/19/2017   Elevated sed rate 05/19/2017   Spondylosis without myelopathy or radiculopathy, lumbosacral region 05/19/2017   Pharmacologic therapy 05/06/2017   Disorder of skeletal system 05/06/2017   Problems influencing health status 05/06/2017   Chest pain with moderate risk of acute coronary syndrome 04/21/2017  DOE (dyspnea on exertion) 04/21/2017   Intermittent palpitations 04/21/2017   Vitamin D insufficiency 05/04/2016   Depression 02/04/2016   Fatty liver 02/04/2016   Hypertension associated with diabetes ([place]) 02/04/2016   Chronic pain syndrome 01/15/2016   Acute low back pain 05/28/2015   Lumbar transverse process fracture (HCC) (Left L1) (03/11/15) 05/28/2015   Long term current use of opiate analgesic 05/01/2015   Long term prescription opiate use 05/01/2015   Opiate use (25 MME/Day) 05/01/2015   Encounter for therapeutic drug level monitoring  05/01/2015   Encounter for pain management planning 05/01/2015   Chronic low back pain (Primary Source of Pain) (Bilateral) (L>R) 05/01/2015   Lumbar facet syndrome (Bilateral) (L>R) 05/01/2015   Chronic knee pain [facility] source of pain) (Bilateral) (L>R) 05/01/2015   Lumbar spondylosis 05/01/2015   Neuropathic pain 05/01/2015   Neurogenic pain 05/01/2015   Myofascial pain 05/01/2015   Essential hypertriglyceridemia 04/26/2014   Type 2 diabetes mellitus with hyperglycemia, with long-term current use of insulin ([place]) 04/26/2014   Anxiety 05/15/2010   Obesity 05/15/2010   Hyperlipidemia 04/03/2010   Fatty liver disease 04/02/2010   Pure hypercholesterolemia 03/19/2010   GERD 04/17/2009   Constipation 04/17/2009   TOBACCO ABUSE 11/06/2008    ONSET DATE: 02/06/2021  REFERRING DIAG: Astrocytoma  THERAPY DIAG:  Muscle weakness (generalized)  Other lack of coordination  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HCC)  Rationale for Evaluation and Treatment Rehabilitation  SUBJECTIVE:     SUBJECTIVE STATEMENT: Pt reports that she had a good holiday with her family. Pt accompanied by: significant other  PERTINENT HISTORY:  Pt. Is a 51 y.o. 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 PMHx includes: Anorexia, dehydration, Adult Failure to thrive, contracture of muscles multiple sites. Pt./caregiver reports recently being hospitalized with a UTI, and dehydration.   PRECAUTIONS: Fall  WEIGHT BEARING RESTRICTIONS No  PAIN:  Are you having pain?  Pt. reports 3-4/10 pain in the entire R side.   FALLS: Has patient fallen in last 6 months? Yes. Number of falls Multiple Falls  LIVING ENVIRONMENT: Lives with: Family Lives in: House/apartment Stairs: Yes: External: 4 steps; bilateral but cannot reach both Has following equipment at home: Single point cane, Walker - 4 wheeled, Hemi walker, shower chair, bed side  commode, and Grab bars Lift chair in the tub, [place],  PLOF: Independent  PATIENT GOALS: To improve the right UE  OBJECTIVE:   HAND DOMINANCE: Right  ADLs: Transfers/ambulation related to ADLs: Eating: MaxA with cutting food, setting up, Modified I with nondominant Left hand, [place]  Grooming: Independent  UB Dressing: MaxA  LB Dressing: MaxA Toileting: Uses a BSCommode mInA transfers, MaxA clothing negotiation Bathing: MaxA Tub Shower transfers:  Uses a shower lift chair Equipment:  w/c, BSCommode, cane   IADLs: Shopping: Husband performs Light housekeeping: Max Meal Prep: MaxA Community mobility: Relies on family Medication management:Husband assists Doctor, hospital: Performs together Handwriting: Not legible  MOBILITY STATUS: Hx of falls   ACTIVITY TOLERANCE: Activity tolerance: limited  FUNCTIONAL OUTCOME MEASURES: FOTO: 11.76  UPPER EXTREMITY ROM  : WFL Left AROM/PROM   Passive ROM Right eval Right  08/18/21 Right 09/08/2021 Right 10/30/2021  Shoulder flexion 30   48 15  Shoulder abduction 40 62 53 45  Shoulder adduction         Shoulder extension         Shoulder internal rotation  Shoulder external rotation         Elbow flexion 120   110 122  Elbow extension -67   -46 -72  Wrist flexion Neutral   Neutral neutral  Wrist extension 12   40 25  Wrist ulnar deviation         Wrist radial deviation         Wrist pronation         Wrist supination         (Blank rows = not tested)     UPPER EXTREMITY MMT:      MMT Right eval Left eval  Shoulder flexion 0/5 4+/5  Shoulder abduction 0/5 4+/5  Shoulder adduction      Shoulder extension      Shoulder internal rotation      Shoulder external rotation      Middle trapezius      Lower trapezius      Elbow flexion 0/5 4+/5  Elbow extension 0/5 4+/5  Wrist flexion      Wrist extension 0/5 4+/5  Wrist ulnar deviation      Wrist radial deviation      Wrist pronation      Wrist  supination      (Blank rows = not tested)   FOTO score: 09/08/2021: 11   SENSATION: Light touch: WFL  EDEMA:   Wrist: 18.5 cm MCP: 21.5 cm  10/30/2021:  Wrist: 17.5 cm MCPs: 20cm    MUSCLE TONE: RUE: Severe  COGNITION: Overall cognitive status: Impaired  VISION: Subjective report: Wears glasses Baseline vision: Wears glasses all the time Pt. Reports no changes    PRAXIS: Impaired: Motor planning  TODAY'S TREATMENT:  Moist heat applied to R shoulder and R hand x 5 min for pain reduction/muscle relaxation in prep for passive stretching to the RUE.    Therapeutic Exercise: Placed rolled towel to lumbar spine to promote more erect sitting posture while performing passive shoulder retraction stretch with 20 sec hold x2.  Performed R scapular gliding for abd/add and elevation depression.  Attempted slow rocking for passive shoulder flex/ext and abd/add with limited tolerance.  Placed rolled towel beneath R axilla to promote gentle shoulder abd stretch.  Pt tolerated this for ~15 min.  Performed passive R shoulder ER/IR with elbow at side, elbow flex/ext, forearm pron/sup, wrist flex/ext, and digit flex/ext/abd.  With slow, prolonged stretch, OT able to passively extend digits for pt to rest hand in about 75% of full digit ext on top of hot pack for <2 min.  PATIENT EDUCATION: Education details: Right UE ROM, positioning of the RUE Person educated: Patient and Spouse Education method: Explanation, Demonstration, Tactile cues, and Verbal cues Education comprehension: verbalized understanding, returned demonstration, and needs further education   HOME EXERCISE PROGRAM:   Pt./caregiver education about positioning of the RUE, ROM    GOALS: Goals reviewed with patient? Yes  SHORT TERM GOALS: Target date: 12/11/2021 Pt./caregivers will demonstrate independence with HEPs for UE ROM, and edema control Baseline: 10/30/2021: Pt. Edema has improved. Pt. Requires caregiver assist for  positioning. 09/08/2021: Pt. Requires assist for positioning, ROM, edema control. Eval: No current HEPs  Goal status: Ongoing  LONG TERM GOALS: Target date: 01/22/2022 Pt. Will improve right hand edema by 1 cm at the wrist, and MCPs Baseline: 10/30/2021:  wrist: 17.5 cm, MCPs: 20 cm 09/08/2021: Wrist: 17.5 cm, MCPs: 20.5 cm Eval: Wrist: 18.5cm, MCPs: 21.5cm Goal status: Ongoing  2.  Pt. Will improve right shoulder PROM by  10 degrees with ADLs Baseline: 10/30/2021: shoulder flexion: 15, abduction: 45 09/08/2021: Right shoulder flexion: 48, abduction: 53 Eval: PROM shoulder flexion: 30 degrees, Abduction: 40 degrees Goal status: Ongoing  3.  Pt. Will improve right elbow PROM by 10 degrees to prepare for hand to mouth patterns Baseline: 10/30/2021:elbow extension -72, flexion: 122 09/08/2021: elbow extension: -46, flexion: 110 Eval:   elbow extension -67, flexion 120 Goal status: Ongoing  4.  Pt. Will improve PROM right wrist extension by 10 degrees Baseline: 10/30/2021: wrist extension 25 09/08/2021: Wrist extension: 40 Eval: wrist extension 12 degrees, flexion to neutral Goal status: Ongoing  5.  Pt. Will tolerate full digit extension in the right hand to promote good skin integrity through the palmar surface of the right hand  Baseline: 10/30/2021: Pt. Tolerates ROM through 50% of  digit extension range, occasionally 75% 09/08/2021: Pt. Is able to tolerate PROM through 50% of digit extension in the digits. Eval: Pt. Maintains digits in tight flexion Goal status: Ongoing  6.  Pt. Will require minA UE dressing using one armed dressing techniques. Baseline: 10/30/2021: Independent with button down shirt/sweater/cardigan, MaxA pullover shirt.09/08/2021: MaxA Eval: MaxA Goal status: Ongoing   ASSESSMENT:  CLINICAL IMPRESSION: Pt reported doing well today.  Pt at start of therapy was 3/10 throughout the RUE, ended in 4/10 pain after R arm was mobilized to increase ROM for ease of self care tasks . Pt  responds well to moist heat for pain management and gentle, slow, prolonged stretch throughout the RUE.  Encouraged pt use 1-2 rolled washcloths in hand for achieving slightly more digit extension than pt's palm protector provides, when able to tolerate this.  Pt continues to present with severe spasticity throughout the RUE.  With slow, prolonged stretch, OT able to passively extend digits for pt to rest hand in about 75% of full digit ext on top of hot pack for <2 min.  Used rolled washcloth intermittently throughout session to provide prolonged and tolerable stretch for extending digits away from palm.  Pt continues to benefit from OT services to work towards improving pain, ROM, and to increase engagement of the RUE during ADLs, and IADLs.   PERFORMANCE DEFICITS in functional skills including ADLs, IADLs, coordination, proprioception, sensation, edema, tone, ROM, strength, pain, FMC, GMC, balance, decreased knowledge of use of DME, skin integrity, and UE functional use, cognitive skills including attention, memory, and safety awareness, and psychosocial skills including coping strategies.   IMPAIRMENTS are limiting patient from ADLs, IADLs, education, work, leisure, and social participation.   COMORBIDITIES may have co-morbidities  that affects occupational performance. Patient will benefit from skilled OT to address above impairments and improve overall function.  MODIFICATION OR ASSISTANCE TO COMPLETE EVALUATION: Maximum modification of tasks or assist with assess necessary to complete an evaluation.  OT OCCUPATIONAL PROFILE AND HISTORY: Detailed assessment: Review of records and additional review of physical, cognitive, psychosocial history related to current functional performance.  CLINICAL DECISION MAKING: High - multiple treatment options, significant modification of task necessary  REHAB POTENTIAL: Good for stated goals  EVALUATION COMPLEXITY: High    PLAN: OT FREQUENCY:  2x/week  OT DURATION: 12 weeks  PLANNED INTERVENTIONS: self care/ADL training, therapeutic exercise, therapeutic activity, neuromuscular re-education, manual therapy, passive range of motion, paraffin, moist heat, contrast bath, patient/family education, cognitive remediation/compensation, energy conservation, and coping strategies training  RECOMMENDED OTHER SERVICES: PT, and ST  CONSULTED AND AGREED WITH PLAN OF CARE: Patient and family member/caregiver  PLAN FOR NEXT SESSION: Initiate PROM, and  RUE Edema control techniques   Leta Speller, MS, OTR/L  Darleene Cleaver, OT 01/01/2022, 6:39 PM

## 2022-01-06 ENCOUNTER — Ambulatory Visit: Payer: Medicare Other

## 2022-01-06 ENCOUNTER — Encounter: Payer: Medicare Other | Admitting: Speech Pathology

## 2022-01-08 ENCOUNTER — Ambulatory Visit: Payer: Medicare Other | Admitting: Occupational Therapy

## 2022-01-08 ENCOUNTER — Encounter: Payer: Medicare Other | Admitting: Speech Pathology

## 2022-01-13 ENCOUNTER — Ambulatory Visit: Payer: Medicare Other | Attending: Nurse Practitioner

## 2022-01-13 ENCOUNTER — Encounter: Payer: Medicare Other | Admitting: Speech Pathology

## 2022-01-13 DIAGNOSIS — M6281 Muscle weakness (generalized): Secondary | ICD-10-CM | POA: Insufficient documentation

## 2022-01-13 DIAGNOSIS — C719 Malignant neoplasm of brain, unspecified: Secondary | ICD-10-CM | POA: Insufficient documentation

## 2022-01-13 DIAGNOSIS — R278 Other lack of coordination: Secondary | ICD-10-CM | POA: Diagnosis present

## 2022-01-13 NOTE — Therapy (Unsigned)
OUTPATIENT OCCUPATIONAL THERAPY TREATMENT NOTE  Patient Name: Kelly Rollins MRN: [id number] DOB:1970/09/28, 51 y.o., female Today's Date: 01/13/2022  PCP: [name], PA-C REFERRING PROVIDER: [name], Corrine Jeani Hawking, PA-C   OT End of Session - 01/13/22 1545     Visit Number 26    Number of Visits 57    Date for OT Re-Evaluation 01/22/22    Authorization Time Period Progress reporting period starting 09/08/2021    OT Start Time 1445    OT Stop Time 1530    OT Time Calculation (min) 45 min    Activity Tolerance Patient tolerated treatment well    Behavior During Therapy [facility] for tasks assessed/performed             Past Medical History:  Diagnosis Date   ABDOMINAL PAIN OTHER SPECIFIED SITE 02/21/2009   Qualifier: Diagnosis of  By: Deborra Medina MD, Talia     ABDOMINAL PAIN-LUQ 04/17/2009   Qualifier: Diagnosis of  By: Fuller Plan MD Lamont Snowball T    Anal fissure    Anginal pain ([place])    Asthma    Depression    Diabetes mellitus without complication ([place])    Dyspnea    Dysrhythmia    Headache(784.0)    HEMATOCHEZIA 04/17/2009   Qualifier: Diagnosis of  By: Fuller Plan MD Lamont Snowball T    Hyperlipidemia    Hypertension    IBS (irritable bowel syndrome)    Migraine headache 11/06/2008   Qualifier: Diagnosis of  By: Deborra Medina MD, Talia     OVARIAN CYST, RIGHT 02/21/2009   Qualifier: Diagnosis of  By: Deborra Medina MD, Talia     PALPITATIONS, OCCASIONAL 04/17/2010   Qualifier: Diagnosis of  By: Deborra Medina MD, Talia     Pancreatitis 10/14/2017   Past Surgical History:  Procedure Laterality Date   BREAST EXCISIONAL BIOPSY Left 1997   ? left side, done at time of reduction , benign   BREAST REDUCTION SURGERY     bilateral   CESAREAN SECTION     CHOLECYSTECTOMY     COLONOSCOPY     ESOPHAGOGASTRODUODENOSCOPY     ESOPHAGOGASTRODUODENOSCOPY (EGD) WITH PROPOFOL N/A 10/21/2020   Procedure: ESOPHAGOGASTRODUODENOSCOPY (EGD) WITH PROPOFOL;  Surgeon: Lesly Rubenstein, MD;  Location: [facility]  ENDOSCOPY;  Service: Endoscopy;  Laterality: N/A;   REDUCTION MAMMAPLASTY Bilateral 1997   VAGINAL HYSTERECTOMY     Patient Active Problem List   Diagnosis Date Noted   Abnormal MRI, lumbar spine (01/29/2020) 02/19/2020   Anxiety due to MRI 01/25/2020   Claustrophobia associated to MRI 01/25/2020   Enthesopathy of knee region (Right) 01/25/2020   Enthesopathy of knee region (Left) 01/25/2020   Lumbar facet hypertrophy 01/25/2020   Lumbosacral radiculopathy at S1 (Left) 01/17/2020   Lumbosacral radiculopathy (S1) (Left) 01/17/2020   Chronic lower extremity pain (Bilateral) (L>R) 01/17/2020   History of pancreatitis 07/18/2019   Obstructive sleep apnea syndrome 01/02/2019   Lumbar facet arthropathy (Multilevel) (Bilateral) 12/14/2018   Osteoarthritis involving multiple joints 11/28/2018   Chronic hip pain (Left) 11/16/2018   Acute hip pain (Left) 11/16/2018   DDD (degenerative disc disease), lumbosacral 11/16/2018   Chronic hip pain (Secondary source of pain) (Bilateral) (L>R) 05/19/2017   Elevated C-reactive protein (CRP) 05/19/2017   Elevated sed rate 05/19/2017   Spondylosis without myelopathy or radiculopathy, lumbosacral region 05/19/2017   Pharmacologic therapy 05/06/2017   Disorder of skeletal system 05/06/2017   Problems influencing health status 05/06/2017   Chest pain with moderate risk of acute coronary syndrome 04/21/2017  DOE (dyspnea on exertion) 04/21/2017   Intermittent palpitations 04/21/2017   Vitamin D insufficiency 05/04/2016   Depression 02/04/2016   Fatty liver 02/04/2016   Hypertension associated with diabetes ([place]) 02/04/2016   Chronic pain syndrome 01/15/2016   Acute low back pain 05/28/2015   Lumbar transverse process fracture (HCC) (Left L1) (03/11/15) 05/28/2015   Long term current use of opiate analgesic 05/01/2015   Long term prescription opiate use 05/01/2015   Opiate use (25 MME/Day) 05/01/2015   Encounter for therapeutic drug level monitoring  05/01/2015   Encounter for pain management planning 05/01/2015   Chronic low back pain (Primary Source of Pain) (Bilateral) (L>R) 05/01/2015   Lumbar facet syndrome (Bilateral) (L>R) 05/01/2015   Chronic knee pain [facility] source of pain) (Bilateral) (L>R) 05/01/2015   Lumbar spondylosis 05/01/2015   Neuropathic pain 05/01/2015   Neurogenic pain 05/01/2015   Myofascial pain 05/01/2015   Essential hypertriglyceridemia 04/26/2014   Type 2 diabetes mellitus with hyperglycemia, with long-term current use of insulin ([place]) 04/26/2014   Anxiety 05/15/2010   Obesity 05/15/2010   Hyperlipidemia 04/03/2010   Fatty liver disease 04/02/2010   Pure hypercholesterolemia 03/19/2010   GERD 04/17/2009   Constipation 04/17/2009   TOBACCO ABUSE 11/06/2008    ONSET DATE: 02/06/2021  REFERRING DIAG: Astrocytoma  THERAPY DIAG:  Muscle weakness (generalized)  Other lack of coordination  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HCC)  Rationale for Evaluation and Treatment Rehabilitation  SUBJECTIVE:     SUBJECTIVE STATEMENT: Pt reports not being able to lay down in the bed for at least 9 months.  Pt spends most of her time in her recliner at home and is up in the wc for appointments.   Pt accompanied by: significant other  PERTINENT HISTORY:  Pt. Is a 51 y.o. 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 PMHx includes: Anorexia, dehydration, Adult Failure to thrive, contracture of muscles multiple sites. Pt./caregiver reports recently being hospitalized with a UTI, and dehydration.   PRECAUTIONS: Fall  WEIGHT BEARING RESTRICTIONS No  PAIN:  Are you having pain?  Pt. reports 3/10 pain in the entire R side.   FALLS: Has patient fallen in last 6 months? Yes. Number of falls Multiple Falls  LIVING ENVIRONMENT: Lives with: Family Lives in: House/apartment Stairs: Yes: External: 4 steps; bilateral but cannot reach both Has  following equipment at home: Single point cane, Walker - 4 wheeled, Hemi walker, shower chair, bed side commode, and Grab bars Lift chair in the tub, [place],  PLOF: Independent  PATIENT GOALS: To improve the right UE  OBJECTIVE:   HAND DOMINANCE: Right  ADLs: Transfers/ambulation related to ADLs: Eating: MaxA with cutting food, setting up, Modified I with nondominant Left hand, [place]  Grooming: Independent  UB Dressing: MaxA  LB Dressing: MaxA Toileting: Uses a BSCommode mInA transfers, MaxA clothing negotiation Bathing: MaxA Tub Shower transfers:  Uses a shower lift chair Equipment:  w/c, BSCommode, cane   IADLs: Shopping: Husband performs Light housekeeping: Max Meal Prep: MaxA Community mobility: Relies on family Medication management:Husband assists Doctor, hospital: Performs together Handwriting: Not legible  MOBILITY STATUS: Hx of falls   ACTIVITY TOLERANCE: Activity tolerance: limited  FUNCTIONAL OUTCOME MEASURES: FOTO: 11.76  UPPER EXTREMITY ROM  : WFL Left AROM/PROM   Passive ROM Right eval Right  08/18/21 Right 09/08/2021 Right 10/30/2021  Shoulder flexion 30   48 15  Shoulder abduction 40 62 53 45  Shoulder adduction  Shoulder extension         Shoulder internal rotation         Shoulder external rotation         Elbow flexion 120   110 122  Elbow extension -67   -46 -72  Wrist flexion Neutral   Neutral neutral  Wrist extension 12   40 25  Wrist ulnar deviation         Wrist radial deviation         Wrist pronation         Wrist supination         (Blank rows = not tested)     UPPER EXTREMITY MMT:      MMT Right eval Left eval  Shoulder flexion 0/5 4+/5  Shoulder abduction 0/5 4+/5  Shoulder adduction      Shoulder extension      Shoulder internal rotation      Shoulder external rotation      Middle trapezius      Lower trapezius      Elbow flexion 0/5 4+/5  Elbow extension 0/5 4+/5  Wrist flexion      Wrist  extension 0/5 4+/5  Wrist ulnar deviation      Wrist radial deviation      Wrist pronation      Wrist supination      (Blank rows = not tested)   FOTO score: 09/08/2021: 11   SENSATION: Light touch: WFL  EDEMA:   Wrist: 18.5 cm MCP: 21.5 cm  10/30/2021:  Wrist: 17.5 cm MCPs: 20cm  MUSCLE TONE: RUE: Severe  COGNITION: Overall cognitive status: Impaired  VISION: Subjective report: Wears glasses Baseline vision: Wears glasses all the time Pt. Reports no changes   PRAXIS: Impaired: Motor planning  TODAY'S TREATMENT:  Moist heat not available today d/t equipment malfunction.  Self Care:   Participated in SPT wc<>bed for education on supportive positioning in supine and L side lying.  Pt was able to tolerate ~10 min in each position before neck became painful.  Educated pt and spouse on using pillows or rolled towels to cushion bony prominences, specifically heels (floating from bed), ankles and knees should be cushioned to avoid touching eachother, and use pillows to support the R shoulder and entire upper arm.  OT demonstrated these positions with pt, and pt reported both positions to be equally comfortable.  Educated on use of wedge pillow if unable to tolerate full supine, propping pillow behind back when in side lying, and pillow beneath knees as needed to support low back in supine.  Encouraged elevation of forearm and hand as able.    Therapeutic Exercise: Placed rolled towel to lumbar spine to promote more erect sitting posture while performing passive shoulder retraction stretch with 20 sec hold x2.  Performed R scapular gliding for abd/add and elevation depression.  Attempted slow rocking for passive shoulder flex/ext and abd/add with limited tolerance.  Placed rolled towel beneath R axilla to promote gentle shoulder abd stretch.  Pt tolerated this for ~15 min.  Performed passive R shoulder ER/IR with elbow at side, elbow flex/ext, forearm pron/sup, wrist flex/ext, and digit  flex/ext/abd.  With slow, prolonged stretch, OT able to passively extend digits for pt to rest hand in about 75% of full digit ext on top of hot pack for <2 min.  PATIENT EDUCATION: Education details: Right UE ROM, positioning of the RUE Person educated: Patient and Spouse Education method: Explanation, Demonstration, Tactile cues, and Verbal cues  Education comprehension: verbalized understanding, returned demonstration, and needs further education   HOME EXERCISE PROGRAM:   Pt./caregiver education about positioning of the RUE, ROM    GOALS: Goals reviewed with patient? Yes  SHORT TERM GOALS: Target date: 12/11/2021 Pt./caregivers will demonstrate independence with HEPs for UE ROM, and edema control Baseline: 10/30/2021: Pt. Edema has improved. Pt. Requires caregiver assist for positioning. 09/08/2021: Pt. Requires assist for positioning, ROM, edema control. Eval: No current HEPs  Goal status: Ongoing  LONG TERM GOALS: Target date: 01/22/2022 Pt. Will improve right hand edema by 1 cm at the wrist, and MCPs Baseline: 10/30/2021:  wrist: 17.5 cm, MCPs: 20 cm 09/08/2021: Wrist: 17.5 cm, MCPs: 20.5 cm Eval: Wrist: 18.5cm, MCPs: 21.5cm Goal status: Ongoing  2.  Pt. Will improve right shoulder PROM by 10 degrees with ADLs Baseline: 10/30/2021: shoulder flexion: 15, abduction: 45 09/08/2021: Right shoulder flexion: 48, abduction: 53 Eval: PROM shoulder flexion: 30 degrees, Abduction: 40 degrees Goal status: Ongoing  3.  Pt. Will improve right elbow PROM by 10 degrees to prepare for hand to mouth patterns Baseline: 10/30/2021:elbow extension -72, flexion: 122 09/08/2021: elbow extension: -46, flexion: 110 Eval:   elbow extension -67, flexion 120 Goal status: Ongoing  4.  Pt. Will improve PROM right wrist extension by 10 degrees Baseline: 10/30/2021: wrist extension 25 09/08/2021: Wrist extension: 40 Eval: wrist extension 12 degrees, flexion to neutral Goal status: Ongoing  5.  Pt. Will tolerate  full digit extension in the right hand to promote good skin integrity through the palmar surface of the right hand  Baseline: 10/30/2021: Pt. Tolerates ROM through 50% of  digit extension range, occasionally 75% 09/08/2021: Pt. Is able to tolerate PROM through 50% of digit extension in the digits. Eval: Pt. Maintains digits in tight flexion Goal status: Ongoing  6.  Pt. Will require minA UE dressing using one armed dressing techniques. Baseline: 10/30/2021: Independent with button down shirt/sweater/cardigan, MaxA pullover shirt.09/08/2021: MaxA Eval: MaxA Goal status: Ongoing   ASSESSMENT:  CLINICAL IMPRESSION: Pt reported doing well today.  Pt at start of therapy was 3/10 throughout the RUE, ended in 4/10 pain after R arm was mobilized to increase ROM for ease of self care tasks . Pt responds well to moist heat for pain management and gentle, slow, prolonged stretch throughout the RUE.  Encouraged pt use 1-2 rolled washcloths in hand for achieving slightly more digit extension than pt's palm protector provides, when able to tolerate this.  Pt continues to present with severe spasticity throughout the RUE.  With slow, prolonged stretch, OT able to passively extend digits for pt to rest hand in about 75% of full digit ext on top of hot pack for <2 min.  Used rolled washcloth intermittently throughout session to provide prolonged and tolerable stretch for extending digits away from palm.  Pt continues to benefit from OT services to work towards improving pain, ROM, and to increase engagement of the RUE during ADLs, and IADLs.   PERFORMANCE DEFICITS in functional skills including ADLs, IADLs, coordination, proprioception, sensation, edema, tone, ROM, strength, pain, FMC, GMC, balance, decreased knowledge of use of DME, skin integrity, and UE functional use, cognitive skills including attention, memory, and safety awareness, and psychosocial skills including coping strategies.   IMPAIRMENTS are limiting  patient from ADLs, IADLs, education, work, leisure, and social participation.   COMORBIDITIES may have co-morbidities  that affects occupational performance. Patient will benefit from skilled OT to address above impairments and improve overall function.  MODIFICATION OR ASSISTANCE  TO COMPLETE EVALUATION: Maximum modification of tasks or assist with assess necessary to complete an evaluation.  OT OCCUPATIONAL PROFILE AND HISTORY: Detailed assessment: Review of records and additional review of physical, cognitive, psychosocial history related to current functional performance.  CLINICAL DECISION MAKING: High - multiple treatment options, significant modification of task necessary  REHAB POTENTIAL: Good for stated goals  EVALUATION COMPLEXITY: High    PLAN: OT FREQUENCY: 2x/week  OT DURATION: 12 weeks  PLANNED INTERVENTIONS: self care/ADL training, therapeutic exercise, therapeutic activity, neuromuscular re-education, manual therapy, passive range of motion, paraffin, moist heat, contrast bath, patient/family education, cognitive remediation/compensation, energy conservation, and coping strategies training  RECOMMENDED OTHER SERVICES: PT, and ST  CONSULTED AND AGREED WITH PLAN OF CARE: Patient and family member/caregiver  PLAN FOR NEXT SESSION: Initiate PROM, and RUE Edema control techniques   Leta Speller, MS, OTR/L  Darleene Cleaver, OT 01/13/2022, 3:46 PM

## 2022-01-15 ENCOUNTER — Ambulatory Visit: Payer: Medicare Other

## 2022-01-15 ENCOUNTER — Encounter: Payer: Medicare Other | Admitting: Speech Pathology

## 2022-01-20 ENCOUNTER — Ambulatory Visit: Payer: Medicare Other

## 2022-01-20 ENCOUNTER — Encounter: Payer: Medicare Other | Admitting: Speech Pathology

## 2022-02-03 ENCOUNTER — Ambulatory Visit: Payer: Medicare Other

## 2022-02-03 ENCOUNTER — Encounter: Payer: Medicare Other | Admitting: Speech Pathology

## 2022-02-03 ENCOUNTER — Ambulatory Visit: Payer: Medicare Other | Attending: Adult Health Nurse Practitioner

## 2022-02-03 DIAGNOSIS — R2681 Unsteadiness on feet: Secondary | ICD-10-CM

## 2022-02-03 DIAGNOSIS — C719 Malignant neoplasm of brain, unspecified: Secondary | ICD-10-CM | POA: Insufficient documentation

## 2022-02-03 DIAGNOSIS — M6281 Muscle weakness (generalized): Secondary | ICD-10-CM

## 2022-02-03 DIAGNOSIS — R278 Other lack of coordination: Secondary | ICD-10-CM | POA: Insufficient documentation

## 2022-02-03 DIAGNOSIS — R262 Difficulty in walking, not elsewhere classified: Secondary | ICD-10-CM | POA: Diagnosis present

## 2022-02-03 NOTE — Therapy (Unsigned)
OUTPATIENT OCCUPATIONAL RECERTIFICATION NOTE  Patient Name: Kelly Rollins MRN: [id number] DOB:Nov 21, 1970, 52 y.o., female Today's Date: 02/05/2022  PCP: [name], PA-C REFERRING PROVIDER: [name], Corrine Jeani Hawking, PA-C   OT End of Session - 01/13/22 1545       Visit Number 27    Number of Visits 63    Date for OT Re-Evaluation 01/22/22     Authorization Time Period Progress reporting period starting 11/18/21     OT Start Time 1522    OT Stop Time 151600    OT Time Calculation (min) 38 min     Activity Tolerance Patient tolerated treatment well     Behavior During Therapy [facility] for tasks assessed/performed                   Past Medical History:  Diagnosis Date   ABDOMINAL PAIN OTHER SPECIFIED SITE 02/21/2009   Qualifier: Diagnosis of  By: Deborra Medina MD, Talia     ABDOMINAL PAIN-LUQ 04/17/2009   Qualifier: Diagnosis of  By: Fuller Plan MD Lamont Snowball T    Anal fissure    Anginal pain ([place])    Asthma    Depression    Diabetes mellitus without complication ([place])    Dyspnea    Dysrhythmia    Headache(784.0)    HEMATOCHEZIA 04/17/2009   Qualifier: Diagnosis of  By: Fuller Plan MD Lamont Snowball T    Hyperlipidemia    Hypertension    IBS (irritable bowel syndrome)    Migraine headache 11/06/2008   Qualifier: Diagnosis of  By: Deborra Medina MD, Talia     OVARIAN CYST, RIGHT 02/21/2009   Qualifier: Diagnosis of  By: Deborra Medina MD, Talia     PALPITATIONS, OCCASIONAL 04/17/2010   Qualifier: Diagnosis of  By: Deborra Medina MD, Talia     Pancreatitis 10/14/2017   Past Surgical History:  Procedure Laterality Date   BREAST EXCISIONAL BIOPSY Left 1997   ? left side, done at time of reduction , benign   BREAST REDUCTION SURGERY     bilateral   CESAREAN SECTION     CHOLECYSTECTOMY     COLONOSCOPY     ESOPHAGOGASTRODUODENOSCOPY     ESOPHAGOGASTRODUODENOSCOPY (EGD) WITH PROPOFOL N/A 10/21/2020   Procedure: ESOPHAGOGASTRODUODENOSCOPY (EGD) WITH PROPOFOL;  Surgeon: Lesly Rubenstein, MD;   Location: [facility] ENDOSCOPY;  Service: Endoscopy;  Laterality: N/A;   REDUCTION MAMMAPLASTY Bilateral 1997   VAGINAL HYSTERECTOMY     Patient Active Problem List   Diagnosis Date Noted   Abnormal MRI, lumbar spine (01/29/2020) 02/19/2020   Anxiety due to MRI 01/25/2020   Claustrophobia associated to MRI 01/25/2020   Enthesopathy of knee region (Right) 01/25/2020   Enthesopathy of knee region (Left) 01/25/2020   Lumbar facet hypertrophy 01/25/2020   Lumbosacral radiculopathy at S1 (Left) 01/17/2020   Lumbosacral radiculopathy (S1) (Left) 01/17/2020   Chronic lower extremity pain (Bilateral) (L>R) 01/17/2020   History of pancreatitis 07/18/2019   Obstructive sleep apnea syndrome 01/02/2019   Lumbar facet arthropathy (Multilevel) (Bilateral) 12/14/2018   Osteoarthritis involving multiple joints 11/28/2018   Chronic hip pain (Left) 11/16/2018   Acute hip pain (Left) 11/16/2018   DDD (degenerative disc disease), lumbosacral 11/16/2018   Chronic hip pain (Secondary source of pain) (Bilateral) (L>R) 05/19/2017   Elevated C-reactive protein (CRP) 05/19/2017   Elevated sed rate 05/19/2017   Spondylosis without myelopathy or radiculopathy, lumbosacral region 05/19/2017   Pharmacologic therapy 05/06/2017   Disorder of skeletal system 05/06/2017   Problems influencing health status 05/06/2017   Chest  pain with moderate risk of acute coronary syndrome 04/21/2017   DOE (dyspnea on exertion) 04/21/2017   Intermittent palpitations 04/21/2017   Vitamin D insufficiency 05/04/2016   Depression 02/04/2016   Fatty liver 02/04/2016   Hypertension associated with diabetes ([place]) 02/04/2016   Chronic pain syndrome 01/15/2016   Acute low back pain 05/28/2015   Lumbar transverse process fracture (HCC) (Left L1) (03/11/15) 05/28/2015   Long term current use of opiate analgesic 05/01/2015   Long term prescription opiate use 05/01/2015   Opiate use (25 MME/Day) 05/01/2015   Encounter for therapeutic drug  level monitoring 05/01/2015   Encounter for pain management planning 05/01/2015   Chronic low back pain (Primary Source of Pain) (Bilateral) (L>R) 05/01/2015   Lumbar facet syndrome (Bilateral) (L>R) 05/01/2015   Chronic knee pain [facility] source of pain) (Bilateral) (L>R) 05/01/2015   Lumbar spondylosis 05/01/2015   Neuropathic pain 05/01/2015   Neurogenic pain 05/01/2015   Myofascial pain 05/01/2015   Essential hypertriglyceridemia 04/26/2014   Type 2 diabetes mellitus with hyperglycemia, with long-term current use of insulin ([place]) 04/26/2014   Anxiety 05/15/2010   Obesity 05/15/2010   Hyperlipidemia 04/03/2010   Fatty liver disease 04/02/2010   Pure hypercholesterolemia 03/19/2010   GERD 04/17/2009   Constipation 04/17/2009   TOBACCO ABUSE 11/06/2008    ONSET DATE: 02/06/2021  REFERRING DIAG: Astrocytoma  THERAPY DIAG:  Muscle weakness (generalized)  Other lack of coordination  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HCC)  Rationale for Evaluation and Treatment Rehabilitation  SUBJECTIVE:     SUBJECTIVE STATEMENT: Pt and spouse report they have not tried getting pt in the bed at home yet.  They've both been overwhelmed with the holidays and haven't attempted any new routines yet.   Pt accompanied by: significant other  PERTINENT HISTORY:  Pt. Is a 52 y.o. 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 PMHx includes: Anorexia, dehydration, Adult Failure to thrive, contracture of muscles multiple sites. Pt./caregiver reports recently being hospitalized with a UTI, and dehydration.   PRECAUTIONS: Fall  WEIGHT BEARING RESTRICTIONS No  PAIN:  Are you having pain?  Pt. reports 6/10 pain in the entire R side.   FALLS: Has patient fallen in last 6 months? Yes. Number of falls Multiple Falls  LIVING ENVIRONMENT: Lives with: Family Lives in: House/apartment Stairs: Yes: External: 4 steps; bilateral but  cannot reach both Has following equipment at home: Single point cane, Walker - 4 wheeled, Hemi walker, shower chair, bed side commode, and Grab bars Lift chair in the tub, [place],  PLOF: Independent  PATIENT GOALS: To improve the right UE  OBJECTIVE:   HAND DOMINANCE: Right  ADLs: Transfers/ambulation related to ADLs: Eating: MaxA with cutting food, setting up, Modified I with nondominant Left hand, [place] Mat  Grooming: Independent  UB Dressing: MaxA  LB Dressing: MaxA Toileting: Uses a BSCommode mInA transfers, MaxA clothing negotiation Bathing: MaxA Tub Shower transfers:  Uses a shower lift chair Equipment:  w/c, BSCommode, cane   IADLs: Shopping: Husband performs Light housekeeping: Max Meal Prep: MaxA Community mobility: Relies on family Medication management:Husband assists Doctor, hospital: Performs together Handwriting: Not legible  MOBILITY STATUS: Hx of falls   ACTIVITY TOLERANCE: Activity tolerance: limited  FUNCTIONAL OUTCOME MEASURES: FOTO: 11.76  UPPER EXTREMITY ROM  : WFL Left AROM/PROM   Passive ROM Right eval Right  08/18/21 Right 09/08/2021 Right 10/30/2021 Right 02/03/22  Shoulder flexion 30   48 15 55  Shoulder  abduction 40 62 53 45 55  Shoulder adduction          Shoulder extension          Shoulder internal rotation          Shoulder external rotation          Elbow flexion 120   110 122 140  Elbow extension -67   -46 -72 -30  Wrist flexion Neutral   Neutral neutral 60  Wrist extension 12   40 25 28  Wrist ulnar deviation          Wrist radial deviation          Wrist pronation          Wrist supination          (Blank rows = not tested)     UPPER EXTREMITY MMT:      MMT Right eval Right 02/03/22 Left eval  Shoulder flexion 0/5 0/5 4+/5  Shoulder abduction 0/5 0/5 4+/5  Shoulder adduction       Shoulder extension       Shoulder internal rotation       Shoulder external rotation       Middle trapezius       Lower  trapezius       Elbow flexion 0/5 0/5 4+/5  Elbow extension 0/5 0/5 4+/5  Wrist flexion       Wrist extension 0/5 0/5 4+/5  Wrist ulnar deviation       Wrist radial deviation       Wrist pronation       Wrist supination       (Blank rows = not tested)   FOTO score: 09/08/2021: 11   FOTO score 02/03/22: 11.76  SENSATION: Light touch: WFL  EDEMA:   Wrist: 18.5 cm MCP: 21.5 cm  10/30/2021:  Wrist: 17.5 cm MCPs: 20cm  02/03/22:  Wrist: 17.3 cm MCP: 20.0 cm   MUSCLE TONE: RUE: Severe  COGNITION: Overall cognitive status: Impaired  VISION: Subjective report: Wears glasses Baseline vision: Wears glasses all the time Pt. Reports no changes   PRAXIS: Impaired: Motor planning  TODAY'S TREATMENT:  Moist heat applied to R shoulder and hand for pain management and muscle relaxation, intermittently throughout session during rest breaks from assessment activities.   Therapeutic Exercise: Objective measures taken and goals updated for recertification.    PATIENT EDUCATION: Education details: OT goals/poc for recert Person educated: Patient and Spouse Education method: explanation, vc Education comprehension: verbalized understanding   HOME EXERCISE PROGRAM:   Pt./caregiver education about positioning of the RUE, ROM  GOALS: Goals reviewed with patient? Yes  SHORT TERM GOALS: Target date: 03/17/22 Pt./caregivers will demonstrate independence with HEPs for UE ROM, and edema control Baseline: 02/03/22: Caregiver assist for RUE positioning to reduce edema.  Improved from initial eval.  Additional training needed for caregiver PROM throughout the RUE.  10/30/2021: Pt. Edema has improved. Pt. Requires caregiver assist for positioning. 09/08/2021: Pt. Requires assist for positioning, ROM, edema control. Eval: No current HEPs  Goal status: Ongoing  LONG TERM GOALS: Target date: 04/28/22 Pt. Will improve right hand edema by 1 cm at the wrist, and MCPs Baseline: 02/03/22: wrist 17.3 cm,  MCPs: 20 cm; 10/30/2021:  wrist: 17.5 cm, MCPs: 20 cm 09/08/2021: Wrist: 17.5 cm, MCPs: 20.5 cm Eval: Wrist: 18.5cm, MCPs: 21.5cm Goal status: Ongoing  2.  Pt. Will improve right shoulder PROM to 90 degrees for improved tolerance to UB ADLs. Baseline: 02/03/22: shoulder flexion  55, abd 55; 10/30/2021: shoulder flexion: 15, abduction: 45 09/08/2021: Right shoulder flexion: 48, abduction: 53 Eval: PROM shoulder flexion: 30 degrees, Abduction: 40 degrees Goal status: Ongoing/ revised  3.  Pt. Will improve right elbow PROM by 10 degrees to prepare for hand to mouth patterns Baseline: 02/03/22: elbow flex 140, ext -30; 10/30/2021: elbow extension -72, flexion: 122 09/08/2021: elbow extension: -46, flexion: 110 Eval:   elbow extension -67, flexion 120 Goal status: achieved  4.  Pt. Will improve PROM right wrist extension to 45 degrees or better to enable WB through the hand during transfers and ADLs. Baseline: 02/03/22: wrist ext 28; 10/30/2021: wrist extension 25 09/08/2021: Wrist extension: 40 Eval: wrist extension 12 degrees, flexion to neutral Goal status: Ongoing/ revised  5.  Pt. Will tolerate full digit extension in the right hand to promote good skin integrity through the palmar surface of the right hand  Baseline: 02/03/22: Pt tolerates ROM through ~75% of passive digit extension range; 10/30/2021: Pt. Tolerates ROM through 50% of  digit extension range, occasionally 75% 09/08/2021: Pt. Is able to tolerate PROM through 50% of digit extension in the digits. Eval: Pt. Maintains digits in tight flexion Goal status: Ongoing  6.  Pt. Will require minA UE dressing using one armed dressing techniques. Baseline: 02/03/22: max A pull over shirt; 10/30/2021: Independent with button down shirt/sweater/cardigan, MaxA pullover shirt.09/08/2021: MaxA Eval: MaxA Goal status: Ongoing   7. Pt will tolerate 15 min or more of supine lying for caregiver to perform PROM throughout the RUE and for benefits of pressure relief and  trunk extension.    Baseline: 02/03/22: pt has not yet attempted at home but was able to tolerate 10 min in supine on mat in clinic.  Goal status: New  ASSESSMENT:  CLINICAL IMPRESSION: Pt seen this date for recertification evaluation.  Though pt has made significant improvements with PROM throughout the RUE, ROM remains very limited in the shoulder, wrist and hand, and pt still has moderate to sometimes severe pain in the RUE during repositioning of the extremity during self care tasks.  Distal RUE remains moderately edematous, and with severe spasticity throughout the arm and noted 0/5 MMT throughout.  Pt is scheduled to try Botox next month.  Pt's posture is very rounded in the shoulders and trunk, also contributing to R shoulder ROM limitations.  Tried supine and side lying positioning when pt was last seen in Dec and pt was able to tolerate these positions for up to 10 min, but pt and spouse have not yet attempted this at home.  OT has encouraged caregiver to work towards supine positioning for pt for short periods in the home to allow for pressure relief and trunk extension to help with posture and shoulder mobility.  Caregiver is not yet consistently performing PROM to the RUE in the home.  Caregiver would benefit from review of PROM throughout the RUE and to develop a schedule for carryover.  Pt has had a number of cancellations related to symptom management throughout her cancer treatments.  Will continue to benefit from OT 1-2x per week to work towards improving pain, ROM, and to increase engagement of the RUE during ADLs, and IADLs.   PERFORMANCE DEFICITS in functional skills including ADLs, IADLs, coordination, proprioception, sensation, edema, tone, ROM, strength, pain, FMC, GMC, balance, decreased knowledge of use of DME, skin integrity, and UE functional use, cognitive skills including attention, memory, and safety awareness, and psychosocial skills including coping strategies.   IMPAIRMENTS  are limiting  patient from ADLs, IADLs, education, work, leisure, and social participation.   COMORBIDITIES may have co-morbidities  that affects occupational performance. Patient will benefit from skilled OT to address above impairments and improve overall function.  MODIFICATION OR ASSISTANCE TO COMPLETE EVALUATION: Maximum modification of tasks or assist with assess necessary to complete an evaluation.  OT OCCUPATIONAL PROFILE AND HISTORY: Detailed assessment: Review of records and additional review of physical, cognitive, psychosocial history related to current functional performance.  CLINICAL DECISION MAKING: High - multiple treatment options, significant modification of task necessary  REHAB POTENTIAL: Good for stated goals  EVALUATION COMPLEXITY: High    PLAN: OT FREQUENCY: 1-2x/week  OT DURATION: 12 weeks  PLANNED INTERVENTIONS: self care/ADL training, therapeutic exercise, therapeutic activity, neuromuscular re-education, manual therapy, passive range of motion, paraffin, moist heat, contrast bath, patient/family education, cognitive remediation/compensation, energy conservation, and coping strategies training  RECOMMENDED OTHER SERVICES: PT, and ST  CONSULTED AND AGREED WITH PLAN OF CARE: Patient and family member/caregiver  PLAN FOR NEXT SESSION: Initiate PROM, and RUE Edema control techniques   Leta Speller, MS, OTR/L  Darleene Cleaver, OT 02/05/2022, 9:03 AM

## 2022-02-03 NOTE — Therapy (Signed)
OUTPATIENT PHYSICAL THERAPY NEURO TREATMENT    Patient Name: Kelly Rollins MRN: [id number] DOB:02-Apr-1970, 52 y.o., female Today's Date: 02/03/2022   PCP: [name], PA-C REFERRING PROVIDER: [name], Bonnell Public, PA-C   PT End of Session - 02/03/22 1746     Visit Number 16    Number of Visits 89    Date for PT Re-Evaluation 03/12/22    Authorization Type [employer] Medicare    Authorization Time Period 09/16/21-12/09/21    PT Start Time 0402    PT Stop Time 0432    PT Time Calculation (min) 30 min    Equipment Utilized During Treatment Gait belt    Activity Tolerance Patient tolerated treatment well;Other (comment)   nausea   Behavior During Therapy [facility] for tasks assessed/performed                       Past Medical History:  Diagnosis Date   ABDOMINAL PAIN OTHER SPECIFIED SITE 02/21/2009   Qualifier: Diagnosis of  By: Deborra Medina MD, Talia     ABDOMINAL PAIN-LUQ 04/17/2009   Qualifier: Diagnosis of  By: Fuller Plan MD Lamont Snowball T    Anal fissure    Anginal pain ([place])    Asthma    Depression    Diabetes mellitus without complication ([place])    Dyspnea    Dysrhythmia    Headache(784.0)    HEMATOCHEZIA 04/17/2009   Qualifier: Diagnosis of  By: Fuller Plan MD Lamont Snowball T    Hyperlipidemia    Hypertension    IBS (irritable bowel syndrome)    Migraine headache 11/06/2008   Qualifier: Diagnosis of  By: Deborra Medina MD, Talia     OVARIAN CYST, RIGHT 02/21/2009   Qualifier: Diagnosis of  By: Deborra Medina MD, Talia     PALPITATIONS, OCCASIONAL 04/17/2010   Qualifier: Diagnosis of  By: Deborra Medina MD, Talia     Pancreatitis 10/14/2017   Past Surgical History:  Procedure Laterality Date   BREAST EXCISIONAL BIOPSY Left 1997   ? left side, done at time of reduction , benign   BREAST REDUCTION SURGERY     bilateral   CESAREAN SECTION     CHOLECYSTECTOMY     COLONOSCOPY     ESOPHAGOGASTRODUODENOSCOPY     ESOPHAGOGASTRODUODENOSCOPY (EGD) WITH PROPOFOL N/A 10/21/2020   Procedure:  ESOPHAGOGASTRODUODENOSCOPY (EGD) WITH PROPOFOL;  Surgeon: Lesly Rubenstein, MD;  Location: [facility] ENDOSCOPY;  Service: Endoscopy;  Laterality: N/A;   REDUCTION MAMMAPLASTY Bilateral 1997   VAGINAL HYSTERECTOMY     Patient Active Problem List   Diagnosis Date Noted   Abnormal MRI, lumbar spine (01/29/2020) 02/19/2020   Anxiety due to MRI 01/25/2020   Claustrophobia associated to MRI 01/25/2020   Enthesopathy of knee region (Right) 01/25/2020   Enthesopathy of knee region (Left) 01/25/2020   Lumbar facet hypertrophy 01/25/2020   Lumbosacral radiculopathy at S1 (Left) 01/17/2020   Lumbosacral radiculopathy (S1) (Left) 01/17/2020   Chronic lower extremity pain (Bilateral) (L>R) 01/17/2020   History of pancreatitis 07/18/2019   Obstructive sleep apnea syndrome 01/02/2019   Lumbar facet arthropathy (Multilevel) (Bilateral) 12/14/2018   Osteoarthritis involving multiple joints 11/28/2018   Chronic hip pain (Left) 11/16/2018   Acute hip pain (Left) 11/16/2018   DDD (degenerative disc disease), lumbosacral 11/16/2018   Chronic hip pain (Secondary source of pain) (Bilateral) (L>R) 05/19/2017   Elevated C-reactive protein (CRP) 05/19/2017   Elevated sed rate 05/19/2017   Spondylosis without myelopathy or radiculopathy, lumbosacral region 05/19/2017   Pharmacologic therapy 05/06/2017  Disorder of skeletal system 05/06/2017   Problems influencing health status 05/06/2017   Chest pain with moderate risk of acute coronary syndrome 04/21/2017   DOE (dyspnea on exertion) 04/21/2017   Intermittent palpitations 04/21/2017   Vitamin D insufficiency 05/04/2016   Depression 02/04/2016   Fatty liver 02/04/2016   Hypertension associated with diabetes ([place]) 02/04/2016   Chronic pain syndrome 01/15/2016   Acute low back pain 05/28/2015   Lumbar transverse process fracture (HCC) (Left L1) (03/11/15) 05/28/2015   Long term current use of opiate analgesic 05/01/2015   Long term prescription opiate use  05/01/2015   Opiate use (25 MME/Day) 05/01/2015   Encounter for therapeutic drug level monitoring 05/01/2015   Encounter for pain management planning 05/01/2015   Chronic low back pain (Primary Source of Pain) (Bilateral) (L>R) 05/01/2015   Lumbar facet syndrome (Bilateral) (L>R) 05/01/2015   Chronic knee pain [facility] source of pain) (Bilateral) (L>R) 05/01/2015   Lumbar spondylosis 05/01/2015   Neuropathic pain 05/01/2015   Neurogenic pain 05/01/2015   Myofascial pain 05/01/2015   Essential hypertriglyceridemia 04/26/2014   Type 2 diabetes mellitus with hyperglycemia, with long-term current use of insulin ([place]) 04/26/2014   Anxiety 05/15/2010   Obesity 05/15/2010   Hyperlipidemia 04/03/2010   Fatty liver disease 04/02/2010   Pure hypercholesterolemia 03/19/2010   GERD 04/17/2009   Constipation 04/17/2009   TOBACCO ABUSE 11/06/2008    ONSET DATE: January 2023  REFERRING DIAG: C71.9 (ICD-10-CM) - Malignant neoplasm of brain, unspecified  THERAPY DIAG:  Muscle weakness (generalized)  Unsteadiness on feet  Difficulty in walking, not elsewhere classified  Other lack of coordination  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 Pt reports things have been going very well. Pt reports continued pains (not new pain). Pt reports no falls. Pt spouse present reports about three weeks ago pain med dr wanted to reduce pain medicine, and pt's spouse reports she has reduced her mobility a bit since this as well. Pt spouse reports pt has been a little nauseous lately with chemo.  Pt accompanied by:  [name]  PAIN:  Are you having pain? 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 Pt's husband, [name], present with pt. [name] provides majority of hx as pt has difficulty with speech due to Broca's aphasia (working with SLP). Pt reports partial paralysis of R side. She is unable to move her RUE, RLE also affected. Pt ambulates with hemiwalker in their home for short distances and does not use WC at home. She fatigues quickly with ambulation. Pt has AFO for RLE but unable to determine how to donn it without it hurting her LE. Pt currently with wound on R calf covered by large bandage that they have been treating for at least a month (reports physician aware). In addition to wound on R calf, pt reports R side skin irritation under breast and lateral trunk due to bra underwire. They have since removed underwire. Pt was hospitalized 10 days in June due to UTI. She has had at least 12 falls in the last six months. Reports no injuries with falls, and states "just bumps and bruises." Pt reports she has constant head pain. Pt spouse reports pt has pain throughout her whole right side down to LE. Can have bad cramps in RUE and RLE. Reports no numbness/tingling. Occ dizziness, unable to describe further. Pt sleeps in recliner, wakes up stiff in the morning. PMH per chart includes: Anorexia, dehydration, Adult Failure to thrive, contracture of muscles multiple sites. Pt./caregiver reports recently being hospitalized with a UTI, and dehydration. Extensive hx, please refer to chart for full details.   PAIN: from eval Are you having pain? Yes: NPRS scale: 3/10 Pain location: Top of head, front Pain description: States, "cancer pain" head pain Aggravating factors: unsure Relieving factors: medications  PRECAUTIONS: Fall  WEIGHT BEARING RESTRICTIONS No  FALLS: Has patient fallen in last 6 months? Yes. Number of falls  12  LIVING ENVIRONMENT: information primarily from chart Lives with: lives with their spouse Lives in: House/apartment Stairs: Yes: External: 4 steps; bilateral but cannot reach both Has following equipment at home: Single point cane, Walker - 4 wheeled, Hemi walker, shower chair, bed side commode, Grab bars, and lift chair  PLOF: Independent  PATIENT GOALS  "Normal function" as much as possible. Improving her balance.    TODAY'S TREATMENT:   NMR:  Static stand at hemi-walker with CGA x 30 sec. Initial unsteadiness, improves with time   Ambulation in // bars with intermittent UE support (when weightbearing through RLE) FWD, then with UE support ambulating backward 4x length of bars each direction before requiring rest break  Ambulation with hemiwalker, close CGA for safety, WC follow approx 30 ft before requiring rest break due to nausea  TherEx STS 3x throughout session, requires up to mod assist  Session ended early due to pt feeling nauseated     PATIENT EDUCATION: Education details: Pt educated throughout session about proper posture and technique with exercises. Improved exercise technique, movement at target joints, use of target muscles after min to mod verbal, visual, tactile cues.   Person educated: pt and her spouse Education method:Explanation, Demonstration, Tactile cues, and Verbal cues, Education comprehension: verbalized understanding, returned demonstration, tactile cues required, and needs further education   HOME EXERCISE PROGRAM:  No updates today, pt to continue HEP as previously given  Access Code: [email or web address] URL: [email or web address] Date: 11/18/2021 Prepared by: Ricard Dillon  Exercises - Elbow Extension with Anchored Resistance  - 1 x daily - 5 x weekly - 3 sets - 10 reps  Access Code: [email or web address] URL: [email or web address] Date: 10/02/2021 Prepared by: Sande Brothers  Exercises - Seated Hip Flexion  March with  Ankle Weights  - 1 x daily - 7 x weekly - 3 sets - 10 reps - Seated Long Arc Quad  - 1 x daily - 7 x weekly - 3 sets - 10 reps - Supine Quad Set  - 1 x daily - 7 x weekly - 3 sets - 10 reps - Proper Sit to Stand Technique  - 1 x daily - 7 x weekly - 3 sets - 10 reps  GOALS: Goals reviewed with patient? Yes  SHORT TERM GOALS: Target date: 01/29/2022   Patient will be independent in home exercise program to improve strength/mobility for better functional independence with ADLs. Baseline: to be initiated next 1-2 sessions; 10/17 previously issued, pt is 11/16: to be advanced as pt has gained strength since previous assessment  Goal status: ONGOING   LONG TERM GOALS: Target date: 03/12/2022   Patient will demo ability to perform at least 1 STS by pushing from [facility] with LUE instead of pulling to stand to improve functional mobility Baseline: Goal changed as pt FOTO tracked by OT, with new goal: pt currently pulls to stand; 11/16: deferred due to pt feeling unwell; 11/22: able to complete with mod assist Goal status: ONGOING   2.   Patient (< 76 years old) will be able to perform and complete five times sit to stand test from standard chair height in <30 seconds to indicate increased LE strength and improved balance. Baseline: Pt able to complete only 1 stand, insufficient strength currently to complete full test; 10/17 31 seconds pull to stand with UE, close CGA-min a 11/16: deferred due to pt feeling unwell Goal status:  PARTIALLY MET  3.  Pt will improve LLE strength by at least 1/2 point on MMT in order to increase ease with transfers and functional mobility. Baseline: LLE grossly 4/5 throughout; 10/17: still generally 4/5 B, however now exhibits AROM with R hip flexion and knee ext; 11/16: 4+/5 LLE, RLE 3-/5, noted improvement however in quads and hip flexors  Goal status: MET  4.  Patient will increase 10 meter walk test to at least 0.80 m/s as to improve gait speed for better community  ambulation and to reduce fall risk. Baseline: to be tested next 1-2 visits; 10/17: pt unable to perform 11/16: deferred due to pt feeling unwell; 11/21: 0.17 m/s Goal status: IN PROGRESS  5.  Pt will complete STS from standard height chair with UUE assist off armrests and with no greater than min assist to improve ease and safety with transfers. Baseline: mod a x2; 10/17: pulling to stand with LUE CGA 11/16: deferred due to pt feeling unwell; 11/21: currently requires mod a to complete Goal status: IN PROGRESS,   6.  Pt will improve RLE strength by at least 1/2 point on MMT in order to increase ease with transfers and functional mobility. Baseline: 11/16: 4+/5 LLE, RLE 3-/5, noted improvement however in quads and hip flexors on R Goal status: NEW   ASSESSMENT:  CLINICAL IMPRESSION: Pt returns after absence from PT. Session somewhat limited at this time due to pt feeling nauseated. Pt did demonstrate ability to ambulate in // bars and briefly outside with hemi-walker and CGA, balance improving with reps/time. Pt did require up to mod a today for STS. The pt would benefit from continued skilled PT to address immobility, LE muscle weakness, and pain in order to decrease fall risk and increase functional mobility and QOL.   OBJECTIVE IMPAIRMENTS Abnormal gait, decreased activity tolerance, decreased  balance, decreased coordination, decreased endurance, decreased mobility, difficulty walking, decreased ROM, decreased strength, hypomobility, increased edema, increased fascial restrictions, increased muscle spasms, impaired flexibility, impaired sensation, impaired tone, impaired UE functional use, improper body mechanics, postural dysfunction, and pain.   ACTIVITY LIMITATIONS carrying, lifting, bending, sitting, standing, squatting, stairs, transfers, bed mobility, bathing, toileting, dressing, reach over head, hygiene/grooming, locomotion level, and caring for others  PARTICIPATION LIMITATIONS:  meal prep, cleaning, laundry, medication management, personal finances, driving, shopping, community activity, occupation, and yard work  PERSONAL Education officer, community, Past/current experiences, Sex, and 3+ comorbidities: Anorexia, dehydration, Adult Failure to thrive, contracture of muscles multiple sites, multiple falls, UTI, extensive PMH refer to chart  are also affecting patient's functional outcome.   REHAB POTENTIAL: Fair    CLINICAL DECISION MAKING: Evolving/moderate complexity  EVALUATION COMPLEXITY: High  PLAN: PT FREQUENCY: 2x/week  PT DURATION: 12 weeks  PLANNED INTERVENTIONS: Therapeutic exercises, Therapeutic activity, Neuromuscular re-education, Balance training, Gait training, Patient/Family education, Self Care, Joint mobilization, Joint manipulation, Stair training, Vestibular training, Canalith repositioning, Orthotic/Fit training, DME instructions, Wheelchair mobility training, Spinal mobilization, Cryotherapy, Moist heat, scar mobilization, Splintting, Taping, Manual therapy, and Re-evaluation  PLAN FOR NEXT SESSION:  stretching, strengthening, gait, balance, manual, standing balance 6:07 PM, 02/03/22   Zollie Pee, PT 02/03/2022, 6:07 PM

## 2022-02-05 ENCOUNTER — Ambulatory Visit: Payer: Medicare Other

## 2022-02-05 ENCOUNTER — Encounter: Payer: Medicare Other | Admitting: Speech Pathology

## 2022-02-05 DIAGNOSIS — R262 Difficulty in walking, not elsewhere classified: Secondary | ICD-10-CM

## 2022-02-05 DIAGNOSIS — M6281 Muscle weakness (generalized): Secondary | ICD-10-CM | POA: Diagnosis not present

## 2022-02-05 DIAGNOSIS — R2681 Unsteadiness on feet: Secondary | ICD-10-CM

## 2022-02-05 DIAGNOSIS — R278 Other lack of coordination: Secondary | ICD-10-CM

## 2022-02-05 NOTE — Therapy (Signed)
OUTPATIENT PHYSICAL THERAPY NEURO TREATMENT    Patient Name: Kelly Rollins MRN: [id number] DOB:01-14-1971, 52 y.o., female Today's Date: 02/05/2022   PCP: Kelly Rossetti, PA-C REFERRING PROVIDER: Elnora Rollins, Kelly Public, PA-C   PT End of Session - 02/05/22 1610     Visit Number 17    Number of Visits 108    Date for PT Re-Evaluation 03/12/22    Authorization Type [employer] Medicare    Authorization Time Period 09/16/21-12/09/21    PT Start Time 1517    PT Stop Time 1603    PT Time Calculation (min) 46 min    Equipment Utilized During Treatment Gait belt    Activity Tolerance Patient tolerated treatment well    Behavior During Therapy [facility] for tasks assessed/performed                       Past Medical History:  Diagnosis Date   ABDOMINAL PAIN OTHER SPECIFIED SITE 02/21/2009   Qualifier: Diagnosis of  By: Kelly Rollins, Kelly Rollins     ABDOMINAL PAIN-LUQ 04/17/2009   Qualifier: Diagnosis of  By: Kelly Rollins Kelly Rollins    Anal fissure    Anginal pain ([place])    Asthma    Depression    Diabetes mellitus without complication ([place])    Dyspnea    Dysrhythmia    Headache(784.0)    HEMATOCHEZIA 04/17/2009   Qualifier: Diagnosis of  By: Kelly Rollins Kelly Rollins    Hyperlipidemia    Hypertension    IBS (irritable bowel syndrome)    Migraine headache 11/06/2008   Qualifier: Diagnosis of  By: Kelly Rollins, Kelly Rollins     OVARIAN CYST, RIGHT 02/21/2009   Qualifier: Diagnosis of  By: Kelly Rollins, Kelly Rollins     PALPITATIONS, OCCASIONAL 04/17/2010   Qualifier: Diagnosis of  By: Kelly Rollins, Kelly Rollins     Pancreatitis 10/14/2017   Past Surgical History:  Procedure Laterality Date   BREAST EXCISIONAL BIOPSY Left 1997   ? left side, done at time of reduction , benign   BREAST REDUCTION SURGERY     bilateral   CESAREAN SECTION     CHOLECYSTECTOMY     COLONOSCOPY     ESOPHAGOGASTRODUODENOSCOPY     ESOPHAGOGASTRODUODENOSCOPY (EGD) WITH PROPOFOL N/A 10/21/2020   Procedure:  ESOPHAGOGASTRODUODENOSCOPY (EGD) WITH PROPOFOL;  Surgeon: Kelly Rubenstein, Rollins;  Location: [facility] ENDOSCOPY;  Service: Endoscopy;  Laterality: N/A;   REDUCTION MAMMAPLASTY Bilateral 1997   VAGINAL HYSTERECTOMY     Patient Active Problem List   Diagnosis Date Noted   Abnormal MRI, lumbar spine (01/29/2020) 02/19/2020   Anxiety due to MRI 01/25/2020   Claustrophobia associated to MRI 01/25/2020   Enthesopathy of knee region (Right) 01/25/2020   Enthesopathy of knee region (Left) 01/25/2020   Lumbar facet hypertrophy 01/25/2020   Lumbosacral radiculopathy at S1 (Left) 01/17/2020   Lumbosacral radiculopathy (S1) (Left) 01/17/2020   Chronic lower extremity pain (Bilateral) (L>R) 01/17/2020   History of pancreatitis 07/18/2019   Obstructive sleep apnea syndrome 01/02/2019   Lumbar facet arthropathy (Multilevel) (Bilateral) 12/14/2018   Osteoarthritis involving multiple joints 11/28/2018   Chronic hip pain (Left) 11/16/2018   Acute hip pain (Left) 11/16/2018   DDD (degenerative disc disease), lumbosacral 11/16/2018   Chronic hip pain (Secondary source of pain) (Bilateral) (L>R) 05/19/2017   Elevated C-reactive protein (CRP) 05/19/2017   Elevated sed rate 05/19/2017   Spondylosis without myelopathy or radiculopathy, lumbosacral region 05/19/2017   Pharmacologic therapy 05/06/2017   Disorder  of skeletal system 05/06/2017   Problems influencing health status 05/06/2017   Chest pain with moderate risk of acute coronary syndrome 04/21/2017   DOE (dyspnea on exertion) 04/21/2017   Intermittent palpitations 04/21/2017   Vitamin D insufficiency 05/04/2016   Depression 02/04/2016   Fatty liver 02/04/2016   Hypertension associated with diabetes ([place]) 02/04/2016   Chronic pain syndrome 01/15/2016   Acute low back pain 05/28/2015   Lumbar transverse process fracture (HCC) (Left L1) (03/11/15) 05/28/2015   Long term current use of opiate analgesic 05/01/2015   Long term prescription opiate use  05/01/2015   Opiate use (25 MME/Day) 05/01/2015   Encounter for therapeutic drug level monitoring 05/01/2015   Encounter for pain management planning 05/01/2015   Chronic low back pain (Primary Source of Pain) (Bilateral) (L>R) 05/01/2015   Lumbar facet syndrome (Bilateral) (L>R) 05/01/2015   Chronic knee pain [facility] source of pain) (Bilateral) (L>R) 05/01/2015   Lumbar spondylosis 05/01/2015   Neuropathic pain 05/01/2015   Neurogenic pain 05/01/2015   Myofascial pain 05/01/2015   Essential hypertriglyceridemia 04/26/2014   Type 2 diabetes mellitus with hyperglycemia, with long-term current use of insulin ([place]) 04/26/2014   Anxiety 05/15/2010   Obesity 05/15/2010   Hyperlipidemia 04/03/2010   Fatty liver disease 04/02/2010   Pure hypercholesterolemia 03/19/2010   GERD 04/17/2009   Constipation 04/17/2009   TOBACCO ABUSE 11/06/2008    ONSET DATE: January 2023  REFERRING DIAG: C71.9 (ICD-10-CM) - Malignant neoplasm of brain, unspecified  THERAPY DIAG:  Muscle weakness (generalized)  Other lack of coordination  Unsteadiness on feet  Difficulty in walking, not elsewhere classified  Rationale for Evaluation and Treatment Rehabilitation  SUBJECTIVE:                                                                                                                                                                                              SUBJECTIVE STATEMENT: Pt reports she is good today. Reports no stumbles falls or other concerns.   Pt accompanied by:  Kelly Rollins  PAIN:  Are you having pain? 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 Pt's husband, Kelly Rollins, present with pt. Kelly Rollins provides majority of hx as pt has difficulty with speech due to Broca's aphasia (working with SLP). Pt reports partial  paralysis of R side. She is unable to move her RUE, RLE also affected. Pt ambulates with hemiwalker in their home for short distances and does not use WC at home. She  fatigues quickly with ambulation. Pt has AFO for RLE but unable to determine how to donn it without it hurting her LE. Pt currently with wound on R calf covered by large bandage that they have been treating for at least a month (reports physician aware). In addition to wound on R calf, pt reports R side skin irritation under breast and lateral trunk due to bra underwire. They have since removed underwire. Pt was hospitalized 10 days in June due to UTI. She has had at least 12 falls in the last six months. Reports no injuries with falls, and states "just bumps and bruises." Pt reports she has constant head pain. Pt spouse reports pt has pain throughout her whole right side down to LE. Can have bad cramps in RUE and RLE. Reports no numbness/tingling. Occ dizziness, unable to describe further. Pt sleeps in recliner, wakes up stiff in the morning. PMH per chart includes: Anorexia, dehydration, Adult Failure to thrive, contracture of muscles multiple sites. Pt./caregiver reports recently being hospitalized with a UTI, and dehydration. Extensive hx, please refer to chart for full details.   PAIN: from eval Are you having pain? Yes: NPRS scale: 3/10 Pain location: Top of head, front Pain description: States, "cancer pain" head pain Aggravating factors: unsure Relieving factors: medications  PRECAUTIONS: Fall  WEIGHT BEARING RESTRICTIONS No  FALLS: Has patient fallen in last 6 months? Yes. Number of falls 12  LIVING ENVIRONMENT: information primarily from chart Lives with: lives with their spouse Lives in: House/apartment Stairs: Yes: External: 4 steps; bilateral but cannot reach both Has following equipment at home: Single point cane, Walker - 4 wheeled, Hemi walker, shower chair, bed side commode, Grab bars, and lift chair  PLOF:  Independent  PATIENT GOALS  "Normal function" as much as possible. Improving her balance.    TODAY'S TREATMENT:   TherEx: Pt performs several minutes of the following-  LAQ each LE x multiple reps, TC for RLE and occ hands-on assist for knee ext and flexion Seated March x multiple reps each LE. TC for RLE and occ pt provides hands-on assist to increase AAROM, pt then exhibits improved AROM following this Soccer ball kicks each LE for sequencing and mm power  -1.5# donned to each LE: --March each LE --LAQ each LE --Soccer ball kicks. Additionally, RTB added to load knee flexion prior to kick for increased challenge and TC  In session pt reports feeling pain near sacrum, she reports she thinks it is due to extended time seated in WC. PT advises pt and spouse signs and symptoms to watch out for regarding wound development (pt with hx of RLE calf wound that has now healed per report). PT advises pt to contact wound therapist and/or physician should pain continue and not be relieved by position changes/time off area.   NMR:  Ambulation with hemiwalker, close CGA for safety, WC follow 1x47 ft. Adjustment of height of hemiwalker prior to intervention.  PATIENT EDUCATION: Education details: Pt educated throughout session about proper posture and technique with exercises. Improved exercise technique, movement at target joints, use of target muscles after min to mod verbal, visual, tactile cues.    Person educated: pt and her spouse Education method:Explanation, Demonstration, Tactile cues, and Verbal cues, Education comprehension: verbalized understanding, returned demonstration, tactile cues required, and needs further education   HOME EXERCISE PROGRAM:  No updates today, pt to continue HEP as previously given  Access Code: [email or web address] URL: [email or web address] Date: 11/18/2021 Prepared by: Ricard Dillon  Exercises - Elbow  Extension with Anchored Resistance  - 1 x daily - 5  x weekly - 3 sets - 10 reps  Access Code: [email or web address] URL: [email or web address] Date: 10/02/2021 Prepared by: Sande Brothers  Exercises - Seated Hip Flexion March with Ankle Weights  - 1 x daily - 7 x weekly - 3 sets - 10 reps - Seated Long Arc Quad  - 1 x daily - 7 x weekly - 3 sets - 10 reps - Supine Quad Set  - 1 x daily - 7 x weekly - 3 sets - 10 reps - Proper Sit to Stand Technique  - 1 x daily - 7 x weekly - 3 sets - 10 reps  GOALS: Goals reviewed with patient? Yes  SHORT TERM GOALS: Target date: 01/29/2022   Patient will be independent in home exercise program to improve strength/mobility for better functional independence with ADLs. Baseline: to be initiated next 1-2 sessions; 10/17 previously issued, pt is 11/16: to be advanced as pt has gained strength since previous assessment  Goal status: ONGOING   LONG TERM GOALS: Target date: 03/12/2022   Patient will demo ability to perform at least 1 STS by pushing from [facility] with LUE instead of pulling to stand to improve functional mobility Baseline: Goal changed as pt FOTO tracked by OT, with new goal: pt currently pulls to stand; 11/16: deferred due to pt feeling unwell; 11/22: able to complete with mod assist Goal status: ONGOING   2.   Patient (< 34 years old) will be able to perform and complete five times sit to stand test from standard chair height in <30 seconds to indicate increased LE strength and improved balance. Baseline: Pt able to complete only 1 stand, insufficient strength currently to complete full test; 10/17 31 seconds pull to stand with UE, close CGA-min a 11/16: deferred due to pt feeling unwell Goal status:  PARTIALLY MET  3.  Pt will improve LLE strength by at least 1/2 point on MMT in order to increase ease with transfers and functional mobility. Baseline: LLE grossly 4/5 throughout; 10/17: still generally 4/5 B, however now exhibits AROM with R hip flexion and knee ext; 11/16: 4+/5 LLE, RLE  3-/5, noted improvement however in quads and hip flexors  Goal status: MET  4.  Patient will increase 10 meter walk test to at least 0.80 m/s as to improve gait speed for better community ambulation and to reduce fall risk. Baseline: to be tested next 1-2 visits; 10/17: pt unable to perform 11/16: deferred due to pt feeling unwell; 11/21: 0.17 m/s Goal status: IN PROGRESS  5.  Pt will complete STS from standard height chair with UUE assist off armrests and with no greater than min assist to improve ease and safety with transfers. Baseline: mod a x2; 10/17: pulling to stand with LUE CGA 11/16: deferred due to pt feeling unwell; 11/21: currently requires mod a to complete Goal status: IN PROGRESS,   6.  Pt will improve RLE strength by at least 1/2 point on MMT in order to increase ease with transfers and functional mobility. Baseline: 11/16: 4+/5 LLE, RLE 3-/5, noted improvement however in quads and hip flexors on R Goal status: NEW   ASSESSMENT:  CLINICAL IMPRESSION: Pt highly motivated to participate in session and tolerates interventions well today without pain or nausea. Pt able to ambulate following therex, indicating improved activity tolerance. Pt noted to have increased RLE tone with knee flex/ext based interventions. Pt spouse reports she does have upcoming appt for botox  injections but unsure if this is for UE or LE. PT encourages pt and spouse to ask physician at appointment if LE botox could be beneficial for pt. The pt would benefit from continued skilled PT to address immobility, LE muscle weakness, and pain in order to decrease fall risk and increase functional mobility and QOL.   OBJECTIVE IMPAIRMENTS Abnormal gait, decreased activity tolerance, decreased balance, decreased coordination, decreased endurance, decreased mobility, difficulty walking, decreased ROM, decreased strength, hypomobility, increased edema, increased fascial restrictions, increased muscle spasms, impaired  flexibility, impaired sensation, impaired tone, impaired UE functional use, improper body mechanics, postural dysfunction, and pain.   ACTIVITY LIMITATIONS carrying, lifting, bending, sitting, standing, squatting, stairs, transfers, bed mobility, bathing, toileting, dressing, reach over head, hygiene/grooming, locomotion level, and caring for others  PARTICIPATION LIMITATIONS: meal prep, cleaning, laundry, medication management, personal finances, driving, shopping, community activity, occupation, and yard work  PERSONAL Education officer, community, Past/current experiences, Sex, and 3+ comorbidities: Anorexia, dehydration, Adult Failure to thrive, contracture of muscles multiple sites, multiple falls, UTI, extensive PMH refer to chart  are also affecting patient's functional outcome.   REHAB POTENTIAL: Fair    CLINICAL DECISION MAKING: Evolving/moderate complexity  EVALUATION COMPLEXITY: High  PLAN: PT FREQUENCY: 2x/week  PT DURATION: 12 weeks  PLANNED INTERVENTIONS: Therapeutic exercises, Therapeutic activity, Neuromuscular re-education, Balance training, Gait training, Patient/Family education, Self Care, Joint mobilization, Joint manipulation, Stair training, Vestibular training, Canalith repositioning, Orthotic/Fit training, DME instructions, Wheelchair mobility training, Spinal mobilization, Cryotherapy, Moist heat, scar mobilization, Splintting, Taping, Manual therapy, and Re-evaluation  PLAN FOR NEXT SESSION:  stretching, strengthening, gait, balance, manual, standing balance 4:20 PM, 02/05/22   Zollie Pee, PT 02/05/2022, 4:20 PM

## 2022-02-10 ENCOUNTER — Ambulatory Visit: Payer: Medicare Other

## 2022-02-10 ENCOUNTER — Encounter: Payer: Medicare Other | Admitting: Speech Pathology

## 2022-02-12 ENCOUNTER — Telehealth: Payer: Self-pay

## 2022-02-12 ENCOUNTER — Encounter: Payer: Medicare Other | Admitting: Speech Pathology

## 2022-02-12 ENCOUNTER — Ambulatory Visit: Payer: Medicare Other

## 2022-02-12 DIAGNOSIS — M6281 Muscle weakness (generalized): Secondary | ICD-10-CM | POA: Diagnosis not present

## 2022-02-12 DIAGNOSIS — R278 Other lack of coordination: Secondary | ICD-10-CM

## 2022-02-12 DIAGNOSIS — C719 Malignant neoplasm of brain, unspecified: Secondary | ICD-10-CM

## 2022-02-12 DIAGNOSIS — R2681 Unsteadiness on feet: Secondary | ICD-10-CM

## 2022-02-12 DIAGNOSIS — R262 Difficulty in walking, not elsewhere classified: Secondary | ICD-10-CM

## 2022-02-12 NOTE — Telephone Encounter (Signed)
PT contacted  [facility] to follow-up regarding power wheelchair orders/referral. PT previously contacted pt's PCP office, however, they did not write the original order, this actually came from [facility] (they returned this info to front office staff). PT provided fax and contact information to [facility], they plan to fax orders/referral.  Ricard Dillon PT, DPT

## 2022-02-12 NOTE — Telephone Encounter (Signed)
PT contacted pt's PCP office via secure phone line to follow-up on referral for power wheelchair since clinic did not receive it. PT spoke with nursing who reports they will follow-up on this issue.   Ricard Dillon PT, DPT

## 2022-02-12 NOTE — Therapy (Signed)
OUTPATIENT PHYSICAL THERAPY NEURO TREATMENT    Patient Name: Kelly Rollins MRN: [id number] DOB:08-17-1970, 52 y.o., female Today's Date: 02/12/2022   PCP: Kelly Rossetti, PA-C REFERRING PROVIDER: Elnora Rollins, Kelly Public, PA-C   PT End of Session - 02/12/22 1637     Visit Number 18    Number of Visits 10    Date for PT Re-Evaluation 03/12/22    Authorization Type [employer] Medicare    Authorization Time Period 09/16/21-12/09/21    PT Start Time 1531    PT Stop Time 1606    PT Time Calculation (min) 35 min    Equipment Utilized During Treatment Gait belt    Activity Tolerance Patient tolerated treatment well;Patient limited by fatigue    Behavior During Therapy [facility] for tasks assessed/performed                        Past Medical History:  Diagnosis Date   ABDOMINAL PAIN OTHER SPECIFIED SITE 02/21/2009   Qualifier: Diagnosis of  By: Kelly Medina MD, Talia     ABDOMINAL PAIN-LUQ 04/17/2009   Qualifier: Diagnosis of  By: Kelly Plan MD Kelly Rollins    Anal fissure    Anginal pain ([place])    Asthma    Depression    Diabetes mellitus without complication ([place])    Dyspnea    Dysrhythmia    Headache(784.0)    HEMATOCHEZIA 04/17/2009   Qualifier: Diagnosis of  By: Kelly Plan MD Kelly Rollins    Hyperlipidemia    Hypertension    IBS (irritable bowel syndrome)    Migraine headache 11/06/2008   Qualifier: Diagnosis of  By: Kelly Medina MD, Talia     OVARIAN CYST, RIGHT 02/21/2009   Qualifier: Diagnosis of  By: Kelly Medina MD, Talia     PALPITATIONS, OCCASIONAL 04/17/2010   Qualifier: Diagnosis of  By: Kelly Medina MD, Talia     Pancreatitis 10/14/2017   Past Surgical History:  Procedure Laterality Date   BREAST EXCISIONAL BIOPSY Left 1997   ? left side, done at time of reduction , benign   BREAST REDUCTION SURGERY     bilateral   CESAREAN SECTION     CHOLECYSTECTOMY     COLONOSCOPY     ESOPHAGOGASTRODUODENOSCOPY     ESOPHAGOGASTRODUODENOSCOPY (EGD) WITH PROPOFOL N/A 10/21/2020    Procedure: ESOPHAGOGASTRODUODENOSCOPY (EGD) WITH PROPOFOL;  Surgeon: Kelly Rubenstein, MD;  Location: [facility] ENDOSCOPY;  Service: Endoscopy;  Laterality: N/A;   REDUCTION MAMMAPLASTY Bilateral 1997   VAGINAL HYSTERECTOMY     Patient Active Problem List   Diagnosis Date Noted   Abnormal MRI, lumbar spine (01/29/2020) 02/19/2020   Anxiety due to MRI 01/25/2020   Claustrophobia associated to MRI 01/25/2020   Enthesopathy of knee region (Right) 01/25/2020   Enthesopathy of knee region (Left) 01/25/2020   Lumbar facet hypertrophy 01/25/2020   Lumbosacral radiculopathy at S1 (Left) 01/17/2020   Lumbosacral radiculopathy (S1) (Left) 01/17/2020   Chronic lower extremity pain (Bilateral) (L>R) 01/17/2020   History of pancreatitis 07/18/2019   Obstructive sleep apnea syndrome 01/02/2019   Lumbar facet arthropathy (Multilevel) (Bilateral) 12/14/2018   Osteoarthritis involving multiple joints 11/28/2018   Chronic hip pain (Left) 11/16/2018   Acute hip pain (Left) 11/16/2018   DDD (degenerative disc disease), lumbosacral 11/16/2018   Chronic hip pain (Secondary source of pain) (Bilateral) (L>R) 05/19/2017   Elevated C-reactive protein (CRP) 05/19/2017   Elevated sed rate 05/19/2017   Spondylosis without myelopathy or radiculopathy, lumbosacral region 05/19/2017   Pharmacologic therapy  05/06/2017   Disorder of skeletal system 05/06/2017   Problems influencing health status 05/06/2017   Chest pain with moderate risk of acute coronary syndrome 04/21/2017   DOE (dyspnea on exertion) 04/21/2017   Intermittent palpitations 04/21/2017   Vitamin D insufficiency 05/04/2016   Depression 02/04/2016   Fatty liver 02/04/2016   Hypertension associated with diabetes ([place]) 02/04/2016   Chronic pain syndrome 01/15/2016   Acute low back pain 05/28/2015   Lumbar transverse process fracture (HCC) (Left L1) (03/11/15) 05/28/2015   Long term current use of opiate analgesic 05/01/2015   Long term prescription  opiate use 05/01/2015   Opiate use (25 MME/Day) 05/01/2015   Encounter for therapeutic drug level monitoring 05/01/2015   Encounter for pain management planning 05/01/2015   Chronic low back pain (Primary Source of Pain) (Bilateral) (L>R) 05/01/2015   Lumbar facet syndrome (Bilateral) (L>R) 05/01/2015   Chronic knee pain [place] source of pain) (Bilateral) (L>R) 05/01/2015   Lumbar spondylosis 05/01/2015   Neuropathic pain 05/01/2015   Neurogenic pain 05/01/2015   Myofascial pain 05/01/2015   Essential hypertriglyceridemia 04/26/2014   Type 2 diabetes mellitus with hyperglycemia, with long-term current use of insulin ([place]) 04/26/2014   Anxiety 05/15/2010   Obesity 05/15/2010   Hyperlipidemia 04/03/2010   Fatty liver disease 04/02/2010   Pure hypercholesterolemia 03/19/2010   GERD 04/17/2009   Constipation 04/17/2009   TOBACCO ABUSE 11/06/2008    ONSET DATE: January 2023  REFERRING DIAG: C71.9 (ICD-10-CM) - Malignant neoplasm of brain, unspecified  THERAPY DIAG:  Muscle weakness (generalized)  Other lack of coordination  Unsteadiness on feet  Difficulty in walking, not elsewhere classified  Rationale for Evaluation and Treatment Rehabilitation  SUBJECTIVE:                                                                                                                                                                                              SUBJECTIVE STATEMENT: Pt reports no stumbles/falls. Pt reports pain-level is OK at the moment. Pt's stomach is feeling OK.  Pt accompanied by:  Kelly Rollins  PAIN:  Are you having pain? 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 Pt's husband, Kelly Rollins, present with pt. Kelly Rollins provides majority of hx as pt has difficulty with speech due to Broca's aphasia  (working with SLP). Pt reports partial paralysis of R side. She is unable to move her RUE, RLE also affected. Pt ambulates with hemiwalker in their home for short distances and  does not use WC at home. She fatigues quickly with ambulation. Pt has AFO for RLE but unable to determine how to donn it without it hurting her LE. Pt currently with wound on R calf covered by large bandage that they have been treating for at least a month (reports physician aware). In addition to wound on R calf, pt reports R side skin irritation under breast and lateral trunk due to bra underwire. They have since removed underwire. Pt was hospitalized 10 days in June due to UTI. She has had at least 12 falls in the last six months. Reports no injuries with falls, and states "just bumps and bruises." Pt reports she has constant head pain. Pt spouse reports pt has pain throughout her whole right side down to LE. Can have bad cramps in RUE and RLE. Reports no numbness/tingling. Occ dizziness, unable to describe further. Pt sleeps in recliner, wakes up stiff in the morning. PMH per chart includes: Anorexia, dehydration, Adult Failure to thrive, contracture of muscles multiple sites. Pt./caregiver reports recently being hospitalized with a UTI, and dehydration. Extensive hx, please refer to chart for full details.   PAIN: from eval Are you having pain? Yes: NPRS scale: 3/10 Pain location: Top of head, front Pain description: States, "cancer pain" head pain Aggravating factors: unsure Relieving factors: medications  PRECAUTIONS: Fall  WEIGHT BEARING RESTRICTIONS No  FALLS: Has patient fallen in last 6 months? Yes. Number of falls 12  LIVING ENVIRONMENT: information primarily from chart Lives with: lives with their spouse Lives in: House/apartment Stairs: Yes: External: 4 steps; bilateral but cannot reach both Has following equipment at home: Single point cane, Walker - 4 wheeled, Hemi walker, shower chair, bed side commode,  Grab bars, and lift chair  PLOF: Independent  PATIENT GOALS  "Normal function" as much as possible. Improving her balance.    TODAY'S TREATMENT:   TherEx:  RLE long-duration stretch supported on chair x 2 min  PT provides pt with new printout of LE therex HEP and reviews it with her and her spouse. Instructs pt to only perform as many STS per set as she is able.   Seat hip flexion march 3x10 each LE. Pt able to complete without assist LLE, PT provides hands-on assist for RLE ROM. Limited by increased RLE tone. --RLE hip flexion march eccentrics with PT assisting pt through concentric component 5x  Seated LAQ 2x10 each LE with TC for RLE.  --Pt then performs 10x LLE with 2# AW. Challenging for pt.  Seated soccer ball kicks RLE to promote knee ext/quad activation x multiple reps. Pt continues to show improvement in quad activation with external cue (soccer ball)   STS at support surface (pull-to-stand technique) 5x. Very fatiguing for pt.   Session ended early due to pt fatigue.   PATIENT EDUCATION: Education details: Pt educated throughout session about proper posture and technique with exercises. Improved exercise technique, movement at target joints, use of target muscles after min to mod verbal, visual, tactile cues.    Person educated: pt and her spouse Education method:Explanation, Demonstration, Tactile cues, and Verbal cues, Education comprehension: verbalized understanding, returned demonstration, tactile cues required, and needs further education   HOME EXERCISE PROGRAM:  Pt to perform only as many reps of STS per set as able  Access Code: [email or web address] URL: [email or web address] Date: 11/18/2021 Prepared by: Ricard Dillon  Exercises - Elbow Extension with Anchored Resistance  - 1 x daily - 5 x weekly - 3 sets - 10 reps  Access Code: [email or web address] URL: [email or web address] Date: 10/02/2021 Prepared by: Sande Brothers  Exercises - Seated  Hip Flexion March with Ankle Weights  - 1 x daily - 7 x weekly - 3 sets - 10 reps - Seated Long Arc Quad  - 1 x daily - 7 x weekly - 3 sets - 10 reps - Supine Quad Set  - 1 x daily - 7 x weekly - 3 sets - 10 reps - Proper Sit to Stand Technique  - 1 x daily - 7 x weekly - 3 sets - 10 reps  GOALS: Goals reviewed with patient? Yes  SHORT TERM GOALS: Target date: 01/29/2022   Patient will be independent in home exercise program to improve strength/mobility for better functional independence with ADLs. Baseline: to be initiated next 1-2 sessions; 10/17 previously issued, pt is 11/16: to be advanced as pt has gained strength since previous assessment  Goal status: ONGOING   LONG TERM GOALS: Target date: 03/12/2022   Patient will demo ability to perform at least 1 STS by pushing from [facility] with LUE instead of pulling to stand to improve functional mobility Baseline: Goal changed as pt FOTO tracked by OT, with new goal: pt currently pulls to stand; 11/16: deferred due to pt feeling unwell; 11/22: able to complete with mod assist Goal status: ONGOING   2.   Patient (< 59 years old) will be able to perform and complete five times sit to stand test from standard chair height in <30 seconds to indicate increased LE strength and improved balance. Baseline: Pt able to complete only 1 stand, insufficient strength currently to complete full test; 10/17 31 seconds pull to stand with UE, close CGA-min a 11/16: deferred due to pt feeling unwell Goal status:  PARTIALLY MET  3.  Pt will improve LLE strength by at least 1/2 point on MMT in order to increase ease with transfers and functional mobility. Baseline: LLE grossly 4/5 throughout; 10/17: still generally 4/5 B, however now exhibits AROM with R hip flexion and knee ext; 11/16: 4+/5 LLE, RLE 3-/5, noted improvement however in quads and hip flexors  Goal status: MET  4.  Patient will increase 10 meter walk test to at least 0.80 m/s as to improve gait speed  for better community ambulation and to reduce fall risk. Baseline: to be tested next 1-2 visits; 10/17: pt unable to perform 11/16: deferred due to pt feeling unwell; 11/21: 0.17 m/s Goal status: IN PROGRESS  5.  Pt will complete STS from standard height chair with UUE assist off armrests and with no greater than min assist to improve ease and safety with transfers. Baseline: mod a x2; 10/17: pulling to stand with LUE CGA 11/16: deferred due to pt feeling unwell; 11/21: currently requires mod a to complete Goal status: IN PROGRESS,   6.  Pt will improve RLE strength by at least 1/2 point on MMT in order to increase ease with transfers and functional mobility. Baseline: 11/16: 4+/5 LLE, RLE 3-/5, noted improvement however in quads and hip flexors on R Goal status: NEW   ASSESSMENT:  CLINICAL IMPRESSION: Today's session somewhat limited due to pt's increased fatigue. PT spent time reviewing LE HEP with pt, including modifications (see above), as pt will likely have to decrease frequency for a while due to insurance related limitations. PT also provided education to pt's spouse on how to safely assist pt with HEP. They both verbalized understanding and pt demonstrated correct technique with exercises. The pt would benefit from  continued skilled PT to address immobility, LE muscle weakness, and pain in order to decrease fall risk and increase functional mobility and QOL.   OBJECTIVE IMPAIRMENTS Abnormal gait, decreased activity tolerance, decreased balance, decreased coordination, decreased endurance, decreased mobility, difficulty walking, decreased ROM, decreased strength, hypomobility, increased edema, increased fascial restrictions, increased muscle spasms, impaired flexibility, impaired sensation, impaired tone, impaired UE functional use, improper body mechanics, postural dysfunction, and pain.   ACTIVITY LIMITATIONS carrying, lifting, bending, sitting, standing, squatting, stairs, transfers,  bed mobility, bathing, toileting, dressing, reach over head, hygiene/grooming, locomotion level, and caring for others  PARTICIPATION LIMITATIONS: meal prep, cleaning, laundry, medication management, personal finances, driving, shopping, community activity, occupation, and yard work  PERSONAL Education officer, community, Past/current experiences, Sex, and 3+ comorbidities: Anorexia, dehydration, Adult Failure to thrive, contracture of muscles multiple sites, multiple falls, UTI, extensive PMH refer to chart  are also affecting patient's functional outcome.   REHAB POTENTIAL: Fair    CLINICAL DECISION MAKING: Evolving/moderate complexity  EVALUATION COMPLEXITY: High  Rollins: PT FREQUENCY: 2x/week  PT DURATION: 12 weeks  PLANNED INTERVENTIONS: Therapeutic exercises, Therapeutic activity, Neuromuscular re-education, Balance training, Gait training, Patient/Family education, Self Care, Joint mobilization, Joint manipulation, Stair training, Vestibular training, Canalith repositioning, Orthotic/Fit training, DME instructions, Wheelchair mobility training, Spinal mobilization, Cryotherapy, Moist heat, scar mobilization, Splintting, Taping, Manual therapy, and Re-evaluation  Rollins FOR NEXT SESSION:  stretching, strengthening, gait, balance, manual, standing balance 4:44 PM, 02/12/22   Zollie Pee, PT 02/12/2022, 4:44 PM

## 2022-02-16 ENCOUNTER — Ambulatory Visit: Payer: Medicare Other

## 2022-02-16 NOTE — Therapy (Signed)
OUTPATIENT OCCUPATIONAL NEURO TREATMENT NOTE Patient Name: Kelly Rollins MRN: [id number] DOB:01/22/1971, 52 y.o., female Today's Date: 02/16/2022  PCP: [name], PA-C REFERRING PROVIDER: [name], Corrine Jeani Hawking, PA-C   OT End of Session - 02/16/22 0901     Visit Number 28    Number of Visits 9    Date for OT Re-Evaluation 04/28/22    Authorization Time Period Reporting period beginning 11/18/21/-02/05/22    OT Start Time 1445    OT Stop Time 1530    OT Time Calculation (min) 45 min    Activity Tolerance Patient tolerated treatment well    Behavior During Therapy [facility] for tasks assessed/performed            Past Medical History:  Diagnosis Date   ABDOMINAL PAIN OTHER SPECIFIED SITE 02/21/2009   Qualifier: Diagnosis of  By: Deborra Medina MD, Talia     ABDOMINAL PAIN-LUQ 04/17/2009   Qualifier: Diagnosis of  By: Fuller Plan MD Lamont Snowball T    Anal fissure    Anginal pain ([place])    Asthma    Depression    Diabetes mellitus without complication ([place])    Dyspnea    Dysrhythmia    Headache(784.0)    HEMATOCHEZIA 04/17/2009   Qualifier: Diagnosis of  By: Fuller Plan MD Lamont Snowball T    Hyperlipidemia    Hypertension    IBS (irritable bowel syndrome)    Migraine headache 11/06/2008   Qualifier: Diagnosis of  By: Deborra Medina MD, Talia     OVARIAN CYST, RIGHT 02/21/2009   Qualifier: Diagnosis of  By: Deborra Medina MD, Talia     PALPITATIONS, OCCASIONAL 04/17/2010   Qualifier: Diagnosis of  By: Deborra Medina MD, Talia     Pancreatitis 10/14/2017   Past Surgical History:  Procedure Laterality Date   BREAST EXCISIONAL BIOPSY Left 1997   ? left side, done at time of reduction , benign   BREAST REDUCTION SURGERY     bilateral   CESAREAN SECTION     CHOLECYSTECTOMY     COLONOSCOPY     ESOPHAGOGASTRODUODENOSCOPY     ESOPHAGOGASTRODUODENOSCOPY (EGD) WITH PROPOFOL N/A 10/21/2020   Procedure: ESOPHAGOGASTRODUODENOSCOPY (EGD) WITH PROPOFOL;  Surgeon: Lesly Rubenstein, MD;  Location: [facility] ENDOSCOPY;   Service: Endoscopy;  Laterality: N/A;   REDUCTION MAMMAPLASTY Bilateral 1997   VAGINAL HYSTERECTOMY     Patient Active Problem List   Diagnosis Date Noted   Abnormal MRI, lumbar spine (01/29/2020) 02/19/2020   Anxiety due to MRI 01/25/2020   Claustrophobia associated to MRI 01/25/2020   Enthesopathy of knee region (Right) 01/25/2020   Enthesopathy of knee region (Left) 01/25/2020   Lumbar facet hypertrophy 01/25/2020   Lumbosacral radiculopathy at S1 (Left) 01/17/2020   Lumbosacral radiculopathy (S1) (Left) 01/17/2020   Chronic lower extremity pain (Bilateral) (L>R) 01/17/2020   History of pancreatitis 07/18/2019   Obstructive sleep apnea syndrome 01/02/2019   Lumbar facet arthropathy (Multilevel) (Bilateral) 12/14/2018   Osteoarthritis involving multiple joints 11/28/2018   Chronic hip pain (Left) 11/16/2018   Acute hip pain (Left) 11/16/2018   DDD (degenerative disc disease), lumbosacral 11/16/2018   Chronic hip pain (Secondary source of pain) (Bilateral) (L>R) 05/19/2017   Elevated C-reactive protein (CRP) 05/19/2017   Elevated sed rate 05/19/2017   Spondylosis without myelopathy or radiculopathy, lumbosacral region 05/19/2017   Pharmacologic therapy 05/06/2017   Disorder of skeletal system 05/06/2017   Problems influencing health status 05/06/2017   Chest pain with moderate risk of acute coronary syndrome 04/21/2017   DOE (dyspnea on  exertion) 04/21/2017   Intermittent palpitations 04/21/2017   Vitamin D insufficiency 05/04/2016   Depression 02/04/2016   Fatty liver 02/04/2016   Hypertension associated with diabetes ([place]) 02/04/2016   Chronic pain syndrome 01/15/2016   Acute low back pain 05/28/2015   Lumbar transverse process fracture (HCC) (Left L1) (03/11/15) 05/28/2015   Long term current use of opiate analgesic 05/01/2015   Long term prescription opiate use 05/01/2015   Opiate use (25 MME/Day) 05/01/2015   Encounter for therapeutic drug level monitoring 05/01/2015    Encounter for pain management planning 05/01/2015   Chronic low back pain (Primary Source of Pain) (Bilateral) (L>R) 05/01/2015   Lumbar facet syndrome (Bilateral) (L>R) 05/01/2015   Chronic knee pain [facility] source of pain) (Bilateral) (L>R) 05/01/2015   Lumbar spondylosis 05/01/2015   Neuropathic pain 05/01/2015   Neurogenic pain 05/01/2015   Myofascial pain 05/01/2015   Essential hypertriglyceridemia 04/26/2014   Type 2 diabetes mellitus with hyperglycemia, with long-term current use of insulin ([place]) 04/26/2014   Anxiety 05/15/2010   Obesity 05/15/2010   Hyperlipidemia 04/03/2010   Fatty liver disease 04/02/2010   Pure hypercholesterolemia 03/19/2010   GERD 04/17/2009   Constipation 04/17/2009   TOBACCO ABUSE 11/06/2008    ONSET DATE: 02/06/2021  REFERRING DIAG: Astrocytoma  THERAPY DIAG:  Muscle weakness (generalized)  Other lack of coordination  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HCC)  Rationale for Evaluation and Treatment Rehabilitation  SUBJECTIVE:     SUBJECTIVE STATEMENT: Pt and spouse report they are thinking they may reduce therapy visits to 1x per week d/t starting over with their deductible in the new year and wanting to save money on their copay.  OT encouraged pt speak with front desk to coordinate which visits they'd like to keep each week.   Pt accompanied by: significant other  PERTINENT HISTORY:  Pt. Is a 52 y.o. 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 PMHx includes: Anorexia, dehydration, Adult Failure to thrive, contracture of muscles multiple sites. Pt./caregiver reports recently being hospitalized with a UTI, and dehydration.   PRECAUTIONS: Fall  WEIGHT BEARING RESTRICTIONS No  PAIN:  Are you having pain?  Pt. reports 6/10 pain in the entire R side.   FALLS: Has patient fallen in last 6 months? Yes. Number of falls Multiple Falls  LIVING ENVIRONMENT: Lives  with: Family Lives in: House/apartment Stairs: Yes: External: 4 steps; bilateral but cannot reach both Has following equipment at home: Single point cane, Walker - 4 wheeled, Hemi walker, shower chair, bed side commode, and Grab bars Lift chair in the tub, [place],  PLOF: Independent  PATIENT GOALS: To improve the right UE  OBJECTIVE:   HAND DOMINANCE: Right  ADLs: Transfers/ambulation related to ADLs: Eating: MaxA with cutting food, setting up, Modified I with nondominant Left hand, [place]  Grooming: Independent  UB Dressing: MaxA  LB Dressing: MaxA Toileting: Uses a BSCommode mInA transfers, MaxA clothing negotiation Bathing: MaxA Tub Shower transfers:  Uses a shower lift chair Equipment:  w/c, BSCommode, cane   IADLs: Shopping: Husband performs Light housekeeping: Max Meal Prep: MaxA Community mobility: Relies on family Medication management:Husband assists Doctor, hospital: Performs together Handwriting: Not legible  MOBILITY STATUS: Hx of falls   ACTIVITY TOLERANCE: Activity tolerance: limited  FUNCTIONAL OUTCOME MEASURES: FOTO: 11.76  UPPER EXTREMITY ROM  : WFL Left AROM/PROM   Passive ROM Right eval Right  08/18/21 Right 09/08/2021 Right 10/30/2021 Right 02/03/22  Shoulder flexion 30  48 15 55  Shoulder abduction 40 62 53 45 55  Shoulder adduction          Shoulder extension          Shoulder internal rotation          Shoulder external rotation          Elbow flexion 120   110 122 140  Elbow extension -67   -46 -72 -30  Wrist flexion Neutral   Neutral neutral 60  Wrist extension 12   40 25 28  Wrist ulnar deviation          Wrist radial deviation          Wrist pronation          Wrist supination          (Blank rows = not tested)     UPPER EXTREMITY MMT:      MMT Right eval Right 02/03/22 Left eval  Shoulder flexion 0/5 0/5 4+/5  Shoulder abduction 0/5 0/5 4+/5  Shoulder adduction       Shoulder extension       Shoulder internal  rotation       Shoulder external rotation       Middle trapezius       Lower trapezius       Elbow flexion 0/5 0/5 4+/5  Elbow extension 0/5 0/5 4+/5  Wrist flexion       Wrist extension 0/5 0/5 4+/5  Wrist ulnar deviation       Wrist radial deviation       Wrist pronation       Wrist supination       (Blank rows = not tested)   FOTO score: 09/08/2021: 11   FOTO score 02/03/22: 11.76  SENSATION: Light touch: WFL  EDEMA:   Wrist: 18.5 cm MCP: 21.5 cm  10/30/2021:  Wrist: 17.5 cm MCPs: 20cm  02/03/22:  Wrist: 17.3 cm MCP: 20.0 cm   MUSCLE TONE: RUE: Severe  COGNITION: Overall cognitive status: Impaired  VISION: Subjective report: Wears glasses Baseline vision: Wears glasses all the time Pt. Reports no changes   PRAXIS: Impaired: Motor planning  TODAY'S TREATMENT:  Moist heat applied to R shoulder and hand for pain management and muscle relaxation, intermittently throughout session during rest breaks from assessment activities.   Therapeutic Exercise:   Pt and spouse requested review of PROM throughout the RUE as they plan to reduce visits to 1x per week and will plan to try more therapy at home.  OT provided instruction in PROM throughout the [place] for shoulder flex/abd/ER/horiz abd/add, elbow flex/ext, forearm pron/sup, wrist and digit flex/ext.  OT provided handouts and reviewed with pt and spouse.  At one point pt's R hand started cramping in flexion.  OT demonstrated passive wrist flexion to relax digits which provided relief.  Spouse able to return demo of all exercises with min-mod vc for technique.    PATIENT EDUCATION: Education details: PROM throughout the RUE Person educated: Patient and Spouse Education method: explanation, vc Education comprehension: verbalized/demonstrated understanding, additional educ needed   HOME EXERCISE PROGRAM:   Pt./caregiver education about positioning of the RUE, ROM  GOALS: Goals reviewed with patient? Yes  SHORT TERM  GOALS: Target date: 03/17/22 Pt./caregivers will demonstrate independence with HEPs for UE ROM, and edema control Baseline: 02/03/22: Caregiver assist for RUE positioning to reduce edema.  Improved from initial eval.  Additional training needed for caregiver PROM throughout the RUE.  10/30/2021: Pt. Edema  has improved. Pt. Requires caregiver assist for positioning. 09/08/2021: Pt. Requires assist for positioning, ROM, edema control. Eval: No current HEPs  Goal status: Ongoing  LONG TERM GOALS: Target date: 04/28/22 Pt. Will improve right hand edema by 1 cm at the wrist, and MCPs Baseline: 02/03/22: wrist 17.3 cm, MCPs: 20 cm; 10/30/2021:  wrist: 17.5 cm, MCPs: 20 cm 09/08/2021: Wrist: 17.5 cm, MCPs: 20.5 cm Eval: Wrist: 18.5cm, MCPs: 21.5cm Goal status: Ongoing  2.  Pt. Will improve right shoulder PROM to 90 degrees for improved tolerance to UB ADLs. Baseline: 02/03/22: shoulder flexion 55, abd 55; 10/30/2021: shoulder flexion: 15, abduction: 45 09/08/2021: Right shoulder flexion: 48, abduction: 53 Eval: PROM shoulder flexion: 30 degrees, Abduction: 40 degrees Goal status: Ongoing/ revised  3.  Pt. Will improve right elbow PROM by 10 degrees to prepare for hand to mouth patterns Baseline: 02/03/22: elbow flex 140, ext -30; 10/30/2021: elbow extension -72, flexion: 122 09/08/2021: elbow extension: -46, flexion: 110 Eval:   elbow extension -67, flexion 120 Goal status: achieved  4.  Pt. Will improve PROM right wrist extension to 45 degrees or better to enable WB through the hand during transfers and ADLs. Baseline: 02/03/22: wrist ext 28; 10/30/2021: wrist extension 25 09/08/2021: Wrist extension: 40 Eval: wrist extension 12 degrees, flexion to neutral Goal status: Ongoing/ revised  5.  Pt. Will tolerate full digit extension in the right hand to promote good skin integrity through the palmar surface of the right hand  Baseline: 02/03/22: Pt tolerates ROM through ~75% of passive digit extension range; 10/30/2021: Pt.  Tolerates ROM through 50% of  digit extension range, occasionally 75% 09/08/2021: Pt. Is able to tolerate PROM through 50% of digit extension in the digits. Eval: Pt. Maintains digits in tight flexion Goal status: Ongoing  6.  Pt. Will require minA UE dressing using one armed dressing techniques. Baseline: 02/03/22: max A pull over shirt; 10/30/2021: Independent with button down shirt/sweater/cardigan, MaxA pullover shirt.09/08/2021: MaxA Eval: MaxA Goal status: Ongoing   7. Pt will tolerate 15 min or more of supine lying for caregiver to perform PROM throughout the RUE and for benefits of pressure relief and trunk extension.    Baseline: 02/03/22: pt has not yet attempted at home but was able to tolerate 10 min in supine on mat in clinic.  Goal status: New  ASSESSMENT:  CLINICAL IMPRESSION: Pt and spouse report they are thinking they may reduce therapy visits to 1x per week d/t starting over with their deductible in the new year and wanting to save money on their copay.  OT encouraged pt speak with front desk to coordinate which visits they'd like to keep each week.  Pt and spouse requested review of PROM throughout the RUE as they plan to reduce visits to 1x per week and will plan to try more therapy at home.  OT provided instruction in PROM throughout the [place] for shoulder flex/abd/ER/horiz abd/add, elbow flex/ext, forearm pron/sup, wrist and digit flex/ext.  OT provided handouts and reviewed with pt and spouse.  At one point pt's R hand started cramping in flexion.  OT demonstrated passive wrist flexion to relax digits which provided relief.  Spouse able to return demo of all exercises with min-mod vc for technique.  OT encouraged daily completion of PROM.  Pt/spouse verbalized understanding of all education provided.  Additional training needed with HEP.  Will continue to benefit from OT 1-2x per week to work towards improving pain, ROM, and to increase engagement of the RUE during  ADLs, and IADLs.    PERFORMANCE DEFICITS in functional skills including ADLs, IADLs, coordination, proprioception, sensation, edema, tone, ROM, strength, pain, FMC, GMC, balance, decreased knowledge of use of DME, skin integrity, and UE functional use, cognitive skills including attention, memory, and safety awareness, and psychosocial skills including coping strategies.   IMPAIRMENTS are limiting patient from ADLs, IADLs, education, work, leisure, and social participation.   COMORBIDITIES may have co-morbidities  that affects occupational performance. Patient will benefit from skilled OT to address above impairments and improve overall function.  MODIFICATION OR ASSISTANCE TO COMPLETE EVALUATION: Maximum modification of tasks or assist with assess necessary to complete an evaluation.  OT OCCUPATIONAL PROFILE AND HISTORY: Detailed assessment: Review of records and additional review of physical, cognitive, psychosocial history related to current functional performance.  CLINICAL DECISION MAKING: High - multiple treatment options, significant modification of task necessary  REHAB POTENTIAL: Good for stated goals  EVALUATION COMPLEXITY: High    PLAN: OT FREQUENCY: 1-2x/week  OT DURATION: 12 weeks  PLANNED INTERVENTIONS: self care/ADL training, therapeutic exercise, therapeutic activity, neuromuscular re-education, manual therapy, passive range of motion, paraffin, moist heat, contrast bath, patient/family education, cognitive remediation/compensation, energy conservation, and coping strategies training  RECOMMENDED OTHER SERVICES: PT, and ST  CONSULTED AND AGREED WITH PLAN OF CARE: Patient and family member/caregiver  PLAN FOR NEXT SESSION: Initiate PROM, and RUE Edema control techniques   Leta Speller, MS, OTR/L  Darleene Cleaver, OT 02/16/2022, 9:03 AM

## 2022-02-17 ENCOUNTER — Encounter: Payer: Medicare Other | Admitting: Speech Pathology

## 2022-02-17 ENCOUNTER — Ambulatory Visit: Payer: Medicare Other

## 2022-02-17 DIAGNOSIS — R262 Difficulty in walking, not elsewhere classified: Secondary | ICD-10-CM

## 2022-02-17 DIAGNOSIS — R278 Other lack of coordination: Secondary | ICD-10-CM

## 2022-02-17 DIAGNOSIS — R2681 Unsteadiness on feet: Secondary | ICD-10-CM

## 2022-02-17 DIAGNOSIS — C719 Malignant neoplasm of brain, unspecified: Secondary | ICD-10-CM

## 2022-02-17 DIAGNOSIS — M6281 Muscle weakness (generalized): Secondary | ICD-10-CM

## 2022-02-17 NOTE — Therapy (Signed)
OUTPATIENT PHYSICAL THERAPY NEURO TREATMENT    Patient Name: Kelly Rollins MRN: [id number] DOB:1971/01/18, 52 y.o., female Today's Date: 02/17/2022   PCP: [name], PA-C REFERRING PROVIDER: [name], Bonnell Public, PA-C   PT End of Session - 02/17/22 1602     Visit Number 19    Number of Visits 32    Date for PT Re-Evaluation 03/12/22    Authorization Type [employer] Medicare    Authorization Time Period 09/16/21-12/09/21    PT Start Time 1602    PT Stop Time 1645    PT Time Calculation (min) 43 min    Equipment Utilized During Treatment Gait belt    Activity Tolerance Patient tolerated treatment well;Patient limited by fatigue    Behavior During Therapy [facility] for tasks assessed/performed                        Past Medical History:  Diagnosis Date   ABDOMINAL PAIN OTHER SPECIFIED SITE 02/21/2009   Qualifier: Diagnosis of  By: Deborra Medina MD, Talia     ABDOMINAL PAIN-LUQ 04/17/2009   Qualifier: Diagnosis of  By: Fuller Plan MD Lamont Snowball T    Anal fissure    Anginal pain ([place])    Asthma    Depression    Diabetes mellitus without complication ([place])    Dyspnea    Dysrhythmia    Headache(784.0)    HEMATOCHEZIA 04/17/2009   Qualifier: Diagnosis of  By: Fuller Plan MD Lamont Snowball T    Hyperlipidemia    Hypertension    IBS (irritable bowel syndrome)    Migraine headache 11/06/2008   Qualifier: Diagnosis of  By: Deborra Medina MD, Talia     OVARIAN CYST, RIGHT 02/21/2009   Qualifier: Diagnosis of  By: Deborra Medina MD, Talia     PALPITATIONS, OCCASIONAL 04/17/2010   Qualifier: Diagnosis of  By: Deborra Medina MD, Talia     Pancreatitis 10/14/2017   Past Surgical History:  Procedure Laterality Date   BREAST EXCISIONAL BIOPSY Left 1997   ? left side, done at time of reduction , benign   BREAST REDUCTION SURGERY     bilateral   CESAREAN SECTION     CHOLECYSTECTOMY     COLONOSCOPY     ESOPHAGOGASTRODUODENOSCOPY     ESOPHAGOGASTRODUODENOSCOPY (EGD) WITH PROPOFOL N/A 10/21/2020    Procedure: ESOPHAGOGASTRODUODENOSCOPY (EGD) WITH PROPOFOL;  Surgeon: Lesly Rubenstein, MD;  Location: [facility] ENDOSCOPY;  Service: Endoscopy;  Laterality: N/A;   REDUCTION MAMMAPLASTY Bilateral 1997   VAGINAL HYSTERECTOMY     Patient Active Problem List   Diagnosis Date Noted   Abnormal MRI, lumbar spine (01/29/2020) 02/19/2020   Anxiety due to MRI 01/25/2020   Claustrophobia associated to MRI 01/25/2020   Enthesopathy of knee region (Right) 01/25/2020   Enthesopathy of knee region (Left) 01/25/2020   Lumbar facet hypertrophy 01/25/2020   Lumbosacral radiculopathy at S1 (Left) 01/17/2020   Lumbosacral radiculopathy (S1) (Left) 01/17/2020   Chronic lower extremity pain (Bilateral) (L>R) 01/17/2020   History of pancreatitis 07/18/2019   Obstructive sleep apnea syndrome 01/02/2019   Lumbar facet arthropathy (Multilevel) (Bilateral) 12/14/2018   Osteoarthritis involving multiple joints 11/28/2018   Chronic hip pain (Left) 11/16/2018   Acute hip pain (Left) 11/16/2018   DDD (degenerative disc disease), lumbosacral 11/16/2018   Chronic hip pain (Secondary source of pain) (Bilateral) (L>R) 05/19/2017   Elevated C-reactive protein (CRP) 05/19/2017   Elevated sed rate 05/19/2017   Spondylosis without myelopathy or radiculopathy, lumbosacral region 05/19/2017   Pharmacologic therapy  05/06/2017   Disorder of skeletal system 05/06/2017   Problems influencing health status 05/06/2017   Chest pain with moderate risk of acute coronary syndrome 04/21/2017   DOE (dyspnea on exertion) 04/21/2017   Intermittent palpitations 04/21/2017   Vitamin D insufficiency 05/04/2016   Depression 02/04/2016   Fatty liver 02/04/2016   Hypertension associated with diabetes ([place]) 02/04/2016   Chronic pain syndrome 01/15/2016   Acute low back pain 05/28/2015   Lumbar transverse process fracture (HCC) (Left L1) (03/11/15) 05/28/2015   Long term current use of opiate analgesic 05/01/2015   Long term prescription  opiate use 05/01/2015   Opiate use (25 MME/Day) 05/01/2015   Encounter for therapeutic drug level monitoring 05/01/2015   Encounter for pain management planning 05/01/2015   Chronic low back pain (Primary Source of Pain) (Bilateral) (L>R) 05/01/2015   Lumbar facet syndrome (Bilateral) (L>R) 05/01/2015   Chronic knee pain [facility] source of pain) (Bilateral) (L>R) 05/01/2015   Lumbar spondylosis 05/01/2015   Neuropathic pain 05/01/2015   Neurogenic pain 05/01/2015   Myofascial pain 05/01/2015   Essential hypertriglyceridemia 04/26/2014   Type 2 diabetes mellitus with hyperglycemia, with long-term current use of insulin ([place]) 04/26/2014   Anxiety 05/15/2010   Obesity 05/15/2010   Hyperlipidemia 04/03/2010   Fatty liver disease 04/02/2010   Pure hypercholesterolemia 03/19/2010   GERD 04/17/2009   Constipation 04/17/2009   TOBACCO ABUSE 11/06/2008    ONSET DATE: January 2023  REFERRING DIAG: C71.9 (ICD-10-CM) - Malignant neoplasm of brain, unspecified  THERAPY DIAG:  Difficulty in walking, not elsewhere classified  Other lack of coordination  Muscle weakness (generalized)  Unsteadiness on feet  Rationale for Evaluation and Treatment Rehabilitation  SUBJECTIVE:                                                                                                                                                                                              SUBJECTIVE STATEMENT: Pt reports she is feeling wonderful currently. Pt reports nothing beyond her usual aches/pains. Pt went to the beach. Pt reports no stumbles/falls. Pt spouse reports pt was able to take a shower standing up while holding onto grab bars. Pt also slept in her bed with pillow supported in a reclined position.  Pt accompanied by:  [name]  PAIN:  Are you having pain? 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 Pt's husband, [name], present with pt. [name] provides majority of hx as pt has difficulty with  speech due to Broca's aphasia (working with SLP). Pt reports partial paralysis of R side. She is unable to move her RUE, RLE also affected. Pt ambulates with hemiwalker in their home for short distances and does not use WC at home. She fatigues quickly with ambulation. Pt has AFO for RLE but unable to determine how to donn it without it hurting her LE. Pt currently with wound on R calf covered by large bandage that they have been treating for at least a month (reports physician aware). In addition to wound on R calf, pt reports R side skin irritation under breast and lateral trunk due to bra underwire. They have since removed underwire. Pt was hospitalized 10 days in June due to UTI. She has had at least 12 falls in the last six months. Reports no injuries with falls, and states "just bumps and bruises." Pt reports she has constant head pain. Pt spouse reports pt has pain throughout her whole right side down to LE. Can have bad cramps in RUE and RLE. Reports no numbness/tingling. Occ dizziness, unable to describe further. Pt sleeps in recliner, wakes up stiff in the morning. PMH per chart includes: Anorexia, dehydration, Adult Failure to thrive, contracture of muscles multiple sites. Pt./caregiver reports recently being hospitalized with a UTI, and dehydration. Extensive hx, please refer to chart for full details.   PAIN: from eval Are you having pain? Yes: NPRS scale: 3/10 Pain location: Top of head, front Pain description: States, "cancer pain" head pain Aggravating factors: unsure Relieving factors: medications  PRECAUTIONS: Fall  WEIGHT BEARING RESTRICTIONS No  FALLS: Has patient fallen in last 6 months? Yes. Number of falls 12  LIVING ENVIRONMENT: information primarily from chart Lives with: lives with their spouse Lives in:  House/apartment Stairs: Yes: External: 4 steps; bilateral but cannot reach both Has following equipment at home: Single point cane, Walker - 4 wheeled, Hemi walker, shower chair, bed side commode, Grab bars, and lift chair  PLOF: Independent  PATIENT GOALS  "Normal function" as much as possible. Improving her balance.    TODAY'S TREATMENT:   TherEx: Pt spouse asks PT if pt's formerly healing RLE wound site can be inspected since he is not sure it is still healing. Wound appears with old, dry scab. However, PT does observe slightly reddened area of skin around scab. Pt's spouse not sure how long it has looked like that. PT recommends they follow-up with pt's wound care specialist and physician regarding reddening skin. Instructs pt and her spouse to have pt seek immediate care should pt start experiencing other symptoms such as fever, or feeling worse.   Gait with hemiwalker for endurance 140 ft, close CGA to prevent LOB with WC follow  LAQ/soccer ball kick for external target 1x10 each LE  --Pt then performed multiple reps with 1/4 lb AW on RLE (placed more on dorsum of foot/around ankle of RLE as to be below wound site so it did not come into contact with wound site for intervention), and 2# LLE. Cuing throughout for large amplitude movement.  Seated march 2x10 LLE and multiple reps RLE with external TC and PT assisting pt through concentric phase of movement for majority of reps, focus on slow eccentric phase      PATIENT EDUCATION: Education details: Pt educated throughout session about proper posture and technique with exercises. Improved exercise technique, movement at target joints, use of target muscles after min to mod verbal, visual, tactile cues.  Instructs pt and her spouse to contact  pt's physician and wound care specialist about her RLE wound site.  Person educated: pt and her spouse Education method:Explanation, Demonstration, Tactile cues, and Verbal cues, Education  comprehension: verbalized understanding, returned demonstration, tactile cues required, and needs further education   HOME EXERCISE PROGRAM:  Pt to perform only as many reps of STS per set as able  Access Code: [email or web address] URL: [email or web address] Date: 11/18/2021 Prepared by: Ricard Dillon  Exercises - Elbow Extension with Anchored Resistance  - 1 x daily - 5 x weekly - 3 sets - 10 reps  Access Code: [email or web address] URL: [email or web address] Date: 10/02/2021 Prepared by: Sande Brothers  Exercises - Seated Hip Flexion March with Ankle Weights  - 1 x daily - 7 x weekly - 3 sets - 10 reps - Seated Long Arc Quad  - 1 x daily - 7 x weekly - 3 sets - 10 reps - Supine Quad Set  - 1 x daily - 7 x weekly - 3 sets - 10 reps - Proper Sit to Stand Technique  - 1 x daily - 7 x weekly - 3 sets - 10 reps  GOALS: Goals reviewed with patient? Yes  SHORT TERM GOALS: Target date: 01/29/2022   Patient will be independent in home exercise program to improve strength/mobility for better functional independence with ADLs. Baseline: to be initiated next 1-2 sessions; 10/17 previously issued, pt is 11/16: to be advanced as pt has gained strength since previous assessment  Goal status: ONGOING   LONG TERM GOALS: Target date: 03/12/2022   Patient will demo ability to perform at least 1 STS by pushing from [facility] with LUE instead of pulling to stand to improve functional mobility Baseline: Goal changed as pt FOTO tracked by OT, with new goal: pt currently pulls to stand; 11/16: deferred due to pt feeling unwell; 11/22: able to complete with mod assist Goal status: ONGOING   2.   Patient (< 58 years old) will be able to perform and complete five times sit to stand test from standard chair height in <30 seconds to indicate increased LE strength and improved balance. Baseline: Pt able to complete only 1 stand, insufficient strength currently to complete full test; 10/17 31 seconds pull  to stand with UE, close CGA-min a 11/16: deferred due to pt feeling unwell Goal status:  PARTIALLY MET  3.  Pt will improve LLE strength by at least 1/2 point on MMT in order to increase ease with transfers and functional mobility. Baseline: LLE grossly 4/5 throughout; 10/17: still generally 4/5 B, however now exhibits AROM with R hip flexion and knee ext; 11/16: 4+/5 LLE, RLE 3-/5, noted improvement however in quads and hip flexors  Goal status: MET  4.  Patient will increase 10 meter walk test to at least 0.80 m/s as to improve gait speed for better community ambulation and to reduce fall risk. Baseline: to be tested next 1-2 visits; 10/17: pt unable to perform 11/16: deferred due to pt feeling unwell; 11/21: 0.17 m/s Goal status: IN PROGRESS  5.  Pt will complete STS from standard height chair with UUE assist off armrests and with no greater than min assist to improve ease and safety with transfers. Baseline: mod a x2; 10/17: pulling to stand with LUE CGA 11/16: deferred due to pt feeling unwell; 11/21: currently requires mod a to complete Goal status: IN PROGRESS,   6.  Pt will improve RLE strength by at least 1/2 point on MMT in order to increase ease with transfers and functional  mobility. Baseline: 11/16: 4+/5 LLE, RLE 3-/5, noted improvement however in quads and hip flexors on R Goal status: NEW   ASSESSMENT:  CLINICAL IMPRESSION: Pt with excellent motivation to participate in session. She shows improved activity tolerance by completing the full appointment time. Pt also able to ambulate 140 ft with WC follow and close CGA before fatiguing. She was able to advance LE LAQ/soccer ball kicks with level of resistance. PT recommends pt follow-up with her wound specialist and physician regarding RLE wound (see above). The pt would benefit from continued skilled PT to address immobility, LE muscle weakness, and pain in order to decrease fall risk and increase functional mobility and  QOL.   OBJECTIVE IMPAIRMENTS Abnormal gait, decreased activity tolerance, decreased balance, decreased coordination, decreased endurance, decreased mobility, difficulty walking, decreased ROM, decreased strength, hypomobility, increased edema, increased fascial restrictions, increased muscle spasms, impaired flexibility, impaired sensation, impaired tone, impaired UE functional use, improper body mechanics, postural dysfunction, and pain.   ACTIVITY LIMITATIONS carrying, lifting, bending, sitting, standing, squatting, stairs, transfers, bed mobility, bathing, toileting, dressing, reach over head, hygiene/grooming, locomotion level, and caring for others  PARTICIPATION LIMITATIONS: meal prep, cleaning, laundry, medication management, personal finances, driving, shopping, community activity, occupation, and yard work  PERSONAL Education officer, community, Past/current experiences, Sex, and 3+ comorbidities: Anorexia, dehydration, Adult Failure to thrive, contracture of muscles multiple sites, multiple falls, UTI, extensive PMH refer to chart  are also affecting patient's functional outcome.   REHAB POTENTIAL: Fair    CLINICAL DECISION MAKING: Evolving/moderate complexity  EVALUATION COMPLEXITY: High  PLAN: PT FREQUENCY: 2x/week  PT DURATION: 12 weeks  PLANNED INTERVENTIONS: Therapeutic exercises, Therapeutic activity, Neuromuscular re-education, Balance training, Gait training, Patient/Family education, Self Care, Joint mobilization, Joint manipulation, Stair training, Vestibular training, Canalith repositioning, Orthotic/Fit training, DME instructions, Wheelchair mobility training, Spinal mobilization, Cryotherapy, Moist heat, scar mobilization, Splintting, Taping, Manual therapy, and Re-evaluation  PLAN FOR NEXT SESSION:  stretching, strengthening, gait, balance, manual, standing balance 4:55 PM, 02/17/22   Zollie Pee, PT 02/17/2022, 4:55 PM

## 2022-02-18 NOTE — Therapy (Signed)
OUTPATIENT OCCUPATIONAL NEURO TREATMENT NOTE Patient Name: Kelly Rollins MRN: [id number] DOB:1970/10/05, 52 y.o., female Today's Date: 02/18/2022  PCP: [name], PA-C REFERRING PROVIDER: [name], Corrine Jeani Hawking, PA-C   OT End of Session - 02/18/22 1419     Visit Number 29    Number of Visits 70    Date for OT Re-Evaluation 04/28/22    Authorization Time Period Reporting period beginning 02/05/22    OT Start Time 1515    OT Stop Time 1600    OT Time Calculation (min) 45 min    Activity Tolerance Patient tolerated treatment well    Behavior During Therapy [facility] for tasks assessed/performed            Past Medical History:  Diagnosis Date   ABDOMINAL PAIN OTHER SPECIFIED SITE 02/21/2009   Qualifier: Diagnosis of  By: Deborra Medina MD, Talia     ABDOMINAL PAIN-LUQ 04/17/2009   Qualifier: Diagnosis of  By: Fuller Plan MD Lamont Snowball T    Anal fissure    Anginal pain ([place])    Asthma    Depression    Diabetes mellitus without complication ([place])    Dyspnea    Dysrhythmia    Headache(784.0)    HEMATOCHEZIA 04/17/2009   Qualifier: Diagnosis of  By: Fuller Plan MD Lamont Snowball T    Hyperlipidemia    Hypertension    IBS (irritable bowel syndrome)    Migraine headache 11/06/2008   Qualifier: Diagnosis of  By: Deborra Medina MD, Talia     OVARIAN CYST, RIGHT 02/21/2009   Qualifier: Diagnosis of  By: Deborra Medina MD, Talia     PALPITATIONS, OCCASIONAL 04/17/2010   Qualifier: Diagnosis of  By: Deborra Medina MD, Talia     Pancreatitis 10/14/2017   Past Surgical History:  Procedure Laterality Date   BREAST EXCISIONAL BIOPSY Left 1997   ? left side, done at time of reduction , benign   BREAST REDUCTION SURGERY     bilateral   CESAREAN SECTION     CHOLECYSTECTOMY     COLONOSCOPY     ESOPHAGOGASTRODUODENOSCOPY     ESOPHAGOGASTRODUODENOSCOPY (EGD) WITH PROPOFOL N/A 10/21/2020   Procedure: ESOPHAGOGASTRODUODENOSCOPY (EGD) WITH PROPOFOL;  Surgeon: Lesly Rubenstein, MD;  Location: [facility] ENDOSCOPY;  Service:  Endoscopy;  Laterality: N/A;   REDUCTION MAMMAPLASTY Bilateral 1997   VAGINAL HYSTERECTOMY     Patient Active Problem List   Diagnosis Date Noted   Abnormal MRI, lumbar spine (01/29/2020) 02/19/2020   Anxiety due to MRI 01/25/2020   Claustrophobia associated to MRI 01/25/2020   Enthesopathy of knee region (Right) 01/25/2020   Enthesopathy of knee region (Left) 01/25/2020   Lumbar facet hypertrophy 01/25/2020   Lumbosacral radiculopathy at S1 (Left) 01/17/2020   Lumbosacral radiculopathy (S1) (Left) 01/17/2020   Chronic lower extremity pain (Bilateral) (L>R) 01/17/2020   History of pancreatitis 07/18/2019   Obstructive sleep apnea syndrome 01/02/2019   Lumbar facet arthropathy (Multilevel) (Bilateral) 12/14/2018   Osteoarthritis involving multiple joints 11/28/2018   Chronic hip pain (Left) 11/16/2018   Acute hip pain (Left) 11/16/2018   DDD (degenerative disc disease), lumbosacral 11/16/2018   Chronic hip pain (Secondary source of pain) (Bilateral) (L>R) 05/19/2017   Elevated C-reactive protein (CRP) 05/19/2017   Elevated sed rate 05/19/2017   Spondylosis without myelopathy or radiculopathy, lumbosacral region 05/19/2017   Pharmacologic therapy 05/06/2017   Disorder of skeletal system 05/06/2017   Problems influencing health status 05/06/2017   Chest pain with moderate risk of acute coronary syndrome 04/21/2017   DOE (dyspnea on  exertion) 04/21/2017   Intermittent palpitations 04/21/2017   Vitamin D insufficiency 05/04/2016   Depression 02/04/2016   Fatty liver 02/04/2016   Hypertension associated with diabetes ([place]) 02/04/2016   Chronic pain syndrome 01/15/2016   Acute low back pain 05/28/2015   Lumbar transverse process fracture (HCC) (Left L1) (03/11/15) 05/28/2015   Long term current use of opiate analgesic 05/01/2015   Long term prescription opiate use 05/01/2015   Opiate use (25 MME/Day) 05/01/2015   Encounter for therapeutic drug level monitoring 05/01/2015    Encounter for pain management planning 05/01/2015   Chronic low back pain (Primary Source of Pain) (Bilateral) (L>R) 05/01/2015   Lumbar facet syndrome (Bilateral) (L>R) 05/01/2015   Chronic knee pain [facility] source of pain) (Bilateral) (L>R) 05/01/2015   Lumbar spondylosis 05/01/2015   Neuropathic pain 05/01/2015   Neurogenic pain 05/01/2015   Myofascial pain 05/01/2015   Essential hypertriglyceridemia 04/26/2014   Type 2 diabetes mellitus with hyperglycemia, with long-term current use of insulin ([place]) 04/26/2014   Anxiety 05/15/2010   Obesity 05/15/2010   Hyperlipidemia 04/03/2010   Fatty liver disease 04/02/2010   Pure hypercholesterolemia 03/19/2010   GERD 04/17/2009   Constipation 04/17/2009   TOBACCO ABUSE 11/06/2008    ONSET DATE: 02/06/2021  REFERRING DIAG: Astrocytoma  THERAPY DIAG:  Muscle weakness (generalized)  Other lack of coordination  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HCC)  Rationale for Evaluation and Treatment Rehabilitation  SUBJECTIVE:     SUBJECTIVE STATEMENT: Pt reports she and her spouse made a trip to the beach for 3 nights this past weekend.  Pt reported that she was able to sleep on her back in the bed all 3 nights, though it took about 45 min the first night for spouse to comfortably position pt in the bed. Pt accompanied by: significant other  PERTINENT HISTORY:  Pt. Is a 52 y.o. 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 PMHx includes: Anorexia, dehydration, Adult Failure to thrive, contracture of muscles multiple sites. Pt./caregiver reports recently being hospitalized with a UTI, and dehydration.   PRECAUTIONS: Fall  WEIGHT BEARING RESTRICTIONS No  PAIN:  Are you having pain?  Pt. reports 6/10 pain in the entire R side.   FALLS: Has patient fallen in last 6 months? Yes. Number of falls Multiple Falls  LIVING ENVIRONMENT: Lives with: Family Lives in:  House/apartment Stairs: Yes: External: 4 steps; bilateral but cannot reach both Has following equipment at home: Single point cane, Walker - 4 wheeled, Hemi walker, shower chair, bed side commode, and Grab bars Lift chair in the tub, [place],  PLOF: Independent  PATIENT GOALS: To improve the right UE  OBJECTIVE:   HAND DOMINANCE: Right  ADLs: Transfers/ambulation related to ADLs: Eating: MaxA with cutting food, setting up, Modified I with nondominant Left hand, [employer]  Grooming: Independent  UB Dressing: MaxA  LB Dressing: MaxA Toileting: Uses a BSCommode mInA transfers, MaxA clothing negotiation Bathing: MaxA Tub Shower transfers:  Uses a shower lift chair Equipment:  w/c, BSCommode, cane   IADLs: Shopping: Husband performs Light housekeeping: Max Meal Prep: MaxA Community mobility: Relies on family Medication management:Husband assists Doctor, hospital: Performs together Handwriting: Not legible  MOBILITY STATUS: Hx of falls   ACTIVITY TOLERANCE: Activity tolerance: limited  FUNCTIONAL OUTCOME MEASURES: FOTO: 11.76  UPPER EXTREMITY ROM  : WFL Left AROM/PROM   Passive ROM Right eval Right  08/18/21 Right 09/08/2021 Right 10/30/2021 Right 02/03/22  Shoulder flexion 30  48 15 55  Shoulder abduction 40 62 53 45 55  Shoulder adduction          Shoulder extension          Shoulder internal rotation          Shoulder external rotation          Elbow flexion 120   110 122 140  Elbow extension -67   -46 -72 -30  Wrist flexion Neutral   Neutral neutral 60  Wrist extension 12   40 25 28  Wrist ulnar deviation          Wrist radial deviation          Wrist pronation          Wrist supination          (Blank rows = not tested)     UPPER EXTREMITY MMT:      MMT Right eval Right 02/03/22 Left eval  Shoulder flexion 0/5 0/5 4+/5  Shoulder abduction 0/5 0/5 4+/5  Shoulder adduction       Shoulder extension       Shoulder internal rotation       Shoulder  external rotation       Middle trapezius       Lower trapezius       Elbow flexion 0/5 0/5 4+/5  Elbow extension 0/5 0/5 4+/5  Wrist flexion       Wrist extension 0/5 0/5 4+/5  Wrist ulnar deviation       Wrist radial deviation       Wrist pronation       Wrist supination       (Blank rows = not tested)   FOTO score: 09/08/2021: 11   FOTO score 02/03/22: 11.76  SENSATION: Light touch: WFL  EDEMA:   Wrist: 18.5 cm MCP: 21.5 cm  10/30/2021:  Wrist: 17.5 cm MCPs: 20cm  02/03/22:  Wrist: 17.3 cm MCP: 20.0 cm   MUSCLE TONE: RUE: Severe  COGNITION: Overall cognitive status: Impaired  VISION: Subjective report: Wears glasses Baseline vision: Wears glasses all the time Pt. Reports no changes   PRAXIS: Impaired: Motor planning  TODAY'S TREATMENT:  Moist heat applied to R shoulder and hand for pain management and muscle relaxation, intermittently throughout session during rest breaks from assessment activities.   Therapeutic Exercise: Performed passive stretching throughout the RUE, working to reduce tone, increase muscle relaxation and maximize ROM to ease self care tasks.  Performed passive R shoulder depression, retraction/protraction, R shoulder flex, abd, and ER/IR with elbow at side, elbow flex/ext, forearm pron/sup, wrist flex/ext, and digit flex/ext/abd.  With slow, prolonged stretch, OT able to passively extend digits for pt to rest hand in about 90% of full digit ext on top of hot pack.     Self Care: Following passive stretching, OT was able to position each digit into sufficient extension on lap pillow for pt and spouse to clip R hand nails.  Pt made attempts at using L hand to clip R, but did not have sufficient pinch strength to manage this, so spouse clipped nails.  Pt was able to use L hand to file R hand nails using a built up nail file holder in the L hand while OT maintained digit extension in 1 digit at a time.  OT provided resources to obtain file holder as  desired as pt reported the holder was very helpful.  PATIENT EDUCATION: Education details: grooming AE Person educated: Patient and Spouse  Education method: explanation, vc Education comprehension: verbalized/demonstrated understanding, additional educ needed   HOME EXERCISE PROGRAM:   Pt./caregiver education about positioning of the RUE, ROM  GOALS: Goals reviewed with patient? Yes  SHORT TERM GOALS: Target date: 03/17/22 Pt./caregivers will demonstrate independence with HEPs for UE ROM, and edema control Baseline: 02/03/22: Caregiver assist for RUE positioning to reduce edema.  Improved from initial eval.  Additional training needed for caregiver PROM throughout the RUE.  10/30/2021: Pt. Edema has improved. Pt. Requires caregiver assist for positioning. 09/08/2021: Pt. Requires assist for positioning, ROM, edema control. Eval: No current HEPs  Goal status: Ongoing  LONG TERM GOALS: Target date: 04/28/22 Pt. Will improve right hand edema by 1 cm at the wrist, and MCPs Baseline: 02/03/22: wrist 17.3 cm, MCPs: 20 cm; 10/30/2021:  wrist: 17.5 cm, MCPs: 20 cm 09/08/2021: Wrist: 17.5 cm, MCPs: 20.5 cm Eval: Wrist: 18.5cm, MCPs: 21.5cm Goal status: Ongoing  2.  Pt. Will improve right shoulder PROM to 90 degrees for improved tolerance to UB ADLs. Baseline: 02/03/22: shoulder flexion 55, abd 55; 10/30/2021: shoulder flexion: 15, abduction: 45 09/08/2021: Right shoulder flexion: 48, abduction: 53 Eval: PROM shoulder flexion: 30 degrees, Abduction: 40 degrees Goal status: Ongoing/ revised  3.  Pt. Will improve right elbow PROM by 10 degrees to prepare for hand to mouth patterns Baseline: 02/03/22: elbow flex 140, ext -30; 10/30/2021: elbow extension -72, flexion: 122 09/08/2021: elbow extension: -46, flexion: 110 Eval:   elbow extension -67, flexion 120 Goal status: achieved  4.  Pt. Will improve PROM right wrist extension to 45 degrees or better to enable WB through the hand during transfers and  ADLs. Baseline: 02/03/22: wrist ext 28; 10/30/2021: wrist extension 25 09/08/2021: Wrist extension: 40 Eval: wrist extension 12 degrees, flexion to neutral Goal status: Ongoing/ revised  5.  Pt. Will tolerate full digit extension in the right hand to promote good skin integrity through the palmar surface of the right hand  Baseline: 02/03/22: Pt tolerates ROM through ~75% of passive digit extension range; 10/30/2021: Pt. Tolerates ROM through 50% of  digit extension range, occasionally 75% 09/08/2021: Pt. Is able to tolerate PROM through 50% of digit extension in the digits. Eval: Pt. Maintains digits in tight flexion Goal status: Ongoing  6.  Pt. Will require minA UE dressing using one armed dressing techniques. Baseline: 02/03/22: max A pull over shirt; 10/30/2021: Independent with button down shirt/sweater/cardigan, MaxA pullover shirt.09/08/2021: MaxA Eval: MaxA Goal status: Ongoing   7. Pt will tolerate 15 min or more of supine lying for caregiver to perform PROM throughout the RUE and for benefits of pressure relief and trunk extension.    Baseline: 02/03/22: pt has not yet attempted at home but was able to tolerate 10 min in supine on mat in clinic.  Goal status: New  ASSESSMENT:  CLINICAL IMPRESSION: Pt reports she and her spouse made a trip to the beach for 3 nights this past weekend.  Pt reported that she was able to sleep on her back in the bed all 3 nights, though it took about 45 min the first night for spouse to comfortably position pt in the bed.  Both report frequent attempts at stretching RUE over their weekend trip.  R hand was able to open 90% of full extension with passive stretching this date, which was an improvement compared to any attempts over the last month.  Following passive stretching, OT was able to position each digit into sufficient extension on lap pillow for pt and  spouse to clip R hand nails.  Pt made attempts at using L hand to clip R, but did not have sufficient pinch  strength to manage this, so spouse clipped nails.  Pt was able to use L hand to file R hand nails using a built up nail file holder in the L hand (mod A for thoroughness) while OT maintained digit extension in 1 digit at a time.  OT provided resources to obtain file holder as desired as pt reported the holder was very helpful.  Will continue to benefit from OT to work towards improving pain, ROM, and to increase engagement of the RUE during ADLs, and IADLs.   PERFORMANCE DEFICITS in functional skills including ADLs, IADLs, coordination, proprioception, sensation, edema, tone, ROM, strength, pain, FMC, GMC, balance, decreased knowledge of use of DME, skin integrity, and UE functional use, cognitive skills including attention, memory, and safety awareness, and psychosocial skills including coping strategies.   IMPAIRMENTS are limiting patient from ADLs, IADLs, education, work, leisure, and social participation.   COMORBIDITIES may have co-morbidities  that affects occupational performance. Patient will benefit from skilled OT to address above impairments and improve overall function.  MODIFICATION OR ASSISTANCE TO COMPLETE EVALUATION: Maximum modification of tasks or assist with assess necessary to complete an evaluation.  OT OCCUPATIONAL PROFILE AND HISTORY: Detailed assessment: Review of records and additional review of physical, cognitive, psychosocial history related to current functional performance.  CLINICAL DECISION MAKING: High - multiple treatment options, significant modification of task necessary  REHAB POTENTIAL: Good for stated goals  EVALUATION COMPLEXITY: High    PLAN: OT FREQUENCY: 1-2x/week  OT DURATION: 12 weeks  PLANNED INTERVENTIONS: self care/ADL training, therapeutic exercise, therapeutic activity, neuromuscular re-education, manual therapy, passive range of motion, paraffin, moist heat, contrast bath, patient/family education, cognitive remediation/compensation, energy  conservation, and coping strategies training  RECOMMENDED OTHER SERVICES: PT, and ST  CONSULTED AND AGREED WITH PLAN OF CARE: Patient and family member/caregiver  PLAN FOR NEXT SESSION: Initiate PROM, and RUE Edema control techniques   Leta Speller, MS, OTR/L  Darleene Cleaver, OT 02/18/2022, 2:20 PM

## 2022-02-19 ENCOUNTER — Ambulatory Visit: Payer: Medicare Other

## 2022-02-19 ENCOUNTER — Encounter: Payer: Medicare Other | Admitting: Speech Pathology

## 2022-02-24 ENCOUNTER — Encounter: Payer: Medicare Other | Admitting: Speech Pathology

## 2022-02-24 ENCOUNTER — Ambulatory Visit: Payer: Medicare Other

## 2022-02-24 ENCOUNTER — Ambulatory Visit: Payer: Medicare Other | Admitting: Occupational Therapy

## 2022-02-26 ENCOUNTER — Ambulatory Visit: Payer: Medicare Other

## 2022-02-26 ENCOUNTER — Encounter: Payer: Medicare Other | Admitting: Speech Pathology

## 2022-03-03 ENCOUNTER — Ambulatory Visit: Payer: Medicare Other

## 2022-03-03 ENCOUNTER — Encounter: Payer: Medicare Other | Admitting: Speech Pathology

## 2022-03-03 DIAGNOSIS — R2681 Unsteadiness on feet: Secondary | ICD-10-CM

## 2022-03-03 DIAGNOSIS — R278 Other lack of coordination: Secondary | ICD-10-CM

## 2022-03-03 DIAGNOSIS — M6281 Muscle weakness (generalized): Secondary | ICD-10-CM | POA: Diagnosis not present

## 2022-03-03 DIAGNOSIS — C719 Malignant neoplasm of brain, unspecified: Secondary | ICD-10-CM

## 2022-03-03 DIAGNOSIS — R262 Difficulty in walking, not elsewhere classified: Secondary | ICD-10-CM

## 2022-03-03 NOTE — Therapy (Incomplete)
OUTPATIENT OCCUPATIONAL NEURO TREATMENT NOTE Patient Name: Kelly Rollins MRN: [id number] DOB:11/10/1970, 52 y.o., female Today's Date: 02/18/2022  PCP: [name], PA-C REFERRING PROVIDER: [name], Corrine Jeani Hawking, PA-C   OT End of Session - 02/18/22 1419     Visit Number 29    Number of Visits 99    Date for OT Re-Evaluation 04/28/22    Authorization Time Period Reporting period beginning 02/05/22    OT Start Time 1515    OT Stop Time 1600    OT Time Calculation (min) 45 min    Activity Tolerance Patient tolerated treatment well    Behavior During Therapy [facility] for tasks assessed/performed            Past Medical History:  Diagnosis Date   ABDOMINAL PAIN OTHER SPECIFIED SITE 02/21/2009   Qualifier: Diagnosis of  By: Deborra Medina MD, Talia     ABDOMINAL PAIN-LUQ 04/17/2009   Qualifier: Diagnosis of  By: Fuller Plan MD Lamont Snowball T    Anal fissure    Anginal pain ([place])    Asthma    Depression    Diabetes mellitus without complication ([place])    Dyspnea    Dysrhythmia    Headache(784.0)    HEMATOCHEZIA 04/17/2009   Qualifier: Diagnosis of  By: Fuller Plan MD Lamont Snowball T    Hyperlipidemia    Hypertension    IBS (irritable bowel syndrome)    Migraine headache 11/06/2008   Qualifier: Diagnosis of  By: Deborra Medina MD, Talia     OVARIAN CYST, RIGHT 02/21/2009   Qualifier: Diagnosis of  By: Deborra Medina MD, Talia     PALPITATIONS, OCCASIONAL 04/17/2010   Qualifier: Diagnosis of  By: Deborra Medina MD, Talia     Pancreatitis 10/14/2017   Past Surgical History:  Procedure Laterality Date   BREAST EXCISIONAL BIOPSY Left 1997   ? left side, done at time of reduction , benign   BREAST REDUCTION SURGERY     bilateral   CESAREAN SECTION     CHOLECYSTECTOMY     COLONOSCOPY     ESOPHAGOGASTRODUODENOSCOPY     ESOPHAGOGASTRODUODENOSCOPY (EGD) WITH PROPOFOL N/A 10/21/2020   Procedure: ESOPHAGOGASTRODUODENOSCOPY (EGD) WITH PROPOFOL;  Surgeon: Lesly Rubenstein, MD;  Location: [facility] ENDOSCOPY;  Service:  Endoscopy;  Laterality: N/A;   REDUCTION MAMMAPLASTY Bilateral 1997   VAGINAL HYSTERECTOMY     Patient Active Problem List   Diagnosis Date Noted   Abnormal MRI, lumbar spine (01/29/2020) 02/19/2020   Anxiety due to MRI 01/25/2020   Claustrophobia associated to MRI 01/25/2020   Enthesopathy of knee region (Right) 01/25/2020   Enthesopathy of knee region (Left) 01/25/2020   Lumbar facet hypertrophy 01/25/2020   Lumbosacral radiculopathy at S1 (Left) 01/17/2020   Lumbosacral radiculopathy (S1) (Left) 01/17/2020   Chronic lower extremity pain (Bilateral) (L>R) 01/17/2020   History of pancreatitis 07/18/2019   Obstructive sleep apnea syndrome 01/02/2019   Lumbar facet arthropathy (Multilevel) (Bilateral) 12/14/2018   Osteoarthritis involving multiple joints 11/28/2018   Chronic hip pain (Left) 11/16/2018   Acute hip pain (Left) 11/16/2018   DDD (degenerative disc disease), lumbosacral 11/16/2018   Chronic hip pain (Secondary source of pain) (Bilateral) (L>R) 05/19/2017   Elevated C-reactive protein (CRP) 05/19/2017   Elevated sed rate 05/19/2017   Spondylosis without myelopathy or radiculopathy, lumbosacral region 05/19/2017   Pharmacologic therapy 05/06/2017   Disorder of skeletal system 05/06/2017   Problems influencing health status 05/06/2017   Chest pain with moderate risk of acute coronary syndrome 04/21/2017   DOE (dyspnea on  exertion) 04/21/2017   Intermittent palpitations 04/21/2017   Vitamin D insufficiency 05/04/2016   Depression 02/04/2016   Fatty liver 02/04/2016   Hypertension associated with diabetes ([place]) 02/04/2016   Chronic pain syndrome 01/15/2016   Acute low back pain 05/28/2015   Lumbar transverse process fracture (HCC) (Left L1) (03/11/15) 05/28/2015   Long term current use of opiate analgesic 05/01/2015   Long term prescription opiate use 05/01/2015   Opiate use (25 MME/Day) 05/01/2015   Encounter for therapeutic drug level monitoring 05/01/2015    Encounter for pain management planning 05/01/2015   Chronic low back pain (Primary Source of Pain) (Bilateral) (L>R) 05/01/2015   Lumbar facet syndrome (Bilateral) (L>R) 05/01/2015   Chronic knee pain [facility] source of pain) (Bilateral) (L>R) 05/01/2015   Lumbar spondylosis 05/01/2015   Neuropathic pain 05/01/2015   Neurogenic pain 05/01/2015   Myofascial pain 05/01/2015   Essential hypertriglyceridemia 04/26/2014   Type 2 diabetes mellitus with hyperglycemia, with long-term current use of insulin ([place]) 04/26/2014   Anxiety 05/15/2010   Obesity 05/15/2010   Hyperlipidemia 04/03/2010   Fatty liver disease 04/02/2010   Pure hypercholesterolemia 03/19/2010   GERD 04/17/2009   Constipation 04/17/2009   TOBACCO ABUSE 11/06/2008    ONSET DATE: 02/06/2021  REFERRING DIAG: Astrocytoma  THERAPY DIAG:  Muscle weakness (generalized)  Other lack of coordination  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HCC)  Rationale for Evaluation and Treatment Rehabilitation  SUBJECTIVE:     SUBJECTIVE STATEMENT: Pt reports she and her spouse made a trip to the beach for 3 nights this past weekend.  Pt reported that she was able to sleep on her back in the bed all 3 nights, though it took about 45 min the first night for spouse to comfortably position pt in the bed. Pt accompanied by: significant other  PERTINENT HISTORY:  Pt. Is a 52 y.o. 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 PMHx includes: Anorexia, dehydration, Adult Failure to thrive, contracture of muscles multiple sites. Pt./caregiver reports recently being hospitalized with a UTI, and dehydration.   PRECAUTIONS: Fall  WEIGHT BEARING RESTRICTIONS No  PAIN:  Are you having pain?  Pt. reports 6/10 pain in the entire R side.   FALLS: Has patient fallen in last 6 months? Yes. Number of falls Multiple Falls  LIVING ENVIRONMENT: Lives with: Family Lives in:  House/apartment Stairs: Yes: External: 4 steps; bilateral but cannot reach both Has following equipment at home: Single point cane, Walker - 4 wheeled, Hemi walker, shower chair, bed side commode, and Grab bars Lift chair in the tub, [place],  PLOF: Independent  PATIENT GOALS: To improve the right UE  OBJECTIVE:   HAND DOMINANCE: Right  ADLs: Transfers/ambulation related to ADLs: Eating: MaxA with cutting food, setting up, Modified I with nondominant Left hand, [place] Mat  Grooming: Independent  UB Dressing: MaxA  LB Dressing: MaxA Toileting: Uses a BSCommode mInA transfers, MaxA clothing negotiation Bathing: MaxA Tub Shower transfers:  Uses a shower lift chair Equipment:  w/c, BSCommode, cane   IADLs: Shopping: Husband performs Light housekeeping: Max Meal Prep: MaxA Community mobility: Relies on family Medication management:Husband assists Doctor, hospital: Performs together Handwriting: Not legible  MOBILITY STATUS: Hx of falls   ACTIVITY TOLERANCE: Activity tolerance: limited  FUNCTIONAL OUTCOME MEASURES: FOTO: 11.76  UPPER EXTREMITY ROM  : WFL Left AROM/PROM   Passive ROM Right eval Right  08/18/21 Right 09/08/2021 Right 10/30/2021 Right 02/03/22  Shoulder flexion 30  48 15 55  Shoulder abduction 40 62 53 45 55  Shoulder adduction          Shoulder extension          Shoulder internal rotation          Shoulder external rotation          Elbow flexion 120   110 122 140  Elbow extension -67   -46 -72 -30  Wrist flexion Neutral   Neutral neutral 60  Wrist extension 12   40 25 28  Wrist ulnar deviation          Wrist radial deviation          Wrist pronation          Wrist supination          (Blank rows = not tested)     UPPER EXTREMITY MMT:      MMT Right eval Right 02/03/22 Left eval  Shoulder flexion 0/5 0/5 4+/5  Shoulder abduction 0/5 0/5 4+/5  Shoulder adduction       Shoulder extension       Shoulder internal rotation       Shoulder  external rotation       Middle trapezius       Lower trapezius       Elbow flexion 0/5 0/5 4+/5  Elbow extension 0/5 0/5 4+/5  Wrist flexion       Wrist extension 0/5 0/5 4+/5  Wrist ulnar deviation       Wrist radial deviation       Wrist pronation       Wrist supination       (Blank rows = not tested)   FOTO score: 09/08/2021: 11   FOTO score 02/03/22: 11.76  SENSATION: Light touch: WFL  EDEMA:   Wrist: 18.5 cm MCP: 21.5 cm  10/30/2021:  Wrist: 17.5 cm MCPs: 20cm  02/03/22:  Wrist: 17.3 cm MCP: 20.0 cm   MUSCLE TONE: RUE: Severe  COGNITION: Overall cognitive status: Impaired  VISION: Subjective report: Wears glasses Baseline vision: Wears glasses all the time Pt. Reports no changes   PRAXIS: Impaired: Motor planning  TODAY'S TREATMENT:  Moist heat applied to R shoulder and hand for pain management and muscle relaxation, intermittently throughout session during rest breaks from assessment activities.   Therapeutic Exercise: Performed passive stretching throughout the RUE, working to reduce tone, increase muscle relaxation and maximize ROM to ease self care tasks.  Performed passive R shoulder depression, retraction/protraction, R shoulder flex, abd, and ER/IR with elbow at side, elbow flex/ext, forearm pron/sup, wrist flex/ext, and digit flex/ext/abd.  With slow, prolonged stretch, OT able to passively extend digits for pt to rest hand in about 90% of full digit ext on top of hot pack.     Self Care: Following passive stretching, OT was able to position each digit into sufficient extension on lap pillow for pt and spouse to clip R hand nails.  Pt made attempts at using L hand to clip R, but did not have sufficient pinch strength to manage this, so spouse clipped nails.  Pt was able to use L hand to file R hand nails using a built up nail file holder in the L hand while OT maintained digit extension in 1 digit at a time.  OT provided resources to obtain file holder as  desired as pt reported the holder was very helpful.  PATIENT EDUCATION: Education details: grooming AE Person educated: Patient and Spouse  Education method: explanation, vc Education comprehension: verbalized/demonstrated understanding, additional educ needed   HOME EXERCISE PROGRAM:   Pt./caregiver education about positioning of the RUE, ROM  GOALS: Goals reviewed with patient? Yes  SHORT TERM GOALS: Target date: 03/17/22 Pt./caregivers will demonstrate independence with HEPs for UE ROM, and edema control Baseline: 02/03/22: Caregiver assist for RUE positioning to reduce edema.  Improved from initial eval.  Additional training needed for caregiver PROM throughout the RUE.  10/30/2021: Pt. Edema has improved. Pt. Requires caregiver assist for positioning. 09/08/2021: Pt. Requires assist for positioning, ROM, edema control. Eval: No current HEPs  Goal status: Ongoing  LONG TERM GOALS: Target date: 04/28/22 Pt. Will improve right hand edema by 1 cm at the wrist, and MCPs Baseline: 03/03/22: wrist 17.5, MCPs: 19.6 cm 02/03/22: wrist 17.3 cm, MCPs: 20 cm; 10/30/2021:  wrist: 17.5 cm, MCPs: 20 cm 09/08/2021: Wrist: 17.5 cm, MCPs: 20.5 cm Eval: Wrist: 18.5cm, MCPs: 21.5cm Goal status: Ongoing  2.  Pt. Will improve right shoulder PROM to 90 degrees for improved tolerance to UB ADLs. Baseline: 02/03/22: shoulder flexion 55, abd 55; 10/30/2021: shoulder flexion: 15, abduction: 45 09/08/2021: Right shoulder flexion: 48, abduction: 53 Eval: PROM shoulder flexion: 30 degrees, Abduction: 40 degrees Goal status: Ongoing/ revised  3.  Pt. Will improve right elbow PROM by 10 degrees to prepare for hand to mouth patterns Baseline: 02/03/22: elbow flex 140, ext -30; 10/30/2021: elbow extension -72, flexion: 122 09/08/2021: elbow extension: -46, flexion: 110 Eval:   elbow extension -67, flexion 120 Goal status: achieved  4.  Pt. Will improve PROM right wrist extension to 45 degrees or better to enable WB through the  hand during transfers and ADLs. Baseline: 02/03/22: wrist ext 28; 10/30/2021: wrist extension 25 09/08/2021: Wrist extension: 40 Eval: wrist extension 12 degrees, flexion to neutral Goal status: Ongoing/ revised  5.  Pt. Will tolerate full digit extension in the right hand to promote good skin integrity through the palmar surface of the right hand  Baseline: 02/03/22: Pt tolerates ROM through ~75% of passive digit extension range; 10/30/2021: Pt. Tolerates ROM through 50% of  digit extension range, occasionally 75% 09/08/2021: Pt. Is able to tolerate PROM through 50% of digit extension in the digits. Eval: Pt. Maintains digits in tight flexion Goal status: Ongoing  6.  Pt. Will require minA UE dressing using one armed dressing techniques. Baseline: 02/03/22: max A pull over shirt; 10/30/2021: Independent with button down shirt/sweater/cardigan, MaxA pullover shirt.09/08/2021: MaxA Eval: MaxA Goal status: Ongoing   7. Pt will tolerate 15 min or more of supine lying for caregiver to perform PROM throughout the RUE and for benefits of pressure relief and trunk extension.    Baseline: 02/03/22: pt has not yet attempted at home but was able to tolerate 10 min in supine on mat in clinic.  Goal status: New  ASSESSMENT:  CLINICAL IMPRESSION: Pt reports she and her spouse made a trip to the beach for 3 nights this past weekend.  Pt reported that she was able to sleep on her back in the bed all 3 nights, though it took about 45 min the first night for spouse to comfortably position pt in the bed.  Both report frequent attempts at stretching RUE over their weekend trip.  R hand was able to open 90% of full extension with passive stretching this date, which was an improvement compared to any attempts over the last month.  Following passive stretching, OT was able to position each digit into sufficient extension  on lap pillow for pt and spouse to clip R hand nails.  Pt made attempts at using L hand to clip R, but did not  have sufficient pinch strength to manage this, so spouse clipped nails.  Pt was able to use L hand to file R hand nails using a built up nail file holder in the L hand (mod A for thoroughness) while OT maintained digit extension in 1 digit at a time.  OT provided resources to obtain file holder as desired as pt reported the holder was very helpful.  Will continue to benefit from OT to work towards improving pain, ROM, and to increase engagement of the RUE during ADLs, and IADLs.   PERFORMANCE DEFICITS in functional skills including ADLs, IADLs, coordination, proprioception, sensation, edema, tone, ROM, strength, pain, FMC, GMC, balance, decreased knowledge of use of DME, skin integrity, and UE functional use, cognitive skills including attention, memory, and safety awareness, and psychosocial skills including coping strategies.   IMPAIRMENTS are limiting patient from ADLs, IADLs, education, work, leisure, and social participation.   COMORBIDITIES may have co-morbidities  that affects occupational performance. Patient will benefit from skilled OT to address above impairments and improve overall function.  MODIFICATION OR ASSISTANCE TO COMPLETE EVALUATION: Maximum modification of tasks or assist with assess necessary to complete an evaluation.  OT OCCUPATIONAL PROFILE AND HISTORY: Detailed assessment: Review of records and additional review of physical, cognitive, psychosocial history related to current functional performance.  CLINICAL DECISION MAKING: High - multiple treatment options, significant modification of task necessary  REHAB POTENTIAL: Good for stated goals  EVALUATION COMPLEXITY: High    PLAN: OT FREQUENCY: 1-2x/week  OT DURATION: 12 weeks  PLANNED INTERVENTIONS: self care/ADL training, therapeutic exercise, therapeutic activity, neuromuscular re-education, manual therapy, passive range of motion, paraffin, moist heat, contrast bath, patient/family education, cognitive  remediation/compensation, energy conservation, and coping strategies training  RECOMMENDED OTHER SERVICES: PT, and ST  CONSULTED AND AGREED WITH PLAN OF CARE: Patient and family member/caregiver  PLAN FOR NEXT SESSION: Initiate PROM, and RUE Edema control techniques   Leta Speller, MS, OTR/L  Darleene Cleaver, OT 02/18/2022, 2:20 PM

## 2022-03-03 NOTE — Therapy (Signed)
OUTPATIENT PHYSICAL THERAPY NEURO TREATMENT/Physical Therapy Progress Note   Dates of reporting period  11/18/2021   to   03/03/2022     Patient Name: Kelly Rollins MRN: [id number] DOB:May 21, 1970, 52 y.o., female Today's Date: 03/03/2022   PCP: [name], PA-C REFERRING PROVIDER: [name], Bonnell Public, PA-C   PT End of Session - 03/03/22 1608     Visit Number 20    Number of Visits 14    Date for PT Re-Evaluation 03/12/22    Authorization Type [employer] Medicare    Authorization Time Period 09/16/21-12/09/21    PT Start Time 1604    PT Stop Time 1640    PT Time Calculation (min) 36 min    Equipment Utilized During Treatment Gait belt    Activity Tolerance Patient tolerated treatment well;Patient limited by fatigue    Behavior During Therapy [facility] for tasks assessed/performed                        Past Medical History:  Diagnosis Date   ABDOMINAL PAIN OTHER SPECIFIED SITE 02/21/2009   Qualifier: Diagnosis of  By: Deborra Medina MD, Talia     ABDOMINAL PAIN-LUQ 04/17/2009   Qualifier: Diagnosis of  By: Fuller Plan MD Lamont Snowball T    Anal fissure    Anginal pain ([place])    Asthma    Depression    Diabetes mellitus without complication ([place])    Dyspnea    Dysrhythmia    Headache(784.0)    HEMATOCHEZIA 04/17/2009   Qualifier: Diagnosis of  By: Fuller Plan MD Lamont Snowball T    Hyperlipidemia    Hypertension    IBS (irritable bowel syndrome)    Migraine headache 11/06/2008   Qualifier: Diagnosis of  By: Deborra Medina MD, Talia     OVARIAN CYST, RIGHT 02/21/2009   Qualifier: Diagnosis of  By: Deborra Medina MD, Talia     PALPITATIONS, OCCASIONAL 04/17/2010   Qualifier: Diagnosis of  By: Deborra Medina MD, Talia     Pancreatitis 10/14/2017   Past Surgical History:  Procedure Laterality Date   BREAST EXCISIONAL BIOPSY Left 1997   ? left side, done at time of reduction , benign   BREAST REDUCTION SURGERY     bilateral   CESAREAN SECTION     CHOLECYSTECTOMY     COLONOSCOPY      ESOPHAGOGASTRODUODENOSCOPY     ESOPHAGOGASTRODUODENOSCOPY (EGD) WITH PROPOFOL N/A 10/21/2020   Procedure: ESOPHAGOGASTRODUODENOSCOPY (EGD) WITH PROPOFOL;  Surgeon: Lesly Rubenstein, MD;  Location: [facility] ENDOSCOPY;  Service: Endoscopy;  Laterality: N/A;   REDUCTION MAMMAPLASTY Bilateral 1997   VAGINAL HYSTERECTOMY     Patient Active Problem List   Diagnosis Date Noted   Abnormal MRI, lumbar spine (01/29/2020) 02/19/2020   Anxiety due to MRI 01/25/2020   Claustrophobia associated to MRI 01/25/2020   Enthesopathy of knee region (Right) 01/25/2020   Enthesopathy of knee region (Left) 01/25/2020   Lumbar facet hypertrophy 01/25/2020   Lumbosacral radiculopathy at S1 (Left) 01/17/2020   Lumbosacral radiculopathy (S1) (Left) 01/17/2020   Chronic lower extremity pain (Bilateral) (L>R) 01/17/2020   History of pancreatitis 07/18/2019   Obstructive sleep apnea syndrome 01/02/2019   Lumbar facet arthropathy (Multilevel) (Bilateral) 12/14/2018   Osteoarthritis involving multiple joints 11/28/2018   Chronic hip pain (Left) 11/16/2018   Acute hip pain (Left) 11/16/2018   DDD (degenerative disc disease), lumbosacral 11/16/2018   Chronic hip pain (Secondary source of pain) (Bilateral) (L>R) 05/19/2017   Elevated C-reactive protein (CRP) 05/19/2017  Elevated sed rate 05/19/2017   Spondylosis without myelopathy or radiculopathy, lumbosacral region 05/19/2017   Pharmacologic therapy 05/06/2017   Disorder of skeletal system 05/06/2017   Problems influencing health status 05/06/2017   Chest pain with moderate risk of acute coronary syndrome 04/21/2017   DOE (dyspnea on exertion) 04/21/2017   Intermittent palpitations 04/21/2017   Vitamin D insufficiency 05/04/2016   Depression 02/04/2016   Fatty liver 02/04/2016   Hypertension associated with diabetes ([place]) 02/04/2016   Chronic pain syndrome 01/15/2016   Acute low back pain 05/28/2015   Lumbar transverse process fracture (HCC) (Left L1)  (03/11/15) 05/28/2015   Long term current use of opiate analgesic 05/01/2015   Long term prescription opiate use 05/01/2015   Opiate use (25 MME/Day) 05/01/2015   Encounter for therapeutic drug level monitoring 05/01/2015   Encounter for pain management planning 05/01/2015   Chronic low back pain (Primary Source of Pain) (Bilateral) (L>R) 05/01/2015   Lumbar facet syndrome (Bilateral) (L>R) 05/01/2015   Chronic knee pain [facility] source of pain) (Bilateral) (L>R) 05/01/2015   Lumbar spondylosis 05/01/2015   Neuropathic pain 05/01/2015   Neurogenic pain 05/01/2015   Myofascial pain 05/01/2015   Essential hypertriglyceridemia 04/26/2014   Type 2 diabetes mellitus with hyperglycemia, with long-term current use of insulin ([place]) 04/26/2014   Anxiety 05/15/2010   Obesity 05/15/2010   Hyperlipidemia 04/03/2010   Fatty liver disease 04/02/2010   Pure hypercholesterolemia 03/19/2010   GERD 04/17/2009   Constipation 04/17/2009   TOBACCO ABUSE 11/06/2008    ONSET DATE: January 2023  REFERRING DIAG: C71.9 (ICD-10-CM) - Malignant neoplasm of brain, unspecified  THERAPY DIAG:  Other lack of coordination  Muscle weakness (generalized)  Unsteadiness on feet  Difficulty in walking, not elsewhere classified  Rationale for Evaluation and Treatment Rehabilitation  SUBJECTIVE:                                                                                                                                                                                              SUBJECTIVE STATEMENT: Pt reports pain level currently 3-5/10, says depending on "how much I move."  Pt reports she was sick last week. Pt is feeling better now. Pt spouse reports tumor "has subsided a little bit" regarding recent MRI. Pt attempted some ambulation with UE on buggy  in [place], husband was with her for this. Pt spouse reports pt wound has improved.  Pt accompanied by:  [name]  PAIN:  Are you having pain?  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 Pt's husband, [name], present with pt. [name] provides majority of hx as pt has difficulty with speech due to Broca's aphasia (working with SLP). Pt reports partial paralysis of R side. She is unable to move her RUE, RLE also affected. Pt ambulates with hemiwalker in their home for short distances and does not use WC at home. She fatigues quickly with ambulation. Pt has AFO for RLE but unable to determine how to donn it without it hurting her LE. Pt currently with wound on R calf covered by large bandage that they have been treating for at least a month (reports physician aware). In addition to wound on R calf, pt reports R side skin irritation under breast and lateral trunk due to bra underwire. They have since removed underwire. Pt was hospitalized 10 days in June due to UTI. She has had at least 12 falls in the last six months. Reports no injuries with falls, and states "just bumps and bruises." Pt reports she has constant head pain. Pt spouse reports pt has pain throughout her whole right side down to LE. Can have bad cramps in RUE and RLE. Reports no numbness/tingling. Occ dizziness, unable to describe further. Pt sleeps in recliner, wakes up stiff in the morning. PMH per chart includes: Anorexia, dehydration, Adult Failure to thrive, contracture of muscles multiple sites. Pt./caregiver reports recently being hospitalized with a UTI, and dehydration. Extensive hx, please refer to chart for full details.   PAIN: from eval Are you having pain? Yes: NPRS scale: 3/10 Pain location: Top of head, front Pain description: States, "cancer pain" head pain Aggravating factors: unsure Relieving factors: medications  PRECAUTIONS: Fall  WEIGHT BEARING RESTRICTIONS No  FALLS: Has patient fallen in  last 6 months? Yes. Number of falls 12  LIVING ENVIRONMENT: information primarily from chart Lives with: lives with their spouse Lives in: House/apartment Stairs: Yes: External: 4 steps; bilateral but cannot reach both Has following equipment at home: Single point cane, Walker - 4 wheeled, Hemi walker, shower chair, bed side commode, Grab bars, and lift chair  PLOF: Independent  PATIENT GOALS  "Normal function" as much as possible. Improving her balance.    TODAY'S TREATMENT:    TherAct:   Goal retesting completed today, please refer to goal section below for full details   10MWT   1 warm-up round  Trial 1: 0.13 m/s   5xSTS  Warm-up trial  Completes 5xSTS in 30 sec using pull to stand technique     PATIENT EDUCATION: Education details: Pt educated throughout session about proper posture and technique with exercises. Improved exercise technique, movement at target joints, use of target muscles after min to mod verbal, visual, tactile cues.   Person educated: pt and her spouse Education method:Explanation, Demonstration, Tactile cues, and Verbal cues, Education comprehension: verbalized understanding, returned demonstration, tactile cues required, and needs further education   HOME EXERCISE PROGRAM:  Pt to perform only as many reps of STS per set as able  Access Code: [email or web address] URL: [email or web address] Date: 11/18/2021 Prepared by: Ricard Dillon  Exercises - Elbow Extension with Anchored Resistance  - 1 x daily - 5 x weekly - 3 sets - 10 reps  Access Code: [email or web address] URL: [email or web address] Date: 10/02/2021 Prepared by: Sande Brothers  Exercises - Seated Hip Flexion March with Ankle Weights  - 1 x daily - 7 x weekly - 3 sets - 10 reps - Seated Long Arc Quad  - 1  x daily - 7 x weekly - 3 sets - 10 reps - Supine Quad Set  - 1 x daily - 7 x weekly - 3 sets - 10 reps - Proper Sit to Stand Technique  - 1 x daily - 7 x weekly - 3 sets -  10 reps  GOALS: Goals reviewed with patient? Yes  SHORT TERM GOALS: Target date: 01/29/2022   Patient will be independent in home exercise program to improve strength/mobility for better functional independence with ADLs. Baseline: to be initiated next 1-2 sessions; 10/17 previously issued, pt is 11/16: to be advanced as pt has gained strength since previous assessment  Goal status: ONGOING   LONG TERM GOALS: Target date: 03/12/2022   Patient will demo ability to perform at least 1 STS by pushing from [facility] with LUE instead of pulling to stand to improve functional mobility Baseline: Goal changed as pt FOTO tracked by OT, with new goal: pt currently pulls to stand; 11/16: deferred due to pt feeling unwell; 11/22: able to complete with mod assist; 03/03/2022: unable to complete STS with push to stand technique Goal status: ONGOING   2.   Patient (< 61 years old) will be able to perform and complete five times sit to stand test from standard chair height in <30 seconds to indicate increased LE strength and improved balance. Baseline: Pt able to complete only 1 stand, insufficient strength currently to complete full test; 10/17 31 seconds pull to stand with UE, close CGA-min a 11/16: deferred due to pt feeling unwell; 03/03/2022: 30 sec pull to stand technique used Goal status:  PARTIALLY MET  3.  Pt will improve LLE strength by at least 1/2 point on MMT in order to increase ease with transfers and functional mobility. Baseline: LLE grossly 4/5 throughout; 10/17: still generally 4/5 B, however now exhibits AROM with R hip flexion and knee ext; 11/16: 4+/5 LLE, RLE 3-/5, noted improvement however in quads and hip flexors  Goal status: MET  4.  Patient will increase 10 meter walk test to at least 0.80 m/s as to improve gait speed for better community ambulation and to reduce fall risk. Baseline: to be tested next 1-2 visits; 10/17: pt unable to perform 11/16: deferred due to pt feeling unwell;  11/21: 0.17 m/s; 03/03/22: 0.13 m/s with HW Goal status: IN PROGRESS  5.  Pt will complete STS from standard height chair with UUE assist off armrests and with no greater than min assist to improve ease and safety with transfers. Baseline: mod a x2; 10/17: pulling to stand with LUE CGA 11/16: deferred due to pt feeling unwell; 11/21: currently requires mod a to complete; 03/03/2022: pt attempted indep today but unable to complete Goal status: IN PROGRESS,   6.  Pt will improve RLE strength by at least 1/2 point on MMT in order to increase ease with transfers and functional mobility. Baseline: 11/16: 4+/5 LLE, RLE 3-/5, noted improvement however in quads and hip flexors on R; 03/03/2022: 3-/5 RLE, noted increased tone throughout RLE musculature  Goal status: NEW   ASSESSMENT:  CLINICAL IMPRESSION: Goals reassessed for progress note. Pt with similar performance with majority of goals compared to prior assessment. Pt with somewhat greater difficulty performing RLE MMT due to noted increased in RLE tone (pt waiting for appointment for botox). Pt also with slight decrease in 10MWT performance. The pt was recently absent from PT due to an illness, this likely has impacted her performance today. The pt would benefit from continued  skilled PT to address immobility, LE muscle weakness, and pain in order to decrease fall risk and increase functional mobility and QOL.   OBJECTIVE IMPAIRMENTS Abnormal gait, decreased activity tolerance, decreased balance, decreased coordination, decreased endurance, decreased mobility, difficulty walking, decreased ROM, decreased strength, hypomobility, increased edema, increased fascial restrictions, increased muscle spasms, impaired flexibility, impaired sensation, impaired tone, impaired UE functional use, improper body mechanics, postural dysfunction, and pain.   ACTIVITY LIMITATIONS carrying, lifting, bending, sitting, standing, squatting, stairs, transfers, bed mobility,  bathing, toileting, dressing, reach over head, hygiene/grooming, locomotion level, and caring for others  PARTICIPATION LIMITATIONS: meal prep, cleaning, laundry, medication management, personal finances, driving, shopping, community activity, occupation, and yard work  PERSONAL Education officer, community, Past/current experiences, Sex, and 3+ comorbidities: Anorexia, dehydration, Adult Failure to thrive, contracture of muscles multiple sites, multiple falls, UTI, extensive PMH refer to chart  are also affecting patient's functional outcome.   REHAB POTENTIAL: Fair    CLINICAL DECISION MAKING: Evolving/moderate complexity  EVALUATION COMPLEXITY: High  PLAN: PT FREQUENCY: 2x/week  PT DURATION: 12 weeks  PLANNED INTERVENTIONS: Therapeutic exercises, Therapeutic activity, Neuromuscular re-education, Balance training, Gait training, Patient/Family education, Self Care, Joint mobilization, Joint manipulation, Stair training, Vestibular training, Canalith repositioning, Orthotic/Fit training, DME instructions, Wheelchair mobility training, Spinal mobilization, Cryotherapy, Moist heat, scar mobilization, Splintting, Taping, Manual therapy, and Re-evaluation  PLAN FOR NEXT SESSION:  stretching, strengthening, gait, balance, manual, standing balance, RLE stretching 4:57 PM, 03/03/22   Zollie Pee, PT 03/03/2022, 4:57 PM

## 2022-03-05 ENCOUNTER — Encounter: Payer: Medicare Other | Admitting: Speech Pathology

## 2022-03-05 ENCOUNTER — Ambulatory Visit: Payer: Medicare Other

## 2022-03-09 NOTE — Therapy (Signed)
OUTPATIENT PHYSICAL THERAPY WHEELCHAIR EVALUATION   Patient Name: Kelly Rollins MRN: [id number] DOB:24-Apr-1970, 52 y.o., female Today's Date: 03/11/2022  END OF SESSION:  PT End of Session - 03/11/22 1307     Visit Number 1    Number of Visits 1    Date for PT Re-Evaluation 03/10/22    PT Start Time 1608    PT Stop Time 1710    PT Time Calculation (min) 62 min    Equipment Utilized During Treatment Gait belt    Activity Tolerance Patient tolerated treatment well    Behavior During Therapy WFL for tasks assessed/performed             Past Medical History:  Diagnosis Date   ABDOMINAL PAIN OTHER SPECIFIED SITE 02/21/2009   Qualifier: Diagnosis of  By: Deborra Medina MD, Talia     ABDOMINAL PAIN-LUQ 04/17/2009   Qualifier: Diagnosis of  By: Fuller Plan MD Lamont Snowball T    Anal fissure    Anginal pain ([place])    Asthma    Depression    Diabetes mellitus without complication ([place])    Dyspnea    Dysrhythmia    Headache(784.0)    HEMATOCHEZIA 04/17/2009   Qualifier: Diagnosis of  By: Fuller Plan MD Lamont Snowball T    Hyperlipidemia    Hypertension    IBS (irritable bowel syndrome)    Migraine headache 11/06/2008   Qualifier: Diagnosis of  By: Deborra Medina MD, Talia     OVARIAN CYST, RIGHT 02/21/2009   Qualifier: Diagnosis of  By: Deborra Medina MD, Talia     PALPITATIONS, OCCASIONAL 04/17/2010   Qualifier: Diagnosis of  By: Deborra Medina MD, Talia     Pancreatitis 10/14/2017   Past Surgical History:  Procedure Laterality Date   BREAST EXCISIONAL BIOPSY Left 1997   ? left side, done at time of reduction , benign   BREAST REDUCTION SURGERY     bilateral   CESAREAN SECTION     CHOLECYSTECTOMY     COLONOSCOPY     ESOPHAGOGASTRODUODENOSCOPY     ESOPHAGOGASTRODUODENOSCOPY (EGD) WITH PROPOFOL N/A 10/21/2020   Procedure: ESOPHAGOGASTRODUODENOSCOPY (EGD) WITH PROPOFOL;  Surgeon: Lesly Rubenstein, MD;  Location: [facility] ENDOSCOPY;  Service: Endoscopy;  Laterality: N/A;   REDUCTION MAMMAPLASTY Bilateral 1997    VAGINAL HYSTERECTOMY     Patient Active Problem List   Diagnosis Date Noted   Abnormal MRI, lumbar spine (01/29/2020) 02/19/2020   Anxiety due to MRI 01/25/2020   Claustrophobia associated to MRI 01/25/2020   Enthesopathy of knee region (Right) 01/25/2020   Enthesopathy of knee region (Left) 01/25/2020   Lumbar facet hypertrophy 01/25/2020   Lumbosacral radiculopathy at S1 (Left) 01/17/2020   Lumbosacral radiculopathy (S1) (Left) 01/17/2020   Chronic lower extremity pain (Bilateral) (L>R) 01/17/2020   History of pancreatitis 07/18/2019   Obstructive sleep apnea syndrome 01/02/2019   Lumbar facet arthropathy (Multilevel) (Bilateral) 12/14/2018   Osteoarthritis involving multiple joints 11/28/2018   Chronic hip pain (Left) 11/16/2018   Acute hip pain (Left) 11/16/2018   DDD (degenerative disc disease), lumbosacral 11/16/2018   Chronic hip pain (Secondary source of pain) (Bilateral) (L>R) 05/19/2017   Elevated C-reactive protein (CRP) 05/19/2017   Elevated sed rate 05/19/2017   Spondylosis without myelopathy or radiculopathy, lumbosacral region 05/19/2017   Pharmacologic therapy 05/06/2017   Disorder of skeletal system 05/06/2017   Problems influencing health status 05/06/2017   Chest pain with moderate risk of acute coronary syndrome 04/21/2017   DOE (dyspnea on exertion) 04/21/2017   Intermittent palpitations 04/21/2017  Vitamin D insufficiency 05/04/2016   Depression 02/04/2016   Fatty liver 02/04/2016   Hypertension associated with diabetes ([place]) 02/04/2016   Chronic pain syndrome 01/15/2016   Acute low back pain 05/28/2015   Lumbar transverse process fracture (HCC) (Left L1) (03/11/15) 05/28/2015   Long term current use of opiate analgesic 05/01/2015   Long term prescription opiate use 05/01/2015   Opiate use (25 MME/Day) 05/01/2015   Encounter for therapeutic drug level monitoring 05/01/2015   Encounter for pain management planning 05/01/2015   Chronic low back pain  (Primary Source of Pain) (Bilateral) (L>R) 05/01/2015   Lumbar facet syndrome (Bilateral) (L>R) 05/01/2015   Chronic knee pain [facility] source of pain) (Bilateral) (L>R) 05/01/2015   Lumbar spondylosis 05/01/2015   Neuropathic pain 05/01/2015   Neurogenic pain 05/01/2015   Myofascial pain 05/01/2015   Essential hypertriglyceridemia 04/26/2014   Type 2 diabetes mellitus with hyperglycemia, with long-term current use of insulin ([place]) 04/26/2014   Anxiety 05/15/2010   Obesity 05/15/2010   Hyperlipidemia 04/03/2010   Fatty liver disease 04/02/2010   Pure hypercholesterolemia 03/19/2010   GERD 04/17/2009   Constipation 04/17/2009   TOBACCO ABUSE 11/06/2008    PCP: [name], PA-C   REFERRING PROVIDER:   Heltemes, Kris Hartmann, FNP    REFERRING DIAG:  Diagnosis  C71.9 (ICD-10-CM) - Malignant neoplasm of brain, unspecified  G81.91 (ICD-10-CM) - Hemiplegia, unspecified affecting right dominant side  M62.838 (ICD-10-CM) - Other muscle spasm    THERAPY DIAG:  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[place])  Muscle weakness (generalized)  Unsteadiness on feet  Other lack of coordination  Difficulty in walking, not elsewhere classified  Cervicalgia  ONSET DATE: 02/06/2021  Rationale for Evaluation and Treatment: Rehabilitation  SUBJECTIVE:                                                                                                                                                                                           PATIENT INFORMATION: This Evaluation form will serve as the LMN for the following suppliers:  Supplier: [employer] Contact Person: Luz Brazen, [employer] Phone: [phone]   Reason for Referral: power wheelchair evaluation Patient/caregiver Goals: Receive power wheelchair to promote safety and independence with ADLs Patient was seen for face-to-face evaluation for new power wheelchair.  Also present was    [employer], [employer]  to discuss recommendations and  wheelchair options.  Further paperwork was completed and sent to vendor.  Patient appears to qualify for power mobility device at this time per objective findings.    MEDICAL HISTORY: Malignant neoplasm of brian, hemiplegia affecting R dominant side, other pmh consists of anorexia, dehydration, Adult Failure to thrive,  contracture of muscles multiple sites. Hx of UTI with hospitalization, anginal pain, DM, dysrhythmia, headache, HTN, IBS, pancreatitis, chronic LBP, chronic knee pain bilateral, lumbar spondylosis, neuropathic pain, neurogenic pain, myofascial pain,chronic pain syndrome, DDD, OA involving multiple joints, OSA, depression, extensive PMH, please refer to chart for full details  Diagnosis: malignant neoplasm of brain, hemiplegia, unspecified affecting R dominant side, other muscle spasm  Primary Diagnosis Onset: 02/06/2021 '[x]'$ Progressive Disease Relevant Past and Future Surgeries: hx of radiation and chemotherapy Height: per chart 5'4" Weight: per chart 179 lbs Explain and recent changes or trends in weight:   Relevant History including falls: Pt is a pleasant 52 yo female, who is known to PT, being seen for impairments resulting from brain cancer and hemiplegia affecting R dominant side with muscle spasm, now here today for power wheelchair eval. Pt currently spends majority of day in manual wheelchair or recliner, and pt is unable to propel wheelchair independently and depends on her spouse for mobility and assistance with all ADLs. 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 Pt with affected R peripheral vision but can see fully out of L eye, pt also with impaired sensation on R side. Pt has recent hx of R lower leg pressure wound (still covered by bandage). Pt spouse reports additional hx of pressure wound that "never opened up" below pt's tailbone. She is incontinent of b/b with hx of accidents/not making it to commode  on time. This is exacerbated by immobility resulting in pt spending 18+ hours/day seated in WC or recliner. In PT, the pt has demonstrated ability to ambulate short distances with hemi-walker (LUE) and close CGA-min assist, as pt is a high fall risk and at times can suddenly become unsteady, dizzy, nauseated or significantly fatigued requiring quick assistance back into her wheelchair to prevent fall. Pt with history of 12 falls in the past year.  She has a cervical contracture in R lateral flexion and chronic pain in this region and throughout L shoulder, further limited her mobility. Pt reports pain comes and goes but is never below a 3/10. Pt's spouse helps her with all ADLs, including assisting with transfers. Prior to cancer pt was independent with ADLs. The pt lives in a house with her husband, and oldest daughter moved back in to help her as well. Everything is accessible to the pt on the first floor of her home. She would like a power wheelchair in order to improve her safety independence with ADLs.    HOME ENVIRONMENT: '[x]'$ House  '[]'$ Condo/town home  '[]'$ Apartment  '[]'$ Assisted Living    '[]'$ Lives Alone '[x]'$  Lives with Others:    husband                                               Hours with caregiver: husband is primary caregiver, and oldest daughter moved in to help   '[x]'$ Home is accessible to patient            Stairs  '[]'$ Yes '[]'$  No     Ramp '[x]'$ Yes '[]'$ No Comments:  Pt spouse reports has ramp at the front of house   COMMUNITY ADL: TRANSPORTATION: '[x]'$ Car   - SUV, spouse helps pt get in and out of vehicle  '[]'$ Van    '[]'$ Public Transportation    '[]'$ Adapted w/c Lift   '[]'$ Ambulance   '[]'$ Other:       '[]'$   Sits in wheelchair during transport  Employment/School:    pt disabled not working Specific requirements pertaining to mobility                                                     Other:                                       FUNCTIONAL/SENSORY PROCESSING SKILLS:  Handedness:   '[x]'$ Right     '[]'$ Left     '[]'$ NA  Comments:  Pt is able to use LUE, full ROM on L side is somewhat pain-limited; pt with increased tone throughout RUE, unable to use                              Functional Processing Skills for Wheeled Mobility '[x]'$ Processing Skills are adequate for safe wheelchair operation  Areas of concern than may interfere with safe operation of wheelchair Description of problem   '[]'$  Attention to environment     '[]'$ Judgment     '[]'$  Hearing  '[x]'$  Vision or visual processing    '[]'$ Motor Planning  '[]'$  Fluctuations in Behavior                                                R peripheral vision deficit, however pt reports WNL vision L eye   VERBAL COMMUNICATION: '[x]'$ WFL receptive '[]'$  WFL expressive - impaired, pt being seen by speech therapist for Broca's aphasia but pt can express herself with cues/increased time '[]'$ Understandable  '[]'$ Difficult to understand  '[]'$ non-communicative '[]'$  Uses an augmented communication device    CURRENT SEATING / MOBILITY: Current Mobility Base:   '[]'$ None  '[]'$ Dependent  '[x]'$ Manual - husband propels wheelchair for pt as pt with insufficient endurance, strength and mobility of LUE and pt unable to use RUE   '[]'$ Scooter  '[]'$ Power   Type of Control:                       Manufacturer:                         Size:                         Age:                           Current Condition of Mobility Base:  Current Wheelchair components:                                                                                                                                   Describe posture in present seating system:  sacral sitting, pelvic obliquity, increased thoracic kyphosis, neck with R lateral flexion contracture         (see below for full details)                                                                  SENSATION and SKIN  ISSUES: Sensation '[]'$ Intact '[x]'$ Impaired '[]'$ Absent   Level of sensation:  impaired R side, pt reports particularly throughout R ankle/foot                         Pressure Relief: Able to perform effective pressure relief : unable/insufficient ability due to decreased endurance/strength necessary to sustain pressure relief techniques, unable to  use R side to assist with pressure relief techniques '[]'$ Yes  '[x]'$  No Method:                                                                              If not, Why?:                                                                          Skin Issues/Skin Integrity Current Skin Issues   '[x]'$ Yes '[]'$ No  '[]'$ Intact '[]'$  Red area '[]'$  Open Area  '[]'$ Scar Tissue '[x]'$ At risk from prolonged sitting  Wound site on RLE still covered with bandage, pt reports it has been healing (over several months)               History of Skin Issues   '[x]'$ Yes '[]'$ No  Where     R posterior lower leg/calf                                    When - observed back in August 2023, still covered with bandage, spouse recently restarted treating/covering site again due to  regression with healing                                              Hx of skin flap surgeries '[]'$ Yes '[x]'$ No  Where                  When                                                  Limited sitting tolerance '[]'$ Yes '[x]'$ No Hours spent sitting in wheelchair  -however increases further pressure wound development risk/hospitalization due to insufficient ability to perform pressure relief techniques daily:       18+ hrs                                      Complaint of Pain:  Please describe:   Cervical region, shoulder pain (bilateral) and RLE pain. Pt has muscle spasms, increased tone, has future appt for botox                                                           Swelling/Edema: Pt reports she gets painful swelling in RLE      ADL STATUS (in reference to wheelchair use):  Indep Assist Unable Indep with Equip Not  assessed Comments *Patient requires assistance with ALL ADLS  Dressing               x                                                          Eating                x                                                                                                              Toileting                  x  Bathing              x                                                                                                                        Grooming/ Hygiene               x                                                                                                               Meal Prep                x                                                                                                          IADLS               x                                                                                                   Bowel Management: '[]'$ Continent  '[x]'$ Incontinent  '[x]'$ Accidents Comments: increased risk for further wound development/hospitalization                                                 Bladder Management: '[]'$ Continent  '[x]'$ Incontinent  '[x]'$ Accidents Comments:   increased risk for further wound development/hospitalization  WHEELCHAIR SKILLS: Manual w/c Propulsion: '[]'$ UE or LE strength and endurance sufficient to participate in ADLs using manual wheelchair  Pt does not have sufficient UE/LE strength and endurance to participate in ADLs using manual wheelchair Arm :  '[]'$ left '[]'$ right  '[]'$ Both                                   Foot:   '[]'$ left '[]'$ right  '[]'$ Both  Distance:   Operate Scooter: '[]'$  Strength, hand grip, balance and transfer appropriate for use '[]'$ Living environment is accessible for use of scooter Comments: A scooter would be unsafe for pt due to decreased seated balance against minimal perturbation, decreased strength,  fluctuating levels of fatigue, nausea, and dizziness  Operate Power w/c:  '[x]'$  Std. Joystick   '[]'$  Alternative Controls Indep '[x]'$  Assist '[]'$  Dependent/ Unable '[]'$  N/A '[]'$  '[]'$ Safe          '[]'$  Functional      Distance:                Bed confined without wheelchair '[x]'$  Yes Pt high fall risk, fluctuating level of endurance, can experience increased fatigue in addition to impaired balance, strength and mobility '[]'$  No   STRENGTH/RANGE OF MOTION:  Range of Motion Strength  Shoulder  Impaired throughout R side; L  limited to about 90-95 deg currently with abd/flex                                              - RUE: pt unable to use RUE  -LUE: 4+/5, somewhat pain-limited                                               Elbow     Impaired throughout R side; LUE WFL                                            - RUE: pt unable to use RUE  -LUE: 4+/5                                                             Wrist/Hand   Impaired throughout R side - contracture  wrist/hand; LUE WFL                                                            - RUE: pt unable to use RUE      -LUE: 4+/5  Hip    Impaired throughout R side; LUE slight pain-limitation with hip flexion,  -  RLE: 3-/5 limited by significant tone        -LLE: 4/5, somewhat pain-limited                                              Knee    Impaired throughout R side;  LLE WFL                                                        -RLE: 3-/5 limited by significant tone        -LLE: 4/5, somewhat pain-limited                                                       Ankle Impaired throughout R side;  LLE WFL  -RLE: unable to move ankle due to tone/spasticity and decreased strength    -LLE: 4/5                                                             MOBILITY/BALANCE:  '[x]'$  Patient is totally dependent for mobility                                                                                                Balance Transfers  Ambulation  Sitting Balance: Standing Balance: Can require up to mod assist if pt becomes unsteady  '[]'$  Independent '[]'$  Independent/Modified Independent  '[]'$  WFL     '[]'$  [facility] '[]'$  Supervision '[]'$  Supervision  '[x]'$  Uses UE for balance  '[x]'$  Supervision '[]'$  Min Assist '[x]'$  Ambulates with close CGA-min assist with hemiwalker short distances, but extremely high fall risk/unsafe without assist                        '[]'$  Min Assist '[x]'$  Min assist '[]'$  Mod Assist '[]'$  Ambulates with Device:  '[]'$  RW   '[]'$  StW   '[]'$  Cane   '[]'$                 '[]'$  Mod Assist '[x]'$  Mod assist '[x]'$  Max assistx2 today with testing at eval   '[]'$  Max Assist '[]'$  Max assist '[]'$  Dependent '[]'$  Indep. Short Distance Only  '[]'$  Unable '[]'$  Unable '[]'$  Lift / Sling Required Distance (in feet)                             '[]'$   Sliding board '[]'$  Unable to Ambulate: (Explain:  Cardio Status:  '[x]'$ Intact  '[]'$  Impaired   '[]'$  NA                              Respiratory Status:  '[x]'$ Intact   '[]'$ Impaired   '[]'$ NA                                     Orthotics/Prosthetics:  originally had RAFO, not currently wearing due to hx of wound R calf, still bandaged due to fluctuations in healing                                                                    Comments (Address manual vs power w/c vs scooter):    Pt with insufficient trunk strength, mobility due to R side hemiplegia with muscle spasm, and seated balance for safe use of a scooter. Pt also can become quickly fatigued, nauseated and dizzy, further preventing safe use of a scooter for mobility as it would place pt at an increased risk for falls from the scooter. The pt is unable to independently propel a manual wheelchair as she cannot use her R side to assist, and has insufficient strength, ROM (that is pain limited), and endurance required to propel a manual wheelchair with her LUE/LLE. A manual wheelchair and scooter are also inappropriate for the pt due to current pressure wound and hx of  developing pressure wounds. The pt is unable to perform pressure-relief techniques and a manual wheelchair and scooter would continue to increase her risk of pressure wound development and future hospitalization as a result. The pt requires a power chair due to these deficits and a power chair would also allow the pt to perform pressure relief, and change positioning to relieve pain due to tone/spasticity as well as assist in preventing further contracture development throughout R side.                                                 Anterior / Posterior Obliquity Rotation-Pelvis  PELVIS    '[]'$ Neutral  '[x]'$  Posterior  '[]'$  Anterior     '[]'$ WFL  '[]'$ Right Elevated  '[x]'$ Left Elevated   '[]'$ WFL  '[]'$ Right Anterior '[x]'$   Left Anterior    '[]'$  Fixed '[x]'$  Partly Flexible '[]'$  Flexible  '[]'$  Other  '[]'$  Fixed  '[x]'$  Partly Flexible  '[]'$  Flexible '[]'$  Other  '[]'$  Fixed  '[x]'$  Partly Flexible  '[]'$  Flexible '[]'$  Other  TRUNK '[]'$ WFL '[x]'$ Thoracic Kyphosis '[]'$ Lumbar Lordosis   '[]'$  WFL '[]'$ Convex Right '[x]'$ Convex Left   '[]'$ c-curve '[]'$ s-curve '[]'$ multiple  '[]'$  Neutral '[x]'$  Left-anterior '[]'$  Right-anterior    '[]'$  Fixed '[]'$  Flexible '[x]'$  Partly Flexible       Other  '[]'$  Fixed '[]'$  Flexible '[x]'$  Partly Flexible '[]'$  Other  '[]'$  Fixed           '[]'$  Flexible '[x]'$  Partly Flexible '[]'$  Other   Position Windswept   HIPS  '[]'$  Neutral '[]'$  Abduct '[x]'$  Adducted '[]'$  Neutral '[]'$  Right '[]'$   Left       '[]'$  Fixed  '[x]'$  Partly Flexible             '[]'$  Dislocated '[]'$  Flexible '[]'$  Subluxed    '[]'$  Fixed '[]'$  Partly Flexible  '[]'$  Flexible '[]'$  Other              Foot Positioning Knee Positioning   Knees and  Feet  '[]'$  WFL '[]'$ Left '[x]'$ Right - significant mobility limiation due to tone '[]'$  WFL '[x]'$ Left '[]'$ Right   KNEES ROM concerns: ROM concerns: Adducted at rest largely due to chair positioning/dimensions   & Dorsi-Flexed                    '[x]'$ Lt '[]'$ Rt                                  FEET Plantar Flexed                  '[]'$ Lt '[]'$ Rt     Inversion                     '[]'$ Lt '[]'$ Rt     Eversion                    '[]'$ Lt '[]'$ Rt    HEAD '[]'$  Functional '[]'$  Good Head Control   & '[x]'$  Flexed         '[]'$  Extended '[x]'$  Adequate Head Control **somewhat limited by tone with R lateral flexion contracture   NECK '[]'$  Rotated  Lt  '[]'$  Lat Flexed Lt '[]'$  Rotated  Rt '[x]'$  Lat Flexed Rt '[]'$  Limited Head Control    '[]'$  Cervical Hyperextension '[]'$  Absent  Head Control    SHOULDERS ELBOWS WRIST& HAND         Left     Right    Left     Right  U/E '[]'$ Functional  Left            '[]'$ Functional  Right                                 '[x]'$ Fisting             '[]'$ Fisting  **wearing hand brace due to flexion contracture/spasticity/tone of R hand   '[x]'$ elevated Left '[]'$ depressed  Left '[]'$ elevated Right '[]'$ depressed  Right      '[x]'$ protracted Left '[]'$ retracted Left '[x]'$ protracted Right '[]'$ retracted Right '[x]'$ subluxed  Left              '[]'$ subluxed  Right         Goals for Wheelchair Mobility  '[x]'$  Independence with mobility in the home with motor related ADLs (MRADLs)  '[x]'$  Independence with MRADLs in the community '[]'$  Provide dependent mobility  '[]'$  Provide recline     '[x]'$ Provide tilt   Goals for Seating system '[x]'$  Optimize pressure distribution '[x]'$  Provide support needed to facilitate function or safety '[x]'$  Provide corrective forces to assist with maintaining or improving posture '[]'$  Accommodate client's posture: current seated postures and positions are not flexible or will not tolerate corrective forces '[x]'$  Client to be independent with relieving pressure in the wheelchair '[x]'$ Enhance physiological function such as breathing, swallowing, digestion  Simulation ideas/Equipment trials:  State why other equipment was unsuccessful:             The pt is a pleasant 52 yo female referred today for power wheelchair evaluation with diagnosis of L frontal astrocytoma IDH mutant, 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 Pt with insufficient trunk strength, mobility due to R side hemiplegia with muscle spasm, and seated balance for safe use of a scooter. Pt also can become quickly fatigued, nauseated and dizzy, further preventing safe use of a scooter for mobility as it would place pt at an increased risk for falls from the scooter. Pt high fall risk with hx of 12 falls in the past year. The pt is unable to independently propel a manual wheelchair as she cannot use her R side to assist, and has insufficient strength, ROM (that is pain limited), and endurance required to propel a manual wheelchair with her LUE/LLE. A manual wheelchair and scooter are also inappropriate for the pt due to current pressure wound and hx of developing pressure wounds. The pt is unable to perform pressure-relief techniques and a manual wheelchair and scooter would continue to increase her risk of pressure wound development and future hospitalization as a result. The pt requires a power chair due to these deficits and a power chair would also allow the pt to perform pressure relief, and change positioning to relieve pain due to tone/spasticity as well as assist in preventing further contracture development throughout R side. The pt will additionally require the below selected features listed under mobility base recommendations to achieve these goals of decreasing pressure wound development with risk for hospitalization, falls, pain, and increasing independence and safety with ADLs. Please refer to the section below for full details.                                                                 MOBILITY BASE RECOMMENDATIONS and JUSTIFICATION: MOBILITY COMPONENT JUSTIFICATION  Manufacturer: [employer]          Model:   Stretto           Size: Width 17"       Seat Depth         19"    '[x]'$ provide transport from point A to B '[x]'$ promote Indep mobility  '[x]'$ is not a safe, functional ambulator '[x]'$ walker or cane  inadequate '[x]'$ non-standard width/depth necessary to accommodate anatomical measurement '[]'$                             '[]'$ Manual Mobility Base '[]'$ non-functional ambulator    '[]'$ Scooter/POV  '[]'$ can safely operate  '[]'$ can safely transfer   '[]'$ has adequate trunk stability  '[]'$ cannot functionally propel manual w/c  '[x]'$ Power Mobility Base  '[x]'$ non-ambulatory  '[x]'$ cannot functionally propel manual wheelchair  '[x]'$  cannot functionally and safely operate scooter/POV '[x]'$ can safely operate and willing to  '[]'$ Stroller Base '[]'$ infant/child  '[]'$ unable to propel manual wheelchair '[]'$ allows for growth '[]'$ non-functional ambulator '[]'$ non-functional UE '[]'$ Indep mobility is not a goal at this time  '[x]'$ Tilt  '[]'$ Forward                   '[x]'$ Backward                  '[x]'$ Powered tilt              '[]'$   Manual tilt  '[x]'$ change position against gravitational force on head and shoulders  '[x]'$ change position for pressure relief/cannot weight shift '[x]'$ transfers  '[x]'$ management of tone '[x]'$ rest periods '[x]'$ control edema '[x]'$ facilitate postural control  '[]'$                                       '[]'$ Recline  '[]'$ Power recline on power base '[]'$ Manual recline on manual base  '[]'$ accommodate femur to back angle  '[]'$ bring to full recline for ADL care  '[]'$ change position for pressure relief/cannot weight shift '[]'$ rest periods '[]'$ repositioning for transfers or clothing/diaper /catheter changes '[]'$ head positioning  '[]'$ Lighter weight required '[]'$ self- propulsion  '[]'$ lifting '[]'$                                                 '[]'$ Heavy Duty required '[]'$ user weight greater than 250# '[]'$ extreme tone/ over active movement '[]'$ broken frame on previous chair '[]'$                                     '[x]'$  Back Acta Relief  '[]'$  Angle Adjustable '[]'$  Custom molded                           '[x]'$ postural control '[x]'$ control of tone/spasticity '[x]'$ accommodation of range of motion '[x]'$ UE functional control '[]'$ accommodation for seating system '[]'$                                           '[x]'$ provide lateral trunk support '[x]'$ accommodate deformity '[x]'$ provide posterior trunk support '[x]'$ provide lumbar/sacral support '[x]'$ support trunk in midline '[x]'$ Pressure relief over spinal processes  '[x]'$  Seat Cushion - Solution Skin protection and positioning                        '[x]'$ impaired sensation  '[x]'$ decubitus ulcers present '[x]'$ history of pressure ulceration '[x]'$ prevent pelvic extension '[]'$ low maintenance  '[x]'$ stabilize pelvis  '[x]'$ accommodate obliquity '[x]'$ accommodate multiple deformity '[x]'$ neutralize lower extremity position '[x]'$ increase pressure distribution '[]'$                                           '[x]'$  Pelvic/thigh support  '[x]'$  Lateral thigh guide '[]'$  Distal medial pad  '[]'$  Distal lateral pad '[x]'$  pelvis in neutral '[x]'$ accommodate pelvis '[x]'$  position upper legs '[x]'$  alignment '[]'$  accommodate ROM '[]'$  decrease adduction '[x]'$ accommodate tone '[x]'$ removable for transfers '[]'$ decrease abduction  '[]'$  Lateral trunk Supports '[]'$  Lt     '[]'$  Rt '[]'$ decrease lateral trunk leaning '[]'$ control tone '[]'$ contour for increased contact '[]'$ safety  '[]'$ accommodate asymmetry '[]'$                                                 '[x]'$  Mounting hardware  '[]'$ lateral trunk supports  '[]'$ back   '[]'$ seat '[x]'$ headrest      '[x]'$  thigh support '[]'$ fixed   '[x]'$ swing away '[]'$ attach seat platform/cushion to w/c frame '[]'$ attach  back cushion to w/c frame '[x]'$ mount postural supports '[x]'$ mount headrest  '[]'$ swing medial thigh support away '[]'$ swing lateral supports away for transfers  '[]'$                                                     Armrests  '[]'$ fixed '[x]'$ adjustable height '[]'$ removable   '[]'$ swing away  '[x]'$ flip back   '[]'$ reclining '[x]'$ full length pads '[]'$ desk    '[]'$ pads tubular  '[x]'$ provide support with elbow at 90   '[x]'$ provide support for w/c tray '[]'$ change of height/angles for variable activities '[x]'$ remove for transfers '[]'$ allow to come closer to table top '[]'$ remove for access to tables '[]'$                                               Hangers/  Leg rests  '[]'$ 60 '[]'$ 70 '[]'$ 90 '[]'$ elevating '[]'$ heavy duty  '[]'$ articulating '[]'$ fixed '[]'$ lift off '[]'$ swing away     '[]'$ power '[]'$ provide LE support  '[]'$ accommodate to hamstring tightness '[]'$ elevate legs during recline   '[]'$ provide change in position for Legs '[]'$ Maintain placement of feet on footplate '[]'$ durability '[]'$ enable transfers '[]'$ decrease edema '[]'$ Accommodate lower leg length '[]'$                                         Foot support Footplate    '[]'$ Lt  '[]'$  Rt  '[x]'$  Center mount '[x]'$ flip up                            '[x]'$ depth/angle adjustable '[]'$ Amputee adapter    '[]'$  Lt     '[]'$  Rt '[]'$ provide foot support '[x]'$ accommodate to ankle ROM '[x]'$ transfers '[]'$ Provide support for residual extremity '[]'$  allow foot to go under wheelchair base '[]'$  decrease tone  '[]'$                                                 '[]'$  Ankle strap/heel loops '[]'$ support foot on foot support '[]'$ decrease extraneous movement '[]'$ provide input to heel  '[]'$ protect foot  Tires: '[]'$ pneumatic  '[x]'$ flat free inserts  '[]'$ solid  '[x]'$ decrease maintenance  '[x]'$ prevent frequent flats '[]'$ increase shock absorbency '[]'$ decrease pain from road shock '[]'$ decrease spasms from road shock '[]'$                                              '[x]'$  Headrest  '[x]'$ provide posterior head support '[x]'$ provide posterior neck support '[x]'$ provide lateral head support '[]'$ provide anterior head support '[x]'$ support during tilt and recline '[]'$ improve feeding   '[]'$ improve respiration '[]'$ placement of switches '[x]'$ safety  '[]'$ accommodate ROM  '[]'$ accommodate tone '[]'$ improve visual orientation  '[]'$  Anterior chest strap '[]'$  Vest '[]'$  Shoulder retractors  '[]'$ decrease forward movement of shoulder '[]'$ accommodation of TLSO '[]'$ decrease forward movement of trunk '[]'$ decrease shoulder elevation '[]'$ added abdominal support '[]'$ alignment '[]'$ assistance with shoulder control  '[]'$   Pelvic Positioner '[x]'$ Belt '[]'$ SubASIS bar '[]'$ Dual Pull '[x]'$ stabilize tone '[x]'$ decrease falling out of chair/  **will not Decrease potential for sliding due to pelvic tilting '[]'$ prevent excessive rotation '[]'$ pad for protection over boney prominence '[]'$ prominence comfort '[]'$ special pull angle to control rotation '[]'$                                                  Upper ExtremitySupport  '[]'$ L   '[x]'$  R '[]'$ Arm trough   '[]'$ hand support '[x]'$  tray       '[]'$ full tray '[]'$ swivel mount '[x]'$ decrease edema      '[x]'$ decrease subluxation   '[x]'$ control tone   '[]'$ placement for AAC/Computer/EADL '[x]'$ decrease gravitational pull on shoulders '[]'$ provide midline positioning '[]'$ provide support to increase UE function '[]'$ provide hand support in natural position '[]'$ provide work surface   POWER WHEELCHAIR CONTROLS  '[x]'$ Proportional  '[]'$ Non-Proportional Type                                      '[x]'$ Left  '[]'$ Right '[x]'$ provides access for controlling wheelchair   '[]'$ lacks motor control to operate proportional drive control '[]'$ unable to understand proportional controls  Actuator Control Module  '[x]'$ Single  '[]'$ Multiple   '[x]'$ Allow the client to operate the power seat function(s) through the joystick control   '[]'$ Safety Reset Switches '[]'$ Used to change modes and stop the wheelchair when driving in latch mode    '[]'$ Upgraded Electronics   '[]'$ programming for accurate control '[]'$ progressive Disease/changing condition '[]'$ non-proportional drive control needed '[]'$ Needed in order to operate power seat functions through joystick control   '[]'$ Display box '[]'$ Allows user to see in which mode and drive the wheelchair is set  '[]'$ necessary for alternate controls    '[]'$ Digital interface electronics '[]'$ Allows w/c to operate when using alternative drive controls  '[]'$ ASL Head Array '[]'$ Allows client to operate wheelchair  through switches placed in tri-panel headrest  '[]'$ Sip and puff with tubing kit '[]'$ needed to operate sip and puff drive controls  '[]'$ Upgraded tracking electronics '[]'$ increase safety when driving '[]'$ correct tracking when on uneven surfaces  '[x]'$ Mount for switches or  joystick '[]'$ Attaches switches to w/c  '[x]'$ Swing away for access or transfers '[]'$ midline for optimal placement '[]'$ provides for consistent access  '[]'$ Attendant controlled joystick plus mount '[]'$ safety '[]'$ long distance driving '[]'$ operation of seat functions '[]'$ compliance with transportation regulations '[]'$                                             Rear wheel placement/Axle adjustability '[]'$ None '[]'$ semi adjustable '[]'$ fully adjustable  '[]'$ improved UE access to wheels '[]'$ improved stability '[]'$ changing angle in space for improvement of postural stability '[]'$ 1-arm drive access '[]'$ amputee pad placement '[]'$                                Wheel rims/ hand rims  '[]'$ metal   '[]'$ plastic coated '[]'$ oblique projections           '[]'$ vertical projections '[]'$ Provide ability to propel manual wheelchair  '[]'$  Increase self-propulsion with hand weakness/decreased grasp  Push handles '[]'$ extended   '[]'$ angle adjustable              '[]'$ standard '[]'$ caregiver access '[]'$ caregiver assist '[]'$ allows "hooking" to enable increased  ability to perform ADLs or maintain balance  One armed device   '[]'$ Lt   '[]'$ Rt '[]'$ enable propulsion of manual wheelchair with one arm   '[]'$                                            Brake/wheel lock extension '[]'$  Lt   '[]'$  Rt '[]'$ increase indep in applying wheel locks   '[]'$ Side guards '[]'$ prevent clothing getting caught in wheel or becoming soiled '[]'$  prevent skin tears/abrasions  Battery:     NF 22x2                                       '[x]'$ to power wheelchair                                                         Other:                                                                                                                        The above equipment has a life- long use expectancy. Growth and changes in medical and/or functional conditions would be the exceptions. 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 The therapist will not receive remuneration of any  kind for the equipment recommended in this evaluation.   Patient has mobility limitation that significantly impairs safe, timely participation in one or more mobility related ADL's. (bathing, toileting, feeding, dressing, grooming, moving from room to room)  '[x]'$  Yes '[]'$  No  Will mobility device sufficiently improve ability to participate and/or be aided in participation of MRADL's?      '[x]'$  Yes '[]'$  No  Can limitation be compensated for with use of a cane or walker?                                    '[]'$  Yes '[x]'$  No  Does patient or caregiver demonstrate ability/potential ability & willingness to safely use the mobility device?    '[x]'$  Yes '[]'$  No  Does patient's home environment support use of recommended mobility device?            '[x]'$  Yes '[]'$  No  Does patient have sufficient upper extremity function necessary to functionally propel a manual wheelchair?     '[]'$  Yes '[x]'$  No  Does patient have sufficient strength and trunk stability to safely operate a POV (scooter)?                                  '[]'$   Yes '[x]'$  No  Does patient need additional features/benefits provided by a power wheelchair for MRADL's in the home?        '[x]'$  Yes '[]'$  No  Does the patient demonstrate the ability to safely use a power wheelchair?                   '[x]'$  Yes '[]'$  No     Physician's Name Printed:                                                        44 Signature:  Date:     This is to certify that I, the above signed therapist have the following affiliations: '[]'$  This DME provider '[]'$  Manufacturer of recommended equipment '[]'$  Patient's long term care facility '[x]'$  None of the above  Therapist Name/Signature:  Ricard Dillon PT, DPT                                           Date: 03/11/2022  GOALS: Target date:  03/10/2022     Pt and caregivers will understand PT recommendation and appropriate/safe use for wheelchair and seating for home use. Baseline: Evaluation completed, PT recommendations provided and pt  and spouse confirm understanding, agreeable to plan. Goal status: MET   PLAN: Stability/clinical decision making: evolving/moderate complexity Clinical decision making: high   PT FREQUENCY: one time visit  PT DURATION: other: 03/10/2022  PLANNED INTERVENTIONS: Therapeutic activity and Patient/Family education.  PLAN FOR NEXT SESSION: Power wheelchair evaluation only (1 visit)   Zollie Pee, PT 03/11/2022, 3:55 PM

## 2022-03-10 ENCOUNTER — Ambulatory Visit: Payer: Medicare Other

## 2022-03-10 ENCOUNTER — Ambulatory Visit: Payer: Medicare Other | Attending: Adult Health Nurse Practitioner

## 2022-03-10 ENCOUNTER — Encounter: Payer: Medicare Other | Admitting: Speech Pathology

## 2022-03-10 DIAGNOSIS — R262 Difficulty in walking, not elsewhere classified: Secondary | ICD-10-CM

## 2022-03-10 DIAGNOSIS — M6281 Muscle weakness (generalized): Secondary | ICD-10-CM

## 2022-03-10 DIAGNOSIS — C719 Malignant neoplasm of brain, unspecified: Secondary | ICD-10-CM | POA: Insufficient documentation

## 2022-03-10 DIAGNOSIS — R2681 Unsteadiness on feet: Secondary | ICD-10-CM | POA: Insufficient documentation

## 2022-03-10 DIAGNOSIS — M542 Cervicalgia: Secondary | ICD-10-CM | POA: Insufficient documentation

## 2022-03-10 DIAGNOSIS — M25661 Stiffness of right knee, not elsewhere classified: Secondary | ICD-10-CM | POA: Diagnosis present

## 2022-03-10 DIAGNOSIS — R278 Other lack of coordination: Secondary | ICD-10-CM | POA: Insufficient documentation

## 2022-03-11 NOTE — Therapy (Signed)
OUTPATIENT OCCUPATIONAL NEURO TREATMENT NOTE  Patient Name: Kelly Rollins MRN: [id number] DOB:[birth year], 52 y.o., female Today's Date: 03/11/2022  PCP: [name], PA-C REFERRING PROVIDER: [name], Corrine Jeani Hawking, PA-C   OT End of Session - 03/11/22 [phone]     Visit Number 31    Number of Visits 48    Date for OT Re-Evaluation 04/28/22    Authorization Time Period Reporting period beginning 03/03/22    Progress Note Due on Visit 10    OT Start Time 1515    OT Stop Time 1600    OT Time Calculation (min) 45 min    Equipment Utilized During Treatment wc    Activity Tolerance Patient tolerated treatment well    Behavior During Therapy [facility] for tasks assessed/performed            Past Medical History:  Diagnosis Date   ABDOMINAL PAIN OTHER SPECIFIED SITE 02/21/2009   Qualifier: Diagnosis of  By: Deborra Medina MD, Talia     ABDOMINAL PAIN-LUQ 04/17/2009   Qualifier: Diagnosis of  By: Fuller Plan MD Lamont Snowball T    Anal fissure    Anginal pain ([place])    Asthma    Depression    Diabetes mellitus without complication ([place])    Dyspnea    Dysrhythmia    Headache(784.0)    HEMATOCHEZIA 04/17/2009   Qualifier: Diagnosis of  By: Fuller Plan MD Lamont Snowball T    Hyperlipidemia    Hypertension    IBS (irritable bowel syndrome)    Migraine headache 11/06/2008   Qualifier: Diagnosis of  By: Deborra Medina MD, Talia     OVARIAN CYST, RIGHT 02/21/2009   Qualifier: Diagnosis of  By: Deborra Medina MD, Talia     PALPITATIONS, OCCASIONAL 04/17/2010   Qualifier: Diagnosis of  By: Deborra Medina MD, Talia     Pancreatitis 10/14/2017   Past Surgical History:  Procedure Laterality Date   BREAST EXCISIONAL BIOPSY Left 1997   ? left side, done at time of reduction , benign   BREAST REDUCTION SURGERY     bilateral   CESAREAN SECTION     CHOLECYSTECTOMY     COLONOSCOPY     ESOPHAGOGASTRODUODENOSCOPY     ESOPHAGOGASTRODUODENOSCOPY (EGD) WITH PROPOFOL N/A 10/21/2020   Procedure: ESOPHAGOGASTRODUODENOSCOPY (EGD) WITH  PROPOFOL;  Surgeon: Lesly Rubenstein, MD;  Location: [facility] ENDOSCOPY;  Service: Endoscopy;  Laterality: N/A;   REDUCTION MAMMAPLASTY Bilateral 1997   VAGINAL HYSTERECTOMY     Patient Active Problem List   Diagnosis Date Noted   Abnormal MRI, lumbar spine (01/29/2020) 02/19/2020   Anxiety due to MRI 01/25/2020   Claustrophobia associated to MRI 01/25/2020   Enthesopathy of knee region (Right) 01/25/2020   Enthesopathy of knee region (Left) 01/25/2020   Lumbar facet hypertrophy 01/25/2020   Lumbosacral radiculopathy at S1 (Left) 01/17/2020   Lumbosacral radiculopathy (S1) (Left) 01/17/2020   Chronic lower extremity pain (Bilateral) (L>R) 01/17/2020   History of pancreatitis 07/18/2019   Obstructive sleep apnea syndrome 01/02/2019   Lumbar facet arthropathy (Multilevel) (Bilateral) 12/14/2018   Osteoarthritis involving multiple joints 11/28/2018   Chronic hip pain (Left) 11/16/2018   Acute hip pain (Left) 11/16/2018   DDD (degenerative disc disease), lumbosacral 11/16/2018   Chronic hip pain (Secondary source of pain) (Bilateral) (L>R) 05/19/2017   Elevated C-reactive protein (CRP) 05/19/2017   Elevated sed rate 05/19/2017   Spondylosis without myelopathy or radiculopathy, lumbosacral region 05/19/2017   Pharmacologic therapy 05/06/2017   Disorder of skeletal system 05/06/2017   Problems influencing health status  05/06/2017   Chest pain with moderate risk of acute coronary syndrome 04/21/2017   DOE (dyspnea on exertion) 04/21/2017   Intermittent palpitations 04/21/2017   Vitamin D insufficiency 05/04/2016   Depression 02/04/2016   Fatty liver 02/04/2016   Hypertension associated with diabetes ([place]) 02/04/2016   Chronic pain syndrome 01/15/2016   Acute low back pain 05/28/2015   Lumbar transverse process fracture (HCC) (Left L1) (03/11/15) 05/28/2015   Long term current use of opiate analgesic 05/01/2015   Long term prescription opiate use 05/01/2015   Opiate use (25 MME/Day)  05/01/2015   Encounter for therapeutic drug level monitoring 05/01/2015   Encounter for pain management planning 05/01/2015   Chronic low back pain (Primary Source of Pain) (Bilateral) (L>R) 05/01/2015   Lumbar facet syndrome (Bilateral) (L>R) 05/01/2015   Chronic knee pain [facility] source of pain) (Bilateral) (L>R) 05/01/2015   Lumbar spondylosis 05/01/2015   Neuropathic pain 05/01/2015   Neurogenic pain 05/01/2015   Myofascial pain 05/01/2015   Essential hypertriglyceridemia 04/26/2014   Type 2 diabetes mellitus with hyperglycemia, with long-term current use of insulin ([employer]) 04/26/2014   Anxiety 05/15/2010   Obesity 05/15/2010   Hyperlipidemia 04/03/2010   Fatty liver disease 04/02/2010   Pure hypercholesterolemia 03/19/2010   GERD 04/17/2009   Constipation 04/17/2009   TOBACCO ABUSE 11/06/2008    ONSET DATE: 02/06/2021  REFERRING DIAG: Astrocytoma  THERAPY DIAG:  Muscle weakness (generalized)  Other lack of coordination  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HCC)  Rationale for Evaluation and Treatment Rehabilitation  SUBJECTIVE:     SUBJECTIVE STATEMENT: Pt reports that her husband has had a hard time all week trying to open her hand.  Pt accompanied by: significant other  PERTINENT HISTORY:  Pt. Is a 52 y.o. 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 PMHx includes: Anorexia, dehydration, Adult Failure to thrive, contracture of muscles multiple sites. Pt./caregiver reports recently being hospitalized with a UTI, and dehydration.   PRECAUTIONS: Fall  WEIGHT BEARING RESTRICTIONS No  PAIN:  Are you having pain?  Pt. reports 6/10 pain in the entire R side.   FALLS: Has patient fallen in last 6 months? Yes. Number of falls Multiple Falls  LIVING ENVIRONMENT: Lives with: Family Lives in: House/apartment Stairs: Yes: External: 4 steps; bilateral but cannot reach both Has following equipment  at home: Single point cane, Walker - 4 wheeled, Hemi walker, shower chair, bed side commode, and Grab bars Lift chair in the tub, Minor,  PLOF: Independent  PATIENT GOALS: To improve the right UE  OBJECTIVE:   HAND DOMINANCE: Right  ADLs: Transfers/ambulation related to ADLs: Eating: MaxA with cutting food, setting up, Modified I with nondominant Left hand, Silicon Mat  Grooming: Independent  UB Dressing: MaxA  LB Dressing: MaxA Toileting: Uses a BSCommode mInA transfers, MaxA clothing negotiation Bathing: MaxA Tub Shower transfers:  Uses a shower lift chair Equipment:  w/c, BSCommode, cane   IADLs: Shopping: Husband performs Light housekeeping: Max Meal Prep: MaxA Community mobility: Relies on family Medication management:Husband assists Doctor, hospital: Performs together Handwriting: Not legible  MOBILITY STATUS: Hx of falls   ACTIVITY TOLERANCE: Activity tolerance: limited  FUNCTIONAL OUTCOME MEASURES: FOTO: 11.76  UPPER EXTREMITY ROM  : WFL Left AROM/PROM   Passive ROM Right eval Right  08/18/21 Right 09/08/2021 Right 10/30/2021 Right 02/03/22 Right 03/03/22  Shoulder flexion 30   48 15 55 65  Shoulder abduction 40 62 53 45 55 60  Shoulder adduction           Shoulder extension           Shoulder internal rotation           Shoulder external rotation           Elbow flexion 120   110 122 140   Elbow extension -67   -46 -72 -30   Wrist flexion Neutral   Neutral neutral 60   Wrist extension 12   40 25 28   Wrist ulnar deviation           Wrist radial deviation           Wrist pronation           Wrist supination           (Blank rows = not tested)     UPPER EXTREMITY MMT:      MMT Right eval Right 02/03/22 Left eval  Shoulder flexion 0/5 0/5 4+/5  Shoulder abduction 0/5 0/5 4+/5  Shoulder adduction       Shoulder extension       Shoulder internal rotation       Shoulder external rotation       Middle trapezius       Lower trapezius        Elbow flexion 0/5 0/5 4+/5  Elbow extension 0/5 0/5 4+/5  Wrist flexion       Wrist extension 0/5 0/5 4+/5  Wrist ulnar deviation       Wrist radial deviation       Wrist pronation       Wrist supination       (Blank rows = not tested)   FOTO score: 09/08/2021: 11   FOTO score 02/03/22: 11.76  SENSATION: Light touch: WFL  EDEMA:   Wrist: 18.5 cm MCP: 21.5 cm  10/30/2021:  Wrist: 17.5 cm MCPs: 20cm  02/03/22:  Wrist: 17.3 cm MCP: 20.0 cm   MUSCLE TONE: RUE: Severe  COGNITION: Overall cognitive status: Impaired  VISION: Subjective report: Wears glasses Baseline vision: Wears glasses all the time Pt. Reports no changes   PRAXIS: Impaired: Motor planning  TODAY'S TREATMENT:  Moist heat applied to R shoulder and hand x5 min in prep for therapeutic exercise, for pain management and muscle relaxation, and applied intermittently throughout session during rest breaks.   Therapeutic Exercise: Performed passive stretching throughout the RUE, working to reduce tone, increase muscle relaxation and maximize ROM to ease self care tasks.  Performed passive R scapular depression, scapular abd/add with manual gliding of scapula. Performed R passive shoulder flex, abd with gentle slide on lap pillow.  Performed passive ER/IR with slight humeral abd, elbow flex/ext, forearm pron/sup, wrist flex/ext, and digit flex/ext/abd.  Performed slow and gradual progression of prolonged shoulder flexion resting arm with extended elbow on pillows in pt's lap, building height gradually with rolled towel and hot pack.  Reviewed strategies for tone reduction at home, including use of heat, working proximally to distally with slow/gentle stretching, and facilitating passive wrist flexion for promoting easier digit extension.  Spouse/pt verbalized understanding.    PATIENT EDUCATION: Education details: RUE ROM, tone reduction strategies.  Person educated: Patient and Spouse Education method: explanation,  vc Education comprehension: verbalized/demonstrated understanding, additional educ needed   HOME EXERCISE PROGRAM:   Pt./caregiver education about positioning of the RUE, ROM  GOALS: Goals reviewed with patient? Yes  SHORT TERM GOALS: Target date: 03/17/22 Pt./caregivers will demonstrate independence with HEPs for  UE ROM, and edema control Baseline: 03/03/22: Ongoing; initiated prolonged stretch for R shoulder flexion with pillows in lap, arm resting on top.  Pt to try this at home as she responded well to this.  02/03/22: Caregiver assist for RUE positioning to reduce edema.  Improved from initial eval.  Additional training needed for caregiver PROM throughout the RUE.  10/30/2021: Pt. Edema has improved. Pt. Requires caregiver assist for positioning. 09/08/2021: Pt. Requires assist for positioning, ROM, edema control. Eval: No current HEPs  Goal status: Ongoing  LONG TERM GOALS: Target date: 04/28/22 Pt. Will improve right hand edema by 1 cm at the wrist, and MCPs Baseline: 03/03/22: wrist 17.5 cm, MCPs: 19.6 cm 02/03/22: wrist 17.3 cm, MCPs: 20 cm; 10/30/2021:  wrist: 17.5 cm, MCPs: 20 cm 09/08/2021: Wrist: 17.5 cm, MCPs: 20.5 cm Eval: Wrist: 18.5cm, MCPs: 21.5cm Goal status: Ongoing  2.  Pt. Will improve right shoulder PROM to 90 degrees for improved tolerance to UB ADLs. Baseline: 03/03/22: flexion 65, abd 60; 02/03/22: shoulder flexion 55, abd 55; 10/30/2021: shoulder flexion: 15, abduction: 45 09/08/2021: Right shoulder flexion: 48, abduction: 53 Eval: PROM shoulder flexion: 30 degrees, Abduction: 40 degrees Goal status: Ongoing/ revised  3.  Pt. Will improve right elbow PROM by 10 degrees to prepare for hand to mouth patterns Baseline: 02/03/22: elbow flex 140, ext -30; 10/30/2021: elbow extension -72, flexion: 122 09/08/2021: elbow extension: -46, flexion: 110 Eval:   elbow extension -67, flexion 120 Goal status: achieved  4.  Pt. Will improve PROM right wrist extension to 45 degrees or better to  enable WB through the hand during transfers and ADLs. Baseline: 02/03/22: wrist ext 28; 10/30/2021: wrist extension 25 09/08/2021: Wrist extension: 40 Eval: wrist extension 12 degrees, flexion to neutral Goal status: Ongoing/ revised  5.  Pt. Will tolerate full digit extension in the right hand to promote good skin integrity through the palmar surface of the right hand  Baseline: 03/03/22: Pt tolerated ~80% of full passive digit extension range; 02/03/22: Pt tolerates ROM through ~75% of passive digit extension range; 10/30/2021: Pt. Tolerates ROM through 50% of  digit extension range, occasionally 75% 09/08/2021: Pt. Is able to tolerate PROM through 50% of digit extension in the digits. Eval: Pt. Maintains digits in tight flexion Goal status: Ongoing  6.  Pt. Will require minA UE dressing using one armed dressing techniques. Baseline: 03/03/22: max A pull over shirt; 02/03/22: max A pull over shirt; 10/30/2021: Independent with button down shirt/sweater/cardigan, MaxA pullover shirt.09/08/2021: MaxA Eval: MaxA Goal status: Ongoing   7. Pt will tolerate 15 min or more of supine lying for caregiver to perform PROM throughout the RUE and for benefits of pressure relief and trunk extension.    Baseline: 03/03/22: Pt tolerated 3 nights supine in a bed while vacationing at the beach this month, but has not attempted supine in the home since that trip.  Spouse reported it took about 45 min to get pt comfortably positioned.  02/03/22: pt has not yet attempted at home but was able to tolerate 10 min in supine on mat in clinic.  Goal status: ongoing  ASSESSMENT:  CLINICAL IMPRESSION: Pt reports that her husband has had a hard time all week trying to open her hand.  Reviewed strategies for tone reduction at home, including use of heat, working proximally to distally with slow/gentle stretching, and facilitating passive wrist flexion for promoting easier digit extension.  Spouse/pt verbalized understanding.  Using these  strategies, OT was able to open hand  to achieve ~80-90% of full passive digit extension with ~10-20* of wrist flexion.  Need to increase wrist extension with simultaneous digit extension to work towards use of resting hand splint.  Pt able to relax into passive ER stretch at neutral ER this date, and pt pleased with level of comfort this brought her.  Reviewed this stretch with spouse for carry over at home (also in pt's handout).  Pain remains moderate to severe throughout the R side when being repositioned in wc, when arm is repositioned during an ADL, or during stretching.  Will continue to benefit from OT to work towards improving pain, ROM, and to increase engagement of the RUE during ADLs, and IADLs.   PERFORMANCE DEFICITS in functional skills including ADLs, IADLs, coordination, proprioception, sensation, edema, tone, ROM, strength, pain, FMC, GMC, balance, decreased knowledge of use of DME, skin integrity, and UE functional use, cognitive skills including attention, memory, and safety awareness, and psychosocial skills including coping strategies.   IMPAIRMENTS are limiting patient from ADLs, IADLs, education, work, leisure, and social participation.   COMORBIDITIES may have co-morbidities  that affects occupational performance. Patient will benefit from skilled OT to address above impairments and improve overall function.  MODIFICATION OR ASSISTANCE TO COMPLETE EVALUATION: Maximum modification of tasks or assist with assess necessary to complete an evaluation.  OT OCCUPATIONAL PROFILE AND HISTORY: Detailed assessment: Review of records and additional review of physical, cognitive, psychosocial history related to current functional performance.  CLINICAL DECISION MAKING: High - multiple treatment options, significant modification of task necessary  REHAB POTENTIAL: Good for stated goals  EVALUATION COMPLEXITY: High    PLAN: OT FREQUENCY: 1-2x/week  OT DURATION: 12 weeks  PLANNED  INTERVENTIONS: self care/ADL training, therapeutic exercise, therapeutic activity, neuromuscular re-education, manual therapy, passive range of motion, paraffin, moist heat, contrast bath, patient/family education, cognitive remediation/compensation, energy conservation, and coping strategies training  RECOMMENDED OTHER SERVICES: PT, and ST  CONSULTED AND AGREED WITH PLAN OF CARE: Patient and family member/caregiver  PLAN FOR NEXT SESSION: Initiate PROM, and RUE Edema control techniques   Leta Speller, MS, OTR/L  Darleene Cleaver, OT 03/11/2022, 8:37 AM

## 2022-03-12 ENCOUNTER — Ambulatory Visit: Payer: Medicare Other

## 2022-03-12 ENCOUNTER — Encounter: Payer: Medicare Other | Admitting: Speech Pathology

## 2022-03-17 ENCOUNTER — Ambulatory Visit: Payer: Medicare Other

## 2022-03-17 ENCOUNTER — Encounter: Payer: Medicare Other | Admitting: Speech Pathology

## 2022-03-19 ENCOUNTER — Ambulatory Visit: Payer: Medicare Other

## 2022-03-24 ENCOUNTER — Ambulatory Visit: Payer: Medicare Other

## 2022-03-24 DIAGNOSIS — C719 Malignant neoplasm of brain, unspecified: Secondary | ICD-10-CM

## 2022-03-24 DIAGNOSIS — R278 Other lack of coordination: Secondary | ICD-10-CM

## 2022-03-24 DIAGNOSIS — M6281 Muscle weakness (generalized): Secondary | ICD-10-CM | POA: Diagnosis not present

## 2022-03-24 DIAGNOSIS — M25661 Stiffness of right knee, not elsewhere classified: Secondary | ICD-10-CM

## 2022-03-24 DIAGNOSIS — R2681 Unsteadiness on feet: Secondary | ICD-10-CM

## 2022-03-24 NOTE — Therapy (Addendum)
OUTPATIENT PHYSICAL THERAPY NEURO TREATMENT/RECERT     Patient Name: Kelly Rollins MRN: [id number] DOB:[birth year], 52 y.o., female Today's Date: 04/07/2022   PCP: Kelly Rossetti, PA-C REFERRING PROVIDER: Elnora Rollins, Kelly Public, PA-C   PT End of Session - 04/07/22 [phone]     Visit Number 21    Number of Visits 45    Date for PT Re-Evaluation 06/04/22   corrected/addended for recert   Authorization Type [employer]    Authorization Time Period 09/16/21-12/09/21    PT Start Time 1349    PT Stop Time 1428    PT Time Calculation (min) 39 min    Equipment Utilized During Treatment Gait belt    Activity Tolerance Patient tolerated treatment well;Patient limited by fatigue    Behavior During Therapy [facility] for tasks assessed/performed                        Past Medical History:  Diagnosis Date   ABDOMINAL PAIN OTHER SPECIFIED SITE 02/21/2009   Qualifier: Diagnosis of  By: Kelly Medina MD, Kelly Rollins     ABDOMINAL PAIN-LUQ 04/17/2009   Qualifier: Diagnosis of  By: Kelly Plan MD Kelly Rollins    Anal fissure    Anginal pain ([place])    Asthma    Depression    Diabetes mellitus without complication ([place])    Dyspnea    Dysrhythmia    Headache(784.0)    HEMATOCHEZIA 04/17/2009   Qualifier: Diagnosis of  By: Kelly Plan MD Kelly Rollins    Hyperlipidemia    Hypertension    IBS (irritable bowel syndrome)    Migraine headache 11/06/2008   Qualifier: Diagnosis of  By: Kelly Medina MD, Kelly Rollins     OVARIAN CYST, RIGHT 02/21/2009   Qualifier: Diagnosis of  By: Kelly Medina MD, Kelly Rollins     PALPITATIONS, OCCASIONAL 04/17/2010   Qualifier: Diagnosis of  By: Kelly Medina MD, Kelly Rollins     Pancreatitis 10/14/2017   Past Surgical History:  Procedure Laterality Date   BREAST EXCISIONAL BIOPSY Left 1997   ? left side, done at time of reduction , benign   BREAST REDUCTION SURGERY     bilateral   CESAREAN SECTION     CHOLECYSTECTOMY     COLONOSCOPY     ESOPHAGOGASTRODUODENOSCOPY     ESOPHAGOGASTRODUODENOSCOPY  (EGD) WITH PROPOFOL N/A 10/21/2020   Procedure: ESOPHAGOGASTRODUODENOSCOPY (EGD) WITH PROPOFOL;  Surgeon: Kelly Rubenstein, MD;  Location: [facility] ENDOSCOPY;  Service: Endoscopy;  Laterality: N/A;   REDUCTION MAMMAPLASTY Bilateral 1997   VAGINAL HYSTERECTOMY     Patient Active Problem List   Diagnosis Date Noted   Abnormal MRI, lumbar spine (01/29/2020) 02/19/2020   Anxiety due to MRI 01/25/2020   Claustrophobia associated to MRI 01/25/2020   Enthesopathy of knee region (Right) 01/25/2020   Enthesopathy of knee region (Left) 01/25/2020   Lumbar facet hypertrophy 01/25/2020   Lumbosacral radiculopathy at S1 (Left) 01/17/2020   Lumbosacral radiculopathy (S1) (Left) 01/17/2020   Chronic lower extremity pain (Bilateral) (L>R) 01/17/2020   History of pancreatitis 07/18/2019   Obstructive sleep apnea syndrome 01/02/2019   Lumbar facet arthropathy (Multilevel) (Bilateral) 12/14/2018   Osteoarthritis involving multiple joints 11/28/2018   Chronic hip pain (Left) 11/16/2018   Acute hip pain (Left) 11/16/2018   DDD (degenerative disc disease), lumbosacral 11/16/2018   Chronic hip pain (Secondary source of pain) (Bilateral) (L>R) 05/19/2017   Elevated C-reactive protein (CRP) 05/19/2017   Elevated sed rate 05/19/2017   Spondylosis without myelopathy or radiculopathy, lumbosacral region  05/19/2017   Pharmacologic therapy 05/06/2017   Disorder of skeletal system 05/06/2017   Problems influencing health status 05/06/2017   Chest pain with moderate risk of acute coronary syndrome 04/21/2017   DOE (dyspnea on exertion) 04/21/2017   Intermittent palpitations 04/21/2017   Vitamin D insufficiency 05/04/2016   Depression 02/04/2016   Fatty liver 02/04/2016   Hypertension associated with diabetes ([place]) 02/04/2016   Chronic pain syndrome 01/15/2016   Acute low back pain 05/28/2015   Lumbar transverse process fracture (HCC) (Left L1) (03/11/15) 05/28/2015   Long term current use of opiate analgesic  05/01/2015   Long term prescription opiate use 05/01/2015   Opiate use (25 MME/Day) 05/01/2015   Encounter for therapeutic drug level monitoring 05/01/2015   Encounter for pain management planning 05/01/2015   Chronic low back pain (Primary Source of Pain) (Bilateral) (L>R) 05/01/2015   Lumbar facet syndrome (Bilateral) (L>R) 05/01/2015   Chronic knee pain [facility] source of pain) (Bilateral) (L>R) 05/01/2015   Lumbar spondylosis 05/01/2015   Neuropathic pain 05/01/2015   Neurogenic pain 05/01/2015   Myofascial pain 05/01/2015   Essential hypertriglyceridemia 04/26/2014   Type 2 diabetes mellitus with hyperglycemia, with long-term current use of insulin ([place]) 04/26/2014   Anxiety 05/15/2010   Obesity 05/15/2010   Hyperlipidemia 04/03/2010   Fatty liver disease 04/02/2010   Pure hypercholesterolemia 03/19/2010   GERD 04/17/2009   Constipation 04/17/2009   TOBACCO ABUSE 11/06/2008    ONSET DATE: January 2023  REFERRING DIAG: C71.9 (ICD-10-CM) - Malignant neoplasm of brain, unspecified  THERAPY DIAG:  Muscle weakness (generalized)  Other lack of coordination  Unsteadiness on feet  Stiffness of right knee, not elsewhere classified  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[place])  Rationale for Evaluation and Treatment Rehabilitation  SUBJECTIVE:                                                                                                                                                                                              SUBJECTIVE STATEMENT:  Pt spouse reports pt has been doing well, with exception of this morning she started having cramps after receiving a stressful message. Pt feeling cramps along whole R side. Pt spouse reports pt exhibited improved R hip flexion AROM the other day.  Pt accompanied by:  Kelly Rollins  PAIN:  Are you having pain? 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 Pt's husband, Kelly Rollins, present with pt. Kelly Rollins provides majority of hx as pt has difficulty  with speech due to Broca's aphasia (working with SLP). Pt reports partial paralysis of R side. She is unable to move her RUE, RLE also affected. Pt ambulates with hemiwalker in their home for short distances and does not use WC at home. She fatigues quickly with ambulation. Pt has AFO for RLE but unable to determine how to donn it without it hurting her LE. Pt currently with wound on R calf covered by large bandage that they have been treating for at least a month (reports physician aware). In addition to wound on R calf, pt reports R side skin irritation under breast and lateral trunk due to bra underwire. They have since removed underwire. Pt was hospitalized 10 days in June due to UTI. She has had at least 12 falls in the last six months. Reports no injuries with falls, and states "just bumps and bruises." Pt reports she has constant head pain. Pt spouse reports pt has pain throughout her whole right side down to LE. Can have bad cramps in RUE and RLE. Reports no numbness/tingling. Occ dizziness, unable to describe further. Pt sleeps in recliner, wakes up stiff in the morning. PMH per chart includes: Anorexia, dehydration, Adult Failure to thrive, contracture of muscles multiple sites. Pt./caregiver reports recently being hospitalized with a UTI, and dehydration. Extensive hx, please refer to chart for full details.   PAIN: from eval Are you having pain? Yes: NPRS scale: 3/10 Pain location: Top of head, front Pain description: States, "cancer pain" head pain Aggravating factors: unsure Relieving factors: medications  PRECAUTIONS: Fall  WEIGHT BEARING RESTRICTIONS No  FALLS: Has patient fallen in last 6 months? Yes. Number of falls 12  LIVING ENVIRONMENT: information primarily from chart Lives with: lives with their  spouse Lives in: House/apartment Stairs: Yes: External: 4 steps; bilateral but cannot reach both Has following equipment at home: Single point cane, Walker - 4 wheeled, Hemi walker, shower chair, bed side commode, Grab bars, and lift chair  PLOF: Independent  PATIENT GOALS  "Normal function" as much as possible. Improving her balance.   TODAY'S TREATMENT:   TherEx:  Nustep LVLs 0-1 x 4 minutes. PT assist throughout for RLE positioning. Pt did require 2 rest breaks. Noted a non-painful popping sensation felt near R side groin with intervention. This was not present with any other interventions performed.  STS from elevated chair height 5x CGA-min assist. Able to perform with push>stand technique from increased height  Gait for endurance with wheelchair follow, hemi-walker, and close CGA 2x10 meters.  Seated: Attempted 5-8 reps RLE march, continues to be limited by tone. LLE march no weight 10x, then with 3# donned LLE 12x, 8x LLE LAQ with 3# AW 8x RLE LAQ no weight 8x, use of external cue, very challenging    PATIENT EDUCATION: Education details: Pt educated throughout session about proper posture and technique with exercises. Improved exercise technique, movement at target joints, use of target muscles after min to mod verbal, visual, tactile cues.   Person educated: pt and her spouse Education method:Explanation, Demonstration, Tactile cues, and Verbal cues, Education comprehension: verbalized understanding, returned demonstration, tactile cues required, and needs further education   HOME EXERCISE PROGRAM:  Pt to perform only as many reps of STS per set as able  Access Code: [email or web address] URL: [email or web address] Date: 11/18/2021 Prepared by: Ricard Dillon  Exercises - Elbow Extension with Anchored Resistance  - 1 x daily - 5 x weekly - 3 sets - 10 reps  Access Code: [email or web address] URL: [email or web address] Date:  10/02/2021 Prepared by: Sande Brothers  Exercises - Seated Hip Flexion March with Ankle Weights  - 1 x daily - 7 x weekly - 3 sets - 10 reps - Seated Long Arc Quad  - 1 x daily - 7 x weekly - 3 sets - 10 reps - Supine Quad Set  - 1 x daily - 7 x weekly - 3 sets - 10 reps - Proper Sit to Stand Technique  - 1 x daily - 7 x weekly - 3 sets - 10 reps  GOALS: Goals reviewed with patient? Yes  SHORT TERM GOALS: Target date: 01/29/2022   Patient will be independent in home exercise program to improve strength/mobility for better functional independence with ADLs. Baseline: to be initiated next 1-2 sessions; 10/17 previously issued, pt is 11/16: to be advanced as pt has gained strength since previous assessment  Goal status: ONGOING   LONG TERM GOALS: Target date: 03/12/2022   Patient will demo ability to perform at least 1 STS by pushing from [facility] with LUE instead of pulling to stand to improve functional mobility Baseline: Goal changed as pt FOTO tracked by OT, with new goal: pt currently pulls to stand; 11/16: deferred due to pt feeling unwell; 11/22: able to complete with mod assist; 03/03/2022: unable to complete STS with push to stand technique Goal status: ONGOING   2.   Patient (< 85 years old) will be able to perform and complete five times sit to stand test from standard chair height in <30 seconds to indicate increased LE strength and improved balance. Baseline: Pt able to complete only 1 stand, insufficient strength currently to complete full test; 10/17 31 seconds pull to stand with UE, close CGA-min a 11/16: deferred due to pt feeling unwell; 03/03/2022: 30 sec pull to stand technique used Goal status:  PARTIALLY MET  3.  Pt will improve LLE strength by at least 1/2 point on MMT in order to increase ease with transfers and functional mobility. Baseline: LLE grossly 4/5 throughout; 10/17: still generally 4/5 B, however now exhibits AROM with R hip flexion and knee ext; 11/16: 4+/5 LLE, RLE 3-/5, noted improvement  however in quads and hip flexors  Goal status: MET  4.  Patient will increase 10 meter walk test to at least 0.80 m/s as to improve gait speed for better community ambulation and to reduce fall risk. Baseline: to be tested next 1-2 visits; 10/17: pt unable to perform 11/16: deferred due to pt feeling unwell; 11/21: 0.17 m/s; 03/03/22: 0.13 m/s with HW Goal status: IN PROGRESS  5.  Pt will complete STS from standard height chair with UUE assist off armrests and with no greater than min assist to improve ease and safety with transfers. Baseline: mod a x2; 10/17: pulling to stand with LUE CGA 11/16: deferred due to pt feeling unwell; 11/21: currently requires mod a to complete; 03/03/2022: pt attempted indep today but unable to complete Goal status: IN PROGRESS,   6.  Pt will improve RLE strength by at least 1/2 point on MMT in order to increase ease with transfers and functional mobility. Baseline: 11/16: 4+/5 LLE, RLE 3-/5, noted improvement however in quads and hip flexors on R; 03/03/2022: 3-/5 RLE, noted increased tone throughout RLE musculature  Goal status: NEW   ASSESSMENT:  CLINICAL IMPRESSION: Pt exhibits improved activity tolerance by performing multiple interventions today. She was also able to perform STS technique by pushing from arm-rest from increased seat height, requiring no more than CGA-min assist.  While pt shows progress, she continues to be limited with therex in RLE due to increase mm tone. The pt would benefit from continued skilled PT to address immobility, LE muscle weakness, and pain in order to decrease fall risk and increase functional mobility and QOL. Over reporting period pt with fluctuating presentation due to CA treatments,illness, however, pt overall has gradually improved her ability to ambulate with HW for short distances and to complete transfers. Patient's condition has the potential to improve in response to therapy. Maximum improvement is yet to be obtained. The  anticipated improvement is attainable and reasonable in a generally predictable time.      OBJECTIVE IMPAIRMENTS Abnormal gait, decreased activity tolerance, decreased balance, decreased coordination, decreased endurance, decreased mobility, difficulty walking, decreased ROM, decreased strength, hypomobility, increased edema, increased fascial restrictions, increased muscle spasms, impaired flexibility, impaired sensation, impaired tone, impaired UE functional use, improper body mechanics, postural dysfunction, and pain.   ACTIVITY LIMITATIONS carrying, lifting, bending, sitting, standing, squatting, stairs, transfers, bed mobility, bathing, toileting, dressing, reach over head, hygiene/grooming, locomotion level, and caring for others  PARTICIPATION LIMITATIONS: meal prep, cleaning, laundry, medication management, personal finances, driving, shopping, community activity, occupation, and yard work  PERSONAL Education officer, community, Past/current experiences, Sex, and 3+ comorbidities: Anorexia, dehydration, Adult Failure to thrive, contracture of muscles multiple sites, multiple falls, UTI, extensive PMH refer to chart  are also affecting patient's functional outcome.   REHAB POTENTIAL: Fair    CLINICAL DECISION MAKING: Evolving/moderate complexity  EVALUATION COMPLEXITY: High  Rollins: PT FREQUENCY: 2x/week  PT DURATION: 12 weeks  PLANNED INTERVENTIONS: Therapeutic exercises, Therapeutic activity, Neuromuscular re-education, Balance training, Gait training, Patient/Family education, Self Care, Joint mobilization, Joint manipulation, Stair training, Vestibular training, Canalith repositioning, Orthotic/Fit training, DME instructions, Wheelchair mobility training, Spinal mobilization, Cryotherapy, Moist heat, scar mobilization, Splintting, Taping, Manual therapy, and Re-evaluation  Rollins FOR NEXT SESSION:  stretching, strengthening, gait, balance, manual, standing balance, RLE stretching 10:03 AM,  04/07/22   Zollie Pee, PT 04/07/2022, 10:03 AM

## 2022-03-25 NOTE — Therapy (Signed)
OUTPATIENT OCCUPATIONAL NEURO TREATMENT NOTE  Patient Name: NATSHA MCKEOWN MRN: [id number] DOB:09-14-1970, 52 y.o., female Today's Date: 03/25/2022  PCP: [name], PA-C REFERRING PROVIDER: [name], Corrine Jeani Hawking, PA-C   OT End of Session - 03/25/22 1321     Visit Number 32    Number of Visits 6    Date for OT Re-Evaluation 04/28/22    Authorization Time Period Reporting period beginning 03/03/22    Progress Note Due on Visit 10    OT Start Time 1300    OT Stop Time 1345    OT Time Calculation (min) 45 min    Equipment Utilized During Treatment wc    Activity Tolerance Patient tolerated treatment well    Behavior During Therapy [facility] for tasks assessed/performed            Past Medical History:  Diagnosis Date   ABDOMINAL PAIN OTHER SPECIFIED SITE 02/21/2009   Qualifier: Diagnosis of  By: Deborra Medina MD, Talia     ABDOMINAL PAIN-LUQ 04/17/2009   Qualifier: Diagnosis of  By: Fuller Plan MD Lamont Snowball T    Anal fissure    Anginal pain ([place])    Asthma    Depression    Diabetes mellitus without complication ([place])    Dyspnea    Dysrhythmia    Headache(784.0)    HEMATOCHEZIA 04/17/2009   Qualifier: Diagnosis of  By: Fuller Plan MD Lamont Snowball T    Hyperlipidemia    Hypertension    IBS (irritable bowel syndrome)    Migraine headache 11/06/2008   Qualifier: Diagnosis of  By: Deborra Medina MD, Talia     OVARIAN CYST, RIGHT 02/21/2009   Qualifier: Diagnosis of  By: Deborra Medina MD, Talia     PALPITATIONS, OCCASIONAL 04/17/2010   Qualifier: Diagnosis of  By: Deborra Medina MD, Talia     Pancreatitis 10/14/2017   Past Surgical History:  Procedure Laterality Date   BREAST EXCISIONAL BIOPSY Left 1997   ? left side, done at time of reduction , benign   BREAST REDUCTION SURGERY     bilateral   CESAREAN SECTION     CHOLECYSTECTOMY     COLONOSCOPY     ESOPHAGOGASTRODUODENOSCOPY     ESOPHAGOGASTRODUODENOSCOPY (EGD) WITH PROPOFOL N/A 10/21/2020   Procedure: ESOPHAGOGASTRODUODENOSCOPY (EGD) WITH  PROPOFOL;  Surgeon: Lesly Rubenstein, MD;  Location: [facility] ENDOSCOPY;  Service: Endoscopy;  Laterality: N/A;   REDUCTION MAMMAPLASTY Bilateral 1997   VAGINAL HYSTERECTOMY     Patient Active Problem List   Diagnosis Date Noted   Abnormal MRI, lumbar spine (01/29/2020) 02/19/2020   Anxiety due to MRI 01/25/2020   Claustrophobia associated to MRI 01/25/2020   Enthesopathy of knee region (Right) 01/25/2020   Enthesopathy of knee region (Left) 01/25/2020   Lumbar facet hypertrophy 01/25/2020   Lumbosacral radiculopathy at S1 (Left) 01/17/2020   Lumbosacral radiculopathy (S1) (Left) 01/17/2020   Chronic lower extremity pain (Bilateral) (L>R) 01/17/2020   History of pancreatitis 07/18/2019   Obstructive sleep apnea syndrome 01/02/2019   Lumbar facet arthropathy (Multilevel) (Bilateral) 12/14/2018   Osteoarthritis involving multiple joints 11/28/2018   Chronic hip pain (Left) 11/16/2018   Acute hip pain (Left) 11/16/2018   DDD (degenerative disc disease), lumbosacral 11/16/2018   Chronic hip pain (Secondary source of pain) (Bilateral) (L>R) 05/19/2017   Elevated C-reactive protein (CRP) 05/19/2017   Elevated sed rate 05/19/2017   Spondylosis without myelopathy or radiculopathy, lumbosacral region 05/19/2017   Pharmacologic therapy 05/06/2017   Disorder of skeletal system 05/06/2017   Problems influencing health status  05/06/2017   Chest pain with moderate risk of acute coronary syndrome 04/21/2017   DOE (dyspnea on exertion) 04/21/2017   Intermittent palpitations 04/21/2017   Vitamin D insufficiency 05/04/2016   Depression 02/04/2016   Fatty liver 02/04/2016   Hypertension associated with diabetes ([place]) 02/04/2016   Chronic pain syndrome 01/15/2016   Acute low back pain 05/28/2015   Lumbar transverse process fracture (HCC) (Left L1) (03/11/15) 05/28/2015   Long term current use of opiate analgesic 05/01/2015   Long term prescription opiate use 05/01/2015   Opiate use (25 MME/Day)  05/01/2015   Encounter for therapeutic drug level monitoring 05/01/2015   Encounter for pain management planning 05/01/2015   Chronic low back pain (Primary Source of Pain) (Bilateral) (L>R) 05/01/2015   Lumbar facet syndrome (Bilateral) (L>R) 05/01/2015   Chronic knee pain [facility] source of pain) (Bilateral) (L>R) 05/01/2015   Lumbar spondylosis 05/01/2015   Neuropathic pain 05/01/2015   Neurogenic pain 05/01/2015   Myofascial pain 05/01/2015   Essential hypertriglyceridemia 04/26/2014   Type 2 diabetes mellitus with hyperglycemia, with long-term current use of insulin ([place]) 04/26/2014   Anxiety 05/15/2010   Obesity 05/15/2010   Hyperlipidemia 04/03/2010   Fatty liver disease 04/02/2010   Pure hypercholesterolemia 03/19/2010   GERD 04/17/2009   Constipation 04/17/2009   TOBACCO ABUSE 11/06/2008   ONSET DATE: 02/06/2021  REFERRING DIAG: Astrocytoma  THERAPY DIAG:  Muscle weakness (generalized)  Other lack of coordination  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HCC)  Rationale for Evaluation and Treatment Rehabilitation  SUBJECTIVE:  SUBJECTIVE STATEMENT: Pt reports that her husband has been working a lot to care for herself and pt's mother as mother has been staying with them since she had a recent stroke.  Pt reports that they're hoping pt's mother can get strong enough soon to be able to return to her home.   Pt accompanied by: significant other  PERTINENT HISTORY:  Pt. Is a 52 y.o. 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 PMHx includes: Anorexia, dehydration, Adult Failure to thrive, contracture of muscles multiple sites. Pt./caregiver reports recently being hospitalized with a UTI, and dehydration.   PRECAUTIONS: Fall  WEIGHT BEARING RESTRICTIONS No  PAIN:  Are you having pain?  Pt. reports 6/10 pain in the entire R side.   FALLS: Has patient fallen in last 6 months? Yes. Number of falls  Multiple Falls  LIVING ENVIRONMENT: Lives with: Family Lives in: House/apartment Stairs: Yes: External: 4 steps; bilateral but cannot reach both Has following equipment at home: Single point cane, Walker - 4 wheeled, Hemi walker, shower chair, bed side commode, and Grab bars Lift chair in the tub, [place],  PLOF: Independent  PATIENT GOALS: To improve the right UE  OBJECTIVE:   HAND DOMINANCE: Right  ADLs: Transfers/ambulation related to ADLs: Eating: MaxA with cutting food, setting up, Modified I with nondominant Left hand, [employer] Mat  Grooming: Independent  UB Dressing: MaxA  LB Dressing: MaxA Toileting: Uses a BSCommode mInA transfers, MaxA clothing negotiation Bathing: MaxA Tub Shower transfers:  Uses a shower lift chair Equipment:  w/c, BSCommode, cane   IADLs: Shopping: Husband performs Light housekeeping: Max Meal Prep: MaxA Community mobility: Relies on family Medication management:Husband assists Doctor, hospital: Performs together Handwriting: Not legible  MOBILITY STATUS: Hx of falls   ACTIVITY TOLERANCE: Activity tolerance: limited  FUNCTIONAL OUTCOME MEASURES: FOTO: 11.76  UPPER EXTREMITY ROM  : WFL Left AROM/PROM   Passive ROM Right eval  Right  08/18/21 Right 09/08/2021 Right 10/30/2021 Right 02/03/22 Right 03/03/22  Shoulder flexion 30   48 15 55 65  Shoulder abduction 40 62 53 45 55 60  Shoulder adduction           Shoulder extension           Shoulder internal rotation           Shoulder external rotation           Elbow flexion 120   110 122 140   Elbow extension -67   -46 -72 -30   Wrist flexion Neutral   Neutral neutral 60   Wrist extension 12   40 25 28   Wrist ulnar deviation           Wrist radial deviation           Wrist pronation           Wrist supination           (Blank rows = not tested)   UPPER EXTREMITY MMT:      MMT Right eval Right 02/03/22 Left eval  Shoulder flexion 0/5 0/5 4+/5  Shoulder abduction 0/5 0/5  4+/5  Shoulder adduction       Shoulder extension       Shoulder internal rotation       Shoulder external rotation       Middle trapezius       Lower trapezius       Elbow flexion 0/5 0/5 4+/5  Elbow extension 0/5 0/5 4+/5  Wrist flexion       Wrist extension 0/5 0/5 4+/5  Wrist ulnar deviation       Wrist radial deviation       Wrist pronation       Wrist supination       (Blank rows = not tested)   FOTO score: 09/08/2021: 11   FOTO score 02/03/22: 11.76  SENSATION: Light touch: WFL  EDEMA:   Wrist: 18.5 cm MCP: 21.5 cm  10/30/2021:  Wrist: 17.5 cm MCPs: 20cm  02/03/22:  Wrist: 17.3 cm MCP: 20.0 cm  MUSCLE TONE: RUE: Severe  COGNITION: Overall cognitive status: Impaired  VISION: Subjective report: Wears glasses Baseline vision: Wears glasses all the time Pt. Reports no changes   PRAXIS: Impaired: Motor planning  TODAY'S TREATMENT:  Moist heat applied to R shoulder and hand x5 min in prep for therapeutic exercise, for pain management and muscle relaxation, and applied intermittently throughout session during rest breaks.   Therapeutic Exercise: Performed passive stretching throughout the RUE, working to reduce tone, increase muscle relaxation and maximize ROM to ease self care tasks.  Performed passive R scapular depression, scapular abd/add with manual gliding of scapula. Performed R passive shoulder flex, abd with gentle slide on lap pillow.  Performed passive ER/IR with elbow at side, elbow flex/ext, forearm pron/sup, wrist flex/ext, and digit flex/ext.    Self Care: Noted some flakiness and fungal odor on the palm of R hand, and pt noted an odor to her hand.  OT assisted pt with set up to clean the hand with warm soapy wash cloth and towel.  OT assisted with positioning the hand into slight wrist flexion to decrease flexor synergy in the hand.  OT able to gentle extend digits enough for pt to wash palm and some between fingers.  Pt able to perform hand hygiene  with mod A, including washing and drying, OT assist for thoroughness and positioning hand/fingers.  Reinforced importance of frequent hand hygiene for cleanliness and to prevent skin breakdown or fungal issues.  Encouraged pt to soak hand in warm water as needed and make sure to dry thoroughly.  Pt verbalized understanding.     PATIENT EDUCATION: Education details: RUE ROM, tone reduction strategies.  Person educated: Patient and Spouse Education method: explanation, vc Education comprehension: verbalized/demonstrated understanding, additional educ needed   HOME EXERCISE PROGRAM:   Pt./caregiver education about positioning of the RUE, ROM  GOALS: Goals reviewed with patient? Yes  SHORT TERM GOALS: Target date: 03/17/22 Pt./caregivers will demonstrate independence with HEPs for UE ROM, and edema control Baseline: 03/03/22: Ongoing; initiated prolonged stretch for R shoulder flexion with pillows in lap, arm resting on top.  Pt to try this at home as she responded well to this.  02/03/22: Caregiver assist for RUE positioning to reduce edema.  Improved from initial eval.  Additional training needed for caregiver PROM throughout the RUE.  10/30/2021: Pt. Edema has improved. Pt. Requires caregiver assist for positioning. 09/08/2021: Pt. Requires assist for positioning, ROM, edema control. Eval: No current HEPs  Goal status: Ongoing  LONG TERM GOALS: Target date: 04/28/22 Pt. Will improve right hand edema by 1 cm at the wrist, and MCPs Baseline: 03/03/22: wrist 17.5 cm, MCPs: 19.6 cm 02/03/22: wrist 17.3 cm, MCPs: 20 cm; 10/30/2021:  wrist: 17.5 cm, MCPs: 20 cm 09/08/2021: Wrist: 17.5 cm, MCPs: 20.5 cm Eval: Wrist: 18.5cm, MCPs: 21.5cm Goal status: Ongoing  2.  Pt. Will improve right shoulder PROM to 90 degrees for improved tolerance to UB ADLs. Baseline: 03/03/22: flexion 65, abd 60; 02/03/22: shoulder flexion 55, abd 55; 10/30/2021: shoulder flexion: 15, abduction: 45 09/08/2021: Right shoulder flexion: 48,  abduction: 53 Eval: PROM shoulder flexion: 30 degrees, Abduction: 40 degrees Goal status: Ongoing/ revised  3.  Pt. Will improve right elbow PROM by 10 degrees to prepare for hand to mouth patterns Baseline: 02/03/22: elbow flex 140, ext -30; 10/30/2021: elbow extension -72, flexion: 122 09/08/2021: elbow extension: -46, flexion: 110 Eval:   elbow extension -67, flexion 120 Goal status: achieved  4.  Pt. Will improve PROM right wrist extension to 45 degrees or better to enable WB through the hand during transfers and ADLs. Baseline: 02/03/22: wrist ext 28; 10/30/2021: wrist extension 25 09/08/2021: Wrist extension: 40 Eval: wrist extension 12 degrees, flexion to neutral Goal status: Ongoing/ revised  5.  Pt. Will tolerate full digit extension in the right hand to promote good skin integrity through the palmar surface of the right hand  Baseline: 03/03/22: Pt tolerated ~80% of full passive digit extension range; 02/03/22: Pt tolerates ROM through ~75% of passive digit extension range; 10/30/2021: Pt. Tolerates ROM through 50% of  digit extension range, occasionally 75% 09/08/2021: Pt. Is able to tolerate PROM through 50% of digit extension in the digits. Eval: Pt. Maintains digits in tight flexion Goal status: Ongoing  6.  Pt. Will require minA UE dressing using one armed dressing techniques. Baseline: 03/03/22: max A pull over shirt; 02/03/22: max A pull over shirt; 10/30/2021: Independent with button down shirt/sweater/cardigan, MaxA pullover shirt.09/08/2021: MaxA Eval: MaxA Goal status: Ongoing   7. Pt will tolerate 15 min or more of supine lying for caregiver to perform PROM throughout the RUE and for benefits of pressure relief and trunk extension.    Baseline: 03/03/22: Pt tolerated 3 nights supine in a bed while vacationing at the beach this month, but has not attempted supine in the home since that trip.  Spouse  reported it took about 45 min to get pt comfortably positioned.  02/03/22: pt has not yet  attempted at home but was able to tolerate 10 min in supine on mat in clinic.  Goal status: ongoing  ASSESSMENT:  CLINICAL IMPRESSION: Noted some flakiness and fungal odor on the palm of R hand, and pt noted an odor to her hand.  Reviewed hand hygiene and assisted pt to complete this today, requiring mod A for thoroughness.  RUE remains severely stiff and painful proximally to distally, though pt and spouse both acknowledge increased effort to stretch more throughout the week, which has improved pt's tolerance for passive stretch during OT sessoins.  Will continue to benefit from OT to work towards improving pain, ROM, and to increase engagement of the RUE during ADLs, and IADLs.   PERFORMANCE DEFICITS in functional skills including ADLs, IADLs, coordination, proprioception, sensation, edema, tone, ROM, strength, pain, FMC, GMC, balance, decreased knowledge of use of DME, skin integrity, and UE functional use, cognitive skills including attention, memory, and safety awareness, and psychosocial skills including coping strategies.   IMPAIRMENTS are limiting patient from ADLs, IADLs, education, work, leisure, and social participation.   COMORBIDITIES may have co-morbidities  that affects occupational performance. Patient will benefit from skilled OT to address above impairments and improve overall function.  MODIFICATION OR ASSISTANCE TO COMPLETE EVALUATION: Maximum modification of tasks or assist with assess necessary to complete an evaluation.  OT OCCUPATIONAL PROFILE AND HISTORY: Detailed assessment: Review of records and additional review of physical, cognitive, psychosocial history related to current functional performance.  CLINICAL DECISION MAKING: High - multiple treatment options, significant modification of task necessary  REHAB POTENTIAL: Good for stated goals  EVALUATION COMPLEXITY: High    PLAN: OT FREQUENCY: 1-2x/week  OT DURATION: 12 weeks  PLANNED INTERVENTIONS: self  care/ADL training, therapeutic exercise, therapeutic activity, neuromuscular re-education, manual therapy, passive range of motion, paraffin, moist heat, contrast bath, patient/family education, cognitive remediation/compensation, energy conservation, and coping strategies training  RECOMMENDED OTHER SERVICES: PT, and ST  CONSULTED AND AGREED WITH PLAN OF CARE: Patient and family member/caregiver  PLAN FOR NEXT SESSION: Initiate PROM, and RUE Edema control techniques   Leta Speller, MS, OTR/L  Darleene Cleaver, OT 03/25/2022, 1:22 PM

## 2022-03-26 ENCOUNTER — Ambulatory Visit: Payer: Medicare Other

## 2022-03-31 ENCOUNTER — Ambulatory Visit: Payer: Medicare Other

## 2022-03-31 DIAGNOSIS — R278 Other lack of coordination: Secondary | ICD-10-CM

## 2022-03-31 DIAGNOSIS — C719 Malignant neoplasm of brain, unspecified: Secondary | ICD-10-CM

## 2022-03-31 DIAGNOSIS — M6281 Muscle weakness (generalized): Secondary | ICD-10-CM

## 2022-03-31 DIAGNOSIS — R2681 Unsteadiness on feet: Secondary | ICD-10-CM

## 2022-03-31 DIAGNOSIS — R262 Difficulty in walking, not elsewhere classified: Secondary | ICD-10-CM

## 2022-03-31 NOTE — Therapy (Signed)
OUTPATIENT OCCUPATIONAL NEURO TREATMENT NOTE  Patient Name: Kelly Rollins MRN: [id number] DOB:[birth year], 52 y.o., female Today's Date: 03/31/2022  PCP: Kelly Rossetti, PA-C REFERRING PROVIDER: Elnora Rollins, Corrine Jeani Hawking, PA-C   OT End of Session - 03/31/22 1701     Visit Number 33    Number of Visits 65    Date for OT Re-Evaluation 04/28/22    Authorization Time Period Reporting period beginning 03/03/22    Progress Note Due on Visit 10    OT Start Time 1255    OT Stop Time 1345    OT Time Calculation (min) 50 min    Equipment Utilized During Treatment wc    Activity Tolerance Patient tolerated treatment well    Behavior During Therapy [facility] for tasks assessed/performed            Past Medical History:  Diagnosis Date   ABDOMINAL PAIN OTHER SPECIFIED SITE 02/21/2009   Qualifier: Diagnosis of  By: Deborra Medina MD, Talia     ABDOMINAL PAIN-LUQ 04/17/2009   Qualifier: Diagnosis of  By: Fuller Plan MD Lamont Snowball T    Anal fissure    Anginal pain ([place])    Asthma    Depression    Diabetes mellitus without complication ([place])    Dyspnea    Dysrhythmia    Headache(784.0)    HEMATOCHEZIA 04/17/2009   Qualifier: Diagnosis of  By: Fuller Plan MD Lamont Snowball T    Hyperlipidemia    Hypertension    IBS (irritable bowel syndrome)    Migraine headache 11/06/2008   Qualifier: Diagnosis of  By: Deborra Medina MD, Talia     OVARIAN CYST, RIGHT 02/21/2009   Qualifier: Diagnosis of  By: Deborra Medina MD, Talia     PALPITATIONS, OCCASIONAL 04/17/2010   Qualifier: Diagnosis of  By: Deborra Medina MD, Talia     Pancreatitis 10/14/2017   Past Surgical History:  Procedure Laterality Date   BREAST EXCISIONAL BIOPSY Left 1997   ? left side, done at time of reduction , benign   BREAST REDUCTION SURGERY     bilateral   CESAREAN SECTION     CHOLECYSTECTOMY     COLONOSCOPY     ESOPHAGOGASTRODUODENOSCOPY     ESOPHAGOGASTRODUODENOSCOPY (EGD) WITH PROPOFOL N/A 10/21/2020   Procedure: ESOPHAGOGASTRODUODENOSCOPY (EGD) WITH  PROPOFOL;  Surgeon: Lesly Rubenstein, MD;  Location: [facility] ENDOSCOPY;  Service: Endoscopy;  Laterality: N/A;   REDUCTION MAMMAPLASTY Bilateral 1997   VAGINAL HYSTERECTOMY     Patient Active Problem List   Diagnosis Date Noted   Abnormal MRI, lumbar spine (01/29/2020) 02/19/2020   Anxiety due to MRI 01/25/2020   Claustrophobia associated to MRI 01/25/2020   Enthesopathy of knee region (Right) 01/25/2020   Enthesopathy of knee region (Left) 01/25/2020   Lumbar facet hypertrophy 01/25/2020   Lumbosacral radiculopathy at S1 (Left) 01/17/2020   Lumbosacral radiculopathy (S1) (Left) 01/17/2020   Chronic lower extremity pain (Bilateral) (L>R) 01/17/2020   History of pancreatitis 07/18/2019   Obstructive sleep apnea syndrome 01/02/2019   Lumbar facet arthropathy (Multilevel) (Bilateral) 12/14/2018   Osteoarthritis involving multiple joints 11/28/2018   Chronic hip pain (Left) 11/16/2018   Acute hip pain (Left) 11/16/2018   DDD (degenerative disc disease), lumbosacral 11/16/2018   Chronic hip pain (Secondary source of pain) (Bilateral) (L>R) 05/19/2017   Elevated C-reactive protein (CRP) 05/19/2017   Elevated sed rate 05/19/2017   Spondylosis without myelopathy or radiculopathy, lumbosacral region 05/19/2017   Pharmacologic therapy 05/06/2017   Disorder of skeletal system 05/06/2017   Problems influencing health status  05/06/2017   Chest pain with moderate risk of acute coronary syndrome 04/21/2017   DOE (dyspnea on exertion) 04/21/2017   Intermittent palpitations 04/21/2017   Vitamin D insufficiency 05/04/2016   Depression 02/04/2016   Fatty liver 02/04/2016   Hypertension associated with diabetes ([place]) 02/04/2016   Chronic pain syndrome 01/15/2016   Acute low back pain 05/28/2015   Lumbar transverse process fracture (HCC) (Left L1) (03/11/15) 05/28/2015   Long term current use of opiate analgesic 05/01/2015   Long term prescription opiate use 05/01/2015   Opiate use (25 MME/Day)  05/01/2015   Encounter for therapeutic drug level monitoring 05/01/2015   Encounter for pain management planning 05/01/2015   Chronic low back pain (Primary Source of Pain) (Bilateral) (L>R) 05/01/2015   Lumbar facet syndrome (Bilateral) (L>R) 05/01/2015   Chronic knee pain [facility] source of pain) (Bilateral) (L>R) 05/01/2015   Lumbar spondylosis 05/01/2015   Neuropathic pain 05/01/2015   Neurogenic pain 05/01/2015   Myofascial pain 05/01/2015   Essential hypertriglyceridemia 04/26/2014   Type 2 diabetes mellitus with hyperglycemia, with long-term current use of insulin ([place]) 04/26/2014   Anxiety 05/15/2010   Obesity 05/15/2010   Hyperlipidemia 04/03/2010   Fatty liver disease 04/02/2010   Pure hypercholesterolemia 03/19/2010   GERD 04/17/2009   Constipation 04/17/2009   TOBACCO ABUSE 11/06/2008   ONSET DATE: 02/06/2021  REFERRING DIAG: Astrocytoma  THERAPY DIAG:  Muscle weakness (generalized)  Other lack of coordination  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HCC)  Rationale for Evaluation and Treatment Rehabilitation  SUBJECTIVE:  SUBJECTIVE STATEMENT: Pt reports that she and her husband were able to spend the night away on Saturday as they were visiting a church out of town.  Pt and spouse report they had a difficult time getting pt comfortable in the bed, and spouse thinks he may not have had her lower back supported enough as she was propped up in a nearly sitting position in the bed rather than in supine.  Pt accompanied by: significant other  PERTINENT HISTORY:  Pt. Is a 52 y.o. 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 PMHx includes: Anorexia, dehydration, Adult Failure to thrive, contracture of muscles multiple sites. Pt./caregiver reports recently being hospitalized with a UTI, and dehydration.   PRECAUTIONS: Fall  WEIGHT BEARING RESTRICTIONS No  PAIN:  Are you having pain?  Pt. reports  4/10 pain in the entire R side.   FALLS: Has patient fallen in last 6 months? Yes. Number of falls Multiple Falls  LIVING ENVIRONMENT: Lives with: Family Lives in: House/apartment Stairs: Yes: External: 4 steps; bilateral but cannot reach both Has following equipment at home: Single point cane, Walker - 4 wheeled, Hemi walker, shower chair, bed side commode, and Grab bars Lift chair in the tub, [place],  PLOF: Independent  PATIENT GOALS: To improve the right UE  OBJECTIVE:   HAND DOMINANCE: Right  ADLs: Transfers/ambulation related to ADLs: Eating: MaxA with cutting food, setting up, Modified I with nondominant Left hand, Silicon Mat  Grooming: Independent  UB Dressing: MaxA  LB Dressing: MaxA Toileting: Uses a BSCommode mInA transfers, MaxA clothing negotiation Bathing: MaxA Tub Shower transfers:  Uses a shower lift chair Equipment:  w/c, BSCommode, cane   IADLs: Shopping: Husband performs Light housekeeping: Max Meal Prep: MaxA Community mobility: Relies on family Medication management:Husband assists Doctor, hospital: Performs together Handwriting: Not legible  MOBILITY STATUS: Hx of falls   ACTIVITY TOLERANCE: Activity tolerance: limited  FUNCTIONAL  OUTCOME MEASURES: FOTO: 11.76  UPPER EXTREMITY ROM  : WFL Left AROM/PROM   Passive ROM Right eval Right  08/18/21 Right 09/08/2021 Right 10/30/2021 Right 02/03/22 Right 03/03/22  Shoulder flexion 30   48 15 55 65  Shoulder abduction 40 62 53 45 55 60  Shoulder adduction           Shoulder extension           Shoulder internal rotation           Shoulder external rotation           Elbow flexion 120   110 122 140   Elbow extension -67   -46 -72 -30   Wrist flexion Neutral   Neutral neutral 60   Wrist extension 12   40 25 28   Wrist ulnar deviation           Wrist radial deviation           Wrist pronation           Wrist supination           (Blank rows = not tested)   UPPER EXTREMITY MMT:      MMT  Right eval Right 02/03/22 Left eval  Shoulder flexion 0/5 0/5 4+/5  Shoulder abduction 0/5 0/5 4+/5  Shoulder adduction       Shoulder extension       Shoulder internal rotation       Shoulder external rotation       Middle trapezius       Lower trapezius       Elbow flexion 0/5 0/5 4+/5  Elbow extension 0/5 0/5 4+/5  Wrist flexion       Wrist extension 0/5 0/5 4+/5  Wrist ulnar deviation       Wrist radial deviation       Wrist pronation       Wrist supination       (Blank rows = not tested)   FOTO score: 09/08/2021: 11   FOTO score 02/03/22: 11.76  SENSATION: Light touch: WFL  EDEMA:   Wrist: 18.5 cm MCP: 21.5 cm  10/30/2021:  Wrist: 17.5 cm MCPs: 20cm  02/03/22:  Wrist: 17.3 cm MCP: 20.0 cm  MUSCLE TONE: RUE: Severe  COGNITION: Overall cognitive status: Impaired  VISION: Subjective report: Wears glasses Baseline vision: Wears glasses all the time Pt. Reports no changes   PRAXIS: Impaired: Motor planning  TODAY'S TREATMENT:  Moist heat applied to R shoulder and hand x5 min in prep for therapeutic exercise, for pain management and muscle relaxation, and applied intermittently throughout session during rest breaks.   Therapeutic Exercise: Performed passive stretching throughout the RUE, working to reduce tone, increase muscle relaxation and maximize ROM to ease self care tasks.  Performed passive R scapular depression, scapular abd/add with manual gliding of scapula. Performed R passive shoulder flex, abd with gentle slide on lap pillow.  Performed passive ER/IR with elbow at side, elbow flex/ext, forearm pron/sup, wrist flex/ext, and digit flex/ext.    PATIENT EDUCATION: Education details: supportive positioning strategies in supine Person educated: Patient and Spouse Education method: explanation, vc Education comprehension: verbalized/demonstrated understanding, additional educ needed  HOME EXERCISE PROGRAM:  Pt./caregiver education about positioning of the  RUE, ROM  GOALS: Goals reviewed with patient? Yes  SHORT TERM GOALS: Target date: 03/17/22 Pt./caregivers will demonstrate independence with HEPs for UE ROM, and edema control Baseline: 03/03/22: Ongoing; initiated prolonged stretch for R shoulder flexion with pillows in  lap, arm resting on top.  Pt to try this at home as she responded well to this.  02/03/22: Caregiver assist for RUE positioning to reduce edema.  Improved from initial eval.  Additional training needed for caregiver PROM throughout the RUE.  10/30/2021: Pt. Edema has improved. Pt. Requires caregiver assist for positioning. 09/08/2021: Pt. Requires assist for positioning, ROM, edema control. Eval: No current HEPs  Goal status: Ongoing  LONG TERM GOALS: Target date: 04/28/22 Pt. Will improve right hand edema by 1 cm at the wrist, and MCPs Baseline: 03/03/22: wrist 17.5 cm, MCPs: 19.6 cm 02/03/22: wrist 17.3 cm, MCPs: 20 cm; 10/30/2021:  wrist: 17.5 cm, MCPs: 20 cm 09/08/2021: Wrist: 17.5 cm, MCPs: 20.5 cm Eval: Wrist: 18.5cm, MCPs: 21.5cm Goal status: Ongoing  2.  Pt. Will improve right shoulder PROM to 90 degrees for improved tolerance to UB ADLs. Baseline: 03/03/22: flexion 65, abd 60; 02/03/22: shoulder flexion 55, abd 55; 10/30/2021: shoulder flexion: 15, abduction: 45 09/08/2021: Right shoulder flexion: 48, abduction: 53 Eval: PROM shoulder flexion: 30 degrees, Abduction: 40 degrees Goal status: Ongoing/ revised  3.  Pt. Will improve right elbow PROM by 10 degrees to prepare for hand to mouth patterns Baseline: 02/03/22: elbow flex 140, ext -30; 10/30/2021: elbow extension -72, flexion: 122 09/08/2021: elbow extension: -46, flexion: 110 Eval:   elbow extension -67, flexion 120 Goal status: achieved  4.  Pt. Will improve PROM right wrist extension to 45 degrees or better to enable WB through the hand during transfers and ADLs. Baseline: 02/03/22: wrist ext 28; 10/30/2021: wrist extension 25 09/08/2021: Wrist extension: 40 Eval: wrist extension 12  degrees, flexion to neutral Goal status: Ongoing/ revised  5.  Pt. Will tolerate full digit extension in the right hand to promote good skin integrity through the palmar surface of the right hand  Baseline: 03/03/22: Pt tolerated ~80% of full passive digit extension range; 02/03/22: Pt tolerates ROM through ~75% of passive digit extension range; 10/30/2021: Pt. Tolerates ROM through 50% of  digit extension range, occasionally 75% 09/08/2021: Pt. Is able to tolerate PROM through 50% of digit extension in the digits. Eval: Pt. Maintains digits in tight flexion Goal status: Ongoing  6.  Pt. Will require minA UE dressing using one armed dressing techniques. Baseline: 03/03/22: max A pull over shirt; 02/03/22: max A pull over shirt; 10/30/2021: Independent with button down shirt/sweater/cardigan, MaxA pullover shirt.09/08/2021: MaxA Eval: MaxA Goal status: Ongoing   7. Pt will tolerate 15 min or more of supine lying for caregiver to perform PROM throughout the RUE and for benefits of pressure relief and trunk extension.    Baseline: 03/03/22: Pt tolerated 3 nights supine in a bed while vacationing at the beach this month, but has not attempted supine in the home since that trip.  Spouse reported it took about 45 min to get pt comfortably positioned.  02/03/22: pt has not yet attempted at home but was able to tolerate 10 min in supine on mat in clinic.  Goal status: ongoing  ASSESSMENT:  CLINICAL IMPRESSION: Pt reports that she and her husband were able to spend the night away on Saturday as they were visiting a church out of town.  Pt and spouse report they had a difficult time getting pt comfortable in the bed, and spouse thinks he may not have had her lower back supported enough as she was propped up in a nearly sitting position in the bed rather than in supine.  OT encouraged pt/spouse to work towards supine  positioning to relieve pressure from buttocks and allow extension of back and hips and to utilize pillows  as needed to prop head and support RUE.  Pt/spouse agreed, but pt preferred to work on stretching the RUE this date rather than attempt supine positioning on mat table.  Pt verbalized a lot of pain and stiffness in the shoulder.  Spouse reports they have had limited time to stretch at home since they were away over the weekend.  OT reinforced benefits of regular stretching to reduce pain and stiffness and encouraged pt and spouse work their stretching routine into bathing/dressing and other ADLs as able.  Both verbalized understanding.  Will continue to benefit from OT to work towards improving pain, ROM, and to increase engagement of the RUE during ADLs, and IADLs.   PERFORMANCE DEFICITS in functional skills including ADLs, IADLs, coordination, proprioception, sensation, edema, tone, ROM, strength, pain, FMC, GMC, balance, decreased knowledge of use of DME, skin integrity, and UE functional use, cognitive skills including attention, memory, and safety awareness, and psychosocial skills including coping strategies.   IMPAIRMENTS are limiting patient from ADLs, IADLs, education, work, leisure, and social participation.   COMORBIDITIES may have co-morbidities  that affects occupational performance. Patient will benefit from skilled OT to address above impairments and improve overall function.  MODIFICATION OR ASSISTANCE TO COMPLETE EVALUATION: Maximum modification of tasks or assist with assess necessary to complete an evaluation.  OT OCCUPATIONAL PROFILE AND HISTORY: Detailed assessment: Review of records and additional review of physical, cognitive, psychosocial history related to current functional performance.  CLINICAL DECISION MAKING: High - multiple treatment options, significant modification of task necessary  REHAB POTENTIAL: Good for stated goals  EVALUATION COMPLEXITY: High    PLAN: OT FREQUENCY: 1-2x/week  OT DURATION: 12 weeks  PLANNED INTERVENTIONS: self care/ADL training,  therapeutic exercise, therapeutic activity, neuromuscular re-education, manual therapy, passive range of motion, paraffin, moist heat, contrast bath, patient/family education, cognitive remediation/compensation, energy conservation, and coping strategies training  RECOMMENDED OTHER SERVICES: PT, and ST  CONSULTED AND AGREED WITH PLAN OF CARE: Patient and family member/caregiver  PLAN FOR NEXT SESSION: Initiate PROM, and RUE Edema control techniques   Leta Speller, MS, OTR/L  Darleene Cleaver, OT 03/31/2022, 5:09 PM

## 2022-03-31 NOTE — Therapy (Signed)
OUTPATIENT PHYSICAL THERAPY NEURO TREATMENT     Patient Name: Kelly Rollins MRN: [id number] DOB:[birth year], 52 y.o., female Today's Date: 03/31/2022   PCP: Kelly Rossetti, PA-C REFERRING PROVIDER: Elnora Rollins, Kelly Public, PA-C   PT End of Session - 03/31/22 1429     Visit Number 22    Number of Visits 69    Date for PT Re-Evaluation 03/12/22    Authorization Type [employer] Medicare    Authorization Time Period 09/16/21-12/09/21    PT Start Time 1346    PT Stop Time 1422    PT Time Calculation (min) 36 min    Equipment Utilized During Treatment Gait belt    Activity Tolerance Patient tolerated treatment well;Patient limited by fatigue    Behavior During Therapy Kelly Rollins for tasks assessed/performed                        Past Medical History:  Diagnosis Date   ABDOMINAL PAIN OTHER SPECIFIED SITE 02/21/2009   Qualifier: Diagnosis of  By: Kelly Medina MD, Kelly Rollins     ABDOMINAL PAIN-LUQ 04/17/2009   Qualifier: Diagnosis of  By: Kelly Plan MD Kelly Rollins    Anal fissure    Anginal pain ([place])    Asthma    Depression    Diabetes mellitus without complication ([place])    Dyspnea    Dysrhythmia    Headache(784.0)    HEMATOCHEZIA 04/17/2009   Qualifier: Diagnosis of  By: Kelly Plan MD Kelly Rollins    Hyperlipidemia    Hypertension    IBS (irritable bowel syndrome)    Migraine headache 11/06/2008   Qualifier: Diagnosis of  By: Kelly Medina MD, Kelly Rollins     OVARIAN CYST, RIGHT 02/21/2009   Qualifier: Diagnosis of  By: Kelly Medina MD, Kelly Rollins     PALPITATIONS, OCCASIONAL 04/17/2010   Qualifier: Diagnosis of  By: Kelly Medina MD, Kelly Rollins     Pancreatitis 10/14/2017   Past Surgical History:  Procedure Laterality Date   BREAST EXCISIONAL BIOPSY Left 1997   ? left side, done at time of reduction , benign   BREAST REDUCTION SURGERY     bilateral   CESAREAN SECTION     CHOLECYSTECTOMY     COLONOSCOPY     ESOPHAGOGASTRODUODENOSCOPY     ESOPHAGOGASTRODUODENOSCOPY (EGD) WITH PROPOFOL N/A 10/21/2020    Procedure: ESOPHAGOGASTRODUODENOSCOPY (EGD) WITH PROPOFOL;  Surgeon: Kelly Rubenstein, MD;  Location: [facility] ENDOSCOPY;  Service: Endoscopy;  Laterality: N/A;   REDUCTION MAMMAPLASTY Bilateral 1997   VAGINAL HYSTERECTOMY     Patient Active Problem List   Diagnosis Date Noted   Abnormal MRI, lumbar spine (01/29/2020) 02/19/2020   Anxiety due to MRI 01/25/2020   Claustrophobia associated to MRI 01/25/2020   Enthesopathy of knee region (Right) 01/25/2020   Enthesopathy of knee region (Left) 01/25/2020   Lumbar facet hypertrophy 01/25/2020   Lumbosacral radiculopathy at S1 (Left) 01/17/2020   Lumbosacral radiculopathy (S1) (Left) 01/17/2020   Chronic lower extremity pain (Bilateral) (L>R) 01/17/2020   History of pancreatitis 07/18/2019   Obstructive sleep apnea syndrome 01/02/2019   Lumbar facet arthropathy (Multilevel) (Bilateral) 12/14/2018   Osteoarthritis involving multiple joints 11/28/2018   Chronic hip pain (Left) 11/16/2018   Acute hip pain (Left) 11/16/2018   DDD (degenerative disc disease), lumbosacral 11/16/2018   Chronic hip pain (Secondary source of pain) (Bilateral) (L>R) 05/19/2017   Elevated C-reactive protein (CRP) 05/19/2017   Elevated sed rate 05/19/2017   Spondylosis without myelopathy or radiculopathy, lumbosacral region 05/19/2017   Pharmacologic  therapy 05/06/2017   Disorder of skeletal system 05/06/2017   Problems influencing health status 05/06/2017   Chest pain with moderate risk of acute coronary syndrome 04/21/2017   DOE (dyspnea on exertion) 04/21/2017   Intermittent palpitations 04/21/2017   Vitamin D insufficiency 05/04/2016   Depression 02/04/2016   Fatty liver 02/04/2016   Hypertension associated with diabetes ([place]) 02/04/2016   Chronic pain syndrome 01/15/2016   Acute low back pain 05/28/2015   Lumbar transverse process fracture (HCC) (Left L1) (03/11/15) 05/28/2015   Long term current use of opiate analgesic 05/01/2015   Long term prescription  opiate use 05/01/2015   Opiate use (25 MME/Day) 05/01/2015   Encounter for therapeutic drug level monitoring 05/01/2015   Encounter for pain management planning 05/01/2015   Chronic low back pain (Primary Source of Pain) (Bilateral) (L>R) 05/01/2015   Lumbar facet syndrome (Bilateral) (L>R) 05/01/2015   Chronic knee pain [facility] source of pain) (Bilateral) (L>R) 05/01/2015   Lumbar spondylosis 05/01/2015   Neuropathic pain 05/01/2015   Neurogenic pain 05/01/2015   Myofascial pain 05/01/2015   Essential hypertriglyceridemia 04/26/2014   Type 2 diabetes mellitus with hyperglycemia, with long-term current use of insulin ([place]) 04/26/2014   Anxiety 05/15/2010   Obesity 05/15/2010   Hyperlipidemia 04/03/2010   Fatty liver disease 04/02/2010   Pure hypercholesterolemia 03/19/2010   GERD 04/17/2009   Constipation 04/17/2009   TOBACCO ABUSE 11/06/2008    ONSET DATE: January 2023  REFERRING DIAG: C71.9 (ICD-10-CM) - Malignant neoplasm of brain, unspecified  THERAPY DIAG:  Muscle weakness (generalized)  Unsteadiness on feet  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HCC)  Difficulty in walking, not elsewhere classified  Other lack of coordination  Rationale for Evaluation and Treatment Rehabilitation  SUBJECTIVE:                                                                                                                                                                                              SUBJECTIVE STATEMENT:  Pt reports pain level is about a 4/10, reports no stumbles/falls. Pt is a little tired currently.  Pt accompanied by:  Kelly Rollins  PAIN:  Are you having pain? 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 Pt's husband, Kelly Rollins, present with pt. Kelly Rollins provides majority of hx as pt has difficulty with speech  due to Broca's aphasia (working with SLP). Pt reports partial paralysis of R side. She is unable to move her RUE, RLE also affected. Pt ambulates with hemiwalker  in their home for short distances and does not use WC at home. She fatigues quickly with ambulation. Pt has AFO for RLE but unable to determine how to donn it without it hurting her LE. Pt currently with wound on R calf covered by large bandage that they have been treating for at least a month (reports physician aware). In addition to wound on R calf, pt reports R side skin irritation under breast and lateral trunk due to bra underwire. They have since removed underwire. Pt was hospitalized 10 days in June due to UTI. She has had at least 12 falls in the last six months. Reports no injuries with falls, and states "just bumps and bruises." Pt reports she has constant head pain. Pt spouse reports pt has pain throughout her whole right side down to LE. Can have bad cramps in RUE and RLE. Reports no numbness/tingling. Occ dizziness, unable to describe further. Pt sleeps in recliner, wakes up stiff in the morning. PMH per chart includes: Anorexia, dehydration, Adult Failure to thrive, contracture of muscles multiple sites. Pt./caregiver reports recently being hospitalized with a UTI, and dehydration. Extensive hx, please refer to chart for full details.   PAIN: from eval Are you having pain? Yes: NPRS scale: 3/10 Pain location: Top of head, front Pain description: States, "cancer pain" head pain Aggravating factors: unsure Relieving factors: medications  PRECAUTIONS: Fall  WEIGHT BEARING RESTRICTIONS No  FALLS: Has patient fallen in last 6 months? Yes. Number of falls 12  LIVING ENVIRONMENT: information primarily from chart Lives with: lives with their spouse Lives in: House/apartment Stairs: Yes: External: 4 steps; bilateral but cannot reach both Has following equipment at home: Single point cane, Walker - 4 wheeled, Hemi walker, shower  chair, bed side commode, Grab bars, and lift chair  PLOF: Independent  PATIENT GOALS  "Normal function" as much as possible. Improving her balance.   TODAY'S TREATMENT:   TherEx:  Chair assisted hamstring and adductor. DF stretch RLE x several minutes LAQ 10x RLE with TC/VC LAQ LLE with 2# AW 10x2 sets Seated March 2# AW LLE 1x10, no weight RLE 2x10  STS with push through LUE from elevated seat height 5x, 2x, 1x    NMR: Seated balance intervention: LUE reach to L side, across body, forward, followed by abdominal crunch x 4x through  Instruction in postural technique to assist with mobilizing RLE/short-term reduction in tone     PATIENT EDUCATION: Education details: Pt educated throughout session about proper posture and technique with exercises. Improved exercise technique, movement at target joints, use of target muscles after min to mod verbal, visual, tactile cues.   Person educated: pt and her spouse Education method:Explanation, Demonstration, Tactile cues, and Verbal cues, Education comprehension: verbalized understanding, returned demonstration, tactile cues required, and needs further education   HOME EXERCISE PROGRAM:  Pt to perform only as many reps of STS per set as able  Access Code: [email or web address] URL: [email or web address] Date: 11/18/2021 Prepared by: Ricard Dillon  Exercises - Elbow Extension with Anchored Resistance  - 1 x daily - 5 x weekly - 3 sets - 10 reps  Access Code: [email or web address] URL: [email or web address] Date: 10/02/2021 Prepared by: Sande Brothers  Exercises - Seated Hip Flexion March with Ankle Weights  - 1 x daily - 7 x weekly - 3 sets - 10 reps - Seated Long Arc Quad  - 1 x daily - 7 x weekly - 3 sets - 10 reps - Supine Quad Set  - 1 x daily -  7 x weekly - 3 sets - 10 reps - Proper Sit to Stand Technique  - 1 x daily - 7 x weekly - 3 sets - 10 reps  GOALS: Goals reviewed with patient? Yes  SHORT TERM GOALS:  Target date: 01/29/2022   Patient will be independent in home exercise program to improve strength/mobility for better functional independence with ADLs. Baseline: to be initiated next 1-2 sessions; 10/17 previously issued, pt is 11/16: to be advanced as pt has gained strength since previous assessment  Goal status: ONGOING   LONG TERM GOALS: Target date: 03/12/2022   Patient will demo ability to perform at least 1 STS by pushing from [facility] with LUE instead of pulling to stand to improve functional mobility Baseline: Goal changed as pt FOTO tracked by OT, with new goal: pt currently pulls to stand; 11/16: deferred due to pt feeling unwell; 11/22: able to complete with mod assist; 03/03/2022: unable to complete STS with push to stand technique Goal status: ONGOING   2.   Patient (< 23 years old) will be able to perform and complete five times sit to stand test from standard chair height in <30 seconds to indicate increased LE strength and improved balance. Baseline: Pt able to complete only 1 stand, insufficient strength currently to complete full test; 10/17 31 seconds pull to stand with UE, close CGA-min a 11/16: deferred due to pt feeling unwell; 03/03/2022: 30 sec pull to stand technique used Goal status:  PARTIALLY MET  3.  Pt will improve LLE strength by at least 1/2 point on MMT in order to increase ease with transfers and functional mobility. Baseline: LLE grossly 4/5 throughout; 10/17: still generally 4/5 B, however now exhibits AROM with R hip flexion and knee ext; 11/16: 4+/5 LLE, RLE 3-/5, noted improvement however in quads and hip flexors  Goal status: MET  4.  Patient will increase 10 meter walk test to at least 0.80 m/s as to improve gait speed for better community ambulation and to reduce fall risk. Baseline: to be tested next 1-2 visits; 10/17: pt unable to perform 11/16: deferred due to pt feeling unwell; 11/21: 0.17 m/s; 03/03/22: 0.13 m/s with HW Goal status: IN PROGRESS  5.  Pt  will complete STS from standard height chair with UUE assist off armrests and with no greater than min assist to improve ease and safety with transfers. Baseline: mod a x2; 10/17: pulling to stand with LUE CGA 11/16: deferred due to pt feeling unwell; 11/21: currently requires mod a to complete; 03/03/2022: pt attempted indep today but unable to complete Goal status: IN PROGRESS,   6.  Pt will improve RLE strength by at least 1/2 point on MMT in order to increase ease with transfers and functional mobility. Baseline: 11/16: 4+/5 LLE, RLE 3-/5, noted improvement however in quads and hip flexors on R; 03/03/2022: 3-/5 RLE, noted increased tone throughout RLE musculature  Goal status: NEW   ASSESSMENT:  CLINICAL IMPRESSION: Initiated instruction in seated balance interventions. Pt required on more than CGA-min assist, with occ intermittent UE support. Pt fatigued today but was able to complete several reps of STS with pushing through LUE from elevated seat. The pt would benefit from continued skilled PT to address immobility, LE muscle weakness, and pain in order to decrease fall risk and increase functional mobility and QOL.   OBJECTIVE IMPAIRMENTS Abnormal gait, decreased activity tolerance, decreased balance, decreased coordination, decreased endurance, decreased mobility, difficulty walking, decreased ROM, decreased strength, hypomobility, increased edema, increased  fascial restrictions, increased muscle spasms, impaired flexibility, impaired sensation, impaired tone, impaired UE functional use, improper body mechanics, postural dysfunction, and pain.   ACTIVITY LIMITATIONS carrying, lifting, bending, sitting, standing, squatting, stairs, transfers, bed mobility, bathing, toileting, dressing, reach over head, hygiene/grooming, locomotion level, and caring for others  PARTICIPATION LIMITATIONS: meal prep, cleaning, laundry, medication management, personal finances, driving, shopping, community  activity, occupation, and yard work  PERSONAL Education officer, community, Past/current experiences, Sex, and 3+ comorbidities: Anorexia, dehydration, Adult Failure to thrive, contracture of muscles multiple sites, multiple falls, UTI, extensive PMH refer to chart  are also affecting patient's functional outcome.   REHAB POTENTIAL: Fair    CLINICAL DECISION MAKING: Evolving/moderate complexity  EVALUATION COMPLEXITY: High  Rollins: PT FREQUENCY: 2x/week  PT DURATION: 12 weeks  PLANNED INTERVENTIONS: Therapeutic exercises, Therapeutic activity, Neuromuscular re-education, Balance training, Gait training, Patient/Family education, Self Care, Joint mobilization, Joint manipulation, Stair training, Vestibular training, Canalith repositioning, Orthotic/Fit training, DME instructions, Wheelchair mobility training, Spinal mobilization, Cryotherapy, Moist heat, scar mobilization, Splintting, Taping, Manual therapy, and Re-evaluation  Rollins FOR NEXT SESSION:  stretching, strengthening, gait, balance, manual, standing balance, RLE stretching 2:30 PM, 03/31/22   Zollie Pee, PT 03/31/2022, 2:30 PM

## 2022-04-02 ENCOUNTER — Ambulatory Visit: Payer: Medicare Other

## 2022-04-07 ENCOUNTER — Ambulatory Visit: Payer: Medicare Other | Attending: Adult Health Nurse Practitioner

## 2022-04-07 ENCOUNTER — Ambulatory Visit: Payer: Medicare Other

## 2022-04-07 DIAGNOSIS — R262 Difficulty in walking, not elsewhere classified: Secondary | ICD-10-CM | POA: Diagnosis present

## 2022-04-07 DIAGNOSIS — R2681 Unsteadiness on feet: Secondary | ICD-10-CM

## 2022-04-07 DIAGNOSIS — M6281 Muscle weakness (generalized): Secondary | ICD-10-CM | POA: Insufficient documentation

## 2022-04-07 DIAGNOSIS — C719 Malignant neoplasm of brain, unspecified: Secondary | ICD-10-CM | POA: Insufficient documentation

## 2022-04-07 DIAGNOSIS — R278 Other lack of coordination: Secondary | ICD-10-CM | POA: Insufficient documentation

## 2022-04-07 NOTE — Addendum Note (Signed)
Addended by: Zollie Pee on: 04/07/2022 10:05 AM   Modules accepted: Orders

## 2022-04-07 NOTE — Therapy (Signed)
[place] at [facility] [place], [place], [address] Phone: [phone]   Fax:  [phone]  Patient Details  Name: Kelly Rollins MRN: [id number] Date of Birth: 07-08-1970 Referring Provider:  [name],*  Encounter Date: 04/07/2022   No treatment provided/no charges, pt not seen. Pt presented to PT feeling increasingly nauseated. Pt spouse confirms and reports pt has been feeling ill on and off today. Pt requested to not complete PT today due to feeling unwell, and PT agreeable/thinks it is a good idea. PT provided pt with emesis bag.     Zollie Pee, PT 04/07/2022, 1:59 PM  [facility] at [facility] [place], [place], [address] Phone: [phone]   Fax:  [phone]

## 2022-04-08 NOTE — Therapy (Signed)
OUTPATIENT OCCUPATIONAL NEURO TREATMENT NOTE  Patient Name: Kelly Rollins MRN: [id number] DOB:[birth year], 52 y.o., female Today's Date: 04/08/2022  PCP: [name], PA-C REFERRING PROVIDER: [name], Corrine Jeani Hawking, PA-C   OT End of Session - 04/08/22 1607     Visit Number 34    Number of Visits 38    Date for OT Re-Evaluation 04/28/22    Authorization Time Period Reporting period beginning 03/03/22    Progress Note Due on Visit 10    OT Start Time 1300    OT Stop Time 1345    OT Time Calculation (min) 45 min    Equipment Utilized During Treatment wc    Activity Tolerance Patient tolerated treatment well    Behavior During Therapy [facility] for tasks assessed/performed            Past Medical History:  Diagnosis Date   ABDOMINAL PAIN OTHER SPECIFIED SITE 02/21/2009   Qualifier: Diagnosis of  By: Deborra Medina MD, Talia     ABDOMINAL PAIN-LUQ 04/17/2009   Qualifier: Diagnosis of  By: Fuller Plan MD Lamont Snowball T    Anal fissure    Anginal pain ([place])    Asthma    Depression    Diabetes mellitus without complication ([place])    Dyspnea    Dysrhythmia    Headache(784.0)    HEMATOCHEZIA 04/17/2009   Qualifier: Diagnosis of  By: Fuller Plan MD Lamont Snowball T    Hyperlipidemia    Hypertension    IBS (irritable bowel syndrome)    Migraine headache 11/06/2008   Qualifier: Diagnosis of  By: Deborra Medina MD, Talia     OVARIAN CYST, RIGHT 02/21/2009   Qualifier: Diagnosis of  By: Deborra Medina MD, Talia     PALPITATIONS, OCCASIONAL 04/17/2010   Qualifier: Diagnosis of  By: Deborra Medina MD, Talia     Pancreatitis 10/14/2017   Past Surgical History:  Procedure Laterality Date   BREAST EXCISIONAL BIOPSY Left 1997   ? left side, done at time of reduction , benign   BREAST REDUCTION SURGERY     bilateral   CESAREAN SECTION     CHOLECYSTECTOMY     COLONOSCOPY     ESOPHAGOGASTRODUODENOSCOPY     ESOPHAGOGASTRODUODENOSCOPY (EGD) WITH PROPOFOL N/A 10/21/2020   Procedure: ESOPHAGOGASTRODUODENOSCOPY (EGD) WITH  PROPOFOL;  Surgeon: Lesly Rubenstein, MD;  Location: [facility] ENDOSCOPY;  Service: Endoscopy;  Laterality: N/A;   REDUCTION MAMMAPLASTY Bilateral 1997   VAGINAL HYSTERECTOMY     Patient Active Problem List   Diagnosis Date Noted   Abnormal MRI, lumbar spine (01/29/2020) 02/19/2020   Anxiety due to MRI 01/25/2020   Claustrophobia associated to MRI 01/25/2020   Enthesopathy of knee region (Right) 01/25/2020   Enthesopathy of knee region (Left) 01/25/2020   Lumbar facet hypertrophy 01/25/2020   Lumbosacral radiculopathy at S1 (Left) 01/17/2020   Lumbosacral radiculopathy (S1) (Left) 01/17/2020   Chronic lower extremity pain (Bilateral) (L>R) 01/17/2020   History of pancreatitis 07/18/2019   Obstructive sleep apnea syndrome 01/02/2019   Lumbar facet arthropathy (Multilevel) (Bilateral) 12/14/2018   Osteoarthritis involving multiple joints 11/28/2018   Chronic hip pain (Left) 11/16/2018   Acute hip pain (Left) 11/16/2018   DDD (degenerative disc disease), lumbosacral 11/16/2018   Chronic hip pain (Secondary source of pain) (Bilateral) (L>R) 05/19/2017   Elevated C-reactive protein (CRP) 05/19/2017   Elevated sed rate 05/19/2017   Spondylosis without myelopathy or radiculopathy, lumbosacral region 05/19/2017   Pharmacologic therapy 05/06/2017   Disorder of skeletal system 05/06/2017   Problems influencing health status  05/06/2017   Chest pain with moderate risk of acute coronary syndrome 04/21/2017   DOE (dyspnea on exertion) 04/21/2017   Intermittent palpitations 04/21/2017   Vitamin D insufficiency 05/04/2016   Depression 02/04/2016   Fatty liver 02/04/2016   Hypertension associated with diabetes ([place]) 02/04/2016   Chronic pain syndrome 01/15/2016   Acute low back pain 05/28/2015   Lumbar transverse process fracture (HCC) (Left L1) (03/11/15) 05/28/2015   Long term current use of opiate analgesic 05/01/2015   Long term prescription opiate use 05/01/2015   Opiate use (25 MME/Day)  05/01/2015   Encounter for therapeutic drug level monitoring 05/01/2015   Encounter for pain management planning 05/01/2015   Chronic low back pain (Primary Source of Pain) (Bilateral) (L>R) 05/01/2015   Lumbar facet syndrome (Bilateral) (L>R) 05/01/2015   Chronic knee pain [facility] source of pain) (Bilateral) (L>R) 05/01/2015   Lumbar spondylosis 05/01/2015   Neuropathic pain 05/01/2015   Neurogenic pain 05/01/2015   Myofascial pain 05/01/2015   Essential hypertriglyceridemia 04/26/2014   Type 2 diabetes mellitus with hyperglycemia, with long-term current use of insulin ([place]) 04/26/2014   Anxiety 05/15/2010   Obesity 05/15/2010   Hyperlipidemia 04/03/2010   Fatty liver disease 04/02/2010   Pure hypercholesterolemia 03/19/2010   GERD 04/17/2009   Constipation 04/17/2009   TOBACCO ABUSE 11/06/2008   ONSET DATE: 02/06/2021  REFERRING DIAG: Astrocytoma  THERAPY DIAG:  Muscle weakness (generalized)  Other lack of coordination  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HCC)  Rationale for Evaluation and Treatment Rehabilitation  SUBJECTIVE:  SUBJECTIVE STATEMENT: Pt nauseated this afternoon.  Provided pt with hard mint which she felt helped.  Pt accompanied by: significant other  PERTINENT HISTORY:  Pt. Is a 52 y.o. 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 PMHx includes: Anorexia, dehydration, Adult Failure to thrive, contracture of muscles multiple sites. Pt./caregiver reports recently being hospitalized with a UTI, and dehydration.   PRECAUTIONS: Fall  WEIGHT BEARING RESTRICTIONS No  PAIN:  Are you having pain?  Pt. reports 6/10 pain in the entire R side.   FALLS: Has patient fallen in last 6 months? Yes. Number of falls Multiple Falls  LIVING ENVIRONMENT: Lives with: Family Lives in: House/apartment Stairs: Yes: External: 4 steps; bilateral but cannot reach both Has following equipment at home:  Single point cane, Walker - 4 wheeled, Hemi walker, shower chair, bed side commode, and Grab bars Lift chair in the tub, Ama,  PLOF: Independent  PATIENT GOALS: To improve the right UE  OBJECTIVE:   HAND DOMINANCE: Right  ADLs: Transfers/ambulation related to ADLs: Eating: MaxA with cutting food, setting up, Modified I with nondominant Left hand, Silicon Mat  Grooming: Independent  UB Dressing: MaxA  LB Dressing: MaxA Toileting: Uses a BSCommode mInA transfers, MaxA clothing negotiation Bathing: MaxA Tub Shower transfers:  Uses a shower lift chair Equipment:  w/c, BSCommode, cane   IADLs: Shopping: Husband performs Light housekeeping: Max Meal Prep: MaxA Community mobility: Relies on family Medication management:Husband assists Doctor, hospital: Performs together Handwriting: Not legible  MOBILITY STATUS: Hx of falls   ACTIVITY TOLERANCE: Activity tolerance: limited  FUNCTIONAL OUTCOME MEASURES: FOTO: 11.76  UPPER EXTREMITY ROM  : WFL Left AROM/PROM   Passive ROM Right eval Right  08/18/21 Right 09/08/2021 Right 10/30/2021 Right 02/03/22 Right 03/03/22  Shoulder flexion 30   48 15 55 65  Shoulder abduction 40 62 53 45 55 60  Shoulder adduction  Shoulder extension           Shoulder internal rotation           Shoulder external rotation           Elbow flexion 120   110 122 140   Elbow extension -67   -46 -72 -30   Wrist flexion Neutral   Neutral neutral 60   Wrist extension 12   40 25 28   Wrist ulnar deviation           Wrist radial deviation           Wrist pronation           Wrist supination           (Blank rows = not tested)   UPPER EXTREMITY MMT:      MMT Right eval Right 02/03/22 Left eval  Shoulder flexion 0/5 0/5 4+/5  Shoulder abduction 0/5 0/5 4+/5  Shoulder adduction       Shoulder extension       Shoulder internal rotation       Shoulder external rotation       Middle trapezius       Lower trapezius       Elbow flexion  0/5 0/5 4+/5  Elbow extension 0/5 0/5 4+/5  Wrist flexion       Wrist extension 0/5 0/5 4+/5  Wrist ulnar deviation       Wrist radial deviation       Wrist pronation       Wrist supination       (Blank rows = not tested)   FOTO score: 09/08/2021: 11   FOTO score 02/03/22: 11.76  SENSATION: Light touch: WFL  EDEMA:   Wrist: 18.5 cm MCP: 21.5 cm  10/30/2021:  Wrist: 17.5 cm MCPs: 20cm  02/03/22:  Wrist: 17.3 cm MCP: 20.0 cm  MUSCLE TONE: RUE: Severe  COGNITION: Overall cognitive status: Impaired  VISION: Subjective report: Wears glasses Baseline vision: Wears glasses all the time Pt. Reports no changes   PRAXIS: Impaired: Motor planning  TODAY'S TREATMENT:  Moist heat applied to R shoulder and hand used intermittently throughout session for pain management and muscle relaxation.  Therapeutic Exercise: Performed passive stretching throughout the RUE, working to reduce tone, increase muscle relaxation and maximize ROM to ease self care tasks.  Performed passive R scapular depression, scapular abd/add with manual gliding of scapula. Performed R passive shoulder flex, abd with gentle slide on lap pillow.  Performed passive ER/IR with elbow at side, elbow flex/ext, forearm pron/sup, wrist flex/ext, and digit flex/ext.  Instructed pt in self passive shoulder flexion and elbow extension with forward lean, hanging arms between legs with pt seated in wc.  Pt uses L hand over R to perform this stretch with OT providing Min A to gently position RUE to maximize stretch.  Pt performed 3 reps with 20-30 sec hold.    PATIENT EDUCATION: Education details: self PROM for the RUE Person educated: Patient and Spouse Education method: explanation, vc Education comprehension: verbalized/demonstrated understanding, additional educ needed  HOME EXERCISE PROGRAM:  Pt./caregiver education about positioning of the RUE, ROM  GOALS: Goals reviewed with patient? Yes  SHORT TERM GOALS: Target  date: 03/17/22 Pt./caregivers will demonstrate independence with HEPs for UE ROM, and edema control Baseline: 03/03/22: Ongoing; initiated prolonged stretch for R shoulder flexion with pillows in lap, arm resting on top.  Pt to try this at home as she responded well to this.  02/03/22: Caregiver assist for RUE positioning to reduce edema.  Improved from initial eval.  Additional training needed for caregiver PROM throughout the RUE.  10/30/2021: Pt. Edema has improved. Pt. Requires caregiver assist for positioning. 09/08/2021: Pt. Requires assist for positioning, ROM, edema control. Eval: No current HEPs  Goal status: Ongoing  LONG TERM GOALS: Target date: 04/28/22 Pt. Will improve right hand edema by 1 cm at the wrist, and MCPs Baseline: 03/03/22: wrist 17.5 cm, MCPs: 19.6 cm 02/03/22: wrist 17.3 cm, MCPs: 20 cm; 10/30/2021:  wrist: 17.5 cm, MCPs: 20 cm 09/08/2021: Wrist: 17.5 cm, MCPs: 20.5 cm Eval: Wrist: 18.5cm, MCPs: 21.5cm Goal status: Ongoing  2.  Pt. Will improve right shoulder PROM to 90 degrees for improved tolerance to UB ADLs. Baseline: 03/03/22: flexion 65, abd 60; 02/03/22: shoulder flexion 55, abd 55; 10/30/2021: shoulder flexion: 15, abduction: 45 09/08/2021: Right shoulder flexion: 48, abduction: 53 Eval: PROM shoulder flexion: 30 degrees, Abduction: 40 degrees Goal status: Ongoing/ revised  3.  Pt. Will improve right elbow PROM by 10 degrees to prepare for hand to mouth patterns Baseline: 02/03/22: elbow flex 140, ext -30; 10/30/2021: elbow extension -72, flexion: 122 09/08/2021: elbow extension: -46, flexion: 110 Eval:   elbow extension -67, flexion 120 Goal status: achieved  4.  Pt. Will improve PROM right wrist extension to 45 degrees or better to enable WB through the hand during transfers and ADLs. Baseline: 02/03/22: wrist ext 28; 10/30/2021: wrist extension 25 09/08/2021: Wrist extension: 40 Eval: wrist extension 12 degrees, flexion to neutral Goal status: Ongoing/ revised  5.  Pt. Will  tolerate full digit extension in the right hand to promote good skin integrity through the palmar surface of the right hand  Baseline: 03/03/22: Pt tolerated ~80% of full passive digit extension range; 02/03/22: Pt tolerates ROM through ~75% of passive digit extension range; 10/30/2021: Pt. Tolerates ROM through 50% of  digit extension range, occasionally 75% 09/08/2021: Pt. Is able to tolerate PROM through 50% of digit extension in the digits. Eval: Pt. Maintains digits in tight flexion Goal status: Ongoing  6.  Pt. Will require minA UE dressing using one armed dressing techniques. Baseline: 03/03/22: max A pull over shirt; 02/03/22: max A pull over shirt; 10/30/2021: Independent with button down shirt/sweater/cardigan, MaxA pullover shirt.09/08/2021: MaxA Eval: MaxA Goal status: Ongoing   7. Pt will tolerate 15 min or more of supine lying for caregiver to perform PROM throughout the RUE and for benefits of pressure relief and trunk extension.    Baseline: 03/03/22: Pt tolerated 3 nights supine in a bed while vacationing at the beach this month, but has not attempted supine in the home since that trip.  Spouse reported it took about 45 min to get pt comfortably positioned.  02/03/22: pt has not yet attempted at home but was able to tolerate 10 min in supine on mat in clinic.  Goal status: ongoing  ASSESSMENT:  CLINICAL IMPRESSION: Pt struggling with some nausea this afternoon, but improved after sucking on hard mint candy.  Pt and spouse report a lot going on at home right now as pt's mother is still staying with them.  Spouse reports they haven't done much stretching for the RUE.  Pt with poor tolerance for scapular mobility and shoulder flex and abd this date.  OT encouraged pt make sure to consistently use pillow beneath R axilla to promote slight prolonged R arm abd to help with reducing pain during ADLs which require abd (deodorant application, donning shirt, or washing  axilla.  Pt verbalized understanding.   OT reinforced benefits of regular stretching to reduce pain and stiffness and encouraged pt and spouse work their stretching routine into bathing/dressing and other ADLs as able.  Both verbalized understanding.  Will continue to benefit from OT to work towards improving pain, ROM, and to increase engagement of the RUE during ADLs, and IADLs.   PERFORMANCE DEFICITS in functional skills including ADLs, IADLs, coordination, proprioception, sensation, edema, tone, ROM, strength, pain, FMC, GMC, balance, decreased knowledge of use of DME, skin integrity, and UE functional use, cognitive skills including attention, memory, and safety awareness, and psychosocial skills including coping strategies.   IMPAIRMENTS are limiting patient from ADLs, IADLs, education, work, leisure, and social participation.   COMORBIDITIES may have co-morbidities  that affects occupational performance. Patient will benefit from skilled OT to address above impairments and improve overall function.  MODIFICATION OR ASSISTANCE TO COMPLETE EVALUATION: Maximum modification of tasks or assist with assess necessary to complete an evaluation.  OT OCCUPATIONAL PROFILE AND HISTORY: Detailed assessment: Review of records and additional review of physical, cognitive, psychosocial history related to current functional performance.  CLINICAL DECISION MAKING: High - multiple treatment options, significant modification of task necessary  REHAB POTENTIAL: Good for stated goals  EVALUATION COMPLEXITY: High    PLAN: OT FREQUENCY: 1-2x/week  OT DURATION: 12 weeks  PLANNED INTERVENTIONS: self care/ADL training, therapeutic exercise, therapeutic activity, neuromuscular re-education, manual therapy, passive range of motion, paraffin, moist heat, contrast bath, patient/family education, cognitive remediation/compensation, energy conservation, and coping strategies training  RECOMMENDED OTHER SERVICES: PT, and ST  CONSULTED AND AGREED WITH  PLAN OF CARE: Patient and family member/caregiver  PLAN FOR NEXT SESSION: Initiate PROM, and RUE Edema control techniques   Leta Speller, MS, OTR/L  Darleene Cleaver, OT 04/08/2022, 4:09 PM

## 2022-04-09 ENCOUNTER — Ambulatory Visit: Payer: Medicare Other

## 2022-04-14 ENCOUNTER — Ambulatory Visit: Payer: Medicare Other

## 2022-04-16 ENCOUNTER — Ambulatory Visit: Payer: Medicare Other

## 2022-04-21 ENCOUNTER — Ambulatory Visit: Payer: Medicare Other

## 2022-04-21 DIAGNOSIS — M6281 Muscle weakness (generalized): Secondary | ICD-10-CM

## 2022-04-21 DIAGNOSIS — R262 Difficulty in walking, not elsewhere classified: Secondary | ICD-10-CM

## 2022-04-21 DIAGNOSIS — R278 Other lack of coordination: Secondary | ICD-10-CM

## 2022-04-21 DIAGNOSIS — C719 Malignant neoplasm of brain, unspecified: Secondary | ICD-10-CM

## 2022-04-21 DIAGNOSIS — R2681 Unsteadiness on feet: Secondary | ICD-10-CM

## 2022-04-21 NOTE — Therapy (Signed)
OUTPATIENT PHYSICAL THERAPY NEURO TREATMENT     Patient Name: Kelly Rollins MRN: [id number] DOB:1970/08/16, 52 y.o., female Today's Date: 04/21/2022   PCP: Kelly Rossetti, PA-C REFERRING PROVIDER: Elnora Rollins, Kelly Public, PA-C   PT End of Session - 04/21/22 1436     Visit Number 22    Number of Visits 45    Date for PT Re-Evaluation 06/04/22   corrected/addended for recert   Authorization Type [employer]    Authorization Time Period 09/16/21-12/09/21    PT Start Time 1348    PT Stop Time 1427    PT Time Calculation (min) 39 min    Equipment Utilized During Treatment Gait belt    Activity Tolerance Patient limited by fatigue    Behavior During Therapy WFL for tasks assessed/performed                         Past Medical History:  Diagnosis Date   ABDOMINAL PAIN OTHER SPECIFIED SITE 02/21/2009   Qualifier: Diagnosis of  By: Kelly Medina MD, Kelly Rollins     ABDOMINAL PAIN-LUQ 04/17/2009   Qualifier: Diagnosis of  By: Fuller Plan MD Kelly Rollins    Anal fissure    Anginal pain ([place])    Asthma    Depression    Diabetes mellitus without complication ([place])    Dyspnea    Dysrhythmia    Headache(784.0)    HEMATOCHEZIA 04/17/2009   Qualifier: Diagnosis of  By: Fuller Plan MD Kelly Rollins    Hyperlipidemia    Hypertension    IBS (irritable bowel syndrome)    Migraine headache 11/06/2008   Qualifier: Diagnosis of  By: Kelly Medina MD, Kelly Rollins     OVARIAN CYST, RIGHT 02/21/2009   Qualifier: Diagnosis of  By: Kelly Medina MD, Kelly Rollins     PALPITATIONS, OCCASIONAL 04/17/2010   Qualifier: Diagnosis of  By: Kelly Medina MD, Kelly Rollins     Pancreatitis 10/14/2017   Past Surgical History:  Procedure Laterality Date   BREAST EXCISIONAL BIOPSY Left 1997   ? left side, done at time of reduction , benign   BREAST REDUCTION SURGERY     bilateral   CESAREAN SECTION     CHOLECYSTECTOMY     COLONOSCOPY     ESOPHAGOGASTRODUODENOSCOPY     ESOPHAGOGASTRODUODENOSCOPY (EGD) WITH PROPOFOL N/A 10/21/2020    Procedure: ESOPHAGOGASTRODUODENOSCOPY (EGD) WITH PROPOFOL;  Surgeon: Lesly Rubenstein, MD;  Location: [facility] ENDOSCOPY;  Service: Endoscopy;  Laterality: N/A;   REDUCTION MAMMAPLASTY Bilateral 1997   VAGINAL HYSTERECTOMY     Patient Active Problem List   Diagnosis Date Noted   Abnormal MRI, lumbar spine (01/29/2020) 02/19/2020   Anxiety due to MRI 01/25/2020   Claustrophobia associated to MRI 01/25/2020   Enthesopathy of knee region (Right) 01/25/2020   Enthesopathy of knee region (Left) 01/25/2020   Lumbar facet hypertrophy 01/25/2020   Lumbosacral radiculopathy at S1 (Left) 01/17/2020   Lumbosacral radiculopathy (S1) (Left) 01/17/2020   Chronic lower extremity pain (Bilateral) (L>R) 01/17/2020   History of pancreatitis 07/18/2019   Obstructive sleep apnea syndrome 01/02/2019   Lumbar facet arthropathy (Multilevel) (Bilateral) 12/14/2018   Osteoarthritis involving multiple joints 11/28/2018   Chronic hip pain (Left) 11/16/2018   Acute hip pain (Left) 11/16/2018   DDD (degenerative disc disease), lumbosacral 11/16/2018   Chronic hip pain (Secondary source of pain) (Bilateral) (L>R) 05/19/2017   Elevated C-reactive protein (CRP) 05/19/2017   Elevated sed rate 05/19/2017   Spondylosis without myelopathy or radiculopathy, lumbosacral region 05/19/2017  Pharmacologic therapy 05/06/2017   Disorder of skeletal system 05/06/2017   Problems influencing health status 05/06/2017   Chest pain with moderate risk of acute coronary syndrome 04/21/2017   DOE (dyspnea on exertion) 04/21/2017   Intermittent palpitations 04/21/2017   Vitamin D insufficiency 05/04/2016   Depression 02/04/2016   Fatty liver 02/04/2016   Hypertension associated with diabetes ([place]) 02/04/2016   Chronic pain syndrome 01/15/2016   Acute low back pain 05/28/2015   Lumbar transverse process fracture (HCC) (Left L1) (03/11/15) 05/28/2015   Long term current use of opiate analgesic 05/01/2015   Long term prescription  opiate use 05/01/2015   Opiate use (25 MME/Day) 05/01/2015   Encounter for therapeutic drug level monitoring 05/01/2015   Encounter for pain management planning 05/01/2015   Chronic low back pain (Primary Source of Pain) (Bilateral) (L>R) 05/01/2015   Lumbar facet syndrome (Bilateral) (L>R) 05/01/2015   Chronic knee pain [facility] An source of pain) (Bilateral) (L>R) 05/01/2015   Lumbar spondylosis 05/01/2015   Neuropathic pain 05/01/2015   Neurogenic pain 05/01/2015   Myofascial pain 05/01/2015   Essential hypertriglyceridemia 04/26/2014   Type 2 diabetes mellitus with hyperglycemia, with long-term current use of insulin ([place]) 04/26/2014   Anxiety 05/15/2010   Obesity 05/15/2010   Hyperlipidemia 04/03/2010   Fatty liver disease 04/02/2010   Pure hypercholesterolemia 03/19/2010   GERD 04/17/2009   Constipation 04/17/2009   TOBACCO ABUSE 11/06/2008    ONSET DATE: January 2023  REFERRING DIAG: C71.9 (ICD-10-CM) - Malignant neoplasm of brain, unspecified  THERAPY DIAG:  Muscle weakness (generalized)  Unsteadiness on feet  Difficulty in walking, not elsewhere classified  Rationale for Evaluation and Treatment Rehabilitation  SUBJECTIVE:                                                                                                                                                                                              SUBJECTIVE STATEMENT:  Pt reports recent change/development around old RLE wound.   Pt accompanied by:  Kelly Rollins  PAIN:  Are you having pain? 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 Pt's husband, Kelly Rollins, present with pt. Kelly Rollins provides majority of hx as pt has difficulty with speech due to Broca's aphasia (working with SLP). Pt reports partial paralysis of R side. She is unable  to move her RUE, RLE also affected. Pt ambulates with hemiwalker in their home for short distances and does not use WC at home. She fatigues quickly with  ambulation. Pt has AFO for RLE but unable to determine how to donn it without it hurting her LE. Pt currently with wound on R calf covered by large bandage that they have been treating for at least a month (reports physician aware). In addition to wound on R calf, pt reports R side skin irritation under breast and lateral trunk due to bra underwire. They have since removed underwire. Pt was hospitalized 10 days in June due to UTI. She has had at least 12 falls in the last six months. Reports no injuries with falls, and states "just bumps and bruises." Pt reports she has constant head pain. Pt spouse reports pt has pain throughout her whole right side down to LE. Can have bad cramps in RUE and RLE. Reports no numbness/tingling. Occ dizziness, unable to describe further. Pt sleeps in recliner, wakes up stiff in the morning. PMH per chart includes: Anorexia, dehydration, Adult Failure to thrive, contracture of muscles multiple sites. Pt./caregiver reports recently being hospitalized with a UTI, and dehydration. Extensive hx, please refer to chart for full details.   PAIN: from eval Are you having pain? Yes: NPRS scale: 3/10 Pain location: Top of head, front Pain description: States, "cancer pain" head pain Aggravating factors: unsure Relieving factors: medications  PRECAUTIONS: Fall  WEIGHT BEARING RESTRICTIONS No  FALLS: Has patient fallen in last 6 months? Yes. Number of falls 12  LIVING ENVIRONMENT: information primarily from chart Lives with: lives with their spouse Lives in: House/apartment Stairs: Yes: External: 4 steps; bilateral but cannot reach both Has following equipment at home: Single point cane, Walker - 4 wheeled, Hemi walker, shower chair, bed side commode, Grab bars, and lift chair  PLOF: Independent  PATIENT GOALS  "Normal  function" as much as possible. Improving her balance.   TODAY'S TREATMENT:   TherEx:  Nustep lvl 1 x 8 min total. 1 rest break. PT providing min assist throughout for RLE positioning.  STS 5x with min-mod a Gait with HW 2x2-4 ft, close CGA-min assist Standing LTL weight shifts at [facility] x multiple reps each way  Integumentary assessment:  R calf surrounding area of old wound (scabbed over) noted purplish-red color surrounded old wound site extending around ankle, distal calf. Spouse reports relatively new, uncertain if bruising, reports looks better than it did the other day. PT advised pt and spouse to follow-up with physician. They confirm understanding, have follow-up with her physician on Thursday. PT also advised pt spouse to take pictures of area to keep track, instructed pt and spouse to watch out for worsening symptoms, increasing temperature, redness, and if pt starts to feel feverish seek care immediately.      PATIENT EDUCATION: Education details: Pt educated throughout session about proper posture and technique with exercises. Improved exercise technique, movement at target joints, use of target muscles after min to mod verbal, visual, tactile cues.   Person educated: pt and her spouse Education method:Explanation, Demonstration, Tactile cues, and Verbal cues, Education comprehension: verbalized understanding, returned demonstration, tactile cues required, and needs further education   HOME EXERCISE PROGRAM:  Pt to perform only as many reps of STS per set as able  Access Code: [email or web address] URL: [email or web address] Date: 11/18/2021 Prepared by: Ricard Dillon  Exercises - Elbow Extension with Anchored Resistance  - 1 x daily - 5 x weekly - 3 sets - 10 reps  Access Code: [email or web address] URL: [email or web address] Date: 10/02/2021 Prepared by: Sande Brothers  Exercises - Seated Hip Flexion March with Ankle Weights  - 1  x daily - 7 x weekly - 3 sets -  10 reps - Seated Long Arc Quad  - 1 x daily - 7 x weekly - 3 sets - 10 reps - Supine Quad Set  - 1 x daily - 7 x weekly - 3 sets - 10 reps - Proper Sit to Stand Technique  - 1 x daily - 7 x weekly - 3 sets - 10 reps  GOALS: Goals reviewed with patient? Yes  SHORT TERM GOALS: Target date: 01/29/2022   Patient will be independent in home exercise program to improve strength/mobility for better functional independence with ADLs. Baseline: to be initiated next 1-2 sessions; 10/17 previously issued, pt is 11/16: to be advanced as pt has gained strength since previous assessment  Goal status: ONGOING   LONG TERM GOALS: Target date: 03/12/2022   Patient will demo ability to perform at least 1 STS by pushing from [facility] with LUE instead of pulling to stand to improve functional mobility Baseline: Goal changed as pt FOTO tracked by OT, with new goal: pt currently pulls to stand; 11/16: deferred due to pt feeling unwell; 11/22: able to complete with mod assist; 03/03/2022: unable to complete STS with push to stand technique Goal status: ONGOING   2.   Patient (< 24 years old) will be able to perform and complete five times sit to stand test from standard chair height in <30 seconds to indicate increased LE strength and improved balance. Baseline: Pt able to complete only 1 stand, insufficient strength currently to complete full test; 10/17 31 seconds pull to stand with UE, close CGA-min a 11/16: deferred due to pt feeling unwell; 03/03/2022: 30 sec pull to stand technique used Goal status:  PARTIALLY MET  3.  Pt will improve LLE strength by at least 1/2 point on MMT in order to increase ease with transfers and functional mobility. Baseline: LLE grossly 4/5 throughout; 10/17: still generally 4/5 B, however now exhibits AROM with R hip flexion and knee ext; 11/16: 4+/5 LLE, RLE 3-/5, noted improvement however in quads and hip flexors  Goal status: MET  4.  Patient will increase 10 meter walk test to at  least 0.80 m/s as to improve gait speed for better community ambulation and to reduce fall risk. Baseline: to be tested next 1-2 visits; 10/17: pt unable to perform 11/16: deferred due to pt feeling unwell; 11/21: 0.17 m/s; 03/03/22: 0.13 m/s with HW Goal status: IN PROGRESS  5.  Pt will complete STS from standard height chair with UUE assist off armrests and with no greater than min assist to improve ease and safety with transfers. Baseline: mod a x2; 10/17: pulling to stand with LUE CGA 11/16: deferred due to pt feeling unwell; 11/21: currently requires mod a to complete; 03/03/2022: pt attempted indep today but unable to complete Goal status: IN PROGRESS,   6.  Pt will improve RLE strength by at least 1/2 point on MMT in order to increase ease with transfers and functional mobility. Baseline: 11/16: 4+/5 LLE, RLE 3-/5, noted improvement however in quads and hip flexors on R; 03/03/2022: 3-/5 RLE, noted increased tone throughout RLE musculature  Goal status: NEW   ASSESSMENT:  CLINICAL IMPRESSION: Observed change in skin surrounding old RLE wound (wound still scabbed over), PT has instructed pt in signs and symptoms to watch out for, when to seek immediate care, and to follow up with her physician regarding this recent change (she has appointment on Thursday with physician). Pt otherwise tolerates  session well and is able to advance endurance training by completing 8 minutes on nustep without symptoms. The pt would benefit from continued skilled PT to address immobility, LE muscle weakness, and pain in order to decrease fall risk and increase functional mobility and QOL.   OBJECTIVE IMPAIRMENTS Abnormal gait, decreased activity tolerance, decreased balance, decreased coordination, decreased endurance, decreased mobility, difficulty walking, decreased ROM, decreased strength, hypomobility, increased edema, increased fascial restrictions, increased muscle spasms, impaired flexibility, impaired  sensation, impaired tone, impaired UE functional use, improper body mechanics, postural dysfunction, and pain.   ACTIVITY LIMITATIONS carrying, lifting, bending, sitting, standing, squatting, stairs, transfers, bed mobility, bathing, toileting, dressing, reach over head, hygiene/grooming, locomotion level, and caring for others  PARTICIPATION LIMITATIONS: meal prep, cleaning, laundry, medication management, personal finances, driving, shopping, community activity, occupation, and yard work  PERSONAL Education officer, community, Past/current experiences, Sex, and 3+ comorbidities: Anorexia, dehydration, Adult Failure to thrive, contracture of muscles multiple sites, multiple falls, UTI, extensive PMH refer to chart  are also affecting patient's functional outcome.   REHAB POTENTIAL: Fair    CLINICAL DECISION MAKING: Evolving/moderate complexity  EVALUATION COMPLEXITY: High  PLAN: PT FREQUENCY: 2x/week  PT DURATION: 12 weeks  PLANNED INTERVENTIONS: Therapeutic exercises, Therapeutic activity, Neuromuscular re-education, Balance training, Gait training, Patient/Family education, Self Care, Joint mobilization, Joint manipulation, Stair training, Vestibular training, Canalith repositioning, Orthotic/Fit training, DME instructions, Wheelchair mobility training, Spinal mobilization, Cryotherapy, Moist heat, scar mobilization, Splintting, Taping, Manual therapy, and Re-evaluation  PLAN FOR NEXT SESSION:  stretching, strengthening, gait, balance, manual, standing balance, RLE stretching 2:37 PM, 04/21/22   Zollie Pee, PT 04/21/2022, 2:37 PM

## 2022-04-22 NOTE — Therapy (Signed)
OUTPATIENT OCCUPATIONAL NEURO TREATMENT NOTE  Patient Name: Kelly Rollins MRN: [id number] DOB:1970-10-28, 52 y.o., female Today's Date: 04/22/2022  PCP: [name], PA-C REFERRING PROVIDER: [name], Corrine Jeani Hawking, PA-C   OT End of Session - 04/22/22 1329     Visit Number 35    Number of Visits 60    Date for OT Re-Evaluation 04/28/22    Authorization Time Period Reporting period beginning 03/03/22    Progress Note Due on Visit 10    OT Start Time 1300    OT Stop Time 1345    OT Time Calculation (min) 45 min    Equipment Utilized During Treatment wc    Activity Tolerance Patient tolerated treatment well    Behavior During Therapy [facility] for tasks assessed/performed            Past Medical History:  Diagnosis Date   ABDOMINAL PAIN OTHER SPECIFIED SITE 02/21/2009   Qualifier: Diagnosis of  By: Deborra Medina MD, Talia     ABDOMINAL PAIN-LUQ 04/17/2009   Qualifier: Diagnosis of  By: Fuller Plan MD Lamont Snowball T    Anal fissure    Anginal pain ([place])    Asthma    Depression    Diabetes mellitus without complication ([place])    Dyspnea    Dysrhythmia    Headache(784.0)    HEMATOCHEZIA 04/17/2009   Qualifier: Diagnosis of  By: Fuller Plan MD Lamont Snowball T    Hyperlipidemia    Hypertension    IBS (irritable bowel syndrome)    Migraine headache 11/06/2008   Qualifier: Diagnosis of  By: Deborra Medina MD, Talia     OVARIAN CYST, RIGHT 02/21/2009   Qualifier: Diagnosis of  By: Deborra Medina MD, Talia     PALPITATIONS, OCCASIONAL 04/17/2010   Qualifier: Diagnosis of  By: Deborra Medina MD, Talia     Pancreatitis 10/14/2017   Past Surgical History:  Procedure Laterality Date   BREAST EXCISIONAL BIOPSY Left 1997   ? left side, done at time of reduction , benign   BREAST REDUCTION SURGERY     bilateral   CESAREAN SECTION     CHOLECYSTECTOMY     COLONOSCOPY     ESOPHAGOGASTRODUODENOSCOPY     ESOPHAGOGASTRODUODENOSCOPY (EGD) WITH PROPOFOL N/A 10/21/2020   Procedure: ESOPHAGOGASTRODUODENOSCOPY (EGD) WITH  PROPOFOL;  Surgeon: Lesly Rubenstein, MD;  Location: [facility] ENDOSCOPY;  Service: Endoscopy;  Laterality: N/A;   REDUCTION MAMMAPLASTY Bilateral 1997   VAGINAL HYSTERECTOMY     Patient Active Problem List   Diagnosis Date Noted   Abnormal MRI, lumbar spine (01/29/2020) 02/19/2020   Anxiety due to MRI 01/25/2020   Claustrophobia associated to MRI 01/25/2020   Enthesopathy of knee region (Right) 01/25/2020   Enthesopathy of knee region (Left) 01/25/2020   Lumbar facet hypertrophy 01/25/2020   Lumbosacral radiculopathy at S1 (Left) 01/17/2020   Lumbosacral radiculopathy (S1) (Left) 01/17/2020   Chronic lower extremity pain (Bilateral) (L>R) 01/17/2020   History of pancreatitis 07/18/2019   Obstructive sleep apnea syndrome 01/02/2019   Lumbar facet arthropathy (Multilevel) (Bilateral) 12/14/2018   Osteoarthritis involving multiple joints 11/28/2018   Chronic hip pain (Left) 11/16/2018   Acute hip pain (Left) 11/16/2018   DDD (degenerative disc disease), lumbosacral 11/16/2018   Chronic hip pain (Secondary source of pain) (Bilateral) (L>R) 05/19/2017   Elevated C-reactive protein (CRP) 05/19/2017   Elevated sed rate 05/19/2017   Spondylosis without myelopathy or radiculopathy, lumbosacral region 05/19/2017   Pharmacologic therapy 05/06/2017   Disorder of skeletal system 05/06/2017   Problems influencing health status  05/06/2017   Chest pain with moderate risk of acute coronary syndrome 04/21/2017   DOE (dyspnea on exertion) 04/21/2017   Intermittent palpitations 04/21/2017   Vitamin D insufficiency 05/04/2016   Depression 02/04/2016   Fatty liver 02/04/2016   Hypertension associated with diabetes ([place]) 02/04/2016   Chronic pain syndrome 01/15/2016   Acute low back pain 05/28/2015   Lumbar transverse process fracture (HCC) (Left L1) (03/11/15) 05/28/2015   Long term current use of opiate analgesic 05/01/2015   Long term prescription opiate use 05/01/2015   Opiate use (25 MME/Day)  05/01/2015   Encounter for therapeutic drug level monitoring 05/01/2015   Encounter for pain management planning 05/01/2015   Chronic low back pain (Primary Source of Pain) (Bilateral) (L>R) 05/01/2015   Lumbar facet syndrome (Bilateral) (L>R) 05/01/2015   Chronic knee pain [facility] source of pain) (Bilateral) (L>R) 05/01/2015   Lumbar spondylosis 05/01/2015   Neuropathic pain 05/01/2015   Neurogenic pain 05/01/2015   Myofascial pain 05/01/2015   Essential hypertriglyceridemia 04/26/2014   Type 2 diabetes mellitus with hyperglycemia, with long-term current use of insulin ([place]) 04/26/2014   Anxiety 05/15/2010   Obesity 05/15/2010   Hyperlipidemia 04/03/2010   Fatty liver disease 04/02/2010   Pure hypercholesterolemia 03/19/2010   GERD 04/17/2009   Constipation 04/17/2009   TOBACCO ABUSE 11/06/2008   ONSET DATE: 02/06/2021  REFERRING DIAG: Astrocytoma  THERAPY DIAG:  Muscle weakness (generalized)  Other lack of coordination  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HCC)  Rationale for Evaluation and Treatment Rehabilitation  SUBJECTIVE:  SUBJECTIVE STATEMENT:  Pt reports that her daughter had her baby so pt has a new granddaughter. Pt accompanied by: significant other  PERTINENT HISTORY:  Pt. Is a 52 y.o. 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 PMHx includes: Anorexia, dehydration, Adult Failure to thrive, contracture of muscles multiple sites. Pt./caregiver reports recently being hospitalized with a UTI, and dehydration.   PRECAUTIONS: Fall  WEIGHT BEARING RESTRICTIONS No  PAIN:  Are you having pain?  Pt. reports 6/10 pain in the entire R side.   FALLS: Has patient fallen in last 6 months? Yes. Number of falls Multiple Falls  LIVING ENVIRONMENT: Lives with: Family Lives in: House/apartment Stairs: Yes: External: 4 steps; bilateral but cannot reach both Has following equipment at home: Single  point cane, Walker - 4 wheeled, Hemi walker, shower chair, bed side commode, and Grab bars Lift chair in the tub, [place],  PLOF: Independent  PATIENT GOALS: To improve the right UE  OBJECTIVE:   HAND DOMINANCE: Right  ADLs: Transfers/ambulation related to ADLs: Eating: MaxA with cutting food, setting up, Modified I with nondominant Left hand, [place] Mat  Grooming: Independent  UB Dressing: MaxA  LB Dressing: MaxA Toileting: Uses a BSCommode mInA transfers, MaxA clothing negotiation Bathing: MaxA Tub Shower transfers:  Uses a shower lift chair Equipment:  w/c, BSCommode, cane   IADLs: Shopping: Husband performs Light housekeeping: Max Meal Prep: MaxA Community mobility: Relies on family Medication management:Husband assists Doctor, hospital: Performs together Handwriting: Not legible  MOBILITY STATUS: Hx of falls   ACTIVITY TOLERANCE: Activity tolerance: limited  FUNCTIONAL OUTCOME MEASURES: FOTO: 11.76  UPPER EXTREMITY ROM  : WFL Left AROM/PROM   Passive ROM Right eval Right  08/18/21 Right 09/08/2021 Right 10/30/2021 Right 02/03/22 Right 03/03/22  Shoulder flexion 30   48 15 55 65  Shoulder abduction 40 62 53 45 55 60  Shoulder adduction  Shoulder extension           Shoulder internal rotation           Shoulder external rotation           Elbow flexion 120   110 122 140   Elbow extension -67   -46 -72 -30   Wrist flexion Neutral   Neutral neutral 60   Wrist extension 12   40 25 28   Wrist ulnar deviation           Wrist radial deviation           Wrist pronation           Wrist supination           (Blank rows = not tested)   UPPER EXTREMITY MMT:      MMT Right eval Right 02/03/22 Left eval  Shoulder flexion 0/5 0/5 4+/5  Shoulder abduction 0/5 0/5 4+/5  Shoulder adduction       Shoulder extension       Shoulder internal rotation       Shoulder external rotation       Middle trapezius       Lower trapezius       Elbow flexion 0/5  0/5 4+/5  Elbow extension 0/5 0/5 4+/5  Wrist flexion       Wrist extension 0/5 0/5 4+/5  Wrist ulnar deviation       Wrist radial deviation       Wrist pronation       Wrist supination       (Blank rows = not tested)   FOTO score: 09/08/2021: 11   FOTO score 02/03/22: 11.76  SENSATION: Light touch: WFL  EDEMA:   Wrist: 18.5 cm MCP: 21.5 cm  10/30/2021:  Wrist: 17.5 cm MCPs: 20cm  02/03/22:  Wrist: 17.3 cm MCP: 20.0 cm  MUSCLE TONE: RUE: Severe  COGNITION: Overall cognitive status: Impaired  VISION: Subjective report: Wears glasses Baseline vision: Wears glasses all the time Pt. Reports no changes   PRAXIS: Impaired: Motor planning  TODAY'S TREATMENT:  Moist heat applied to R shoulder and hand used intermittently throughout session for pain management and muscle relaxation.  Therapeutic Exercise: Performed passive stretching throughout the RUE, working to reduce tone, increase muscle relaxation and maximize ROM to ease self care tasks.  Performed passive R scapular depression, scapular abd/add with manual gliding of scapula. Performed R passive shoulder flex, abd with gentle slide on lap pillow.  Performed passive ER/IR with elbow at side, elbow flex/ext, forearm pron/sup, wrist flex/ext, and digit flex/ext.  Instructed pt table slides and performed reps of shoulder flexion, horiz abd/add, and elbow flex/ext.  OT assisted with all slides using towel on table beneath RUE and assisted to initiate and achieve all motions noted above; trace movements noted from the shoulder and bicep.  PATIENT EDUCATION: Education details: Table slides, AAROM for the RUE Person educated: Patient and Spouse Education method: explanation, vc Education comprehension: verbalized/demonstrated understanding, additional educ needed  HOME EXERCISE PROGRAM:  Pt./caregiver education about positioning of the RUE, ROM  GOALS: Goals reviewed with patient? Yes  SHORT TERM GOALS: Target date:  03/17/22 Pt./caregivers will demonstrate independence with HEPs for UE ROM, and edema control Baseline: 03/03/22: Ongoing; initiated prolonged stretch for R shoulder flexion with pillows in lap, arm resting on top.  Pt to try this at home as she responded well to this.  02/03/22: Caregiver assist for RUE positioning to reduce edema.  Improved from initial eval.  Additional training needed for caregiver PROM throughout the RUE.  10/30/2021: Pt. Edema has improved. Pt. Requires caregiver assist for positioning. 09/08/2021: Pt. Requires assist for positioning, ROM, edema control. Eval: No current HEPs  Goal status: Ongoing  LONG TERM GOALS: Target date: 04/28/22 Pt. Will improve right hand edema by 1 cm at the wrist, and MCPs Baseline: 03/03/22: wrist 17.5 cm, MCPs: 19.6 cm 02/03/22: wrist 17.3 cm, MCPs: 20 cm; 10/30/2021:  wrist: 17.5 cm, MCPs: 20 cm 09/08/2021: Wrist: 17.5 cm, MCPs: 20.5 cm Eval: Wrist: 18.5cm, MCPs: 21.5cm Goal status: Ongoing  2.  Pt. Will improve right shoulder PROM to 90 degrees for improved tolerance to UB ADLs. Baseline: 03/03/22: flexion 65, abd 60; 02/03/22: shoulder flexion 55, abd 55; 10/30/2021: shoulder flexion: 15, abduction: 45 09/08/2021: Right shoulder flexion: 48, abduction: 53 Eval: PROM shoulder flexion: 30 degrees, Abduction: 40 degrees Goal status: Ongoing/ revised  3.  Pt. Will improve right elbow PROM by 10 degrees to prepare for hand to mouth patterns Baseline: 02/03/22: elbow flex 140, ext -30; 10/30/2021: elbow extension -72, flexion: 122 09/08/2021: elbow extension: -46, flexion: 110 Eval:   elbow extension -67, flexion 120 Goal status: achieved  4.  Pt. Will improve PROM right wrist extension to 45 degrees or better to enable WB through the hand during transfers and ADLs. Baseline: 02/03/22: wrist ext 28; 10/30/2021: wrist extension 25 09/08/2021: Wrist extension: 40 Eval: wrist extension 12 degrees, flexion to neutral Goal status: Ongoing/ revised  5.  Pt. Will tolerate full  digit extension in the right hand to promote good skin integrity through the palmar surface of the right hand  Baseline: 03/03/22: Pt tolerated ~80% of full passive digit extension range; 02/03/22: Pt tolerates ROM through ~75% of passive digit extension range; 10/30/2021: Pt. Tolerates ROM through 50% of  digit extension range, occasionally 75% 09/08/2021: Pt. Is able to tolerate PROM through 50% of digit extension in the digits. Eval: Pt. Maintains digits in tight flexion Goal status: Ongoing  6.  Pt. Will require minA UE dressing using one armed dressing techniques. Baseline: 03/03/22: max A pull over shirt; 02/03/22: max A pull over shirt; 10/30/2021: Independent with button down shirt/sweater/cardigan, MaxA pullover shirt.09/08/2021: MaxA Eval: MaxA Goal status: Ongoing   7. Pt will tolerate 15 min or more of supine lying for caregiver to perform PROM throughout the RUE and for benefits of pressure relief and trunk extension.    Baseline: 03/03/22: Pt tolerated 3 nights supine in a bed while vacationing at the beach this month, but has not attempted supine in the home since that trip.  Spouse reported it took about 45 min to get pt comfortably positioned.  02/03/22: pt has not yet attempted at home but was able to tolerate 10 min in supine on mat in clinic.  Goal status: ongoing  ASSESSMENT:  CLINICAL IMPRESSION: Pt was excited to report that she moved her R arm the other day while she was at home.  Noted some trace movements from the R shoulder and intermittently at the biceps today.  Focused on table slides to initiate movement on the RUE and reinforced continued passive stretching to be done at home on a daily basis in order to maximize potential for active movement, as pt continues to be very rigid throughout the RUE.  Max-total A for table slides on the RUE.  Instructed pt on working to reposition the RUE at home, moving arm on/off her lap or an arm rest of her  chair to facilitate active movement.  Pt  verbalized understanding.  Will continue to benefit from OT to work towards improving pain, ROM, and to increase engagement of the RUE during ADLs, and IADLs.   PERFORMANCE DEFICITS in functional skills including ADLs, IADLs, coordination, proprioception, sensation, edema, tone, ROM, strength, pain, FMC, GMC, balance, decreased knowledge of use of DME, skin integrity, and UE functional use, cognitive skills including attention, memory, and safety awareness, and psychosocial skills including coping strategies.   IMPAIRMENTS are limiting patient from ADLs, IADLs, education, work, leisure, and social participation.   COMORBIDITIES may have co-morbidities  that affects occupational performance. Patient will benefit from skilled OT to address above impairments and improve overall function.  MODIFICATION OR ASSISTANCE TO COMPLETE EVALUATION: Maximum modification of tasks or assist with assess necessary to complete an evaluation.  OT OCCUPATIONAL PROFILE AND HISTORY: Detailed assessment: Review of records and additional review of physical, cognitive, psychosocial history related to current functional performance.  CLINICAL DECISION MAKING: High - multiple treatment options, significant modification of task necessary  REHAB POTENTIAL: Good for stated goals  EVALUATION COMPLEXITY: High    PLAN: OT FREQUENCY: 1-2x/week  OT DURATION: 12 weeks  PLANNED INTERVENTIONS: self care/ADL training, therapeutic exercise, therapeutic activity, neuromuscular re-education, manual therapy, passive range of motion, paraffin, moist heat, contrast bath, patient/family education, cognitive remediation/compensation, energy conservation, and coping strategies training  RECOMMENDED OTHER SERVICES: PT, and ST  CONSULTED AND AGREED WITH PLAN OF CARE: Patient and family member/caregiver  PLAN FOR NEXT SESSION: Initiate PROM, and RUE Edema control techniques   Leta Speller, MS, OTR/L  Darleene Cleaver,  OT 04/22/2022, 1:30 PM

## 2022-04-23 ENCOUNTER — Ambulatory Visit: Payer: Medicare Other

## 2022-04-27 ENCOUNTER — Ambulatory Visit: Payer: Medicare Other

## 2022-04-28 ENCOUNTER — Ambulatory Visit: Payer: Medicare Other

## 2022-04-28 DIAGNOSIS — R278 Other lack of coordination: Secondary | ICD-10-CM

## 2022-04-28 DIAGNOSIS — M6281 Muscle weakness (generalized): Secondary | ICD-10-CM | POA: Diagnosis not present

## 2022-04-28 DIAGNOSIS — C719 Malignant neoplasm of brain, unspecified: Secondary | ICD-10-CM

## 2022-04-28 DIAGNOSIS — R2681 Unsteadiness on feet: Secondary | ICD-10-CM

## 2022-04-28 DIAGNOSIS — R262 Difficulty in walking, not elsewhere classified: Secondary | ICD-10-CM

## 2022-04-28 NOTE — Therapy (Signed)
OUTPATIENT OCCUPATIONAL NEURO TREATMENT NOTE  Patient Name: Kelly Rollins MRN: [id number] DOB:[birth year], 52 y.o., female 54 Date: 04/28/2022  PCP: [name], PA-C REFERRING PROVIDER: [name], Corrine Jeani Hawking, PA-C    Past Medical History:  Diagnosis Date   ABDOMINAL PAIN OTHER SPECIFIED SITE 02/21/2009   Qualifier: Diagnosis of  By: Deborra Medina MD, Talia     ABDOMINAL PAIN-LUQ 04/17/2009   Qualifier: Diagnosis of  By: Fuller Plan MD Lamont Snowball T    Anal fissure    Anginal pain ([place])    Asthma    Depression    Diabetes mellitus without complication ([place])    Dyspnea    Dysrhythmia    Headache(784.0)    HEMATOCHEZIA 04/17/2009   Qualifier: Diagnosis of  By: Fuller Plan MD Lamont Snowball T    Hyperlipidemia    Hypertension    IBS (irritable bowel syndrome)    Migraine headache 11/06/2008   Qualifier: Diagnosis of  By: Deborra Medina MD, Talia     OVARIAN CYST, RIGHT 02/21/2009   Qualifier: Diagnosis of  By: Deborra Medina MD, Talia     PALPITATIONS, OCCASIONAL 04/17/2010   Qualifier: Diagnosis of  By: Deborra Medina MD, Talia     Pancreatitis 10/14/2017   Past Surgical History:  Procedure Laterality Date   BREAST EXCISIONAL BIOPSY Left 1997   ? left side, done at time of reduction , benign   BREAST REDUCTION SURGERY     bilateral   CESAREAN SECTION     CHOLECYSTECTOMY     COLONOSCOPY     ESOPHAGOGASTRODUODENOSCOPY     ESOPHAGOGASTRODUODENOSCOPY (EGD) WITH PROPOFOL N/A 10/21/2020   Procedure: ESOPHAGOGASTRODUODENOSCOPY (EGD) WITH PROPOFOL;  Surgeon: Lesly Rubenstein, MD;  Location: [facility] ENDOSCOPY;  Service: Endoscopy;  Laterality: N/A;   REDUCTION MAMMAPLASTY Bilateral 1997   VAGINAL HYSTERECTOMY     Patient Active Problem List   Diagnosis Date Noted   Abnormal MRI, lumbar spine (01/29/2020) 02/19/2020   Anxiety due to MRI 01/25/2020   Claustrophobia associated to MRI 01/25/2020   Enthesopathy of knee region (Right) 01/25/2020   Enthesopathy of knee region (Left) 01/25/2020   Lumbar  facet hypertrophy 01/25/2020   Lumbosacral radiculopathy at S1 (Left) 01/17/2020   Lumbosacral radiculopathy (S1) (Left) 01/17/2020   Chronic lower extremity pain (Bilateral) (L>R) 01/17/2020   History of pancreatitis 07/18/2019   Obstructive sleep apnea syndrome 01/02/2019   Lumbar facet arthropathy (Multilevel) (Bilateral) 12/14/2018   Osteoarthritis involving multiple joints 11/28/2018   Chronic hip pain (Left) 11/16/2018   Acute hip pain (Left) 11/16/2018   DDD (degenerative disc disease), lumbosacral 11/16/2018   Chronic hip pain (Secondary source of pain) (Bilateral) (L>R) 05/19/2017   Elevated C-reactive protein (CRP) 05/19/2017   Elevated sed rate 05/19/2017   Spondylosis without myelopathy or radiculopathy, lumbosacral region 05/19/2017   Pharmacologic therapy 05/06/2017   Disorder of skeletal system 05/06/2017   Problems influencing health status 05/06/2017   Chest pain with moderate risk of acute coronary syndrome 04/21/2017   DOE (dyspnea on exertion) 04/21/2017   Intermittent palpitations 04/21/2017   Vitamin D insufficiency 05/04/2016   Depression 02/04/2016   Fatty liver 02/04/2016   Hypertension associated with diabetes ([place]) 02/04/2016   Chronic pain syndrome 01/15/2016   Acute low back pain 05/28/2015   Lumbar transverse process fracture ([place]) (Left L1) (03/11/15) 05/28/2015   Long term current use of opiate analgesic 05/01/2015   Long term prescription opiate use 05/01/2015   Opiate use (25 MME/Day) 05/01/2015   Encounter for therapeutic drug level monitoring 05/01/2015  Encounter for pain management planning 05/01/2015   Chronic low back pain (Primary Source of Pain) (Bilateral) (L>R) 05/01/2015   Lumbar facet syndrome (Bilateral) (L>R) 05/01/2015   Chronic knee pain [facility] source of pain) (Bilateral) (L>R) 05/01/2015   Lumbar spondylosis 05/01/2015   Neuropathic pain 05/01/2015   Neurogenic pain 05/01/2015   Myofascial pain 05/01/2015   Essential  hypertriglyceridemia 04/26/2014   Type 2 diabetes mellitus with hyperglycemia, with long-term current use of insulin ([place]) 04/26/2014   Anxiety 05/15/2010   Obesity 05/15/2010   Hyperlipidemia 04/03/2010   Fatty liver disease 04/02/2010   Pure hypercholesterolemia 03/19/2010   GERD 04/17/2009   Constipation 04/17/2009   TOBACCO ABUSE 11/06/2008   ONSET DATE: 02/06/2021  REFERRING DIAG: Astrocytoma  THERAPY DIAG:  No diagnosis found.  Rationale for Evaluation and Treatment Rehabilitation  SUBJECTIVE:  SUBJECTIVE STATEMENT:  Pt reports that her daughter had her baby so pt has a new granddaughter. Pt accompanied by: significant other  PERTINENT HISTORY:  Pt. Is a 52 y.o. 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 PMHx includes: Anorexia, dehydration, Adult Failure to thrive, contracture of muscles multiple sites. Pt./caregiver reports recently being hospitalized with a UTI, and dehydration.   PRECAUTIONS: Fall  WEIGHT BEARING RESTRICTIONS No  PAIN:  Are you having pain?  Pt. reports 6/10 pain in the entire R side.   FALLS: Has patient fallen in last 6 months? Yes. Number of falls Multiple Falls  LIVING ENVIRONMENT: Lives with: Family Lives in: House/apartment Stairs: Yes: External: 4 steps; bilateral but cannot reach both Has following equipment at home: Single point cane, Walker - 4 wheeled, Hemi walker, shower chair, bed side commode, and Grab bars Lift chair in the tub, [place],  PLOF: Independent  PATIENT GOALS: To improve the right UE  OBJECTIVE:   HAND DOMINANCE: Right  ADLs: Transfers/ambulation related to ADLs: Eating: MaxA with cutting food, setting up, Modified I with nondominant Left hand, [place]  Grooming: Independent  UB Dressing: MaxA  LB Dressing: MaxA Toileting: Uses a BSCommode mInA transfers, MaxA clothing negotiation Bathing: MaxA Tub Shower transfers:   Uses a shower lift chair Equipment:  w/c, BSCommode, cane   IADLs: Shopping: Husband performs Light housekeeping: Max Meal Prep: MaxA Community mobility: Relies on family Medication management:Husband assists Doctor, hospital: Performs together Handwriting: Not legible  MOBILITY STATUS: Hx of falls   ACTIVITY TOLERANCE: Activity tolerance: limited  FUNCTIONAL OUTCOME MEASURES: FOTO: 11.76  UPPER EXTREMITY ROM  : WFL Left AROM/PROM   Passive ROM Right eval Right  08/18/21 Right 09/08/2021 Right 10/30/2021 Right 02/03/22 Right 03/03/22  Shoulder flexion 30   48 15 55 65  Shoulder abduction 40 62 53 45 55 60  Shoulder adduction           Shoulder extension           Shoulder internal rotation           Shoulder external rotation           Elbow flexion 120   110 122 140   Elbow extension -67   -46 -72 -30   Wrist flexion Neutral   Neutral neutral 60   Wrist extension 12   40 25 28   Wrist ulnar deviation           Wrist radial deviation           Wrist pronation  Wrist supination           (Blank rows = not tested)   UPPER EXTREMITY MMT:      MMT Right eval Right 02/03/22 Left eval  Shoulder flexion 0/5 0/5 4+/5  Shoulder abduction 0/5 0/5 4+/5  Shoulder adduction       Shoulder extension       Shoulder internal rotation       Shoulder external rotation       Middle trapezius       Lower trapezius       Elbow flexion 0/5 0/5 4+/5  Elbow extension 0/5 0/5 4+/5  Wrist flexion       Wrist extension 0/5 0/5 4+/5  Wrist ulnar deviation       Wrist radial deviation       Wrist pronation       Wrist supination       (Blank rows = not tested)   FOTO score: 09/08/2021: 11   FOTO score 02/03/22: 11.76  SENSATION: Light touch: WFL  EDEMA:   Wrist: 18.5 cm MCP: 21.5 cm  10/30/2021:  Wrist: 17.5 cm MCPs: 20cm  02/03/22:  Wrist: 17.3 cm MCP: 20.0 cm  MUSCLE TONE: RUE: Severe  COGNITION: Overall cognitive status: Impaired  VISION: Subjective  report: Wears glasses Baseline vision: Wears glasses all the time Pt. Reports no changes   PRAXIS: Impaired: Motor planning  TODAY'S TREATMENT:  Moist heat applied to R shoulder and hand used intermittently throughout session for pain management and muscle relaxation.  Therapeutic Exercise: Performed passive stretching throughout the RUE, working to reduce tone, increase muscle relaxation and maximize ROM to ease self care tasks.  Performed passive R scapular depression, scapular abd/add with manual gliding of scapula. Performed R passive shoulder flex, abd with gentle slide on lap pillow.  Performed passive ER/IR with elbow at side, elbow flex/ext, forearm pron/sup, wrist flex/ext, and digit flex/ext.  Instructed pt table slides and performed reps of shoulder flexion, horiz abd/add, and elbow flex/ext.  OT assisted with all slides using towel on table beneath RUE and assisted to initiate and achieve all motions noted above; trace movements noted from the shoulder and bicep.  PATIENT EDUCATION: Education details: Table slides, AAROM for the RUE Person educated: Patient and Spouse Education method: explanation, vc Education comprehension: verbalized/demonstrated understanding, additional educ needed  HOME EXERCISE PROGRAM:  Pt./caregiver education about positioning of the RUE, ROM  GOALS: Goals reviewed with patient? Yes  SHORT TERM GOALS: Target date: 03/17/22 Pt./caregivers Rollins demonstrate independence with HEPs for UE ROM, and edema control Baseline: 03/03/22: Ongoing; initiated prolonged stretch for R shoulder flexion with pillows in lap, arm resting on top.  Pt to try this at home as she responded well to this.  02/03/22: Caregiver assist for RUE positioning to reduce edema.  Improved from initial eval.  Additional training needed for caregiver PROM throughout the RUE.  10/30/2021: Pt. Edema has improved. Pt. Requires caregiver assist for positioning. 09/08/2021: Pt. Requires assist for  positioning, ROM, edema control. Eval: No current HEPs  Goal status: Ongoing  LONG TERM GOALS: Target date: 04/28/22 Pt. Rollins improve right hand edema by 1 cm at the wrist, and MCPs Baseline: 03/03/22: wrist 17.5 cm, MCPs: 19.6 cm 02/03/22: wrist 17.3 cm, MCPs: 20 cm; 10/30/2021:  wrist: 17.5 cm, MCPs: 20 cm 09/08/2021: Wrist: 17.5 cm, MCPs: 20.5 cm Eval: Wrist: 18.5cm, MCPs: 21.5cm; 04/28/22: 17.0 cm  Goal status: Ongoing  2.  Pt. Rollins improve right shoulder PROM to  90 degrees for improved tolerance to UB ADLs. Baseline: 04/28/22: flexion 45, abd 65; 03/03/22: flexion 65, abd 60; 02/03/22: shoulder flexion 55, abd 55; 10/30/2021: shoulder flexion: 15, abduction: 45 09/08/2021: Right shoulder flexion: 48, abduction: 53 Eval: PROM shoulder flexion: 30 degrees, Abduction: 40 degrees Goal status: Ongoing/ revised  3.  Pt. Rollins improve right elbow PROM by 10 degrees to prepare for hand to mouth patterns Baseline: 04/28/22: flex 125, ext -151/2/24: elbow flex 140, ext -30; 10/30/2021: elbow extension -72, flexion: 122 09/08/2021: elbow extension: -46, flexion: 110 Eval:   elbow extension -67, flexion 120 Goal status: achieved  4.  Pt. Rollins improve PROM right wrist extension to 45 degrees or better to enable WB through the hand during transfers and ADLs. Baseline: 04/28/22: 30, 02/03/22: wrist ext 28; 10/30/2021: wrist extension 25 09/08/2021: Wrist extension: 40 Eval: wrist extension 12 degrees, flexion to neutral Goal status: Ongoing/ revised  5.  Pt. Rollins tolerate full digit extension in the right hand to promote good skin integrity through the palmar surface of the right hand  Baseline: 03/03/22: Pt tolerated ~80% of full passive digit extension range; 02/03/22: Pt tolerates ROM through ~75% of passive digit extension range; 10/30/2021: Pt. Tolerates ROM through 50% of  digit extension range, occasionally 75% 09/08/2021: Pt. Is able to tolerate PROM through 50% of digit extension in the digits. Eval: Pt. Maintains digits  in tight flexion Goal status: Ongoing  6.  Pt. Rollins require minA UE dressing using one armed dressing techniques. Baseline: 03/03/22: max A pull over shirt; 02/03/22: max A pull over shirt; 10/30/2021: Independent with button down shirt/sweater/cardigan, MaxA pullover shirt.09/08/2021: MaxA Eval: MaxA Goal status: Ongoing   7. Pt Rollins tolerate 15 min or more of supine lying for caregiver to perform PROM throughout the RUE and for benefits of pressure relief and trunk extension.    Baseline: 03/03/22: Pt tolerated 3 nights supine in a bed while vacationing at the beach this month, but has not attempted supine in the home since that trip.  Spouse reported it took about 45 min to get pt comfortably positioned.  02/03/22: pt has not yet attempted at home but was able to tolerate 10 min in supine on mat in clinic.  Goal status: ongoing  ASSESSMENT:  CLINICAL IMPRESSION: Pt was excited to report that she moved her R arm the other day while she was at home.  Noted some trace movements from the R shoulder and intermittently at the biceps today.  Focused on table slides to initiate movement on the RUE and reinforced continued passive stretching to be done at home on a daily basis in order to maximize potential for active movement, as pt continues to be very rigid throughout the RUE.  Max-total A for table slides on the RUE.  Instructed pt on working to reposition the RUE at home, moving arm on/off her lap or an arm rest of her chair to facilitate active movement.  Pt verbalized understanding.  Rollins continue to benefit from OT to work towards improving pain, ROM, and to increase engagement of the RUE during ADLs, and IADLs.   PERFORMANCE DEFICITS in functional skills including ADLs, IADLs, coordination, proprioception, sensation, edema, tone, ROM, strength, pain, FMC, GMC, balance, decreased knowledge of use of DME, skin integrity, and UE functional use, cognitive skills including attention, memory, and safety  awareness, and psychosocial skills including coping strategies.   IMPAIRMENTS are limiting patient from ADLs, IADLs, education, work, leisure, and social participation.   COMORBIDITIES may have  co-morbidities  that affects occupational performance. Patient Rollins benefit from skilled OT to address above impairments and improve overall function.  MODIFICATION OR ASSISTANCE TO COMPLETE EVALUATION: Maximum modification of tasks or assist with assess necessary to complete an evaluation.  OT OCCUPATIONAL PROFILE AND HISTORY: Detailed assessment: Review of records and additional review of physical, cognitive, psychosocial history related to current functional performance.  CLINICAL DECISION MAKING: High - multiple treatment options, significant modification of task necessary  REHAB POTENTIAL: Good for stated goals  EVALUATION COMPLEXITY: High    PLAN: OT FREQUENCY: 1-2x/week  OT DURATION: 12 weeks  PLANNED INTERVENTIONS: self care/ADL training, therapeutic exercise, therapeutic activity, neuromuscular re-education, manual therapy, passive range of motion, paraffin, moist heat, contrast bath, patient/family education, cognitive remediation/compensation, energy conservation, and coping strategies training  RECOMMENDED OTHER SERVICES: PT, and ST  CONSULTED AND AGREED WITH PLAN OF CARE: Patient and family member/caregiver  PLAN FOR NEXT SESSION: Initiate PROM, and RUE Edema control techniques   Leta Speller, MS, OTR/L  Darleene Cleaver, OT 04/28/2022, 2:55 PM      -

## 2022-04-28 NOTE — Therapy (Signed)
OUTPATIENT PHYSICAL THERAPY NEURO TREATMENT     Patient Name: Kelly Rollins MRN: [id number] DOB:1970-06-21, 52 y.o., female Today's Date: 04/28/2022   PCP: [name], PA-C REFERRING PROVIDER: [name], Bonnell Public, PA-C   PT End of Session - 04/28/22 1529     Visit Number 23    Number of Visits 45    Date for PT Re-Evaluation 06/04/22   corrected/addended for recert   Authorization Type [employer] Medicare    Authorization Time Period 09/16/21-12/09/21    PT Start Time 1348    PT Stop Time 1419    PT Time Calculation (min) 31 min    Equipment Utilized During Treatment Gait belt    Activity Tolerance Patient limited by fatigue    Behavior During Therapy WFL for tasks assessed/performed                         Past Medical History:  Diagnosis Date   ABDOMINAL PAIN OTHER SPECIFIED SITE 02/21/2009   Qualifier: Diagnosis of  By: Deborra Medina MD, Talia     ABDOMINAL PAIN-LUQ 04/17/2009   Qualifier: Diagnosis of  By: Fuller Plan MD Lamont Snowball T    Anal fissure    Anginal pain ([place])    Asthma    Depression    Diabetes mellitus without complication ([place])    Dyspnea    Dysrhythmia    Headache(784.0)    HEMATOCHEZIA 04/17/2009   Qualifier: Diagnosis of  By: Fuller Plan MD Lamont Snowball T    Hyperlipidemia    Hypertension    IBS (irritable bowel syndrome)    Migraine headache 11/06/2008   Qualifier: Diagnosis of  By: Deborra Medina MD, Talia     OVARIAN CYST, RIGHT 02/21/2009   Qualifier: Diagnosis of  By: Deborra Medina MD, Talia     PALPITATIONS, OCCASIONAL 04/17/2010   Qualifier: Diagnosis of  By: Deborra Medina MD, Talia     Pancreatitis 10/14/2017   Past Surgical History:  Procedure Laterality Date   BREAST EXCISIONAL BIOPSY Left 1997   ? left side, done at time of reduction , benign   BREAST REDUCTION SURGERY     bilateral   CESAREAN SECTION     CHOLECYSTECTOMY     COLONOSCOPY     ESOPHAGOGASTRODUODENOSCOPY     ESOPHAGOGASTRODUODENOSCOPY (EGD) WITH PROPOFOL N/A 10/21/2020    Procedure: ESOPHAGOGASTRODUODENOSCOPY (EGD) WITH PROPOFOL;  Surgeon: Lesly Rubenstein, MD;  Location: [facility] ENDOSCOPY;  Service: Endoscopy;  Laterality: N/A;   REDUCTION MAMMAPLASTY Bilateral 1997   VAGINAL HYSTERECTOMY     Patient Active Problem List   Diagnosis Date Noted   Abnormal MRI, lumbar spine (01/29/2020) 02/19/2020   Anxiety due to MRI 01/25/2020   Claustrophobia associated to MRI 01/25/2020   Enthesopathy of knee region (Right) 01/25/2020   Enthesopathy of knee region (Left) 01/25/2020   Lumbar facet hypertrophy 01/25/2020   Lumbosacral radiculopathy at S1 (Left) 01/17/2020   Lumbosacral radiculopathy (S1) (Left) 01/17/2020   Chronic lower extremity pain (Bilateral) (L>R) 01/17/2020   History of pancreatitis 07/18/2019   Obstructive sleep apnea syndrome 01/02/2019   Lumbar facet arthropathy (Multilevel) (Bilateral) 12/14/2018   Osteoarthritis involving multiple joints 11/28/2018   Chronic hip pain (Left) 11/16/2018   Acute hip pain (Left) 11/16/2018   DDD (degenerative disc disease), lumbosacral 11/16/2018   Chronic hip pain (Secondary source of pain) (Bilateral) (L>R) 05/19/2017   Elevated C-reactive protein (CRP) 05/19/2017   Elevated sed rate 05/19/2017   Spondylosis without myelopathy or radiculopathy, lumbosacral region 05/19/2017  Pharmacologic therapy 05/06/2017   Disorder of skeletal system 05/06/2017   Problems influencing health status 05/06/2017   Chest pain with moderate risk of acute coronary syndrome 04/21/2017   DOE (dyspnea on exertion) 04/21/2017   Intermittent palpitations 04/21/2017   Vitamin D insufficiency 05/04/2016   Depression 02/04/2016   Fatty liver 02/04/2016   Hypertension associated with diabetes ([place]) 02/04/2016   Chronic pain syndrome 01/15/2016   Acute low back pain 05/28/2015   Lumbar transverse process fracture (HCC) (Left L1) (03/11/15) 05/28/2015   Long term current use of opiate analgesic 05/01/2015   Long term prescription  opiate use 05/01/2015   Opiate use (25 MME/Day) 05/01/2015   Encounter for therapeutic drug level monitoring 05/01/2015   Encounter for pain management planning 05/01/2015   Chronic low back pain (Primary Source of Pain) (Bilateral) (L>R) 05/01/2015   Lumbar facet syndrome (Bilateral) (L>R) 05/01/2015   Chronic knee pain [facility] source of pain) (Bilateral) (L>R) 05/01/2015   Lumbar spondylosis 05/01/2015   Neuropathic pain 05/01/2015   Neurogenic pain 05/01/2015   Myofascial pain 05/01/2015   Essential hypertriglyceridemia 04/26/2014   Type 2 diabetes mellitus with hyperglycemia, with long-term current use of insulin ([place]) 04/26/2014   Anxiety 05/15/2010   Obesity 05/15/2010   Hyperlipidemia 04/03/2010   Fatty liver disease 04/02/2010   Pure hypercholesterolemia 03/19/2010   GERD 04/17/2009   Constipation 04/17/2009   TOBACCO ABUSE 11/06/2008    ONSET DATE: January 2023  REFERRING DIAG: C71.9 (ICD-10-CM) - Malignant neoplasm of brain, unspecified  THERAPY DIAG:  Muscle weakness (generalized)  Unsteadiness on feet  Other lack of coordination  Difficulty in walking, not elsewhere classified  Rationale for Evaluation and Treatment Rehabilitation  SUBJECTIVE:                                                                                                                                                                                              SUBJECTIVE STATEMENT:  Pt has MRI Thursday. She currently has moderate pain.  Pt accompanied by:  [name]  PAIN:  Are you having pain? 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 Pt's husband, [name], present with pt. [name] provides majority of hx as pt has difficulty with speech due to Broca's aphasia (working with SLP). Pt reports partial paralysis of  R side. She is unable to move her RUE, RLE also affected. Pt ambulates with hemiwalker in their home for short distances and does not use WC at  home. She fatigues quickly with ambulation. Pt has AFO for RLE but unable to determine how to donn it without it hurting her LE. Pt currently with wound on R calf covered by large bandage that they have been treating for at least a month (reports physician aware). In addition to wound on R calf, pt reports R side skin irritation under breast and lateral trunk due to bra underwire. They have since removed underwire. Pt was hospitalized 10 days in June due to UTI. She has had at least 12 falls in the last six months. Reports no injuries with falls, and states "just bumps and bruises." Pt reports she has constant head pain. Pt spouse reports pt has pain throughout her whole right side down to LE. Can have bad cramps in RUE and RLE. Reports no numbness/tingling. Occ dizziness, unable to describe further. Pt sleeps in recliner, wakes up stiff in the morning. PMH per chart includes: Anorexia, dehydration, Adult Failure to thrive, contracture of muscles multiple sites. Pt./caregiver reports recently being hospitalized with a UTI, and dehydration. Extensive hx, please refer to chart for full details.   PAIN: from eval Are you having pain? Yes: NPRS scale: 3/10 Pain location: Top of head, front Pain description: States, "cancer pain" head pain Aggravating factors: unsure Relieving factors: medications  PRECAUTIONS: Fall  WEIGHT BEARING RESTRICTIONS No  FALLS: Has patient fallen in last 6 months? Yes. Number of falls 12  LIVING ENVIRONMENT: information primarily from chart Lives with: lives with their spouse Lives in: House/apartment Stairs: Yes: External: 4 steps; bilateral but cannot reach both Has following equipment at home: Single point cane, Walker - 4 wheeled, Hemi walker, shower chair, bed side commode, Grab bars, and lift chair  PLOF:  Independent  PATIENT GOALS  "Normal function" as much as possible. Improving her balance.   TODAY'S TREATMENT:   TherEx:  Nustep lvl 1 - 2 performed in at least 60 sec intervals for total of 10 min. 1 rest break. PT providing min assist throughout for RLE positioning.   STS from increased seat height 3x with mod assist   Gait with HW 2x4 ft, close CGA-min assist    PATIENT EDUCATION: Education details: Pt educated throughout session about proper posture and technique with exercises. Improved exercise technique, movement at target joints, use of target muscles after min to mod verbal, visual, tactile cues.   Person educated: pt and her spouse Education method:Explanation, Demonstration, Tactile cues, and Verbal cues, Education comprehension: verbalized understanding, returned demonstration, tactile cues required, and needs further education   HOME EXERCISE PROGRAM:  Pt to perform only as many reps of STS per set as able  Access Code: [email or web address] URL: [email or web address] Date: 11/18/2021 Prepared by: Ricard Dillon  Exercises - Elbow Extension with Anchored Resistance  - 1 x daily - 5 x weekly - 3 sets - 10 reps  Access Code: [email or web address] URL: [email or web address] Date: 10/02/2021 Prepared by: Sande Brothers  Exercises - Seated Hip Flexion March with Ankle Weights  - 1 x daily - 7 x weekly - 3 sets - 10 reps - Seated Long Arc Quad  - 1 x daily - 7 x weekly - 3 sets - 10 reps - Supine Quad Set  - 1 x daily - 7 x weekly - 3 sets - 10 reps - Proper Sit to Stand Technique  - 1 x daily - 7 x weekly - 3 sets - 10 reps  GOALS: Goals reviewed with patient? Yes  SHORT TERM GOALS: Target date: 01/29/2022  Patient will be independent in home exercise program to improve strength/mobility for better functional independence with ADLs. Baseline: to be initiated next 1-2 sessions; 10/17 previously issued, pt is 11/16: to be advanced as pt has gained  strength since previous assessment  Goal status: ONGOING   LONG TERM GOALS: Target date: 03/12/2022   Patient will demo ability to perform at least 1 STS by pushing from [facility] with LUE instead of pulling to stand to improve functional mobility Baseline: Goal changed as pt FOTO tracked by OT, with new goal: pt currently pulls to stand; 11/16: deferred due to pt feeling unwell; 11/22: able to complete with mod assist; 03/03/2022: unable to complete STS with push to stand technique Goal status: ONGOING   2.   Patient (< 72 years old) will be able to perform and complete five times sit to stand test from standard chair height in <30 seconds to indicate increased LE strength and improved balance. Baseline: Pt able to complete only 1 stand, insufficient strength currently to complete full test; 10/17 31 seconds pull to stand with UE, close CGA-min a 11/16: deferred due to pt feeling unwell; 03/03/2022: 30 sec pull to stand technique used Goal status:  PARTIALLY MET  3.  Pt will improve LLE strength by at least 1/2 point on MMT in order to increase ease with transfers and functional mobility. Baseline: LLE grossly 4/5 throughout; 10/17: still generally 4/5 B, however now exhibits AROM with R hip flexion and knee ext; 11/16: 4+/5 LLE, RLE 3-/5, noted improvement however in quads and hip flexors  Goal status: MET  4.  Patient will increase 10 meter walk test to at least 0.80 m/s as to improve gait speed for better community ambulation and to reduce fall risk. Baseline: to be tested next 1-2 visits; 10/17: pt unable to perform 11/16: deferred due to pt feeling unwell; 11/21: 0.17 m/s; 03/03/22: 0.13 m/s with HW Goal status: IN PROGRESS  5.  Pt will complete STS from standard height chair with UUE assist off armrests and with no greater than min assist to improve ease and safety with transfers. Baseline: mod a x2; 10/17: pulling to stand with LUE CGA 11/16: deferred due to pt feeling unwell; 11/21: currently  requires mod a to complete; 03/03/2022: pt attempted indep today but unable to complete Goal status: IN PROGRESS,   6.  Pt will improve RLE strength by at least 1/2 point on MMT in order to increase ease with transfers and functional mobility. Baseline: 11/16: 4+/5 LLE, RLE 3-/5, noted improvement however in quads and hip flexors on R; 03/03/2022: 3-/5 RLE, noted increased tone throughout RLE musculature  Goal status: NEW   ASSESSMENT:  CLINICAL IMPRESSION: Pt able to progress nustep intervention to ten minutes and performing 60 sec intervals at levels 1 and 2. Remainder of session limited due to fatigue following nustep intervention. Pt otherwise tolerated exercises well without increased pain. The pt would benefit from continued skilled PT to address immobility, LE muscle weakness, and pain in order to decrease fall risk and increase functional mobility and QOL.   OBJECTIVE IMPAIRMENTS Abnormal gait, decreased activity tolerance, decreased balance, decreased coordination, decreased endurance, decreased mobility, difficulty walking, decreased ROM, decreased strength, hypomobility, increased edema, increased fascial restrictions, increased muscle spasms, impaired flexibility, impaired sensation, impaired tone, impaired UE functional use, improper body mechanics, postural dysfunction, and pain.   ACTIVITY LIMITATIONS carrying, lifting, bending, sitting, standing, squatting, stairs, transfers, bed mobility, bathing, toileting, dressing, reach over head, hygiene/grooming, locomotion level, and  caring for others  PARTICIPATION LIMITATIONS: meal prep, cleaning, laundry, medication management, personal finances, driving, shopping, community activity, occupation, and yard work  PERSONAL Education officer, community, Past/current experiences, Sex, and 3+ comorbidities: Anorexia, dehydration, Adult Failure to thrive, contracture of muscles multiple sites, multiple falls, UTI, extensive PMH refer to chart  are also  affecting patient's functional outcome.   REHAB POTENTIAL: Fair    CLINICAL DECISION MAKING: Evolving/moderate complexity  EVALUATION COMPLEXITY: High  PLAN: PT FREQUENCY: 2x/week  PT DURATION: 12 weeks  PLANNED INTERVENTIONS: Therapeutic exercises, Therapeutic activity, Neuromuscular re-education, Balance training, Gait training, Patient/Family education, Self Care, Joint mobilization, Joint manipulation, Stair training, Vestibular training, Canalith repositioning, Orthotic/Fit training, DME instructions, Wheelchair mobility training, Spinal mobilization, Cryotherapy, Moist heat, scar mobilization, Splintting, Taping, Manual therapy, and Re-evaluation  PLAN FOR NEXT SESSION:  stretching, strengthening, gait, balance, manual, standing balance, RLE stretching 3:34 PM, 04/28/22   Zollie Pee, PT 04/28/2022, 3:34 PM

## 2022-05-06 ENCOUNTER — Ambulatory Visit: Payer: Medicare Other

## 2022-05-11 ENCOUNTER — Ambulatory Visit: Payer: Medicare Other

## 2022-05-19 ENCOUNTER — Ambulatory Visit: Payer: Medicare Other

## 2022-05-26 ENCOUNTER — Ambulatory Visit: Payer: Medicare Other | Attending: Adult Health Nurse Practitioner

## 2022-05-26 DIAGNOSIS — R2681 Unsteadiness on feet: Secondary | ICD-10-CM | POA: Insufficient documentation

## 2022-05-26 DIAGNOSIS — R278 Other lack of coordination: Secondary | ICD-10-CM | POA: Diagnosis present

## 2022-05-26 DIAGNOSIS — M25661 Stiffness of right knee, not elsewhere classified: Secondary | ICD-10-CM | POA: Diagnosis present

## 2022-05-26 DIAGNOSIS — R262 Difficulty in walking, not elsewhere classified: Secondary | ICD-10-CM | POA: Diagnosis present

## 2022-05-26 DIAGNOSIS — M6281 Muscle weakness (generalized): Secondary | ICD-10-CM | POA: Diagnosis present

## 2022-05-26 NOTE — Therapy (Signed)
OUTPATIENT PHYSICAL THERAPY NEURO TREATMENT     Patient Name: Kelly Rollins MRN: [id number] DOB:February 03, 1971, 52 y.o., female Today's Date: 05/26/2022   PCP: Wilford Corner, PA-C REFERRING PROVIDER: Etta Quill, Anette Riedel, PA-C   PT End of Session - 05/26/22 1737     Visit Number 24    Number of Visits 45    Date for PT Re-Evaluation 06/04/22   corrected/addended for recert   Authorization Type [employer]    Authorization Time Period 09/16/21-12/09/21    PT Start Time 1431    PT Stop Time 1511    PT Time Calculation (min) 40 min    Equipment Utilized During Treatment Gait belt    Activity Tolerance Patient tolerated treatment well    Behavior During Therapy [facility] for tasks assessed/performed                         Past Medical History:  Diagnosis Date   ABDOMINAL PAIN OTHER SPECIFIED SITE 02/21/2009   Qualifier: Diagnosis of  By: Dayton Martes MD, Talia     ABDOMINAL PAIN-LUQ 04/17/2009   Qualifier: Diagnosis of  By: Russella Dar MD Bronson Curb T    Anal fissure    Anginal pain    Asthma    Depression    Diabetes mellitus without complication    Dyspnea    Dysrhythmia    Headache(784.0)    HEMATOCHEZIA 04/17/2009   Qualifier: Diagnosis of  By: Russella Dar MD Bronson Curb T    Hyperlipidemia    Hypertension    IBS (irritable bowel syndrome)    Migraine headache 11/06/2008   Qualifier: Diagnosis of  By: Dayton Martes MD, Talia     OVARIAN CYST, RIGHT 02/21/2009   Qualifier: Diagnosis of  By: Dayton Martes MD, Talia     PALPITATIONS, OCCASIONAL 04/17/2010   Qualifier: Diagnosis of  By: Dayton Martes MD, Talia     Pancreatitis 10/14/2017   Past Surgical History:  Procedure Laterality Date   BREAST EXCISIONAL BIOPSY Left 1997   ? left side, done at time of reduction , benign   BREAST REDUCTION SURGERY     bilateral   CESAREAN SECTION     CHOLECYSTECTOMY     COLONOSCOPY     ESOPHAGOGASTRODUODENOSCOPY     ESOPHAGOGASTRODUODENOSCOPY (EGD) WITH PROPOFOL N/A 10/21/2020   Procedure:  ESOPHAGOGASTRODUODENOSCOPY (EGD) WITH PROPOFOL;  Surgeon: Regis Bill, MD;  Location: [facility] ENDOSCOPY;  Service: Endoscopy;  Laterality: N/A;   REDUCTION MAMMAPLASTY Bilateral 1997   VAGINAL HYSTERECTOMY     Patient Active Problem List   Diagnosis Date Noted   Abnormal MRI, lumbar spine (01/29/2020) 02/19/2020   Anxiety due to MRI 01/25/2020   Claustrophobia associated to MRI 01/25/2020   Enthesopathy of knee region (Right) 01/25/2020   Enthesopathy of knee region (Left) 01/25/2020   Lumbar facet hypertrophy 01/25/2020   Lumbosacral radiculopathy at S1 (Left) 01/17/2020   Lumbosacral radiculopathy (S1) (Left) 01/17/2020   Chronic lower extremity pain (Bilateral) (L>R) 01/17/2020   History of pancreatitis 07/18/2019   Obstructive sleep apnea syndrome 01/02/2019   Lumbar facet arthropathy (Multilevel) (Bilateral) 12/14/2018   Osteoarthritis involving multiple joints 11/28/2018   Chronic hip pain (Left) 11/16/2018   Acute hip pain (Left) 11/16/2018   DDD (degenerative disc disease), lumbosacral 11/16/2018   Chronic hip pain (Secondary source of pain) (Bilateral) (L>R) 05/19/2017   Elevated C-reactive protein (CRP) 05/19/2017   Elevated sed rate 05/19/2017   Spondylosis without myelopathy or radiculopathy, lumbosacral region 05/19/2017   Pharmacologic  therapy 05/06/2017   Disorder of skeletal system 05/06/2017   Problems influencing health status 05/06/2017   Chest pain with moderate risk of acute coronary syndrome 04/21/2017   DOE (dyspnea on exertion) 04/21/2017   Intermittent palpitations 04/21/2017   Vitamin D insufficiency 05/04/2016   Depression 02/04/2016   Fatty liver 02/04/2016   Hypertension associated with diabetes 02/04/2016   Chronic pain syndrome 01/15/2016   Acute low back pain 05/28/2015   Lumbar transverse process fracture (HCC) (Left L1) (03/11/15) 05/28/2015   Long term current use of opiate analgesic 05/01/2015   Long term prescription opiate use  05/01/2015   Opiate use (25 MME/Day) 05/01/2015   Encounter for therapeutic drug level monitoring 05/01/2015   Encounter for pain management planning 05/01/2015   Chronic low back pain (Primary Source of Pain) (Bilateral) (L>R) 05/01/2015   Lumbar facet syndrome (Bilateral) (L>R) 05/01/2015   Chronic knee pain [facility] source of pain) (Bilateral) (L>R) 05/01/2015   Lumbar spondylosis 05/01/2015   Neuropathic pain 05/01/2015   Neurogenic pain 05/01/2015   Myofascial pain 05/01/2015   Essential hypertriglyceridemia 04/26/2014   Type 2 diabetes mellitus with hyperglycemia, with long-term current use of insulin 04/26/2014   Anxiety 05/15/2010   Obesity 05/15/2010   Hyperlipidemia 04/03/2010   Fatty liver disease 04/02/2010   Pure hypercholesterolemia 03/19/2010   GERD 04/17/2009   Constipation 04/17/2009   TOBACCO ABUSE 11/06/2008    ONSET DATE: January 2023  REFERRING DIAG: C71.9 (ICD-10-CM) - Malignant neoplasm of brain, unspecified  THERAPY DIAG:  Muscle weakness (generalized)  Difficulty in walking, not elsewhere classified  Other lack of coordination  Unsteadiness on feet  Rationale for Evaluation and Treatment Rehabilitation  SUBJECTIVE:                                                                                                                                                                                              SUBJECTIVE STATEMENT:  Pt reports she missed previous appointments due to feeling unwell from chemo. She has not been up much lately and would like to try walking today.  Pt accompanied by:  [name]  PAIN:  Are you having pain? 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 Pt's husband, Kelly Rollins, present with pt. Kelly Rollins provides majority of hx as pt has difficulty with  speech due to Broca's aphasia (working with SLP). Pt reports partial paralysis of R side. She is unable to move her RUE, RLE also affected. Pt ambulates  with hemiwalker in their home for short distances and does not use WC at home. She fatigues quickly with ambulation. Pt has AFO for RLE but unable to determine how to donn it without it hurting her LE. Pt currently with wound on R calf covered by large bandage that they have been treating for at least a month (reports physician aware). In addition to wound on R calf, pt reports R side skin irritation under breast and lateral trunk due to bra underwire. They have since removed underwire. Pt was hospitalized 10 days in June due to UTI. She has had at least 12 falls in the last six months. Reports no injuries with falls, and states "just bumps and bruises." Pt reports she has constant head pain. Pt spouse reports pt has pain throughout her whole right side down to LE. Can have bad cramps in RUE and RLE. Reports no numbness/tingling. Occ dizziness, unable to describe further. Pt sleeps in recliner, wakes up stiff in the morning. PMH per chart includes: Anorexia, dehydration, Adult Failure to thrive, contracture of muscles multiple sites. Pt./caregiver reports recently being hospitalized with a UTI, and dehydration. Extensive hx, please refer to chart for full details.   PAIN: from eval Are you having pain? Yes: NPRS scale: 3/10 Pain location: Top of head, front Pain description: States, "cancer pain" head pain Aggravating factors: unsure Relieving factors: medications  PRECAUTIONS: Fall  WEIGHT BEARING RESTRICTIONS No  FALLS: Has patient fallen in last 6 months? Yes. Number of falls 12  LIVING ENVIRONMENT: information primarily from chart Lives with: lives with their spouse Lives in: House/apartment Stairs: Yes: External: 4 steps; bilateral but cannot reach both Has following equipment at home: Single point cane, Walker - 4 wheeled, Hemi walker,  shower chair, bed side commode, Grab bars, and lift chair  PLOF: Independent  PATIENT GOALS  "Normal function" as much as possible. Improving her balance.   TODAY'S TREATMENT:   TherEx:  Gait fwd/bckwd // bars 3x length of bars, close CGA and WC follow Gait with HW x 148 ft close CGA, WC follow  1.5# AW donned each LLE: LAQ 1x5, 2x10 each LE, PT assists RLE through concentric component  Seated soccer ball kicks RLE only with and without 1.5# AW donned x multiple repitions     PATIENT EDUCATION: Education details: Pt educated throughout session about proper posture and technique with exercises. Improved exercise technique, movement at target joints, use of target muscles after min to mod verbal, visual, tactile cues.   Person educated: pt and her spouse Education method:Explanation, Demonstration, Tactile cues, and Verbal cues, Education comprehension: verbalized understanding, returned demonstration, tactile cues required, and needs further education   HOME EXERCISE PROGRAM:  Pt to perform only as many reps of STS per set as able  Access Code: [email or web address] URL: [email or web address] Date: 11/18/2021 Prepared by: Temple Pacini  Exercises - Elbow Extension with Anchored Resistance  - 1 x daily - 5 x weekly - 3 sets - 10 reps  Access Code: [email or web address] URL: [email or web address] Date: 10/02/2021 Prepared by: Maureen Ralphs  Exercises - Seated Hip Flexion March with Ankle Weights  - 1 x daily - 7 x weekly - 3 sets - 10 reps - Seated Long Arc Quad  - 1 x daily - 7 x weekly - 3 sets - 10 reps - Supine Quad Set  - 1 x daily - 7 x weekly - 3 sets - 10 reps - Proper Sit to Stand Technique  - 1 x daily - 7 x  weekly - 3 sets - 10 reps  GOALS: Goals reviewed with patient? Yes  SHORT TERM GOALS: Target date: 01/29/2022   Patient will be independent in home exercise program to improve strength/mobility for better functional independence with  ADLs. Baseline: to be initiated next 1-2 sessions; 10/17 previously issued, pt is 11/16: to be advanced as pt has gained strength since previous assessment  Goal status: ONGOING   LONG TERM GOALS: Target date: 03/12/2022   Patient will demo ability to perform at least 1 STS by pushing from [facility] with LUE instead of pulling to stand to improve functional mobility Baseline: Goal changed as pt FOTO tracked by OT, with new goal: pt currently pulls to stand; 11/16: deferred due to pt feeling unwell; 11/22: able to complete with mod assist; 03/03/2022: unable to complete STS with push to stand technique Goal status: ONGOING   2.   Patient (< 64 years old) will be able to perform and complete five times sit to stand test from standard chair height in <30 seconds to indicate increased LE strength and improved balance. Baseline: Pt able to complete only 1 stand, insufficient strength currently to complete full test; 10/17 31 seconds pull to stand with UE, close CGA-min a 11/16: deferred due to pt feeling unwell; 03/03/2022: 30 sec pull to stand technique used Goal status:  PARTIALLY MET  3.  Pt will improve LLE strength by at least 1/2 point on MMT in order to increase ease with transfers and functional mobility. Baseline: LLE grossly 4/5 throughout; 10/17: still generally 4/5 B, however now exhibits AROM with R hip flexion and knee ext; 11/16: 4+/5 LLE, RLE 3-/5, noted improvement however in quads and hip flexors  Goal status: MET  4.  Patient will increase 10 meter walk test to at least 0.80 m/s as to improve gait speed for better community ambulation and to reduce fall risk. Baseline: to be tested next 1-2 visits; 10/17: pt unable to perform 11/16: deferred due to pt feeling unwell; 11/21: 0.17 m/s; 03/03/22: 0.13 m/s with HW Goal status: IN PROGRESS  5.  Pt will complete STS from standard height chair with UUE assist off armrests and with no greater than min assist to improve ease and safety with  transfers. Baseline: mod a x2; 10/17: pulling to stand with LUE CGA 11/16: deferred due to pt feeling unwell; 11/21: currently requires mod a to complete; 03/03/2022: pt attempted indep today but unable to complete Goal status: IN PROGRESS,   6.  Pt will improve RLE strength by at least 1/2 point on MMT in order to increase ease with transfers and functional mobility. Baseline: 11/16: 4+/5 LLE, RLE 3-/5, noted improvement however in quads and hip flexors on R; 03/03/2022: 3-/5 RLE, noted increased tone throughout RLE musculature  Goal status: NEW   ASSESSMENT:  CLINICAL IMPRESSION: The pt returns after absence from PT due to feeling unwell. Pt tolerates interventions well and is able to ambulate 148 ft with HW, close CGA and WC follow. Pt then able to complete a few more seated therex interventions. Despite absence from PT, this indicates functional carryover and improvement in activity tolerance. The pt would benefit from continued skilled PT to address immobility, LE muscle weakness, and pain in order to decrease fall risk and increase functional mobility and QOL.   OBJECTIVE IMPAIRMENTS Abnormal gait, decreased activity tolerance, decreased balance, decreased coordination, decreased endurance, decreased mobility, difficulty walking, decreased ROM, decreased strength, hypomobility, increased edema, increased fascial restrictions, increased muscle spasms, impaired flexibility,  impaired sensation, impaired tone, impaired UE functional use, improper body mechanics, postural dysfunction, and pain.   ACTIVITY LIMITATIONS carrying, lifting, bending, sitting, standing, squatting, stairs, transfers, bed mobility, bathing, toileting, dressing, reach over head, hygiene/grooming, locomotion level, and caring for others  PARTICIPATION LIMITATIONS: meal prep, cleaning, laundry, medication management, personal finances, driving, shopping, community activity, occupation, and yard work  PERSONAL Chief Strategy Officer, Past/current experiences, Sex, and 3+ comorbidities: Anorexia, dehydration, Adult Failure to thrive, contracture of muscles multiple sites, multiple falls, UTI, extensive PMH refer to chart  are also affecting patient's functional outcome.   REHAB POTENTIAL: Fair    CLINICAL DECISION MAKING: Evolving/moderate complexity  EVALUATION COMPLEXITY: High  PLAN: PT FREQUENCY: 2x/week  PT DURATION: 12 weeks  PLANNED INTERVENTIONS: Therapeutic exercises, Therapeutic activity, Neuromuscular re-education, Balance training, Gait training, Patient/Family education, Self Care, Joint mobilization, Joint manipulation, Stair training, Vestibular training, Canalith repositioning, Orthotic/Fit training, DME instructions, Wheelchair mobility training, Spinal mobilization, Cryotherapy, Moist heat, scar mobilization, Splintting, Taping, Manual therapy, and Re-evaluation  PLAN FOR NEXT SESSION:  stretching, strengthening, gait, balance, manual, standing balance, RLE stretching 5:41 PM, 05/26/22   Baird Kay, PT 05/26/2022, 5:41 PM

## 2022-06-02 ENCOUNTER — Ambulatory Visit: Payer: Medicare Other

## 2022-06-02 DIAGNOSIS — R262 Difficulty in walking, not elsewhere classified: Secondary | ICD-10-CM

## 2022-06-02 DIAGNOSIS — M6281 Muscle weakness (generalized): Secondary | ICD-10-CM | POA: Diagnosis not present

## 2022-06-02 DIAGNOSIS — M25661 Stiffness of right knee, not elsewhere classified: Secondary | ICD-10-CM

## 2022-06-02 DIAGNOSIS — R278 Other lack of coordination: Secondary | ICD-10-CM

## 2022-06-02 DIAGNOSIS — R2681 Unsteadiness on feet: Secondary | ICD-10-CM

## 2022-06-02 NOTE — Therapy (Signed)
OUTPATIENT PHYSICAL THERAPY NEURO TREATMENT     Patient Name: Kelly Rollins MRN: [id number] DOB:1970/08/09, 52 y.o., female Today's Date: 06/02/2022   PCP: Wilford Corner, PA-C REFERRING PROVIDER: Etta Quill, Anette Riedel, PA-C   PT End of Session - 06/02/22 1507     Visit Number 25    Number of Visits 45    Date for PT Re-Evaluation 06/04/22   corrected/addended for recert   Authorization Type [employer]    Authorization Time Period 09/16/21-12/09/21    PT Start Time 1431    PT Stop Time 1505    PT Time Calculation (min) 34 min    Equipment Utilized During Treatment Gait belt    Activity Tolerance Patient tolerated treatment well;Patient limited by fatigue    Behavior During Therapy [facility] for tasks assessed/performed                          Past Medical History:  Diagnosis Date   ABDOMINAL PAIN OTHER SPECIFIED SITE 02/21/2009   Qualifier: Diagnosis of  By: Dayton Martes MD, Talia     ABDOMINAL PAIN-LUQ 04/17/2009   Qualifier: Diagnosis of  By: Russella Dar MD Bronson Curb T    Anal fissure    Anginal pain (HCC)    Asthma    Depression    Diabetes mellitus without complication (HCC)    Dyspnea    Dysrhythmia    Headache(784.0)    HEMATOCHEZIA 04/17/2009   Qualifier: Diagnosis of  By: Russella Dar MD Bronson Curb T    Hyperlipidemia    Hypertension    IBS (irritable bowel syndrome)    Migraine headache 11/06/2008   Qualifier: Diagnosis of  By: Dayton Martes MD, Talia     OVARIAN CYST, RIGHT 02/21/2009   Qualifier: Diagnosis of  By: Dayton Martes MD, Talia     PALPITATIONS, OCCASIONAL 04/17/2010   Qualifier: Diagnosis of  By: Dayton Martes MD, Talia     Pancreatitis 10/14/2017   Past Surgical History:  Procedure Laterality Date   BREAST EXCISIONAL BIOPSY Left 1997   ? left side, done at time of reduction , benign   BREAST REDUCTION SURGERY     bilateral   CESAREAN SECTION     CHOLECYSTECTOMY     COLONOSCOPY     ESOPHAGOGASTRODUODENOSCOPY     ESOPHAGOGASTRODUODENOSCOPY (EGD)  WITH PROPOFOL N/A 10/21/2020   Procedure: ESOPHAGOGASTRODUODENOSCOPY (EGD) WITH PROPOFOL;  Surgeon: Regis Bill, MD;  Location: [facility] ENDOSCOPY;  Service: Endoscopy;  Laterality: N/A;   REDUCTION MAMMAPLASTY Bilateral 1997   VAGINAL HYSTERECTOMY     Patient Active Problem List   Diagnosis Date Noted   Abnormal MRI, lumbar spine (01/29/2020) 02/19/2020   Anxiety due to MRI 01/25/2020   Claustrophobia associated to MRI 01/25/2020   Enthesopathy of knee region (Right) 01/25/2020   Enthesopathy of knee region (Left) 01/25/2020   Lumbar facet hypertrophy 01/25/2020   Lumbosacral radiculopathy at S1 (Left) 01/17/2020   Lumbosacral radiculopathy (S1) (Left) 01/17/2020   Chronic lower extremity pain (Bilateral) (L>R) 01/17/2020   History of pancreatitis 07/18/2019   Obstructive sleep apnea syndrome 01/02/2019   Lumbar facet arthropathy (Multilevel) (Bilateral) 12/14/2018   Osteoarthritis involving multiple joints 11/28/2018   Chronic hip pain (Left) 11/16/2018   Acute hip pain (Left) 11/16/2018   DDD (degenerative disc disease), lumbosacral 11/16/2018   Chronic hip pain (Secondary source of pain) (Bilateral) (L>R) 05/19/2017   Elevated C-reactive protein (CRP) 05/19/2017   Elevated sed rate 05/19/2017   Spondylosis without myelopathy or radiculopathy,  lumbosacral region 05/19/2017   Pharmacologic therapy 05/06/2017   Disorder of skeletal system 05/06/2017   Problems influencing health status 05/06/2017   Chest pain with moderate risk of acute coronary syndrome 04/21/2017   DOE (dyspnea on exertion) 04/21/2017   Intermittent palpitations 04/21/2017   Vitamin D insufficiency 05/04/2016   Depression 02/04/2016   Fatty liver 02/04/2016   Hypertension associated with diabetes (HCC) 02/04/2016   Chronic pain syndrome 01/15/2016   Acute low back pain 05/28/2015   Lumbar transverse process fracture (HCC) (Left L1) (03/11/15) 05/28/2015   Long term current use of opiate analgesic  05/01/2015   Long term prescription opiate use 05/01/2015   Opiate use (25 MME/Day) 05/01/2015   Encounter for therapeutic drug level monitoring 05/01/2015   Encounter for pain management planning 05/01/2015   Chronic low back pain (Primary Source of Pain) (Bilateral) (L>R) 05/01/2015   Lumbar facet syndrome (Bilateral) (L>R) 05/01/2015   Chronic knee pain [facility] source of pain) (Bilateral) (L>R) 05/01/2015   Lumbar spondylosis 05/01/2015   Neuropathic pain 05/01/2015   Neurogenic pain 05/01/2015   Myofascial pain 05/01/2015   Essential hypertriglyceridemia 04/26/2014   Type 2 diabetes mellitus with hyperglycemia, with long-term current use of insulin (HCC) 04/26/2014   Anxiety 05/15/2010   Obesity 05/15/2010   Hyperlipidemia 04/03/2010   Fatty liver disease 04/02/2010   Pure hypercholesterolemia 03/19/2010   GERD 04/17/2009   Constipation 04/17/2009   TOBACCO ABUSE 11/06/2008    ONSET DATE: January 2023  REFERRING DIAG: C71.9 (ICD-10-CM) - Malignant neoplasm of brain, unspecified  THERAPY DIAG:  Muscle weakness (generalized)  Difficulty in walking, not elsewhere classified  Other lack of coordination  Unsteadiness on feet  Stiffness of right knee, not elsewhere classified  Rationale for Evaluation and Treatment Rehabilitation  SUBJECTIVE:                                                                                                                                                                                              SUBJECTIVE STATEMENT: Pt has had botox in LE and UE, she is not sure it has had much of an effect. She has a follow-up appt in July. Pt is feeling good today.  Pt accompanied by:  Kelly Rollins  PAIN:  Are you having pain? 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 Pt's  husband, Kelly Rollins, present with pt. Kelly Rollins provides majority of hx as pt has difficulty with speech due to Broca's aphasia (working with  SLP). Pt reports partial paralysis of R side. She is unable to move her RUE, RLE also affected. Pt ambulates with hemiwalker in their home for short distances and does not use WC at home. She fatigues quickly with ambulation. Pt has AFO for RLE but unable to determine how to donn it without it hurting her LE. Pt currently with wound on R calf covered by large bandage that they have been treating for at least a month (reports physician aware). In addition to wound on R calf, pt reports R side skin irritation under breast and lateral trunk due to bra underwire. They have since removed underwire. Pt was hospitalized 10 days in June due to UTI. She has had at least 12 falls in the last six months. Reports no injuries with falls, and states "just bumps and bruises." Pt reports she has constant head pain. Pt spouse reports pt has pain throughout her whole right side down to LE. Can have bad cramps in RUE and RLE. Reports no numbness/tingling. Occ dizziness, unable to describe further. Pt sleeps in recliner, wakes up stiff in the morning. PMH per chart includes: Anorexia, dehydration, Adult Failure to thrive, contracture of muscles multiple sites. Pt./caregiver reports recently being hospitalized with a UTI, and dehydration. Extensive hx, please refer to chart for full details.   PAIN: from eval Are you having pain? Yes: NPRS scale: 3/10 Pain location: Top of head, front Pain description: States, "cancer pain" head pain Aggravating factors: unsure Relieving factors: medications  PRECAUTIONS: Fall  WEIGHT BEARING RESTRICTIONS No  FALLS: Has patient fallen in last 6 months? Yes. Number of falls 12  LIVING ENVIRONMENT: information primarily from chart Lives with: lives with their spouse Lives in: House/apartment Stairs: Yes: External: 4 steps; bilateral but cannot reach  both Has following equipment at home: Single point cane, Walker - 4 wheeled, Hemi walker, shower chair, bed side commode, Grab bars, and lift chair  PLOF: Independent  PATIENT GOALS  "Normal function" as much as possible. Improving her balance.   TODAY'S TREATMENT:   TherEx:  Gait with HW x 140 ft close CGA, WC follow, mod assist for STS to [facility] PT assists pt from WC<>nustep 2x with mod assist for STS and close CGA for gait (x 4 ft) to nustep/WC Heat donned to R shoulder while pt performs the following intervention (no adverse reaction to heat, skin WNL prior to application and upon removal of heat) Nustep - completed for LE mm strength/endurance and cardiorespiratory endurance, level 1 x 8 minutes. SPM maintained 40s-50s. PT provides min assist throughout for LE positioning and up to mod for mount/dismount.  Session ended early following nustep endurance training secondary to increased fatigue and to prevent excessive fatigue.      PATIENT EDUCATION: Education details: Pt educated throughout session about proper posture and technique with exercises. Improved exercise technique, movement at target joints, use of target muscles after min to mod verbal, visual, tactile cues.   Person educated: pt and her spouse Education method:Explanation, Demonstration, Tactile cues, and Verbal cues, Education comprehension: verbalized understanding, returned demonstration, tactile cues required, and needs further education   HOME EXERCISE PROGRAM:  Pt to perform only as many reps of STS per set as able  Access Code: [email or web address] URL: [email or web address] Date: 11/18/2021 Prepared by: Temple Pacini  Exercises - Elbow Extension with Anchored Resistance  - 1 x daily - 5 x weekly - 3 sets - 10 reps  Access Code: [email or web address] URL: [email or web address] Date: 10/02/2021 Prepared by: Maureen Ralphs  Exercises - Seated Hip Flexion March with Ankle Weights  - 1 x daily - 7 x  weekly - 3 sets - 10 reps - Seated Long Arc Quad  - 1 x daily - 7 x weekly - 3 sets - 10 reps - Supine Quad Set  - 1 x daily - 7 x weekly - 3 sets - 10 reps - Proper Sit to Stand Technique  - 1 x daily - 7 x weekly - 3 sets - 10 reps  GOALS: Goals reviewed with patient? Yes  SHORT TERM GOALS: Target date: 01/29/2022   Patient will be independent in home exercise program to improve strength/mobility for better functional independence with ADLs. Baseline: to be initiated next 1-2 sessions; 10/17 previously issued, pt is 11/16: to be advanced as pt has gained strength since previous assessment  Goal status: ONGOING   LONG TERM GOALS: Target date: 03/12/2022   Patient will demo ability to perform at least 1 STS by pushing from [facility] with LUE instead of pulling to stand to improve functional mobility Baseline: Goal changed as pt FOTO tracked by OT, with new goal: pt currently pulls to stand; 11/16: deferred due to pt feeling unwell; 11/22: able to complete with mod assist; 03/03/2022: unable to complete STS with push to stand technique Goal status: ONGOING   2.   Patient (< 49 years old) will be able to perform and complete five times sit to stand test from standard chair height in <30 seconds to indicate increased LE strength and improved balance. Baseline: Pt able to complete only 1 stand, insufficient strength currently to complete full test; 10/17 31 seconds pull to stand with UE, close CGA-min a 11/16: deferred due to pt feeling unwell; 03/03/2022: 30 sec pull to stand technique used Goal status:  PARTIALLY MET  3.  Pt will improve LLE strength by at least 1/2 point on MMT in order to increase ease with transfers and functional mobility. Baseline: LLE grossly 4/5 throughout; 10/17: still generally 4/5 B, however now exhibits AROM with R hip flexion and knee ext; 11/16: 4+/5 LLE, RLE 3-/5, noted improvement however in quads and hip flexors  Goal status: MET  4.  Patient will increase 10 meter  walk test to at least 0.80 m/s as to improve gait speed for better community ambulation and to reduce fall risk. Baseline: to be tested next 1-2 visits; 10/17: pt unable to perform 11/16: deferred due to pt feeling unwell; 11/21: 0.17 m/s; 03/03/22: 0.13 m/s with HW Goal status: IN PROGRESS  5.  Pt will complete STS from standard height chair with UUE assist off armrests and with no greater than min assist to improve ease and safety with transfers. Baseline: mod a x2; 10/17: pulling to stand with LUE CGA 11/16: deferred due to pt feeling unwell; 11/21: currently requires mod a to complete; 03/03/2022: pt attempted indep today but unable to complete Goal status: IN PROGRESS,   6.  Pt will improve RLE strength by at least 1/2 point on MMT in order to increase ease with transfers and functional mobility. Baseline: 11/16: 4+/5 LLE, RLE 3-/5, noted improvement however in quads and hip flexors on R; 03/03/2022: 3-/5 RLE, noted increased tone throughout RLE musculature  Goal status: NEW   ASSESSMENT:  CLINICAL IMPRESSION: Pt continues to exhibit improved activity tolerance by completing 140 ft of ambulation with HW followed by 8 minutes on the nustep. While pt shows progress, session did have to end early following nustep to prevent excessive fatigue.  The pt would benefit from continued skilled PT to address immobility, LE muscle weakness, and pain in order to decrease fall risk and increase functional mobility and QOL.   OBJECTIVE IMPAIRMENTS Abnormal gait, decreased activity tolerance, decreased balance, decreased coordination, decreased endurance, decreased mobility, difficulty walking, decreased ROM, decreased strength, hypomobility, increased edema, increased fascial restrictions, increased muscle spasms, impaired flexibility, impaired sensation, impaired tone, impaired UE functional use, improper body mechanics, postural dysfunction, and pain.   ACTIVITY LIMITATIONS carrying, lifting, bending,  sitting, standing, squatting, stairs, transfers, bed mobility, bathing, toileting, dressing, reach over head, hygiene/grooming, locomotion level, and caring for others  PARTICIPATION LIMITATIONS: meal prep, cleaning, laundry, medication management, personal finances, driving, shopping, community activity, occupation, and yard work  PERSONAL [place], Past/current experiences, Sex, and 3+ comorbidities: Anorexia, dehydration, Adult Failure to thrive, contracture of muscles multiple sites, multiple falls, UTI, extensive PMH refer to chart  are also affecting patient's functional outcome.   REHAB POTENTIAL: Fair    CLINICAL DECISION MAKING: Evolving/moderate complexity  EVALUATION COMPLEXITY: High  PLAN: PT FREQUENCY: 2x/week  PT DURATION: 12 weeks  PLANNED INTERVENTIONS: Therapeutic exercises, Therapeutic activity, Neuromuscular re-education, Balance training, Gait training, Patient/Family education, Self Care, Joint mobilization, Joint manipulation, Stair training, Vestibular training, Canalith repositioning, Orthotic/Fit training, DME instructions, Wheelchair mobility training, Spinal mobilization, Cryotherapy, Moist heat, scar mobilization, Splintting, Taping, Manual therapy, and Re-evaluation  PLAN FOR NEXT SESSION:  stretching, strengthening, gait, balance, manual, standing balance, RLE stretching 3:14 PM, 06/02/22   Baird Kay, PT 06/02/2022, 3:14 PM

## 2022-06-09 ENCOUNTER — Ambulatory Visit: Payer: Medicare Other | Attending: Adult Health Nurse Practitioner

## 2022-06-09 DIAGNOSIS — M6281 Muscle weakness (generalized): Secondary | ICD-10-CM | POA: Insufficient documentation

## 2022-06-09 DIAGNOSIS — R262 Difficulty in walking, not elsewhere classified: Secondary | ICD-10-CM | POA: Insufficient documentation

## 2022-06-09 DIAGNOSIS — R2681 Unsteadiness on feet: Secondary | ICD-10-CM

## 2022-06-09 DIAGNOSIS — M25661 Stiffness of right knee, not elsewhere classified: Secondary | ICD-10-CM | POA: Diagnosis present

## 2022-06-09 DIAGNOSIS — R278 Other lack of coordination: Secondary | ICD-10-CM | POA: Insufficient documentation

## 2022-06-09 NOTE — Therapy (Signed)
OUTPATIENT PHYSICAL THERAPY NEURO TREATMENT/RECERT     Patient Name: Kelly Rollins MRN: [id number] DOB:02-10-1970, 52 y.o., female Today's Date: 06/09/2022   PCP: Wilford Corner, PA-C REFERRING PROVIDER: Etta Quill, Anette Riedel, PA-C   PT End of Session - 06/09/22 1422     Visit Number 26    Number of Visits 50    Date for PT Re-Evaluation 09/01/22   corrected/addended for recert   Authorization Type [employer]    Authorization Time Period 09/16/21-12/09/21    PT Start Time 1432    PT Stop Time 1514    PT Time Calculation (min) 42 min    Equipment Utilized During Treatment Gait belt    Activity Tolerance Patient tolerated treatment well;Patient limited by fatigue    Behavior During Therapy [place] for tasks assessed/performed                          Past Medical History:  Diagnosis Date   ABDOMINAL PAIN OTHER SPECIFIED SITE 02/21/2009   Qualifier: Diagnosis of  By: Dayton Martes MD, Talia     ABDOMINAL PAIN-LUQ 04/17/2009   Qualifier: Diagnosis of  By: Russella Dar MD Bronson Curb T    Anal fissure    Anginal pain (HCC)    Asthma    Depression    Diabetes mellitus without complication (HCC)    Dyspnea    Dysrhythmia    Headache(784.0)    HEMATOCHEZIA 04/17/2009   Qualifier: Diagnosis of  By: Russella Dar MD Bronson Curb T    Hyperlipidemia    Hypertension    IBS (irritable bowel syndrome)    Migraine headache 11/06/2008   Qualifier: Diagnosis of  By: Dayton Martes MD, Talia     OVARIAN CYST, RIGHT 02/21/2009   Qualifier: Diagnosis of  By: Dayton Martes MD, Talia     PALPITATIONS, OCCASIONAL 04/17/2010   Qualifier: Diagnosis of  By: Dayton Martes MD, Talia     Pancreatitis 10/14/2017   Past Surgical History:  Procedure Laterality Date   BREAST EXCISIONAL BIOPSY Left 1997   ? left side, done at time of reduction , benign   BREAST REDUCTION SURGERY     bilateral   CESAREAN SECTION     CHOLECYSTECTOMY     COLONOSCOPY     ESOPHAGOGASTRODUODENOSCOPY     ESOPHAGOGASTRODUODENOSCOPY  (EGD) WITH PROPOFOL N/A 10/21/2020   Procedure: ESOPHAGOGASTRODUODENOSCOPY (EGD) WITH PROPOFOL;  Surgeon: Regis Bill, MD;  Location: [facility] ENDOSCOPY;  Service: Endoscopy;  Laterality: N/A;   REDUCTION MAMMAPLASTY Bilateral 1997   VAGINAL HYSTERECTOMY     Patient Active Problem List   Diagnosis Date Noted   Abnormal MRI, lumbar spine (01/29/2020) 02/19/2020   Anxiety due to MRI 01/25/2020   Claustrophobia associated to MRI 01/25/2020   Enthesopathy of knee region (Right) 01/25/2020   Enthesopathy of knee region (Left) 01/25/2020   Lumbar facet hypertrophy 01/25/2020   Lumbosacral radiculopathy at S1 (Left) 01/17/2020   Lumbosacral radiculopathy (S1) (Left) 01/17/2020   Chronic lower extremity pain (Bilateral) (L>R) 01/17/2020   History of pancreatitis 07/18/2019   Obstructive sleep apnea syndrome 01/02/2019   Lumbar facet arthropathy (Multilevel) (Bilateral) 12/14/2018   Osteoarthritis involving multiple joints 11/28/2018   Chronic hip pain (Left) 11/16/2018   Acute hip pain (Left) 11/16/2018   DDD (degenerative disc disease), lumbosacral 11/16/2018   Chronic hip pain (Secondary source of pain) (Bilateral) (L>R) 05/19/2017   Elevated C-reactive protein (CRP) 05/19/2017   Elevated sed rate 05/19/2017   Spondylosis without myelopathy or radiculopathy,  lumbosacral region 05/19/2017   Pharmacologic therapy 05/06/2017   Disorder of skeletal system 05/06/2017   Problems influencing health status 05/06/2017   Chest pain with moderate risk of acute coronary syndrome 04/21/2017   DOE (dyspnea on exertion) 04/21/2017   Intermittent palpitations 04/21/2017   Vitamin D insufficiency 05/04/2016   Depression 02/04/2016   Fatty liver 02/04/2016   Hypertension associated with diabetes (HCC) 02/04/2016   Chronic pain syndrome 01/15/2016   Acute low back pain 05/28/2015   Lumbar transverse process fracture (HCC) (Left L1) (03/11/15) 05/28/2015   Long term current use of opiate analgesic  05/01/2015   Long term prescription opiate use 05/01/2015   Opiate use (25 MME/Day) 05/01/2015   Encounter for therapeutic drug level monitoring 05/01/2015   Encounter for pain management planning 05/01/2015   Chronic low back pain (Primary Source of Pain) (Bilateral) (L>R) 05/01/2015   Lumbar facet syndrome (Bilateral) (L>R) 05/01/2015   Chronic knee pain [facility] source of pain) (Bilateral) (L>R) 05/01/2015   Lumbar spondylosis 05/01/2015   Neuropathic pain 05/01/2015   Neurogenic pain 05/01/2015   Myofascial pain 05/01/2015   Essential hypertriglyceridemia 04/26/2014   Type 2 diabetes mellitus with hyperglycemia, with long-term current use of insulin (HCC) 04/26/2014   Anxiety 05/15/2010   Obesity 05/15/2010   Hyperlipidemia 04/03/2010   Fatty liver disease 04/02/2010   Pure hypercholesterolemia 03/19/2010   GERD 04/17/2009   Constipation 04/17/2009   TOBACCO ABUSE 11/06/2008    ONSET DATE: January 2023  REFERRING DIAG: C71.9 (ICD-10-CM) - Malignant neoplasm of brain, unspecified  THERAPY DIAG:  Difficulty in walking, not elsewhere classified  Muscle weakness (generalized)  Other lack of coordination  Unsteadiness on feet  Rationale for Evaluation and Treatment Rehabilitation  SUBJECTIVE:                                                                                                                                                                                              SUBJECTIVE STATEMENT: Pt has noticed her pain level and shoulder stiffness have improved since botox injections. Feeling ok today.  Pt accompanied by:  [name]  PAIN:  Are you having pain? 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 Pt's husband, [name], present with pt. [name] provides majority of hx as pt has  difficulty with speech due to Broca's aphasia (working with SLP). Pt reports partial paralysis of R side. She is unable to move her RUE, RLE also affected. Pt ambulates with hemiwalker  in their home for short distances and does not use WC at home. She fatigues quickly with ambulation. Pt has AFO for RLE but unable to determine how to donn it without it hurting her LE. Pt currently with wound on R calf covered by large bandage that they have been treating for at least a month (reports physician aware). In addition to wound on R calf, pt reports R side skin irritation under breast and lateral trunk due to bra underwire. They have since removed underwire. Pt was hospitalized 10 days in June due to UTI. She has had at least 12 falls in the last six months. Reports no injuries with falls, and states "just bumps and bruises." Pt reports she has constant head pain. Pt spouse reports pt has pain throughout her whole right side down to LE. Can have bad cramps in RUE and RLE. Reports no numbness/tingling. Occ dizziness, unable to describe further. Pt sleeps in recliner, wakes up stiff in the morning. PMH per chart includes: Anorexia, dehydration, Adult Failure to thrive, contracture of muscles multiple sites. Pt./caregiver reports recently being hospitalized with a UTI, and dehydration. Extensive hx, please refer to chart for full details.   PAIN: from eval Are you having pain? Yes: NPRS scale: 3/10 Pain location: Top of head, front Pain description: States, "cancer pain" head pain Aggravating factors: unsure Relieving factors: medications  PRECAUTIONS: Fall  WEIGHT BEARING RESTRICTIONS No  FALLS: Has patient fallen in last 6 months? Yes. Number of falls 12  LIVING ENVIRONMENT: information primarily from chart Lives with: lives with their spouse Lives in: House/apartment Stairs: Yes: External: 4 steps; bilateral but cannot reach both Has following equipment at home: Single point cane, Walker - 4 wheeled,  Hemi walker, shower chair, bed side commode, Grab bars, and lift chair  PLOF: Independent  PATIENT GOALS  "Normal function" as much as possible. Improving her balance.   TODAY'S TREATMENT:    TA: Goal retesting completed on this date for recertification. Please refer to goal section below for details.  TherEx: other interventions completed on this date Gait over 10 meters with HW, close CGA STS 1x5  Gait with HW x 15 meters Nustep lvl 1 x 6 minutes with TC/assist to RLE from PT throughout. Min-mod a for mount/dismount   PATIENT EDUCATION: Education details: Pt educated throughout session about proper posture and technique with exercises. Improved exercise technique, movement at target joints, use of target muscles after min to mod verbal, visual, tactile cues.  Goal reassessment, indications Person educated: pt and her spouse Education method:Explanation, Demonstration, Tactile cues, and Verbal cues, Education comprehension: verbalized understanding, returned demonstration, tactile cues required, and needs further education   HOME EXERCISE PROGRAM:  Pt to perform only as many reps of STS per set as able  Access Code: [email or web address] URL: [email or web address] Date: 11/18/2021 Prepared by: Temple Pacini  Exercises - Elbow Extension with Anchored Resistance  - 1 x daily - 5 x weekly - 3 sets - 10 reps  Access Code: [email or web address] URL: [email or web address] Date: 10/02/2021 Prepared by: Maureen Ralphs  Exercises - Seated Hip Flexion March with Ankle Weights  - 1 x daily - 7 x weekly - 3 sets - 10 reps - Seated Long Arc Quad  - 1 x daily - 7 x weekly - 3 sets - 10 reps - Supine Quad Set  - 1 x daily - 7 x weekly - 3 sets - 10 reps - Proper Sit to Stand Technique  - 1 x daily - 7 x  weekly - 3 sets - 10 reps  GOALS: Goals reviewed with patient? Yes  SHORT TERM GOALS: Target date: 07/21/2022   Patient will be independent in home exercise program to  improve strength/mobility for better functional independence with ADLs. Baseline: to be initiated next 1-2 sessions; 10/17 previously issued, pt is 11/16: to be advanced as pt has gained strength since previous assessment ; 06/09/2022: Pt reports performing her HEP every once in a while Goal status: ONGOING   LONG TERM GOALS: Target date: 03/12/2022   Patient will demo ability to perform at least 1 STS by pushing from [facility] with LUE instead of pulling to stand to improve functional mobility Baseline: Goal changed as pt FOTO tracked by OT, with new goal: pt currently pulls to stand; 11/16: deferred due to pt feeling unwell; 11/22: able to complete with mod assist; 03/03/2022: unable to complete STS with push to stand technique; 06/09/2022 able to complete with mod assist 1x  Goal status: MET   2.   Patient (< 52 years old) will be able to perform and complete five times sit to stand test from standard chair height in <30 seconds to indicate increased LE strength and improved balance. Baseline: Pt able to complete only 1 stand, insufficient strength currently to complete full test; 10/17 31 seconds pull to stand with UE, close CGA-min a 11/16: deferred due to pt feeling unwell; 03/03/2022: 30 sec pull to stand technique used; 06/09/2022: 24 seconds pull to stand  Goal status:  MET  3.  Pt will improve LLE strength by at least 1/2 point on MMT in order to increase ease with transfers and functional mobility. Baseline: LLE grossly 4/5 throughout; 10/17: still generally 4/5 B, however now exhibits AROM with R hip flexion and knee ext; 11/16: 4+/5 LLE, RLE 3-/5, noted improvement however in quads and hip flexors; 06/09/2022: LLE strength grossly 4+/5  Goal status: MET  4.  Patient will increase 10 meter walk test to at least 0.80 m/s as to improve gait speed for better community ambulation and to reduce fall risk. Baseline: to be tested next 1-2 visits; 10/17: pt unable to perform 11/16: deferred due to pt feeling  unwell; 11/21: 0.17 m/s; 03/03/22: 0.13 m/s with HW; 06/09/2022 0.185 m/s with HW Goal status: IN PROGRESS  5.  Pt will complete STS from standard height chair with UUE assist off armrests and with no greater than min assist to improve ease and safety with transfers. Baseline: mod a x2; 10/17: pulling to stand with LUE CGA 11/16: deferred due to pt feeling unwell; 11/21: currently requires mod a to complete; 03/03/2022: pt attempted indep today but unable to complete; 06/09/2022 able to complete with mod assist 1 Goal status: IN PROGRESS,   6.  Pt will improve RLE strength by at least 1/2 point on MMT in order to increase ease with transfers and functional mobility. Baseline: 11/16: 4+/5 LLE, RLE 3-/5, noted improvement however in quads and hip flexors on R; 03/03/2022: 3-/5 RLE, noted increased tone throughout RLE musculature; 06/09/2022: strength still grossly 3-/5 RLE, however R quad strength has improved to 4-/5  Goal status: NEW   ASSESSMENT:  CLINICAL IMPRESSION: Goals retested for recert. Pt exhibits progress AEB making gains on majority of goals, and meeting 5xSTS goal. These improvements indicate increased gait speed and ability, increased bilat LE power and strength and ability to perform transfers. While pt making gains, pt with similar MMT results of RLE compared to prior assessment. Pt with plans for more  botox to RLE within the next couple months. Patient's condition has the potential to improve in response to therapy. Maximum improvement is yet to be obtained. The anticipated improvement is attainable and reasonable in a generally predictable time.   The pt would benefit from continued skilled PT to address immobility, LE muscle weakness, and pain in order to decrease fall risk and increase functional mobility and QOL.   OBJECTIVE IMPAIRMENTS Abnormal gait, decreased activity tolerance, decreased balance, decreased coordination, decreased endurance, decreased mobility, difficulty walking,  decreased ROM, decreased strength, hypomobility, increased edema, increased fascial restrictions, increased muscle spasms, impaired flexibility, impaired sensation, impaired tone, impaired UE functional use, improper body mechanics, postural dysfunction, and pain.   ACTIVITY LIMITATIONS carrying, lifting, bending, sitting, standing, squatting, stairs, transfers, bed mobility, bathing, toileting, dressing, reach over head, hygiene/grooming, locomotion level, and caring for others  PARTICIPATION LIMITATIONS: meal prep, cleaning, laundry, medication management, personal finances, driving, shopping, community activity, occupation, and yard work  PERSONAL [place], Past/current experiences, Sex, and 3+ comorbidities: Anorexia, dehydration, Adult Failure to thrive, contracture of muscles multiple sites, multiple falls, UTI, extensive PMH refer to chart  are also affecting patient's functional outcome.   REHAB POTENTIAL: Fair    CLINICAL DECISION MAKING: Evolving/moderate complexity  EVALUATION COMPLEXITY: High  PLAN: PT FREQUENCY: 2x/week  PT DURATION: 12 weeks  PLANNED INTERVENTIONS: Therapeutic exercises, Therapeutic activity, Neuromuscular re-education, Balance training, Gait training, Patient/Family education, Self Care, Joint mobilization, Joint manipulation, Stair training, Vestibular training, Canalith repositioning, Orthotic/Fit training, DME instructions, Wheelchair mobility training, Spinal mobilization, Cryotherapy, Moist heat, scar mobilization, Splintting, Taping, Manual therapy, and Re-evaluation  PLAN FOR NEXT SESSION:  stretching, strengthening, gait, balance, manual, standing balance, RLE stretching 4:16 PM, 06/09/22   Baird Kay, PT 06/09/2022, 4:16 PM

## 2022-06-09 NOTE — Addendum Note (Signed)
Addended by: Baird Kay on: 06/09/2022 04:17 PM   Modules accepted: Orders

## 2022-06-16 ENCOUNTER — Ambulatory Visit: Payer: Medicare Other

## 2022-06-23 ENCOUNTER — Ambulatory Visit: Payer: Medicare Other

## 2022-06-30 ENCOUNTER — Ambulatory Visit: Payer: Medicare Other

## 2022-06-30 DIAGNOSIS — R262 Difficulty in walking, not elsewhere classified: Secondary | ICD-10-CM

## 2022-06-30 DIAGNOSIS — R2681 Unsteadiness on feet: Secondary | ICD-10-CM

## 2022-06-30 DIAGNOSIS — R278 Other lack of coordination: Secondary | ICD-10-CM

## 2022-06-30 DIAGNOSIS — M25661 Stiffness of right knee, not elsewhere classified: Secondary | ICD-10-CM

## 2022-06-30 DIAGNOSIS — M6281 Muscle weakness (generalized): Secondary | ICD-10-CM

## 2022-06-30 NOTE — Therapy (Signed)
OUTPATIENT PHYSICAL THERAPY NEURO TREATMENT     Patient Name: Kelly Rollins MRN: [id number] DOB:1970/05/29, 52 y.o., female Today's Date: 06/30/2022   PCP: Wilford Corner, PA-C REFERRING PROVIDER: Etta Quill, Anette Riedel, PA-C   PT End of Session - 06/30/22 1746     Visit Number 27    Number of Visits 50    Date for PT Re-Evaluation 09/01/22   corrected/addended for recert   Authorization Type [employer]    Authorization Time Period 09/16/21-12/09/21    PT Start Time 1431    PT Stop Time 1511    PT Time Calculation (min) 40 min    Equipment Utilized During Treatment Gait belt    Activity Tolerance Patient tolerated treatment well;Patient limited by fatigue    Behavior During Therapy [facility] for tasks assessed/performed                          Past Medical History:  Diagnosis Date   ABDOMINAL PAIN OTHER SPECIFIED SITE 02/21/2009   Qualifier: Diagnosis of  By: Dayton Martes MD, Talia     ABDOMINAL PAIN-LUQ 04/17/2009   Qualifier: Diagnosis of  By: Russella Dar MD Bronson Curb T    Anal fissure    Anginal pain (HCC)    Asthma    Depression    Diabetes mellitus without complication (HCC)    Dyspnea    Dysrhythmia    Headache(784.0)    HEMATOCHEZIA 04/17/2009   Qualifier: Diagnosis of  By: Russella Dar MD Bronson Curb T    Hyperlipidemia    Hypertension    IBS (irritable bowel syndrome)    Migraine headache 11/06/2008   Qualifier: Diagnosis of  By: Dayton Martes MD, Talia     OVARIAN CYST, RIGHT 02/21/2009   Qualifier: Diagnosis of  By: Dayton Martes MD, Talia     PALPITATIONS, OCCASIONAL 04/17/2010   Qualifier: Diagnosis of  By: Dayton Martes MD, Talia     Pancreatitis 10/14/2017   Past Surgical History:  Procedure Laterality Date   BREAST EXCISIONAL BIOPSY Left 1997   ? left side, done at time of reduction , benign   BREAST REDUCTION SURGERY     bilateral   CESAREAN SECTION     CHOLECYSTECTOMY     COLONOSCOPY     ESOPHAGOGASTRODUODENOSCOPY     ESOPHAGOGASTRODUODENOSCOPY (EGD)  WITH PROPOFOL N/A 10/21/2020   Procedure: ESOPHAGOGASTRODUODENOSCOPY (EGD) WITH PROPOFOL;  Surgeon: Regis Bill, MD;  Location: [facility] ENDOSCOPY;  Service: Endoscopy;  Laterality: N/A;   REDUCTION MAMMAPLASTY Bilateral 1997   VAGINAL HYSTERECTOMY     Patient Active Problem List   Diagnosis Date Noted   Abnormal MRI, lumbar spine (01/29/2020) 02/19/2020   Anxiety due to MRI 01/25/2020   Claustrophobia associated to MRI 01/25/2020   Enthesopathy of knee region (Right) 01/25/2020   Enthesopathy of knee region (Left) 01/25/2020   Lumbar facet hypertrophy 01/25/2020   Lumbosacral radiculopathy at S1 (Left) 01/17/2020   Lumbosacral radiculopathy (S1) (Left) 01/17/2020   Chronic lower extremity pain (Bilateral) (L>R) 01/17/2020   History of pancreatitis 07/18/2019   Obstructive sleep apnea syndrome 01/02/2019   Lumbar facet arthropathy (Multilevel) (Bilateral) 12/14/2018   Osteoarthritis involving multiple joints 11/28/2018   Chronic hip pain (Left) 11/16/2018   Acute hip pain (Left) 11/16/2018   DDD (degenerative disc disease), lumbosacral 11/16/2018   Chronic hip pain (Secondary source of pain) (Bilateral) (L>R) 05/19/2017   Elevated C-reactive protein (CRP) 05/19/2017   Elevated sed rate 05/19/2017   Spondylosis without myelopathy or radiculopathy,  lumbosacral region 05/19/2017   Pharmacologic therapy 05/06/2017   Disorder of skeletal system 05/06/2017   Problems influencing health status 05/06/2017   Chest pain with moderate risk of acute coronary syndrome 04/21/2017   DOE (dyspnea on exertion) 04/21/2017   Intermittent palpitations 04/21/2017   Vitamin D insufficiency 05/04/2016   Depression 02/04/2016   Fatty liver 02/04/2016   Hypertension associated with diabetes (HCC) 02/04/2016   Chronic pain syndrome 01/15/2016   Acute low back pain 05/28/2015   Lumbar transverse process fracture (HCC) (Left L1) (03/11/15) 05/28/2015   Long term current use of opiate analgesic  05/01/2015   Long term prescription opiate use 05/01/2015   Opiate use (25 MME/Day) 05/01/2015   Encounter for therapeutic drug level monitoring 05/01/2015   Encounter for pain management planning 05/01/2015   Chronic low back pain (Primary Source of Pain) (Bilateral) (L>R) 05/01/2015   Lumbar facet syndrome (Bilateral) (L>R) 05/01/2015   Chronic knee pain [facility] source of pain) (Bilateral) (L>R) 05/01/2015   Lumbar spondylosis 05/01/2015   Neuropathic pain 05/01/2015   Neurogenic pain 05/01/2015   Myofascial pain 05/01/2015   Essential hypertriglyceridemia 04/26/2014   Type 2 diabetes mellitus with hyperglycemia, with long-term current use of insulin (HCC) 04/26/2014   Anxiety 05/15/2010   Obesity 05/15/2010   Hyperlipidemia 04/03/2010   Fatty liver disease 04/02/2010   Pure hypercholesterolemia 03/19/2010   GERD 04/17/2009   Constipation 04/17/2009   TOBACCO ABUSE 11/06/2008    ONSET DATE: January 2023  REFERRING DIAG: C71.9 (ICD-10-CM) - Malignant neoplasm of brain, unspecified  THERAPY DIAG:  Muscle weakness (generalized)  Difficulty in walking, not elsewhere classified  Unsteadiness on feet  Other lack of coordination  Stiffness of right knee, not elsewhere classified  Rationale for Evaluation and Treatment Rehabilitation  SUBJECTIVE:                                                                                                                                                                                              SUBJECTIVE STATEMENT: Pt reports she did not feel good this weekend, unsure why. No other updates.  Pt accompanied by:  [name]  PAIN:  Are you having pain? 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 Pt's husband, Kelly Rollins, present with pt. Kelly Rollins provides majority of  hx as pt has difficulty with speech due to Broca's aphasia (working with SLP). Pt reports partial paralysis of R side. She is unable to move her RUE, RLE also  affected. Pt ambulates with hemiwalker in their home for short distances and does not use WC at home. She fatigues quickly with ambulation. Pt has AFO for RLE but unable to determine how to donn it without it hurting her LE. Pt currently with wound on R calf covered by large bandage that they have been treating for at least a month (reports physician aware). In addition to wound on R calf, pt reports R side skin irritation under breast and lateral trunk due to bra underwire. They have since removed underwire. Pt was hospitalized 10 days in June due to UTI. She has had at least 12 falls in the last six months. Reports no injuries with falls, and states "just bumps and bruises." Pt reports she has constant head pain. Pt spouse reports pt has pain throughout her whole right side down to LE. Can have bad cramps in RUE and RLE. Reports no numbness/tingling. Occ dizziness, unable to describe further. Pt sleeps in recliner, wakes up stiff in the morning. PMH per chart includes: Anorexia, dehydration, Adult Failure to thrive, contracture of muscles multiple sites. Pt./caregiver reports recently being hospitalized with a UTI, and dehydration. Extensive hx, please refer to chart for full details.   PAIN: from eval Are you having pain? Yes: NPRS scale: 3/10 Pain location: Top of head, front Pain description: States, "cancer pain" head pain Aggravating factors: unsure Relieving factors: medications  PRECAUTIONS: Fall  WEIGHT BEARING RESTRICTIONS No  FALLS: Has patient fallen in last 6 months? Yes. Number of falls 12  LIVING ENVIRONMENT: information primarily from chart Lives with: lives with their spouse Lives in: House/apartment Stairs: Yes: External: 4 steps; bilateral but cannot reach both Has following equipment at home: Single point cane, Walker -  4 wheeled, Hemi walker, shower chair, bed side commode, Grab bars, and lift chair  PLOF: Independent  PATIENT GOALS  "Normal function" as much as possible. Improving her balance.   TODAY'S TREATMENT:    TherEx:  Ambulation for endurance with HW  1x148 ft, close CGA throughout. Rates very difficult, completes at slow pace  STS from increased height on mat table 10x - mix of push and pull-to-stand technique, CGA-min a. PT blocks knee for stability  Pt seated in chair LAQ: LLE 10x LLE 2x15 with 2.5# AW donned.   March LLE only with hands-on assist 2x15  PATIENT EDUCATION: Education details: Pt educated throughout session about proper posture and technique with exercises. Improved exercise technique, movement at target joints, use of target muscles after min to mod verbal, visual, tactile cues. Person educated: pt and her spouse Education method:Explanation, Demonstration, Tactile cues, and Verbal cues, Education comprehension: verbalized understanding, returned demonstration, tactile cues required, and needs further education   HOME EXERCISE PROGRAM:  pt to continue HEP as given  Access Code: [email or web address] URL: [email or web address] Date: 11/18/2021 Prepared by: Temple Pacini  Exercises - Elbow Extension with Anchored Resistance  - 1 x daily - 5 x weekly - 3 sets - 10 reps  Access Code: [email or web address] URL: [email or web address] Date: 10/02/2021 Prepared by: Maureen Ralphs  Exercises - Seated Hip Flexion March with Ankle Weights  - 1 x daily - 7 x weekly - 3 sets - 10 reps - Seated Long Arc Quad  - 1 x daily - 7 x weekly - 3 sets - 10 reps - Supine Quad Set  - 1 x daily - 7 x weekly - 3 sets - 10 reps - Proper Sit to Stand Technique  - 1 x daily - 7 x  weekly - 3 sets - 10 reps  GOALS: Goals reviewed with patient? Yes  SHORT TERM GOALS: Target date: 07/21/2022   Patient will be independent in home exercise program to improve strength/mobility for  better functional independence with ADLs. Baseline: to be initiated next 1-2 sessions; 10/17 previously issued, pt is 11/16: to be advanced as pt has gained strength since previous assessment ; 06/09/2022: Pt reports performing her HEP every once in a while Goal status: ONGOING   LONG TERM GOALS: Target date: 03/12/2022   Patient will demo ability to perform at least 1 STS by pushing from [facility] with LUE instead of pulling to stand to improve functional mobility Baseline: Goal changed as pt FOTO tracked by OT, with new goal: pt currently pulls to stand; 11/16: deferred due to pt feeling unwell; 11/22: able to complete with mod assist; 03/03/2022: unable to complete STS with push to stand technique; 06/09/2022 able to complete with mod assist 1x  Goal status: MET   2.   Patient (< 46 years old) will be able to perform and complete five times sit to stand test from standard chair height in <30 seconds to indicate increased LE strength and improved balance. Baseline: Pt able to complete only 1 stand, insufficient strength currently to complete full test; 10/17 31 seconds pull to stand with UE, close CGA-min a 11/16: deferred due to pt feeling unwell; 03/03/2022: 30 sec pull to stand technique used; 06/09/2022: 24 seconds pull to stand  Goal status:  MET  3.  Pt will improve LLE strength by at least 1/2 point on MMT in order to increase ease with transfers and functional mobility. Baseline: LLE grossly 4/5 throughout; 10/17: still generally 4/5 B, however now exhibits AROM with R hip flexion and knee ext; 11/16: 4+/5 LLE, RLE 3-/5, noted improvement however in quads and hip flexors; 06/09/2022: LLE strength grossly 4+/5  Goal status: MET  4.  Patient will increase 10 meter walk test to at least 0.80 m/s as to improve gait speed for better community ambulation and to reduce fall risk. Baseline: to be tested next 1-2 visits; 10/17: pt unable to perform 11/16: deferred due to pt feeling unwell; 11/21: 0.17 m/s;  03/03/22: 0.13 m/s with HW; 06/09/2022 0.185 m/s with HW Goal status: IN PROGRESS  5.  Pt will complete STS from standard height chair with UUE assist off armrests and with no greater than min assist to improve ease and safety with transfers. Baseline: mod a x2; 10/17: pulling to stand with LUE CGA 11/16: deferred due to pt feeling unwell; 11/21: currently requires mod a to complete; 03/03/2022: pt attempted indep today but unable to complete; 06/09/2022 able to complete with mod assist 1 Goal status: IN PROGRESS,   6.  Pt will improve RLE strength by at least 1/2 point on MMT in order to increase ease with transfers and functional mobility. Baseline: 11/16: 4+/5 LLE, RLE 3-/5, noted improvement however in quads and hip flexors on R; 03/03/2022: 3-/5 RLE, noted increased tone throughout RLE musculature; 06/09/2022: strength still grossly 3-/5 RLE, however R quad strength has improved to 4-/5  Goal status: NEW   ASSESSMENT:  CLINICAL IMPRESSION: Pt continues to sustain ambulation with HW for at least 148 ft with slow gait speed but without significantly limiting fatigue. Pt did practice multiple STS today from increased height, which was more challenging. PT had to block pt R knee throughout.  The pt would benefit from continued skilled PT to address immobility, LE muscle weakness,  and pain in order to decrease fall risk and increase functional mobility and QOL.   OBJECTIVE IMPAIRMENTS Abnormal gait, decreased activity tolerance, decreased balance, decreased coordination, decreased endurance, decreased mobility, difficulty walking, decreased ROM, decreased strength, hypomobility, increased edema, increased fascial restrictions, increased muscle spasms, impaired flexibility, impaired sensation, impaired tone, impaired UE functional use, improper body mechanics, postural dysfunction, and pain.   ACTIVITY LIMITATIONS carrying, lifting, bending, sitting, standing, squatting, stairs, transfers, bed mobility,  bathing, toileting, dressing, reach over head, hygiene/grooming, locomotion level, and caring for others  PARTICIPATION LIMITATIONS: meal prep, cleaning, laundry, medication management, personal finances, driving, shopping, community activity, occupation, and yard work  PERSONAL [place], Past/current experiences, Sex, and 3+ comorbidities: Anorexia, dehydration, Adult Failure to thrive, contracture of muscles multiple sites, multiple falls, UTI, extensive PMH refer to chart  are also affecting patient's functional outcome.   REHAB POTENTIAL: Fair    CLINICAL DECISION MAKING: Evolving/moderate complexity  EVALUATION COMPLEXITY: High  PLAN: PT FREQUENCY: 2x/week  PT DURATION: 12 weeks  PLANNED INTERVENTIONS: Therapeutic exercises, Therapeutic activity, Neuromuscular re-education, Balance training, Gait training, Patient/Family education, Self Care, Joint mobilization, Joint manipulation, Stair training, Vestibular training, Canalith repositioning, Orthotic/Fit training, DME instructions, Wheelchair mobility training, Spinal mobilization, Cryotherapy, Moist heat, scar mobilization, Splintting, Taping, Manual therapy, and Re-evaluation  PLAN FOR NEXT SESSION:  stretching, strengthening, gait, balance, manual, standing balance, RLE stretching 5:50 PM, 06/30/22   Baird Kay, PT 06/30/2022, 5:50 PM

## 2022-07-07 ENCOUNTER — Ambulatory Visit: Payer: Medicare Other | Attending: Adult Health Nurse Practitioner

## 2022-07-07 DIAGNOSIS — R262 Difficulty in walking, not elsewhere classified: Secondary | ICD-10-CM | POA: Diagnosis present

## 2022-07-07 DIAGNOSIS — M542 Cervicalgia: Secondary | ICD-10-CM | POA: Insufficient documentation

## 2022-07-07 DIAGNOSIS — R2681 Unsteadiness on feet: Secondary | ICD-10-CM | POA: Diagnosis present

## 2022-07-07 DIAGNOSIS — M6281 Muscle weakness (generalized): Secondary | ICD-10-CM | POA: Diagnosis present

## 2022-07-07 DIAGNOSIS — R278 Other lack of coordination: Secondary | ICD-10-CM | POA: Diagnosis present

## 2022-07-07 DIAGNOSIS — M25661 Stiffness of right knee, not elsewhere classified: Secondary | ICD-10-CM | POA: Insufficient documentation

## 2022-07-07 NOTE — Therapy (Signed)
OUTPATIENT PHYSICAL THERAPY NEURO TREATMENT     Patient Name: Kelly Rollins MRN: [id number] DOB:[birth year], 52 y.o., female Today's Date: 07/07/2022   PCP: Wilford Corner, PA-C REFERRING PROVIDER: Etta Quill, Anette Riedel, PA-C   PT End of Session - 07/07/22 1748     Visit Number 28    Number of Visits 50    Date for PT Re-Evaluation 09/01/22   corrected/addended for recert   Authorization Type [employer]    Authorization Time Period 09/16/21-12/09/21    PT Start Time 1433    PT Stop Time 1514    PT Time Calculation (min) 41 min    Equipment Utilized During Treatment Gait belt    Activity Tolerance Patient limited by fatigue;Patient limited by pain    Behavior During Therapy [facility] for tasks assessed/performed                           Past Medical History:  Diagnosis Date   ABDOMINAL PAIN OTHER SPECIFIED SITE 02/21/2009   Qualifier: Diagnosis of  By: Dayton Martes MD, Talia     ABDOMINAL PAIN-LUQ 04/17/2009   Qualifier: Diagnosis of  By: Russella Dar MD Bronson Curb T    Anal fissure    Anginal pain (HCC)    Asthma    Depression    Diabetes mellitus without complication (HCC)    Dyspnea    Dysrhythmia    Headache(784.0)    HEMATOCHEZIA 04/17/2009   Qualifier: Diagnosis of  By: Russella Dar MD Bronson Curb T    Hyperlipidemia    Hypertension    IBS (irritable bowel syndrome)    Migraine headache 11/06/2008   Qualifier: Diagnosis of  By: Dayton Martes MD, Talia     OVARIAN CYST, RIGHT 02/21/2009   Qualifier: Diagnosis of  By: Dayton Martes MD, Talia     PALPITATIONS, OCCASIONAL 04/17/2010   Qualifier: Diagnosis of  By: Dayton Martes MD, Talia     Pancreatitis 10/14/2017   Past Surgical History:  Procedure Laterality Date   BREAST EXCISIONAL BIOPSY Left 1997   ? left side, done at time of reduction , benign   BREAST REDUCTION SURGERY     bilateral   CESAREAN SECTION     CHOLECYSTECTOMY     COLONOSCOPY     ESOPHAGOGASTRODUODENOSCOPY     ESOPHAGOGASTRODUODENOSCOPY (EGD) WITH  PROPOFOL N/A 10/21/2020   Procedure: ESOPHAGOGASTRODUODENOSCOPY (EGD) WITH PROPOFOL;  Surgeon: Regis Bill, MD;  Location: [facility] ENDOSCOPY;  Service: Endoscopy;  Laterality: N/A;   REDUCTION MAMMAPLASTY Bilateral 1997   VAGINAL HYSTERECTOMY     Patient Active Problem List   Diagnosis Date Noted   Abnormal MRI, lumbar spine (01/29/2020) 02/19/2020   Anxiety due to MRI 01/25/2020   Claustrophobia associated to MRI 01/25/2020   Enthesopathy of knee region (Right) 01/25/2020   Enthesopathy of knee region (Left) 01/25/2020   Lumbar facet hypertrophy 01/25/2020   Lumbosacral radiculopathy at S1 (Left) 01/17/2020   Lumbosacral radiculopathy (S1) (Left) 01/17/2020   Chronic lower extremity pain (Bilateral) (L>R) 01/17/2020   History of pancreatitis 07/18/2019   Obstructive sleep apnea syndrome 01/02/2019   Lumbar facet arthropathy (Multilevel) (Bilateral) 12/14/2018   Osteoarthritis involving multiple joints 11/28/2018   Chronic hip pain (Left) 11/16/2018   Acute hip pain (Left) 11/16/2018   DDD (degenerative disc disease), lumbosacral 11/16/2018   Chronic hip pain (Secondary source of pain) (Bilateral) (L>R) 05/19/2017   Elevated C-reactive protein (CRP) 05/19/2017   Elevated sed rate 05/19/2017   Spondylosis without myelopathy or  radiculopathy, lumbosacral region 05/19/2017   Pharmacologic therapy 05/06/2017   Disorder of skeletal system 05/06/2017   Problems influencing health status 05/06/2017   Chest pain with moderate risk of acute coronary syndrome 04/21/2017   DOE (dyspnea on exertion) 04/21/2017   Intermittent palpitations 04/21/2017   Vitamin D insufficiency 05/04/2016   Depression 02/04/2016   Fatty liver 02/04/2016   Hypertension associated with diabetes (HCC) 02/04/2016   Chronic pain syndrome 01/15/2016   Acute low back pain 05/28/2015   Lumbar transverse process fracture (HCC) (Left L1) (03/11/15) 05/28/2015   Long term current use of opiate analgesic 05/01/2015    Long term prescription opiate use 05/01/2015   Opiate use (25 MME/Day) 05/01/2015   Encounter for therapeutic drug level monitoring 05/01/2015   Encounter for pain management planning 05/01/2015   Chronic low back pain (Primary Source of Pain) (Bilateral) (L>R) 05/01/2015   Lumbar facet syndrome (Bilateral) (L>R) 05/01/2015   Chronic knee pain [facility] source of pain) (Bilateral) (L>R) 05/01/2015   Lumbar spondylosis 05/01/2015   Neuropathic pain 05/01/2015   Neurogenic pain 05/01/2015   Myofascial pain 05/01/2015   Essential hypertriglyceridemia 04/26/2014   Type 2 diabetes mellitus with hyperglycemia, with long-term current use of insulin (HCC) 04/26/2014   Anxiety 05/15/2010   Obesity 05/15/2010   Hyperlipidemia 04/03/2010   Fatty liver disease 04/02/2010   Pure hypercholesterolemia 03/19/2010   GERD 04/17/2009   Constipation 04/17/2009   TOBACCO ABUSE 11/06/2008    ONSET DATE: January 2023  REFERRING DIAG: C71.9 (ICD-10-CM) - Malignant neoplasm of brain, unspecified  THERAPY DIAG:  Cervicalgia  Muscle weakness (generalized)  Difficulty in walking, not elsewhere classified  Unsteadiness on feet  Other lack of coordination  Stiffness of right knee, not elsewhere classified  Rationale for Evaluation and Treatment Rehabilitation  SUBJECTIVE:                                                                                                                                                                                              SUBJECTIVE STATEMENT: Pt reports her pain has been "outrageous," and that it starts in her head (chronic issue). Pt thinks the heat is affecting pain levels. Pt took a Tylenol prior to session.  Pt reports a fall since last seen. She fell (but not all the way to the floor) when she went to sit on the toilet and fell hard onto the toilet seat. She did not hit her head but it did cause her head to forcefully move. She did hit her back and reports  bruising. She does report the pain is getting better. She indicates pain still felt between  shoulder blades.   Pt accompanied by:  [name]  PAIN:  Are you having pain? 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 Pt's husband, [name], present with pt. [name] provides majority of hx as pt has difficulty with speech due to Broca's aphasia (working with SLP). Pt reports partial paralysis of R side. She is unable to move her RUE, RLE also affected. Pt ambulates with hemiwalker in their home for short distances and does not use WC at home. She fatigues quickly with ambulation. Pt has AFO for RLE but unable to determine how to donn it without it hurting her LE. Pt currently with wound on R calf covered by large bandage that they have been treating for at least a month (reports physician aware). In addition to wound on R calf, pt reports R side skin irritation under breast and lateral trunk due to bra underwire. They have since removed underwire. Pt was hospitalized 10 days in June due to UTI. She has had at least 12 falls in the last six months. Reports no injuries with falls, and states "just bumps and bruises." Pt reports she has constant head pain. Pt spouse reports pt has pain throughout her whole right side down to LE. Can have bad cramps in RUE and RLE. Reports no numbness/tingling. Occ dizziness, unable to describe further. Pt sleeps in recliner, wakes up stiff in the morning. PMH per chart includes: Anorexia, dehydration, Adult Failure to thrive, contracture of muscles multiple sites. Pt./caregiver reports recently being hospitalized with a UTI, and dehydration. Extensive hx, please refer to chart for full details.   PAIN: from eval Are you having pain? Yes: NPRS scale: 3/10 Pain location: Top of head,  front Pain description: States, "cancer pain" head pain Aggravating factors: unsure Relieving factors: medications  PRECAUTIONS: Fall  WEIGHT BEARING RESTRICTIONS No  FALLS: Has patient fallen in last 6 months? Yes. Number of falls 12  LIVING ENVIRONMENT: information primarily from chart Lives with: lives with their spouse Lives in: House/apartment Stairs: Yes: External: 4 steps; bilateral but cannot reach both Has following equipment at home: Single point cane, Walker - 4 wheeled, Hemi walker, shower chair, bed side commode, Grab bars, and lift chair  PLOF: Independent  PATIENT GOALS  "Normal function" as much as possible. Improving her balance.   TODAY'S TREATMENT:  07/07/22  TherEx:  Ambulation for endurance with HW 1x142 ft, close CGA throughout. Requests rest break 2x. Pt more fatigued today.  STS 4x with min assist from [facility] to [facility]. One loud caviation heard in R knee with 4th STS. Pt reports no pain with cavitation. No pain with PROM knee ext/flex following intervention.  Manual:  Pt seated in WC. PT provides STM at first to L UT/levator/posterior shoulder mm ,however, pt reports too pain-limited. PT then provides STM to R side UT/levator/posterior shoulder mm. Pt reports this is initially relieving. Terminated once pt reported some pain felt in L UT, but she did state that her L side/shoulder felt better following manual therapy.   PATIENT EDUCATION: Education details: Pt educated throughout session about proper posture and technique with exercises. Improved exercise technique, movement at target joints, use of target muscles after min to mod verbal, visual, tactile cues. Person educated: pt and her spouse Education method:Explanation, Demonstration, Tactile cues, and Verbal cues, Education comprehension: verbalized understanding, returned  demonstration, tactile cues required, and needs further education   HOME EXERCISE PROGRAM:  pt to continue HEP as given  Access  Code: [email or web address] URL: [email or web address] Date: 11/18/2021 Prepared by: Temple Pacini  Exercises - Elbow Extension with Anchored Resistance  - 1 x daily - 5 x weekly - 3 sets - 10 reps  Access Code: [email or web address] URL: [email or web address] Date: 10/02/2021 Prepared by: Maureen Ralphs  Exercises - Seated Hip Flexion March with Ankle Weights  - 1 x daily - 7 x weekly - 3 sets - 10 reps - Seated Long Arc Quad  - 1 x daily - 7 x weekly - 3 sets - 10 reps - Supine Quad Set  - 1 x daily - 7 x weekly - 3 sets - 10 reps - Proper Sit to Stand Technique  - 1 x daily - 7 x weekly - 3 sets - 10 reps  GOALS: Goals reviewed with patient? Yes  SHORT TERM GOALS: Target date: 07/21/2022   Patient will be independent in home exercise program to improve strength/mobility for better functional independence with ADLs. Baseline: to be initiated next 1-2 sessions; 10/17 previously issued, pt is 11/16: to be advanced as pt has gained strength since previous assessment ; 06/09/2022: Pt reports performing her HEP every once in a while Goal status: ONGOING   LONG TERM GOALS: Target date: 03/12/2022   Patient will demo ability to perform at least 1 STS by pushing from [facility] with LUE instead of pulling to stand to improve functional mobility Baseline: Goal changed as pt FOTO tracked by OT, with new goal: pt currently pulls to stand; 11/16: deferred due to pt feeling unwell; 11/22: able to complete with mod assist; 03/03/2022: unable to complete STS with push to stand technique; 06/09/2022 able to complete with mod assist 1x  Goal status: MET   2.   Patient (< 82 years old) will be able to perform and complete five times sit to stand test from standard chair height in <30 seconds to indicate increased LE strength and improved balance. Baseline: Pt able to complete only 1 stand, insufficient strength currently to complete full test; 10/17 31 seconds pull to stand with UE, close CGA-min a 11/16:  deferred due to pt feeling unwell; 03/03/2022: 30 sec pull to stand technique used; 06/09/2022: 24 seconds pull to stand  Goal status:  MET  3.  Pt will improve LLE strength by at least 1/2 point on MMT in order to increase ease with transfers and functional mobility. Baseline: LLE grossly 4/5 throughout; 10/17: still generally 4/5 B, however now exhibits AROM with R hip flexion and knee ext; 11/16: 4+/5 LLE, RLE 3-/5, noted improvement however in quads and hip flexors; 06/09/2022: LLE strength grossly 4+/5  Goal status: MET  4.  Patient will increase 10 meter walk test to at least 0.80 m/s as to improve gait speed for better community ambulation and to reduce fall risk. Baseline: to be tested next 1-2 visits; 10/17: pt unable to perform 11/16: deferred due to pt feeling unwell; 11/21: 0.17 m/s; 03/03/22: 0.13 m/s with HW; 06/09/2022 0.185 m/s with HW Goal status: IN PROGRESS  5.  Pt will complete STS from standard height chair with UUE assist off armrests and with no greater than min assist to improve ease and safety with transfers. Baseline: mod a x2; 10/17: pulling to stand with LUE CGA 11/16: deferred due to pt feeling unwell; 11/21: currently requires mod a to complete; 03/03/2022: pt attempted indep today but unable to  complete; 06/09/2022 able to complete with mod assist 1 Goal status: IN PROGRESS,   6.  Pt will improve RLE strength by at least 1/2 point on MMT in order to increase ease with transfers and functional mobility. Baseline: 11/16: 4+/5 LLE, RLE 3-/5, noted improvement however in quads and hip flexors on R; 03/03/2022: 3-/5 RLE, noted increased tone throughout RLE musculature; 06/09/2022: strength still grossly 3-/5 RLE, however R quad strength has improved to 4-/5  Goal status: NEW   ASSESSMENT:  CLINICAL IMPRESSION: Pt somewhat more fatigued today and reports a fall since last seen and reports some pain in back/between shoulder blades as a result. All interventions were performed to pt  tolerance. She did experience some relief following manual therapy to R shoulder/UT/levator mm, but this too was somewhat pain-limited at one point. Will continue to monitor. The pt would benefit from continued skilled PT to address immobility, LE muscle weakness, and pain in order to decrease fall risk and increase functional mobility and QOL.   OBJECTIVE IMPAIRMENTS Abnormal gait, decreased activity tolerance, decreased balance, decreased coordination, decreased endurance, decreased mobility, difficulty walking, decreased ROM, decreased strength, hypomobility, increased edema, increased fascial restrictions, increased muscle spasms, impaired flexibility, impaired sensation, impaired tone, impaired UE functional use, improper body mechanics, postural dysfunction, and pain.   ACTIVITY LIMITATIONS carrying, lifting, bending, sitting, standing, squatting, stairs, transfers, bed mobility, bathing, toileting, dressing, reach over head, hygiene/grooming, locomotion level, and caring for others  PARTICIPATION LIMITATIONS: meal prep, cleaning, laundry, medication management, personal finances, driving, shopping, community activity, occupation, and yard work  PERSONAL [place], Past/current experiences, Sex, and 3+ comorbidities: Anorexia, dehydration, Adult Failure to thrive, contracture of muscles multiple sites, multiple falls, UTI, extensive PMH refer to chart  are also affecting patient's functional outcome.   REHAB POTENTIAL: Fair    CLINICAL DECISION MAKING: Evolving/moderate complexity  EVALUATION COMPLEXITY: High  PLAN: PT FREQUENCY: 2x/week  PT DURATION: 12 weeks  PLANNED INTERVENTIONS: Therapeutic exercises, Therapeutic activity, Neuromuscular re-education, Balance training, Gait training, Patient/Family education, Self Care, Joint mobilization, Joint manipulation, Stair training, Vestibular training, Canalith repositioning, Orthotic/Fit training, DME instructions, Wheelchair mobility  training, Spinal mobilization, Cryotherapy, Moist heat, scar mobilization, Splintting, Taping, Manual therapy, and Re-evaluation  PLAN FOR NEXT SESSION:  stretching, strengthening, gait, balance, manual, standing balance, RLE stretching 5:56 PM, 07/07/22   Baird Kay, PT 07/07/2022, 5:56 PM

## 2022-07-14 ENCOUNTER — Ambulatory Visit: Payer: Medicare Other

## 2022-07-14 DIAGNOSIS — M542 Cervicalgia: Secondary | ICD-10-CM | POA: Diagnosis not present

## 2022-07-14 DIAGNOSIS — M25661 Stiffness of right knee, not elsewhere classified: Secondary | ICD-10-CM

## 2022-07-14 DIAGNOSIS — R278 Other lack of coordination: Secondary | ICD-10-CM

## 2022-07-14 DIAGNOSIS — R262 Difficulty in walking, not elsewhere classified: Secondary | ICD-10-CM

## 2022-07-14 DIAGNOSIS — M6281 Muscle weakness (generalized): Secondary | ICD-10-CM

## 2022-07-14 DIAGNOSIS — R2681 Unsteadiness on feet: Secondary | ICD-10-CM

## 2022-07-14 NOTE — Therapy (Signed)
OUTPATIENT PHYSICAL THERAPY NEURO TREATMENT     Patient Name: Kelly Rollins MRN: [id number] DOB:1970/04/10, 53 y.o., female Today's Date: 07/14/2022   PCP: Wilford Corner, PA-C REFERRING PROVIDER: Etta Quill, Anette Riedel, PA-C   PT End of Session - 07/14/22 1615     Visit Number 29    Number of Visits 50    Date for PT Re-Evaluation 09/01/22   corrected/addended for recert   Authorization Type [employer]    Authorization Time Period 09/16/21-12/09/21    PT Start Time 1433    PT Stop Time 1513    PT Time Calculation (min) 40 min    Equipment Utilized During Treatment Gait belt    Activity Tolerance Patient limited by fatigue;Patient tolerated treatment well    Behavior During Therapy [facility] for tasks assessed/performed                           Past Medical History:  Diagnosis Date   ABDOMINAL PAIN OTHER SPECIFIED SITE 02/21/2009   Qualifier: Diagnosis of  By: Dayton Martes MD, Talia     ABDOMINAL PAIN-LUQ 04/17/2009   Qualifier: Diagnosis of  By: Russella Dar MD Bronson Curb T    Anal fissure    Anginal pain (HCC)    Asthma    Depression    Diabetes mellitus without complication (HCC)    Dyspnea    Dysrhythmia    Headache(784.0)    HEMATOCHEZIA 04/17/2009   Qualifier: Diagnosis of  By: Russella Dar MD Bronson Curb T    Hyperlipidemia    Hypertension    IBS (irritable bowel syndrome)    Migraine headache 11/06/2008   Qualifier: Diagnosis of  By: Dayton Martes MD, Talia     OVARIAN CYST, RIGHT 02/21/2009   Qualifier: Diagnosis of  By: Dayton Martes MD, Talia     PALPITATIONS, OCCASIONAL 04/17/2010   Qualifier: Diagnosis of  By: Dayton Martes MD, Talia     Pancreatitis 10/14/2017   Past Surgical History:  Procedure Laterality Date   BREAST EXCISIONAL BIOPSY Left 1997   ? left side, done at time of reduction , benign   BREAST REDUCTION SURGERY     bilateral   CESAREAN SECTION     CHOLECYSTECTOMY     COLONOSCOPY     ESOPHAGOGASTRODUODENOSCOPY     ESOPHAGOGASTRODUODENOSCOPY  (EGD) WITH PROPOFOL N/A 10/21/2020   Procedure: ESOPHAGOGASTRODUODENOSCOPY (EGD) WITH PROPOFOL;  Surgeon: Regis Bill, MD;  Location: [facility] ENDOSCOPY;  Service: Endoscopy;  Laterality: N/A;   REDUCTION MAMMAPLASTY Bilateral 1997   VAGINAL HYSTERECTOMY     Patient Active Problem List   Diagnosis Date Noted   Abnormal MRI, lumbar spine (01/29/2020) 02/19/2020   Anxiety due to MRI 01/25/2020   Claustrophobia associated to MRI 01/25/2020   Enthesopathy of knee region (Right) 01/25/2020   Enthesopathy of knee region (Left) 01/25/2020   Lumbar facet hypertrophy 01/25/2020   Lumbosacral radiculopathy at S1 (Left) 01/17/2020   Lumbosacral radiculopathy (S1) (Left) 01/17/2020   Chronic lower extremity pain (Bilateral) (L>R) 01/17/2020   History of pancreatitis 07/18/2019   Obstructive sleep apnea syndrome 01/02/2019   Lumbar facet arthropathy (Multilevel) (Bilateral) 12/14/2018   Osteoarthritis involving multiple joints 11/28/2018   Chronic hip pain (Left) 11/16/2018   Acute hip pain (Left) 11/16/2018   DDD (degenerative disc disease), lumbosacral 11/16/2018   Chronic hip pain (Secondary source of pain) (Bilateral) (L>R) 05/19/2017   Elevated C-reactive protein (CRP) 05/19/2017   Elevated sed rate 05/19/2017   Spondylosis without myelopathy or  radiculopathy, lumbosacral region 05/19/2017   Pharmacologic therapy 05/06/2017   Disorder of skeletal system 05/06/2017   Problems influencing health status 05/06/2017   Chest pain with moderate risk of acute coronary syndrome 04/21/2017   DOE (dyspnea on exertion) 04/21/2017   Intermittent palpitations 04/21/2017   Vitamin D insufficiency 05/04/2016   Depression 02/04/2016   Fatty liver 02/04/2016   Hypertension associated with diabetes (HCC) 02/04/2016   Chronic pain syndrome 01/15/2016   Acute low back pain 05/28/2015   Lumbar transverse process fracture (HCC) (Left L1) (03/11/15) 05/28/2015   Long term current use of opiate analgesic  05/01/2015   Long term prescription opiate use 05/01/2015   Opiate use (25 MME/Day) 05/01/2015   Encounter for therapeutic drug level monitoring 05/01/2015   Encounter for pain management planning 05/01/2015   Chronic low back pain (Primary Source of Pain) (Bilateral) (L>R) 05/01/2015   Lumbar facet syndrome (Bilateral) (L>R) 05/01/2015   Chronic knee pain [facility] source of pain) (Bilateral) (L>R) 05/01/2015   Lumbar spondylosis 05/01/2015   Neuropathic pain 05/01/2015   Neurogenic pain 05/01/2015   Myofascial pain 05/01/2015   Essential hypertriglyceridemia 04/26/2014   Type 2 diabetes mellitus with hyperglycemia, with long-term current use of insulin (HCC) 04/26/2014   Anxiety 05/15/2010   Obesity 05/15/2010   Hyperlipidemia 04/03/2010   Fatty liver disease 04/02/2010   Pure hypercholesterolemia 03/19/2010   GERD 04/17/2009   Constipation 04/17/2009   TOBACCO ABUSE 11/06/2008    ONSET DATE: January 2023  REFERRING DIAG: C71.9 (ICD-10-CM) - Malignant neoplasm of brain, unspecified  THERAPY DIAG:  Muscle weakness (generalized)  Difficulty in walking, not elsewhere classified  Other lack of coordination  Unsteadiness on feet  Stiffness of right knee, not elsewhere classified  Rationale for Evaluation and Treatment Rehabilitation  SUBJECTIVE:                                                                                                                                                                                              SUBJECTIVE STATEMENT: Pt reports she is tired today. Pt reports her back hurts about the same as last visit.  Pt accompanied by:  [name]  PAIN:  Are you having pain? 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 Pt's husband, [name],  present with pt. [name] provides majority of hx as pt has difficulty with speech due to Broca's aphasia (working with SLP). Pt reports partial paralysis of R side. She is unable  to move her RUE, RLE also affected. Pt ambulates with hemiwalker in their home for short distances and does not use WC at home. She fatigues quickly with ambulation. Pt has AFO for RLE but unable to determine how to donn it without it hurting her LE. Pt currently with wound on R calf covered by large bandage that they have been treating for at least a month (reports physician aware). In addition to wound on R calf, pt reports R side skin irritation under breast and lateral trunk due to bra underwire. They have since removed underwire. Pt was hospitalized 10 days in June due to UTI. She has had at least 12 falls in the last six months. Reports no injuries with falls, and states "just bumps and bruises." Pt reports she has constant head pain. Pt spouse reports pt has pain throughout her whole right side down to LE. Can have bad cramps in RUE and RLE. Reports no numbness/tingling. Occ dizziness, unable to describe further. Pt sleeps in recliner, wakes up stiff in the morning. PMH per chart includes: Anorexia, dehydration, Adult Failure to thrive, contracture of muscles multiple sites. Pt./caregiver reports recently being hospitalized with a UTI, and dehydration. Extensive hx, please refer to chart for full details.   PAIN: from eval Are you having pain? Yes: NPRS scale: 3/10 Pain location: Top of head, front Pain description: States, "cancer pain" head pain Aggravating factors: unsure Relieving factors: medications  PRECAUTIONS: Fall  WEIGHT BEARING RESTRICTIONS No  FALLS: Has patient fallen in last 6 months? Yes. Number of falls 12  LIVING ENVIRONMENT: information primarily from chart Lives with: lives with their spouse Lives in: House/apartment Stairs: Yes: External: 4 steps; bilateral but cannot reach both Has following  equipment at home: Single point cane, Walker - 4 wheeled, Hemi walker, shower chair, bed side commode, Grab bars, and lift chair  PLOF: Independent  PATIENT GOALS  "Normal function" as much as possible. Improving her balance.   TODAY'S TREATMENT:  07/14/22  TherEx:  Brief ambulation with HW and close CGA from WC to nustep x 12 ft.   Nustep (seat 10) endurance training x 10 min total @ lvl 1, PT assisting throughout with holding RLE in place and adjusting RLE positioning throughout due to LE tone.  Ambulation for endurance with HW and WC follow 1x148 ft, close CGA throughout.   STS 4x with min assist from [facility] to [facility]. Cuing for eccentric control   PATIENT EDUCATION: Education details: Pt educated throughout session about proper posture and technique with exercises. Improved exercise technique, movement at target joints, use of target muscles after min to mod verbal, visual, tactile cues. Person educated: pt and her spouse Education method:Explanation, Demonstration, Tactile cues, and Verbal cues, Education comprehension: verbalized understanding, returned demonstration, tactile cues required, and needs further education   HOME EXERCISE PROGRAM:  pt to continue HEP as given  Access Code: [email or web address] URL: [email or web address] Date: 11/18/2021 Prepared by: Temple Pacini  Exercises - Elbow Extension with Anchored Resistance  - 1 x daily - 5 x weekly - 3 sets - 10 reps  Access Code: [email or web address] URL: [email or web address] Date: 10/02/2021 Prepared by: Maureen Ralphs  Exercises - Seated Hip Flexion March with Ankle Weights  - 1 x daily - 7 x weekly - 3 sets - 10 reps - Seated Long Arc Quad  - 1 x daily - 7 x weekly - 3 sets - 10 reps - Supine Quad Set  - 1 x daily - 7 x weekly - 3 sets -  10 reps - Proper Sit to Stand Technique  - 1 x daily - 7 x weekly - 3 sets - 10 reps  GOALS: Goals reviewed with patient? Yes  SHORT TERM GOALS: Target date:  07/21/2022   Patient will be independent in home exercise program to improve strength/mobility for better functional independence with ADLs. Baseline: to be initiated next 1-2 sessions; 10/17 previously issued, pt is 11/16: to be advanced as pt has gained strength since previous assessment ; 06/09/2022: Pt reports performing her HEP every once in a while Goal status: ONGOING   LONG TERM GOALS: Target date: 03/12/2022   Patient will demo ability to perform at least 1 STS by pushing from [facility] with LUE instead of pulling to stand to improve functional mobility Baseline: Goal changed as pt FOTO tracked by OT, with new goal: pt currently pulls to stand; 11/16: deferred due to pt feeling unwell; 11/22: able to complete with mod assist; 03/03/2022: unable to complete STS with push to stand technique; 06/09/2022 able to complete with mod assist 1x  Goal status: MET   2.   Patient (< 9 years old) will be able to perform and complete five times sit to stand test from standard chair height in <30 seconds to indicate increased LE strength and improved balance. Baseline: Pt able to complete only 1 stand, insufficient strength currently to complete full test; 10/17 31 seconds pull to stand with UE, close CGA-min a 11/16: deferred due to pt feeling unwell; 03/03/2022: 30 sec pull to stand technique used; 06/09/2022: 24 seconds pull to stand  Goal status:  MET  3.  Pt will improve LLE strength by at least 1/2 point on MMT in order to increase ease with transfers and functional mobility. Baseline: LLE grossly 4/5 throughout; 10/17: still generally 4/5 B, however now exhibits AROM with R hip flexion and knee ext; 11/16: 4+/5 LLE, RLE 3-/5, noted improvement however in quads and hip flexors; 06/09/2022: LLE strength grossly 4+/5  Goal status: MET  4.  Patient will increase 10 meter walk test to at least 0.80 m/s as to improve gait speed for better community ambulation and to reduce fall risk. Baseline: to be tested next 1-2  visits; 10/17: pt unable to perform 11/16: deferred due to pt feeling unwell; 11/21: 0.17 m/s; 03/03/22: 0.13 m/s with HW; 06/09/2022 0.185 m/s with HW Goal status: IN PROGRESS  5.  Pt will complete STS from standard height chair with UUE assist off armrests and with no greater than min assist to improve ease and safety with transfers. Baseline: mod a x2; 10/17: pulling to stand with LUE CGA 11/16: deferred due to pt feeling unwell; 11/21: currently requires mod a to complete; 03/03/2022: pt attempted indep today but unable to complete; 06/09/2022 able to complete with mod assist 1 Goal status: IN PROGRESS,   6.  Pt will improve RLE strength by at least 1/2 point on MMT in order to increase ease with transfers and functional mobility. Baseline: 11/16: 4+/5 LLE, RLE 3-/5, noted improvement however in quads and hip flexors on R; 03/03/2022: 3-/5 RLE, noted increased tone throughout RLE musculature; 06/09/2022: strength still grossly 3-/5 RLE, however R quad strength has improved to 4-/5  Goal status: NEW   ASSESSMENT:  CLINICAL IMPRESSION: Pt presents reporting continued fatigue, however, is able to advance nustep endurance training today and immediately followed this with 148 ft of ambulation with HW (see above). This indicates increased activity tolerance and endurance. The pt would benefit from continued skilled  PT to address immobility, LE muscle weakness, and pain in order to decrease fall risk and increase functional mobility and QOL.   OBJECTIVE IMPAIRMENTS Abnormal gait, decreased activity tolerance, decreased balance, decreased coordination, decreased endurance, decreased mobility, difficulty walking, decreased ROM, decreased strength, hypomobility, increased edema, increased fascial restrictions, increased muscle spasms, impaired flexibility, impaired sensation, impaired tone, impaired UE functional use, improper body mechanics, postural dysfunction, and pain.   ACTIVITY LIMITATIONS carrying,  lifting, bending, sitting, standing, squatting, stairs, transfers, bed mobility, bathing, toileting, dressing, reach over head, hygiene/grooming, locomotion level, and caring for others  PARTICIPATION LIMITATIONS: meal prep, cleaning, laundry, medication management, personal finances, driving, shopping, community activity, occupation, and yard work  PERSONAL [place], Past/current experiences, Sex, and 3+ comorbidities: Anorexia, dehydration, Adult Failure to thrive, contracture of muscles multiple sites, multiple falls, UTI, extensive PMH refer to chart  are also affecting patient's functional outcome.   REHAB POTENTIAL: Fair    CLINICAL DECISION MAKING: Evolving/moderate complexity  EVALUATION COMPLEXITY: High  PLAN: PT FREQUENCY: 2x/week  PT DURATION: 12 weeks  PLANNED INTERVENTIONS: Therapeutic exercises, Therapeutic activity, Neuromuscular re-education, Balance training, Gait training, Patient/Family education, Self Care, Joint mobilization, Joint manipulation, Stair training, Vestibular training, Canalith repositioning, Orthotic/Fit training, DME instructions, Wheelchair mobility training, Spinal mobilization, Cryotherapy, Moist heat, scar mobilization, Splintting, Taping, Manual therapy, and Re-evaluation  PLAN FOR NEXT SESSION:  stretching, strengthening, gait, balance, manual, standing balance, RLE stretching 4:18 PM, 07/14/22   Baird Kay, PT 07/14/2022, 4:18 PM

## 2022-07-21 ENCOUNTER — Ambulatory Visit: Payer: Medicare Other

## 2022-07-28 ENCOUNTER — Ambulatory Visit: Payer: Medicare Other

## 2022-07-28 DIAGNOSIS — M542 Cervicalgia: Secondary | ICD-10-CM | POA: Diagnosis not present

## 2022-07-28 DIAGNOSIS — R2681 Unsteadiness on feet: Secondary | ICD-10-CM

## 2022-07-28 DIAGNOSIS — M6281 Muscle weakness (generalized): Secondary | ICD-10-CM

## 2022-07-28 DIAGNOSIS — R262 Difficulty in walking, not elsewhere classified: Secondary | ICD-10-CM

## 2022-07-28 DIAGNOSIS — R278 Other lack of coordination: Secondary | ICD-10-CM

## 2022-07-28 NOTE — Therapy (Signed)
OUTPATIENT PHYSICAL THERAPY NEURO TREATMENT/Physical Therapy Progress Note   Dates of reporting period  03/03/2022   to   07/28/2022      Patient Name: Kelly Rollins MRN: [id number] DOB:[birth year], 52 y.o., female Today's Date: 07/28/2022   PCP: Wilford Corner, PA-C REFERRING PROVIDER: Etta Quill, Anette Riedel, PA-C   PT End of Session - 07/28/22 1417     Visit Number 30    Number of Visits 50    Date for PT Re-Evaluation 09/01/22   corrected/addended for recert   Authorization Type [employer]    Authorization Time Period 09/16/21-12/09/21    PT Start Time 1422    PT Stop Time 1510    PT Time Calculation (min) 48 min    Equipment Utilized During Treatment Gait belt    Activity Tolerance Patient limited by fatigue;Patient tolerated treatment well    Behavior During Therapy [facility] for tasks assessed/performed                           Past Medical History:  Diagnosis Date   ABDOMINAL PAIN OTHER SPECIFIED SITE 02/21/2009   Qualifier: Diagnosis of  By: Dayton Martes MD, Talia     ABDOMINAL PAIN-LUQ 04/17/2009   Qualifier: Diagnosis of  By: Russella Dar MD Bronson Curb T    Anal fissure    Anginal pain (HCC)    Asthma    Depression    Diabetes mellitus without complication (HCC)    Dyspnea    Dysrhythmia    Headache(784.0)    HEMATOCHEZIA 04/17/2009   Qualifier: Diagnosis of  By: Russella Dar MD Bronson Curb T    Hyperlipidemia    Hypertension    IBS (irritable bowel syndrome)    Migraine headache 11/06/2008   Qualifier: Diagnosis of  By: Dayton Martes MD, Talia     OVARIAN CYST, RIGHT 02/21/2009   Qualifier: Diagnosis of  By: Dayton Martes MD, Talia     PALPITATIONS, OCCASIONAL 04/17/2010   Qualifier: Diagnosis of  By: Dayton Martes MD, Talia     Pancreatitis 10/14/2017   Past Surgical History:  Procedure Laterality Date   BREAST EXCISIONAL BIOPSY Left 1997   ? left side, done at time of reduction , benign   BREAST REDUCTION SURGERY     bilateral   CESAREAN SECTION      CHOLECYSTECTOMY     COLONOSCOPY     ESOPHAGOGASTRODUODENOSCOPY     ESOPHAGOGASTRODUODENOSCOPY (EGD) WITH PROPOFOL N/A 10/21/2020   Procedure: ESOPHAGOGASTRODUODENOSCOPY (EGD) WITH PROPOFOL;  Surgeon: Regis Bill, MD;  Location: [facility] ENDOSCOPY;  Service: Endoscopy;  Laterality: N/A;   REDUCTION MAMMAPLASTY Bilateral 1997   VAGINAL HYSTERECTOMY     Patient Active Problem List   Diagnosis Date Noted   Abnormal MRI, lumbar spine (01/29/2020) 02/19/2020   Anxiety due to MRI 01/25/2020   Claustrophobia associated to MRI 01/25/2020   Enthesopathy of knee region (Right) 01/25/2020   Enthesopathy of knee region (Left) 01/25/2020   Lumbar facet hypertrophy 01/25/2020   Lumbosacral radiculopathy at S1 (Left) 01/17/2020   Lumbosacral radiculopathy (S1) (Left) 01/17/2020   Chronic lower extremity pain (Bilateral) (L>R) 01/17/2020   History of pancreatitis 07/18/2019   Obstructive sleep apnea syndrome 01/02/2019   Lumbar facet arthropathy (Multilevel) (Bilateral) 12/14/2018   Osteoarthritis involving multiple joints 11/28/2018   Chronic hip pain (Left) 11/16/2018   Acute hip pain (Left) 11/16/2018   DDD (degenerative disc disease), lumbosacral 11/16/2018   Chronic hip pain (Secondary source of pain) (Bilateral) (L>R) 05/19/2017  Elevated C-reactive protein (CRP) 05/19/2017   Elevated sed rate 05/19/2017   Spondylosis without myelopathy or radiculopathy, lumbosacral region 05/19/2017   Pharmacologic therapy 05/06/2017   Disorder of skeletal system 05/06/2017   Problems influencing health status 05/06/2017   Chest pain with moderate risk of acute coronary syndrome 04/21/2017   DOE (dyspnea on exertion) 04/21/2017   Intermittent palpitations 04/21/2017   Vitamin D insufficiency 05/04/2016   Depression 02/04/2016   Fatty liver 02/04/2016   Hypertension associated with diabetes (HCC) 02/04/2016   Chronic pain syndrome 01/15/2016   Acute low back pain 05/28/2015   Lumbar transverse  process fracture (HCC) (Left L1) (03/11/15) 05/28/2015   Long term current use of opiate analgesic 05/01/2015   Long term prescription opiate use 05/01/2015   Opiate use (25 MME/Day) 05/01/2015   Encounter for therapeutic drug level monitoring 05/01/2015   Encounter for pain management planning 05/01/2015   Chronic low back pain (Primary Source of Pain) (Bilateral) (L>R) 05/01/2015   Lumbar facet syndrome (Bilateral) (L>R) 05/01/2015   Chronic knee pain [facility] source of pain) (Bilateral) (L>R) 05/01/2015   Lumbar spondylosis 05/01/2015   Neuropathic pain 05/01/2015   Neurogenic pain 05/01/2015   Myofascial pain 05/01/2015   Essential hypertriglyceridemia 04/26/2014   Type 2 diabetes mellitus with hyperglycemia, with long-term current use of insulin (HCC) 04/26/2014   Anxiety 05/15/2010   Obesity 05/15/2010   Hyperlipidemia 04/03/2010   Fatty liver disease 04/02/2010   Pure hypercholesterolemia 03/19/2010   GERD 04/17/2009   Constipation 04/17/2009   TOBACCO ABUSE 11/06/2008    ONSET DATE: January 2023  REFERRING DIAG: C71.9 (ICD-10-CM) - Malignant neoplasm of brain, unspecified  THERAPY DIAG:  Muscle weakness (generalized)  Difficulty in walking, not elsewhere classified  Unsteadiness on feet  Other lack of coordination  Rationale for Evaluation and Treatment Rehabilitation  SUBJECTIVE:                                                                                                                                                                                              SUBJECTIVE STATEMENT: Pt reports she is tired today she has been moving around more at home. She does say her pain is a bit worse as a result.   Pt accompanied by:  [name]  PAIN:  Are you having pain? 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 Pt's husband, [name], present with pt. [name] provides majority of hx as pt has difficulty  with speech due to Broca's aphasia (working with SLP). Pt reports partial paralysis of R side. She is unable to move her RUE, RLE also affected. Pt ambulates with hemiwalker in their home for short distances and does not use WC at home. She fatigues quickly with ambulation. Pt has AFO for RLE but unable to determine how to donn it without it hurting her LE. Pt currently with wound on R calf covered by large bandage that they have been treating for at least a month (reports physician aware). In addition to wound on R calf, pt reports R side skin irritation under breast and lateral trunk due to bra underwire. They have since removed underwire. Pt was hospitalized 10 days in June due to UTI. She has had at least 12 falls in the last six months. Reports no injuries with falls, and states "just bumps and bruises." Pt reports she has constant head pain. Pt spouse reports pt has pain throughout her whole right side down to LE. Can have bad cramps in RUE and RLE. Reports no numbness/tingling. Occ dizziness, unable to describe further. Pt sleeps in recliner, wakes up stiff in the morning. PMH per chart includes: Anorexia, dehydration, Adult Failure to thrive, contracture of muscles multiple sites. Pt./caregiver reports recently being hospitalized with a UTI, and dehydration. Extensive hx, please refer to chart for full details.   PAIN: from eval Are you having pain? Yes: NPRS scale: 3/10 Pain location: Top of head, front Pain description: States, "cancer pain" head pain Aggravating factors: unsure Relieving factors: medications  PRECAUTIONS: Fall  WEIGHT BEARING RESTRICTIONS No  FALLS: Has patient fallen in last 6 months? Yes. Number of falls 12  LIVING ENVIRONMENT: information primarily from chart Lives with: lives with their spouse Lives in:  House/apartment Stairs: Yes: External: 4 steps; bilateral but cannot reach both Has following equipment at home: Single point cane, Walker - 4 wheeled, Hemi walker, shower chair, bed side commode, Grab bars, and lift chair  PLOF: Independent  PATIENT GOALS  "Normal function" as much as possible. Improving her balance.   TODAY'S TREATMENT:  07/28/22  Therapeutic Activity:   Goal retesting completed on this date. PT instructs pt in goals throughout/indications of performance on progress and plan. Please refer to goal section below for full details.   PREVIOUS TherEx:   STS: multiple reps with UUE assist with min-mod assist throughout. Instruction in technique: anterior lean, LE positioning, UE push/reach   PATIENT EDUCATION: Education details: Pt educated throughout session about proper posture and technique with exercises. Improved exercise technique, movement at target joints, use of target muscles after min to mod verbal, visual, tactile cues. Person educated: pt and her spouse Education method:Explanation, Demonstration, Tactile cues, and Verbal cues, Education comprehension: verbalized understanding, returned demonstration, tactile cues required, and needs further education   HOME EXERCISE PROGRAM:  pt to continue HEP as given  Access Code: [email or web address] URL: [email or web address] Date: 11/18/2021 Prepared by: Temple Pacini  Exercises - Elbow Extension with Anchored Resistance  - 1 x daily - 5 x weekly - 3 sets - 10 reps  Access Code: [email or web address] URL: [email or web address] Date: 10/02/2021 Prepared by: Maureen Ralphs  Exercises - Seated Hip Flexion March with Ankle Weights  - 1 x daily - 7 x weekly - 3 sets - 10 reps - Seated Long Arc Quad  - 1 x daily - 7 x weekly - 3 sets - 10 reps - Supine Quad Set  - 1 x daily - 7 x weekly - 3 sets -  10 reps - Proper Sit to Stand Technique  - 1 x daily - 7 x weekly - 3 sets - 10 reps  GOALS: Goals  reviewed with patient? Yes  SHORT TERM GOALS: Target date: 07/21/2022   Patient will be independent in home exercise program to improve strength/mobility for better functional independence with ADLs. Baseline: to be initiated next 1-2 sessions; 10/17 previously issued, pt is 11/16: to be advanced as pt has gained strength since previous assessment ; 06/09/2022: Pt reports performing her HEP every once in a while; 07/28/22:  improved activity, but not consistent with HEP  Goal status: ONGOING   LONG TERM GOALS: Target date: 03/12/2022   Patient will demo ability to perform at least 1 STS by pushing from [facility] with LUE instead of pulling to stand to improve functional mobility Baseline: Goal changed as pt FOTO tracked by OT, with new goal: pt currently pulls to stand; 11/16: deferred due to pt feeling unwell; 11/22: able to complete with mod assist; 03/03/2022: unable to complete STS with push to stand technique; 06/09/2022 able to complete with mod assist 1x  Goal status: MET   2.   Patient (< 61 years old) will be able to perform and complete five times sit to stand test from standard chair height in <30 seconds to indicate increased LE strength and improved balance. Baseline: Pt able to complete only 1 stand, insufficient strength currently to complete full test; 10/17 31 seconds pull to stand with UE, close CGA-min a 11/16: deferred due to pt feeling unwell; 03/03/2022: 30 sec pull to stand technique used; 06/09/2022: 24 seconds pull to stand  Goal status:  MET  3.  Pt will improve LLE strength by at least 1/2 point on MMT in order to increase ease with transfers and functional mobility. Baseline: LLE grossly 4/5 throughout; 10/17: still generally 4/5 B, however now exhibits AROM with R hip flexion and knee ext; 11/16: 4+/5 LLE, RLE 3-/5, noted improvement however in quads and hip flexors; 06/09/2022: LLE strength grossly 4+/5  Goal status: MET  4.  Patient will increase 10 meter walk test to at least 0.80  m/s as to improve gait speed for better community ambulation and to reduce fall risk. Baseline: to be tested next 1-2 visits; 10/17: pt unable to perform 11/16: deferred due to pt feeling unwell; 11/21: 0.17 m/s; 03/03/22: 0.13 m/s with HW; 06/09/2022 0.185 m/s with HW; 07/28/22: 0.13 m/s with HW and WC follow  Goal status: IN PROGRESS  5.  Pt will complete STS from standard height chair with UUE assist off armrests and with no greater than min assist to improve ease and safety with transfers. Baseline: mod a x2; 10/17: pulling to stand with LUE CGA 11/16: deferred due to pt feeling unwell; 11/21: currently requires mod a to complete; 03/03/2022: pt attempted indep today but unable to complete; 06/09/2022 able to complete with mod assist 1 07/28/22: able to complete with min assist  Goal status: MET,   6.  Pt will improve RLE strength by at least 1/2 point on MMT in order to increase ease with transfers and functional mobility. Baseline: 11/16: 4+/5 LLE, RLE 3-/5, noted improvement however in quads and hip flexors on R; 03/03/2022: 3-/5 RLE, noted increased tone throughout RLE musculature; 06/09/2022: strength still grossly 3-/5 RLE, however R quad strength has improved to 4-/5 ; 07/28/22: strength grossly 3-/5, R quad 4/5  (likely largely due to spasticity)  Goal status: IN PROGRESS   ASSESSMENT:  CLINICAL IMPRESSION:  Goals reassessed for progress note. Pt with mixed performance: she met STS goal, indicating increased BLE strength/power and also shows modest improvement in R quad strength although this is likely due to spasticity. While pt made some gains, pt with decreased performance/gait speed on . The pt would benefit from continued skilled PT to address immobility, LE muscle weakness, and pain in order to decrease fall risk and increase functional mobility and QOL.   OBJECTIVE IMPAIRMENTS Abnormal gait, decreased activity tolerance, decreased balance, decreased coordination, decreased endurance,  decreased mobility, difficulty walking, decreased ROM, decreased strength, hypomobility, increased edema, increased fascial restrictions, increased muscle spasms, impaired flexibility, impaired sensation, impaired tone, impaired UE functional use, improper body mechanics, postural dysfunction, and pain.   ACTIVITY LIMITATIONS carrying, lifting, bending, sitting, standing, squatting, stairs, transfers, bed mobility, bathing, toileting, dressing, reach over head, hygiene/grooming, locomotion level, and caring for others  PARTICIPATION LIMITATIONS: meal prep, cleaning, laundry, medication management, personal finances, driving, shopping, community activity, occupation, and yard work  PERSONAL [place], Past/current experiences, Sex, and 3+ comorbidities: Anorexia, dehydration, Adult Failure to thrive, contracture of muscles multiple sites, multiple falls, UTI, extensive PMH refer to chart  are also affecting patient's functional outcome.   REHAB POTENTIAL: Fair    CLINICAL DECISION MAKING: Evolving/moderate complexity  EVALUATION COMPLEXITY: High  PLAN: PT FREQUENCY: 2x/week  PT DURATION: 12 weeks  PLANNED INTERVENTIONS: Therapeutic exercises, Therapeutic activity, Neuromuscular re-education, Balance training, Gait training, Patient/Family education, Self Care, Joint mobilization, Joint manipulation, Stair training, Vestibular training, Canalith repositioning, Orthotic/Fit training, DME instructions, Wheelchair mobility training, Spinal mobilization, Cryotherapy, Moist heat, scar mobilization, Splintting, Taping, Manual therapy, and Re-evaluation  PLAN FOR NEXT SESSION:  stretching, strengthening, gait, balance, manual, standing balance, RLE stretching 5:35 PM, 07/28/22   Baird Kay, PT 07/28/2022, 5:35 PM

## 2022-08-04 ENCOUNTER — Ambulatory Visit: Payer: Medicare Other

## 2022-08-11 ENCOUNTER — Ambulatory Visit: Payer: Medicare Other | Attending: Adult Health Nurse Practitioner

## 2022-08-11 DIAGNOSIS — R278 Other lack of coordination: Secondary | ICD-10-CM | POA: Insufficient documentation

## 2022-08-11 DIAGNOSIS — M6281 Muscle weakness (generalized): Secondary | ICD-10-CM | POA: Diagnosis present

## 2022-08-11 DIAGNOSIS — R262 Difficulty in walking, not elsewhere classified: Secondary | ICD-10-CM | POA: Diagnosis present

## 2022-08-11 DIAGNOSIS — M25661 Stiffness of right knee, not elsewhere classified: Secondary | ICD-10-CM | POA: Insufficient documentation

## 2022-08-11 DIAGNOSIS — R2681 Unsteadiness on feet: Secondary | ICD-10-CM | POA: Insufficient documentation

## 2022-08-11 NOTE — Therapy (Signed)
OUTPATIENT PHYSICAL THERAPY NEURO TREATMENT      Patient Name: Kelly Rollins MRN: [id number] DOB:[birth year], 52 y.o., female Today's Date: 08/11/2022   PCP: Wilford Corner, PA-C REFERRING PROVIDER: Etta Quill, Anette Riedel, PA-C   PT End of Session - 08/11/22 1708     Visit Number 31    Number of Visits 50    Date for PT Re-Evaluation 09/01/22   corrected/addended for recert   Authorization Type [employer]    Authorization Time Period 09/16/21-12/09/21    PT Start Time 1405    PT Stop Time 1445    PT Time Calculation (min) 40 min    Equipment Utilized During Treatment Gait belt    Activity Tolerance Patient limited by fatigue;Patient tolerated treatment well    Behavior During Therapy [facility] for tasks assessed/performed                           Past Medical History:  Diagnosis Date   ABDOMINAL PAIN OTHER SPECIFIED SITE 02/21/2009   Qualifier: Diagnosis of  By: Dayton Martes MD, Talia     ABDOMINAL PAIN-LUQ 04/17/2009   Qualifier: Diagnosis of  By: Russella Dar MD Bronson Curb T    Anal fissure    Anginal pain (HCC)    Asthma    Depression    Diabetes mellitus without complication (HCC)    Dyspnea    Dysrhythmia    Headache(784.0)    HEMATOCHEZIA 04/17/2009   Qualifier: Diagnosis of  By: Russella Dar MD Bronson Curb T    Hyperlipidemia    Hypertension    IBS (irritable bowel syndrome)    Migraine headache 11/06/2008   Qualifier: Diagnosis of  By: Dayton Martes MD, Talia     OVARIAN CYST, RIGHT 02/21/2009   Qualifier: Diagnosis of  By: Dayton Martes MD, Talia     PALPITATIONS, OCCASIONAL 04/17/2010   Qualifier: Diagnosis of  By: Dayton Martes MD, Talia     Pancreatitis 10/14/2017   Past Surgical History:  Procedure Laterality Date   BREAST EXCISIONAL BIOPSY Left 1997   ? left side, done at time of reduction , benign   BREAST REDUCTION SURGERY     bilateral   CESAREAN SECTION     CHOLECYSTECTOMY     COLONOSCOPY     ESOPHAGOGASTRODUODENOSCOPY     ESOPHAGOGASTRODUODENOSCOPY  (EGD) WITH PROPOFOL N/A 10/21/2020   Procedure: ESOPHAGOGASTRODUODENOSCOPY (EGD) WITH PROPOFOL;  Surgeon: Regis Bill, MD;  Location: [facility] ENDOSCOPY;  Service: Endoscopy;  Laterality: N/A;   REDUCTION MAMMAPLASTY Bilateral 1997   VAGINAL HYSTERECTOMY     Patient Active Problem List   Diagnosis Date Noted   Abnormal MRI, lumbar spine (01/29/2020) 02/19/2020   Anxiety due to MRI 01/25/2020   Claustrophobia associated to MRI 01/25/2020   Enthesopathy of knee region (Right) 01/25/2020   Enthesopathy of knee region (Left) 01/25/2020   Lumbar facet hypertrophy 01/25/2020   Lumbosacral radiculopathy at S1 (Left) 01/17/2020   Lumbosacral radiculopathy (S1) (Left) 01/17/2020   Chronic lower extremity pain (Bilateral) (L>R) 01/17/2020   History of pancreatitis 07/18/2019   Obstructive sleep apnea syndrome 01/02/2019   Lumbar facet arthropathy (Multilevel) (Bilateral) 12/14/2018   Osteoarthritis involving multiple joints 11/28/2018   Chronic hip pain (Left) 11/16/2018   Acute hip pain (Left) 11/16/2018   DDD (degenerative disc disease), lumbosacral 11/16/2018   Chronic hip pain (Secondary source of pain) (Bilateral) (L>R) 05/19/2017   Elevated C-reactive protein (CRP) 05/19/2017   Elevated sed rate 05/19/2017   Spondylosis without myelopathy  or radiculopathy, lumbosacral region 05/19/2017   Pharmacologic therapy 05/06/2017   Disorder of skeletal system 05/06/2017   Problems influencing health status 05/06/2017   Chest pain with moderate risk of acute coronary syndrome 04/21/2017   DOE (dyspnea on exertion) 04/21/2017   Intermittent palpitations 04/21/2017   Vitamin D insufficiency 05/04/2016   Depression 02/04/2016   Fatty liver 02/04/2016   Hypertension associated with diabetes (HCC) 02/04/2016   Chronic pain syndrome 01/15/2016   Acute low back pain 05/28/2015   Lumbar transverse process fracture (HCC) (Left L1) (03/11/15) 05/28/2015   Long term current use of opiate analgesic  05/01/2015   Long term prescription opiate use 05/01/2015   Opiate use (25 MME/Day) 05/01/2015   Encounter for therapeutic drug level monitoring 05/01/2015   Encounter for pain management planning 05/01/2015   Chronic low back pain (Primary Source of Pain) (Bilateral) (L>R) 05/01/2015   Lumbar facet syndrome (Bilateral) (L>R) 05/01/2015   Chronic knee pain [facility] source of pain) (Bilateral) (L>R) 05/01/2015   Lumbar spondylosis 05/01/2015   Neuropathic pain 05/01/2015   Neurogenic pain 05/01/2015   Myofascial pain 05/01/2015   Essential hypertriglyceridemia 04/26/2014   Type 2 diabetes mellitus with hyperglycemia, with long-term current use of insulin (HCC) 04/26/2014   Anxiety 05/15/2010   Obesity 05/15/2010   Hyperlipidemia 04/03/2010   Fatty liver disease 04/02/2010   Pure hypercholesterolemia 03/19/2010   GERD 04/17/2009   Constipation 04/17/2009   TOBACCO ABUSE 11/06/2008    ONSET DATE: January 2023  REFERRING DIAG: C71.9 (ICD-10-CM) - Malignant neoplasm of brain, unspecified  THERAPY DIAG:  Muscle weakness (generalized)  Difficulty in walking, not elsewhere classified  Other lack of coordination  Unsteadiness on feet  Rationale for Evaluation and Treatment Rehabilitation  SUBJECTIVE:                                                                                                                                                                                              SUBJECTIVE STATEMENT: Pt spouse reports reports appealing WC decision since it was denied. Pt started new medication to help with energy levels. It has helped but reports makes her feel a bit loopy.  Pt accompanied by:  [name]  PAIN:  Are you having pain? 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 Pt's husband, [name], present with pt. [name] provides majority of hx as pt has difficulty with speech due to Broca's aphasia (working with SLP). Pt reports partial  paralysis of R side. She is unable to move her RUE, RLE also affected. Pt ambulates with hemiwalker in their home for short distances and does not use WC at home. She fatigues quickly with ambulation. Pt has AFO for RLE but unable to determine how to donn it without it hurting her LE. Pt currently with wound on R calf covered by large bandage that they have been treating for at least a month (reports physician aware). In addition to wound on R calf, pt reports R side skin irritation under breast and lateral trunk due to bra underwire. They have since removed underwire. Pt was hospitalized 10 days in June due to UTI. She has had at least 12 falls in the last six months. Reports no injuries with falls, and states "just bumps and bruises." Pt reports she has constant head pain. Pt spouse reports pt has pain throughout her whole right side down to LE. Can have bad cramps in RUE and RLE. Reports no numbness/tingling. Occ dizziness, unable to describe further. Pt sleeps in recliner, wakes up stiff in the morning. PMH per chart includes: Anorexia, dehydration, Adult Failure to thrive, contracture of muscles multiple sites. Pt./caregiver reports recently being hospitalized with a UTI, and dehydration. Extensive hx, please refer to chart for full details.   PAIN: from eval Are you having pain? Yes: NPRS scale: 3/10 Pain location: Top of head, front Pain description: States, "cancer pain" head pain Aggravating factors: unsure Relieving factors: medications  PRECAUTIONS: Fall  WEIGHT BEARING RESTRICTIONS No  FALLS: Has patient fallen in last 6 months? Yes. Number of falls 12  LIVING ENVIRONMENT: information primarily from chart Lives with: lives with their spouse Lives in: House/apartment Stairs: Yes: External: 4 steps; bilateral but  cannot reach both Has following equipment at home: Single point cane, Walker - 4 wheeled, Hemi walker, shower chair, bed side commode, Grab bars, and lift chair  PLOF: Independent  PATIENT GOALS  "Normal function" as much as possible. Improving her balance.   TODAY'S TREATMENT:  08/11/22   TherEx:  Gait belt donned throughout: Ambulation for endurance with HW and WC follow, CGA 1x148 ft. Pt rates medium  STS: multiple reps with UUE assist with -mod assist. Instruction in technique: anterior lean, LE positioning, UE push/reach --STS from airex pad on seat 5x, Pt continues to provide mod assist   Ambulation for endurance with HW, WC follow, CGA 1x 36 ft  NMR:  PT providing CGA Standing with trunk twists 10x each way Standing EC x30 sec Standing with LUE reaches: up, down, forward    PATIENT EDUCATION: Education details: Pt educated throughout session about proper posture and technique with exercises. Improved exercise technique, movement at target joints, use of target muscles after min to mod verbal, visual, tactile cues. Person educated: pt and her spouse Education method:Explanation, Demonstration, Tactile cues, and Verbal cues, Education comprehension: verbalized understanding, returned demonstration, tactile cues required, and needs further education   HOME EXERCISE PROGRAM:  pt to continue HEP as given  Access Code: [email or web address] URL: [email or web address] Date: 11/18/2021 Prepared by: Temple Pacini  Exercises - Elbow Extension with Anchored Resistance  - 1 x daily - 5 x weekly - 3 sets - 10 reps  Access Code: [email or web address] URL: [email or web address] Date: 10/02/2021 Prepared by: Maureen Ralphs  Exercises - Seated Hip Flexion March with Ankle Weights  - 1 x daily - 7 x weekly - 3 sets - 10 reps - Seated Long Arc Quad  - 1 x daily - 7 x weekly -  3 sets - 10 reps - Supine Quad Set  - 1 x daily - 7 x weekly - 3 sets - 10 reps - Proper Sit to  Stand Technique  - 1 x daily - 7 x weekly - 3 sets - 10 reps  GOALS: Goals reviewed with patient? Yes  SHORT TERM GOALS: Target date: 07/21/2022   Patient will be independent in home exercise program to improve strength/mobility for better functional independence with ADLs. Baseline: to be initiated next 1-2 sessions; 10/17 previously issued, pt is 11/16: to be advanced as pt has gained strength since previous assessment ; 06/09/2022: Pt reports performing her HEP every once in a while; 07/28/22:  improved activity, but not consistent with HEP  Goal status: ONGOING   LONG TERM GOALS: Target date: 03/12/2022   Patient will demo ability to perform at least 1 STS by pushing from [facility] with LUE instead of pulling to stand to improve functional mobility Baseline: Goal changed as pt FOTO tracked by OT, with new goal: pt currently pulls to stand; 11/16: deferred due to pt feeling unwell; 11/22: able to complete with mod assist; 03/03/2022: unable to complete STS with push to stand technique; 06/09/2022 able to complete with mod assist 1x  Goal status: MET   2.   Patient (< 93 years old) will be able to perform and complete five times sit to stand test from standard chair height in <30 seconds to indicate increased LE strength and improved balance. Baseline: Pt able to complete only 1 stand, insufficient strength currently to complete full test; 10/17 31 seconds pull to stand with UE, close CGA-min a 11/16: deferred due to pt feeling unwell; 03/03/2022: 30 sec pull to stand technique used; 06/09/2022: 24 seconds pull to stand  Goal status:  MET  3.  Pt will improve LLE strength by at least 1/2 point on MMT in order to increase ease with transfers and functional mobility. Baseline: LLE grossly 4/5 throughout; 10/17: still generally 4/5 B, however now exhibits AROM with R hip flexion and knee ext; 11/16: 4+/5 LLE, RLE 3-/5, noted improvement however in quads and hip flexors; 06/09/2022: LLE strength grossly 4+/5   Goal status: MET  4.  Patient will increase 10 meter walk test to at least 0.80 m/s as to improve gait speed for better community ambulation and to reduce fall risk. Baseline: to be tested next 1-2 visits; 10/17: pt unable to perform 11/16: deferred due to pt feeling unwell; 11/21: 0.17 m/s; 03/03/22: 0.13 m/s with HW; 06/09/2022 0.185 m/s with HW; 07/28/22: 0.13 m/s with HW and WC follow  Goal status: IN PROGRESS  5.  Pt will complete STS from standard height chair with UUE assist off armrests and with no greater than min assist to improve ease and safety with transfers. Baseline: mod a x2; 10/17: pulling to stand with LUE CGA 11/16: deferred due to pt feeling unwell; 11/21: currently requires mod a to complete; 03/03/2022: pt attempted indep today but unable to complete; 06/09/2022 able to complete with mod assist 1 07/28/22: able to complete with min assist  Goal status: MET,   6.  Pt will improve RLE strength by at least 1/2 point on MMT in order to increase ease with transfers and functional mobility. Baseline: 11/16: 4+/5 LLE, RLE 3-/5, noted improvement however in quads and hip flexors on R; 03/03/2022: 3-/5 RLE, noted increased tone throughout RLE musculature; 06/09/2022: strength still grossly 3-/5 RLE, however R quad strength has improved to 4-/5 ; 07/28/22: strength  grossly 3-/5, R quad 4/5  (likely largely due to spasticity)  Goal status: IN PROGRESS   ASSESSMENT:  CLINICAL IMPRESSION: Pt with slightly more difficulty completing STS today, requiring up to mod assist. Pt observed to have and reports increased RLE stiffness. This is currently affecting her ability to transfer. While pt with some difficulty today, she was highly motivated throughout, with improved activity tolerance. The pt would benefit from continued skilled PT to address immobility, LE muscle weakness, and pain in order to decrease fall risk and increase functional mobility and QOL.   OBJECTIVE IMPAIRMENTS Abnormal gait,  decreased activity tolerance, decreased balance, decreased coordination, decreased endurance, decreased mobility, difficulty walking, decreased ROM, decreased strength, hypomobility, increased edema, increased fascial restrictions, increased muscle spasms, impaired flexibility, impaired sensation, impaired tone, impaired UE functional use, improper body mechanics, postural dysfunction, and pain.   ACTIVITY LIMITATIONS carrying, lifting, bending, sitting, standing, squatting, stairs, transfers, bed mobility, bathing, toileting, dressing, reach over head, hygiene/grooming, locomotion level, and caring for others  PARTICIPATION LIMITATIONS: meal prep, cleaning, laundry, medication management, personal finances, driving, shopping, community activity, occupation, and yard work  PERSONAL [place], Past/current experiences, Sex, and 3+ comorbidities: Anorexia, dehydration, Adult Failure to thrive, contracture of muscles multiple sites, multiple falls, UTI, extensive PMH refer to chart  are also affecting patient's functional outcome.   REHAB POTENTIAL: Fair    CLINICAL DECISION MAKING: Evolving/moderate complexity  EVALUATION COMPLEXITY: High  PLAN: PT FREQUENCY: 2x/week  PT DURATION: 12 weeks  PLANNED INTERVENTIONS: Therapeutic exercises, Therapeutic activity, Neuromuscular re-education, Balance training, Gait training, Patient/Family education, Self Care, Joint mobilization, Joint manipulation, Stair training, Vestibular training, Canalith repositioning, Orthotic/Fit training, DME instructions, Wheelchair mobility training, Spinal mobilization, Cryotherapy, Moist heat, scar mobilization, Splintting, Taping, Manual therapy, and Re-evaluation  PLAN FOR NEXT SESSION:  stretching, strengthening, gait, balance, manual, standing balance, RLE stretching 5:13 PM, 08/11/22   Baird Kay, PT 08/11/2022, 5:13 PM

## 2022-08-18 ENCOUNTER — Ambulatory Visit: Payer: Medicare Other

## 2022-08-25 ENCOUNTER — Ambulatory Visit: Payer: Medicare Other

## 2022-08-25 DIAGNOSIS — M25661 Stiffness of right knee, not elsewhere classified: Secondary | ICD-10-CM

## 2022-08-25 DIAGNOSIS — R262 Difficulty in walking, not elsewhere classified: Secondary | ICD-10-CM

## 2022-08-25 DIAGNOSIS — M6281 Muscle weakness (generalized): Secondary | ICD-10-CM

## 2022-08-25 DIAGNOSIS — R278 Other lack of coordination: Secondary | ICD-10-CM

## 2022-08-25 DIAGNOSIS — R2681 Unsteadiness on feet: Secondary | ICD-10-CM

## 2022-08-25 NOTE — Therapy (Signed)
OUTPATIENT PHYSICAL THERAPY NEURO TREATMENT      Patient Name: Kelly Rollins MRN: [id number] DOB:05-20-1970, 52 y.o., female Today's Date: 08/25/2022   PCP: Wilford Corner, PA-C REFERRING PROVIDER: Etta Quill, Anette Riedel, PA-C   PT End of Session - 08/25/22 1901     Visit Number 32    Number of Visits 50    Date for PT Re-Evaluation 09/01/22   corrected/addended for recert   Authorization Type [employer]    Authorization Time Period 09/16/21-12/09/21    PT Start Time 1402    PT Stop Time 1444    PT Time Calculation (min) 42 min    Equipment Utilized During Treatment Gait belt    Activity Tolerance Patient tolerated treatment well    Behavior During Therapy WFL for tasks assessed/performed                            Past Medical History:  Diagnosis Date   ABDOMINAL PAIN OTHER SPECIFIED SITE 02/21/2009   Qualifier: Diagnosis of  By: Dayton Martes MD, Talia     ABDOMINAL PAIN-LUQ 04/17/2009   Qualifier: Diagnosis of  By: Russella Dar MD Bronson Curb T    Anal fissure    Anginal pain (HCC)    Asthma    Depression    Diabetes mellitus without complication (HCC)    Dyspnea    Dysrhythmia    Headache(784.0)    HEMATOCHEZIA 04/17/2009   Qualifier: Diagnosis of  By: Russella Dar MD Bronson Curb T    Hyperlipidemia    Hypertension    IBS (irritable bowel syndrome)    Migraine headache 11/06/2008   Qualifier: Diagnosis of  By: Dayton Martes MD, Talia     OVARIAN CYST, RIGHT 02/21/2009   Qualifier: Diagnosis of  By: Dayton Martes MD, Talia     PALPITATIONS, OCCASIONAL 04/17/2010   Qualifier: Diagnosis of  By: Dayton Martes MD, Talia     Pancreatitis 10/14/2017   Past Surgical History:  Procedure Laterality Date   BREAST EXCISIONAL BIOPSY Left 1997   ? left side, done at time of reduction , benign   BREAST REDUCTION SURGERY     bilateral   CESAREAN SECTION     CHOLECYSTECTOMY     COLONOSCOPY     ESOPHAGOGASTRODUODENOSCOPY     ESOPHAGOGASTRODUODENOSCOPY (EGD) WITH PROPOFOL N/A  10/21/2020   Procedure: ESOPHAGOGASTRODUODENOSCOPY (EGD) WITH PROPOFOL;  Surgeon: Regis Bill, MD;  Location: [facility] ENDOSCOPY;  Service: Endoscopy;  Laterality: N/A;   REDUCTION MAMMAPLASTY Bilateral 1997   VAGINAL HYSTERECTOMY     Patient Active Problem List   Diagnosis Date Noted   Abnormal MRI, lumbar spine (01/29/2020) 02/19/2020   Anxiety due to MRI 01/25/2020   Claustrophobia associated to MRI 01/25/2020   Enthesopathy of knee region (Right) 01/25/2020   Enthesopathy of knee region (Left) 01/25/2020   Lumbar facet hypertrophy 01/25/2020   Lumbosacral radiculopathy at S1 (Left) 01/17/2020   Lumbosacral radiculopathy (S1) (Left) 01/17/2020   Chronic lower extremity pain (Bilateral) (L>R) 01/17/2020   History of pancreatitis 07/18/2019   Obstructive sleep apnea syndrome 01/02/2019   Lumbar facet arthropathy (Multilevel) (Bilateral) 12/14/2018   Osteoarthritis involving multiple joints 11/28/2018   Chronic hip pain (Left) 11/16/2018   Acute hip pain (Left) 11/16/2018   DDD (degenerative disc disease), lumbosacral 11/16/2018   Chronic hip pain (Secondary source of pain) (Bilateral) (L>R) 05/19/2017   Elevated C-reactive protein (CRP) 05/19/2017   Elevated sed rate 05/19/2017   Spondylosis without myelopathy or radiculopathy,  lumbosacral region 05/19/2017   Pharmacologic therapy 05/06/2017   Disorder of skeletal system 05/06/2017   Problems influencing health status 05/06/2017   Chest pain with moderate risk of acute coronary syndrome 04/21/2017   DOE (dyspnea on exertion) 04/21/2017   Intermittent palpitations 04/21/2017   Vitamin D insufficiency 05/04/2016   Depression 02/04/2016   Fatty liver 02/04/2016   Hypertension associated with diabetes (HCC) 02/04/2016   Chronic pain syndrome 01/15/2016   Acute low back pain 05/28/2015   Lumbar transverse process fracture (HCC) (Left L1) (03/11/15) 05/28/2015   Long term current use of opiate analgesic 05/01/2015   Long term  prescription opiate use 05/01/2015   Opiate use (25 MME/Day) 05/01/2015   Encounter for therapeutic drug level monitoring 05/01/2015   Encounter for pain management planning 05/01/2015   Chronic low back pain (Primary Source of Pain) (Bilateral) (L>R) 05/01/2015   Lumbar facet syndrome (Bilateral) (L>R) 05/01/2015   Chronic knee pain [facility] source of pain) (Bilateral) (L>R) 05/01/2015   Lumbar spondylosis 05/01/2015   Neuropathic pain 05/01/2015   Neurogenic pain 05/01/2015   Myofascial pain 05/01/2015   Essential hypertriglyceridemia 04/26/2014   Type 2 diabetes mellitus with hyperglycemia, with long-term current use of insulin (HCC) 04/26/2014   Anxiety 05/15/2010   Obesity 05/15/2010   Hyperlipidemia 04/03/2010   Fatty liver disease 04/02/2010   Pure hypercholesterolemia 03/19/2010   GERD 04/17/2009   Constipation 04/17/2009   TOBACCO ABUSE 11/06/2008    ONSET DATE: January 2023  REFERRING DIAG: C71.9 (ICD-10-CM) - Malignant neoplasm of brain, unspecified  THERAPY DIAG:  Muscle weakness (generalized)  Other lack of coordination  Unsteadiness on feet  Difficulty in walking, not elsewhere classified  Stiffness of right knee, not elsewhere classified  Rationale for Evaluation and Treatment Rehabilitation  SUBJECTIVE:                                                                                                                                                                                              SUBJECTIVE STATEMENT: Pt reports she missed last visit due to fall. She was sore initially after fall last week. No other falls reported. Pt with no other updates.  Pt accompanied by:  Kelly Rollins  PAIN:  Are you having pain? 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 Pt's husband, Kelly Rollins, present with pt. Kelly Rollins provides majority of hx as pt has difficulty with speech due to Broca's aphasia (working with SLP). Pt reports  partial paralysis of R side. She is unable to move her RUE, RLE also affected. Pt ambulates with hemiwalker in their home for short distances and does not use WC at home. She fatigues quickly with ambulation. Pt has AFO for RLE but unable to determine how to donn it without it hurting her LE. Pt currently with wound on R calf covered by large bandage that they have been treating for at least a month (reports physician aware). In addition to wound on R calf, pt reports R side skin irritation under breast and lateral trunk due to bra underwire. They have since removed underwire. Pt was hospitalized 10 days in June due to UTI. She has had at least 12 falls in the last six months. Reports no injuries with falls, and states "just bumps and bruises." Pt reports she has constant head pain. Pt spouse reports pt has pain throughout her whole right side down to LE. Can have bad cramps in RUE and RLE. Reports no numbness/tingling. Occ dizziness, unable to describe further. Pt sleeps in recliner, wakes up stiff in the morning. PMH per chart includes: Anorexia, dehydration, Adult Failure to thrive, contracture of muscles multiple sites. Pt./caregiver reports recently being hospitalized with a UTI, and dehydration. Extensive hx, please refer to chart for full details.   PAIN: from eval Are you having pain? Yes: NPRS scale: 3/10 Pain location: Top of head, front Pain description: States, "cancer pain" head pain Aggravating factors: unsure Relieving factors: medications  PRECAUTIONS: Fall  WEIGHT BEARING RESTRICTIONS No  FALLS: Has patient fallen in last 6 months? Yes. Number of falls 12  LIVING ENVIRONMENT: information primarily from chart Lives with: lives with their spouse Lives in: House/apartment Stairs: Yes: External: 4 steps; bilateral but  cannot reach both Has following equipment at home: Single point cane, Walker - 4 wheeled, Hemi walker, shower chair, bed side commode, Grab bars, and lift chair  PLOF: Independent  PATIENT GOALS  "Normal function" as much as possible. Improving her balance.   TODAY'S TREATMENT:  08/25/22   TherEx:  Gait belt donned and CGA provided throughout for pt safety unless specified otherwise:  Ambulation for strength with HW, from WC to nustep approx 6 ft, close CGA. Mod assist to complete STS to initiate ambulation  Nustep endurance/strength training: Lvl 1 x 3 min 30 sec Lvl 2 x 2 min 30 sec Comments: use of LUE/BLE, pt assisting with LE positioning throughout and close CGA-min assist for mount/dismount  Ambulation for strength with HW, from nustep to chair x 6 ft, close CGA.  Mod assist to complete STS to initiate ambulation  In // bars: Ambulation for strength/endurance FWD/BCKWD 2x length of // bars each way. BUE support on bars.  Step tap with LLE onto half-foam 10x - occ assist form PT for LLE place on/off half-foam  STS 2x3, pt alternating between pull-to-stand and push-to stand technique. Min-mod assist provided throughout.  Ambulation for strength with HW, from chair to WC x 14 ft, CGA.   PATIENT EDUCATION: Education details: Pt educated throughout session about proper posture and technique with exercises. Improved exercise technique, movement at target joints, use of target muscles after min to mod verbal, visual, tactile cues. Person educated: pt Education method:Explanation, Demonstration, Tactile cues, and Verbal cues, Education comprehension: verbalized understanding, returned demonstration, tactile cues required, and needs further education   HOME EXERCISE PROGRAM:  pt to continue HEP as given  Access Code: [email or web address] URL: [email or web address] Date: 11/18/2021 Prepared by: Temple Pacini  Exercises - Elbow  Extension with Anchored Resistance  - 1 x  daily - 5 x weekly - 3 sets - 10 reps  Access Code: [email or web address] URL: [email or web address] Date: 10/02/2021 Prepared by: Maureen Ralphs  Exercises - Seated Hip Flexion March with Ankle Weights  - 1 x daily - 7 x weekly - 3 sets - 10 reps - Seated Long Arc Quad  - 1 x daily - 7 x weekly - 3 sets - 10 reps - Supine Quad Set  - 1 x daily - 7 x weekly - 3 sets - 10 reps - Proper Sit to Stand Technique  - 1 x daily - 7 x weekly - 3 sets - 10 reps  GOALS: Goals reviewed with patient? Yes  SHORT TERM GOALS: Target date: 07/21/2022   Patient will be independent in home exercise program to improve strength/mobility for better functional independence with ADLs. Baseline: to be initiated next 1-2 sessions; 10/17 previously issued, pt is 11/16: to be advanced as pt has gained strength since previous assessment ; 06/09/2022: Pt reports performing her HEP every once in a while; 07/28/22:  improved activity, but not consistent with HEP  Goal status: ONGOING   LONG TERM GOALS: Target date: 03/12/2022   Patient will demo ability to perform at least 1 STS by pushing from [facility] with LUE instead of pulling to stand to improve functional mobility Baseline: Goal changed as pt FOTO tracked by OT, with new goal: pt currently pulls to stand; 11/16: deferred due to pt feeling unwell; 11/22: able to complete with mod assist; 03/03/2022: unable to complete STS with push to stand technique; 06/09/2022 able to complete with mod assist 1x  Goal status: MET   2.   Patient (< 67 years old) will be able to perform and complete five times sit to stand test from standard chair height in <30 seconds to indicate increased LE strength and improved balance. Baseline: Pt able to complete only 1 stand, insufficient strength currently to complete full test; 10/17 31 seconds pull to stand with UE, close CGA-min a 11/16: deferred due to pt feeling unwell; 03/03/2022: 30 sec pull to stand technique used; 06/09/2022: 24 seconds  pull to stand  Goal status:  MET  3.  Pt will improve LLE strength by at least 1/2 point on MMT in order to increase ease with transfers and functional mobility. Baseline: LLE grossly 4/5 throughout; 10/17: still generally 4/5 B, however now exhibits AROM with R hip flexion and knee ext; 11/16: 4+/5 LLE, RLE 3-/5, noted improvement however in quads and hip flexors; 06/09/2022: LLE strength grossly 4+/5  Goal status: MET  4.  Patient will increase 10 meter walk test to at least 0.80 m/s as to improve gait speed for better community ambulation and to reduce fall risk. Baseline: to be tested next 1-2 visits; 10/17: pt unable to perform 11/16: deferred due to pt feeling unwell; 11/21: 0.17 m/s; 03/03/22: 0.13 m/s with HW; 06/09/2022 0.185 m/s with HW; 07/28/22: 0.13 m/s with HW and WC follow  Goal status: IN PROGRESS  5.  Pt will complete STS from standard height chair with UUE assist off armrests and with no greater than min assist to improve ease and safety with transfers. Baseline: mod a x2; 10/17: pulling to stand with LUE CGA 11/16: deferred due to pt feeling unwell; 11/21: currently requires mod a to complete; 03/03/2022: pt attempted indep today but unable to complete; 06/09/2022 able to complete with mod assist 1 07/28/22: able to complete with min assist  Goal status: MET,   6.  Pt will improve RLE strength by at least 1/2 point on MMT in order to increase ease with transfers and functional mobility. Baseline: 11/16: 4+/5 LLE, RLE 3-/5, noted improvement however in quads and hip flexors on R; 03/03/2022: 3-/5 RLE, noted increased tone throughout RLE musculature; 06/09/2022: strength still grossly 3-/5 RLE, however R quad strength has improved to 4-/5 ; 07/28/22: strength grossly 3-/5, R quad 4/5  (likely largely due to spasticity)  Goal status: IN PROGRESS   ASSESSMENT:  CLINICAL IMPRESSION: Pt completes multiple therapeutic exercises in today's session requiring minimal rest between interventions and  sets. This likely indicates improved activity tolerance. While pt show progress, she still requires up to mod assist for majority of STS reps. The pt would benefit from continued skilled PT to address immobility, LE muscle weakness, and pain in order to decrease fall risk and increase functional mobility and QOL.   OBJECTIVE IMPAIRMENTS Abnormal gait, decreased activity tolerance, decreased balance, decreased coordination, decreased endurance, decreased mobility, difficulty walking, decreased ROM, decreased strength, hypomobility, increased edema, increased fascial restrictions, increased muscle spasms, impaired flexibility, impaired sensation, impaired tone, impaired UE functional use, improper body mechanics, postural dysfunction, and pain.   ACTIVITY LIMITATIONS carrying, lifting, bending, sitting, standing, squatting, stairs, transfers, bed mobility, bathing, toileting, dressing, reach over head, hygiene/grooming, locomotion level, and caring for others  PARTICIPATION LIMITATIONS: meal prep, cleaning, laundry, medication management, personal finances, driving, shopping, community activity, occupation, and yard work  PERSONAL [place], Past/current experiences, Sex, and 3+ comorbidities: Anorexia, dehydration, Adult Failure to thrive, contracture of muscles multiple sites, multiple falls, UTI, extensive PMH refer to chart  are also affecting patient's functional outcome.   REHAB POTENTIAL: Fair    CLINICAL DECISION MAKING: Evolving/moderate complexity  EVALUATION COMPLEXITY: High  PLAN: PT FREQUENCY: 2x/week  PT DURATION: 12 weeks  PLANNED INTERVENTIONS: Therapeutic exercises, Therapeutic activity, Neuromuscular re-education, Balance training, Gait training, Patient/Family education, Self Care, Joint mobilization, Joint manipulation, Stair training, Vestibular training, Canalith repositioning, Orthotic/Fit training, DME instructions, Wheelchair mobility training, Spinal mobilization,  Cryotherapy, Moist heat, scar mobilization, Splintting, Taping, Manual therapy, and Re-evaluation  PLAN FOR NEXT SESSION:  stretching, strengthening, gait, balance, manual, standing balance, RLE stretching, continue plan 7:10 PM, 08/25/22   Baird Kay, PT 08/25/2022, 7:10 PM

## 2022-08-30 ENCOUNTER — Emergency Department (HOSPITAL_COMMUNITY): Payer: Medicare Other

## 2022-08-30 ENCOUNTER — Emergency Department (HOSPITAL_COMMUNITY)
Admission: EM | Admit: 2022-08-30 | Discharge: 2022-08-30 | Disposition: A | Discharge status: Home / Self Care | Payer: Medicare Other | Attending: Emergency Medicine | Admitting: Emergency Medicine

## 2022-08-30 ENCOUNTER — Other Ambulatory Visit: Payer: Self-pay

## 2022-08-30 ENCOUNTER — Encounter (HOSPITAL_COMMUNITY): Payer: Self-pay | Admitting: Emergency Medicine

## 2022-08-30 DIAGNOSIS — Z794 Long term (current) use of insulin: Secondary | ICD-10-CM | POA: Diagnosis not present

## 2022-08-30 DIAGNOSIS — Y9301 Activity, walking, marching and hiking: Secondary | ICD-10-CM | POA: Diagnosis not present

## 2022-08-30 DIAGNOSIS — R9402 Abnormal brain scan: Secondary | ICD-10-CM | POA: Diagnosis not present

## 2022-08-30 DIAGNOSIS — G8111 Spastic hemiplegia affecting right dominant side: Secondary | ICD-10-CM | POA: Insufficient documentation

## 2022-08-30 DIAGNOSIS — S0990XA Unspecified injury of head, initial encounter: Secondary | ICD-10-CM | POA: Diagnosis not present

## 2022-08-30 DIAGNOSIS — W01198A Fall on same level from slipping, tripping and stumbling with subsequent striking against other object, initial encounter: Secondary | ICD-10-CM | POA: Insufficient documentation

## 2022-08-30 DIAGNOSIS — R9089 Other abnormal findings on diagnostic imaging of central nervous system: Secondary | ICD-10-CM

## 2022-08-30 DIAGNOSIS — S40012A Contusion of left shoulder, initial encounter: Secondary | ICD-10-CM | POA: Insufficient documentation

## 2022-08-30 DIAGNOSIS — M25512 Pain in left shoulder: Secondary | ICD-10-CM | POA: Diagnosis present

## 2022-08-30 MED ORDER — MELOXICAM 15 MG PO TABS: 15.0000 mg | ORAL_TABLET | Freq: Every day | ORAL | 0 refills | Status: AC

## 2022-08-30 MED ORDER — NAPROXEN 250 MG PO TABS
500.0000 mg | ORAL_TABLET | Freq: Once | ORAL | Status: AC
  Administered 2022-08-30: 500 mg via ORAL
  Filled 2022-08-30: qty 2

## 2022-08-30 NOTE — Discharge Instructions (Signed)
Please talk to your family doctor about obtaining an MRI to follow-up on the brain tumor, there may be some swelling around it, they should get you into the office this week for a follow-up and to order an MRI.  Return to the ER for severe or worsening symptoms.  Please see the phone number for Dr. Romeo Apple above, he has a bone doctor here locally and can help take care of your pain in the shoulder.  We have placed you in a sling, there is no signs of broken bones in the shoulder.  I would recommend that you take Mobic once a day in addition to applying ice packs intermittently throughout the day for your pain.  ER for worsening symptoms

## 2022-08-30 NOTE — ED Provider Notes (Signed)
[facility] DEPARTMENT AT [facility] Provider Note   CSN: [id number] Arrival date & time: 08/30/22  1531     History  Chief Complaint  Patient presents with   Kelly Rollins is a 52 y.o. female.   Fall   This patient is a 52 year old female with a history of stage III astrocytoma of the brain, she has right-sided spastic hemiplegia of the right arm and right leg, she is able to ambulate with a cane, today she was trying to walk when she stumbled and fell landing on her left shoulder and striking her head against the ground.  This was on concrete, she denies losing consciousness, she has pain in the left shoulder.  The patient does have some expressive difficulty with her speech which is chronic for her but is able to get through with time.  She is not on any anticoagulation based on my review of the medical record.  She does have fentanyl patches on the left shoulder totaling 125 mcg  She arrives by EMS transport    Home Medications Prior to Admission medications   Medication Sig Start Date End Date Taking? Authorizing Provider  meloxicam (MOBIC) 15 MG tablet Take 1 tablet (15 mg total) by mouth daily for 14 days. 08/30/22 09/13/22 Yes Eber Hong, MD  amLODipine (NORVASC) 5 MG tablet Take 5 mg by mouth daily.    [provider]  ARTIFICIAL TEARS 1 % ophthalmic solution  09/14/18   [provider]  glipiZIDE (GLUCOTROL) 5 MG tablet Take by mouth. 02/07/20 02/06/21  [provider]  HYDROcodone-acetaminophen (NORCO/VICODIN) 5-325 MG tablet  01/02/20   [provider]  ibuprofen (ADVIL) 600 MG tablet Take 600 mg by mouth every 6 (six) hours as needed.    [provider]  metoprolol tartrate (LOPRESSOR) 50 MG tablet Take 25 mg by mouth daily.    [provider]  NOVOLOG MIX 70/30 (70-30) 100 UNIT/ML injection 30 u in am, 25 u in pm 09/14/18   [provider]  omeprazole (PRILOSEC OTC) 20 MG  tablet Take 20 mg by mouth daily.    [provider]  ondansetron (ZOFRAN-ODT) 4 MG disintegrating tablet Take 1 tablet (4 mg total) by mouth every 8 (eight) hours as needed. 02/06/21   Delton Prairie, MD  rosuvastatin (CRESTOR) 10 MG tablet  09/14/18   [provider]  tiZANidine (ZANAFLEX) 4 MG tablet Take 1 tablet (4 mg total) by mouth every 8 (eight) hours as needed for muscle spasms. 01/17/20 04/16/20  Delano Metz, MD  traMADol (ULTRAM) 50 MG tablet Take 1 tablet (50 mg total) by mouth every 12 (twelve) hours as needed for severe pain. Must last 30 days 02/19/20 03/20/20  Delano Metz, MD  venlafaxine XR (EFFEXOR-XR) 37.5 MG 24 hr capsule Take 37.5 mg by mouth daily. 10/04/17   [provider]      Allergies    Atorvastatin, Levofloxacin, Morphine, and Penicillins    Review of Systems   Review of Systems  All other systems reviewed and are negative.   Physical Exam Updated Vital Signs BP (!) 167/92   Pulse 90   Temp 97.9 F (36.6 C)   Resp 16   Ht 1.626 m (5\' 4" )   Wt 81 kg   SpO2 96%   BMI 30.65 kg/m  Physical Exam Vitals and nursing note reviewed.  Constitutional:      General: She is not in acute distress.  Appearance: She is well-developed.  HENT:     Head: Normocephalic and atraumatic.     Comments: No obvious injury to the scalp, no hematomas lacerations or abrasions, no redness    Mouth/Throat:     Pharynx: No oropharyngeal exudate.  Eyes:     General: No scleral icterus.       Right eye: No discharge.        Left eye: No discharge.     Conjunctiva/sclera: Conjunctivae normal.     Pupils: Pupils are equal, round, and reactive to light.  Neck:     Thyroid: No thyromegaly.     Vascular: No JVD.  Cardiovascular:     Rate and Rhythm: Normal rate and regular rhythm.     Heart sounds: Normal heart sounds. No murmur heard.    No friction rub. No gallop.  Pulmonary:     Effort: Pulmonary effort is normal. No respiratory distress.      Breath sounds: Normal breath sounds. No wheezing or rales.  Abdominal:     General: Bowel sounds are normal. There is no distension.     Palpations: Abdomen is soft. There is no mass.     Tenderness: There is no abdominal tenderness.  Musculoskeletal:        General: Tenderness present. Normal range of motion.     Cervical back: Normal range of motion and neck supple.     Comments: Tenderness in the left shoulder, decreased range of motion secondary to tenderness, no obvious deformity, no tenderness over the clavicle.  No tenderness over the ribs, no tenderness over the spine  Lymphadenopathy:     Cervical: No cervical adenopathy.  Skin:    General: Skin is warm and dry.     Findings: No erythema or rash.  Neurological:     Mental Status: She is alert.     Coordination: Coordination normal.     Comments: Right arm and leg with spastic hemiplegia, decreased function on the right, able to straight leg raise on the left, able to grip with the left hand, decreased range of motion of the shoulder secondary to pain, speech is slow and stuttering  Psychiatric:        Behavior: Behavior normal.     ED Results / Procedures / Treatments   Labs (all labs ordered are listed, but only abnormal results are displayed) Labs Reviewed - No data to display  EKG None  Radiology CT Head Wo Contrast  Result Date: 08/30/2022 CLINICAL DATA:  Provided history: Polytrauma, blunt. History of brain cancer. EXAM: CT HEAD WITHOUT CONTRAST TECHNIQUE: Contiguous axial images were obtained from the base of the skull through the vertex without intravenous contrast. RADIATION DOSE REDUCTION: This exam was performed according to the departmental dose-optimization program which includes automated exposure control, adjustment of the mA and/or kV according to patient size and/or use of iterative reconstruction technique. COMPARISON:  Brain MRI 02/06/2021.  Head CT 02/06/2021. 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 Similar to the prior head CT of 02/06/2021, portions of the mass are hyperdense. There are small scattered foci of calcification within portions of the mass, new from the prior exam and possibly related to interval radiation therapy. Hypodensity within the adjacent left frontoparietal white matter, and within the white matter along the frontal horn of the left lateral ventricle, which appears to have progressed. This may reflect edema, infiltrative tumor and/or post-treatment changes. There is no progressive mass effect.  No midline shift. There is no acute intracranial hemorrhage. No demarcated cortical infarct. No extra-axial fluid collection. Vascular: No hyperdense vessel. Skull: No acute calvarial fracture. Burr hole within the left frontoparietal calvarium, presumably from prior biopsy. Sinuses/Orbits: No mass or acute finding within the imaged orbits. Minimal mucosal thickening within the left sphenoid sinus IMPRESSION: 1. No acute posttraumatic intracranial findings. 2. 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 Similar to the prior head CT of 02/06/2021, portions of the mass are hyperdense. Ill-defined hypodensity within the left frontal and parietal white matter (surrounding the hyperdense components of the mass) has progressed from the prior head CT. This may reflect post-treatment changes, edema and/or infiltrative tumor. No progressive mass effect or midline shift. A brain MRI (with and without contrast) may be obtained for further evaluation, when and if clinically appropriate. Electronically Signed   By: Jackey Loge D.O.   On: 08/30/2022 17:22   DG Shoulder Left  Result Date: 08/30/2022 CLINICAL DATA:  Left shoulder pain, fall EXAM: LEFT SHOULDER - 2+ VIEW COMPARISON:  None Available. FINDINGS: There is no evidence of fracture or dislocation. There is no evidence of significant arthropathy or other  focal bone abnormality. Soft tissues are unremarkable. IMPRESSION: Negative. Electronically Signed   By: Duanne Guess D.O.   On: 08/30/2022 16:53    Procedures Procedures    Medications Ordered in ED Medications  naproxen (NAPROSYN) tablet 500 mg (has no administration in time range)    ED Course/ Medical Decision Making/ A&P                             Medical Decision Making Amount and/or Complexity of Data Reviewed Radiology: ordered.  Risk Prescription drug management.    This patient presents to the ED for concern of fall with traumatic injury differential diagnosis includes fracture or dislocation of the left shoulder, potentially head injury though it is likely mild, I do not see any clavicular injuries or other injuries of the arms or legs or trunk    Additional history obtained:  Additional history obtained from medical record External records from outside source obtained and reviewed including oncology notes from [facility]   Imaging Studies ordered:  I ordered imaging studies including shoulder x-ray and CT scan of the brain I independently visualized and interpreted imaging which showed no fractures or dislocations of the left shoulder, the brain does have some hyperdensities around the known existing tumor I agree with the radiologist interpretation   Medicines ordered and prescription drug management:  I ordered medication including person for pain, sling for the arm Reevaluation of the patient after these medicines showed that the patient improved, patient examined after sling placed, neurovascularly intact I have reviewed the patients home medicines and have made adjustments as needed   Problem List / ED Course:  Well-appearing, patient was informed of her abnormal CT scan findings and both she and her significant other are willing to follow-up in the outpatient setting, no signs of fracture, can follow-up with orthopedics, likely has contusion or  possible rotator cuff injury   Social Determinants of Health:  Brain tumor           Final Clinical Impression(s) / ED Diagnoses Final diagnoses:  Contusion of left shoulder, initial encounter  Minor head injury without loss of consciousness, initial encounter  Abnormal CT of brain    Rx / DC Orders ED Discharge Orders  Ordered    meloxicam (MOBIC) 15 MG tablet  Daily        08/30/22 1755              Eber Hong, MD 08/30/22 1755

## 2022-08-30 NOTE — ED Triage Notes (Signed)
Pt fell while walking with cane. Pt c/o left shoulder pain.

## 2022-09-01 ENCOUNTER — Ambulatory Visit: Payer: Medicare Other

## 2022-09-08 ENCOUNTER — Ambulatory Visit: Payer: Medicare Other

## 2022-09-15 ENCOUNTER — Ambulatory Visit: Payer: Medicare Other

## 2022-09-22 ENCOUNTER — Ambulatory Visit: Payer: Medicare Other

## 2022-09-29 ENCOUNTER — Ambulatory Visit: Payer: Medicare Other | Attending: Adult Health Nurse Practitioner

## 2022-09-29 DIAGNOSIS — R278 Other lack of coordination: Secondary | ICD-10-CM | POA: Diagnosis present

## 2022-09-29 DIAGNOSIS — R2681 Unsteadiness on feet: Secondary | ICD-10-CM | POA: Diagnosis present

## 2022-09-29 DIAGNOSIS — M542 Cervicalgia: Secondary | ICD-10-CM | POA: Insufficient documentation

## 2022-09-29 DIAGNOSIS — R262 Difficulty in walking, not elsewhere classified: Secondary | ICD-10-CM | POA: Diagnosis present

## 2022-09-29 DIAGNOSIS — M25661 Stiffness of right knee, not elsewhere classified: Secondary | ICD-10-CM | POA: Insufficient documentation

## 2022-09-29 DIAGNOSIS — M6281 Muscle weakness (generalized): Secondary | ICD-10-CM | POA: Diagnosis present

## 2022-09-29 NOTE — Therapy (Signed)
OUTPATIENT PHYSICAL THERAPY NEURO TREATMENT/RECERTIFICATION      Patient Name: Kelly Rollins MRN: [id number] DOB:1970/10/27, 52 y.o., female Today's Date: 09/30/2022   PCP: Wilford Corner, PA-C REFERRING PROVIDER: Etta Quill, Anette Riedel, PA-C   PT End of Session - 09/29/22 1412     Visit Number [id number]    Number of Visits 50    Date for PT Re-Evaluation 12/22/22   corrected/addended for recert   Authorization Type [employer]    Authorization Time Period 09/16/21-12/09/21    PT Start Time 1402    PT Stop Time 1444    PT Time Calculation (min) 42 min    Equipment Utilized During Treatment Gait belt    Activity Tolerance Patient tolerated treatment well    Behavior During Therapy WFL for tasks assessed/performed                            Past Medical History:  Diagnosis Date   ABDOMINAL PAIN OTHER SPECIFIED SITE 02/21/2009   Qualifier: Diagnosis of  By: Dayton Martes MD, Talia     ABDOMINAL PAIN-LUQ 04/17/2009   Qualifier: Diagnosis of  By: Russella Dar MD Bronson Curb T    Anal fissure    Anginal pain (HCC)    Asthma    Depression    Diabetes mellitus without complication (HCC)    Dyspnea    Dysrhythmia    Headache(784.0)    HEMATOCHEZIA 04/17/2009   Qualifier: Diagnosis of  By: Russella Dar MD Bronson Curb T    Hyperlipidemia    Hypertension    IBS (irritable bowel syndrome)    Migraine headache 11/06/2008   Qualifier: Diagnosis of  By: Dayton Martes MD, Talia     OVARIAN CYST, RIGHT 02/21/2009   Qualifier: Diagnosis of  By: Dayton Martes MD, Talia     PALPITATIONS, OCCASIONAL 04/17/2010   Qualifier: Diagnosis of  By: Dayton Martes MD, Talia     Pancreatitis 10/14/2017   Past Surgical History:  Procedure Laterality Date   BREAST EXCISIONAL BIOPSY Left 1997   ? left side, done at time of reduction , benign   BREAST REDUCTION SURGERY     bilateral   CESAREAN SECTION     CHOLECYSTECTOMY     COLONOSCOPY     ESOPHAGOGASTRODUODENOSCOPY     ESOPHAGOGASTRODUODENOSCOPY (EGD) WITH  PROPOFOL N/A 10/21/2020   Procedure: ESOPHAGOGASTRODUODENOSCOPY (EGD) WITH PROPOFOL;  Surgeon: Regis Bill, MD;  Location: [facility] ENDOSCOPY;  Service: Endoscopy;  Laterality: N/A;   REDUCTION MAMMAPLASTY Bilateral 1997   VAGINAL HYSTERECTOMY     Patient Active Problem List   Diagnosis Date Noted   Abnormal MRI, lumbar spine (01/29/2020) 02/19/2020   Anxiety due to MRI 01/25/2020   Claustrophobia associated to MRI 01/25/2020   Enthesopathy of knee region (Right) 01/25/2020   Enthesopathy of knee region (Left) 01/25/2020   Lumbar facet hypertrophy 01/25/2020   Lumbosacral radiculopathy at S1 (Left) 01/17/2020   Lumbosacral radiculopathy (S1) (Left) 01/17/2020   Chronic lower extremity pain (Bilateral) (L>R) 01/17/2020   History of pancreatitis 07/18/2019   Obstructive sleep apnea syndrome 01/02/2019   Lumbar facet arthropathy (Multilevel) (Bilateral) 12/14/2018   Osteoarthritis involving multiple joints 11/28/2018   Chronic hip pain (Left) 11/16/2018   Acute hip pain (Left) 11/16/2018   DDD (degenerative disc disease), lumbosacral 11/16/2018   Chronic hip pain (Secondary source of pain) (Bilateral) (L>R) 05/19/2017   Elevated C-reactive protein (CRP) 05/19/2017   Elevated sed rate 05/19/2017   Spondylosis without myelopathy or radiculopathy,  lumbosacral region 05/19/2017   Pharmacologic therapy 05/06/2017   Disorder of skeletal system 05/06/2017   Problems influencing health status 05/06/2017   Chest pain with moderate risk of acute coronary syndrome 04/21/2017   DOE (dyspnea on exertion) 04/21/2017   Intermittent palpitations 04/21/2017   Vitamin D insufficiency 05/04/2016   Depression 02/04/2016   Fatty liver 02/04/2016   Hypertension associated with diabetes (HCC) 02/04/2016   Chronic pain syndrome 01/15/2016   Acute low back pain 05/28/2015   Lumbar transverse process fracture (HCC) (Left L1) (03/11/15) 05/28/2015   Long term current use of opiate analgesic 05/01/2015    Long term prescription opiate use 05/01/2015   Opiate use (25 MME/Day) 05/01/2015   Encounter for therapeutic drug level monitoring 05/01/2015   Encounter for pain management planning 05/01/2015   Chronic low back pain (Primary Source of Pain) (Bilateral) (L>R) 05/01/2015   Lumbar facet syndrome (Bilateral) (L>R) 05/01/2015   Chronic knee pain [facility] source of pain) (Bilateral) (L>R) 05/01/2015   Lumbar spondylosis 05/01/2015   Neuropathic pain 05/01/2015   Neurogenic pain 05/01/2015   Myofascial pain 05/01/2015   Essential hypertriglyceridemia 04/26/2014   Type 2 diabetes mellitus with hyperglycemia, with long-term current use of insulin (HCC) 04/26/2014   Anxiety 05/15/2010   Obesity 05/15/2010   Hyperlipidemia 04/03/2010   Fatty liver disease 04/02/2010   Pure hypercholesterolemia 03/19/2010   GERD 04/17/2009   Constipation 04/17/2009   TOBACCO ABUSE 11/06/2008    ONSET DATE: January 2023  REFERRING DIAG: C71.9 (ICD-10-CM) - Malignant neoplasm of brain, unspecified  THERAPY DIAG:  Muscle weakness (generalized)  Other lack of coordination  Unsteadiness on feet  Difficulty in walking, not elsewhere classified  Stiffness of right knee, not elsewhere classified  Cervicalgia  Rationale for Evaluation and Treatment Rehabilitation  SUBJECTIVE:                                                                                                                                                                                              SUBJECTIVE STATEMENT: Pt reports she has been recovering from recent fall with sore left shoulder. Reports moving slower but ready to get back to her PT to improve her overall mobility.   Pt accompanied by:  Kelly Rollins  PAIN:  Are you having pain? 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 Pt's husband, Kelly Rollins, present with pt. Kelly Rollins provides majority of hx as pt has difficulty with speech due to [employer]  aphasia (working with SLP). Pt reports partial paralysis of R side. She is unable to move her RUE, RLE also affected. Pt ambulates with hemiwalker in their home for short distances and does not use WC at home. She fatigues quickly with ambulation. Pt has AFO for RLE but unable to determine how to donn it without it hurting her LE. Pt currently with wound on R calf covered by large bandage that they have been treating for at least a month (reports physician aware). In addition to wound on R calf, pt reports R side skin irritation under breast and lateral trunk due to bra underwire. They have since removed underwire. Pt was hospitalized 10 days in June due to UTI. She has had at least 12 falls in the last six months. Reports no injuries with falls, and states "just bumps and bruises." Pt reports she has constant head pain. Pt spouse reports pt has pain throughout her whole right side down to LE. Can have bad cramps in RUE and RLE. Reports no numbness/tingling. Occ dizziness, unable to describe further. Pt sleeps in recliner, wakes up stiff in the morning. PMH per chart includes: Anorexia, dehydration, Adult Failure to thrive, contracture of muscles multiple sites. Pt./caregiver reports recently being hospitalized with a UTI, and dehydration. Extensive hx, please refer to chart for full details.   PAIN: from eval Are you having pain? Yes: NPRS scale: 3/10 Pain location: Top of head, front Pain description: States, "cancer pain" head pain Aggravating factors: unsure Relieving factors: medications  PRECAUTIONS: Fall  WEIGHT BEARING RESTRICTIONS No  FALLS: Has patient fallen in last 6 months? Yes. Number of falls 12  LIVING ENVIRONMENT: information primarily from chart Lives with: lives with their spouse Lives in:  House/apartment Stairs: Yes: External: 4 steps; bilateral but cannot reach both Has following equipment at home: Single point cane, Walker - 4 wheeled, Hemi walker, shower chair, bed side commode, Grab bars, and lift chair  PLOF: Independent  PATIENT GOALS  "Normal function" as much as possible. Improving her balance.   TODAY'S TREATMENT:  09/29/2022  Physical therapy treatment session today consisted of completing assessment of goals and administration of testing as demonstrated and documented in flow sheet, treatment, and goals section of this note. Addition treatments may be found below.   PATIENT EDUCATION: Education details: Pt educated throughout session about proper posture and technique with exercises. Improved exercise technique, movement at target joints, use of target muscles after min to mod verbal, visual, tactile cues. Person educated: pt Education method:Explanation, Demonstration, Tactile cues, and Verbal cues, Education comprehension: verbalized understanding, returned demonstration, tactile cues required, and needs further education   HOME EXERCISE PROGRAM:  pt to continue HEP as given  Access Code: [email or web address] URL: [email or web address] Date: 11/18/2021 Prepared by: Temple Pacini  Exercises - Elbow Extension with Anchored Resistance  - 1 x daily - 5 x weekly - 3 sets - 10 reps  Access Code: [email or web address] URL: [email or web address] Date: 10/02/2021 Prepared by: Maureen Ralphs  Exercises - Seated Hip Flexion March with Ankle Weights  - 1 x daily - 7 x weekly - 3 sets - 10 reps - Seated Long Arc Quad  - 1 x daily - 7 x weekly - 3 sets - 10 reps - Supine Quad Set  - 1 x daily - 7 x weekly - 3 sets - 10 reps - Proper Sit to Stand Technique  - 1 x daily - 7 x weekly - 3 sets - 10 reps  GOALS: Goals reviewed with patient?  Yes  SHORT TERM GOALS: Target date: 11/10/2022   Patient will be independent in home exercise program to improve  strength/mobility for better functional independence with ADLs. Baseline: to be initiated next 1-2 sessions; 10/17 previously issued, pt is 11/16: to be advanced as pt has gained strength since previous assessment ; 06/09/2022: Pt reports performing her HEP every once in a while; 07/28/22:  improved activity, but not consistent with HEP;8/27/204- Patient reports has not been able to do much exercise due to recent fall with increased overall pain Goal status: ONGOING   LONG TERM GOALS: Target date: 12/22/2022   Patient will demo ability to perform at least 1 STS by pushing from [facility] with LUE instead of pulling to stand to improve functional mobility Baseline: Goal changed as pt FOTO tracked by OT, with new goal: pt currently pulls to stand; 11/16: deferred due to pt feeling unwell; 11/22: able to complete with mod assist; 03/03/2022: unable to complete STS with push to stand technique; 06/09/2022 able to complete with mod assist 1x  Goal status: MET   2.   Patient (< 24 years old) will be able to perform and complete five times sit to stand test from standard chair height in <30 seconds to indicate increased LE strength and improved balance. Baseline: Pt able to complete only 1 stand, insufficient strength currently to complete full test; 10/17 31 seconds pull to stand with UE, close CGA-min a 11/16: deferred due to pt feeling unwell; 03/03/2022: 30 sec pull to stand technique used; 06/09/2022: 24 seconds pull to stand  Goal status:  MET  3.  Pt will improve LLE strength by at least 1/2 point on MMT in order to increase ease with transfers and functional mobility. Baseline: LLE grossly 4/5 throughout; 10/17: still generally 4/5 B, however now exhibits AROM with R hip flexion and knee ext; 11/16: 4+/5 LLE, RLE 3-/5, noted improvement however in quads and hip flexors; 06/09/2022: LLE strength grossly 4+/5  Goal status: MET  4.  Patient will increase 10 meter walk test to at least 0.80 m/s as to improve gait  speed for better community ambulation and to reduce fall risk. Baseline: to be tested next 1-2 visits; 10/17: pt unable to perform 11/16: deferred due to pt feeling unwell; 11/21: 0.17 m/s; 03/03/22: 0.13 m/s with HW; 06/09/2022 0.185 m/s with HW; 07/28/22: 0.13 m/s with HW and WC follow; 09/29/2022= 0.11 m/s Goal status: IN PROGRESS  5.  Pt will complete STS from standard height chair with UUE assist off armrests and with no greater than min assist to improve ease and safety with transfers. Baseline: mod a x2; 10/17: pulling to stand with LUE CGA 11/16: deferred due to pt feeling unwell; 11/21: currently requires mod a to complete; 03/03/2022: pt attempted indep today but unable to complete; 06/09/2022 able to complete with mod assist 1 07/28/22: able to complete with min assist  Goal status: MET,   6.  Pt will improve RLE strength by at least 1/2 point on MMT in order to increase ease with transfers and functional mobility. Baseline: 11/16: 4+/5 LLE, RLE 3-/5, noted improvement however in quads and hip flexors on R; 03/03/2022: 3-/5 RLE, noted increased tone throughout RLE musculature; 06/09/2022: strength still grossly 3-/5 RLE, however R quad strength has improved to 4-/5 ; 07/28/22: strength grossly 3-/5, R quad 4/5  (likely largely due to spasticity); 09/29/2022=3-/5 Right hip flex/ext/knee ext Goal status: IN PROGRESS   7. Patient will ambulate > 300 feet using most appropriate  AD and CGA without rest for improved gait ability with  Household and short community distances.     Baseline: 09/29/2022= 100 feet with HW,     CGA with close w/c follow.    Status: NEW      ASSESSMENT:  CLINICAL IMPRESSION: Patient presents with good motivation for today's recert visit. She has missed over 1 month of PT and comes back today after recent fall with more difficulty with mobility- slight decline in gait speed after some recent gains.  Patient's condition has the potential to improve in response to therapy.  Maximum improvement is yet to be obtained. The anticipated improvement is attainable and reasonable in a generally predictable time.  The pt would benefit from continued skilled PT to address immobility, LE muscle weakness, and pain in order to decrease fall risk and increase functional mobility and QOL.   OBJECTIVE IMPAIRMENTS Abnormal gait, decreased activity tolerance, decreased balance, decreased coordination, decreased endurance, decreased mobility, difficulty walking, decreased ROM, decreased strength, hypomobility, increased edema, increased fascial restrictions, increased muscle spasms, impaired flexibility, impaired sensation, impaired tone, impaired UE functional use, improper body mechanics, postural dysfunction, and pain.   ACTIVITY LIMITATIONS carrying, lifting, bending, sitting, standing, squatting, stairs, transfers, bed mobility, bathing, toileting, dressing, reach over head, hygiene/grooming, locomotion level, and caring for others  PARTICIPATION LIMITATIONS: meal prep, cleaning, laundry, medication management, personal finances, driving, shopping, community activity, occupation, and yard work  PERSONAL [place], Past/current experiences, Sex, and 3+ comorbidities: Anorexia, dehydration, Adult Failure to thrive, contracture of muscles multiple sites, multiple falls, UTI, extensive PMH refer to chart  are also affecting patient's functional outcome.   REHAB POTENTIAL: Fair    CLINICAL DECISION MAKING: Evolving/moderate complexity  EVALUATION COMPLEXITY: High  PLAN: PT FREQUENCY: 2x/week  PT DURATION: 12 weeks  PLANNED INTERVENTIONS: Therapeutic exercises, Therapeutic activity, Neuromuscular re-education, Balance training, Gait training, Patient/Family education, Self Care, Joint mobilization, Joint manipulation, Stair training, Vestibular training, Canalith repositioning, Orthotic/Fit training, DME instructions, Wheelchair mobility training, Spinal mobilization,  Cryotherapy, Moist heat, scar mobilization, Splintting, Taping, Manual therapy, and Re-evaluation  PLAN FOR NEXT SESSION:  stretching, strengthening, gait, balance, manual, standing balance, RLE stretching, continue plan 1:21 PM, 09/30/22   Lenda Kelp, PT 09/30/2022, 1:21 PM

## 2022-10-06 ENCOUNTER — Ambulatory Visit: Payer: Medicare Other | Attending: Adult Health Nurse Practitioner

## 2022-10-06 DIAGNOSIS — M25661 Stiffness of right knee, not elsewhere classified: Secondary | ICD-10-CM | POA: Insufficient documentation

## 2022-10-06 DIAGNOSIS — R278 Other lack of coordination: Secondary | ICD-10-CM | POA: Diagnosis present

## 2022-10-06 DIAGNOSIS — R2681 Unsteadiness on feet: Secondary | ICD-10-CM | POA: Diagnosis present

## 2022-10-06 DIAGNOSIS — R262 Difficulty in walking, not elsewhere classified: Secondary | ICD-10-CM | POA: Insufficient documentation

## 2022-10-06 DIAGNOSIS — M6281 Muscle weakness (generalized): Secondary | ICD-10-CM | POA: Insufficient documentation

## 2022-10-06 NOTE — Therapy (Signed)
OUTPATIENT PHYSICAL THERAPY TREATMENT   Patient Name: NASIR STOLTMAN MRN: [id number] DOB:1971-01-19, 52 y.o., female Today's Date: 10/06/2022   PCP: Wilford Corner, PA-C REFERRING PROVIDER: Etta Quill, Anette Riedel, PA-C   PT End of Session - 10/06/22 1116     Visit Number 34    Number of Visits 50    Date for PT Re-Evaluation 12/22/22    Authorization Type [employer] Medicare    Authorization Time Period 09/16/21-12/09/21    PT Start Time 1106    PT Stop Time 1140    PT Time Calculation (min) 34 min    Equipment Utilized During Treatment Gait belt    Activity Tolerance Patient tolerated treatment well    Behavior During Therapy WFL for tasks assessed/performed                   Past Medical History:  Diagnosis Date   ABDOMINAL PAIN OTHER SPECIFIED SITE 02/21/2009   Qualifier: Diagnosis of  By: Dayton Martes MD, Talia     ABDOMINAL PAIN-LUQ 04/17/2009   Qualifier: Diagnosis of  By: Russella Dar MD Bronson Curb T    Anal fissure    Anginal pain (HCC)    Asthma    Depression    Diabetes mellitus without complication (HCC)    Dyspnea    Dysrhythmia    Headache(784.0)    HEMATOCHEZIA 04/17/2009   Qualifier: Diagnosis of  By: Russella Dar MD Bronson Curb T    Hyperlipidemia    Hypertension    IBS (irritable bowel syndrome)    Migraine headache 11/06/2008   Qualifier: Diagnosis of  By: Dayton Martes MD, Talia     OVARIAN CYST, RIGHT 02/21/2009   Qualifier: Diagnosis of  By: Dayton Martes MD, Talia     PALPITATIONS, OCCASIONAL 04/17/2010   Qualifier: Diagnosis of  By: Dayton Martes MD, Talia     Pancreatitis 10/14/2017   Past Surgical History:  Procedure Laterality Date   BREAST EXCISIONAL BIOPSY Left 1997   ? left side, done at time of reduction , benign   BREAST REDUCTION SURGERY     bilateral   CESAREAN SECTION     CHOLECYSTECTOMY     COLONOSCOPY     ESOPHAGOGASTRODUODENOSCOPY     ESOPHAGOGASTRODUODENOSCOPY (EGD) WITH PROPOFOL N/A 10/21/2020   Procedure: ESOPHAGOGASTRODUODENOSCOPY (EGD) WITH  PROPOFOL;  Surgeon: Regis Bill, MD;  Location: [facility] ENDOSCOPY;  Service: Endoscopy;  Laterality: N/A;   REDUCTION MAMMAPLASTY Bilateral 1997   VAGINAL HYSTERECTOMY     Patient Active Problem List   Diagnosis Date Noted   Abnormal MRI, lumbar spine (01/29/2020) 02/19/2020   Anxiety due to MRI 01/25/2020   Claustrophobia associated to MRI 01/25/2020   Enthesopathy of knee region (Right) 01/25/2020   Enthesopathy of knee region (Left) 01/25/2020   Lumbar facet hypertrophy 01/25/2020   Lumbosacral radiculopathy at S1 (Left) 01/17/2020   Lumbosacral radiculopathy (S1) (Left) 01/17/2020   Chronic lower extremity pain (Bilateral) (L>R) 01/17/2020   History of pancreatitis 07/18/2019   Obstructive sleep apnea syndrome 01/02/2019   Lumbar facet arthropathy (Multilevel) (Bilateral) 12/14/2018   Osteoarthritis involving multiple joints 11/28/2018   Chronic hip pain (Left) 11/16/2018   Acute hip pain (Left) 11/16/2018   DDD (degenerative disc disease), lumbosacral 11/16/2018   Chronic hip pain (Secondary source of pain) (Bilateral) (L>R) 05/19/2017   Elevated C-reactive protein (CRP) 05/19/2017   Elevated sed rate 05/19/2017   Spondylosis without myelopathy or radiculopathy, lumbosacral region 05/19/2017   Pharmacologic therapy 05/06/2017   Disorder of skeletal system 05/06/2017  Problems influencing health status 05/06/2017   Chest pain with moderate risk of acute coronary syndrome 04/21/2017   DOE (dyspnea on exertion) 04/21/2017   Intermittent palpitations 04/21/2017   Vitamin D insufficiency 05/04/2016   Depression 02/04/2016   Fatty liver 02/04/2016   Hypertension associated with diabetes (HCC) 02/04/2016   Chronic pain syndrome 01/15/2016   Acute low back pain 05/28/2015   Lumbar transverse process fracture (HCC) (Left L1) (03/11/15) 05/28/2015   Long term current use of opiate analgesic 05/01/2015   Long term prescription opiate use 05/01/2015   Opiate use (25 MME/Day)  05/01/2015   Encounter for therapeutic drug level monitoring 05/01/2015   Encounter for pain management planning 05/01/2015   Chronic low back pain (Primary Source of Pain) (Bilateral) (L>R) 05/01/2015   Lumbar facet syndrome (Bilateral) (L>R) 05/01/2015   Chronic knee pain [facility] source of pain) (Bilateral) (L>R) 05/01/2015   Lumbar spondylosis 05/01/2015   Neuropathic pain 05/01/2015   Neurogenic pain 05/01/2015   Myofascial pain 05/01/2015   Essential hypertriglyceridemia 04/26/2014   Type 2 diabetes mellitus with hyperglycemia, with long-term current use of insulin (HCC) 04/26/2014   Anxiety 05/15/2010   Obesity 05/15/2010   Hyperlipidemia 04/03/2010   Fatty liver disease 04/02/2010   Pure hypercholesterolemia 03/19/2010   GERD 04/17/2009   Constipation 04/17/2009   TOBACCO ABUSE 11/06/2008    ONSET DATE: January 2023  REFERRING DIAG: C71.9 (ICD-10-CM) - Malignant neoplasm of brain, unspecified  THERAPY DIAG:  Muscle weakness (generalized)  Other lack of coordination  Unsteadiness on feet  Difficulty in walking, not elsewhere classified  Stiffness of right knee, not elsewhere classified  Rationale for Evaluation and Treatment Rehabilitation  SUBJECTIVE:                                                                                                                                                                                              SUBJECTIVE STATEMENT: No recent updates. Pt reports Left shoulder still bothersome.   Pt accompanied by:  [name]  PAIN:  Are you having pain? 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 Pt's husband, [name], present for evaluation, provides hx 2/2 [name]. Pt reports rt hemiplegia. Pt ambulates short distances in home with hemi-walker, WC out of home. Previously incompatible with  AFO. Pt struggled with re current falls, no significant injury sustained. Pt reports she has constant head pain. Pt spouse reports pt has pain throughout her whole right side. Pt sleeps in recliner, wakes up stiff in the  morning. PMH: anorexia, dehydration, contracture of muscles multiple sites.  PRECAUTIONS: Fall  WEIGHT BEARING RESTRICTIONS No  FALLS: Has patient fallen in last 6 months? Yes. Number of falls 12  LIVING ENVIRONMENT: information primarily from chart Lives with: lives with their spouse Lives in: House/apartment Stairs: Yes: External: 4 steps; bilateral but cannot reach both Has following equipment at home: Single point cane, Walker - 4 wheeled, Hemi walker, shower chair, bed side commode, Grab bars, and lift chair  PLOF: Independent  PATIENT GOALS  "Normal function" as much as possible. Improving her balance.   TODAY'S TREATMENT:  10/06/22 -STS from WC, HW left, minA 3x15sec stance -STS from WC, HW left, minA 4x15sec stance -STS from WC, HW left 1x4  Rt ankle DF stretching, Rt quads stretching.  -AMB 41ft c HW Left, minGuardA, WC follow (minA STS)  -AMB 53ft c HW Left, minGuardA, WC follow (minA STS), 2lb AW Right  -AMB 34ft c HW Left, minGuardA, WC follow (minA STS), 2lb AW right, friction reduction tap applied to shoe Rt at toe spring   PATIENT EDUCATION: Education details: Pt educated throughout session about proper posture and technique with exercises. Improved exercise technique, movement at target joints, use of target muscles after min to mod verbal, visual, tactile cues. Person educated: pt Education method:Explanation, Demonstration, Tactile cues, and Verbal cues, Education comprehension: verbalized understanding, returned demonstration, tactile cues required, and needs further education   HOME EXERCISE PROGRAM:  pt to continue HEP as given  Access Code: [email or web address] URL: [email or web address] Date: 11/18/2021 Prepared by: Temple Pacini  Exercises - Elbow Extension with Anchored Resistance  - 1 x daily - 5 x weekly - 3 sets - 10 reps  Access Code: [email or web address] URL: [email or web address] Date: 10/02/2021 Prepared by: Maureen Ralphs  Exercises - Seated Hip Flexion March with Ankle Weights  - 1 x daily - 7 x weekly - 3 sets - 10 reps - Seated Long Arc Quad  - 1 x daily - 7 x weekly - 3 sets - 10 reps - Supine Quad Set  - 1 x daily - 7 x weekly - 3 sets - 10 reps - Proper Sit to Stand Technique  - 1 x daily - 7 x weekly - 3 sets - 10 reps  GOALS: Goals reviewed with patient? Yes  SHORT TERM GOALS: Target date: 11/10/2022   Patient will be independent in home exercise program to improve strength/mobility for better functional independence with ADLs. Baseline: to be initiated next 1-2 sessions; 10/17 previously issued, pt is 11/16: to be advanced as pt has gained strength since previous assessment ; 06/09/2022: Pt reports performing her HEP every once in a while; 07/28/22:  improved activity, but not consistent with HEP;8/27/204- Patient reports has not been able to do much exercise due to recent fall with increased overall pain Goal status: ONGOING   LONG TERM GOALS: Target date: 12/22/2022   Patient will demo ability to perform at least 1 STS by pushing from [facility] with LUE instead of pulling to stand to improve functional mobility Baseline: Goal changed as pt FOTO tracked by OT, with new goal: pt currently pulls to stand; 11/16: deferred due to pt feeling unwell; 11/22: able to complete with mod assist; 03/03/2022: unable to complete STS with push to stand technique; 06/09/2022 able to complete with mod assist 1x  Goal status: MET   2.   Patient (< 76 years old) will be able to perform and complete five times sit to stand test from standard chair  height in <30 seconds to indicate increased LE strength and improved balance. Baseline: Pt able to complete only 1 stand, insufficient strength currently to  complete full test; 10/17 31 seconds pull to stand with UE, close CGA-min a 11/16: deferred due to pt feeling unwell; 03/03/2022: 30 sec pull to stand technique used; 06/09/2022: 24 seconds pull to stand  Goal status:  MET  3.  Pt will improve LLE strength by at least 1/2 point on MMT in order to increase ease with transfers and functional mobility. Baseline: LLE grossly 4/5 throughout; 10/17: still generally 4/5 B, however now exhibits AROM with R hip flexion and knee ext; 11/16: 4+/5 LLE, RLE 3-/5, noted improvement however in quads and hip flexors; 06/09/2022: LLE strength grossly 4+/5  Goal status: MET  4.  Patient will increase 10 meter walk test to at least 0.80 m/s as to improve gait speed for better community ambulation and to reduce fall risk. Baseline: to be tested next 1-2 visits; 10/17: pt unable to perform 11/16: deferred due to pt feeling unwell; 11/21: 0.17 m/s; 03/03/22: 0.13 m/s with HW; 06/09/2022 0.185 m/s with HW; 07/28/22: 0.13 m/s with HW and WC follow; 09/29/2022= 0.11 m/s Goal status: IN PROGRESS  5.  Pt will complete STS from standard height chair with UUE assist off armrests and with no greater than min assist to improve ease and safety with transfers. Baseline: mod a x2; 10/17: pulling to stand with LUE CGA 11/16: deferred due to pt feeling unwell; 11/21: currently requires mod a to complete; 03/03/2022: pt attempted indep today but unable to complete; 06/09/2022 able to complete with mod assist 1 07/28/22: able to complete with min assist  Goal status: MET,   6.  Pt will improve RLE strength by at least 1/2 point on MMT in order to increase ease with transfers and functional mobility. Baseline: 11/16: 4+/5 LLE, RLE 3-/5, noted improvement however in quads and hip flexors on R; 03/03/2022: 3-/5 RLE, noted increased tone throughout RLE musculature; 06/09/2022: strength still grossly 3-/5 RLE, however R quad strength has improved to 4-/5 ; 07/28/22: strength grossly 3-/5, R quad 4/5  (likely  largely due to spasticity); 09/29/2022=3-/5 Right hip flex/ext/knee ext Goal status: IN PROGRESS   7. Patient will ambulate > 300 feet using most appropriate  AD and CGA without rest for improved gait ability with  Household and short community distances.     Baseline: 09/29/2022= 100 feet with HW,     CGA with close w/c follow.    Status: NEW      ASSESSMENT:  CLINICAL IMPRESSION: Pt partakes in conitnued transfers and gait training. Endurance with AMB is steadily progressing, however strength with transfers remains quite limited. Education given on reducing foot drop complications. Patient's condition has the potential to improve in response to therapy. Maximum improvement is yet to be obtained. The anticipated improvement is attainable and reasonable in a generally predictable time.  The pt would benefit from continued skilled PT to address immobility, LE muscle weakness, and pain in order to decrease fall risk and increase functional mobility and QOL.   OBJECTIVE IMPAIRMENTS Abnormal gait, decreased activity tolerance, decreased balance, decreased coordination, decreased endurance, decreased mobility, difficulty walking, decreased ROM, decreased strength, hypomobility, increased edema, increased fascial restrictions, increased muscle spasms, impaired flexibility, impaired sensation, impaired tone, impaired UE functional use, improper body mechanics, postural dysfunction, and pain.   ACTIVITY LIMITATIONS carrying, lifting, bending, sitting, standing, squatting, stairs, transfers, bed mobility, bathing, toileting, dressing, reach over head, hygiene/grooming,  locomotion level, and caring for others  PARTICIPATION LIMITATIONS: meal prep, cleaning, laundry, medication management, personal finances, driving, shopping, community activity, occupation, and yard work  PERSONAL [place], Past/current experiences, Sex, and 3+ comorbidities: Anorexia, dehydration, Adult Failure to thrive,  contracture of muscles multiple sites, multiple falls, UTI, extensive PMH refer to chart  are also affecting patient's functional outcome.   REHAB POTENTIAL: Fair    CLINICAL DECISION MAKING: Evolving/moderate complexity  EVALUATION COMPLEXITY: High  PLAN: PT FREQUENCY: 2x/week  PT DURATION: 12 weeks  PLANNED INTERVENTIONS: Therapeutic exercises, Therapeutic activity, Neuromuscular re-education, Balance training, Gait training, Patient/Family education, Self Care, Joint mobilization, Joint manipulation, Stair training, Vestibular training, Canalith repositioning, Orthotic/Fit training, DME instructions, Wheelchair mobility training, Spinal mobilization, Cryotherapy, Moist heat, scar mobilization, Splintting, Taping, Manual therapy, and Re-evaluation  PLAN FOR NEXT SESSION:  transfers, gait, balance   12:35 PM, 10/06/22   Jeriel Vivanco C, PT 10/06/2022, 12:35 PM  12:35 PM, 10/06/22 Rosamaria Lints, PT, DPT Physical Therapist - [facility]  Outpatient Physical Therapy- [facility] [phone]

## 2022-10-13 ENCOUNTER — Ambulatory Visit: Payer: Medicare Other

## 2022-10-20 ENCOUNTER — Ambulatory Visit: Payer: Medicare Other

## 2022-10-20 DIAGNOSIS — R262 Difficulty in walking, not elsewhere classified: Secondary | ICD-10-CM

## 2022-10-20 DIAGNOSIS — R2681 Unsteadiness on feet: Secondary | ICD-10-CM

## 2022-10-20 DIAGNOSIS — M6281 Muscle weakness (generalized): Secondary | ICD-10-CM

## 2022-10-20 DIAGNOSIS — R278 Other lack of coordination: Secondary | ICD-10-CM

## 2022-10-20 NOTE — Therapy (Signed)
OUTPATIENT PHYSICAL THERAPY TREATMENT   Patient Name: Kelly Rollins MRN: [id number] DOB:31-Aug-1970, 52 y.o., female Today's Date: 10/20/2022   PCP: Kelly Corner, PA-C REFERRING PROVIDER: Etta Rollins, Kelly Riedel, PA-C   PT End of Session - 10/20/22 1415     Visit Number 35    Number of Visits 50    Date for PT Re-Evaluation 12/22/22    Authorization Type [employer]    Authorization Time Period 09/16/21-12/09/21    Progress Note Due on Visit 40    PT Start Time 1400    PT Stop Time 1440    PT Time Calculation (min) 40 min    Equipment Utilized During Treatment Gait belt    Activity Tolerance Patient tolerated treatment well    Behavior During Therapy WFL for tasks assessed/performed                    Past Medical History:  Diagnosis Date   ABDOMINAL PAIN OTHER SPECIFIED SITE 02/21/2009   Qualifier: Diagnosis of  By: Kelly Rollins, Kelly Rollins     ABDOMINAL PAIN-LUQ 04/17/2009   Qualifier: Diagnosis of  By: Kelly Dar Rollins Kelly Rollins    Anal fissure    Anginal pain (HCC)    Asthma    Depression    Diabetes mellitus without complication (HCC)    Dyspnea    Dysrhythmia    Headache(784.0)    HEMATOCHEZIA 04/17/2009   Qualifier: Diagnosis of  By: Kelly Dar Rollins Kelly Rollins    Hyperlipidemia    Hypertension    IBS (irritable bowel syndrome)    Migraine headache 11/06/2008   Qualifier: Diagnosis of  By: Kelly Rollins, Kelly Rollins     OVARIAN CYST, RIGHT 02/21/2009   Qualifier: Diagnosis of  By: Kelly Rollins, Kelly Rollins     PALPITATIONS, OCCASIONAL 04/17/2010   Qualifier: Diagnosis of  By: Kelly Rollins, Kelly Rollins     Pancreatitis 10/14/2017   Past Surgical History:  Procedure Laterality Date   BREAST EXCISIONAL BIOPSY Left 1997   ? left side, done at time of reduction , benign   BREAST REDUCTION SURGERY     bilateral   CESAREAN SECTION     CHOLECYSTECTOMY     COLONOSCOPY     ESOPHAGOGASTRODUODENOSCOPY     ESOPHAGOGASTRODUODENOSCOPY (EGD) WITH PROPOFOL N/A 10/21/2020   Procedure:  ESOPHAGOGASTRODUODENOSCOPY (EGD) WITH PROPOFOL;  Surgeon: Kelly Bill, Rollins;  Location: [facility];  Service: Endoscopy;  Laterality: N/A;   REDUCTION MAMMAPLASTY Bilateral 1997   VAGINAL HYSTERECTOMY     Patient Active Problem List   Diagnosis Date Noted   Abnormal MRI, lumbar spine (01/29/2020) 02/19/2020   Anxiety due to MRI 01/25/2020   Claustrophobia associated to MRI 01/25/2020   Enthesopathy of knee region (Right) 01/25/2020   Enthesopathy of knee region (Left) 01/25/2020   Lumbar facet hypertrophy 01/25/2020   Lumbosacral radiculopathy at S1 (Left) 01/17/2020   Lumbosacral radiculopathy (S1) (Left) 01/17/2020   Chronic lower extremity pain (Bilateral) (L>R) 01/17/2020   History of pancreatitis 07/18/2019   Obstructive sleep apnea syndrome 01/02/2019   Lumbar facet arthropathy (Multilevel) (Bilateral) 12/14/2018   Osteoarthritis involving multiple joints 11/28/2018   Chronic hip pain (Left) 11/16/2018   Acute hip pain (Left) 11/16/2018   DDD (degenerative disc disease), lumbosacral 11/16/2018   Chronic hip pain (Secondary source of pain) (Bilateral) (L>R) 05/19/2017   Elevated C-reactive protein (CRP) 05/19/2017   Elevated sed rate 05/19/2017   Spondylosis without myelopathy or radiculopathy, lumbosacral region 05/19/2017   Pharmacologic therapy  05/06/2017   Disorder of skeletal system 05/06/2017   Problems influencing health status 05/06/2017   Chest pain with moderate risk of acute coronary syndrome 04/21/2017   DOE (dyspnea on exertion) 04/21/2017   Intermittent palpitations 04/21/2017   Vitamin D insufficiency 05/04/2016   Depression 02/04/2016   Fatty liver 02/04/2016   Hypertension associated with diabetes (HCC) 02/04/2016   Chronic pain syndrome 01/15/2016   Acute low back pain 05/28/2015   Lumbar transverse process fracture (HCC) (Left L1) (03/11/15) 05/28/2015   Long term current use of opiate analgesic 05/01/2015   Long term prescription opiate use  05/01/2015   Opiate use (25 MME/Day) 05/01/2015   Encounter for therapeutic drug level monitoring 05/01/2015   Encounter for pain management planning 05/01/2015   Chronic low back pain (Primary Source of Pain) (Bilateral) (L>R) 05/01/2015   Lumbar facet syndrome (Bilateral) (L>R) 05/01/2015   Chronic knee pain [facility] source of pain) (Bilateral) (L>R) 05/01/2015   Lumbar spondylosis 05/01/2015   Neuropathic pain 05/01/2015   Neurogenic pain 05/01/2015   Myofascial pain 05/01/2015   Essential hypertriglyceridemia 04/26/2014   Type 2 diabetes mellitus with hyperglycemia, with long-term current use of insulin (HCC) 04/26/2014   Anxiety 05/15/2010   Obesity 05/15/2010   Hyperlipidemia 04/03/2010   Fatty liver disease 04/02/2010   Pure hypercholesterolemia 03/19/2010   GERD 04/17/2009   Constipation 04/17/2009   TOBACCO ABUSE 11/06/2008    ONSET DATE: January 2023  REFERRING DIAG: C71.9 (ICD-10-CM) - Malignant neoplasm of brain, unspecified  THERAPY DIAG:  Muscle weakness (generalized)  Other lack of coordination  Unsteadiness on feet  Difficulty in walking, not elsewhere classified  Rationale for Evaluation and Treatment Rehabilitation  SUBJECTIVE:                                                                                                                                                                                              SUBJECTIVE STATEMENT: Pt doing ok. Had brian MRI recently, no results back yet. Husband reports pt has a small trampoline now that she has been getting on for bounce therapy.    Pt accompanied by:  Kelly Rollins  PAIN:  Are you having pain? 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 Pt's husband, Kelly Rollins, present for evaluation, provides hx 2/2 [name]. Pt reports rt  hemiplegia. Pt ambulates short distances in home with hemi-walker, WC out of home. Previously incompatible with AFO. Pt struggled with re current falls, no significant injury  sustained. Pt reports she has constant head pain. Pt spouse reports pt has pain throughout her whole right side. Pt sleeps in recliner, wakes up stiff in the morning. PMH: anorexia, dehydration, contracture of muscles multiple sites.  PRECAUTIONS: Fall  WEIGHT BEARING RESTRICTIONS No  PATIENT GOALS  "Normal function" as much as possible. Improving her balance.   TODAY'S TREATMENT:  10/20/22  -SPT from WC, HW left, minA, to mat -lateral scooting along mat 62ft bilat, no device, supervision level for cues *taxing -SPT c WH 1x left, right plinth to chair -STS from elevtaed seat, HW Left minA 1x8  PATIENT EDUCATION: Education details: Pt educated throughout session about proper posture and technique with exercises. Improved exercise technique, movement at target joints, use of target muscles after min to mod verbal, visual, tactile cues. Person educated: pt Education method:Explanation, Demonstration, Tactile cues, and Verbal cues, Education comprehension: verbalized understanding, returned demonstration, tactile cues required, and needs further education   HOME EXERCISE PROGRAM:  pt to continue HEP as given  Access Code: [email or web address] URL: [email or web address] Date: 11/18/2021 Prepared by: Temple Pacini  Exercises - Elbow Extension with Anchored Resistance  - 1 x daily - 5 x weekly - 3 sets - 10 reps  Access Code: [email or web address] URL: [email or web address] Date: 10/02/2021 Prepared by: Maureen Ralphs  Exercises - Seated Hip Flexion March with Ankle Weights  - 1 x daily - 7 x weekly - 3 sets - 10 reps - Seated Long Arc Quad  - 1 x daily - 7 x weekly - 3 sets - 10 reps - Supine Quad Set  - 1 x daily - 7 x weekly - 3 sets - 10 reps - Proper Sit to Stand Technique  - 1 x daily - 7 x weekly -  3 sets - 10 reps  GOALS: Goals reviewed with patient? Yes  SHORT TERM GOALS: Target date: 11/10/2022   Patient will be independent in home exercise program to improve strength/mobility for better functional independence with ADLs. Baseline: to be initiated next 1-2 sessions; 10/17 previously issued, pt is 11/16: to be advanced as pt has gained strength since previous assessment ; 06/09/2022: Pt reports performing her HEP every once in a while; 07/28/22:  improved activity, but not consistent with HEP;8/27/204- Patient reports has not been able to do much exercise due to recent fall with increased overall pain Goal status: ONGOING   LONG TERM GOALS: Target date: 12/22/2022   Patient will demo ability to perform at least 1 STS by pushing from [facility] with LUE instead of pulling to stand to improve functional mobility Baseline: Goal changed as pt FOTO tracked by OT, with new goal: pt currently pulls to stand; 11/16: deferred due to pt feeling unwell; 11/22: able to complete with mod assist; 03/03/2022: unable to complete STS with push to stand technique; 06/09/2022 able to complete with mod assist 1x  Goal status: MET   2.   Patient (< 44 years old) will be able to perform and complete five times sit to stand test from standard chair height in <30 seconds to indicate increased LE strength and improved balance. Baseline: Pt able to complete only 1 stand, insufficient strength currently to complete full test; 10/17 31 seconds pull to stand with UE, close CGA-min a 11/16: deferred due to pt feeling unwell; 03/03/2022: 30 sec pull to stand technique used; 06/09/2022: 24 seconds pull to stand  Goal status:  MET  3.  Pt will improve LLE strength by at least 1/2 point on MMT in  order to increase ease with transfers and functional mobility. Baseline: LLE grossly 4/5 throughout; 10/17: still generally 4/5 B, however now exhibits AROM with R hip flexion and knee ext; 11/16: 4+/5 LLE, RLE 3-/5, noted improvement however  in quads and hip flexors; 06/09/2022: LLE strength grossly 4+/5  Goal status: MET  4.  Patient will increase 10 meter walk test to at least 0.80 m/s as to improve gait speed for better community ambulation and to reduce fall risk. Baseline: to be tested next 1-2 visits; 10/17: pt unable to perform 11/16: deferred due to pt feeling unwell; 11/21: 0.17 m/s; 03/03/22: 0.13 m/s with HW; 06/09/2022 0.185 m/s with HW; 07/28/22: 0.13 m/s with HW and WC follow; 09/29/2022= 0.11 m/s Goal status: IN PROGRESS  5.  Pt will complete STS from standard height chair with UUE assist off armrests and with no greater than min assist to improve ease and safety with transfers. Baseline: mod a x2; 10/17: pulling to stand with LUE CGA 11/16: deferred due to pt feeling unwell; 11/21: currently requires mod a to complete; 03/03/2022: pt attempted indep today but unable to complete; 06/09/2022 able to complete with mod assist 1 07/28/22: able to complete with min assist  Goal status: MET,   6.  Pt will improve RLE strength by at least 1/2 point on MMT in order to increase ease with transfers and functional mobility. Baseline: 11/16: 4+/5 LLE, RLE 3-/5, noted improvement however in quads and hip flexors on R; 03/03/2022: 3-/5 RLE, noted increased tone throughout RLE musculature; 06/09/2022: strength still grossly 3-/5 RLE, however R quad strength has improved to 4-/5 ; 07/28/22: strength grossly 3-/5, R quad 4/5  (likely largely due to spasticity); 09/29/2022=3-/5 Right hip flex/ext/knee ext Goal status: IN PROGRESS   7. Patient will ambulate > 300 feet using most appropriate  AD and CGA without rest for improved gait ability with  Household and short community distances.     Baseline: 09/29/2022= 100 feet with HW,     CGA with close w/c follow.    Status: NEW   ASSESSMENT:  CLINICAL IMPRESSION:  Cotninued to progress AMB and transfers, hips remain very weak, minA required for rising, but pt's knowledge of tehcnique makes a big  difference in use of HW and obtaining balance once up. The pt would benefit from continued skilled PT to address immobility, LE muscle weakness, and pain in order to decrease fall risk and increase functional mobility and QOL.   OBJECTIVE IMPAIRMENTS Abnormal gait, decreased activity tolerance, decreased balance, decreased coordination, decreased endurance, decreased mobility, difficulty walking, decreased ROM, decreased strength, hypomobility, increased edema, increased fascial restrictions, increased muscle spasms, impaired flexibility, impaired sensation, impaired tone, impaired UE functional use, improper body mechanics, postural dysfunction, and pain.   ACTIVITY LIMITATIONS carrying, lifting, bending, sitting, standing, squatting, stairs, transfers, bed mobility, bathing, toileting, dressing, reach over head, hygiene/grooming, locomotion level, and caring for others  PARTICIPATION LIMITATIONS: meal prep, cleaning, laundry, medication management, personal finances, driving, shopping, community activity, occupation, and yard work  PERSONAL [place], Past/current experiences, Sex, and 3+ comorbidities: Anorexia, dehydration, Adult Failure to thrive, contracture of muscles multiple sites, multiple falls, UTI, extensive PMH refer to chart  are also affecting patient's functional outcome.   REHAB POTENTIAL: Fair    CLINICAL DECISION MAKING: Evolving/moderate complexity  EVALUATION COMPLEXITY: High  PLAN: PT FREQUENCY: 2x/week  PT DURATION: 12 weeks  PLANNED INTERVENTIONS: Therapeutic exercises, Therapeutic activity, Neuromuscular re-education, Balance training, Gait training, Patient/Family education, Self Care, Joint mobilization, Joint manipulation,  Stair training, Vestibular training, Canalith repositioning, Orthotic/Fit training, DME instructions, Wheelchair mobility training, Spinal mobilization, Cryotherapy, Moist heat, scar mobilization, Splintting, Taping, Manual therapy, and  Re-evaluation  PLAN FOR NEXT SESSION:  transfers, gait, balance     Sayed Apostol C, PT 10/20/2022, 2:21 PM  2:34 PM, 10/20/22 Rosamaria Lints, PT, DPT Physical Therapist - [facility]  [phone] [facility])   Physical Therapist - [facility]  Outpatient Physical Therapy- Main Campus [phone]

## 2022-10-27 ENCOUNTER — Ambulatory Visit: Payer: Medicare Other

## 2022-10-27 DIAGNOSIS — M6281 Muscle weakness (generalized): Secondary | ICD-10-CM

## 2022-10-27 DIAGNOSIS — R262 Difficulty in walking, not elsewhere classified: Secondary | ICD-10-CM

## 2022-10-27 DIAGNOSIS — R2681 Unsteadiness on feet: Secondary | ICD-10-CM

## 2022-10-27 DIAGNOSIS — R278 Other lack of coordination: Secondary | ICD-10-CM

## 2022-10-27 NOTE — Therapy (Signed)
OUTPATIENT PHYSICAL THERAPY TREATMENT   Patient Name: Kelly Rollins MRN: [id number] DOB:[birth year], 52 y.o., female Today's Date: 10/27/2022   PCP: Wilford Corner, PA-C REFERRING PROVIDER: Etta Quill, Anette Riedel, PA-C   PT End of Session - 10/27/22 1356     Visit Number 36    Number of Visits 50    Date for PT Re-Evaluation 12/22/22    Authorization Type [employer] Medicare    Authorization Time Period 09/16/21-12/09/21    Progress Note Due on Visit 40    PT Start Time 1401    PT Stop Time 1429    PT Time Calculation (min) 28 min    Equipment Utilized During Treatment Gait belt    Activity Tolerance Patient tolerated treatment well;Patient limited by fatigue    Behavior During Therapy [facility] for tasks assessed/performed                    Past Medical History:  Diagnosis Date   ABDOMINAL PAIN OTHER SPECIFIED SITE 02/21/2009   Qualifier: Diagnosis of  By: Dayton Martes MD, Talia     ABDOMINAL PAIN-LUQ 04/17/2009   Qualifier: Diagnosis of  By: Russella Dar MD Bronson Curb T    Anal fissure    Anginal pain (HCC)    Asthma    Depression    Diabetes mellitus without complication (HCC)    Dyspnea    Dysrhythmia    Headache(784.0)    HEMATOCHEZIA 04/17/2009   Qualifier: Diagnosis of  By: Russella Dar MD Bronson Curb T    Hyperlipidemia    Hypertension    IBS (irritable bowel syndrome)    Migraine headache 11/06/2008   Qualifier: Diagnosis of  By: Dayton Martes MD, Talia     OVARIAN CYST, RIGHT 02/21/2009   Qualifier: Diagnosis of  By: Dayton Martes MD, Talia     PALPITATIONS, OCCASIONAL 04/17/2010   Qualifier: Diagnosis of  By: Dayton Martes MD, Talia     Pancreatitis 10/14/2017   Past Surgical History:  Procedure Laterality Date   BREAST EXCISIONAL BIOPSY Left 1997   ? left side, done at time of reduction , benign   BREAST REDUCTION SURGERY     bilateral   CESAREAN SECTION     CHOLECYSTECTOMY     COLONOSCOPY     ESOPHAGOGASTRODUODENOSCOPY     ESOPHAGOGASTRODUODENOSCOPY (EGD) WITH PROPOFOL N/A  10/21/2020   Procedure: ESOPHAGOGASTRODUODENOSCOPY (EGD) WITH PROPOFOL;  Surgeon: Regis Bill, MD;  Location: [facility] ENDOSCOPY;  Service: Endoscopy;  Laterality: N/A;   REDUCTION MAMMAPLASTY Bilateral 1997   VAGINAL HYSTERECTOMY     Patient Active Problem List   Diagnosis Date Noted   Abnormal MRI, lumbar spine (01/29/2020) 02/19/2020   Anxiety due to MRI 01/25/2020   Claustrophobia associated to MRI 01/25/2020   Enthesopathy of knee region (Right) 01/25/2020   Enthesopathy of knee region (Left) 01/25/2020   Lumbar facet hypertrophy 01/25/2020   Lumbosacral radiculopathy at S1 (Left) 01/17/2020   Lumbosacral radiculopathy (S1) (Left) 01/17/2020   Chronic lower extremity pain (Bilateral) (L>R) 01/17/2020   History of pancreatitis 07/18/2019   Obstructive sleep apnea syndrome 01/02/2019   Lumbar facet arthropathy (Multilevel) (Bilateral) 12/14/2018   Osteoarthritis involving multiple joints 11/28/2018   Chronic hip pain (Left) 11/16/2018   Acute hip pain (Left) 11/16/2018   DDD (degenerative disc disease), lumbosacral 11/16/2018   Chronic hip pain (Secondary source of pain) (Bilateral) (L>R) 05/19/2017   Elevated C-reactive protein (CRP) 05/19/2017   Elevated sed rate 05/19/2017   Spondylosis without myelopathy or radiculopathy, lumbosacral region 05/19/2017  Pharmacologic therapy 05/06/2017   Disorder of skeletal system 05/06/2017   Problems influencing health status 05/06/2017   Chest pain with moderate risk of acute coronary syndrome 04/21/2017   DOE (dyspnea on exertion) 04/21/2017   Intermittent palpitations 04/21/2017   Vitamin D insufficiency 05/04/2016   Depression 02/04/2016   Fatty liver 02/04/2016   Hypertension associated with diabetes (HCC) 02/04/2016   Chronic pain syndrome 01/15/2016   Acute low back pain 05/28/2015   Lumbar transverse process fracture (HCC) (Left L1) (03/11/15) 05/28/2015   Long term current use of opiate analgesic 05/01/2015   Long term  prescription opiate use 05/01/2015   Opiate use (25 MME/Day) 05/01/2015   Encounter for therapeutic drug level monitoring 05/01/2015   Encounter for pain management planning 05/01/2015   Chronic low back pain (Primary Source of Pain) (Bilateral) (L>R) 05/01/2015   Lumbar facet syndrome (Bilateral) (L>R) 05/01/2015   Chronic knee pain [facility] source of pain) (Bilateral) (L>R) 05/01/2015   Lumbar spondylosis 05/01/2015   Neuropathic pain 05/01/2015   Neurogenic pain 05/01/2015   Myofascial pain 05/01/2015   Essential hypertriglyceridemia 04/26/2014   Type 2 diabetes mellitus with hyperglycemia, with long-term current use of insulin (HCC) 04/26/2014   Anxiety 05/15/2010   Obesity 05/15/2010   Hyperlipidemia 04/03/2010   Fatty liver disease 04/02/2010   Pure hypercholesterolemia 03/19/2010   GERD 04/17/2009   Constipation 04/17/2009   TOBACCO ABUSE 11/06/2008    ONSET DATE: January 2023  REFERRING DIAG: C71.9 (ICD-10-CM) - Malignant neoplasm of brain, unspecified  THERAPY DIAG:  Muscle weakness (generalized)  Difficulty in walking, not elsewhere classified  Other lack of coordination  Unsteadiness on feet  Rationale for Evaluation and Treatment Rehabilitation  SUBJECTIVE:                                                                                                                                                                                              SUBJECTIVE STATEMENT: Pt reports she has received her power chair. Her L shoulder is also doing much better. Pt reports no stumbles/falls. Pt reports she needs to leave appointment early today.  Pt accompanied by:  [name]  PAIN:  Are you having pain? 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 Pt's husband, [name], present for evaluation, provides hx 2/2 [name]. Pt reports rt hemiplegia.  Pt ambulates short distances in home with hemi-walker, WC out of home. Previously incompatible with AFO. Pt struggled with re current falls, no significant injury sustained. Pt reports she has constant head pain.  Pt spouse reports pt has pain throughout her whole right side. Pt sleeps in recliner, wakes up stiff in the morning. PMH: anorexia, dehydration, contracture of muscles multiple sites.  PRECAUTIONS: Fall  WEIGHT BEARING RESTRICTIONS No  PATIENT GOALS  "Normal function" as much as possible. Improving her balance.   TODAY'S TREATMENT:   10/27/22   TE:  Ambulation for strength/endurance for approx 60 ft with HW, WC follow, CGA STS from increased seat height (23 inch) 6x hands-free with emphasis on sustaining standing balance for 2-3 sec prior to sitting back down Ambulation for strength/endurance with HW, WC follow, CGA x 72 ft   PATIENT EDUCATION: Education details: Pt educated throughout session about proper posture and technique with exercises. Improved exercise technique, movement at target joints, use of target muscles after min to mod verbal, visual, tactile cues. Person educated: pt Education method:Explanation, Demonstration, Tactile cues, and Verbal cues, Education comprehension: verbalized understanding, returned demonstration, tactile cues required, and needs further education   HOME EXERCISE PROGRAM:  pt to continue HEP as given  Access Code: [email or web address] URL: [email or web address] Date: 11/18/2021 Prepared by: Temple Pacini  Exercises - Elbow Extension with Anchored Resistance  - 1 x daily - 5 x weekly - 3 sets - 10 reps  Access Code: [email or web address] URL: [email or web address] Date: 10/02/2021 Prepared by: Maureen Ralphs  Exercises - Seated Hip Flexion March with Ankle Weights  - 1 x daily - 7 x weekly - 3 sets - 10 reps - Seated Long Arc Quad  - 1 x daily - 7 x weekly - 3 sets - 10 reps - Supine Quad Set  - 1 x daily - 7 x weekly - 3  sets - 10 reps - Proper Sit to Stand Technique  - 1 x daily - 7 x weekly - 3 sets - 10 reps  GOALS: Goals reviewed with patient? Yes  SHORT TERM GOALS: Target date: 11/10/2022   Patient will be independent in home exercise program to improve strength/mobility for better functional independence with ADLs. Baseline: to be initiated next 1-2 sessions; 10/17 previously issued, pt is 11/16: to be advanced as pt has gained strength since previous assessment ; 06/09/2022: Pt reports performing her HEP every once in a while; 07/28/22:  improved activity, but not consistent with HEP;8/27/204- Patient reports has not been able to do much exercise due to recent fall with increased overall pain Goal status: ONGOING   LONG TERM GOALS: Target date: 12/22/2022   Patient will demo ability to perform at least 1 STS by pushing from [facility] with LUE instead of pulling to stand to improve functional mobility Baseline: Goal changed as pt FOTO tracked by OT, with new goal: pt currently pulls to stand; 11/16: deferred due to pt feeling unwell; 11/22: able to complete with mod assist; 03/03/2022: unable to complete STS with push to stand technique; 06/09/2022 able to complete with mod assist 1x  Goal status: MET   2.   Patient (< 40 years old) will be able to perform and complete five times sit to stand test from standard chair height in <30 seconds to indicate increased LE strength and improved balance. Baseline: Pt able to complete only 1 stand, insufficient strength currently to complete full test; 10/17 31 seconds pull to stand with UE, close CGA-min a 11/16: deferred due to pt feeling unwell; 03/03/2022: 30 sec pull to stand technique used; 06/09/2022: 24 seconds pull to stand  Goal status:  MET  3.  Pt will improve LLE strength by at least  1/2 point on MMT in order to increase ease with transfers and functional mobility. Baseline: LLE grossly 4/5 throughout; 10/17: still generally 4/5 B, however now exhibits AROM with R  hip flexion and knee ext; 11/16: 4+/5 LLE, RLE 3-/5, noted improvement however in quads and hip flexors; 06/09/2022: LLE strength grossly 4+/5  Goal status: MET  4.  Patient will increase 10 meter walk test to at least 0.80 m/s as to improve gait speed for better community ambulation and to reduce fall risk. Baseline: to be tested next 1-2 visits; 10/17: pt unable to perform 11/16: deferred due to pt feeling unwell; 11/21: 0.17 m/s; 03/03/22: 0.13 m/s with HW; 06/09/2022 0.185 m/s with HW; 07/28/22: 0.13 m/s with HW and WC follow; 09/29/2022= 0.11 m/s Goal status: IN PROGRESS  5.  Pt will complete STS from standard height chair with UUE assist off armrests and with no greater than min assist to improve ease and safety with transfers. Baseline: mod a x2; 10/17: pulling to stand with LUE CGA 11/16: deferred due to pt feeling unwell; 11/21: currently requires mod a to complete; 03/03/2022: pt attempted indep today but unable to complete; 06/09/2022 able to complete with mod assist 1 07/28/22: able to complete with min assist  Goal status: MET,   6.  Pt will improve RLE strength by at least 1/2 point on MMT in order to increase ease with transfers and functional mobility. Baseline: 11/16: 4+/5 LLE, RLE 3-/5, noted improvement however in quads and hip flexors on R; 03/03/2022: 3-/5 RLE, noted increased tone throughout RLE musculature; 06/09/2022: strength still grossly 3-/5 RLE, however R quad strength has improved to 4-/5 ; 07/28/22: strength grossly 3-/5, R quad 4/5  (likely largely due to spasticity); 09/29/2022=3-/5 Right hip flex/ext/knee ext Goal status: IN PROGRESS   7. Patient will ambulate > 300 feet using most appropriate  AD and CGA without rest for improved gait ability with  Household and short community distances.     Baseline: 09/29/2022= 100 feet with HW,     CGA with close w/c follow.    Status: NEW   ASSESSMENT:  CLINICAL IMPRESSION:  Pt with excellent motivation to participate in PT. She  exhibits ability to complete multiple STS hands-free from 23" seat height and requires minimal seated rest between this and bouts of ambulation. The pt would benefit from continued skilled PT to address immobility, LE muscle weakness, and pain in order to decrease fall risk and increase functional mobility and QOL.   OBJECTIVE IMPAIRMENTS Abnormal gait, decreased activity tolerance, decreased balance, decreased coordination, decreased endurance, decreased mobility, difficulty walking, decreased ROM, decreased strength, hypomobility, increased edema, increased fascial restrictions, increased muscle spasms, impaired flexibility, impaired sensation, impaired tone, impaired UE functional use, improper body mechanics, postural dysfunction, and pain.   ACTIVITY LIMITATIONS carrying, lifting, bending, sitting, standing, squatting, stairs, transfers, bed mobility, bathing, toileting, dressing, reach over head, hygiene/grooming, locomotion level, and caring for others  PARTICIPATION LIMITATIONS: meal prep, cleaning, laundry, medication management, personal finances, driving, shopping, community activity, occupation, and yard work  PERSONAL [place], Past/current experiences, Sex, and 3+ comorbidities: Anorexia, dehydration, Adult Failure to thrive, contracture of muscles multiple sites, multiple falls, UTI, extensive PMH refer to chart  are also affecting patient's functional outcome.   REHAB POTENTIAL: Fair    CLINICAL DECISION MAKING: Evolving/moderate complexity  EVALUATION COMPLEXITY: High  PLAN: PT FREQUENCY: 2x/week  PT DURATION: 12 weeks  PLANNED INTERVENTIONS: Therapeutic exercises, Therapeutic activity, Neuromuscular re-education, Balance training, Gait training, Patient/Family education, Self Care,  Joint mobilization, Joint manipulation, Stair training, Vestibular training, Canalith repositioning, Orthotic/Fit training, DME instructions, Wheelchair mobility training, Spinal  mobilization, Cryotherapy, Moist heat, scar mobilization, Splintting, Taping, Manual therapy, and Re-evaluation  PLAN FOR NEXT SESSION:  transfers, gait, balance     Baird Kay, PT 10/27/2022, 2:40 PM  2:40 PM, 10/27/22 Physical Therapist - [facility]  [phone] [facility])   Physical Therapist - [facility]  Outpatient Physical Therapy- Main Campus [phone]

## 2022-11-03 ENCOUNTER — Ambulatory Visit: Payer: Medicare Other | Attending: Adult Health Nurse Practitioner

## 2022-11-03 DIAGNOSIS — R278 Other lack of coordination: Secondary | ICD-10-CM | POA: Insufficient documentation

## 2022-11-03 DIAGNOSIS — R262 Difficulty in walking, not elsewhere classified: Secondary | ICD-10-CM | POA: Insufficient documentation

## 2022-11-03 DIAGNOSIS — M6281 Muscle weakness (generalized): Secondary | ICD-10-CM | POA: Diagnosis present

## 2022-11-03 DIAGNOSIS — R2681 Unsteadiness on feet: Secondary | ICD-10-CM | POA: Diagnosis present

## 2022-11-03 DIAGNOSIS — M25661 Stiffness of right knee, not elsewhere classified: Secondary | ICD-10-CM | POA: Diagnosis present

## 2022-11-03 NOTE — Therapy (Signed)
OUTPATIENT PHYSICAL THERAPY TREATMENT   Patient Name: Kelly Rollins MRN: [id number] DOB:[birth year], 52 y.o., female Today's Date: 11/03/2022   PCP: Wilford Corner, PA-C REFERRING PROVIDER: Etta Quill, Anette Riedel, PA-C   PT End of Session - 11/03/22 1610     Visit Number 37    Number of Visits 50    Date for PT Re-Evaluation 12/22/22    Authorization Type [employer] Medicare    Authorization Time Period 09/16/21-12/09/21    Progress Note Due on Visit 40    PT Start Time 1534    PT Stop Time 1610    PT Time Calculation (min) 36 min    Equipment Utilized During Treatment Gait belt    Activity Tolerance Patient tolerated treatment well;Patient limited by fatigue    Behavior During Therapy [facility] for tasks assessed/performed                     Past Medical History:  Diagnosis Date   ABDOMINAL PAIN OTHER SPECIFIED SITE 02/21/2009   Qualifier: Diagnosis of  By: Dayton Martes MD, Talia     ABDOMINAL PAIN-LUQ 04/17/2009   Qualifier: Diagnosis of  By: Russella Dar MD Bronson Curb T    Anal fissure    Anginal pain (HCC)    Asthma    Depression    Diabetes mellitus without complication (HCC)    Dyspnea    Dysrhythmia    Headache(784.0)    HEMATOCHEZIA 04/17/2009   Qualifier: Diagnosis of  By: Russella Dar MD Bronson Curb T    Hyperlipidemia    Hypertension    IBS (irritable bowel syndrome)    Migraine headache 11/06/2008   Qualifier: Diagnosis of  By: Dayton Martes MD, Talia     OVARIAN CYST, RIGHT 02/21/2009   Qualifier: Diagnosis of  By: Dayton Martes MD, Talia     PALPITATIONS, OCCASIONAL 04/17/2010   Qualifier: Diagnosis of  By: Dayton Martes MD, Talia     Pancreatitis 10/14/2017   Past Surgical History:  Procedure Laterality Date   BREAST EXCISIONAL BIOPSY Left 1997   ? left side, done at time of reduction , benign   BREAST REDUCTION SURGERY     bilateral   CESAREAN SECTION     CHOLECYSTECTOMY     COLONOSCOPY     ESOPHAGOGASTRODUODENOSCOPY     ESOPHAGOGASTRODUODENOSCOPY (EGD) WITH PROPOFOL N/A  10/21/2020   Procedure: ESOPHAGOGASTRODUODENOSCOPY (EGD) WITH PROPOFOL;  Surgeon: Regis Bill, MD;  Location: [facility] ENDOSCOPY;  Service: Endoscopy;  Laterality: N/A;   REDUCTION MAMMAPLASTY Bilateral 1997   VAGINAL HYSTERECTOMY     Patient Active Problem List   Diagnosis Date Noted   Abnormal MRI, lumbar spine (01/29/2020) 02/19/2020   Anxiety due to MRI 01/25/2020   Claustrophobia associated to MRI 01/25/2020   Enthesopathy of knee region (Right) 01/25/2020   Enthesopathy of knee region (Left) 01/25/2020   Lumbar facet hypertrophy 01/25/2020   Lumbosacral radiculopathy at S1 (Left) 01/17/2020   Lumbosacral radiculopathy (S1) (Left) 01/17/2020   Chronic lower extremity pain (Bilateral) (L>R) 01/17/2020   History of pancreatitis 07/18/2019   Obstructive sleep apnea syndrome 01/02/2019   Lumbar facet arthropathy (Multilevel) (Bilateral) 12/14/2018   Osteoarthritis involving multiple joints 11/28/2018   Chronic hip pain (Left) 11/16/2018   Acute hip pain (Left) 11/16/2018   DDD (degenerative disc disease), lumbosacral 11/16/2018   Chronic hip pain (Secondary source of pain) (Bilateral) (L>R) 05/19/2017   Elevated C-reactive protein (CRP) 05/19/2017   Elevated sed rate 05/19/2017   Spondylosis without myelopathy or radiculopathy, lumbosacral region 05/19/2017  Pharmacologic therapy 05/06/2017   Disorder of skeletal system 05/06/2017   Problems influencing health status 05/06/2017   Chest pain with moderate risk of acute coronary syndrome 04/21/2017   DOE (dyspnea on exertion) 04/21/2017   Intermittent palpitations 04/21/2017   Vitamin D insufficiency 05/04/2016   Depression 02/04/2016   Fatty liver 02/04/2016   Hypertension associated with diabetes (HCC) 02/04/2016   Chronic pain syndrome 01/15/2016   Acute low back pain 05/28/2015   Lumbar transverse process fracture (HCC) (Left L1) (03/11/15) 05/28/2015   Long term current use of opiate analgesic 05/01/2015   Long term  prescription opiate use 05/01/2015   Opiate use (25 MME/Day) 05/01/2015   Encounter for therapeutic drug level monitoring 05/01/2015   Encounter for pain management planning 05/01/2015   Chronic low back pain (Primary Source of Pain) (Bilateral) (L>R) 05/01/2015   Lumbar facet syndrome (Bilateral) (L>R) 05/01/2015   Chronic knee pain [facility] source of pain) (Bilateral) (L>R) 05/01/2015   Lumbar spondylosis 05/01/2015   Neuropathic pain 05/01/2015   Neurogenic pain 05/01/2015   Myofascial pain 05/01/2015   Essential hypertriglyceridemia 04/26/2014   Type 2 diabetes mellitus with hyperglycemia, with long-term current use of insulin (HCC) 04/26/2014   Anxiety 05/15/2010   Obesity 05/15/2010   Hyperlipidemia 04/03/2010   Fatty liver disease 04/02/2010   Pure hypercholesterolemia 03/19/2010   GERD 04/17/2009   Constipation 04/17/2009   TOBACCO ABUSE 11/06/2008    ONSET DATE: January 2023  REFERRING DIAG: C71.9 (ICD-10-CM) - Malignant neoplasm of brain, unspecified  THERAPY DIAG:  Muscle weakness (generalized)  Other lack of coordination  Difficulty in walking, not elsewhere classified  Unsteadiness on feet  Rationale for Evaluation and Treatment Rehabilitation  SUBJECTIVE:                                                                                                                                                                                              SUBJECTIVE STATEMENT: Pt reports no updates/nothing new. Pt had her annual and it went well. Pt spouse reports she is on fenofibrate nanocrstallized 145 mg tablet now.  Pt accompanied by:  Kelly Rollins  PAIN:  Are you having pain? 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 Pt's husband, Kelly Rollins, present for evaluation, provides hx 2/2 [name]. Pt reports rt hemiplegia. Pt ambulates short  distances in home with hemi-walker, WC out of home. Previously incompatible with AFO. Pt struggled with re current falls, no significant injury sustained. Pt reports she has constant head pain. Pt spouse reports pt  has pain throughout her whole right side. Pt sleeps in recliner, wakes up stiff in the morning. PMH: anorexia, dehydration, contracture of muscles multiple sites.  PRECAUTIONS: Fall  WEIGHT BEARING RESTRICTIONS No  PATIENT GOALS  "Normal function" as much as possible. Improving her balance.   TODAY'S TREATMENT:   11/03/22   TE: Gait belt donned throughout, PT provides CGA-mod a for the following 4 ft with QC to table STS from increased height mat table 6x min assist throughout, cuing for "nose over toes"  Ambulation for strength/endurance for approx 155ft ft with HW, WC follow, CGA LTL stepping at support surface to R x 14 ft - discontinued due to fatigue  STS from WC 2x mod a     PATIENT EDUCATION: Education details: Pt educated throughout session about proper posture and technique with exercises. Improved exercise technique, movement at target joints, use of target muscles after min to mod verbal, visual, tactile cues. Person educated: pt Education method:Explanation, Demonstration, Tactile cues, and Verbal cues, Education comprehension: verbalized understanding, returned demonstration, tactile cues required, and needs further education   HOME EXERCISE PROGRAM:  pt to continue HEP as given  Access Code: [email or web address] URL: [email or web address] Date: 11/18/2021 Prepared by: Temple Pacini  Exercises - Elbow Extension with Anchored Resistance  - 1 x daily - 5 x weekly - 3 sets - 10 reps  Access Code: [email or web address] URL: [email or web address] Date: 10/02/2021 Prepared by: Maureen Ralphs  Exercises - Seated Hip Flexion March with Ankle Weights  - 1 x daily - 7 x weekly - 3 sets - 10 reps - Seated Long Arc Quad  - 1 x daily - 7 x weekly - 3  sets - 10 reps - Supine Quad Set  - 1 x daily - 7 x weekly - 3 sets - 10 reps - Proper Sit to Stand Technique  - 1 x daily - 7 x weekly - 3 sets - 10 reps  GOALS: Goals reviewed with patient? Yes  SHORT TERM GOALS: Target date: 11/10/2022   Patient will be independent in home exercise program to improve strength/mobility for better functional independence with ADLs. Baseline: to be initiated next 1-2 sessions; 10/17 previously issued, pt is 11/16: to be advanced as pt has gained strength since previous assessment ; 06/09/2022: Pt reports performing her HEP every once in a while; 07/28/22:  improved activity, but not consistent with HEP;8/27/204- Patient reports has not been able to do much exercise due to recent fall with increased overall pain Goal status: ONGOING   LONG TERM GOALS: Target date: 12/22/2022   Patient will demo ability to perform at least 1 STS by pushing from [facility] with LUE instead of pulling to stand to improve functional mobility Baseline: Goal changed as pt FOTO tracked by OT, with new goal: pt currently pulls to stand; 11/16: deferred due to pt feeling unwell; 11/22: able to complete with mod assist; 03/03/2022: unable to complete STS with push to stand technique; 06/09/2022 able to complete with mod assist 1x  Goal status: MET   2.   Patient (< 62 years old) will be able to perform and complete five times sit to stand test from standard chair height in <30 seconds to indicate increased LE strength and improved balance. Baseline: Pt able to complete only 1 stand, insufficient strength currently to complete full test; 10/17 31 seconds pull to stand with UE, close CGA-min a 11/16: deferred due to pt feeling unwell; 03/03/2022: 30 sec pull to stand technique used; 06/09/2022: 24 seconds pull  to stand  Goal status:  MET  3.  Pt will improve LLE strength by at least 1/2 point on MMT in order to increase ease with transfers and functional mobility. Baseline: LLE grossly 4/5 throughout;  10/17: still generally 4/5 B, however now exhibits AROM with R hip flexion and knee ext; 11/16: 4+/5 LLE, RLE 3-/5, noted improvement however in quads and hip flexors; 06/09/2022: LLE strength grossly 4+/5  Goal status: MET  4.  Patient will increase 10 meter walk test to at least 0.80 m/s as to improve gait speed for better community ambulation and to reduce fall risk. Baseline: to be tested next 1-2 visits; 10/17: pt unable to perform 11/16: deferred due to pt feeling unwell; 11/21: 0.17 m/s; 03/03/22: 0.13 m/s with HW; 06/09/2022 0.185 m/s with HW; 07/28/22: 0.13 m/s with HW and WC follow; 09/29/2022= 0.11 m/s Goal status: IN PROGRESS  5.  Pt will complete STS from standard height chair with UUE assist off armrests and with no greater than min assist to improve ease and safety with transfers. Baseline: mod a x2; 10/17: pulling to stand with LUE CGA 11/16: deferred due to pt feeling unwell; 11/21: currently requires mod a to complete; 03/03/2022: pt attempted indep today but unable to complete; 06/09/2022 able to complete with mod assist 1 07/28/22: able to complete with min assist  Goal status: MET,   6.  Pt will improve RLE strength by at least 1/2 point on MMT in order to increase ease with transfers and functional mobility. Baseline: 11/16: 4+/5 LLE, RLE 3-/5, noted improvement however in quads and hip flexors on R; 03/03/2022: 3-/5 RLE, noted increased tone throughout RLE musculature; 06/09/2022: strength still grossly 3-/5 RLE, however R quad strength has improved to 4-/5 ; 07/28/22: strength grossly 3-/5, R quad 4/5  (likely largely due to spasticity); 09/29/2022=3-/5 Right hip flex/ext/knee ext Goal status: IN PROGRESS   7. Patient will ambulate > 300 feet using most appropriate  AD and CGA without rest for improved gait ability with  Household and short community distances.     Baseline: 09/29/2022= 100 feet with HW,     CGA with close w/c follow.    Status: NEW   ASSESSMENT:  CLINICAL IMPRESSION:   Pt shows progress, completing upright activities/gait for majority of session. This indicates improved endurance/functional capacity. The pt would benefit from continued skilled PT to address immobility, LE muscle weakness, and pain in order to decrease fall risk and increase functional mobility and QOL.   OBJECTIVE IMPAIRMENTS Abnormal gait, decreased activity tolerance, decreased balance, decreased coordination, decreased endurance, decreased mobility, difficulty walking, decreased ROM, decreased strength, hypomobility, increased edema, increased fascial restrictions, increased muscle spasms, impaired flexibility, impaired sensation, impaired tone, impaired UE functional use, improper body mechanics, postural dysfunction, and pain.   ACTIVITY LIMITATIONS carrying, lifting, bending, sitting, standing, squatting, stairs, transfers, bed mobility, bathing, toileting, dressing, reach over head, hygiene/grooming, locomotion level, and caring for others  PARTICIPATION LIMITATIONS: meal prep, cleaning, laundry, medication management, personal finances, driving, shopping, community activity, occupation, and yard work  PERSONAL [place], Past/current experiences, Sex, and 3+ comorbidities: Anorexia, dehydration, Adult Failure to thrive, contracture of muscles multiple sites, multiple falls, UTI, extensive PMH refer to chart  are also affecting patient's functional outcome.   REHAB POTENTIAL: Fair    CLINICAL DECISION MAKING: Evolving/moderate complexity  EVALUATION COMPLEXITY: High  PLAN: PT FREQUENCY: 2x/week  PT DURATION: 12 weeks  PLANNED INTERVENTIONS: Therapeutic exercises, Therapeutic activity, Neuromuscular re-education, Balance training, Gait training, Patient/Family education,  Self Care, Joint mobilization, Joint manipulation, Stair training, Vestibular training, Canalith repositioning, Orthotic/Fit training, DME instructions, Wheelchair mobility training, Spinal mobilization,  Cryotherapy, Moist heat, scar mobilization, Splintting, Taping, Manual therapy, and Re-evaluation  PLAN FOR NEXT SESSION:  transfers, gait, balance     Baird Kay, PT 11/03/2022, 5:07 PM  5:07 PM, 11/03/22 Physical Therapist - [facility]  [phone] [facility])   Physical Therapist - [facility]  Outpatient Physical Therapy- Main Campus [phone]

## 2022-11-10 ENCOUNTER — Ambulatory Visit: Payer: Medicare Other

## 2022-11-10 DIAGNOSIS — M6281 Muscle weakness (generalized): Secondary | ICD-10-CM

## 2022-11-10 DIAGNOSIS — R2681 Unsteadiness on feet: Secondary | ICD-10-CM

## 2022-11-10 DIAGNOSIS — M25661 Stiffness of right knee, not elsewhere classified: Secondary | ICD-10-CM

## 2022-11-10 NOTE — Therapy (Signed)
OUTPATIENT PHYSICAL THERAPY TREATMENT   Patient Name: Kelly Rollins MRN: [id number] DOB:Apr 08, 1970, 52 y.o., female Today's Date: 11/10/2022   PCP: Wilford Corner, PA-C REFERRING PROVIDER: Etta Quill, Anette Riedel, PA-C   PT End of Session - 11/10/22 1712     Visit Number 38    Number of Visits 50    Date for PT Re-Evaluation 12/22/22    Authorization Type [employer] Medicare    Authorization Time Period 09/16/21-12/09/21    Progress Note Due on Visit 40    PT Start Time 1620    PT Stop Time 1702    PT Time Calculation (min) 42 min    Equipment Utilized During Treatment Gait belt    Activity Tolerance Patient tolerated treatment well;Patient limited by fatigue    Behavior During Therapy [facility] for tasks assessed/performed                      Past Medical History:  Diagnosis Date   ABDOMINAL PAIN OTHER SPECIFIED SITE 02/21/2009   Qualifier: Diagnosis of  By: Dayton Martes MD, Talia     ABDOMINAL PAIN-LUQ 04/17/2009   Qualifier: Diagnosis of  By: Russella Dar MD Bronson Curb T    Anal fissure    Anginal pain (HCC)    Asthma    Depression    Diabetes mellitus without complication (HCC)    Dyspnea    Dysrhythmia    Headache(784.0)    HEMATOCHEZIA 04/17/2009   Qualifier: Diagnosis of  By: Russella Dar MD Bronson Curb T    Hyperlipidemia    Hypertension    IBS (irritable bowel syndrome)    Migraine headache 11/06/2008   Qualifier: Diagnosis of  By: Dayton Martes MD, Talia     OVARIAN CYST, RIGHT 02/21/2009   Qualifier: Diagnosis of  By: Dayton Martes MD, Talia     PALPITATIONS, OCCASIONAL 04/17/2010   Qualifier: Diagnosis of  By: Dayton Martes MD, Talia     Pancreatitis 10/14/2017   Past Surgical History:  Procedure Laterality Date   BREAST EXCISIONAL BIOPSY Left 1997   ? left side, done at time of reduction , benign   BREAST REDUCTION SURGERY     bilateral   CESAREAN SECTION     CHOLECYSTECTOMY     COLONOSCOPY     ESOPHAGOGASTRODUODENOSCOPY     ESOPHAGOGASTRODUODENOSCOPY (EGD) WITH PROPOFOL  N/A 10/21/2020   Procedure: ESOPHAGOGASTRODUODENOSCOPY (EGD) WITH PROPOFOL;  Surgeon: Regis Bill, MD;  Location: [facility] ENDOSCOPY;  Service: Endoscopy;  Laterality: N/A;   REDUCTION MAMMAPLASTY Bilateral 1997   VAGINAL HYSTERECTOMY     Patient Active Problem List   Diagnosis Date Noted   Abnormal MRI, lumbar spine (01/29/2020) 02/19/2020   Anxiety due to MRI 01/25/2020   Claustrophobia associated to MRI 01/25/2020   Enthesopathy of knee region (Right) 01/25/2020   Enthesopathy of knee region (Left) 01/25/2020   Lumbar facet hypertrophy 01/25/2020   Lumbosacral radiculopathy at S1 (Left) 01/17/2020   Lumbosacral radiculopathy (S1) (Left) 01/17/2020   Chronic lower extremity pain (Bilateral) (L>R) 01/17/2020   History of pancreatitis 07/18/2019   Obstructive sleep apnea syndrome 01/02/2019   Lumbar facet arthropathy (Multilevel) (Bilateral) 12/14/2018   Osteoarthritis involving multiple joints 11/28/2018   Chronic hip pain (Left) 11/16/2018   Acute hip pain (Left) 11/16/2018   DDD (degenerative disc disease), lumbosacral 11/16/2018   Chronic hip pain (Secondary source of pain) (Bilateral) (L>R) 05/19/2017   Elevated C-reactive protein (CRP) 05/19/2017   Elevated sed rate 05/19/2017   Spondylosis without myelopathy or radiculopathy, lumbosacral region  05/19/2017   Pharmacologic therapy 05/06/2017   Disorder of skeletal system 05/06/2017   Problems influencing health status 05/06/2017   Chest pain with moderate risk of acute coronary syndrome 04/21/2017   DOE (dyspnea on exertion) 04/21/2017   Intermittent palpitations 04/21/2017   Vitamin D insufficiency 05/04/2016   Depression 02/04/2016   Fatty liver 02/04/2016   Hypertension associated with diabetes (HCC) 02/04/2016   Chronic pain syndrome 01/15/2016   Acute low back pain 05/28/2015   Lumbar transverse process fracture (HCC) (Left L1) (03/11/15) 05/28/2015   Long term current use of opiate analgesic 05/01/2015   Long  term prescription opiate use 05/01/2015   Opiate use (25 MME/Day) 05/01/2015   Encounter for therapeutic drug level monitoring 05/01/2015   Encounter for pain management planning 05/01/2015   Chronic low back pain (Primary Source of Pain) (Bilateral) (L>R) 05/01/2015   Lumbar facet syndrome (Bilateral) (L>R) 05/01/2015   Chronic knee pain [facility] source of pain) (Bilateral) (L>R) 05/01/2015   Lumbar spondylosis 05/01/2015   Neuropathic pain 05/01/2015   Neurogenic pain 05/01/2015   Myofascial pain 05/01/2015   Essential hypertriglyceridemia 04/26/2014   Type 2 diabetes mellitus with hyperglycemia, with long-term current use of insulin (HCC) 04/26/2014   Anxiety 05/15/2010   Obesity 05/15/2010   Hyperlipidemia 04/03/2010   Fatty liver disease 04/02/2010   Pure hypercholesterolemia 03/19/2010   GERD 04/17/2009   Constipation 04/17/2009   TOBACCO ABUSE 11/06/2008    ONSET DATE: January 2023  REFERRING DIAG: C71.9 (ICD-10-CM) - Malignant neoplasm of brain, unspecified  THERAPY DIAG:  Muscle weakness (generalized)  Stiffness of right knee, not elsewhere classified  Unsteadiness on feet  Rationale for Evaluation and Treatment Rehabilitation  SUBJECTIVE:                                                                                                                                                                                              SUBJECTIVE STATEMENT: Pt presents to PT in powerchair.   Pt accompanied by:  [name]  PAIN:  Are you having pain? 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 Pt's husband, [name], present for evaluation, provides hx 2/2 [name]. Pt reports rt hemiplegia. Pt ambulates short distances in home with hemi-walker, WC out of home. Previously incompatible with AFO. Pt struggled with re current falls, no significant  injury sustained. Pt reports she has constant head pain. Pt spouse reports pt has pain throughout her whole right side. Pt sleeps in recliner, wakes up stiff in the morning. PMH: anorexia,  dehydration, contracture of muscles multiple sites.  PRECAUTIONS: Fall  WEIGHT BEARING RESTRICTIONS No  PATIENT GOALS  "Normal function" as much as possible. Improving her balance.   TODAY'S TREATMENT:   11/10/22    TA: Power chair obstacle course - navigating through cones narrow width and around cones with wide and sharp turns. Cuing throughout for modification of speed, focus on controlled, decreased speed. Pt bumps into cone 1x. Completes multiple rounds of activity.  Instruction/education on use of power chair features for mobility/stretching: -Use of seated leg lifts for sustained hamstring stretch BLE x90 seconds -Use of seated leg lifts for sustained trunk and hamstring stretch x 60 seconds -Instruction in lift feature, then practice of lift to STS to HW 6x - PT provides up to mod assist -Instruction in use of tilt feature for pressure relief on low back     PATIENT EDUCATION: Education details: Pt educated throughout session about proper posture and technique with exercises. Improved exercise technique, movement at target joints, use of target muscles after min to mod verbal, visual, tactile cues. Power chair features Person educated: pt, spouse Education method:Explanation, Demonstration, Tactile cues, and Verbal cues, Education comprehension: verbalized understanding, returned demonstration, tactile cues required, and needs further education   HOME EXERCISE PROGRAM:  pt to continue HEP as given  Update 11/10/22:  *written instructions to perform with power chair using leg lift/ext feature Access Code: [email or web address] URL: [email or web address] Date: 11/10/2022 Prepared by: Temple Pacini  Exercises - Seated Single Leg Hamstring Stretch with AFO - Wheelchair  - 1 x daily - 7  x weekly - 3 sets - 1 reps - 60 seconds hold   Access Code: [email or web address] URL: [email or web address] Date: 11/18/2021 Prepared by: Temple Pacini  Exercises - Elbow Extension with Anchored Resistance  - 1 x daily - 5 x weekly - 3 sets - 10 reps  Access Code: [email or web address] URL: [email or web address] Date: 10/02/2021 Prepared by: Maureen Ralphs  Exercises - Seated Hip Flexion March with Ankle Weights  - 1 x daily - 7 x weekly - 3 sets - 10 reps - Seated Long Arc Quad  - 1 x daily - 7 x weekly - 3 sets - 10 reps - Supine Quad Set  - 1 x daily - 7 x weekly - 3 sets - 10 reps - Proper Sit to Stand Technique  - 1 x daily - 7 x weekly - 3 sets - 10 reps  GOALS: Goals reviewed with patient? Yes  SHORT TERM GOALS: Target date: 11/10/2022   Patient will be independent in home exercise program to improve strength/mobility for better functional independence with ADLs. Baseline: to be initiated next 1-2 sessions; 10/17 previously issued, pt is 11/16: to be advanced as pt has gained strength since previous assessment ; 06/09/2022: Pt reports performing her HEP every once in a while; 07/28/22:  improved activity, but not consistent with HEP;8/27/204- Patient reports has not been able to do much exercise due to recent fall with increased overall pain Goal status: ONGOING   LONG TERM GOALS: Target date: 12/22/2022   Patient will demo ability to perform at least 1 STS by pushing from [facility] with LUE instead of pulling to stand to improve functional mobility Baseline: Goal changed as pt FOTO tracked by OT, with new goal: pt currently pulls to stand; 11/16: deferred due to pt feeling unwell; 11/22: able to complete with mod assist; 03/03/2022: unable to complete STS with push to stand technique; 06/09/2022 able to complete with mod assist 1x  Goal status:  MET   2.   Patient (< 29 years old) will be able to perform and complete five times sit to stand test from standard chair height in <30  seconds to indicate increased LE strength and improved balance. Baseline: Pt able to complete only 1 stand, insufficient strength currently to complete full test; 10/17 31 seconds pull to stand with UE, close CGA-min a 11/16: deferred due to pt feeling unwell; 03/03/2022: 30 sec pull to stand technique used; 06/09/2022: 24 seconds pull to stand  Goal status:  MET  3.  Pt will improve LLE strength by at least 1/2 point on MMT in order to increase ease with transfers and functional mobility. Baseline: LLE grossly 4/5 throughout; 10/17: still generally 4/5 B, however now exhibits AROM with R hip flexion and knee ext; 11/16: 4+/5 LLE, RLE 3-/5, noted improvement however in quads and hip flexors; 06/09/2022: LLE strength grossly 4+/5  Goal status: MET  4.  Patient will increase 10 meter walk test to at least 0.80 m/s as to improve gait speed for better community ambulation and to reduce fall risk. Baseline: to be tested next 1-2 visits; 10/17: pt unable to perform 11/16: deferred due to pt feeling unwell; 11/21: 0.17 m/s; 03/03/22: 0.13 m/s with HW; 06/09/2022 0.185 m/s with HW; 07/28/22: 0.13 m/s with HW and WC follow; 09/29/2022= 0.11 m/s Goal status: IN PROGRESS  5.  Pt will complete STS from standard height chair with UUE assist off armrests and with no greater than min assist to improve ease and safety with transfers. Baseline: mod a x2; 10/17: pulling to stand with LUE CGA 11/16: deferred due to pt feeling unwell; 11/21: currently requires mod a to complete; 03/03/2022: pt attempted indep today but unable to complete; 06/09/2022 able to complete with mod assist 1 07/28/22: able to complete with min assist  Goal status: MET,   6.  Pt will improve RLE strength by at least 1/2 point on MMT in order to increase ease with transfers and functional mobility. Baseline: 11/16: 4+/5 LLE, RLE 3-/5, noted improvement however in quads and hip flexors on R; 03/03/2022: 3-/5 RLE, noted increased tone throughout RLE musculature;  06/09/2022: strength still grossly 3-/5 RLE, however R quad strength has improved to 4-/5 ; 07/28/22: strength grossly 3-/5, R quad 4/5  (likely largely due to spasticity); 09/29/2022=3-/5 Right hip flex/ext/knee ext Goal status: IN PROGRESS   7. Patient will ambulate > 300 feet using most appropriate  AD and CGA without rest for improved gait ability with  Household and short community distances.     Baseline: 09/29/2022= 100 feet with HW,     CGA with close w/c follow.    Status: NEW   ASSESSMENT:  CLINICAL IMPRESSION:  Session dedicated to instruction in use of power chair features and navigating with chair. Pt exhibited good ability to navigate obstacles, but did require some cuing for modulation of speed of chair. Will continue to practice future visits.The pt would benefit from continued skilled PT to address immobility, LE muscle weakness, and pain in order to decrease fall risk and increase functional mobility and QOL.   OBJECTIVE IMPAIRMENTS Abnormal gait, decreased activity tolerance, decreased balance, decreased coordination, decreased endurance, decreased mobility, difficulty walking, decreased ROM, decreased strength, hypomobility, increased edema, increased fascial restrictions, increased muscle spasms, impaired flexibility, impaired sensation, impaired tone, impaired UE functional use, improper body mechanics, postural dysfunction, and pain.   ACTIVITY LIMITATIONS carrying, lifting, bending, sitting, standing, squatting, stairs, transfers, bed mobility, bathing, toileting, dressing, reach  over head, hygiene/grooming, locomotion level, and caring for others  PARTICIPATION LIMITATIONS: meal prep, cleaning, laundry, medication management, personal finances, driving, shopping, community activity, occupation, and yard work  PERSONAL [place], Past/current experiences, Sex, and 3+ comorbidities: Anorexia, dehydration, Adult Failure to thrive, contracture of muscles multiple sites,  multiple falls, UTI, extensive PMH refer to chart  are also affecting patient's functional outcome.   REHAB POTENTIAL: Fair    CLINICAL DECISION MAKING: Evolving/moderate complexity  EVALUATION COMPLEXITY: High  PLAN: PT FREQUENCY: 2x/week  PT DURATION: 12 weeks  PLANNED INTERVENTIONS: Therapeutic exercises, Therapeutic activity, Neuromuscular re-education, Balance training, Gait training, Patient/Family education, Self Care, Joint mobilization, Joint manipulation, Stair training, Vestibular training, Canalith repositioning, Orthotic/Fit training, DME instructions, Wheelchair mobility training, Spinal mobilization, Cryotherapy, Moist heat, scar mobilization, Splintting, Taping, Manual therapy, and Re-evaluation  PLAN FOR NEXT SESSION:  transfers, gait, balance     Baird Kay, PT 11/10/2022, 5:19 PM  5:19 PM, 11/10/22 Physical Therapist - [facility]  [phone] [facility])   Physical Therapist - [facility]  Outpatient Physical Therapy- Main Campus [phone]

## 2022-11-17 ENCOUNTER — Ambulatory Visit: Payer: Medicare Other

## 2022-11-24 ENCOUNTER — Ambulatory Visit: Payer: Medicare Other

## 2022-12-01 ENCOUNTER — Ambulatory Visit: Payer: Medicare Other

## 2022-12-08 ENCOUNTER — Ambulatory Visit: Payer: Medicare Other | Attending: Adult Health Nurse Practitioner

## 2022-12-08 DIAGNOSIS — R262 Difficulty in walking, not elsewhere classified: Secondary | ICD-10-CM | POA: Diagnosis present

## 2022-12-08 DIAGNOSIS — R2681 Unsteadiness on feet: Secondary | ICD-10-CM | POA: Insufficient documentation

## 2022-12-08 DIAGNOSIS — M6281 Muscle weakness (generalized): Secondary | ICD-10-CM | POA: Diagnosis present

## 2022-12-08 DIAGNOSIS — R278 Other lack of coordination: Secondary | ICD-10-CM | POA: Diagnosis present

## 2022-12-08 NOTE — Therapy (Signed)
OUTPATIENT PHYSICAL THERAPY TREATMENT/DISCHARGE   Patient Name: Kelly Rollins MRN: [id number] DOB:[birth year], 52 y.o., female Today's Date: 12/08/2022   PCP: Wilford Corner, PA-C REFERRING PROVIDER: Etta Quill, Anette Riedel, PA-C   PT End of Session - 12/08/22 1453     Visit Number 39    Number of Visits 50    Date for PT Re-Evaluation 12/22/22    Authorization Type [employer] Medicare    Authorization Time Period 09/16/21-12/09/21    Progress Note Due on Visit 40    PT Start Time 1403    PT Stop Time 1446    PT Time Calculation (min) 43 min    Equipment Utilized During Treatment Gait belt    Activity Tolerance Patient tolerated treatment well;Patient limited by fatigue    Behavior During Therapy [facility] for tasks assessed/performed                       Past Medical History:  Diagnosis Date   ABDOMINAL PAIN OTHER SPECIFIED SITE 02/21/2009   Qualifier: Diagnosis of  By: Dayton Martes MD, Talia     ABDOMINAL PAIN-LUQ 04/17/2009   Qualifier: Diagnosis of  By: Russella Dar MD Bronson Curb T    Anal fissure    Anginal pain (HCC)    Asthma    Depression    Diabetes mellitus without complication (HCC)    Dyspnea    Dysrhythmia    Headache(784.0)    HEMATOCHEZIA 04/17/2009   Qualifier: Diagnosis of  By: Russella Dar MD Bronson Curb T    Hyperlipidemia    Hypertension    IBS (irritable bowel syndrome)    Migraine headache 11/06/2008   Qualifier: Diagnosis of  By: Dayton Martes MD, Talia     OVARIAN CYST, RIGHT 02/21/2009   Qualifier: Diagnosis of  By: Dayton Martes MD, Talia     PALPITATIONS, OCCASIONAL 04/17/2010   Qualifier: Diagnosis of  By: Dayton Martes MD, Talia     Pancreatitis 10/14/2017   Past Surgical History:  Procedure Laterality Date   BREAST EXCISIONAL BIOPSY Left 1997   ? left side, done at time of reduction , benign   BREAST REDUCTION SURGERY     bilateral   CESAREAN SECTION     CHOLECYSTECTOMY     COLONOSCOPY     ESOPHAGOGASTRODUODENOSCOPY     ESOPHAGOGASTRODUODENOSCOPY (EGD)  WITH PROPOFOL N/A 10/21/2020   Procedure: ESOPHAGOGASTRODUODENOSCOPY (EGD) WITH PROPOFOL;  Surgeon: Regis Bill, MD;  Location: [facility] ENDOSCOPY;  Service: Endoscopy;  Laterality: N/A;   REDUCTION MAMMAPLASTY Bilateral 1997   VAGINAL HYSTERECTOMY     Patient Active Problem List   Diagnosis Date Noted   Abnormal MRI, lumbar spine (01/29/2020) 02/19/2020   Anxiety due to MRI 01/25/2020   Claustrophobia associated to MRI 01/25/2020   Enthesopathy of knee region (Right) 01/25/2020   Enthesopathy of knee region (Left) 01/25/2020   Lumbar facet hypertrophy 01/25/2020   Lumbosacral radiculopathy at S1 (Left) 01/17/2020   Lumbosacral radiculopathy (S1) (Left) 01/17/2020   Chronic lower extremity pain (Bilateral) (L>R) 01/17/2020   History of pancreatitis 07/18/2019   Obstructive sleep apnea syndrome 01/02/2019   Lumbar facet arthropathy (Multilevel) (Bilateral) 12/14/2018   Osteoarthritis involving multiple joints 11/28/2018   Chronic hip pain (Left) 11/16/2018   Acute hip pain (Left) 11/16/2018   DDD (degenerative disc disease), lumbosacral 11/16/2018   Chronic hip pain (Secondary source of pain) (Bilateral) (L>R) 05/19/2017   Elevated C-reactive protein (CRP) 05/19/2017   Elevated sed rate 05/19/2017   Spondylosis without myelopathy or radiculopathy, lumbosacral  region 05/19/2017   Pharmacologic therapy 05/06/2017   Disorder of skeletal system 05/06/2017   Problems influencing health status 05/06/2017   Chest pain with moderate risk of acute coronary syndrome 04/21/2017   DOE (dyspnea on exertion) 04/21/2017   Intermittent palpitations 04/21/2017   Vitamin D insufficiency 05/04/2016   Depression 02/04/2016   Fatty liver 02/04/2016   Hypertension associated with diabetes (HCC) 02/04/2016   Chronic pain syndrome 01/15/2016   Acute low back pain 05/28/2015   Lumbar transverse process fracture (HCC) (Left L1) (03/11/15) 05/28/2015   Long term current use of opiate analgesic  05/01/2015   Long term prescription opiate use 05/01/2015   Opiate use (25 MME/Day) 05/01/2015   Encounter for therapeutic drug level monitoring 05/01/2015   Encounter for pain management planning 05/01/2015   Chronic low back pain (Primary Source of Pain) (Bilateral) (L>R) 05/01/2015   Lumbar facet syndrome (Bilateral) (L>R) 05/01/2015   Chronic knee pain [facility] source of pain) (Bilateral) (L>R) 05/01/2015   Lumbar spondylosis 05/01/2015   Neuropathic pain 05/01/2015   Neurogenic pain 05/01/2015   Myofascial pain 05/01/2015   Essential hypertriglyceridemia 04/26/2014   Type 2 diabetes mellitus with hyperglycemia, with long-term current use of insulin (HCC) 04/26/2014   Anxiety 05/15/2010   Obesity 05/15/2010   Hyperlipidemia 04/03/2010   Fatty liver disease 04/02/2010   Pure hypercholesterolemia 03/19/2010   GERD 04/17/2009   Constipation 04/17/2009   TOBACCO ABUSE 11/06/2008    ONSET DATE: January 2023  REFERRING DIAG: C71.9 (ICD-10-CM) - Malignant neoplasm of brain, unspecified  THERAPY DIAG:  Muscle weakness (generalized)  Difficulty in walking, not elsewhere classified  Unsteadiness on feet  Other lack of coordination  Rationale for Evaluation and Treatment Rehabilitation  SUBJECTIVE:                                                                                                                                                                                              SUBJECTIVE STATEMENT: Pt presents to PT in powerchair.  Would like to make today last PT visit. Reports difficulty with continuing visits due to high copay.  Pt accompanied by:  [name]  PAIN:  Are you having pain? 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 Pt's husband, [name], present for evaluation, provides hx 2/2 [name]. Pt reports rt hemiplegia. Pt ambulates  short distances in home with hemi-walker, WC out of home. Previously incompatible with AFO. Pt struggled with re current falls, no significant injury sustained. Pt reports she has constant head pain. Pt  spouse reports pt has pain throughout her whole right side. Pt sleeps in recliner, wakes up stiff in the morning. PMH: anorexia, dehydration, contracture of muscles multiple sites.  PRECAUTIONS: Fall  WEIGHT BEARING RESTRICTIONS No  PATIENT GOALS  "Normal function" as much as possible. Improving her balance.   TODAY'S TREATMENT:   12/08/22    TA: Goal reassessment completed for final visit. Please refer to goal section and assessment below for details.  Encouraged pt and spouse to see if she qualifies for [facility] ([facility]) and provided contact information, printout.   Reprinted all previous HEPs and added to/updated last HEP. Discussed/provided education on this during visit. Instructed pt to only perform gait with someone holding gait belt and pt using HW, only perform STS with UE support and someone holding gait belt. Instructed pt to focus most on gait and STS if can only get to one or two interventions. Encouraged pt to continue to practice with power chair as well. Pt and spouse verbalized understanding.   Access Code: [email or web address] URL: [email or web address] Date: 12/08/2022 Prepared by: Temple Pacini  Exercises - Seated Hip Flexion March with Ankle Weights  - 1 x daily - 7 x weekly - 3 sets - 10 reps - Seated Long Arc Quad  - 1 x daily - 7 x weekly - 3 sets - 10 reps - Supine Quad Set  - 1 x daily - 7 x weekly - 3 sets - 10 reps - Proper Sit to Stand Technique  - 1 x daily - 7 x weekly - 3 sets - 10 reps - Walking  - 1 x daily - 5-7 x weekly - 2 sets - 1 reps - 5 minutes hold - Sit to Stand with Counter Support  - 1 x daily - 3 x weekly - 3 sets - 5-10 reps   PATIENT EDUCATION: Education details: Pt educated throughout session about proper posture and  technique with exercises. Improved exercise technique, movement at target joints, use of target muscles after min to mod verbal, visual, tactile cues. HEPs, discharge recommendations, [facility] clinic Person educated: pt, spouse Education method:Explanation, Demonstration, Tactile cues, and Verbal cues, printout of HEPs Education comprehension: verbalized understanding and returned demonstration   HOME EXERCISE PROGRAM:     Update 11/10/22:  *written instructions to perform with power chair using leg lift/ext feature Access Code: [email or web address] URL: [email or web address] Date: 11/10/2022 Prepared by: Temple Pacini  Exercises - Seated Single Leg Hamstring Stretch with AFO - Wheelchair  - 1 x daily - 7 x weekly - 3 sets - 1 reps - 60 seconds hold   Access Code: [email or web address] URL: [email or web address] Date: 11/18/2021 Prepared by: Temple Pacini  Exercises - Elbow Extension with Anchored Resistance  - 1 x daily - 5 x weekly - 3 sets - 10 reps  Access Code: [email or web address] URL: [email or web address] Date: 10/02/2021 Prepared by: Maureen Ralphs  Exercises - Seated Hip Flexion March with Ankle Weights  - 1 x daily - 7 x weekly - 3 sets - 10 reps - Seated Long Arc Quad  - 1 x daily - 7 x weekly - 3 sets - 10 reps - Supine Quad Set  - 1 x daily - 7 x weekly - 3 sets - 10 reps - Proper Sit to Stand Technique  - 1 x daily - 7 x weekly - 3 sets - 10 reps  GOALS: Goals reviewed with patient? Yes  SHORT TERM GOALS: Target date: 11/10/2022   Patient will be independent in home exercise program  to improve strength/mobility for better functional independence with ADLs. Baseline: to be initiated next 1-2 sessions; 10/17 previously issued, pt is 11/16: to be advanced as pt has gained strength since previous assessment ; 06/09/2022: Pt reports performing her HEP every once in a while; 07/28/22:  improved activity, but not consistent with HEP;8/27/204- Patient reports has not  been able to do much exercise due to recent fall with increased overall pain; 12/08/22: pt reports consistently able to complete her exercises Goal status: MET   LONG TERM GOALS: Target date: 12/22/2022   Patient will demo ability to perform at least 1 STS by pushing from [facility] with LUE instead of pulling to stand to improve functional mobility Baseline: Goal changed as pt FOTO tracked by OT, with new goal: pt currently pulls to stand; 11/16: deferred due to pt feeling unwell; 11/22: able to complete with mod assist; 03/03/2022: unable to complete STS with push to stand technique; 06/09/2022 able to complete with mod assist 1x; 12/08/22: Goal status: MET   2.   Patient (< 57 years old) will be able to perform and complete five times sit to stand test from standard chair height in <30 seconds to indicate increased LE strength and improved balance. Baseline: Pt able to complete only 1 stand, insufficient strength currently to complete full test; 10/17 31 seconds pull to stand with UE, close CGA-min a 11/16: deferred due to pt feeling unwell; 03/03/2022: 30 sec pull to stand technique used; 06/09/2022: 24 seconds pull to stand  Goal status:  MET  3.  Pt will improve LLE strength by at least 1/2 point on MMT in order to increase ease with transfers and functional mobility. Baseline: LLE grossly 4/5 throughout; 10/17: still generally 4/5 B, however now exhibits AROM with R hip flexion and knee ext; 11/16: 4+/5 LLE, RLE 3-/5, noted improvement however in quads and hip flexors; 06/09/2022: LLE strength grossly 4+/5  Goal status: MET  4.  Patient will increase 10 meter walk test to at least 0.80 m/s as to improve gait speed for better community ambulation and to reduce fall risk. Baseline: to be tested next 1-2 visits; 10/17: pt unable to perform 11/16: deferred due to pt feeling unwell; 11/21: 0.17 m/s; 03/03/22: 0.13 m/s with HW; 06/09/2022 0.185 m/s with HW; 07/28/22: 0.13 m/s with HW and WC follow; 09/29/2022= 0.11  m/s; 12/08/22: 0.13 m/s with HW and WC follow Goal status: IN PROGRESS  5.  Pt will complete STS from standard height chair with UUE assist off armrests and with no greater than min assist to improve ease and safety with transfers. Baseline: mod a x2; 10/17: pulling to stand with LUE CGA 11/16: deferred due to pt feeling unwell; 11/21: currently requires mod a to complete; 03/03/2022: pt attempted indep today but unable to complete; 06/09/2022 able to complete with mod assist 1 07/28/22: able to complete with min assist  Goal status: MET,   6.  Pt will improve RLE strength by at least 1/2 point on MMT in order to increase ease with transfers and functional mobility. Baseline: 11/16: 4+/5 LLE, RLE 3-/5, noted improvement however in quads and hip flexors on R; 03/03/2022: 3-/5 RLE, noted increased tone throughout RLE musculature; 06/09/2022: strength still grossly 3-/5 RLE, however R quad strength has improved to 4-/5 ; 07/28/22: strength grossly 3-/5, R quad 4/5  (likely largely due to spasticity); 09/29/2022=3-/5 Right hip flex/ext/knee ext Goal status: IN PROGRESS   7. Patient will ambulate > 300 feet using most appropriate  AD and CGA without rest for improved gait ability with  Household and short community distances.     Baseline: 09/29/2022= 100 feet with HW, 12/08/2022: 148 ft total with 2 rest breaks     CGA with close w/c follow.    Status: IN PROGRESS   ASSESSMENT:  CLINICAL IMPRESSION:  Session dedicated to retesting goals as pt reports she needs to make today last PT visit due to difficulty with high copays. PT encouraged pt and her spouse to reach out to [facility] clinic to see if pt would qualify and also reviewed pt's HEPs (see above). Retested as many goals as time permitted. Pt made gains on both and total distance ambulated during visit, indicating improved gait speed/ability and endurance.  While the pt would benefit from further skilled PT, today will be last PT visit. PT provided  education that when pt is able to return to PT she will need new referral.      OBJECTIVE IMPAIRMENTS Abnormal gait, decreased activity tolerance, decreased balance, decreased coordination, decreased endurance, decreased mobility, difficulty walking, decreased ROM, decreased strength, hypomobility, increased edema, increased fascial restrictions, increased muscle spasms, impaired flexibility, impaired sensation, impaired tone, impaired UE functional use, improper body mechanics, postural dysfunction, and pain.   ACTIVITY LIMITATIONS carrying, lifting, bending, sitting, standing, squatting, stairs, transfers, bed mobility, bathing, toileting, dressing, reach over head, hygiene/grooming, locomotion level, and caring for others  PARTICIPATION LIMITATIONS: meal prep, cleaning, laundry, medication management, personal finances, driving, shopping, community activity, occupation, and yard work  PERSONAL [place], Past/current experiences, Sex, and 3+ comorbidities: Anorexia, dehydration, Adult Failure to thrive, contracture of muscles multiple sites, multiple falls, UTI, extensive PMH refer to chart  are also affecting patient's functional outcome.   REHAB POTENTIAL: Fair    CLINICAL DECISION MAKING: Evolving/moderate complexity  EVALUATION COMPLEXITY: High  PLAN: PT FREQUENCY: 2x/week  PT DURATION: 12 weeks  PLANNED INTERVENTIONS: Therapeutic exercises, Therapeutic activity, Neuromuscular re-education, Balance training, Gait training, Patient/Family education, Self Care, Joint mobilization, Joint manipulation, Stair training, Vestibular training, Canalith repositioning, Orthotic/Fit training, DME instructions, Wheelchair mobility training, Spinal mobilization, Cryotherapy, Moist heat, scar mobilization, Splintting, Taping, Manual therapy, and Re-evaluation  PLAN FOR NEXT SESSION:  d/c     Baird Kay, PT 12/08/2022, 3:00 PM  3:00 PM, 12/08/22 Physical Therapist - [facility]  Health [facility]  [phone] [facility])   Physical Therapist - [facility]  Outpatient Physical Therapy- Main Campus [phone]

## 2022-12-15 ENCOUNTER — Ambulatory Visit: Payer: Medicare Other

## 2022-12-22 ENCOUNTER — Ambulatory Visit: Payer: Medicare Other

## 2022-12-29 ENCOUNTER — Ambulatory Visit: Payer: Medicare Other

## 2023-01-05 ENCOUNTER — Ambulatory Visit: Payer: Medicare Other

## 2023-01-11 ENCOUNTER — Other Ambulatory Visit: Payer: Self-pay | Admitting: Physician Assistant

## 2023-01-11 DIAGNOSIS — C719 Malignant neoplasm of brain, unspecified: Secondary | ICD-10-CM

## 2023-01-12 ENCOUNTER — Ambulatory Visit: Payer: Medicare Other

## 2023-01-15 ENCOUNTER — Ambulatory Visit
Admission: RE | Admit: 2023-01-15 | Discharge: 2023-01-15 | Disposition: A | Discharge status: Home / Self Care | Payer: Medicare Other | Source: Ambulatory Visit | Attending: Physician Assistant | Admitting: Physician Assistant

## 2023-01-15 DIAGNOSIS — C719 Malignant neoplasm of brain, unspecified: Secondary | ICD-10-CM | POA: Insufficient documentation

## 2023-01-15 MED ORDER — GADOBUTROL 1 MMOL/ML IV SOLN
7.5000 mL | Freq: Once | INTRAVENOUS | Status: AC | PRN
  Administered 2023-01-15: 7.5 mL via INTRAVENOUS

## 2023-01-19 ENCOUNTER — Ambulatory Visit: Payer: Medicare Other

## 2023-01-28 ENCOUNTER — Ambulatory Visit: Payer: Medicare Other

## 2023-02-02 ENCOUNTER — Ambulatory Visit: Payer: Medicare Other

## 2023-02-09 ENCOUNTER — Ambulatory Visit: Payer: Medicare Other

## 2023-02-16 ENCOUNTER — Ambulatory Visit: Payer: Medicare Other

## 2023-02-23 ENCOUNTER — Ambulatory Visit: Payer: Medicare Other

## 2023-03-02 ENCOUNTER — Ambulatory Visit: Payer: Medicare Other

## 2023-03-09 ENCOUNTER — Ambulatory Visit: Payer: Medicare Other

## 2023-03-16 ENCOUNTER — Ambulatory Visit: Payer: Medicare Other

## 2023-03-23 ENCOUNTER — Ambulatory Visit: Payer: Medicare Other

## 2023-03-30 ENCOUNTER — Ambulatory Visit: Payer: Medicare Other

## 2023-04-06 ENCOUNTER — Ambulatory Visit: Payer: Medicare Other

## 2023-04-13 ENCOUNTER — Ambulatory Visit: Payer: Medicare Other

## 2023-04-20 ENCOUNTER — Ambulatory Visit: Payer: Medicare Other

## 2023-05-31 ENCOUNTER — Other Ambulatory Visit: Payer: Self-pay | Admitting: Family Medicine

## 2023-05-31 DIAGNOSIS — Z1231 Encounter for screening mammogram for malignant neoplasm of breast: Secondary | ICD-10-CM
